# Patient Record
Sex: Male | Born: 1945 | Race: White | Hispanic: No | Marital: Single | Smoking: Current every day smoker
Health system: Southern US, Community
[De-identification: ages and names within clinical notes are randomized; demographics above are authoritative.]

## PROBLEM LIST (undated history)

## (undated) DIAGNOSIS — D649 Anemia, unspecified: Secondary | ICD-10-CM

## (undated) DIAGNOSIS — K2981 Duodenitis with bleeding: Secondary | ICD-10-CM

## (undated) DIAGNOSIS — Z923 Personal history of irradiation: Secondary | ICD-10-CM

## (undated) DIAGNOSIS — K900 Celiac disease: Secondary | ICD-10-CM

## (undated) DIAGNOSIS — R0602 Shortness of breath: Secondary | ICD-10-CM

## (undated) DIAGNOSIS — K219 Gastro-esophageal reflux disease without esophagitis: Secondary | ICD-10-CM

## (undated) DIAGNOSIS — I10 Essential (primary) hypertension: Secondary | ICD-10-CM

## (undated) DIAGNOSIS — J449 Chronic obstructive pulmonary disease, unspecified: Secondary | ICD-10-CM

## (undated) DIAGNOSIS — C801 Malignant (primary) neoplasm, unspecified: Secondary | ICD-10-CM

## (undated) DIAGNOSIS — R569 Unspecified convulsions: Secondary | ICD-10-CM

## (undated) DIAGNOSIS — R001 Bradycardia, unspecified: Secondary | ICD-10-CM

## (undated) DIAGNOSIS — I6521 Occlusion and stenosis of right carotid artery: Secondary | ICD-10-CM

## (undated) DIAGNOSIS — I209 Angina pectoris, unspecified: Secondary | ICD-10-CM

## (undated) DIAGNOSIS — I509 Heart failure, unspecified: Secondary | ICD-10-CM

## (undated) DIAGNOSIS — K269 Duodenal ulcer, unspecified as acute or chronic, without hemorrhage or perforation: Secondary | ICD-10-CM

## (undated) DIAGNOSIS — Z950 Presence of cardiac pacemaker: Secondary | ICD-10-CM

## (undated) DIAGNOSIS — R011 Cardiac murmur, unspecified: Secondary | ICD-10-CM

## (undated) HISTORY — DX: Duodenal ulcer, unspecified as acute or chronic, without hemorrhage or perforation: K26.9

## (undated) HISTORY — DX: Anemia, unspecified: D64.9

## (undated) HISTORY — PX: MOLE REMOVAL: SHX2046

## (undated) HISTORY — PX: HEMORRHOID SURGERY: SHX153

## (undated) HISTORY — PX: HERNIA REPAIR: SHX51

## (undated) HISTORY — DX: Duodenitis with bleeding: K29.81

## (undated) NOTE — *Deleted (*Deleted)
During

---

## 2000-08-13 ENCOUNTER — Encounter: Admission: RE | Admit: 2000-08-13 | Discharge: 2000-08-13 | Payer: Self-pay | Admitting: Surgery

## 2000-08-13 ENCOUNTER — Encounter: Payer: Self-pay | Admitting: Surgery

## 2000-08-15 ENCOUNTER — Ambulatory Visit (HOSPITAL_BASED_OUTPATIENT_CLINIC_OR_DEPARTMENT_OTHER): Admission: RE | Admit: 2000-08-15 | Discharge: 2000-08-15 | Payer: Self-pay | Admitting: Surgery

## 2003-07-30 ENCOUNTER — Emergency Department (HOSPITAL_COMMUNITY): Admission: EM | Admit: 2003-07-30 | Discharge: 2003-07-30 | Payer: Self-pay | Admitting: Emergency Medicine

## 2004-09-10 ENCOUNTER — Inpatient Hospital Stay (HOSPITAL_COMMUNITY): Admission: EM | Admit: 2004-09-10 | Discharge: 2004-09-12 | Payer: Self-pay | Admitting: Emergency Medicine

## 2004-09-17 ENCOUNTER — Encounter: Admission: RE | Admit: 2004-09-17 | Discharge: 2004-09-17 | Payer: Self-pay | Admitting: Family Medicine

## 2005-11-19 ENCOUNTER — Inpatient Hospital Stay (HOSPITAL_COMMUNITY): Admission: EM | Admit: 2005-11-19 | Discharge: 2005-11-21 | Payer: Self-pay | Admitting: Emergency Medicine

## 2005-11-20 ENCOUNTER — Encounter (INDEPENDENT_AMBULATORY_CARE_PROVIDER_SITE_OTHER): Payer: Self-pay | Admitting: *Deleted

## 2005-11-24 ENCOUNTER — Ambulatory Visit (HOSPITAL_COMMUNITY): Admission: RE | Admit: 2005-11-24 | Discharge: 2005-11-24 | Payer: Self-pay | Admitting: Pediatrics

## 2009-11-10 ENCOUNTER — Ambulatory Visit (HOSPITAL_COMMUNITY): Admission: RE | Admit: 2009-11-10 | Discharge: 2009-11-10 | Payer: Self-pay | Admitting: Gastroenterology

## 2009-11-15 ENCOUNTER — Encounter: Admission: RE | Admit: 2009-11-15 | Discharge: 2009-11-15 | Payer: Self-pay | Admitting: Gastroenterology

## 2010-11-26 ENCOUNTER — Encounter: Payer: Self-pay | Admitting: *Deleted

## 2011-03-24 NOTE — Op Note (Signed)
[place]  Patient:    Francisco [name], ROUDEBUSH                      MRN: [id number] Proc. Date: 08/15/00 Adm. Date:  [id number] Attending:  Cristela Blue CC:         Eulas Post, M.D.   Operative Report  DATE OF BIRTH:  1946-02-19.  CCS:  [id number]  PREOPERATIVE DIAGNOSIS:  Umbilical hernia.  POSTOPERATIVE DIAGNOSIS:  Proximate 8-9/2 cm umbilical hernia.  PROCEDURE:  Umbilical herniorrhaphy.  SURGEON:  Dr. Lucia Gaskins.  FIRST ASSISTANT:  None.  ANESTHESIA:  General LMA with approximately 20 cc of 0.25% Marcaine.  COMPLICATIONS:  None.  INDICATIONS FOR PROCEDURE:  Mr. Albea is a 65 year old white male who has a symptomatic umbilical hernia and comes for repair of this hernia.  I have instructed him on 2 things he needs to try to do:  1. He needs to try to lose weight 2. He needs to quit smoking as far as trying to permit this hernia from coming    back.  DESCRIPTION OF PROCEDURE:  The patient was placed in a supine position, given a general anesthesia using a LMA airway access.  His abdomen was prepped with Betadine solution and sterilely draped.  An infraumbilical incision was made and sharp dissection carried down identifying the umbilical hernia.  The sac was dissected of the umbilicus and sharp dissection carried down to identify about a 2-1/2 cm defect in the fascia.  The sac was invaginated, the edges freshened up, and I closed the hernia defect using interrupted 1 Novofil sutures.  I then used a 0.25% Marcaine about 20 cc as a local anesthetic, closed the subcutaneous tissues with 3-0 Vicryl, skin with a 5-0 Vicryl suture, painted the wound with tincture of benzoin and Steri-Strips.  The patient tolerated the procedure well and will be discharged home today to return to see me in 10-14 days for followup. DD:  08/15/00 TD:  08/16/00 Job: [id number]

## 2011-03-24 NOTE — H&P (Signed)
NAME:  Francisco [name], Francisco [name] NO.:  [id number]   MEDICAL RECORD NO.:  [id number]          PATIENT TYPE:  EMS   LOCATION:  MAJO                         FACILITY:  [facility]   PHYSICIAN:  Ron Parker, M.D.DATE OF BIRTH:  [birth year]   DATE OF ADMISSION:  09/10/2004  DATE OF DISCHARGE:                                HISTORY & PHYSICAL   PRIMARY CARE PHYSICIAN:  Dr. [name].   CHIEF COMPLAINT:  Right arm tingling and numbness.   PRESENT ILLNESS:  This is a 65 year old male with no consistent medical care  in the past who presents to his primary care physician's office this morning  after onset of right arm tingling that began yesterday evening.  He states  this morning that the tingling has progressed further to encompass his  entire right arm and now his left leg.  He denies any strength deficits.  He  denies any visual or auditory deficits.  No slurred speech.  He has not  fallen or had any unsteadiness to his gait.  He denies any previous history  of similar symptoms.  He states that he does not have routine medical care  and therefore was not told he has any other medical illnesses.   ALLERGIES:  None.   MEDICATIONS:  Naprosyn two tablet daily.  This is taken over-the-counter for  left heel pain.   PAST MEDICAL HISTORY:  None as he does not have routine medical care.   SOCIAL HISTORY:  He is a Curator, lives in [place], denies any illicit  drugs but has used tobacco and alcohol between 30-40 years.   FAMILY HISTORY:  His father passed away at the age of 55 with Hodgkin's  disease.  He has brothers and sisters who have no medical illnesses.  He is  not married and has no kids.   REVIEW OF SYSTEMS:  No headaches, blurred vision, auditory deficits.  He  denies any chills, cough, only in the mornings with minimal production of  sputum.  He has not seen any blood in that sputum.  No hematemesis,  hemoptysis.  He has no dyspnea on exertion.  No chest pain,  palpitations.  No orthopnea.  He denies any abdominal pain.  No problems with bowel  movements.  No blood stools or melanotic stools.  He denies any problems  with strength or joint pain except for left heel which he states broke a  long time ago.  He denies any lower extremity swelling and prior to  presentation, denies any previous symptoms of tingling or numbness.  No  dysuria and no problems and no STD's.   PHYSICAL EXAMINATION:  VITAL SIGNS:  Temperature 98.7, pulse of 85,  respirations 18, blood pressure 160/85.  GENERAL:  He is in no acute distress laying on the examining table.  HEENT:  Unremarkable.  NECK:  Supple with no masses, no JVP, no audible bruits.  CHEST:  Clear to auscultation bilaterally with good air movement.  CARDIAC:  Exam is a regular rate and rhythm.  No murmurs, rubs or gallops.  PMI is not displaced.  ABDOMEN:  Obese, nondistended,  nontender.  Positive bowel sounds.  Liver is  palpated at approximately 2-3 cm below the costal margin.  Question whether  there is a rough edge during deep palpation.  No tenderness noted.  Otherwise no organomegaly.  EXTREMITIES:  Without edema or clubbing.  Pulses are 2+ upper and lower and  are symmetric.  SKIN:  No telangiectasias.  No other rashes noted.  NEUROLOGICAL:  Cranial nerves II-XII are intact.  Sensation is diminished on  the right arm and right leg.  A two point discrimination is decreased  considerably and is at approximately 4-5 cm for discrimination.  Otherwise,  strength is intact, 5/5 and symmetric.  He is alert and oriented x3.  Reflexes are intact throughout.   LABORATORY DATA:  CT scan of head without contrast shows old lacunar infarct  in the thalamus region on the left side.  No acute changes.  Chest x-ray  shows no cardiac enlargement and clear lung fields.   White count 7.3, hemoglobin 14, platelets of 300,000.   Sodium 135, potassium 4, chloride of 106, bicarb of 22, glucose of 110, BUN  of 11,  creatinine of 0.8.  His liver function tests are normal.  PT is 12.1  and INR is 0.9.   ASSESSMENT AND PLAN:  1.  Numbness and tingling.  The patient during the exam states that his      symptoms are slowly abating from his first entering the emergency room.      Therefore, this seems more as a transient ischemic attack but will keep      him for observation in case this is happens to be a starting of a      cerebrovascular accident.  For the time being, we will control his      hypertension and aspirin and watch him closely with the every two hour      checks the first 12 hours.  Will also check homocystine level and      fasting lipid profiles and carotid Dopplers.  Given no previous medical      information and probable longstanding hypertension, he is at high risk      for a possible cardiac-related ischemic event such as an embolic event,      so therefore we will check a 2D echocardiogram and also and EKG.  No      clinical picture is that would fit a myocardial infarction therefore,      enzymes will not be checked right now.  2.  Hypertension, longstanding.  We will start him on hydrochlorothiazide      and adjust as needed.  3.  Alcohol abuse.  Will provide him with cessation counseling.  4.  Tobacco abuse.  Also provide cessation counseling.       JD/MEDQ  D:  09/10/2004  T:  09/10/2004  Job:  [id number]   cc:   Youlanda Roys. [name], M.D.  [address]  [place]  [place] [address]  Fax: [phone]

## 2011-03-24 NOTE — Discharge Summary (Signed)
NAMECARSTEN, CARSTARPHEN               ACCOUNT NO.:  [id number]   MEDICAL RECORD NO.:  [id number]          PATIENT TYPE:  INP   LOCATION:  4707                         FACILITY:  [facility]   PHYSICIAN:  Pramod P. Leonie Man, MD    DATE OF BIRTH:  [birth year]   DATE OF ADMISSION:  11/19/2005  DATE OF DISCHARGE:  11/21/2005                                 DISCHARGE SUMMARY   ADMISSION DIAGNOSIS:  Transient ischemic attack.   DISCHARGE DIAGNOSES:  1.  Right-sided progressive numbness of unclear etiology, perhaps stress      related.  2.  Hypertension.  3.  Obesity.   HOSPITAL COURSE:  Kindly see Dr. Pollyann Savoy admission H&P for details.  Mr.  Montanari is a 65 year old gentleman who was admitted with progressive right  upper extremity and lower extremity paresthesias which evolved over a period  of 30 minutes and resolve completely.  He had a similar episode in 2005.  At  that time he had an extensive workup for stroke which was all negative.  The  patient reported being under increased stress recently with the death of a  close friend a few days prior to these symptoms.  He does have vascular risk  factors in the form of hypertension, obesity and smoking and hence he was  admitted for stroke risk stratification.  Admission CT scan was  unremarkable.  Subsequently MRI scan of the brain was obtained which also  did not reveal any acute infarct.  MRA of the brain did not reveal any high-  grade stenosis. Carotid ultrasound was normal without significant stenosis.  A 2-D echocardiogram revealed no obvious cardiac source of embolism with  normal ejection fraction.  Lipid profile showed total cholesterol of 151,  triglycerides 104, HDL 57, LDL 73.  Homocystine was elevated at 17.  The  patient was advised to quit smoking and lose weight. He was advised to  participate in stress laxation activities.  He was continued on his home  medications.   DISCHARGE MEDICATIONS:  1.  Aspirin 325 mg a day.  2.   Benicar/hydrochlorothiazide 40/25 one tablet daily.   DISCHARGE INSTRUCTIONS:  He was advised to follow-up with his primary doctor  in two weeks.           ______________________________  Kathie Rhodes. Leonie Man, MD     PPS/MEDQ  D:  11/21/2005  T:  11/21/2005  Job:  [id number]

## 2011-03-24 NOTE — H&P (Signed)
[name], [name] NO.:  [id number]   MEDICAL RECORD NO.:  [id number]          PATIENT TYPE:  INP   LOCATION:  4707                         FACILITY:  [facility]   PHYSICIAN:  Francisco [name], M.D.DATE OF BIRTH:  Mar 27, 1946   DATE OF ADMISSION:  11/19/2005  DATE OF DISCHARGE:                                HISTORY & PHYSICAL   REQUESTING PHYSICIAN:  Francisco [name], M.D.   REASON FOR CONSULTATION:  Stroke/TIA.   HISTORY OF PRESENT ILLNESS:  Francisco [name] is a 65 year old Caucasian male with  past medical history of hypertension and stroke in 2005 who presents with  right-sided numbness with onset at 9:30 a.m. today.  The patient stated  symptoms started involving the right upper extremity and hand and progressed  to part of the right face and lower extremity.  The patient stated after  arriving to the emergency room, his symptoms gradually improved and he is  almost back to normal with still some residual right hand numbness.  These  are similar symptoms to the patient's prior stroke that he had back in  November 2005.  He denies any other symptoms of headaches, weakness, vision  changes, speech, hearing or swallowing problems, chewing problems,  dizziness, vertigo or loss of consciousness.   PAST MEDICAL HISTORY:  Positive for stroke in 2005 and hypertension.   CURRENT MEDICATIONS:  Benicar/hydrochlorothiazide and a baby aspirin.   ALLERGIES:  THE PATIENT HAS NO KNOWN DRUG ALLERGIES.   SOCIAL HISTORY:  The patient currently lives alone.  Smokes less than a pack  of cigarettes per day for over 40 years.  He does drink six to eight beers  per day.   FAMILY HISTORY:  Positive for Hodgkin's disease.   REVIEW OF SYSTEMS:  Positives as per HPI and anxiety.  Review of systems is  negative as per HPI and greater than eight other organ systems.   EXAMINATION:  VITALS:  Temperature is 97.6.  Blood pressure is 156/85.  Pulse is 97.  Respirations 22.  Oxygen saturation  97% on room air.  HEENT:  Normocephalic, atraumatic.  Extraocular muscles are intact.  Pupils  equal, round and reactive to light.  NECK:  Supple.  No carotid bruits.  HEART:  Regular.  LUNGS:  Clear.  ABDOMEN:  Soft, nontender.  EXTREMITIES:  No edema.  Good pulses.  NEUROLOGICAL EXAMINATION:  The patient is alert and oriented times three.  Seems slightly anxious.  Language is fluent.  Memory and knowledge are  within normal limits.  Cranial nerves II through XII are grossly intact.  Motor examination shows 5/5 strength and normal tone in all four  extremities.  No drift is noted.  On sensory examination, the patient has  slightly decreased sensation in right upper extremity, primarily of the  hand.  Otherwise, sensation is within normal limits to light touch and  pinprick.  Cerebellar function is within normal limits on finger-to-nose.  Gait is not assessed secondary to safety.   LABORATORIES:  Currently pending.  EKG showed normal sinus rhythm at 85  beats per minute.  CT scan of the head  showed no acute findings or bleed.  There is old left thalamic/globus pallidus infarct noted.   IMPRESSION:  Francisco [name] is a 65 year old Caucasian male who presented with  right-sided numbness, onset at 9:30 a.m.  His symptoms have gradually  improved.  This is possible transient ischemic attack versus a small  extension of his previous stroke.  The patient is not a candidate for IV tPA  as symptoms are greater than three hours out from onset and the patient has  improved almost back to baseline.  Will admit the patient to stroke MD  service and get an MRI/MRA of the brain, carotid Dopplers and 2D echo.  Will  also check fasting lipids and homocystine level.  Will place the patient  n.p.o. until he is cleared.  Will put him on normal saline at 100 cc per  hour.  Will get PT, OT and speech consults.  Will change his aspirin to  Aggrenox one tablet b.i.d. and continue blood pressure medication.  I  have  also ordered 1 mg of Ativan p.r.n. every six hours for anxiety and possible  withdrawal symptoms from alcohol.      Francisco [name]. Estella Husk, M.D.  Electronically Signed     DRC/MEDQ  D:  11/19/2005  T:  11/20/2005  Job:  [id number]

## 2011-03-24 NOTE — Procedures (Signed)
EEG NUMBER:  [id number].   REFERRING PHYSICIAN:  Pramod P. Leonie Man, M.D.   CLINICAL HISTORY:  A 65 year old gentleman being evaluated for possible  stroke. Medications are not listed.   EEG performed in wakeful state on a 17-channel EEG machine.   Background awake rhythm consists of 11 hertz alpha which is of moderate  amplitude, synchronous, reactive to eye opening and closure. No paroxysmal  epileptiform activity, spikes or sharp waves are seen. Definite sleep  changes are not seen in this tracing. Hyperventilation and photic  stimulation are unremarkable. Length of the tracing was 22.8 minutes.  Technical component is adequate. EKG tracing reveals regular sinus rhythm.   IMPRESSION:  This EEG performed during wakeful states is within normal  limits. No definite epileptiform activity is identified.           ______________________________  Kathie Rhodes. Leonie Man, MD     XBD:ZHGD  D:  11/24/2005 19:43:31  T:  11/25/2005 07:12:54  Job #:  [id number]

## 2011-03-24 NOTE — Discharge Summary (Signed)
Francisco [name], GOREE NO.:  [id number]   MEDICAL RECORD NO.:  [id number]          PATIENT TYPE:  INP   LOCATION:  5707                         FACILITY:  [facility]   PHYSICIAN:  Cyril Mourning, D.O.    DATE OF BIRTH:  [birth year]   DATE OF ADMISSION:  09/10/2004  DATE OF DISCHARGE:  09/12/2004                                 DISCHARGE SUMMARY   ADMISSION DIAGNOSIS:  Transient neurologic event.   DISCHARGE DIAGNOSIS:  Acute left thalamic infarct, status post MRI  evaluation that appears to be lacunar stroke involving left lateral  thalamus.  He has no residual deficit and his presenting complaint is that  of right-sided numbness and tingling that had resolved.   ADDITIONAL DIAGNOSES:  1.  Hypertension, poorly controlled.  Altace and hydrochlorothiazide      initiated this admission.  2.  Tobacco dependence.   DISCHARGE MEDICATIONS:  1.  Altace 2.5 mg daily.  This is new.  2.  Aspirin 325 mg daily.  3.  Hydrochlorothiazide 12.5 mg daily.   DISPOSITION:  The patient is instructed to have an MRA as an outpatient.  I  am attempting to schedule this while he is here in the hospital and have him  follow up.  This is to help reaffirm this is a lacunar stroke and there is  no intracranial disease that may be amenable to intervention.  He has  undergone carotid Dopplers this admission in addition to MRI of his brain  and CT scanning as well as 2-D echo.  The results of his 2-D echo are  pending; however, his carotid studies reveal no significant internal carotid  artery stenosis and vertebral artery flow was antegrade.  He underwent  fasting lipid profile that revealed an LDL of 88, cholesterol of 162 and  triglyceride of 115.  His basic metabolic panel and CBC were within normal  limits.  His homocystine level was borderline.  It was 12.98 with normal  being 5 to 13.9.  Hemoglobin A1C was 6.1.  He is instructed to follow up  with Dr. Deatra [name] at 10 a.m. tomorrow, September 13, 2004.  Again, he is being  scheduled to have an MRA as an outpatient.  Leave follow-up of response to  Altace and hydrochlorothiazide to his primary care physician.   HISTORY OF PRESENT ILLNESS:  For full details, see H&P as dictated by Dr.  Oval Linsey.  Briefly, this is a 65 year old male with no consistent medical  care in the past who presented to his primary care physician's office after  onset of right arm tingling that began the evening prior to presentation.  He stated that the morning of presentation, the tingling had progressed  further to involve his arm and his leg.  He denied any strength deficits.  He denied any slurred speech.  He denied any falls.  He had no previous  history of similar symptoms.  In the emergency department initial CT scan  revealed that of an old infarct.   HOSPITAL COURSE:  The patient was admitted and underwent risk factor  evaluation in addition to  carotid Dopplers, 2-D echo and MRI.  The MRI  revealed acute infarct as noted above, felt to be lacunar and related to his  hypertension.  He was initiated on Altace and hydrochlorothiazide and at  this time he is felt to be medically stable, has no residual neurologic  deficits.  At this time, his blood pressure appears to be controlled.  I did  speak with a neurologist, Dr. Jannifer Franklin, who provided recommendations and  ineffective was pursuing further studies including MRA  of his head to rule out vascular disease that would be amenable to  intervention.  However, at this time it is expected this is most likely  related to long-standing hypertension and small vessel disease.  At this  time, he is medically stable for discharge.  I am discharging him to home  with follow-up tomorrow with his primary care physician.       ESS/MEDQ  D:  09/12/2004  T:  09/12/2004  Job:  [id number]   cc:   Youlanda Roys. Francisco [name], M.D.  [address]  [place]   [address]  Fax: [phone]

## 2012-06-13 ENCOUNTER — Encounter (HOSPITAL_COMMUNITY): Payer: Self-pay | Admitting: Emergency Medicine

## 2012-06-13 ENCOUNTER — Inpatient Hospital Stay (HOSPITAL_COMMUNITY)
Admission: EM | Admit: 2012-06-13 | Discharge: 2012-06-14 | DRG: 812 | Disposition: A | Discharge status: Home / Self Care | Payer: Medicare HMO | Attending: Internal Medicine | Admitting: Internal Medicine

## 2012-06-13 DIAGNOSIS — I10 Essential (primary) hypertension: Secondary | ICD-10-CM | POA: Diagnosis present

## 2012-06-13 DIAGNOSIS — E871 Hypo-osmolality and hyponatremia: Secondary | ICD-10-CM | POA: Diagnosis present

## 2012-06-13 DIAGNOSIS — K900 Celiac disease: Secondary | ICD-10-CM | POA: Diagnosis present

## 2012-06-13 DIAGNOSIS — R5381 Other malaise: Secondary | ICD-10-CM

## 2012-06-13 DIAGNOSIS — D649 Anemia, unspecified: Secondary | ICD-10-CM

## 2012-06-13 DIAGNOSIS — D509 Iron deficiency anemia, unspecified: Principal | ICD-10-CM | POA: Diagnosis present

## 2012-06-13 DIAGNOSIS — R531 Weakness: Secondary | ICD-10-CM | POA: Diagnosis present

## 2012-06-13 HISTORY — DX: Essential (primary) hypertension: I10

## 2012-06-13 HISTORY — DX: Shortness of breath: R06.02

## 2012-06-13 HISTORY — DX: Hemochromatosis due to repeated red blood cell transfusions: E83.111

## 2012-06-13 HISTORY — DX: Cardiac murmur, unspecified: R01.1

## 2012-06-13 HISTORY — DX: Celiac disease: K90.0

## 2012-06-13 HISTORY — DX: Gastro-esophageal reflux disease without esophagitis: K21.9

## 2012-06-13 LAB — COMPREHENSIVE METABOLIC PANEL
ALT: 40 U/L (ref 0–53)
AST: 48 U/L — ABNORMAL HIGH (ref 0–37)
Albumin: 3.8 g/dL (ref 3.5–5.2)
Alkaline Phosphatase: 54 U/L (ref 39–117)
BUN: 14 mg/dL (ref 6–23)
CO2: 24 mEq/L (ref 19–32)
Calcium: 9.4 mg/dL (ref 8.4–10.5)
Chloride: 90 mEq/L — ABNORMAL LOW (ref 96–112)
Creatinine, Ser: 1.16 mg/dL (ref 0.50–1.35)
GFR calc Af Amer: 74 mL/min — ABNORMAL LOW (ref >90)
GFR calc non Af Amer: 64 mL/min — ABNORMAL LOW (ref >90)
Glucose, Bld: 121 mg/dL — ABNORMAL HIGH (ref 70–99)
Potassium: 3.9 mEq/L (ref 3.5–5.1)
Sodium: 126 mEq/L — ABNORMAL LOW (ref 135–145)
Total Bilirubin: 0.4 mg/dL (ref 0.3–1.2)
Total Protein: 7.1 g/dL (ref 6.0–8.3)

## 2012-06-13 LAB — CBC WITH DIFFERENTIAL/PLATELET
Basophils Absolute: 0 10*3/uL (ref 0.0–0.1)
Basophils Relative: 0 % (ref 0–1)
Eosinophils Absolute: 0.1 10*3/uL (ref 0.0–0.7)
Eosinophils Relative: 3 % (ref 0–5)
HCT: 19.8 % — ABNORMAL LOW (ref 39.0–52.0)
Hemoglobin: 6.2 g/dL — CL (ref 13.0–17.0)
Lymphocytes Relative: 8 % — ABNORMAL LOW (ref 12–46)
Lymphs Abs: 0.5 10*3/uL — ABNORMAL LOW (ref 0.7–4.0)
MCH: 21.5 pg — ABNORMAL LOW (ref 26.0–34.0)
MCHC: 31.3 g/dL (ref 30.0–36.0)
MCV: 68.5 fL — ABNORMAL LOW (ref 78.0–100.0)
Monocytes Absolute: 0.6 10*3/uL (ref 0.1–1.0)
Monocytes Relative: 11 % (ref 3–12)
Neutro Abs: 4.3 10*3/uL (ref 1.7–7.7)
Neutrophils Relative %: 78 % — ABNORMAL HIGH (ref 43–77)
Platelets: 558 10*3/uL — ABNORMAL HIGH (ref 150–400)
RBC: 2.89 MIL/uL — ABNORMAL LOW (ref 4.22–5.81)
RDW: 19.4 % — ABNORMAL HIGH (ref 11.5–15.5)
WBC: 5.6 10*3/uL (ref 4.0–10.5)

## 2012-06-13 LAB — RETICULOCYTES
RBC.: 3.34 MIL/uL — ABNORMAL LOW (ref 4.22–5.81)
Retic Count, Absolute: 46.8 10*3/uL (ref 19.0–186.0)
Retic Ct Pct: 1.4 % (ref 0.4–3.1)

## 2012-06-13 LAB — ABO/RH: ABO/RH(D): O POS

## 2012-06-13 LAB — PROTIME-INR
INR: 1.06 (ref 0.00–1.49)
Prothrombin Time: 14 seconds (ref 11.6–15.2)

## 2012-06-13 LAB — APTT: aPTT: 30 seconds (ref 24–37)

## 2012-06-13 LAB — POCT I-STAT TROPONIN I: Troponin i, poc: 0.01 ng/mL (ref 0.00–0.08)

## 2012-06-13 LAB — PREPARE RBC (CROSSMATCH)

## 2012-06-13 LAB — CALCIUM: Calcium: 9.4 mg/dL (ref 8.4–10.5)

## 2012-06-13 MED ORDER — SODIUM CHLORIDE 0.9 % IV SOLN: INTRAVENOUS | Status: DC

## 2012-06-13 MED ORDER — SODIUM CHLORIDE 0.9 % IJ SOLN: 3.0000 mL | INTRAMUSCULAR | Status: DC | PRN

## 2012-06-13 MED ORDER — AMLODIPINE BESYLATE 5 MG PO TABS
5.0000 mg | ORAL_TABLET | Freq: Every day | ORAL | Status: DC
Start: 2012-06-13 — End: 2012-06-14
  Administered 2012-06-13 – 2012-06-14 (×2): 5 mg via ORAL
  Filled 2012-06-13 (×2): qty 1

## 2012-06-13 MED ORDER — POLYETHYLENE GLYCOL 3350 17 G PO PACK
17.0000 g | PACK | Freq: Every day | ORAL | Status: DC | PRN
  Administered 2012-06-14: 17 g via ORAL
  Filled 2012-06-13 (×2): qty 1

## 2012-06-13 MED ORDER — ONDANSETRON HCL 4 MG PO TABS: 4.0000 mg | ORAL_TABLET | Freq: Four times a day (QID) | ORAL | Status: DC | PRN

## 2012-06-13 MED ORDER — ONDANSETRON HCL 4 MG/2ML IJ SOLN: 4.0000 mg | Freq: Four times a day (QID) | INTRAMUSCULAR | Status: DC | PRN

## 2012-06-13 MED ORDER — SODIUM CHLORIDE 0.9 % IV SOLN
Freq: Once | INTRAVENOUS | Status: AC
  Administered 2012-06-13: 125 mL/h via INTRAVENOUS

## 2012-06-13 MED ORDER — CYCLOBENZAPRINE HCL 10 MG PO TABS: 10.0000 mg | ORAL_TABLET | Freq: Three times a day (TID) | ORAL | Status: DC | PRN

## 2012-06-13 MED ORDER — SODIUM CHLORIDE 0.9 % IV SOLN: 250.0000 mL | INTRAVENOUS | Status: DC | PRN

## 2012-06-13 MED ORDER — SODIUM CHLORIDE 0.9 % IJ SOLN
3.0000 mL | Freq: Two times a day (BID) | INTRAMUSCULAR | Status: DC
  Administered 2012-06-14: 3 mL via INTRAVENOUS

## 2012-06-13 MED ORDER — ZOLPIDEM TARTRATE 5 MG PO TABS: 5.0000 mg | ORAL_TABLET | Freq: Every evening | ORAL | Status: DC | PRN

## 2012-06-13 NOTE — ED Notes (Signed)
Pt sent here from Dr. Dennard Schaumann d/t a low Hgb, Maudie Mercury RN from [facility] office reports pt was seen yesterday for a full cardiac work up to r/o angina, pt's lab results came back with his Hgb 6.6. Pt reports increase sob w/exertion. Pt is pale in triage. This RN spoketo Kim at [facility] and they faxed paper work to our facility, [facility] number given

## 2012-06-13 NOTE — ED Notes (Signed)
Pt brought back to room, pt undressing and getting into a gown at this time

## 2012-06-13 NOTE — ED Notes (Signed)
Critical value report by lab HGB 6.2

## 2012-06-13 NOTE — ED Notes (Signed)
Pt undressed, in gown, on monitor, continuous pulse oximetry and blood pressure cuff 

## 2012-06-13 NOTE — Progress Notes (Signed)
06/13/12  1445 Pt. Admitted to 5527 via stretcher to bed; alert and oriented x3;prior to admission feeling SOB and tired;states he feels better after 1st unit blood.  Pt. home alone and plan to return.  No problems noted.  Delories Heinz Charlee Whitebread,RN

## 2012-06-13 NOTE — ED Provider Notes (Addendum)
History     CSN: [id number]  Arrival date & time 06/13/12  0919   First MD Initiated Contact with Patient 06/13/12 [phone]      Chief Complaint  Patient presents with  . Abnormal Lab    (Consider location/radiation/quality/duration/timing/severity/associated sxs/prior treatment) The history is provided by the patient.   Patient here after being told that his hemoglobin was 6.6 from a blood draw yesterday from his doctor's office. Has noted increasing dyspnea on exertion. Denies any black or bloody stools. No vomiting blood. Notes chest discomfort when walking. Patient sent here for admission No past medical history on file.  No past surgical history on file.  No family history on file.  History  Substance Use Topics  . Smoking status: Not on file  . Smokeless tobacco: Not on file  . Alcohol Use: Not on file      Review of Systems  All other systems reviewed and are negative.    Allergies  Review of patient's allergies indicates not on file.  Home Medications  No current outpatient prescriptions on file.  BP 103/44  Pulse 72  Temp 98.3 F (36.8 C) (Oral)  Resp 16  SpO2 100%  Physical Exam  Nursing note and vitals reviewed. Constitutional: He is oriented to person, place, and time. He appears well-developed and well-nourished.  Non-toxic appearance. No distress.  HENT:  Head: Normocephalic and atraumatic.  Eyes: Conjunctivae, EOM and lids are normal. Pupils are equal, round, and reactive to light.       Conjunctiva pale  Neck: Normal range of motion. Neck supple. No tracheal deviation present. No mass present.  Cardiovascular: Normal rate, regular rhythm and normal heart sounds.  Exam reveals no gallop.   No murmur heard. Pulmonary/Chest: Effort normal and breath sounds normal. No stridor. No respiratory distress. He has no decreased breath sounds. He has no wheezes. He has no rhonchi. He has no rales.  Abdominal: Soft. Normal appearance and bowel sounds are  normal. He exhibits no distension. There is no tenderness. There is no rebound and no CVA tenderness.  Musculoskeletal: Normal range of motion. He exhibits no edema and no tenderness.  Neurological: He is alert and oriented to person, place, and time. He has normal strength. No cranial nerve deficit or sensory deficit. GCS eye subscore is 4. GCS verbal subscore is 5. GCS motor subscore is 6.  Skin: Skin is warm and dry. No abrasion and no rash noted. There is pallor.  Psychiatric: He has a normal mood and affect. His speech is normal and behavior is normal.    ED Course  Procedures (including critical care time)   Labs Reviewed  CBC WITH DIFFERENTIAL  COMPREHENSIVE METABOLIC PANEL  PROTIME-INR  APTT  TYPE AND SCREEN  PREPARE RBC (CROSSMATCH)   No results found.   No diagnosis found.    MDM   Blood transfusion ordered--pt to be admitted to medicine     Date: 06/13/2012  Rate: 68  Rhythm: normal sinus rhythm  QRS Axis: normal  Intervals: normal  ST/T Wave abnormalities: normal  Conduction Disutrbances:right bundle branch block  Narrative Interpretation:   Old EKG Reviewed: unchanged     Leota Jacobsen, MD 06/13/12 1223  Leota Jacobsen, MD 06/13/12 1227

## 2012-06-13 NOTE — ED Notes (Signed)
Gluten free meal tray ordered

## 2012-06-13 NOTE — ED Notes (Signed)
Admitted MD at bedside.

## 2012-06-13 NOTE — ED Notes (Signed)
Meal tray at bedside.  

## 2012-06-13 NOTE — H&P (Addendum)
[facility] History and Physical  MARLENE PFLUGER TDH:[id number] DOB: [birth year] DOA: 06/13/2012   PCP: Karis Juba, MD   Chief Complaint: Generalized weakness and fatigue  HPI:  66 year old male with past medical history of hypertension and celiac disease and comes in for generalized weakness. He was seen by his primary care doctor 4 days prior to admission generalized weakness and labs were done ,  he was called on the day of admission to come to the ED secondary to his hemoglobin being significantly low. He also relates some episodes of black stools 1 week prior to admission he had 3 of those and then it cleared. He denies any NSAID use. Some dyspnea on exertion  Review of Systems:  Constitutional:  No weight loss, night sweats, Fevers, chills, fatigue.  HEENT:  No headaches, Difficulty swallowing,Tooth/dental problems,Sore throat,  No sneezing, itching, ear ache, nasal congestion, post nasal drip,  Cardio-vascular:  No chest pain, Orthopnea, PND, swelling in lower extremities, anasarca, dizziness, palpitations  GI:  No heartburn, indigestion, abdominal pain, nausea, vomiting, diarrhea, change in bowel habits, loss of appetite  Resp:  No excess mucus, no productive cough, No non-productive cough, No coughing up of blood.No change in color of mucus.No wheezing.No chest wall deformity  Skin:  no rash or lesions.  GU:  no dysuria, change in color of urine, no urgency or frequency. No flank pain.  Musculoskeletal:  No joint pain or swelling. No decreased range of motion. No back pain.  Psych:  No change in mood or affect. No depression or anxiety. No memory loss.    Past Medical History  Diagnosis Date  . Celiac disease   . Hypertension    History reviewed. No pertinent past surgical history. Social History:  does not have a smoking history on file. He does not have any smokeless tobacco history on file. He reports that he drinks alcohol. His drug history not on  file.  Allergies  Allergen Reactions  . Gluten Meal Other (See Comments)    Celiac Disease    History reviewed. No pertinent family history.  Prior to Admission medications   Medication Sig Start Date End Date Taking? Authorizing Provider  amLODipine (NORVASC) 5 MG tablet Take 5 mg by mouth daily.   Yes Historical Provider, MD  cyclobenzaprine (FLEXERIL) 10 MG tablet Take 10 mg by mouth every 8 (eight) hours as needed. For muscle spasm.   Yes Historical Provider, MD  lisinopril-hydrochlorothiazide (PRINZIDE,ZESTORETIC) 20-25 MG per tablet Take 1 tablet by mouth daily.   Yes Historical Provider, MD   Physical Exam: Filed Vitals:   06/13/12 1335 06/13/12 1400 06/13/12 1443 06/13/12 1520  BP: 104/39 118/46 134/62 151/73  Pulse: 77 69 71 69  Temp: 98 F (36.7 C)  98.2 F (36.8 C) 98.1 F (36.7 C)  TempSrc: Oral  Oral Oral  Resp: 20  20 20   SpO2: 100% 100% 99% 98%   BP 151/73  Pulse 69  Temp 98.1 F (36.7 C) (Oral)  Resp 20  SpO2 98%  General Appearance:    Alert, cooperative, no distress, appears stated age  Head:    Normocephalic, without obvious abnormality, atraumatic              Neck:   Supple, symmetrical, trachea midline, no adenopathy;       thyroid:  No enlargement/tenderness/nodules; no carotid   bruit or JVD     Lungs:     Clear to auscultation bilaterally, respirations unlabored     Heart:  Regular rate and rhythm, S1 and S2 normal, no murmur, rub   or gallop  Abdomen:     Soft, non-tender, bowel sounds active all four quadrants,    no masses, no organomegaly        Extremities:   Extremities normal, atraumatic, no cyanosis or edema  Pulses:   2+ and symmetric all extremities  Skin:   Skin color, texture, turgor normal, no rashes or lesions  Lymph nodes:   Cervical, supraclavicular, and axillary nodes normal  Neurologic:   CNII-XII intact. Normal strength, sensation and reflexes      throughout    Labs on Admission:  Basic Metabolic  Panel:  Lab 18/98/42 1007  NA 126*  K 3.9  CL 90*  CO2 24  GLUCOSE 121*  BUN 14  CREATININE 1.16  CALCIUM 9.4  MG --  PHOS --   Liver Function Tests:  Lab 06/13/12 1007  AST 48*  ALT 40  ALKPHOS 54  BILITOT 0.4  PROT 7.1  ALBUMIN 3.8   No results found for this basename: LIPASE:5,AMYLASE:5 in the last 168 hours No results found for this basename: AMMONIA:5 in the last 168 hours CBC:  Lab 06/13/12 1007  WBC 5.6  NEUTROABS 4.3  HGB 6.2*  HCT 19.8*  MCV 68.5*  PLT 558*   Cardiac Enzymes: No results found for this basename: CKTOTAL:5,CKMB:5,CKMBINDEX:5,TROPONINI:5 in the last 168 hours BNP: No components found with this basename: POCBNP:5 CBG: No results found for this basename: GLUCAP:5 in the last 168 hours  Radiological Exams on Admission: No results found.  EKG: Independently reviewed. Normal sinus rhythm with right bundle branch block  Assessment/Plan: Active Problems: Generalized weakness mostly secondary to symptomatic anemia: -His MCV is low and RDW is high my guess is that this is a chronic problem, from his celiac disease as his MCV is low. There could also be a component of macrocytic anemia, from his RDW being high. At this time he really had to units of packed red blood cells transfused. We'll go ahead head and check an anemia panel also check a serum MMA. We'll definitely see her stools to make sure he is not having any occult bleed. We'll check a CBC 4 hours posttransfusion. Also get a nutrition consult for education.  Celiac disease: -Check a nutrition consult GI has been consulted.  Dehydration: -His chloride is low and he is hyponatremic this will likely secondary to decreased intravascular volume. He has been getting IV fluids normal saline at 125 for the last 46 hours. And is getting 2 units of packed red blood cells there is a good volume expander. -We'll KVO his IV fluids and recheck complete metabolic panel in the morning.  HTN  (hypertension) -Blood pressure high we'll continue Norvasc hold his diuretic and his ACE and also will check orthostatic vitals.  Hyponatremia -Most likely secondary to decreased intravascular volume. He was started on IV fluids and will get blood which is a good volume expander. We'll check a basic metabolic panel in the morning.  Time spend: Greater than 45 minutes Code Status: Full code Family Communication: Patient Disposition Plan: To be determined  Charlynne Cousins, MD  [facility] Pager [phone]  If 7PM-7AM, please contact night-coverage [email or web address] Password [email or web address] 06/13/2012, 4:11 PM

## 2012-06-13 NOTE — ED Notes (Signed)
Pt aware of plan for blood administration.  Pt given urinal.  Pt reading paper.

## 2012-06-13 NOTE — Progress Notes (Addendum)
disposition note  Francisco [name], is a 67 y.o. male,   MRN: [id number]  -  DOB - 04-15-1946  Outpatient Primary MD for the patient is Karis Juba, MD The patient's PCP is a Doren Custard, PA-C with [facility]   Blood pressure 142/54, pulse 84, temperature 97.8 F (36.6 C), temperature source Axillary, resp. rate 25, SpO2 99.00%.  Active Problems:  Anemia  Hyponatremia  Celiac disease  HTN (hypertension)   Patient had an office appointment with PCP yesterday.  He was called today and told to come to the hospital for a hgb of 6.2.    He presents to the ED and reports that a week ago he had three days of dark stools but it cleared up.  The patient has a history of Celiac and likely is not absorbing well.    Sodium is 126.  Patient is stable, eating lunch, appears pale.  Will ask for med/surg bed.  Patient receiving transfusion now.  [facility] has done an Endo/Colon in the past and the patient was clean.  I will consult them to gain their recommendations  Imogene Burn, PA-C [facility] Pager: [phone]

## 2012-06-13 NOTE — Consult Note (Signed)
Francisco [name]  Referring Provider: No ref. provider found Primary Care Physician:  Francisco Juba, MD Primary Gastroenterologist:  Dr.  Laurel Dimmer Complaint: Weakness and shortness of breath HPI: Francisco [name] is an 66 y.o. white  male  with celiac disease diagnosed after workup for iron deficiency anemia in 2011. Biopsies were consistent with celiac disease and tissue transglutaminase is greater than 100. He saw my partner Dr. Paulita Fujita who tried to arrange a nutrition consult but he never followed up. However he think he thinks he is fairly well self educated about a gluten-free diet. He went to the doctor because of dyspnea on exertion and weakness. He did [name] some dark stools for about 3 days prior to going to  his primary care physician yesterday. He denies any diarrhea nausea vomiting or weight loss or any abdominal pain. He has not had his blood count checked in a while but at the time of his original workup it was 8.7. When checked yesterday was 6.2. He has some iron at home but takes it only sporadically. He takes one aspirin a day and otherwise denies nonsteroidal anti-inflammatory drugs.  Past Medical History  Diagnosis Date  . Celiac disease   . Hypertension     History reviewed. No pertinent past surgical history.  Medications Prior to Admission  Medication Sig Dispense Refill  . amLODipine (NORVASC) 5 MG tablet Take 5 mg by mouth daily.      . cyclobenzaprine (FLEXERIL) 10 MG tablet Take 10 mg by mouth every 8 (eight) hours as needed. For muscle spasm.      Marland Kitchen lisinopril-hydrochlorothiazide (PRINZIDE,ZESTORETIC) 20-25 MG per tablet Take 1 tablet by mouth daily.        Allergies:  Allergies  Allergen Reactions  . Gluten Meal Other (See Comments)    Celiac Disease    History reviewed. No pertinent family history.  Social History:  does not have a smoking history on file. He does not have any smokeless tobacco history on file. His alcohol and drug  histories not on file.  Review of Systems: negative except as above   Blood pressure 134/62, pulse 71, temperature 98.2 F (36.8 C), temperature source Oral, resp. rate 20, SpO2 99.00%. Head: Normocephalic, without obvious abnormality, atraumatic Neck: no adenopathy, no carotid bruit, no JVD, supple, symmetrical, trachea midline and thyroid not enlarged, symmetric, no tenderness/mass/nodules Resp: clear to auscultation bilaterally Cardio: regular rate and rhythm, S1, S2 normal, no murmur, click, rub or gallop GI: Abdomen soft nondistended with normoactive bowel sounds no hepatomegaly masses or guarding Extremities: extremities normal, atraumatic, no cyanosis or edema  Results for orders placed during the hospital encounter of 06/13/12 (from the past 48 hour(s))  CBC WITH DIFFERENTIAL     Status: Abnormal   Collection Time   06/13/12 10:07 AM      Component Value Range Comment   WBC 5.6  4.0 - 10.5 K/uL    RBC 2.89 (*) 4.22 - 5.81 MIL/uL    Hemoglobin 6.2 (*) 13.0 - 17.0 g/dL    HCT 19.8 (*) 39.0 - 52.0 %    MCV 68.5 (*) 78.0 - 100.0 fL    MCH 21.5 (*) 26.0 - 34.0 pg    MCHC 31.3  30.0 - 36.0 g/dL    RDW 19.4 (*) 11.5 - 15.5 %    Platelets 558 (*) 150 - 400 K/uL    Neutrophils Relative 78 (*) 43 - 77 %    Neutro Abs 4.3  1.7 - 7.7 K/uL  Lymphocytes Relative 8 (*) 12 - 46 %    Lymphs Abs 0.5 (*) 0.7 - 4.0 K/uL    Monocytes Relative 11  3 - 12 %    Monocytes Absolute 0.6  0.1 - 1.0 K/uL    Eosinophils Relative 3  0 - 5 %    Eosinophils Absolute 0.1  0.0 - 0.7 K/uL    Basophils Relative 0  0 - 1 %    Basophils Absolute 0.0  0.0 - 0.1 K/uL   COMPREHENSIVE METABOLIC PANEL     Status: Abnormal   Collection Time   06/13/12 10:07 AM      Component Value Range Comment   Sodium 126 (*) 135 - 145 mEq/L    Potassium 3.9  3.5 - 5.1 mEq/L    Chloride 90 (*) 96 - 112 mEq/L    CO2 24  19 - 32 mEq/L    Glucose, Bld 121 (*) 70 - 99 mg/dL    BUN 14  6 - 23 mg/dL    Creatinine, Ser 1.16   0.50 - 1.35 mg/dL    Calcium 9.4  8.4 - 10.5 mg/dL    Total Protein 7.1  6.0 - 8.3 g/dL    Albumin 3.8  3.5 - 5.2 g/dL    AST 48 (*) 0 - 37 U/L    ALT 40  0 - 53 U/L    Alkaline Phosphatase 54  39 - 117 U/L    Total Bilirubin 0.4  0.3 - 1.2 mg/dL    GFR calc non Af Amer 64 (*) >90 mL/min    GFR calc Af Amer 74 (*) >90 mL/min   PROTIME-INR     Status: Normal   Collection Time   06/13/12 10:07 AM      Component Value Range Comment   Prothrombin Time 14.0  11.6 - 15.2 seconds    INR 1.06  0.00 - 1.49   APTT     Status: Normal   Collection Time   06/13/12 10:07 AM      Component Value Range Comment   aPTT 30  24 - 37 seconds   TYPE AND SCREEN     Status: Normal (Preliminary result)   Collection Time   06/13/12 10:15 AM      Component Value Range Comment   ABO/RH(D) O POS      Antibody Screen NEG      Sample Expiration 06/16/2012      Unit Number [id number]      Blood Component Type RED CELLS,LR      Unit division 00      Status of Unit ISSUED      Transfusion Status OK TO TRANSFUSE      Crossmatch Result Compatible      Unit Number [id number]      Blood Component Type RED CELLS,LR      Unit division 00      Status of Unit ISSUED      Transfusion Status OK TO TRANSFUSE      Crossmatch Result Compatible     PREPARE RBC (CROSSMATCH)     Status: Normal   Collection Time   06/13/12 10:15 AM      Component Value Range Comment   Order Confirmation ORDER PROCESSED BY BLOOD BANK     ABO/RH     Status: Normal   Collection Time   06/13/12 10:15 AM      Component Value Range Comment   ABO/RH(D) O POS  POCT I-STAT TROPONIN I     Status: Normal   Collection Time   06/13/12 10:30 AM      Component Value Range Comment   Troponin i, poc 0.01  0.00 - 0.08 ng/mL    Comment 3             No results found.  Assessment: Iron deficiency anemia suspect secondary to inadequately treated celiac disease rule out GI blood loss. Plan:  1. Obtain stool Hemoccults 2. Nutrition consult for compliance  with gluten-free diet 3. Will need continued followup to monitor for signs of malabsorption, specifically anemia. Will try to arrange this with Dr. Paulita Fujita. [name] C 06/13/2012, 3:34 PM

## 2012-06-14 DIAGNOSIS — D509 Iron deficiency anemia, unspecified: Secondary | ICD-10-CM | POA: Diagnosis present

## 2012-06-14 LAB — BASIC METABOLIC PANEL
BUN: 12 mg/dL (ref 6–23)
CO2: 26 mEq/L (ref 19–32)
Calcium: 9.7 mg/dL (ref 8.4–10.5)
Chloride: 90 mEq/L — ABNORMAL LOW (ref 96–112)
Creatinine, Ser: 1.11 mg/dL (ref 0.50–1.35)
GFR calc Af Amer: 78 mL/min — ABNORMAL LOW (ref >90)
GFR calc non Af Amer: 67 mL/min — ABNORMAL LOW (ref >90)
Glucose, Bld: 112 mg/dL — ABNORMAL HIGH (ref 70–99)
Potassium: 4 mEq/L (ref 3.5–5.1)
Sodium: 127 mEq/L — ABNORMAL LOW (ref 135–145)

## 2012-06-14 LAB — FERRITIN: Ferritin: 15 ng/mL — ABNORMAL LOW (ref 22–322)

## 2012-06-14 LAB — CBC
HCT: 25.4 % — ABNORMAL LOW (ref 39.0–52.0)
Hemoglobin: 8.2 g/dL — ABNORMAL LOW (ref 13.0–17.0)
MCH: 23.6 pg — ABNORMAL LOW (ref 26.0–34.0)
MCHC: 32.3 g/dL (ref 30.0–36.0)
MCV: 73.2 fL — ABNORMAL LOW (ref 78.0–100.0)
Platelets: 515 10*3/uL — ABNORMAL HIGH (ref 150–400)
RBC: 3.47 MIL/uL — ABNORMAL LOW (ref 4.22–5.81)
RDW: 20.9 % — ABNORMAL HIGH (ref 11.5–15.5)
WBC: 5.7 10*3/uL (ref 4.0–10.5)

## 2012-06-14 LAB — PROTIME-INR
INR: 1.04 (ref 0.00–1.49)
Prothrombin Time: 13.8 seconds (ref 11.6–15.2)

## 2012-06-14 LAB — TYPE AND SCREEN
ABO/RH(D): O POS
Antibody Screen: NEGATIVE
Unit division: 0
Unit division: 0

## 2012-06-14 LAB — IRON AND TIBC
Iron: 55 ug/dL (ref 42–135)
Saturation Ratios: 12 % — ABNORMAL LOW (ref 20–55)
TIBC: 470 ug/dL — ABNORMAL HIGH (ref 215–435)
UIBC: 415 ug/dL — ABNORMAL HIGH (ref 125–400)

## 2012-06-14 LAB — TSH: TSH: 2.712 u[IU]/mL (ref 0.350–4.500)

## 2012-06-14 LAB — FOLATE: Folate: 5 ng/mL

## 2012-06-14 LAB — VITAMIN B12: Vitamin B-12: 350 pg/mL (ref 211–911)

## 2012-06-14 LAB — OCCULT BLOOD X 1 CARD TO LAB, STOOL: Fecal Occult Bld: POSITIVE

## 2012-06-14 MED ORDER — FERROUS SULFATE 325 (65 FE) MG PO TABS
325.0000 mg | ORAL_TABLET | Freq: Three times a day (TID) | ORAL | Status: DC
  Administered 2012-06-14 (×2): 325 mg via ORAL
  Filled 2012-06-14 (×3): qty 1

## 2012-06-14 MED ORDER — SODIUM CHLORIDE 0.9 % IV SOLN
500.0000 mg | Freq: Once | INTRAVENOUS | Status: DC
  Filled 2012-06-14: qty 10

## 2012-06-14 MED ORDER — SODIUM CHLORIDE 0.9 % IV SOLN: 500.0000 mg | Freq: Once | INTRAVENOUS | Status: DC

## 2012-06-14 MED ORDER — CYANOCOBALAMIN 1000 MCG/ML IJ SOLN: 1000.0000 ug | Freq: Once | INTRAMUSCULAR | Status: DC

## 2012-06-14 MED ORDER — SODIUM CHLORIDE 0.9 % IV SOLN
25.0000 mg | Freq: Once | INTRAVENOUS | Status: AC
  Administered 2012-06-14: 25 mg via INTRAVENOUS
  Filled 2012-06-14: qty 0.5

## 2012-06-14 MED ORDER — SODIUM CHLORIDE 0.9 % IV SOLN: 25.0000 mg | Freq: Once | INTRAVENOUS | Status: DC

## 2012-06-14 MED ORDER — SODIUM CHLORIDE 0.9 % IV SOLN
25.0000 mg | Freq: Once | INTRAVENOUS | Status: DC
  Filled 2012-06-14: qty 0.5

## 2012-06-14 MED ORDER — SODIUM CHLORIDE 0.9 % IV SOLN
500.0000 mg | Freq: Once | INTRAVENOUS | Status: AC
  Administered 2012-06-14: 500 mg via INTRAVENOUS
  Filled 2012-06-14: qty 10

## 2012-06-14 MED ORDER — VITAMIN B-12 1000 MCG PO TABS
1000.0000 ug | ORAL_TABLET | Freq: Every day | ORAL | Status: DC
  Filled 2012-06-14: qty 1

## 2012-06-14 MED ORDER — CYANOCOBALAMIN 1000 MCG/ML IJ SOLN
1000.0000 ug | Freq: Once | INTRAMUSCULAR | Status: AC
  Administered 2012-06-14: 1000 ug via INTRAMUSCULAR
  Filled 2012-06-14: qty 1

## 2012-06-14 NOTE — Discharge Summary (Signed)
Physician Discharge Summary  Francisco [name] YQM:[id number] DOB: 1946-11-03 DOA: 06/13/2012  PCP: Karis Juba, MD  Admit date: 06/13/2012 Discharge date: 06/14/2012  Discharge Diagnoses:  Principal Problem:  *Generalized weakness Active Problems:  Celiac disease  HTN (hypertension)  Hyponatremia  Microcytic anemia   Discharge Condition: Stable for discharge  Disposition:  Follow-up Information    Follow up with Karis Juba, MD on 06/28/2012. (at 2pm)    Contact information:   [place] [address] [phone]          Diet: Gluten free diet Wt Readings from Last 3 Encounters:  No data found for Wt    History of present illness:  66 year old male with past medical history of hypertension and celiac disease and comes in for generalized weakness. He was seen by his primary care doctor 4 days prior to admission generalized weakness and labs were done , he was called on the day of admission to come to the ED secondary to his hemoglobin being significantly low. He also relates some episodes of black stools 1 week prior to admission he had 3 of those and then it cleared. He denies any NSAID use.  Some dyspnea on exertion   Hospital Course:  Generalized weakness/Microcytic anemia -As generalized weakness most likely secondary to severe microcytic anemia. He was given 2 units of packed red blood cells his hemoglobin was checked and it came up to 8.2 and anemia panel was drawn that showed a ferritin of 15 a B12 was 315. I have started him on B12 replacement therapy he would continue this as an outpatient. He'll continue on B12 injections daily for one week, then weekly for a month then monthly for 6 month period and repeat uric be evaluated by his GI doctor.  Celiac disease -To follow with his gastroc is an outpatient.  HTN (hypertension) Stable no changes were made.  Hyponatremia Past is mostly secondary to decreased intravascular volume  does resolve with aggressive fluid hydration and packed red blood cells transfusions.   Discharge Exam: Filed Vitals:   06/14/12 0521  BP: 146/71  Pulse: 79  Temp: 97.7 F (36.5 C)  Resp: 20   Filed Vitals:   06/13/12 1620 06/13/12 1729 06/13/12 2204 06/14/12 0521  BP: 135/73 134/70 137/75 146/71  Pulse: 67 66 67 79  Temp: 97.9 F (36.6 C) 98.2 F (36.8 C) 98.8 F (37.1 C) 97.7 F (36.5 C)  TempSrc: Oral Oral Oral Oral  Resp: 20 20 18 20   SpO2: 99% 100% 96% 100%   General: Awake alert and oriented times Cardiovascular: Regular rate and rhythm Respiratory: Good air movement clear to auscultation  Discharge Instructions  Discharge Orders    Future Orders Please Complete By Expires   Diet - low sodium heart healthy      Increase activity slowly        Medication List  As of 06/14/2012 11:14 AM   TAKE these medications         amLODipine 5 MG tablet   Commonly known as: NORVASC   Take 5 mg by mouth daily.      cyanocobalamin 1000 MCG/ML injection   Commonly known as: (VITAMIN B-12)   Inject 1 mL (1,000 mcg total) into the muscle once.      cyclobenzaprine 10 MG tablet   Commonly known as: FLEXERIL   Take 10 mg by mouth every 8 (eight) hours as needed. For muscle spasm.      lisinopril-hydrochlorothiazide 20-25  MG per tablet   Commonly known as: PRINZIDE,ZESTORETIC   Take 1 tablet by mouth daily.              The results of significant diagnostics from this hospitalization (including imaging, microbiology, ancillary and laboratory) are listed below for reference.    Significant Diagnostic Studies: No results found.  Microbiology: No results found for this or any previous visit (from the past 240 hour(s)).   Labs: Basic Metabolic Panel:  Lab 98/33/82 1948 06/13/12 1007  NA -- 126*  K -- 3.9  CL -- 90*  CO2 -- 24  GLUCOSE -- 121*  BUN -- 14  CREATININE -- 1.16  CALCIUM 9.4 9.4  MG -- --  PHOS -- --   Liver Function Tests:  Lab 06/13/12  1007  AST 48*  ALT 40  ALKPHOS 54  BILITOT 0.4  PROT 7.1  ALBUMIN 3.8   No results found for this basename: LIPASE:5,AMYLASE:5 in the last 168 hours No results found for this basename: AMMONIA:5 in the last 168 hours CBC:  Lab 06/14/12 0552 06/13/12 1007  WBC 5.7 5.6  NEUTROABS -- 4.3  HGB 8.2* 6.2*  HCT 25.4* 19.8*  MCV 73.2* 68.5*  PLT 515* 558*   Cardiac Enzymes: No results found for this basename: CKTOTAL:5,CKMB:5,CKMBINDEX:5,TROPONINI:5 in the last 168 hours BNP: No components found with this basename: POCBNP:5 CBG: No results found for this basename: GLUCAP:5 in the last 168 hours  Time coordinating discharge: 45 minutes  Signed:  FELIZ ORTIZ, [facility] 06/14/2012, 11:14 AM

## 2012-06-14 NOTE — Care Management Note (Signed)
    Page 1 of 1   06/17/2012     4:23:48 PM   CARE MANAGEMENT NOTE 06/17/2012  Patient:  Francisco [name], Francisco [name]   Account Number:  [id number]  Date Initiated:  06/14/2012  Documentation initiated by:  Tomi Bamberger  Subjective/Objective Assessment:   dx weakness, htn, celiac dz, anemia  admit- lives alone.     Action/Plan:   Anticipated DC Date:  06/15/2012   Anticipated DC Plan:  [place]  CM consult      Choice offered to / List presented to:             Status of service:  Completed, signed off Medicare Important Message given?   (If response is "NO", the following Medicare IM given date fields will be blank) Date Medicare IM given:   Date Additional Medicare IM given:    Discharge Disposition:  HOME/SELF CARE  Per UR Regulation:  Reviewed for med. necessity/level of care/duration of stay  If discussed at [place] of Stay Meetings, dates discussed:    Comments:  06/14/12 16:54 Tomi Bamberger RN, BSN [phone] patient lives alone, NCM will continue to follow for dc needs.

## 2012-06-14 NOTE — Progress Notes (Signed)
Pt. discharge to floor,verbalized understanding of discharged instruction,medication,restriction,diet and follow up appointment.Baseline Vitals sign stable,Pt comfortable,no sign and symptom of distress.

## 2012-06-14 NOTE — Plan of Care (Signed)
Problem: Food- and Nutrition-Related Knowledge Deficit (NB-1.1) Goal: Nutrition education Formal process to instruct or train a patient/client in a skill or to impart knowledge to help patients/clients voluntarily manage or modify food choices and eating behavior to maintain or improve health.  Outcome: Completed/Met Date Met:  06/14/12 RD consulted for diet education for pt with celiacs disease. Pt is very knowledgeable about this subject, has read books and other resources for how to identify gluten containing foods. Pt reports that he does not eat any gluten containing foods, reads labels, does not go out to eat and has even changed to a gluten free beer. Pt was given the "Nutrition Therapy for Celiacs Disease" hand out from the [employer].  Pt appears to be very compliant with this diet. Encouraged pt to continue to follow this diet and read labels. RD was unable to identify any foods that may be causing a reaction based on pts reported intake. Question if pt is truly as compliant as he states.  Pt's chart reviewed, no additional nutrition interventions warranted at this time. Please re-consult as needed.   Orson Slick RD, LDN Pager [phone] After Hours pager [phone]

## 2012-06-17 LAB — METHYLMALONIC ACID(MMA), RND URINE
Creatinine, Urine mg/dL-MMAURN: 17.29 mg/dL
Methylmalonic Acid, Random Urine: 0.75 mmol/mol{creat} (ref <3.60)
Methylmalonic Acid, Ur: 1.14 umol/L

## 2012-07-02 ENCOUNTER — Encounter (HOSPITAL_COMMUNITY): Payer: Self-pay | Admitting: Physical Medicine and Rehabilitation

## 2012-07-02 ENCOUNTER — Inpatient Hospital Stay (HOSPITAL_COMMUNITY)
Admission: EM | Admit: 2012-07-02 | Discharge: 2012-07-04 | DRG: 378 | Disposition: A | Discharge status: Home / Self Care | Payer: Medicare HMO | Attending: Internal Medicine | Admitting: Internal Medicine

## 2012-07-02 DIAGNOSIS — R0602 Shortness of breath: Secondary | ICD-10-CM | POA: Diagnosis present

## 2012-07-02 DIAGNOSIS — K264 Chronic or unspecified duodenal ulcer with hemorrhage: Principal | ICD-10-CM | POA: Diagnosis present

## 2012-07-02 DIAGNOSIS — K922 Gastrointestinal hemorrhage, unspecified: Secondary | ICD-10-CM

## 2012-07-02 DIAGNOSIS — D62 Acute posthemorrhagic anemia: Secondary | ICD-10-CM | POA: Diagnosis present

## 2012-07-02 DIAGNOSIS — D649 Anemia, unspecified: Secondary | ICD-10-CM

## 2012-07-02 DIAGNOSIS — K900 Celiac disease: Secondary | ICD-10-CM | POA: Diagnosis present

## 2012-07-02 DIAGNOSIS — K269 Duodenal ulcer, unspecified as acute or chronic, without hemorrhage or perforation: Secondary | ICD-10-CM

## 2012-07-02 DIAGNOSIS — K219 Gastro-esophageal reflux disease without esophagitis: Secondary | ICD-10-CM | POA: Diagnosis present

## 2012-07-02 DIAGNOSIS — K2981 Duodenitis with bleeding: Secondary | ICD-10-CM

## 2012-07-02 DIAGNOSIS — I10 Essential (primary) hypertension: Secondary | ICD-10-CM | POA: Diagnosis present

## 2012-07-02 DIAGNOSIS — E871 Hypo-osmolality and hyponatremia: Secondary | ICD-10-CM | POA: Diagnosis present

## 2012-07-02 DIAGNOSIS — D509 Iron deficiency anemia, unspecified: Secondary | ICD-10-CM

## 2012-07-02 DIAGNOSIS — R079 Chest pain, unspecified: Secondary | ICD-10-CM | POA: Diagnosis present

## 2012-07-02 DIAGNOSIS — Z87891 Personal history of nicotine dependence: Secondary | ICD-10-CM

## 2012-07-02 DIAGNOSIS — R531 Weakness: Secondary | ICD-10-CM | POA: Diagnosis present

## 2012-07-02 DIAGNOSIS — R011 Cardiac murmur, unspecified: Secondary | ICD-10-CM | POA: Insufficient documentation

## 2012-07-02 LAB — COMPREHENSIVE METABOLIC PANEL
ALT: 45 U/L (ref 0–53)
AST: 47 U/L — ABNORMAL HIGH (ref 0–37)
Albumin: 3.8 g/dL (ref 3.5–5.2)
Alkaline Phosphatase: 40 U/L (ref 39–117)
BUN: 21 mg/dL (ref 6–23)
CO2: 25 mEq/L (ref 19–32)
Calcium: 9.5 mg/dL (ref 8.4–10.5)
Chloride: 97 mEq/L (ref 96–112)
Creatinine, Ser: 1.12 mg/dL (ref 0.50–1.35)
GFR calc Af Amer: 77 mL/min — ABNORMAL LOW (ref >90)
GFR calc non Af Amer: 67 mL/min — ABNORMAL LOW (ref >90)
Glucose, Bld: 123 mg/dL — ABNORMAL HIGH (ref 70–99)
Potassium: 4 mEq/L (ref 3.5–5.1)
Sodium: 133 mEq/L — ABNORMAL LOW (ref 135–145)
Total Bilirubin: 0.2 mg/dL — ABNORMAL LOW (ref 0.3–1.2)
Total Protein: 6.8 g/dL (ref 6.0–8.3)

## 2012-07-02 LAB — CBC WITH DIFFERENTIAL/PLATELET
Basophils Absolute: 0 10*3/uL (ref 0.0–0.1)
Basophils Relative: 0 % (ref 0–1)
Eosinophils Absolute: 0 10*3/uL (ref 0.0–0.7)
Eosinophils Relative: 0 % (ref 0–5)
HCT: 15.8 % — ABNORMAL LOW (ref 39.0–52.0)
Hemoglobin: 4.9 g/dL — CL (ref 13.0–17.0)
Lymphocytes Relative: 6 % — ABNORMAL LOW (ref 12–46)
Lymphs Abs: 0.5 10*3/uL — ABNORMAL LOW (ref 0.7–4.0)
MCH: 24.5 pg — ABNORMAL LOW (ref 26.0–34.0)
MCHC: 31 g/dL (ref 30.0–36.0)
MCV: 79 fL (ref 78.0–100.0)
Monocytes Absolute: 0.6 10*3/uL (ref 0.1–1.0)
Monocytes Relative: 7 % (ref 3–12)
Neutro Abs: 6.9 10*3/uL (ref 1.7–7.7)
Neutrophils Relative %: 87 % — ABNORMAL HIGH (ref 43–77)
Platelets: 354 10*3/uL (ref 150–400)
RBC: 2 MIL/uL — ABNORMAL LOW (ref 4.22–5.81)
RDW: 22.8 % — ABNORMAL HIGH (ref 11.5–15.5)
WBC: 8 10*3/uL (ref 4.0–10.5)

## 2012-07-02 LAB — POCT I-STAT TROPONIN I: Troponin i, poc: 0.04 ng/mL (ref 0.00–0.08)

## 2012-07-02 LAB — OCCULT BLOOD, POC DEVICE: Fecal Occult Bld: NEGATIVE

## 2012-07-02 LAB — PREPARE RBC (CROSSMATCH)

## 2012-07-02 MED ORDER — SODIUM CHLORIDE 0.9 % IV SOLN
INTRAVENOUS | Status: AC
  Administered 2012-07-02: 12:00:00 via INTRAVENOUS

## 2012-07-02 MED ORDER — ACETAMINOPHEN 650 MG RE SUPP: 650.0000 mg | Freq: Four times a day (QID) | RECTAL | Status: DC | PRN

## 2012-07-02 MED ORDER — ACETAMINOPHEN 325 MG PO TABS: 650.0000 mg | ORAL_TABLET | Freq: Four times a day (QID) | ORAL | Status: DC | PRN

## 2012-07-02 MED ORDER — ALUM & MAG HYDROXIDE-SIMETH 200-200-20 MG/5ML PO SUSP: 30.0000 mL | Freq: Four times a day (QID) | ORAL | Status: DC | PRN

## 2012-07-02 MED ORDER — SODIUM CHLORIDE 0.9 % IV SOLN
INTRAVENOUS | Status: DC
  Administered 2012-07-02: 11:00:00 via INTRAVENOUS

## 2012-07-02 MED ORDER — LISINOPRIL 20 MG PO TABS
20.0000 mg | ORAL_TABLET | Freq: Every day | ORAL | Status: DC
  Administered 2012-07-02 – 2012-07-04 (×3): 20 mg via ORAL
  Filled 2012-07-02 (×3): qty 1

## 2012-07-02 MED ORDER — SODIUM CHLORIDE 0.9 % IV SOLN: INTRAVENOUS | Status: DC

## 2012-07-02 MED ORDER — ONDANSETRON HCL 4 MG/2ML IJ SOLN: 4.0000 mg | Freq: Four times a day (QID) | INTRAMUSCULAR | Status: DC | PRN

## 2012-07-02 MED ORDER — SODIUM CHLORIDE 0.9 % IJ SOLN
3.0000 mL | Freq: Two times a day (BID) | INTRAMUSCULAR | Status: DC
  Administered 2012-07-03 (×2): 3 mL via INTRAVENOUS

## 2012-07-02 MED ORDER — CYCLOBENZAPRINE HCL 10 MG PO TABS
10.0000 mg | ORAL_TABLET | Freq: Three times a day (TID) | ORAL | Status: DC | PRN
  Filled 2012-07-02: qty 1

## 2012-07-02 MED ORDER — AMLODIPINE BESYLATE 5 MG PO TABS
5.0000 mg | ORAL_TABLET | Freq: Every day | ORAL | Status: DC
  Administered 2012-07-02 – 2012-07-04 (×3): 5 mg via ORAL
  Filled 2012-07-02 (×3): qty 1

## 2012-07-02 MED ORDER — LISINOPRIL-HYDROCHLOROTHIAZIDE 20-25 MG PO TABS: 1.0000 | ORAL_TABLET | Freq: Every day | ORAL | Status: DC

## 2012-07-02 MED ORDER — HYDROCHLOROTHIAZIDE 25 MG PO TABS
25.0000 mg | ORAL_TABLET | Freq: Every day | ORAL | Status: DC
  Administered 2012-07-02 – 2012-07-04 (×3): 25 mg via ORAL
  Filled 2012-07-02 (×3): qty 1

## 2012-07-02 MED ORDER — ONDANSETRON HCL 4 MG PO TABS: 4.0000 mg | ORAL_TABLET | Freq: Four times a day (QID) | ORAL | Status: DC | PRN

## 2012-07-02 NOTE — H&P (Addendum)
History and Physical  FILMORE MOLYNEUX SAY:[id number] DOB: 05-29-1946 DOA: 07/02/2012  Referring physician: Barbara Cower, MD PCP: Karis Juba, MD   Chief Complaint: weakness  HPI:  66 year old man with history of celiac disease presented to ED with 2-3 week history of progressive SOB, DOE, chest pain on exertion (relieved with rest) and generalized weakness. No frank bleeding, but stools have been dark and tarry. Recently admitted earlier this month for microcytic anemia, transfused PRBC and seen by GI. Last colonoscopy ~5 years ago. Denies NSAIDs. No bleeding today or yesterday. Last BM about 3 days ago.  In ED was noted to be afebrile with stable vitals. Hgb 4.9. No chest pain. Referred for admission for anemia.  Chart Review:  Hospitalized 8/8-06/14/12 for microcytic anemia, received PRBC.  Review of Systems:  Negative for fever, visual changes, sore throat, rash, muscle aches, dysuria, nausea, vomiting. Appetite has been poor.  Past Medical History  Diagnosis Date  . Celiac disease   . Hypertension   . Heart murmur   . Shortness of breath   . GERD (gastroesophageal reflux disease)   . 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 Hemorrhoid surgery   . Mole removal   . Hernia repair    Social History:  reports that he quit smoking about 8 weeks ago. His smoking use included Cigarettes. He quit after 50 years of use. He has quit using smokeless tobacco. He reports that he drinks alcohol. He reports that he does not use illicit drugs.  Allergies  Allergen Reactions  . Gluten Meal Other (See Comments)    Celiac Disease   Family History  Problem Relation Age of Onset  . Hodgkin's lymphoma Father    Prior to Admission medications   Medication Sig Start Date End Date Taking? Authorizing Provider  amLODipine (NORVASC) 5 MG tablet Take 5 mg by mouth daily.   Yes Historical Provider, MD  cyclobenzaprine  (FLEXERIL) 10 MG tablet Take 10 mg by mouth every 8 (eight) hours as needed. For muscle spasm.   Yes Historical Provider, MD  lisinopril-hydrochlorothiazide (PRINZIDE,ZESTORETIC) 20-25 MG per tablet Take 1 tablet by mouth daily.   Yes Historical Provider, MD   Physical Exam: Filed Vitals:   07/02/12 0939 07/02/12 1030 07/02/12 1221  BP: 173/71 140/47 140/47  Pulse: 84 88 88  Temp: 98.1 F (36.7 C)    TempSrc: Oral    Resp: 20 20 20   SpO2: 100% 100% 100%    General:  Examined in ED. Appears calm and comfortable. Well-appearing.  Eyes: PERRL, normal lids, irises & conjunctiva  ENT: grossly normal hearing; lips and tongue appear unremarkable  Neck: no LAD, masses or thyromegaly  Cardiovascular: RRR, no m/r/g. No LE edema  Respiratory: CTA bilaterally, no w/r/r. Normal respiratory effort.  Abdomen: soft, ntnd  Skin: no rash or induration, non-tender  Musculoskeletal: grossly normal tone  Psychiatric: grossly normal mood and affect, speech fluent and appropriate  Neurologic: grossly non-focal  Labs on Admission:  Basic Metabolic Panel:  Lab 23/55/73 1040  NA 133*  K 4.0  CL 97  CO2 25  GLUCOSE 123*  BUN 21  CREATININE 1.12  CALCIUM 9.5  MG --  PHOS --   Liver Function Tests:  Lab 07/02/12 1040  AST 47*  ALT 45  ALKPHOS 40  BILITOT 0.2*  PROT 6.8  ALBUMIN 3.8   CBC:  Lab 07/02/12 1040  WBC 8.0  NEUTROABS 6.9  HGB 4.9*  HCT 15.8*  MCV 79.0  PLT 354    Basename 07/02/12 1054  TROPIPOC 0.04   Radiological Exams on Admission: No results found.  EKG: Independently reviewed. SR, RBBB (old); no acute changes   Principal Problem:  *Acute blood loss anemia Active Problems:  Chest pain on exertion  Generalized weakness  Celiac disease  Hyponatremia  Microcytic anemia  GERD (gastroesophageal reflux disease)  Shortness of breath   Assessment/Plan 1. Profound ABLA, presumed GIB--already receiving transfusion in ED. Transfuse 2 units PRBC and  recheck. No active bleeding by history, appears stable for telemetry. Etiology unclear--no NSAIDs. GI consulted. 2. Chest pain, DOE--secondary to profound anemia. POC troponin negative. No further evaluation at this time. Plan as above. 3. History of celiac disease 4. Daily alcohol use? Monitor for withdrawal. 5. Hyponatremia--mild. Monitor.  Code Status: Full code Family Communication: none at bedside Disposition Plan: home when stable  Time spent: 50 minutes  Murray Hodgkins, MD  [facility] Pager [phone] If 7PM-7AM, please contact floor/night-coverage at [email or web address], password [facility] 07/02/2012, 1:03 PM

## 2012-07-02 NOTE — ED Notes (Addendum)
Pt presents to department for evaluation of diffuse chest pain, SOB and generalized weakness. Also states black tarry stools for 6-8 weeks. Denies pain at the time. States chest pain and SOB on exertion, describes as pressure sensation. He is conscious alert and oriented x4. Pt noted to be pale. Respirations unlabored. States he was recently admitted for low hemoglobin.

## 2012-07-02 NOTE — ED Notes (Signed)
Report given to Charlett Nose, [place]. Pt to be transported to unit.

## 2012-07-02 NOTE — Progress Notes (Addendum)
Disposition Note  Francisco [name], is a 66 y.o. male,   MRN: [id number]  -  DOB - [birth year]  Outpatient Primary MD for the patient is Karis Juba, MD   Blood pressure 140/47, pulse 88, temperature 98.1 F (36.7 C), temperature source Oral, resp. rate 20, SpO2 100.00%.  Active Problems:  Generalized weakness  Microcytic anemia  Hyponatremia  Celiac disease  HTN (hypertension)  GERD (gastroesophageal reflux disease)  Shortness of breath  Chest pain on exertion   67 yo male with history of celiac disease was recently discharged (06/14/2012) - presents with chest pain and dyspnea on exertion.  He reports black stools for several weeks. Hgb today is 4.9.   He is guiac negative in the ED today, but was positive during his last hospitalization.  During his 06/2012 hospitalization he received 2 units of prbcs, IV iron and was started on B12.  Hgb at discharge was 8.2.  Per the EDP the patient appears completely stable - just pale.  I will request a telemetry bed and call [facility] for a consultation.  Imogene Burn, PA-C [facility] Pager: [phone]

## 2012-07-02 NOTE — ED Notes (Signed)
RN on floor eating lunch at the time. To call back for report.

## 2012-07-02 NOTE — ED Notes (Addendum)
EKG was not performed in triage. EKG performed when patient moved back to exam room.

## 2012-07-02 NOTE — Consult Note (Signed)
[facility] Reason for consult: GI Bleed Referring Physician: Hospitalist. PCP: Dr. Doreen Beam. Primary GI: Dr. Lazarus Gowda Francisco [name] is an 66 y.o. male.  HPI: Patient was diagnosed in 2011 with celiac disease while undergoing workup for iron deficiency. Biopsies of the small bowel felt to be classic. TTG was markedly elevated. Colonoscopy was done at that time negative other than internal hemorrhoids. The EGD was negative other than the finding of celiac disease. The patient has been feeling well and has been on a gluten-free diet. He has received dietary instructions regarding celiac disease and is on a strict gluten-free diet. He is not having diarrhea, has 4 bowel movements, has not really lost any weight. He was admitted to the hospital to 3 weeks ago with anemia and a hemoglobin of 6.2. He was discharged from gluten-free diet, vitamins, and oral iron. He had been taking iron intermittently. He reports that he had had dark stools prior to that admission and has had dark stools ever since. He felt progressively weaker and became shortness of breath on exertion with some chest pain relieved by rest. He was seen in the emergency room with a hemoglobin of 4.9 and admitted and is currently receiving packed red blood cells. The patient denies abdominal pain, dyspepsia, reflux, or need to take acid reducing medications. He denies taking any NSAIDs recently. His last exposure to NSAIDs was several months ago. He has not seen any bright red blood.  Past Medical History  Diagnosis Date  . Celiac disease   . Hypertension   . Heart murmur   . Shortness of breath   . GERD (gastroesophageal reflux disease)   . 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 Hemorrhoid surgery   . Mole removal   . Hernia repair     Family History  Problem Relation Age of Onset  . Hodgkin's lymphoma Father     Social History:   reports that he quit smoking about 8 weeks ago. His smoking use included Cigarettes. He quit after 50 years of use. He has quit using smokeless tobacco. He reports that he drinks alcohol. He reports that he does not use illicit drugs.  Allergies:  Allergies  Allergen Reactions  . Gluten Meal Other (See Comments)    Celiac Disease    Medications;    . sodium chloride   Intravenous STAT  . amLODipine  5 mg Oral Daily  . lisinopril  20 mg Oral Daily   And  . hydrochlorothiazide  25 mg Oral Daily  . sodium chloride  3 mL Intravenous Q12H  . DISCONTD: lisinopril-hydrochlorothiazide  1 tablet Oral Daily   PRN Meds acetaminophen, acetaminophen, alum & mag hydroxide-simeth, cyclobenzaprine, ondansetron (ZOFRAN) IV, ondansetron Results for orders placed during the hospital encounter of 07/02/12 (from the past 48 hour(s))  OCCULT BLOOD, POC DEVICE     Status: Normal   Collection Time   07/02/12 10:39 AM      Component Value Range Comment   Fecal Occult Bld NEGATIVE     COMPREHENSIVE METABOLIC PANEL     Status: Abnormal   Collection Time   07/02/12 10:40 AM      Component Value Range Comment   Sodium 133 (*) 135 - 145 mEq/L    Potassium 4.0  3.5 - 5.1 mEq/L    Chloride 97  96 - 112 mEq/L    CO2 25  19 - 32 mEq/L  Glucose, Bld 123 (*) 70 - 99 mg/dL    BUN 21  6 - 23 mg/dL    Creatinine, Ser 1.12  0.50 - 1.35 mg/dL    Calcium 9.5  8.4 - 10.5 mg/dL    Total Protein 6.8  6.0 - 8.3 g/dL    Albumin 3.8  3.5 - 5.2 g/dL    AST 47 (*) 0 - 37 U/L    ALT 45  0 - 53 U/L    Alkaline Phosphatase 40  39 - 117 U/L    Total Bilirubin 0.2 (*) 0.3 - 1.2 mg/dL    GFR calc non Af Amer 67 (*) >90 mL/min    GFR calc Af Amer 77 (*) >90 mL/min   CBC WITH DIFFERENTIAL     Status: Abnormal   Collection Time   07/02/12 10:40 AM      Component Value Range Comment   WBC 8.0  4.0 - 10.5 K/uL    RBC 2.00 (*) 4.22 - 5.81 MIL/uL    Hemoglobin 4.9 (*) 13.0 - 17.0 g/dL    HCT 15.8 (*) 39.0 - 52.0 %    MCV  79.0  78.0 - 100.0 fL    MCH 24.5 (*) 26.0 - 34.0 pg    MCHC 31.0  30.0 - 36.0 g/dL    RDW 22.8 (*) 11.5 - 15.5 %    Platelets 354  150 - 400 K/uL    Neutrophils Relative 87 (*) 43 - 77 %    Lymphocytes Relative 6 (*) 12 - 46 %    Monocytes Relative 7  3 - 12 %    Eosinophils Relative 0  0 - 5 %    Basophils Relative 0  0 - 1 %    Neutro Abs 6.9  1.7 - 7.7 K/uL    Lymphs Abs 0.5 (*) 0.7 - 4.0 K/uL    Monocytes Absolute 0.6  0.1 - 1.0 K/uL    Eosinophils Absolute 0.0  0.0 - 0.7 K/uL    Basophils Absolute 0.0  0.0 - 0.1 K/uL    RBC Morphology MARKED POLYCHROMASIA     POCT I-STAT TROPONIN I     Status: Normal   Collection Time   07/02/12 10:54 AM      Component Value Range Comment   Troponin i, poc 0.04  0.00 - 0.08 ng/mL    Comment 3            PREPARE RBC (CROSSMATCH)     Status: Normal   Collection Time   07/02/12 12:08 PM      Component Value Range Comment   Order Confirmation ORDER PROCESSED BY BLOOD BANK     TYPE AND SCREEN     Status: Normal (Preliminary result)   Collection Time   07/02/12 12:08 PM      Component Value Range Comment   ABO/RH(D) O POS      Antibody Screen NEG      Sample Expiration 07/05/2012      Unit Number [id number]      Blood Component Type RED CELLS,LR      Unit division 00      Status of Unit ISSUED      Transfusion Status OK TO TRANSFUSE      Crossmatch Result Compatible      Unit Number [id number]      Blood Component Type RED CELLS,LR      Unit division 00      Status of Unit ISSUED  Transfusion Status OK TO TRANSFUSE      Crossmatch Result Compatible      Unit Number [id number]      Blood Component Type RED CELLS,LR      Unit division 00      Status of Unit ALLOCATED      Transfusion Status OK TO TRANSFUSE      Crossmatch Result Compatible       No results found.              Blood pressure 129/50, pulse 85, temperature 98.1 F (36.7 C), temperature source Oral, resp. rate 18, height 5' 9"  (1.753 m), weight  93.078 kg (205 lb 3.2 oz), SpO2 100.00%.  Physical exam:   Gen.-alert white male no acute distress   eyes-sclerae are nonicteric Lungs-clear Heart-regular rate and rhythm without murmurs or gallops. Abdomen-soft and nontender  Assessment: 1. Profound anemia. It does appear that his GI bleeding although he has been on iron in this could have contributed to his dark stools. 1 stool was positive and one stool negative during the hospital. I think he needs EGD to rule out ulcers. Another possibility would be a small bowel lesion. Celiac disease is associated with small bowel lymphomas.  2.Celiac Disease. Patient claims to be on a gluten-free diet. r  Plan: We will ahead tomorrow with EGD. If no source of blood loss is seen with this procedure may need to consider repeat colonoscopy (although he just had negative colonoscopy 2 years ago ). Small bowel x-ray may also be appropriate. I discussed this with the patient he is agreeable.    [place] Dodgen JR,Maryna Yeagle L 07/02/2012, 6:00 PM

## 2012-07-02 NOTE — ED Provider Notes (Addendum)
History     CSN: [id number]  Arrival date & time 07/02/12  1478   First MD Initiated Contact with Patient 07/02/12 [phone]      Chief Complaint  Patient presents with  . Chest Pain  . Shortness of Breath    (Consider location/radiation/quality/duration/timing/severity/associated sxs/prior treatment) The history is provided by the patient.   he presents emergency department complaining of exertional chest pain, and dyspnea on exertion, along with lightheadedness for the past few weeks.  He states that he has had similar symptoms in the past, and he was admitted to the hospital for his symptoms.  He required transfusion because he was significantly anemia, with a hemoglobin in the 6 range.  He reports he had had very dark stools recently.  He denies taking any blood thinners.  He denies history of peptic ulcer disease.  He has not had coronary artery disease in the past.  He used to smoke, but does not do so.  Anymore.  He states that he drinks alcohol almost daily, but denies being an alcoholic.  He has had in the involuntary weight loss of approximately 20 pounds, as well as intermittent diaphoresis.  He states that he thinks that he is pale and that his usual complexion is [place]  Past Medical History  Diagnosis Date  . Celiac disease   . Hypertension   . Heart murmur   . Shortness of breath   . GERD (gastroesophageal reflux disease)   . 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 Hemorrhoid surgery   . Mole removal   . Hernia repair     History reviewed. No pertinent family history.  History  Substance Use Topics  . Smoking status: Former Smoker -- 50 years    Types: Cigarettes    Quit date: 05/06/2012  . Smokeless tobacco: Former Systems developer  . Alcohol Use: 0.0 oz/week    1-2 Glasses of wine per week     ocassional      Review of Systems  Constitutional: Positive for diaphoresis and unexpected weight change.  Negative for fever and chills.  HENT: Negative for nosebleeds.   Respiratory: Positive for shortness of breath. Negative for cough and chest tightness.   Cardiovascular: Positive for chest pain. Negative for leg swelling.  Gastrointestinal: Negative for nausea, vomiting, abdominal pain, diarrhea and blood in stool.  Genitourinary: Negative for hematuria.  Skin: Positive for pallor.  Neurological: Positive for light-headedness. Negative for headaches.  Hematological: Does not bruise/bleed easily.  Psychiatric/Behavioral: Negative for confusion.  All other systems reviewed and are negative.    Allergies  Gluten meal  Home Medications   Current Outpatient Rx  Name Route Sig Dispense Refill  . AMLODIPINE BESYLATE 5 MG PO TABS Oral Take 5 mg by mouth daily.    . CYCLOBENZAPRINE HCL 10 MG PO TABS Oral Take 10 mg by mouth every 8 (eight) hours as needed. For muscle spasm.    Marland Kitchen LISINOPRIL-HYDROCHLOROTHIAZIDE 20-25 MG PO TABS Oral Take 1 tablet by mouth daily.      BP 140/47  Pulse 88  Temp 98.1 F (36.7 C) (Oral)  Resp 20  SpO2 100%  Physical Exam  Nursing note and vitals reviewed. Constitutional: He is oriented to person, place, and time. He appears well-developed and well-nourished. No distress.  HENT:  Head: Normocephalic and atraumatic.  Eyes:       Pale conjunctiva  Neck: Normal range of motion. Neck supple.  Cardiovascular:  Normal rate and intact distal pulses.   Murmur heard.      Prolonged capillary refill  Pulmonary/Chest: Effort normal. No respiratory distress. He has no wheezes. He has no rales.  Abdominal: Soft. He exhibits no distension. There is no tenderness.  Genitourinary: Guaiac negative stool.  Musculoskeletal: Normal range of motion. He exhibits no edema and no tenderness.  Neurological: He is alert and oriented to person, place, and time. No cranial nerve deficit.  Skin: Skin is warm and dry. There is pallor.  Psychiatric: He has a normal mood and  affect. Thought content normal.    ED Course  Procedures (including critical care time)  67 year old, male, presents emergency department with with exertional chest pain, and shortness of breath, and lightheadedness.  He, states he's had similar symptoms in the past.  New to severe anemia, which required transfusion.  His symptoms have been present for a few weeks and he thinks that he has become pale and anemic.  Again.  We will perform laboratory testing.  An EKG, for evaluation.  He is asymptomatic while lying in bed for that is no intervention indicated at this time   Labs Reviewed  COMPREHENSIVE METABOLIC PANEL - Abnormal; Notable for the following:    Sodium 133 (*)     Glucose, Bld 123 (*)     AST 47 (*)     Total Bilirubin 0.2 (*)     GFR calc non Af Amer 67 (*)     GFR calc Af Amer 77 (*)     All other components within normal limits  CBC WITH DIFFERENTIAL - Abnormal; Notable for the following:    RBC 2.00 (*)     Hemoglobin 4.9 (*)     HCT 15.8 (*)     MCH 24.5 (*)     RDW 22.8 (*)     Neutrophils Relative 87 (*)     Lymphocytes Relative 6 (*)     Lymphs Abs 0.5 (*)     All other components within normal limits  OCCULT BLOOD, POC DEVICE  POCT I-STAT TROPONIN I  PREPARE RBC (CROSSMATCH)  TYPE AND SCREEN   No results found.   No diagnosis found.  ECG NSR at 86 BPM occ. pvc Nl axis RBBB Nonspecific tw changes   12:08 PM Spoke with [facility]. They will admit.  CRITICAL CARE Performed by: Elmer Picker   Total critical care time: 30 min  Critical care time was exclusive of separately billable procedures and treating other patients.  Critical care was necessary to treat or prevent imminent or life-threatening deterioration.  Critical care was time spent personally by me on the following activities: development of treatment plan with patient and/or surrogate as well as nursing, discussions with consultants, evaluation of patient's response to treatment,  examination of patient, obtaining history from patient or surrogate, ordering and performing treatments and interventions, ordering and review of laboratory studies, ordering and review of radiographic studies, pulse oximetry and re-evaluation of patient's condition.  MDM  Symptomatic anemia.  Heme neg today; however, he reports hx of black stools.  May be gi bleed.  Will admit for transfusion.  May need gastroenterology eval.         Barbara Cower, MD 07/02/12 [id number]  Barbara Cower, MD 07/02/12 9381  Barbara Cower, MD 07/25/12 2253

## 2012-07-02 NOTE — ED Notes (Signed)
Lab called with critical hemoglobin of 4.9, EDP aware.

## 2012-07-02 NOTE — ED Notes (Signed)
Gluten free diet tray ordered for patient.

## 2012-07-02 NOTE — ED Notes (Addendum)
Pt resting quietly at the time. Remains on cardiac monitor. Vital signs stable. Denies chest pain at present. Respirations unlabored. Pt updated on plan of care and blood administration, consent signed. Will continue to monitor closely.

## 2012-07-03 ENCOUNTER — Encounter (HOSPITAL_COMMUNITY): Payer: Self-pay | Admitting: *Deleted

## 2012-07-03 ENCOUNTER — Encounter (HOSPITAL_COMMUNITY)
Admission: EM | Disposition: A | Discharge status: Home / Self Care | Payer: Self-pay | Source: Home / Self Care | Attending: Internal Medicine

## 2012-07-03 DIAGNOSIS — K269 Duodenal ulcer, unspecified as acute or chronic, without hemorrhage or perforation: Secondary | ICD-10-CM

## 2012-07-03 DIAGNOSIS — I10 Essential (primary) hypertension: Secondary | ICD-10-CM

## 2012-07-03 DIAGNOSIS — K2981 Duodenitis with bleeding: Secondary | ICD-10-CM

## 2012-07-03 DIAGNOSIS — R5381 Other malaise: Secondary | ICD-10-CM

## 2012-07-03 DIAGNOSIS — K900 Celiac disease: Secondary | ICD-10-CM

## 2012-07-03 HISTORY — PX: ESOPHAGOGASTRODUODENOSCOPY: SHX5428

## 2012-07-03 LAB — CBC
HCT: 20.2 % — ABNORMAL LOW (ref 39.0–52.0)
HCT: 23.7 % — ABNORMAL LOW (ref 39.0–52.0)
Hemoglobin: 6.6 g/dL — CL (ref 13.0–17.0)
Hemoglobin: 7.8 g/dL — ABNORMAL LOW (ref 13.0–17.0)
MCH: 26.4 pg (ref 26.0–34.0)
MCH: 26.6 pg (ref 26.0–34.0)
MCHC: 32.7 g/dL (ref 30.0–36.0)
MCHC: 32.9 g/dL (ref 30.0–36.0)
MCV: 80.8 fL (ref 78.0–100.0)
MCV: 80.9 fL (ref 78.0–100.0)
Platelets: 308 10*3/uL (ref 150–400)
Platelets: 336 10*3/uL (ref 150–400)
RBC: 2.5 MIL/uL — ABNORMAL LOW (ref 4.22–5.81)
RBC: 2.93 MIL/uL — ABNORMAL LOW (ref 4.22–5.81)
RDW: 19.7 % — ABNORMAL HIGH (ref 11.5–15.5)
RDW: 18.9 % — ABNORMAL HIGH (ref 11.5–15.5)
WBC: 6.9 10*3/uL (ref 4.0–10.5)
WBC: 5.8 10*3/uL (ref 4.0–10.5)

## 2012-07-03 LAB — PREPARE RBC (CROSSMATCH)

## 2012-07-03 LAB — BASIC METABOLIC PANEL
BUN: 15 mg/dL (ref 6–23)
CO2: 23 mEq/L (ref 19–32)
Calcium: 9.3 mg/dL (ref 8.4–10.5)
Chloride: 97 mEq/L (ref 96–112)
Creatinine, Ser: 0.96 mg/dL (ref 0.50–1.35)
GFR calc Af Amer: >90 mL/min (ref >90)
GFR calc non Af Amer: 84 mL/min — ABNORMAL LOW (ref >90)
Glucose, Bld: 98 mg/dL (ref 70–99)
Potassium: 3.6 mEq/L (ref 3.5–5.1)
Sodium: 132 mEq/L — ABNORMAL LOW (ref 135–145)

## 2012-07-03 SURGERY — EGD (ESOPHAGOGASTRODUODENOSCOPY)
Anesthesia: Moderate Sedation

## 2012-07-03 MED ORDER — FENTANYL CITRATE 0.05 MG/ML IJ SOLN
INTRAMUSCULAR | Status: DC | PRN
  Administered 2012-07-03: 12.5 ug via INTRAVENOUS
  Administered 2012-07-03 (×2): 25 ug via INTRAVENOUS

## 2012-07-03 MED ORDER — MIDAZOLAM HCL 10 MG/2ML IJ SOLN
INTRAMUSCULAR | Status: DC | PRN
  Administered 2012-07-03 (×2): 2 mg via INTRAVENOUS
  Administered 2012-07-03 (×2): 1 mg via INTRAVENOUS

## 2012-07-03 MED ORDER — BUTAMBEN-TETRACAINE-BENZOCAINE 2-2-14 % EX AERO
INHALATION_SPRAY | CUTANEOUS | Status: DC | PRN
  Administered 2012-07-03 (×2): 1 via TOPICAL

## 2012-07-03 MED ORDER — DIPHENHYDRAMINE HCL 50 MG/ML IJ SOLN
INTRAMUSCULAR | Status: AC
  Filled 2012-07-03: qty 1

## 2012-07-03 MED ORDER — SODIUM CHLORIDE 0.9 % IV SOLN
INTRAVENOUS | Status: DC
  Administered 2012-07-03: 500 mL via INTRAVENOUS

## 2012-07-03 MED ORDER — PANTOPRAZOLE SODIUM 40 MG PO TBEC
40.0000 mg | DELAYED_RELEASE_TABLET | Freq: Two times a day (BID) | ORAL | Status: DC
  Administered 2012-07-03 – 2012-07-04 (×2): 40 mg via ORAL
  Filled 2012-07-03 (×3): qty 1

## 2012-07-03 MED ORDER — FENTANYL CITRATE 0.05 MG/ML IJ SOLN
INTRAMUSCULAR | Status: AC
  Filled 2012-07-03: qty 2

## 2012-07-03 MED ORDER — MIDAZOLAM HCL 5 MG/ML IJ SOLN
INTRAMUSCULAR | Status: AC
  Filled 2012-07-03: qty 1

## 2012-07-03 NOTE — Interval H&P Note (Signed)
History and Physical Interval Note:  07/03/2012 8:00 AM  Francisco [name]  has presented today for surgery, with the diagnosis of gi bleed  The various methods of treatment have been discussed with the patient and family. After consideration of risks, benefits and other options for treatment, the patient has consented to  Procedure(s) (LRB): ESOPHAGOGASTRODUODENOSCOPY (EGD) (N/A) as a surgical intervention .  The patient's history has been reviewed, patient examined, no change in status, stable for surgery.  I have reviewed the patient's chart and labs.  Questions were answered to the patient's satisfaction.     Retia Cordle JR,Blakley Michna L

## 2012-07-03 NOTE — Op Note (Signed)
[facility] [place], [address]   ENDOSCOPY PROCEDURE REPORT  PATIENT: Francisco [name], Francisco [name]  MR#: [id number] BIRTHDATE: September 13, 1946 , 88  yrs. old GENDER: Male  ENDOSCOPIST: Laurence Spates, MD REFERRED ME:[id number], [facility]  PROCEDURE DATE:  07/03/2012 PROCEDURE:   EGD w/ biopsy ASA CLASS:   Class II INDICATIONS:melena.  known history of celiac disease  MEDICATIONS: Fentanyl 62.5 mcg IV, Versed 6 mg IV, and Cetacaine spray x 2  TOPICAL ANESTHETIC:  DESCRIPTION OF PROCEDURE:   After the risks benefits and alternatives of the procedure were thoroughly explained, informed consent was obtained.  The Pentax Gastroscope [id number]  endoscope was introduced through the mouth and advanced to the    , limited by Without limitations.   The instrument was slowly withdrawn as the mucosa was fully examined.      FINDINGS: esophagus was normal other than esophageal ring and small hiatal hernia. Moderate gastritis of the stomach biopsied for Helicobacter and placed in jar #2. Duodenum but revealed several small ulcerations in the bulb the were not actively bleeding. The second duodenum was normal and biopsied for celiac disease and placed in jar #1  COMPLICATIONS: none  ENDOSCOPIC IMPRESSION: 1. Duodenal ulcers not actively bleeding probably the source of his melena   RECOMMENDATIONS:we'll begin PPI therapy and check biopsies for Helicobacter as well as activity of his celiac disease. Will continue on a gluten-free diet.   REPEAT EXAM:   _______________________________ Laurence Spates, MD    CC:  PATIENT NAME:  Norvell, Caswell MR#: [id number]

## 2012-07-03 NOTE — Progress Notes (Signed)
[facility]             Progress Note   Subjective: No specific complaints. Had a small, hard bowel movement today, still dark in color.  Objective: Vital signs in last 24 hours: Temp:  [97.9 F (36.6 C)-98.6 F (37 C)] 98.4 F (36.9 C) (08/28 1400) Pulse Rate:  [65-85] 69  (08/28 1400) Resp:  [12-30] 20  (08/28 1400) BP: (85-142)/(36-66) 95/58 mmHg (08/28 1400) SpO2:  [95 %-100 %] 97 % (08/28 1400) Weight change:  Last BM Date: 06/28/12  Intake/Output from previous day: 08/27 0701 - 08/28 0700 In: 2265 [P.O.:240; I.V.:250; Blood:1775] Out: 1075 [RFFMB:8466] Total I/O In: 100 [P.O.:100] Out: -    Physical Exam: General: Alert, awake, oriented x3, in no acute distress. HEENT: No bruits, no goiter. Heart: Regular rate and rhythm, without murmurs, rubs, gallops. Lungs: Clear to auscultation bilaterally. Abdomen: Soft, nontender, nondistended, positive bowel sounds. Extremities: No clubbing cyanosis or edema with positive pedal pulses. Neuro: Grossly intact, nonfocal.    Lab Results: Basic Metabolic Panel:  Basename 07/03/12 0630 07/02/12 1040  NA 132* 133*  K 3.6 4.0  CL 97 97  CO2 23 25  GLUCOSE 98 123*  BUN 15 21  CREATININE 0.96 1.12  CALCIUM 9.3 9.5  MG -- --  PHOS -- --   Liver Function Tests:  Basename 07/02/12 1040  AST 47*  ALT 45  ALKPHOS 40  BILITOT 0.2*  PROT 6.8  ALBUMIN 3.8   CBC:  Basename 07/03/12 0630 07/02/12 2040 07/02/12 1040  WBC 5.8 6.9 --  NEUTROABS -- -- 6.9  HGB 7.8* 6.6* --  HCT 23.7* 20.2* --  MCV 80.9 80.8 --  PLT 336 308 --    Studies/Results: No results found.  Medications: Scheduled Meds:   . sodium chloride   Intravenous STAT  . amLODipine  5 mg Oral Daily  . lisinopril  20 mg Oral Daily   And  . hydrochlorothiazide  25 mg Oral Daily  . pantoprazole  40 mg Oral BID AC  . sodium chloride  3 mL Intravenous Q12H  . DISCONTD: lisinopril-hydrochlorothiazide  1 tablet Oral Daily    Continuous Infusions:   . DISCONTD: sodium chloride 125 mL/hr at 07/02/12 1052  . DISCONTD: sodium chloride    . DISCONTD: sodium chloride 500 mL (07/03/12 0740)   PRN Meds:.acetaminophen, acetaminophen, alum & mag hydroxide-simeth, cyclobenzaprine, ondansetron (ZOFRAN) IV, ondansetron, DISCONTD: butamben-tetracaine-benzocaine, DISCONTD: fentaNYL, DISCONTD: midazolam  Assessment/Plan:  Principal Problem:  *Acute blood loss anemia Active Problems:  Generalized weakness  Celiac disease  Hyponatremia  Microcytic anemia  GERD (gastroesophageal reflux disease)  Shortness of breath  Chest pain on exertion  GIB (gastrointestinal bleeding)   #1 upper GI bleed: EGD consistent with multiple duodenal ulcers. This is likely the source of the bleeding. Continue PPI therapy. Appreciate GI assistance.  #2 acute blood loss anemia: Hemoglobin on admission 4.9. Up to 7.8. I will transfuse another 2 units of PRBCs today.  #3 generalized weakness: Resolved. Likely secondary to profound anemia.  #4 disposition: If hemoglobin is stable tomorrow morning consider discharge home.   LOS: 1 day   [facility] Pager: [phone] 07/03/2012, 3:32 PM

## 2012-07-04 ENCOUNTER — Encounter (HOSPITAL_COMMUNITY): Payer: Self-pay | Admitting: Gastroenterology

## 2012-07-04 LAB — BASIC METABOLIC PANEL
BUN: 10 mg/dL (ref 6–23)
CO2: 26 mEq/L (ref 19–32)
Calcium: 9.2 mg/dL (ref 8.4–10.5)
Chloride: 99 mEq/L (ref 96–112)
Creatinine, Ser: 0.95 mg/dL (ref 0.50–1.35)
GFR calc Af Amer: >90 mL/min (ref >90)
GFR calc non Af Amer: 85 mL/min — ABNORMAL LOW (ref >90)
Glucose, Bld: 95 mg/dL (ref 70–99)
Potassium: 3.7 mEq/L (ref 3.5–5.1)
Sodium: 133 mEq/L — ABNORMAL LOW (ref 135–145)

## 2012-07-04 LAB — CBC
HCT: 27.9 % — ABNORMAL LOW (ref 39.0–52.0)
HCT: 28.6 % — ABNORMAL LOW (ref 39.0–52.0)
Hemoglobin: 9.5 g/dL — ABNORMAL LOW (ref 13.0–17.0)
Hemoglobin: 9.8 g/dL — ABNORMAL LOW (ref 13.0–17.0)
MCH: 27.9 pg (ref 26.0–34.0)
MCH: 28.1 pg (ref 26.0–34.0)
MCHC: 34.1 g/dL (ref 30.0–36.0)
MCHC: 34.3 g/dL (ref 30.0–36.0)
MCV: 81.8 fL (ref 78.0–100.0)
MCV: 81.9 fL (ref 78.0–100.0)
Platelets: 249 10*3/uL (ref 150–400)
Platelets: 309 10*3/uL (ref 150–400)
RBC: 3.41 MIL/uL — ABNORMAL LOW (ref 4.22–5.81)
RBC: 3.49 MIL/uL — ABNORMAL LOW (ref 4.22–5.81)
RDW: 17.5 % — ABNORMAL HIGH (ref 11.5–15.5)
RDW: 17.5 % — ABNORMAL HIGH (ref 11.5–15.5)
WBC: 5.7 10*3/uL (ref 4.0–10.5)
WBC: 8.3 10*3/uL (ref 4.0–10.5)

## 2012-07-04 LAB — TYPE AND SCREEN
ABO/RH(D): O POS
Antibody Screen: NEGATIVE
Unit division: 0
Unit division: 0
Unit division: 0
Unit division: 0
Unit division: 0

## 2012-07-04 MED ORDER — PANTOPRAZOLE SODIUM 40 MG PO TBEC: 40.0000 mg | DELAYED_RELEASE_TABLET | Freq: Two times a day (BID) | ORAL | Status: DC

## 2012-07-04 NOTE — Discharge Summary (Signed)
Physician Discharge Summary  Patient ID: Francisco [name] MRN: [id number] DOB/AGE: Jul 25, 1946 66 y.o.  Admit date: 07/02/2012 Discharge date: 07/04/2012  Primary Care Physician:  Karis Juba, MD   Discharge Diagnoses:    Principal Problem:  *Acute blood loss anemia Active Problems:  Generalized weakness  Celiac disease  Hyponatremia  Microcytic anemia  GERD (gastroesophageal reflux disease)  Shortness of breath  Chest pain on exertion  GIB (gastrointestinal bleeding)  Multiple duodenal ulcers    Medication List  As of 07/04/2012  9:21 AM   TAKE these medications         amLODipine 5 MG tablet   Commonly known as: NORVASC   Take 5 mg by mouth daily.      cyclobenzaprine 10 MG tablet   Commonly known as: FLEXERIL   Take 10 mg by mouth every 8 (eight) hours as needed. For muscle spasm.      lisinopril-hydrochlorothiazide 20-25 MG per tablet   Commonly known as: PRINZIDE,ZESTORETIC   Take 1 tablet by mouth daily.      pantoprazole 40 MG tablet   Commonly known as: PROTONIX   Take 1 tablet (40 mg total) by mouth 2 (two) times daily before a meal.             Disposition and Follow-up:  Will be discharged home today. Will need to followup with his PCP in 2 weeks for Hb check.  Consults:  GI Dr. Oletta Lamas   Significant Diagnostic Studies:  No results found.  Brief H and P: For complete details please refer to admission H and P, but in brief patient is a 66 year old man with history of celiac disease presented to ED with 2-3 week history of progressive SOB, DOE, chest pain on exertion (relieved with rest) and generalized weakness. No frank bleeding, but stools have been dark and tarry. Recently admitted earlier this month for microcytic anemia, transfused PRBC and seen by GI. Last colonoscopy ~5 years ago. Denies NSAIDs. No bleeding today or yesterday. Last BM about 3 days ago.  In ED was noted to be afebrile with stable vitals. Hgb 4.9. We were asked to admit  him for further evaluation and management.     Hospital Course:  Principal Problem:  *Acute blood loss anemia Active Problems:  Generalized weakness  Celiac disease  Hyponatremia  Microcytic anemia  GERD (gastroesophageal reflux disease)  Shortness of breath  Chest pain on exertion  GIB (gastrointestinal bleeding)  Multiple duodenal ulcers   #1 ABLA: 2/2 GI bleed. Received a total of 5 units of PRBCs this admission. Hb went from 4.9 on admission to 9.9 on discharge. Hb has been stable for 24 hours. Has not reported any further dark stools.  #2 Upper GI Bleed: 2/2 multiple duodenal ulcers. Continue BID PPI. Followup with PCP in 10 days for Hb check.  Rest of chronic medical issues have been stable this hospitalization and his home medications have not been altered.  Time spent on Discharge: Greater than 30 minutes.  SignedLelon Frohlich [facility] Pager: [phone] 07/04/2012, 9:21 AM

## 2012-07-04 NOTE — Progress Notes (Signed)
DC orders received.  Patient stable with no S/S of distress.  Medication and discharge instructions reviewed with patient as well as attached handouts on celiac disease and anemia.  Patient DC home. Lina Sar

## 2012-08-16 ENCOUNTER — Ambulatory Visit (HOSPITAL_COMMUNITY): Payer: Medicare HMO | Attending: Cardiovascular Disease | Admitting: Radiology

## 2012-08-16 ENCOUNTER — Other Ambulatory Visit (HOSPITAL_COMMUNITY): Payer: Self-pay | Admitting: Family Medicine

## 2012-08-16 ENCOUNTER — Other Ambulatory Visit: Payer: Self-pay

## 2012-08-16 DIAGNOSIS — R011 Cardiac murmur, unspecified: Secondary | ICD-10-CM

## 2012-08-16 DIAGNOSIS — F17200 Nicotine dependence, unspecified, uncomplicated: Secondary | ICD-10-CM | POA: Insufficient documentation

## 2012-08-16 DIAGNOSIS — I379 Nonrheumatic pulmonary valve disorder, unspecified: Secondary | ICD-10-CM | POA: Insufficient documentation

## 2012-08-16 DIAGNOSIS — I10 Essential (primary) hypertension: Secondary | ICD-10-CM | POA: Insufficient documentation

## 2012-08-16 DIAGNOSIS — I080 Rheumatic disorders of both mitral and aortic valves: Secondary | ICD-10-CM | POA: Insufficient documentation

## 2012-08-16 DIAGNOSIS — I369 Nonrheumatic tricuspid valve disorder, unspecified: Secondary | ICD-10-CM | POA: Insufficient documentation

## 2012-08-16 NOTE — Progress Notes (Signed)
Echocardiogram performed.  

## 2012-08-19 ENCOUNTER — Other Ambulatory Visit: Payer: Self-pay | Admitting: Cardiology

## 2012-08-19 ENCOUNTER — Encounter (HOSPITAL_COMMUNITY): Payer: Self-pay | Admitting: Family Medicine

## 2012-08-19 DIAGNOSIS — R0989 Other specified symptoms and signs involving the circulatory and respiratory systems: Secondary | ICD-10-CM

## 2012-08-20 ENCOUNTER — Encounter (INDEPENDENT_AMBULATORY_CARE_PROVIDER_SITE_OTHER): Payer: Medicare HMO

## 2012-08-20 DIAGNOSIS — I6529 Occlusion and stenosis of unspecified carotid artery: Secondary | ICD-10-CM

## 2012-08-20 DIAGNOSIS — R0989 Other specified symptoms and signs involving the circulatory and respiratory systems: Secondary | ICD-10-CM

## 2013-02-07 ENCOUNTER — Telehealth: Payer: Self-pay | Admitting: Physician Assistant

## 2013-02-07 DIAGNOSIS — I10 Essential (primary) hypertension: Secondary | ICD-10-CM

## 2013-02-07 DIAGNOSIS — K219 Gastro-esophageal reflux disease without esophagitis: Secondary | ICD-10-CM

## 2013-02-07 MED ORDER — AMLODIPINE BESYLATE 5 MG PO TABS: 5.0000 mg | ORAL_TABLET | Freq: Every day | ORAL | Status: DC

## 2013-02-07 MED ORDER — LISINOPRIL-HYDROCHLOROTHIAZIDE 20-25 MG PO TABS: 1.0000 | ORAL_TABLET | Freq: Every day | ORAL | Status: DC

## 2013-02-07 MED ORDER — PANTOPRAZOLE SODIUM 40 MG PO TBEC: 40.0000 mg | DELAYED_RELEASE_TABLET | Freq: Two times a day (BID) | ORAL | Status: DC

## 2013-02-07 NOTE — Telephone Encounter (Signed)
Medication refilled per protocol.Patient needs to be seen before any further refills

## 2013-03-05 ENCOUNTER — Encounter: Payer: Self-pay | Admitting: Physician Assistant

## 2013-03-05 ENCOUNTER — Ambulatory Visit (INDEPENDENT_AMBULATORY_CARE_PROVIDER_SITE_OTHER): Payer: Medicare HMO | Admitting: Physician Assistant

## 2013-03-05 VITALS — BP 122/70 | HR 80 | Temp 97.1°F | Resp 18 | Ht 67.25 in | Wt 224.0 lb

## 2013-03-05 DIAGNOSIS — D649 Anemia, unspecified: Secondary | ICD-10-CM | POA: Insufficient documentation

## 2013-03-05 DIAGNOSIS — D62 Acute posthemorrhagic anemia: Secondary | ICD-10-CM

## 2013-03-05 DIAGNOSIS — R011 Cardiac murmur, unspecified: Secondary | ICD-10-CM

## 2013-03-05 DIAGNOSIS — K900 Celiac disease: Secondary | ICD-10-CM

## 2013-03-05 DIAGNOSIS — K269 Duodenal ulcer, unspecified as acute or chronic, without hemorrhage or perforation: Secondary | ICD-10-CM

## 2013-03-05 DIAGNOSIS — D485 Neoplasm of uncertain behavior of skin: Secondary | ICD-10-CM

## 2013-03-05 DIAGNOSIS — K2981 Duodenitis with bleeding: Secondary | ICD-10-CM

## 2013-03-05 DIAGNOSIS — K219 Gastro-esophageal reflux disease without esophagitis: Secondary | ICD-10-CM

## 2013-03-05 DIAGNOSIS — D229 Melanocytic nevi, unspecified: Secondary | ICD-10-CM

## 2013-03-05 DIAGNOSIS — L570 Actinic keratosis: Secondary | ICD-10-CM

## 2013-03-05 DIAGNOSIS — I10 Essential (primary) hypertension: Secondary | ICD-10-CM

## 2013-03-05 LAB — COMPLETE METABOLIC PANEL WITH GFR
ALT: 28 U/L (ref 0–53)
AST: 50 U/L — ABNORMAL HIGH (ref 0–37)
Albumin: 4.4 g/dL (ref 3.5–5.2)
Alkaline Phosphatase: 51 U/L (ref 39–117)
BUN: 13 mg/dL (ref 6–23)
CO2: 28 mEq/L (ref 19–32)
Calcium: 10.1 mg/dL (ref 8.4–10.5)
Chloride: 91 mEq/L — ABNORMAL LOW (ref 96–112)
Creat: 1.14 mg/dL (ref 0.50–1.35)
GFR, Est African American: 77 mL/min
GFR, Est Non African American: 66 mL/min
Glucose, Bld: 116 mg/dL — ABNORMAL HIGH (ref 70–99)
Potassium: 4.8 mEq/L (ref 3.5–5.3)
Sodium: 131 mEq/L — ABNORMAL LOW (ref 135–145)
Total Bilirubin: 0.7 mg/dL (ref 0.3–1.2)
Total Protein: 7.9 g/dL (ref 6.0–8.3)

## 2013-03-05 LAB — CBC WITH DIFFERENTIAL/PLATELET
Basophils Absolute: 0 10*3/uL (ref 0.0–0.1)
Basophils Relative: 0 % (ref 0–1)
Eosinophils Absolute: 0.1 10*3/uL (ref 0.0–0.7)
Eosinophils Relative: 1 % (ref 0–5)
HCT: 39.9 % (ref 39.0–52.0)
Hemoglobin: 13.8 g/dL (ref 13.0–17.0)
Lymphocytes Relative: 12 % (ref 12–46)
Lymphs Abs: 1 10*3/uL (ref 0.7–4.0)
MCH: 29.6 pg (ref 26.0–34.0)
MCHC: 34.6 g/dL (ref 30.0–36.0)
MCV: 85.4 fL (ref 78.0–100.0)
Monocytes Absolute: 0.8 10*3/uL (ref 0.1–1.0)
Monocytes Relative: 9 % (ref 3–12)
Neutro Abs: 6.7 10*3/uL (ref 1.7–7.7)
Neutrophils Relative %: 78 % — ABNORMAL HIGH (ref 43–77)
Platelets: 289 10*3/uL (ref 150–400)
RBC: 4.67 MIL/uL (ref 4.22–5.81)
RDW: 14.9 % (ref 11.5–15.5)
WBC: 8.6 10*3/uL (ref 4.0–10.5)

## 2013-03-05 MED ORDER — PANTOPRAZOLE SODIUM 40 MG PO TBEC: 40.0000 mg | DELAYED_RELEASE_TABLET | Freq: Two times a day (BID) | ORAL | Status: DC

## 2013-03-05 MED ORDER — LISINOPRIL-HYDROCHLOROTHIAZIDE 20-25 MG PO TABS: 1.0000 | ORAL_TABLET | Freq: Every day | ORAL | Status: DC

## 2013-03-05 MED ORDER — AMLODIPINE BESYLATE 5 MG PO TABS: 5.0000 mg | ORAL_TABLET | Freq: Every day | ORAL | Status: DC

## 2013-03-06 NOTE — Progress Notes (Signed)
Patient ID: Francisco [name] MRN: [id number], DOB: 18-Nov-1945, 67 y.o. Date of Encounter: @DATE @  Chief Complaint:  Chief Complaint  Patient presents with  . Medication Refill    not fasting  . suspicious lesions on leg,arm,back  wants checked    HPI: 67 y.o. year old male  presents for routine f/u. Also has noticed some skin lesions.    1- HTN: Taking meds every day. Has not had BP checked any where else.   2-H/o Anemia sec to duodenal ulcers: Denies melena, hematochezia. No epigastric/abdominal pain. No SOB, increase fatigue, etc.  3- on Gluten free diet for celiac disease.     Past Medical History  Diagnosis Date  . Celiac disease   . Hypertension   . Heart murmur   . Shortness of breath   . GERD (gastroesophageal reflux disease)   . 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 Anemia   . Multiple duodenal ulcers      Home Meds:See attached med section for meds entered today. On Protonix. Current Outpatient Prescriptions on File Prior to Visit  Medication Sig Dispense Refill  . cyclobenzaprine (FLEXERIL) 10 MG tablet Take 10 mg by mouth every 8 (eight) hours as needed. For muscle spasm.       No current facility-administered medications on file prior to visit.    Allergies:  Allergies  Allergen Reactions  . Gluten Meal Other (See Comments)    Celiac Disease    History   Social History  . Marital Status: Married    Spouse Name: N/A    Number of Children: N/A  . Years of Education: N/A   Occupational History  . Not on file.   Social History Main Topics  . Smoking status: Former Smoker -- 50 years    Types: Cigarettes    Quit date: 05/06/2012  . Smokeless tobacco: Former Systems developer  . Alcohol Use: 0.0 oz/week    1-2 Glasses of wine per week     Comment: ocassional  . Drug Use: No  . Sexually Active: No   Other Topics Concern  . Not on file   Social History Narrative  . No narrative on file    Family History  Problem  Relation Age of Onset  . Hodgkin's lymphoma Father      Review of Systems: Constitutional: negative for chills, fever, night sweats, weight changes, or fatigue  HEENT: negative for vision changes, hearing loss, congestion, rhinorrhea, ST, epistaxis, or sinus pressure Cardiovascular: negative for chest pain or palpitations. No new/increased shortness of breath or dyspnea on exertion Respiratory: negative for hemoptysis, wheezing, shortness of breath, or cough Abdominal: negative for abdominal pain, nausea, vomiting, diarrhea, or constipation Dermatological: negative for rash or concerning skin lesions Neurologic: negative for headache, dizziness, or syncope All other systems reviewed and are otherwise negative with the exception to those above and in the HPI.   Physical Exam: Blood pressure 122/70, pulse 80, temperature 97.1 F (36.2 C), temperature source Oral, resp. rate 18, height 5' 7.25" (1.708 m), weight 224 lb (101.606 kg)., Body mass index is 34.83 kg/(m^2). General: Well developed, well nourished,WM. Appears in no acute distress. Neck: Supple. No thyromegaly. Full ROM. No lymphadenopathy. Lungs: Clear bilaterally to auscultation without wheezes, rales, or rhonchi. Breathing is unlabored. Heart: RRR with S1 S2. III/VI murmur at 2nd ICS. IV/VI murmur at apex. Abdomen: Soft, non-tender, non-distended with normoactive bowel sounds. No hepatomegaly. No rebound/guarding. No obvious abdominal masses. Musculoskeletal:  Strength and tone normal for age.  Extremities/Skin: Warm and dry. No clubbing or cyanosis. No edema.  Right Lower Leg: 1 cm papule-pink with scale.  Upper back: one lesion c/w Seborrheic Keraotsis. Another lesion is a 1/2 cm papule that is brown- c/w suspicious nevus.  Left Forearm: 1/2 cm rea of erythema and scale.  Neuro: Alert and oriented X 3. Moves all extremities spontaneously. Gait is normal. CNII-XII grossly in tact. Psych:  Responds to questions appropriately with  a normal affect.    ASSESSMENT AND PLAN:  67 y.o. year old male with  1. H/O Acute blood loss anemia-06/2012: EGD showed multiple Duodenal Ulcers Check CBC to f/u - CBC with Differential  2. Multiple duodenal ulcers - CBC with Differential - pantoprazole (PROTONIX) 40 MG tablet; Take 1 tablet (40 mg total) by mouth 2 (two) times daily before a meal.  Dispense: 180 tablet; Refill: 1  3. Celiac disease Gluten free diet  4. GERD (gastroesophageal reflux disease) - pantoprazole (PROTONIX) 40 MG tablet; Take 1 tablet (40 mg total) by mouth 2 (two) times daily before a meal.  Dispense: 180 tablet; Refill: 1  5. Hypertension At goal on current meds. Cont current meds, check labs - COMPLETE METABOLIC PANEL WITH GFR  6. 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   0-39% stenosis on left Had Echo 08/16/2012: Mod LVH. EF 60-65%. Mild Aortic Stenosis. Trivial AI. Mild Mitral regurg.  No signif abnormalities.   7. Actinic keratosis RTC for cryotherapy  8. Suspicious nevus RTC for biopsy/excision  9. HTN (hypertension) - amLODipine (NORVASC) 5 MG tablet; Take 1 tablet (5 mg total) by mouth daily.  Dispense: 90 tablet; Refill: 1 - lisinopril-hydrochlorothiazide (PRINZIDE,ZESTORETIC) 20-25 MG per tablet; Take 1 tablet by mouth daily.  Dispense: 90 tablet; Refill: 1  10. Screening PSA: Last was 06/13/2012  11. H/O Elevated LFTS-had negative Hepatitis panel 05/31/2012  12. Screening Colonoscopy: - Sees GI routinely (Celiac dz, duodenal ulcers)  Signed, [address] [place], [place], [facility] 03/06/2013 2:02 PM

## 2013-03-19 ENCOUNTER — Encounter: Payer: Self-pay | Admitting: Physician Assistant

## 2013-03-19 ENCOUNTER — Ambulatory Visit (INDEPENDENT_AMBULATORY_CARE_PROVIDER_SITE_OTHER): Payer: Medicare HMO | Admitting: Physician Assistant

## 2013-03-19 VITALS — BP 156/78 | HR 80 | Temp 97.3°F | Resp 18 | Wt 224.0 lb

## 2013-03-19 DIAGNOSIS — D229 Melanocytic nevi, unspecified: Secondary | ICD-10-CM

## 2013-03-19 DIAGNOSIS — L570 Actinic keratosis: Secondary | ICD-10-CM | POA: Diagnosis not present

## 2013-03-19 DIAGNOSIS — L989 Disorder of the skin and subcutaneous tissue, unspecified: Secondary | ICD-10-CM

## 2013-03-19 NOTE — Progress Notes (Signed)
Patient ID: Francisco [name] MRN: [id number], DOB: [birth year], 67 y.o. Date of Encounter: 03/19/2013, 10:15 AM    Chief Complaint:  Chief Complaint  Patient presents with  . right ankle injury  . check suspicious lesions on body     HPI: 67 y.o. year old male here to f/u, evaluate skin lesions that are precancerous and others that are suspicious for malignancy.     Home Meds: See attached medication section for any medications that were entered at today's visit. The computer does not put those onto this list.The following list is a list of meds entered prior to today's visit.   Current Outpatient Prescriptions on File Prior to Visit  Medication Sig Dispense Refill  . amLODipine (NORVASC) 5 MG tablet Take 1 tablet (5 mg total) by mouth daily.  90 tablet  1  . cyclobenzaprine (FLEXERIL) 10 MG tablet Take 10 mg by mouth every 8 (eight) hours as needed. For muscle spasm.      Marland Kitchen lisinopril-hydrochlorothiazide (PRINZIDE,ZESTORETIC) 20-25 MG per tablet Take 1 tablet by mouth daily.  90 tablet  1  . pantoprazole (PROTONIX) 40 MG tablet Take 1 tablet (40 mg total) by mouth 2 (two) times daily before a meal.  180 tablet  1   No current facility-administered medications on file prior to visit.    Allergies:  Allergies  Allergen Reactions  . Gluten Meal Other (See Comments)    Celiac Disease      Review of Systems: See HPI for pertinent ROS. All other ROS negative.    Physical Exam: Blood pressure 156/78, pulse 80, temperature 97.3 F (36.3 C), temperature source Oral, resp. rate 18, weight 224 lb (101.606 kg)., Body mass index is 34.83 kg/(m^2). General:  WM. Appears in no acute distress. SKIN:         Left Forearm: There are 3 sites-Each is approx 1 cm in diameter. All of these                                   Sites are slightly raised with erythema and overlying scale.                                         Rough to the touch.        Right Upper Arm, Lateral: one site-approx 1  cm in diameter. Slightly raised                              with erythema and overlying scale. Rough to the touch.        Right Jaw, Just inferior to ear lobe; one site-approx 1 cm diameter. Slightly                        raised with erythema and overlying scale. Rough ro touch.  BACK:  Lesion #1 ; Lateral upper back:   Approx 1.0 cm diameter raised lesion. Most of the lesion is color of other skin but there are "specks" of brown pigment.   Lesion # 2: Medial upper back: Approx. 1 cm diameter raised lesion. Most of lesion is brown color but with some areas of darker (dark brown) pigment.       ASSESSMENT AND PLAN:  67 y.o. year  old male with  1. Actinic keratosis x 5 Cryotherapy applied to the 5 sites that are consistent with Actinic Keratosis.  4 freeze-thaw cycles applied to each site.  2. Suspicious nevus x2  Each site was cleansed with betadine and alcohol.  Each site then anaesthesized with Epi-Lidocaine.  Shave biopsy then performed to each lesion.  There was minimal bleeding. Pressure was applied. Drysol was applied. Bleeding ceased. Sites clean, dry.  Will f/u pathology reports.  - Pathology Report - Pathology Report  I recommended that he see a Dermatologist for full body skin check. Offered referral but he defers.   Signed, [address] [place], [place], [facility] 03/19/2013 10:15 AM

## 2013-03-21 LAB — PATHOLOGY

## 2013-04-02 ENCOUNTER — Encounter: Payer: Self-pay | Admitting: Family Medicine

## 2013-09-08 ENCOUNTER — Encounter: Payer: Self-pay | Admitting: Family Medicine

## 2013-09-08 ENCOUNTER — Other Ambulatory Visit: Payer: Self-pay | Admitting: Physician Assistant

## 2013-09-08 NOTE — Telephone Encounter (Signed)
Medication refill for one time only. (90 day)  Patient needs to be seen.  Letter sent for patient to call and schedule

## 2013-11-13 ENCOUNTER — Encounter: Payer: Self-pay | Admitting: Physician Assistant

## 2013-11-13 ENCOUNTER — Ambulatory Visit (INDEPENDENT_AMBULATORY_CARE_PROVIDER_SITE_OTHER): Payer: Medicare HMO | Admitting: Physician Assistant

## 2013-11-13 ENCOUNTER — Other Ambulatory Visit: Payer: Self-pay | Admitting: Physician Assistant

## 2013-11-13 VITALS — BP 154/76 | HR 76 | Temp 98.4°F | Resp 20 | Ht 67.5 in | Wt 236.0 lb

## 2013-11-13 DIAGNOSIS — K269 Duodenal ulcer, unspecified as acute or chronic, without hemorrhage or perforation: Secondary | ICD-10-CM

## 2013-11-13 DIAGNOSIS — R0989 Other specified symptoms and signs involving the circulatory and respiratory systems: Secondary | ICD-10-CM

## 2013-11-13 DIAGNOSIS — K900 Celiac disease: Secondary | ICD-10-CM

## 2013-11-13 DIAGNOSIS — I10 Essential (primary) hypertension: Secondary | ICD-10-CM

## 2013-11-13 DIAGNOSIS — D62 Acute posthemorrhagic anemia: Secondary | ICD-10-CM

## 2013-11-13 DIAGNOSIS — K2981 Duodenitis with bleeding: Secondary | ICD-10-CM

## 2013-11-13 DIAGNOSIS — Z0000 Encounter for general adult medical examination without abnormal findings: Secondary | ICD-10-CM

## 2013-11-13 DIAGNOSIS — Z23 Encounter for immunization: Secondary | ICD-10-CM

## 2013-11-13 DIAGNOSIS — R011 Cardiac murmur, unspecified: Secondary | ICD-10-CM

## 2013-11-13 LAB — LIPID PANEL
Cholesterol: 222 mg/dL — ABNORMAL HIGH (ref 0–200)
HDL: 80 mg/dL (ref >39)
LDL Cholesterol: 121 mg/dL — ABNORMAL HIGH (ref 0–99)
Total CHOL/HDL Ratio: 2.8 Ratio
Triglycerides: 103 mg/dL (ref <150)
VLDL: 21 mg/dL (ref 0–40)

## 2013-11-13 LAB — CBC WITH DIFFERENTIAL/PLATELET
Basophils Absolute: 0 10*3/uL (ref 0.0–0.1)
Basophils Relative: 0 % (ref 0–1)
Eosinophils Absolute: 0.1 10*3/uL (ref 0.0–0.7)
Eosinophils Relative: 1 % (ref 0–5)
HCT: 43.8 % (ref 39.0–52.0)
Hemoglobin: 14.8 g/dL (ref 13.0–17.0)
Lymphocytes Relative: 11 % — ABNORMAL LOW (ref 12–46)
Lymphs Abs: 1.1 10*3/uL (ref 0.7–4.0)
MCH: 27 pg (ref 26.0–34.0)
MCHC: 33.8 g/dL (ref 30.0–36.0)
MCV: 79.9 fL (ref 78.0–100.0)
Monocytes Absolute: 0.8 10*3/uL (ref 0.1–1.0)
Monocytes Relative: 8 % (ref 3–12)
Neutro Abs: 8.2 10*3/uL — ABNORMAL HIGH (ref 1.7–7.7)
Neutrophils Relative %: 80 % — ABNORMAL HIGH (ref 43–77)
Platelets: 341 10*3/uL (ref 150–400)
RBC: 5.48 MIL/uL (ref 4.22–5.81)
RDW: 16.9 % — ABNORMAL HIGH (ref 11.5–15.5)
WBC: 10.2 10*3/uL (ref 4.0–10.5)

## 2013-11-13 LAB — COMPLETE METABOLIC PANEL WITH GFR
ALT: 51 U/L (ref 0–53)
AST: 68 U/L — ABNORMAL HIGH (ref 0–37)
Albumin: 4.6 g/dL (ref 3.5–5.2)
Alkaline Phosphatase: 66 U/L (ref 39–117)
BUN: 15 mg/dL (ref 6–23)
CO2: 28 mEq/L (ref 19–32)
Calcium: 9.9 mg/dL (ref 8.4–10.5)
Chloride: 93 mEq/L — ABNORMAL LOW (ref 96–112)
Creat: 1.16 mg/dL (ref 0.50–1.35)
GFR, Est African American: 74 mL/min
GFR, Est Non African American: 64 mL/min
Glucose, Bld: 122 mg/dL — ABNORMAL HIGH (ref 70–99)
Potassium: 4.4 mEq/L (ref 3.5–5.3)
Sodium: 133 mEq/L — ABNORMAL LOW (ref 135–145)
Total Bilirubin: 0.7 mg/dL (ref 0.3–1.2)
Total Protein: 8.2 g/dL (ref 6.0–8.3)

## 2013-11-13 LAB — PSA, MEDICARE: PSA: 0.45 ng/mL (ref <=4.00)

## 2013-11-13 LAB — TSH: TSH: 1.921 u[IU]/mL (ref 0.350–4.500)

## 2013-11-13 MED ORDER — PANTOPRAZOLE SODIUM 40 MG PO TBEC: DELAYED_RELEASE_TABLET | ORAL | Status: DC

## 2013-11-13 MED ORDER — AMLODIPINE BESYLATE 10 MG PO TABS: 10.0000 mg | ORAL_TABLET | Freq: Every day | ORAL | Status: DC

## 2013-11-13 MED ORDER — LISINOPRIL-HYDROCHLOROTHIAZIDE 20-25 MG PO TABS: ORAL_TABLET | ORAL | Status: DC

## 2013-11-13 NOTE — Progress Notes (Signed)
Patient ID: Francisco [name] MRN: [id number], DOB: [birth year] 68 y.o. Date of Encounter: 11/13/2013, 1:04 PM    Chief Complaint: Physical (CPE)  HPI: 68 y.o. y/o white male here for CPE.   He has no complaints. Says he has actually been feeling quite well recently.   Review of Systems: Consitutional: No fever, chills, fatigue, night sweats, lymphadenopathy, or weight changes. Eyes: No visual changes, eye redness, or discharge. ENT/Mouth: Ears: No otalgia, tinnitus, hearing loss, discharge. Nose: No congestion, rhinorrhea, sinus pain, or epistaxis. Throat: No sore throat, post nasal drip, or teeth pain. Cardiovascular: No CP, palpitations, diaphoresis, DOE, edema, orthopnea, PND. Respiratory: No cough, hemoptysis, SOB, or wheezing. Gastrointestinal: No anorexia, dysphagia, reflux, pain, nausea, vomiting, hematemesis, diarrhea, constipation, BRBPR, or melena. Genitourinary: No dysuria, frequency, urgency, hematuria, incontinence, nocturia, decreased urinary stream, discharge, impotence, or testicular pain/masses. Musculoskeletal: No decreased ROM, myalgias, stiffness, joint swelling, or weakness. Skin: No rash, erythema, lesion changes, pain, warmth, jaundice, or pruritis. Neurological: No headache, dizziness, syncope, seizures, tremors, memory loss, coordination problems, or paresthesias. Psychological: No anxiety, depression, hallucinations, SI/HI. Endocrine: No fatigue, polydipsia, polyphagia, polyuria, or known diabetes. All other systems were reviewed and are otherwise negative.  Past Medical History  Diagnosis Date  . Celiac disease   . Hypertension   . Heart murmur   . Shortness of breath   . GERD (gastroesophageal reflux disease)   . 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 Anemia   . Multiple duodenal ulcers      Past Surgical History  Procedure Laterality Date  . Hemorrhoid surgery    . Mole removal    . Hernia repair    .  Esophagogastroduodenoscopy  07/03/2012    Procedure: ESOPHAGOGASTRODUODENOSCOPY (EGD);  Surgeon: Winfield Cunas., MD;  Location: [facility] ENDOSCOPY;  Service: Endoscopy;  Laterality: N/A;    Home Meds:  Current Outpatient Prescriptions on File Prior to Visit  Medication Sig Dispense Refill  . cyclobenzaprine (FLEXERIL) 10 MG tablet Take 10 mg by mouth every 8 (eight) hours as needed. For muscle spasm.      Marland Kitchen lisinopril-hydrochlorothiazide (PRINZIDE,ZESTORETIC) 20-25 MG per tablet TAKE 1 TABLET BY MOUTH DAILY  90 tablet  0  . pantoprazole (PROTONIX) 40 MG tablet TAKE 1 TABLET BY MOUTH TWICE DAILY BEFORE A MEAL  180 tablet  0   No current facility-administered medications on file prior to visit.    Allergies:  Allergies  Allergen Reactions  . Gluten Meal Other (See Comments)    Celiac Disease    History   Social History  . Marital Status: Married    Spouse Name: N/A    Number of Children: N/A  . Years of Education: N/A   Occupational History  . Not on file.   Social History Main Topics  . Smoking status: Former Smoker -- 50 years    Types: Cigarettes    Quit date: 05/06/2012  . Smokeless tobacco: Former Systems developer  . Alcohol Use: 0.0 oz/week    1-2 Glasses of wine per week     Comment: ocassional  . Drug Use: No  . Sexual Activity: No   Other Topics Concern  . Not on file   Social History Narrative   Lives alone. --as of 11/2013   Smokes e-cig. Has nicotine in it. No real cigarette any more    Family History  Problem Relation Age of Onset  . Hodgkin's lymphoma Father     Physical Exam: Blood pressure 154/76, pulse 76, temperature  98.4 F (36.9 C), temperature source Oral, resp. rate 20, height 5' 7.5" (1.715 m), weight 236 lb (107.049 kg).  General:  Obese WM. Appears in no acute distress. HEENT: Normocephalic, atraumatic. Conjunctiva pink, sclera non-icteric. Pupils 2 mm constricting to 1 mm, round, regular, and equally reactive to light and accomodation. EOMI.  Internal auditory canal clear. TMs with good cone of light and without pathology. Nasal mucosa pink. Nares are without discharge. No sinus tenderness. Oral mucosa pink.  Pharynx without exudate.   Neck: Supple. Trachea midline. No thyromegaly. Full ROM. No lymphadenopathy. I can hear it either carotid bruits or radiation of his murmur up both sides of his neck. Lungs: Clear to auscultation bilaterally without wheezes, rales, or rhonchi. Breathing is of normal effort and unlabored. Cardiovascular: RRR with S1 S2.  II/VI murmur at Right 2nd ICS. III - IV/VI murmur at apex. Abdomen: Soft, non-tender, non-distended with normoactive bowel sounds. No hepatosplenomegaly or masses. No rebound/guarding. No CVA tenderness. No hernias. Rectal: No external hemorrhoids or fissures. Rectal vault without masses. Prostate gland firm and smooth. No nodularity, tenderness, mass, or induration.  Musculoskeletal: Full range of motion and 5/5 strength throughout. Without swelling, atrophy, tenderness, crepitus, or warmth. Extremities without clubbing, cyanosis, or edema. Calves supple. Skin: Warm and moist without erythema, ecchymosis, wounds, or rash. Neuro: A+Ox3. CN II-XII grossly intact. Moves all extremities spontaneously. Full sensation throughout. Normal gait. DTR 2+ throughout upper and lower extremities. Finger to nose intact. Psych:  Responds to questions appropriately with a normal affect.   Assessment/Plan:  68 y.o. y/o male here for CPE -1. Visit for preventive health examination Screening Labs: He is fasting.  - CBC with Differential - COMPLETE METABOLIC PANEL WITH GFR - Lipid panel - TSH - Vit D  25 hydroxy (rtn osteoporosis monitoring) - PSA, Medicare  2. H/O Acute blood loss anemia - CBC with Differential  3. Multiple duodenal ulcers At the time of his anemia in 06/2012 endoscopy showed multiple duodenal ulcers as the cause of the bleed. He has been on proton next twice a day since then.  Continues to take this twice a day. He reports he is having no symptoms consistent with recurrent ulcers or anemia.  4. Celiac disease He controls this with gluten-free diet.  5. Hypertension Blood Pressure is suboptimal. Will increase his Norvasc from 5 mg to 10 mg today. Continue other blood pressure medications the same. - COMPLETE METABOLIC PANEL WITH GFR - amLODipine (NORVASC) 10 MG tablet; Take 1 tablet (10 mg total) by mouth daily.  Dispense: 90 tablet; Refill: 3  6. Heart murmur Last echocardiogram was October 2013. He had significant murmur on exam so that a followup echo to followup. - 2D Echocardiogram without contrast; Future  7. Carotid bruit He had carotid duplex performed October 2013. 40-50% stenosis on the right.  0- 39% on the left. On  exam he has carotid artery bruit or radiation of his murmur up into his neck. We'll get followup carotid artery Doppler to followup. - Carotid duplex; Future  8. Need for Tdap vaccination - Tdap vaccine greater than or equal to 7yo IM  9. Need for prophylactic vaccination against Streptococcus pneumoniae (pneumococcus) - Pneumococcal conjugate vaccine 13-valent  10. Need for prophylactic vaccination and inoculation against influenza - Flu Vaccine QUAD 36+ mos PF IM (Fluarix)  11. He says that he very rarely uses Flexeril. Uses on rare occasion he may have muscle spasm in his back.  A. Screening Labs: See #1 above  B.  Screening For Prostate Cancer: Exam normal. Check PSA.  C. Screening For Colorectal Cancer:  He reports he has had colonoscopies performed. He is uncertain of the exact date of the last colonoscopy but says that that was when they did a biopsy that confirmed celiac. Says that otherwise the colonoscopy was normal. Had no polyps. Says that this was performed less than 10 years ago.  D. Immunizations: Flu:  Pt wants to get the influenza vaccine today. We'll get now. Tetanus:  I can find no documentation of any  tetanus vaccine given here at all. None documented in Epic. Pt cannot recall having any recent tetanus. He is agreeable to update now. Pneumococcal: He has had no pneumonia vaccine. He is over age 18. We'll give Prevnar 12 now. 6-12 months later he would need Pneumovax 23. Zostavax: I told him to check with his insurance company regarding the cost of Zostavax.  If he is agreeable to pay that price, he can call us and we will send a prescription to his pharmacy for him to go get immunization there.  Signed:   [address] [place], [place]  11/13/2013 1:04 PM

## 2013-11-14 LAB — HEMOGLOBIN A1C
Hgb A1c MFr Bld: 6.7 % — ABNORMAL HIGH (ref <5.7)
Mean Plasma Glucose: 146 mg/dL — ABNORMAL HIGH (ref <117)

## 2013-11-14 LAB — VITAMIN D 25 HYDROXY (VIT D DEFICIENCY, FRACTURES): Vit D, 25-Hydroxy: 25 ng/mL — ABNORMAL LOW (ref 30–89)

## 2013-11-17 ENCOUNTER — Telehealth: Payer: Self-pay | Admitting: Physician Assistant

## 2013-11-17 DIAGNOSIS — E559 Vitamin D deficiency, unspecified: Secondary | ICD-10-CM

## 2013-11-17 DIAGNOSIS — E785 Hyperlipidemia, unspecified: Secondary | ICD-10-CM

## 2013-11-17 NOTE — Telephone Encounter (Signed)
PT would like to know once his blood work has came back Call back number [phone]

## 2013-11-18 MED ORDER — PRAVASTATIN SODIUM 40 MG PO TABS: 40.0000 mg | ORAL_TABLET | Freq: Every evening | ORAL | Status: DC

## 2013-11-18 MED ORDER — VITAMIN D3 25 MCG (1000 UT) PO CAPS: 1.0000 | ORAL_CAPSULE | Freq: Every day | ORAL | Status: DC

## 2013-11-18 NOTE — Telephone Encounter (Signed)
Message copied by Olena Mater on Tue Nov 18, 2013  2:12 PM ------      Message from: Dena Billet      Created: Mon Nov 17, 2013  5:23 PM       Tell pt that he is developing diabetes. Have him schedule OV to come in and discuss. Also tell him that we definitely need him to RTC for f/u OV again in 3 months so we can monitor this.      Also, cholesterol is too high, especially given that he has developed diabetes. Start Pravastatin 77m one po QHS # 30/ 2 refills. Tell him to come fasting to the appt in 3 months so we can recheck fasting lipids at that visit.       Vitamin D slightly low--start otc vitamin d 1,000 IU QD. ------

## 2013-11-18 NOTE — Telephone Encounter (Signed)
Pt aware of lab results. Has chosen to wait 3 mth to make follow up appt.  Will try to watch diet.  Agreeable to start pravastatin, told to take at bedtime.  Pt stated it may be hard for him to remember after he gets home and has a few drinks???  Rx to pharmacy for Pravastatin and Vit D

## 2013-11-20 ENCOUNTER — Ambulatory Visit (HOSPITAL_COMMUNITY): Payer: Medicare HMO | Attending: Physician Assistant

## 2013-11-20 DIAGNOSIS — I10 Essential (primary) hypertension: Secondary | ICD-10-CM | POA: Insufficient documentation

## 2013-11-20 DIAGNOSIS — R0989 Other specified symptoms and signs involving the circulatory and respiratory systems: Secondary | ICD-10-CM

## 2013-11-20 DIAGNOSIS — Z87891 Personal history of nicotine dependence: Secondary | ICD-10-CM | POA: Insufficient documentation

## 2013-11-20 DIAGNOSIS — I6529 Occlusion and stenosis of unspecified carotid artery: Secondary | ICD-10-CM

## 2013-11-20 DIAGNOSIS — I658 Occlusion and stenosis of other precerebral arteries: Secondary | ICD-10-CM | POA: Insufficient documentation

## 2013-12-03 ENCOUNTER — Ambulatory Visit (HOSPITAL_COMMUNITY): Payer: Medicare HMO | Attending: Physician Assistant | Admitting: Radiology

## 2013-12-03 ENCOUNTER — Other Ambulatory Visit: Payer: Self-pay

## 2013-12-03 DIAGNOSIS — I359 Nonrheumatic aortic valve disorder, unspecified: Secondary | ICD-10-CM | POA: Diagnosis present

## 2013-12-03 DIAGNOSIS — I10 Essential (primary) hypertension: Secondary | ICD-10-CM | POA: Insufficient documentation

## 2013-12-03 DIAGNOSIS — R079 Chest pain, unspecified: Secondary | ICD-10-CM

## 2013-12-03 DIAGNOSIS — R0602 Shortness of breath: Secondary | ICD-10-CM

## 2013-12-03 DIAGNOSIS — Z87891 Personal history of nicotine dependence: Secondary | ICD-10-CM | POA: Diagnosis not present

## 2013-12-03 DIAGNOSIS — E669 Obesity, unspecified: Secondary | ICD-10-CM | POA: Insufficient documentation

## 2013-12-03 DIAGNOSIS — I080 Rheumatic disorders of both mitral and aortic valves: Secondary | ICD-10-CM | POA: Insufficient documentation

## 2013-12-03 DIAGNOSIS — R011 Cardiac murmur, unspecified: Secondary | ICD-10-CM

## 2013-12-03 NOTE — Progress Notes (Signed)
Echocardiogram performed.  

## 2013-12-04 ENCOUNTER — Encounter: Payer: Self-pay | Admitting: Family Medicine

## 2014-03-06 ENCOUNTER — Other Ambulatory Visit: Payer: Self-pay | Admitting: Physician Assistant

## 2014-03-06 NOTE — Telephone Encounter (Signed)
Refill appropriate and filled per protocol. 

## 2014-06-05 ENCOUNTER — Other Ambulatory Visit: Payer: Self-pay | Admitting: Physician Assistant

## 2014-06-05 NOTE — Telephone Encounter (Signed)
Refill appropriate and filled per protocol. 

## 2014-08-13 ENCOUNTER — Ambulatory Visit (INDEPENDENT_AMBULATORY_CARE_PROVIDER_SITE_OTHER): Payer: Medicare HMO | Admitting: Physician Assistant

## 2014-08-13 ENCOUNTER — Encounter: Payer: Self-pay | Admitting: Physician Assistant

## 2014-08-13 VITALS — BP 132/64 | HR 80 | Temp 98.2°F | Resp 20 | Wt 227.0 lb

## 2014-08-13 DIAGNOSIS — L309 Dermatitis, unspecified: Secondary | ICD-10-CM

## 2014-08-13 DIAGNOSIS — R351 Nocturia: Secondary | ICD-10-CM

## 2014-08-13 DIAGNOSIS — N3943 Post-void dribbling: Secondary | ICD-10-CM

## 2014-08-13 DIAGNOSIS — N401 Enlarged prostate with lower urinary tract symptoms: Secondary | ICD-10-CM

## 2014-08-13 DIAGNOSIS — R39198 Other difficulties with micturition: Secondary | ICD-10-CM

## 2014-08-13 DIAGNOSIS — N400 Enlarged prostate without lower urinary tract symptoms: Secondary | ICD-10-CM | POA: Insufficient documentation

## 2014-08-13 DIAGNOSIS — R3989 Other symptoms and signs involving the genitourinary system: Secondary | ICD-10-CM

## 2014-08-13 MED ORDER — TAMSULOSIN HCL 0.4 MG PO CAPS: 0.4000 mg | ORAL_CAPSULE | Freq: Every day | ORAL | Status: DC

## 2014-08-13 MED ORDER — CLOTRIMAZOLE-BETAMETHASONE 1-0.05 % EX CREA: 1.0000 "application " | TOPICAL_CREAM | Freq: Two times a day (BID) | CUTANEOUS | Status: DC

## 2014-08-13 NOTE — Progress Notes (Signed)
Patient ID: Francisco [name] MRN: [id number], DOB: 06-Oct-1946, 68 y.o. Date of Encounter: @DATE @  Chief Complaint:  Chief Complaint  Patient presents with  . difficulty with urination    doesn't seem to be able to empty bladder completely  . c/o rash around ankles and lower legs    HPI: 68 y.o. year old white male  presents with above problems.  He states that during the day he has not had any real problems with his urination. He reports that his main concern is that he wakes up multiple times at night to urinate.  He says he does drink quite a bit of water in the evening. He reports he has no problems with hesitancy. Reports that he does have problems with dribbling at the end of urination. Says that he does take his HCTZ and all of his medicines in the morning.  He also reports that he developed this rash on his ankles/feet about 3 weeks ago. Says it is only slightly itchy. Says that he is unaware of any new products or anything coming in contact with that area.   Past Medical History  Diagnosis Date  . Celiac disease   . Hypertension   . Heart murmur   . Shortness of breath   . GERD (gastroesophageal reflux disease)   . 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 Anemia   . Multiple duodenal ulcers      Home Meds: Outpatient Prescriptions Prior to Visit  Medication Sig Dispense Refill  . amLODipine (NORVASC) 10 MG tablet Take 1 tablet (10 mg total) by mouth daily.  90 tablet  3  . Cholecalciferol (VITAMIN D3) 1000 UNITS CAPS Take 1 capsule (1,000 Units total) by mouth daily.  30 capsule  11  . cyclobenzaprine (FLEXERIL) 10 MG tablet Take 10 mg by mouth every 8 (eight) hours as needed. For muscle spasm.      Marland Kitchen lisinopril-hydrochlorothiazide (PRINZIDE,ZESTORETIC) 20-25 MG per tablet TAKE 1 TABLET BY MOUTH DAILY  90 tablet  0  . pantoprazole (PROTONIX) 40 MG tablet TAKE 1 TABLET BY MOUTH TWICE DAILY BEFORE A MEAL  180 tablet  3  . pravastatin  (PRAVACHOL) 40 MG tablet TAKE 1 TABLET BY MOUTH EVERY EVENING  90 tablet  0   No facility-administered medications prior to visit.    Allergies:  Allergies  Allergen Reactions  . Gluten Meal Other (See Comments)    Celiac Disease    History   Social History  . Marital Status: Married    Spouse Name: N/A    Number of Children: N/A  . Years of Education: N/A   Occupational History  . Not on file.   Social History Main Topics  . Smoking status: Former Smoker -- 50 years    Types: Cigarettes    Quit date: 05/06/2012  . Smokeless tobacco: Former Systems developer  . Alcohol Use: 0.0 oz/week    1-2 Glasses of wine per week     Comment: ocassional  . Drug Use: No  . Sexual Activity: No   Other Topics Concern  . Not on file   Social History Narrative   Lives alone. --as of 11/2013   Smokes e-cig. Has nicotine in it. No real cigarette any more    Family History  Problem Relation Age of Onset  . Hodgkin's lymphoma Father      Review of Systems:  See HPI for pertinent ROS. All other ROS negative.    Physical Exam: Blood pressure 132/64,  pulse 80, temperature 98.2 F (36.8 C), temperature source Oral, resp. rate 20, weight 227 lb (102.967 kg)., Body mass index is 35.01 kg/(m^2). General: Abdominal Obesity. WM. Appears in no acute distress. Neck: Supple. No thyromegaly. No lymphadenopathy. Lungs: Clear bilaterally to auscultation without wheezes, rales, or rhonchi. Breathing is unlabored. Heart: RRR with S1 S2. No murmurs, rubs, or gallops. Musculoskeletal:  Strength and tone normal for age. DRE; Normal. Extremities/Skin: Areas of well demarcated margins/edges. Areas with diffuse erythema at base with overlying scale.  These are located around Right leg, just above ankle level.  Same type areas are also on left foot/lower leg--- Neuro: Alert and oriented X 3. Moves all extremities spontaneously. Gait is normal. CNII-XII grossly in tact. Psych:  Responds to questions appropriately  with a normal affect.     ASSESSMENT AND PLAN:  68 y.o. year old male with  1. Difficulty in urination - Urinalysis, Routine w reflex microscopic - PSA, Medicare - tamsulosin (FLOMAX) 0.4 MG CAPS capsule; Take 1 capsule (0.4 mg total) by mouth daily.  Dispense: 30 capsule; Refill: 3  2. BPH (benign prostatic hypertrophy) - PSA, Medicare - tamsulosin (FLOMAX) 0.4 MG CAPS capsule; Take 1 capsule (0.4 mg total) by mouth daily.  Dispense: 30 capsule; Refill: 3  3. Nocturia associated with benign prostatic hypertrophy  - PSA, Medicare - tamsulosin (FLOMAX) 0.4 MG CAPS capsule; Take 1 capsule (0.4 mg total) by mouth daily.  Dispense: 30 capsule; Refill: 3  4. Benign prostatic hyperplasia (BPH) with post-void dribbling - PSA, Medicare - tamsulosin (FLOMAX) 0.4 MG CAPS capsule; Take 1 capsule (0.4 mg total) by mouth daily.  Dispense: 30 capsule; Refill: 3  5. Dermatitis - clotrimazole-betamethasone (LOTRISONE) cream; Apply 1 application topically 2 (two) times daily.  Dispense: 45 g; Refill: 0  Last PSA was normal  11/13/13. Will repeat PSA . Will treat with Flomax. Also told him to stop drinking after 6 PM or at least drink very small amounts after this time.  will treat his rash with the Lotrisone.  Have him follow up in about 3 or 4 weeks to make sure that both issues are improving/resolved.  follow up sooner if either issue worsens.   [address] [place], [place], [facility] 08/13/2014 1:59 PM

## 2014-08-14 LAB — URINALYSIS, ROUTINE W REFLEX MICROSCOPIC
Glucose, UA: NEGATIVE mg/dL
Hgb urine dipstick: NEGATIVE
Ketones, ur: 15 mg/dL — AB
Leukocytes, UA: NEGATIVE
Nitrite: NEGATIVE
Protein, ur: NEGATIVE mg/dL
Specific Gravity, Urine: 1.017 (ref 1.005–1.030)
Urobilinogen, UA: 0.2 mg/dL (ref 0.0–1.0)
pH: 6 (ref 5.0–8.0)

## 2014-08-14 LAB — PSA, MEDICARE: PSA: 0.38 ng/mL (ref <=4.00)

## 2014-09-01 ENCOUNTER — Other Ambulatory Visit: Payer: Self-pay | Admitting: Family Medicine

## 2014-09-01 ENCOUNTER — Other Ambulatory Visit: Payer: Self-pay | Admitting: Physician Assistant

## 2014-09-01 NOTE — Telephone Encounter (Signed)
Medication refilled per protocol. 

## 2014-09-14 ENCOUNTER — Encounter: Payer: Self-pay | Admitting: Physician Assistant

## 2014-09-14 ENCOUNTER — Ambulatory Visit (INDEPENDENT_AMBULATORY_CARE_PROVIDER_SITE_OTHER): Payer: Medicare HMO | Admitting: Physician Assistant

## 2014-09-14 VITALS — BP 132/60 | HR 80 | Temp 97.8°F | Resp 20 | Ht 67.25 in | Wt 232.0 lb

## 2014-09-14 DIAGNOSIS — N400 Enlarged prostate without lower urinary tract symptoms: Secondary | ICD-10-CM

## 2014-09-14 DIAGNOSIS — L309 Dermatitis, unspecified: Secondary | ICD-10-CM

## 2014-09-14 DIAGNOSIS — Z23 Encounter for immunization: Secondary | ICD-10-CM

## 2014-09-14 NOTE — Progress Notes (Signed)
Patient ID: Francisco [name] MRN: [id number], DOB: 09/03/1946, 68 y.o. Date of Encounter: @DATE @  Chief Complaint:  Chief Complaint  Patient presents with  . 1 month follow up    thinks rash from Pravastatin so he has stopped taking    HPI: 68 y.o. year old white male  presents for f/u OV.  He had office visit 08/13/14 regarding 2 problems. The following is copied from that note.:  He states that during the day he has not had any real problems with his urination. He reports that his main concern is that he wakes up multiple times at night to urinate.  He says he does drink quite a bit of water in the evening. He reports he has no problems with hesitancy. Reports that he does have problems with dribbling at the end of urination. Says that he does take his HCTZ and all of his medicines in the morning.  He also reports that he developed this rash on his ankles/feet about 3 weeks ago. Says it is only slightly itchy. Says that he is unaware of any new products or anything coming in contact with that area.  At that [place] I had the following Assessment/Plan:   Nocturia associated with benign prostatic hypertrophy - PSA, Medicare - tamsulosin (FLOMAX) 0.4 MG CAPS capsule; Take 1 capsule (0.4 mg total) by mouth daily.  Dispense: 30 capsule; Refill: 3   Benign prostatic hyperplasia (BPH) with post-void dribbling - PSA, Medicare - tamsulosin (FLOMAX) 0.4 MG CAPS capsule; Take 1 capsule (0.4 mg total) by mouth daily.  Dispense: 30 capsule; Refill: 3   Dermatitis - clotrimazole-betamethasone (LOTRISONE) cream; Apply 1 application topically 2 (two) times daily.  Dispense: 45 g; Refill: 0  Last PSA was normal  11/13/13. Will repeat PSA . Will treat with Flomax. Also told him to stop drinking after 6 PM or at least drink very small amounts after this time.  will treat his rash with the Lotrisone.  Have him follow up in about 3 or 4 weeks to make sure that both issues are  improving/resolved.  follow up sooner if either issue worsens.    TODAY--09/14/2014---he states that the urine symptoms are better---"absolutely". Says that some nights he doesn't wake at all to urinate. He is having no adverse effects to the Flomax.  He also says that the rash on his legs is much better and is almost gone. He has been applying the cream that I prescribed at the last visit. However he also says that he has stopped the pravastatin because he thinks that this is causing the rash. Also he says that in the past Dr. [name] had told him that his cholesterol was high but he also told him that his good cholesterol was high so this helped balance it out. Therefore patient doesn't really think he needs cholesterol medicine and HE HAS DECIDED that he is NOT going to restart pravastatin.   Past Medical History  Diagnosis Date  . Celiac disease   . Hypertension   . Heart murmur   . Shortness of breath   . GERD (gastroesophageal reflux disease)   . 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 Anemia   . Multiple duodenal ulcers      Home Meds: Outpatient Prescriptions Prior to Visit  Medication Sig Dispense Refill  . amLODipine (NORVASC) 10 MG tablet Take 1 tablet (10 mg total) by mouth daily. 90 tablet 3  . Cholecalciferol (VITAMIN D3) 1000 UNITS CAPS Take 1  capsule (1,000 Units total) by mouth daily. 30 capsule 11  . clotrimazole-betamethasone (LOTRISONE) cream APPLY TO THE AFFECTED AREA TWICE DAILY 45 g 0  . cyclobenzaprine (FLEXERIL) 10 MG tablet Take 10 mg by mouth every 8 (eight) hours as needed. For muscle spasm.    Marland Kitchen lisinopril-hydrochlorothiazide (PRINZIDE,ZESTORETIC) 20-25 MG per tablet TAKE 1 TABLET BY MOUTH DAILY 90 tablet 0  . pantoprazole (PROTONIX) 40 MG tablet TAKE 1 TABLET BY MOUTH TWICE DAILY BEFORE A MEAL 180 tablet 3  . tamsulosin (FLOMAX) 0.4 MG CAPS capsule Take 1 capsule (0.4 mg total) by mouth daily. 30 capsule 3  . pravastatin  (PRAVACHOL) 40 MG tablet TAKE 1 TABLET BY MOUTH EVERY EVENING 90 tablet 0   No facility-administered medications prior to visit.    Allergies:  Allergies  Allergen Reactions  . Gluten Meal Other (See Comments)    Celiac Disease    History   Social History  . Marital Status: Married    Spouse Name: N/A    Number of Children: N/A  . Years of Education: N/A   Occupational History  . Not on file.   Social History Main Topics  . Smoking status: Former Smoker -- 50 years    Types: Cigarettes    Quit date: 05/06/2012  . Smokeless tobacco: Former Systems developer  . Alcohol Use: 0.0 oz/week    1-2 Glasses of wine per week     Comment: ocassional  . Drug Use: No  . Sexual Activity: No   Other Topics Concern  . Not on file   Social History Narrative   Lives alone. --as of 11/2013   Smokes e-cig. Has nicotine in it. No real cigarette any more    Family History  Problem Relation Age of Onset  . Hodgkin's lymphoma Father      Review of Systems:  See HPI for pertinent ROS. All other ROS negative.    Physical Exam: Blood pressure 132/60, pulse 80, temperature 97.8 F (36.6 C), temperature source Oral, resp. rate 20, height 5' 7.25" (1.708 m), weight 232 lb (105.235 kg)., Body mass index is 36.07 kg/(m^2). General: Abdominal Obesity. WM. Appears in no acute distress. Neck: Supple. No thyromegaly. No lymphadenopathy. Lungs: Clear bilaterally to auscultation without wheezes, rales, or rhonchi. Breathing is unlabored. Heart: RRR with S1 S2. No murmurs, rubs, or gallops. Musculoskeletal:  Strength and tone normal for age. DRE; Normal. Extremities/Skin:  Very minimal pink erythema at lower leg--rash almost completely resolved. Neuro: Alert and oriented X 3. Moves all extremities spontaneously. Gait is normal. CNII-XII grossly in tact. Psych:  Responds to questions appropriately with a normal affect.     ASSESSMENT AND PLAN:  68 y.o. year old male with    BPH (benign prostatic  hypertrophy) Nocturia associated with benign prostatic hypertrophy Benign prostatic hyperplasia (BPH) with post-void dribbling - PSA, Medicare--PSA was normal 08/13/2014. Symptoms controlled with Flomax. Cont. Flomax.  - tamsulosin (FLOMAX) 0.4 MG CAPS capsule; Take 1 capsule (0.4 mg total) by mouth daily.  Dispense: 30 capsule; Refill: 3   Dermatitis - clotrimazole-betamethasone (LOTRISONE) cream; Apply 1 application topically 2 (two) times daily.  Dispense: 45 g; Refill: 0 Rashes almost resolved with Lotrisone. Continue applying Lotrisone. I recommended to patient to continue this cream until the rash is completely resolved. Recommended that he then try the pravastatin again. However he says that he has decided he is not going to go back on the pravastatin or any cholesterol medicine.  Will Give influenza vaccine today and also the  Pneumovax 23 vaccine today. Pt agreeable to receive both of these today.  Signed, [address] [place], [place], [facility] 09/14/2014 9:13 AM

## 2014-11-13 ENCOUNTER — Encounter: Payer: Self-pay | Admitting: Family Medicine

## 2014-11-13 ENCOUNTER — Other Ambulatory Visit: Payer: Self-pay | Admitting: Physician Assistant

## 2014-11-13 NOTE — Telephone Encounter (Signed)
One refill sent.

## 2014-12-04 ENCOUNTER — Other Ambulatory Visit: Payer: Self-pay | Admitting: Physician Assistant

## 2014-12-04 NOTE — Telephone Encounter (Signed)
Medication refilled per protocol. 

## 2015-03-01 ENCOUNTER — Other Ambulatory Visit: Payer: Self-pay | Admitting: Physician Assistant

## 2015-03-01 NOTE — Telephone Encounter (Signed)
Medication refilled per protocol. 

## 2015-05-27 ENCOUNTER — Encounter: Payer: Self-pay | Admitting: Family Medicine

## 2015-05-27 ENCOUNTER — Other Ambulatory Visit: Payer: Self-pay | Admitting: Physician Assistant

## 2015-05-27 NOTE — Telephone Encounter (Signed)
Medication refill for one time only.  Patient needs to be seen.  Letter sent for patient to call and schedule 

## 2015-06-09 ENCOUNTER — Ambulatory Visit (INDEPENDENT_AMBULATORY_CARE_PROVIDER_SITE_OTHER): Payer: Commercial Managed Care - HMO | Admitting: Physician Assistant

## 2015-06-09 ENCOUNTER — Encounter: Payer: Self-pay | Admitting: Physician Assistant

## 2015-06-09 VITALS — BP 122/58 | HR 80 | Temp 98.0°F | Resp 16 | Ht 69.0 in | Wt 227.0 lb

## 2015-06-09 DIAGNOSIS — E559 Vitamin D deficiency, unspecified: Secondary | ICD-10-CM | POA: Insufficient documentation

## 2015-06-09 DIAGNOSIS — K900 Celiac disease: Secondary | ICD-10-CM | POA: Diagnosis not present

## 2015-06-09 DIAGNOSIS — R600 Localized edema: Secondary | ICD-10-CM

## 2015-06-09 DIAGNOSIS — K219 Gastro-esophageal reflux disease without esophagitis: Secondary | ICD-10-CM | POA: Diagnosis not present

## 2015-06-09 DIAGNOSIS — R7309 Other abnormal glucose: Secondary | ICD-10-CM | POA: Diagnosis not present

## 2015-06-09 DIAGNOSIS — N400 Enlarged prostate without lower urinary tract symptoms: Secondary | ICD-10-CM

## 2015-06-09 DIAGNOSIS — E785 Hyperlipidemia, unspecified: Secondary | ICD-10-CM | POA: Diagnosis not present

## 2015-06-09 DIAGNOSIS — R739 Hyperglycemia, unspecified: Secondary | ICD-10-CM

## 2015-06-09 DIAGNOSIS — K264 Chronic or unspecified duodenal ulcer with hemorrhage: Secondary | ICD-10-CM | POA: Diagnosis not present

## 2015-06-09 DIAGNOSIS — K2981 Duodenitis with bleeding: Secondary | ICD-10-CM | POA: Diagnosis not present

## 2015-06-09 DIAGNOSIS — K269 Duodenal ulcer, unspecified as acute or chronic, without hemorrhage or perforation: Secondary | ICD-10-CM

## 2015-06-09 DIAGNOSIS — R011 Cardiac murmur, unspecified: Secondary | ICD-10-CM | POA: Diagnosis not present

## 2015-06-09 DIAGNOSIS — I10 Essential (primary) hypertension: Secondary | ICD-10-CM

## 2015-06-09 LAB — LIPID PANEL
Cholesterol: 201 mg/dL — ABNORMAL HIGH (ref 125–200)
HDL: 124 mg/dL (ref >=40)
LDL Cholesterol: 65 mg/dL (ref <130)
Total CHOL/HDL Ratio: 1.6 Ratio (ref <=5.0)
Triglycerides: 59 mg/dL (ref <150)
VLDL: 12 mg/dL (ref <30)

## 2015-06-09 LAB — CBC WITH DIFFERENTIAL/PLATELET
Basophils Absolute: 0 10*3/uL (ref 0.0–0.1)
Basophils Relative: 0 % (ref 0–1)
Eosinophils Absolute: 0.1 10*3/uL (ref 0.0–0.7)
Eosinophils Relative: 1 % (ref 0–5)
HCT: 41.2 % (ref 39.0–52.0)
Hemoglobin: 14.6 g/dL (ref 13.0–17.0)
Lymphocytes Relative: 12 % (ref 12–46)
Lymphs Abs: 0.8 10*3/uL (ref 0.7–4.0)
MCH: 30.1 pg (ref 26.0–34.0)
MCHC: 35.4 g/dL (ref 30.0–36.0)
MCV: 84.9 fL (ref 78.0–100.0)
MPV: 10.3 fL (ref 8.6–12.4)
Monocytes Absolute: 0.6 10*3/uL (ref 0.1–1.0)
Monocytes Relative: 9 % (ref 3–12)
Neutro Abs: 5.1 10*3/uL (ref 1.7–7.7)
Neutrophils Relative %: 78 % — ABNORMAL HIGH (ref 43–77)
Platelets: 288 10*3/uL (ref 150–400)
RBC: 4.85 MIL/uL (ref 4.22–5.81)
RDW: 14.5 % (ref 11.5–15.5)
WBC: 6.6 10*3/uL (ref 4.0–10.5)

## 2015-06-09 LAB — COMPLETE METABOLIC PANEL WITH GFR
ALT: 60 U/L — ABNORMAL HIGH (ref 9–46)
AST: 121 U/L — ABNORMAL HIGH (ref 10–35)
Albumin: 4 g/dL (ref 3.6–5.1)
Alkaline Phosphatase: 59 U/L (ref 40–115)
BUN: 8 mg/dL (ref 7–25)
CO2: 23 mmol/L (ref 20–31)
Calcium: 9 mg/dL (ref 8.6–10.3)
Chloride: 83 mmol/L — ABNORMAL LOW (ref 98–110)
Creat: 0.88 mg/dL (ref 0.70–1.25)
GFR, Est African American: >89 mL/min (ref >=60)
GFR, Est Non African American: 88 mL/min (ref >=60)
Glucose, Bld: 118 mg/dL — ABNORMAL HIGH (ref 70–99)
Potassium: 4 mmol/L (ref 3.5–5.3)
Sodium: 123 mmol/L — ABNORMAL LOW (ref 135–146)
Total Bilirubin: 1 mg/dL (ref 0.2–1.2)
Total Protein: 7.5 g/dL (ref 6.1–8.1)

## 2015-06-09 LAB — HEMOGLOBIN A1C
Hgb A1c MFr Bld: 5.9 % — ABNORMAL HIGH (ref <5.7)
Mean Plasma Glucose: 123 mg/dL — ABNORMAL HIGH (ref <117)

## 2015-06-09 MED ORDER — ZOSTER VACCINE LIVE 19400 UNT/0.65ML ~~LOC~~ SOLR: 0.6500 mL | Freq: Once | SUBCUTANEOUS | Status: DC

## 2015-06-09 MED ORDER — AMLODIPINE BESYLATE 5 MG PO TABS: 5.0000 mg | ORAL_TABLET | Freq: Every day | ORAL | Status: DC

## 2015-06-09 MED ORDER — POTASSIUM CHLORIDE ER 10 MEQ PO TBCR: 10.0000 meq | EXTENDED_RELEASE_TABLET | Freq: Every day | ORAL | Status: DC

## 2015-06-09 MED ORDER — FUROSEMIDE 20 MG PO TABS: 20.0000 mg | ORAL_TABLET | Freq: Every day | ORAL | Status: DC

## 2015-06-09 NOTE — Progress Notes (Signed)
Patient ID: DEMAURI ADVINCULA MRN: [id number], DOB: [birth year], 69 y.o. Date of Encounter: @DATE @  Chief Complaint:  Chief Complaint  Patient presents with  . Medication Management    Pt fasting  . Medication Refill    HPI: 69 y.o. year old whit[place] male  presents for f/u OV.  He has no complaints or concerns today.  States that the Flomax is still working very well. Some nights doesn't wake at all to urinate.  Also he states that he is still not taking the pravastatin. Says " I'm not taking no statin" At [place] 09/14/2014:   "However he also says that he has stopped the pravastatin because he thinks that this is causing the rash.     Also he says that in the past Dr. [name] had told him that his cholesterol was high but he also told him that his good cholesterol was high so this helped balance it out. Therefore patient            doesn't really think he needs cholesterol medicine and HE HAS DECIDED that he is NOT going to restart pravastatin."  He is taking blood pressure medications as directed with no adverse effects.  He is still taking the Protonix daily since he had duodenal ulcer couple years ago.  He has no complaints or concerns today.   Past Medical History  Diagnosis Date  . Celiac disease   . Hypertension   . Heart murmur   . Shortness of breath   . GERD (gastroesophageal reflux disease)   . 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 Anemia   . Multiple duodenal ulcers      Home Meds: Outpatient Prescriptions Prior to Visit  Medication Sig Dispense Refill  . amLODipine (NORVASC) 10 MG tablet TAKE 1 TABLET BY MOUTH EVERY DAY 30 tablet 0  . Cholecalciferol (VITAMIN D3) 1000 UNITS CAPS Take 1 capsule (1,000 Units total) by mouth daily. 30 capsule 11  . clotrimazole-betamethasone (LOTRISONE) cream APPLY EXTERNALLY TO THE AFFECTED AREA TWICE DAILY 45 g 0  . cyclobenzaprine (FLEXERIL) 10 MG tablet Take 10 mg by mouth every 8 (eight) hours as  needed. For muscle spasm.    Marland Kitchen lisinopril-hydrochlorothiazide (PRINZIDE,ZESTORETIC) 20-25 MG per tablet TAKE 1 TABLET BY MOUTH EVERY DAY 30 tablet 0  . pantoprazole (PROTONIX) 40 MG tablet TAKE 1 TABLET BY MOUTH TWICE DAILY BEFORE A MEAL 60 tablet 0  .      . tamsulosin (FLOMAX) 0.4 MG CAPS capsule TAKE ONE CAPSULE BY MOUTH DAILY 90 capsule 0   No facility-administered medications prior to visit.    Allergies:  Allergies  Allergen Reactions  . Gluten Meal Other (See Comments)    Celiac Disease    History   Social History  . Marital Status: Married    Spouse Name: N/A  . Number of Children: N/A  . Years of Education: N/A   Occupational History  . Not on file.   Social History Main Topics  . Smoking status: Former Smoker -- 50 years    Types: Cigarettes    Quit date: 05/06/2012  . Smokeless tobacco: Former Systems developer  . Alcohol Use: 0.0 oz/week    1-2 Glasses of wine per week     Comment: ocassional  . Drug Use: No  . Sexual Activity: No   Other Topics Concern  . Not on file   Social History Narrative   Lives alone. --as of 70/2015   Smokes e-cig. Has nicotine in  it. No real cigarette any more    Family History  Problem Relation Age of Onset  . Hodgkin's lymphoma Father      Review of Systems:  See HPI for pertinent ROS. All other ROS negative.    Physical Exam: Blood pressure 122/58, pulse 80, temperature 98 F (36.7 C), temperature source Oral, resp. rate 16, height 5' 9"  (1.753 m), weight 227 lb (102.967 kg)., Body mass index is 33.51 kg/(m^2). General: Abdominal Obesity. WM. Appears in no acute distress. Neck: Supple. No thyromegaly. No lymphadenopathy. Carotid Bruits Bilaterally.  Lungs: Clear bilaterally to auscultation without wheezes, rales, or rhonchi. Breathing is unlabored. Heart: RRR with S1 S2.  III/VI murmur at Right 2nd ICS. Musculoskeletal:  Strength and tone normal for age. Extremities/Skin:  2+ pitting pedal edema bilaterally. Neuro: Alert and  oriented X 3. Moves all extremities spontaneously. Gait is normal. CNII-XII grossly in tact. Psych:  Responds to questions appropriately with a normal affect.     ASSESSMENT AND PLAN:  69 y.o. year old male with    1. Bilateral lower extremity edema  He has significant edema on exam.  In case the Norvasc is contributing to this, will decrease dose from 10 mg down to 5 mg.  Also I see that he is on a combination pill that includes HCTZ. If he needs to stay on Lasix for more than one week, then will need to change this medication to plain ACE inhibitor and stop the HCTZ portion. However if swelling resolves within just 1 week , I do not want to make too many medication changes as he is going to get frustrated.  He is to take the Lasix 20 mg daily and potassium 10 mg quit 1 daily for 1 week. Schedule follow-up office visit 1-2 weeks. - amLODipine (NORVASC) 5 MG tablet; Take 1 tablet (5 mg total) by mouth daily.  Dispense: 30 tablet; Refill: 0 - furosemide (LASIX) 20 MG tablet; Take 1 tablet (20 mg total) by mouth daily.  Dispense: 30 tablet; Refill: 0 - potassium chloride (KLOR-CON 10) 10 MEQ tablet; Take 1 tablet (10 mEq total) by mouth daily.  Dispense: 30 tablet; Refill: 0  2. Essential hypertension - COMPLETE METABOLIC PANEL WITH GFR - amLODipine (NORVASC) 5 MG tablet; Take 1 tablet (5 mg total) by mouth daily.  Dispense: 30 tablet; Refill: 0  3. Hyperlipidemia - Lipid panel AT OV 09/14/2014--- he reported that he had stopped statin and did not plan to restart statin. At office visits 06/09/2015 he tells me the same thing. See history of present illness. He has been off statin ever since the November 2015 office visit. He is fasting today so we'll recheck one more time today. If his lipid panel stays stable compared to prior and may be able to stay off statin. However if his A1c is up again today and he is developing diabetes and he really is going to need statin and will have to have  that conversation again.  4. Hyperglycemia - COMPLETE METABOLIC PANEL WITH GFR - Hemoglobin A1c  5. Heart murmur Last echocardiogram was 12/03/2013. Showed aortic sclerosis without stenosis. No significant abnormality. EF 60-65%.  6. Carotid Bruit Last carotid Doppler was 11/20/2013. Showed 40-59% stenosis bilaterally. Recommended follow-up one year.  7. BPH (benign prostatic hypertrophy) Symptoms controlled with Flomax.  8. Celiac disease He controls this with gluten-free diet.  9. H/O Multiple duodenal ulcers - CBC with Differential/Platelet At the time of his anemia in 06/2012 endoscopy showed multiple duodenal ulcers  as the cause of the bleed. He has been on proton next twice a day since then. Continues to take this twice a day. He reports he is having no symptoms consistent with recurrent ulcers or anemia.  10. H/O Gastrointestinal hemorrhage associated with duodenal ulcer - CBC with Differential/Platelet  11.  Gastroesophageal reflux disease, esophagitis presence not specified   12. Vitamin D deficiency Vitamin D was low at 25 on labs 11/13/2013.  At office visit 06/09/2015 he states that he is not taking vitamin D and does not seem to be aware that he supposed to be taking this. -------------------WILL DISCUSS THIS WITH HIM AT NEXT OV---------------------------------  13. Prostate Cancer Screening PSA 08/13/2014 normal  14. Screening For Colorectal Cancer:  He reports he has had colonoscopies performed. He is uncertain of the exact date of the last colonoscopy but says that that was when they did a biopsy that confirmed celiac.  Says that otherwise the colonoscopy was normal. Had no polyps. Says that this was performed less than 10 years ago.   15. Immunizations:  Influenza Vaccine: --------Given here 09/14/2014 Tdap -------------------------Given here 09/14/2014 Pneumonia Vaccine: -----Prevnar 13 given here 11/13/2013                                 ------Pneumovax  23---Given Here 09/14/2014 Zostavax: Says his sister recently had Shingles and it was bad and he wants to get Shingles Vaccine--Rx Sent to Pharmacy at [place] 06/09/2015    Signed, [facility] [place], [place], [facility] 06/09/2015 9:45 AM

## 2015-06-10 ENCOUNTER — Telehealth: Payer: Self-pay | Admitting: Family Medicine

## 2015-06-10 ENCOUNTER — Other Ambulatory Visit: Payer: Self-pay | Admitting: Physician Assistant

## 2015-06-10 DIAGNOSIS — R945 Abnormal results of liver function studies: Secondary | ICD-10-CM

## 2015-06-10 DIAGNOSIS — R7989 Other specified abnormal findings of blood chemistry: Secondary | ICD-10-CM

## 2015-06-10 DIAGNOSIS — F1010 Alcohol abuse, uncomplicated: Secondary | ICD-10-CM | POA: Insufficient documentation

## 2015-06-10 MED ORDER — TAMSULOSIN HCL 0.4 MG PO CAPS: 0.4000 mg | ORAL_CAPSULE | Freq: Every day | ORAL | Status: DC

## 2015-06-10 NOTE — Telephone Encounter (Signed)
-----   Message from Orlena Sheldon, PA-C sent at 06/10/2015  7:54 AM EDT ----- Liver numbers are up. Find out if he is drinking any alcohol, taking any tylenol---LET ME KNOW.  If he is not using these products--then hold Pravastatin.  Will recheck labs 1-2 weeks.  Also sodium low. Fluid restrict---only 1 liter of fluid per day--includes milk in cereal etc.  Place future order for CMET--Tell pt te repeat in 1-2 weeks.

## 2015-06-10 NOTE — Telephone Encounter (Signed)
Pt made aware of lab results.  Admits to "drinking" too much.  Told him he needs to stop.  Also discussed low sodium and need to restrict non-alcohol fluids to 1 liter a day.  Need to repeat labs in 2 weeks, can just come to office and have done.

## 2015-06-29 ENCOUNTER — Inpatient Hospital Stay (HOSPITAL_COMMUNITY)
Admission: EM | Admit: 2015-06-29 | Discharge: 2015-07-13 | DRG: 040 | Disposition: A | Discharge status: Skilled Nursing Facility | Payer: Commercial Managed Care - HMO | Attending: Family Medicine | Admitting: Family Medicine

## 2015-06-29 ENCOUNTER — Emergency Department (HOSPITAL_COMMUNITY): Payer: Commercial Managed Care - HMO

## 2015-06-29 ENCOUNTER — Encounter (HOSPITAL_COMMUNITY): Payer: Self-pay | Admitting: Emergency Medicine

## 2015-06-29 DIAGNOSIS — M109 Gout, unspecified: Secondary | ICD-10-CM | POA: Diagnosis present

## 2015-06-29 DIAGNOSIS — R600 Localized edema: Secondary | ICD-10-CM | POA: Diagnosis not present

## 2015-06-29 DIAGNOSIS — R778 Other specified abnormalities of plasma proteins: Secondary | ICD-10-CM | POA: Insufficient documentation

## 2015-06-29 DIAGNOSIS — R092 Respiratory arrest: Secondary | ICD-10-CM | POA: Diagnosis not present

## 2015-06-29 DIAGNOSIS — N400 Enlarged prostate without lower urinary tract symptoms: Secondary | ICD-10-CM | POA: Diagnosis present

## 2015-06-29 DIAGNOSIS — F1099 Alcohol use, unspecified with unspecified alcohol-induced disorder: Secondary | ICD-10-CM | POA: Diagnosis not present

## 2015-06-29 DIAGNOSIS — I5032 Chronic diastolic (congestive) heart failure: Secondary | ICD-10-CM | POA: Diagnosis not present

## 2015-06-29 DIAGNOSIS — K219 Gastro-esophageal reflux disease without esophagitis: Secondary | ICD-10-CM | POA: Diagnosis present

## 2015-06-29 DIAGNOSIS — I10 Essential (primary) hypertension: Secondary | ICD-10-CM | POA: Diagnosis present

## 2015-06-29 DIAGNOSIS — D638 Anemia in other chronic diseases classified elsewhere: Secondary | ICD-10-CM | POA: Diagnosis present

## 2015-06-29 DIAGNOSIS — E871 Hypo-osmolality and hyponatremia: Secondary | ICD-10-CM

## 2015-06-29 DIAGNOSIS — N179 Acute kidney failure, unspecified: Secondary | ICD-10-CM

## 2015-06-29 DIAGNOSIS — S8391XA Sprain of unspecified site of right knee, initial encounter: Secondary | ICD-10-CM

## 2015-06-29 DIAGNOSIS — F10939 Alcohol use, unspecified with withdrawal, unspecified: Secondary | ICD-10-CM | POA: Diagnosis present

## 2015-06-29 DIAGNOSIS — R7989 Other specified abnormal findings of blood chemistry: Secondary | ICD-10-CM | POA: Insufficient documentation

## 2015-06-29 DIAGNOSIS — E878 Other disorders of electrolyte and fluid balance, not elsewhere classified: Secondary | ICD-10-CM | POA: Diagnosis present

## 2015-06-29 DIAGNOSIS — E559 Vitamin D deficiency, unspecified: Secondary | ICD-10-CM | POA: Diagnosis present

## 2015-06-29 DIAGNOSIS — M25561 Pain in right knee: Secondary | ICD-10-CM | POA: Diagnosis present

## 2015-06-29 DIAGNOSIS — E8729 Other acidosis: Secondary | ICD-10-CM | POA: Diagnosis present

## 2015-06-29 DIAGNOSIS — Z452 Encounter for adjustment and management of vascular access device: Secondary | ICD-10-CM | POA: Diagnosis not present

## 2015-06-29 DIAGNOSIS — I5033 Acute on chronic diastolic (congestive) heart failure: Secondary | ICD-10-CM | POA: Diagnosis not present

## 2015-06-29 DIAGNOSIS — F10239 Alcohol dependence with withdrawal, unspecified: Secondary | ICD-10-CM | POA: Diagnosis present

## 2015-06-29 DIAGNOSIS — Z95818 Presence of other cardiac implants and grafts: Secondary | ICD-10-CM

## 2015-06-29 DIAGNOSIS — J9809 Other diseases of bronchus, not elsewhere classified: Secondary | ICD-10-CM | POA: Diagnosis not present

## 2015-06-29 DIAGNOSIS — I472 Ventricular tachycardia: Secondary | ICD-10-CM | POA: Diagnosis not present

## 2015-06-29 DIAGNOSIS — R0602 Shortness of breath: Secondary | ICD-10-CM

## 2015-06-29 DIAGNOSIS — K900 Celiac disease: Secondary | ICD-10-CM | POA: Diagnosis present

## 2015-06-29 DIAGNOSIS — E876 Hypokalemia: Secondary | ICD-10-CM

## 2015-06-29 DIAGNOSIS — S83401A Sprain of unspecified collateral ligament of right knee, initial encounter: Secondary | ICD-10-CM | POA: Diagnosis not present

## 2015-06-29 DIAGNOSIS — R740 Nonspecific elevation of levels of transaminase and lactic acid dehydrogenase [LDH]: Secondary | ICD-10-CM

## 2015-06-29 DIAGNOSIS — Z79899 Other long term (current) drug therapy: Secondary | ICD-10-CM | POA: Diagnosis not present

## 2015-06-29 DIAGNOSIS — E860 Dehydration: Secondary | ICD-10-CM | POA: Diagnosis present

## 2015-06-29 DIAGNOSIS — I442 Atrioventricular block, complete: Secondary | ICD-10-CM | POA: Diagnosis not present

## 2015-06-29 DIAGNOSIS — Z87891 Personal history of nicotine dependence: Secondary | ICD-10-CM

## 2015-06-29 DIAGNOSIS — I469 Cardiac arrest, cause unspecified: Secondary | ICD-10-CM | POA: Diagnosis not present

## 2015-06-29 DIAGNOSIS — R2689 Other abnormalities of gait and mobility: Secondary | ICD-10-CM | POA: Diagnosis not present

## 2015-06-29 DIAGNOSIS — I422 Other hypertrophic cardiomyopathy: Secondary | ICD-10-CM | POA: Diagnosis not present

## 2015-06-29 DIAGNOSIS — D509 Iron deficiency anemia, unspecified: Secondary | ICD-10-CM | POA: Diagnosis present

## 2015-06-29 DIAGNOSIS — J90 Pleural effusion, not elsewhere classified: Secondary | ICD-10-CM | POA: Diagnosis not present

## 2015-06-29 DIAGNOSIS — E785 Hyperlipidemia, unspecified: Secondary | ICD-10-CM | POA: Diagnosis present

## 2015-06-29 DIAGNOSIS — K7010 Alcoholic hepatitis without ascites: Secondary | ICD-10-CM | POA: Diagnosis present

## 2015-06-29 DIAGNOSIS — R569 Unspecified convulsions: Secondary | ICD-10-CM | POA: Diagnosis not present

## 2015-06-29 DIAGNOSIS — I639 Cerebral infarction, unspecified: Secondary | ICD-10-CM | POA: Diagnosis not present

## 2015-06-29 DIAGNOSIS — Z91018 Allergy to other foods: Secondary | ICD-10-CM

## 2015-06-29 DIAGNOSIS — I421 Obstructive hypertrophic cardiomyopathy: Secondary | ICD-10-CM | POA: Diagnosis not present

## 2015-06-29 DIAGNOSIS — R7401 Elevation of levels of liver transaminase levels: Secondary | ICD-10-CM | POA: Diagnosis present

## 2015-06-29 DIAGNOSIS — I519 Heart disease, unspecified: Secondary | ICD-10-CM | POA: Diagnosis present

## 2015-06-29 DIAGNOSIS — R609 Edema, unspecified: Secondary | ICD-10-CM

## 2015-06-29 DIAGNOSIS — F10230 Alcohol dependence with withdrawal, uncomplicated: Secondary | ICD-10-CM | POA: Diagnosis not present

## 2015-06-29 DIAGNOSIS — F1010 Alcohol abuse, uncomplicated: Secondary | ICD-10-CM | POA: Diagnosis not present

## 2015-06-29 DIAGNOSIS — M6281 Muscle weakness (generalized): Secondary | ICD-10-CM | POA: Diagnosis not present

## 2015-06-29 DIAGNOSIS — I429 Cardiomyopathy, unspecified: Secondary | ICD-10-CM | POA: Diagnosis not present

## 2015-06-29 DIAGNOSIS — Z8711 Personal history of peptic ulcer disease: Secondary | ICD-10-CM | POA: Diagnosis not present

## 2015-06-29 DIAGNOSIS — D649 Anemia, unspecified: Secondary | ICD-10-CM | POA: Diagnosis not present

## 2015-06-29 DIAGNOSIS — G40909 Epilepsy, unspecified, not intractable, without status epilepticus: Secondary | ICD-10-CM | POA: Diagnosis not present

## 2015-06-29 DIAGNOSIS — E872 Acidosis: Secondary | ICD-10-CM | POA: Diagnosis present

## 2015-06-29 DIAGNOSIS — F10231 Alcohol dependence with withdrawal delirium: Secondary | ICD-10-CM | POA: Diagnosis not present

## 2015-06-29 DIAGNOSIS — I270 Primary pulmonary hypertension: Secondary | ICD-10-CM | POA: Diagnosis not present

## 2015-06-29 DIAGNOSIS — S8001XA Contusion of right knee, initial encounter: Secondary | ICD-10-CM | POA: Diagnosis not present

## 2015-06-29 DIAGNOSIS — R0600 Dyspnea, unspecified: Secondary | ICD-10-CM

## 2015-06-29 HISTORY — DX: Bradycardia, unspecified: R00.1

## 2015-06-29 LAB — BASIC METABOLIC PANEL
Anion gap: 13 (ref 5–15)
Anion gap: 13 (ref 5–15)
BUN: 9 mg/dL (ref 6–20)
BUN: 10 mg/dL (ref 6–20)
CO2: 31 mmol/L (ref 22–32)
CO2: 30 mmol/L (ref 22–32)
Calcium: 8.7 mg/dL — ABNORMAL LOW (ref 8.9–10.3)
Calcium: 8.9 mg/dL (ref 8.9–10.3)
Chloride: 76 mmol/L — ABNORMAL LOW (ref 101–111)
Chloride: 83 mmol/L — ABNORMAL LOW (ref 101–111)
Creatinine, Ser: 1.37 mg/dL — ABNORMAL HIGH (ref 0.61–1.24)
Creatinine, Ser: 1.32 mg/dL — ABNORMAL HIGH (ref 0.61–1.24)
GFR calc Af Amer: 59 mL/min — ABNORMAL LOW (ref >60)
GFR calc Af Amer: >60 mL/min (ref >60)
GFR calc non Af Amer: 51 mL/min — ABNORMAL LOW (ref >60)
GFR calc non Af Amer: 53 mL/min — ABNORMAL LOW (ref >60)
Glucose, Bld: 106 mg/dL — ABNORMAL HIGH (ref 65–99)
Glucose, Bld: 129 mg/dL — ABNORMAL HIGH (ref 65–99)
Potassium: 3 mmol/L — ABNORMAL LOW (ref 3.5–5.1)
Potassium: 3.1 mmol/L — ABNORMAL LOW (ref 3.5–5.1)
Sodium: 120 mmol/L — ABNORMAL LOW (ref 135–145)
Sodium: 126 mmol/L — ABNORMAL LOW (ref 135–145)

## 2015-06-29 LAB — RAPID URINE DRUG SCREEN, HOSP PERFORMED
Amphetamines: NOT DETECTED
Barbiturates: NOT DETECTED
Benzodiazepines: NOT DETECTED
Cocaine: NOT DETECTED
Opiates: NOT DETECTED
Tetrahydrocannabinol: NOT DETECTED

## 2015-06-29 LAB — COMPREHENSIVE METABOLIC PANEL
ALT: 55 U/L (ref 17–63)
AST: 97 U/L — ABNORMAL HIGH (ref 15–41)
Albumin: 3.5 g/dL (ref 3.5–5.0)
Alkaline Phosphatase: 61 U/L (ref 38–126)
Anion gap: 19 — ABNORMAL HIGH (ref 5–15)
BUN: 10 mg/dL (ref 6–20)
CO2: 25 mmol/L (ref 22–32)
Calcium: 8.6 mg/dL — ABNORMAL LOW (ref 8.9–10.3)
Chloride: 76 mmol/L — ABNORMAL LOW (ref 101–111)
Creatinine, Ser: 1.63 mg/dL — ABNORMAL HIGH (ref 0.61–1.24)
GFR calc Af Amer: 48 mL/min — ABNORMAL LOW (ref >60)
GFR calc non Af Amer: 41 mL/min — ABNORMAL LOW (ref >60)
Glucose, Bld: 155 mg/dL — ABNORMAL HIGH (ref 65–99)
Potassium: 2.9 mmol/L — ABNORMAL LOW (ref 3.5–5.1)
Sodium: 120 mmol/L — ABNORMAL LOW (ref 135–145)
Total Bilirubin: 1.5 mg/dL — ABNORMAL HIGH (ref 0.3–1.2)
Total Protein: 6.6 g/dL (ref 6.5–8.1)

## 2015-06-29 LAB — CBC WITH DIFFERENTIAL/PLATELET
Basophils Absolute: 0 10*3/uL (ref 0.0–0.1)
Basophils Relative: 0 % (ref 0–1)
Eosinophils Absolute: 0 10*3/uL (ref 0.0–0.7)
Eosinophils Relative: 0 % (ref 0–5)
HCT: 38.4 % — ABNORMAL LOW (ref 39.0–52.0)
Hemoglobin: 14.1 g/dL (ref 13.0–17.0)
Lymphocytes Relative: 5 % — ABNORMAL LOW (ref 12–46)
Lymphs Abs: 0.5 10*3/uL — ABNORMAL LOW (ref 0.7–4.0)
MCH: 30.5 pg (ref 26.0–34.0)
MCHC: 36.7 g/dL — ABNORMAL HIGH (ref 30.0–36.0)
MCV: 82.9 fL (ref 78.0–100.0)
Monocytes Absolute: 0.6 10*3/uL (ref 0.1–1.0)
Monocytes Relative: 6 % (ref 3–12)
Neutro Abs: 8.9 10*3/uL — ABNORMAL HIGH (ref 1.7–7.7)
Neutrophils Relative %: 89 % — ABNORMAL HIGH (ref 43–77)
Platelets: 260 10*3/uL (ref 150–400)
RBC: 4.63 MIL/uL (ref 4.22–5.81)
RDW: 13.8 % (ref 11.5–15.5)
WBC: 10 10*3/uL (ref 4.0–10.5)

## 2015-06-29 LAB — OSMOLALITY, URINE: Osmolality, Ur: 315 mOsm/kg — ABNORMAL LOW (ref 390–1090)

## 2015-06-29 LAB — ETHANOL: Alcohol, Ethyl (B): <5 mg/dL (ref <5)

## 2015-06-29 LAB — SODIUM, URINE, RANDOM: Sodium, Ur: 36 mmol/L

## 2015-06-29 LAB — OSMOLALITY: Osmolality: 251 mOsm/kg — ABNORMAL LOW (ref 275–300)

## 2015-06-29 LAB — PHOSPHORUS: Phosphorus: 3.2 mg/dL (ref 2.5–4.6)

## 2015-06-29 LAB — TSH: TSH: 1.477 u[IU]/mL (ref 0.350–4.500)

## 2015-06-29 LAB — CREATININE, URINE, RANDOM: Creatinine, Urine: 123.21 mg/dL

## 2015-06-29 LAB — GLUCOSE, CAPILLARY: Glucose-Capillary: 146 mg/dL — ABNORMAL HIGH (ref 65–99)

## 2015-06-29 LAB — MAGNESIUM: Magnesium: 1.3 mg/dL — ABNORMAL LOW (ref 1.7–2.4)

## 2015-06-29 LAB — MRSA PCR SCREENING: MRSA by PCR: NEGATIVE

## 2015-06-29 MED ORDER — VITAMIN D 1000 UNITS PO TABS
1000.0000 [IU] | ORAL_TABLET | Freq: Every day | ORAL | Status: DC
Start: 2015-06-29 — End: 2015-07-13
  Administered 2015-06-29 – 2015-07-13 (×15): 1000 [IU] via ORAL
  Filled 2015-06-29 (×15): qty 1

## 2015-06-29 MED ORDER — MAGNESIUM SULFATE 2 GM/50ML IV SOLN
2.0000 g | Freq: Once | INTRAVENOUS | Status: AC
  Administered 2015-06-29: 2 g via INTRAVENOUS
  Filled 2015-06-29: qty 50

## 2015-06-29 MED ORDER — TAMSULOSIN HCL 0.4 MG PO CAPS
0.4000 mg | ORAL_CAPSULE | Freq: Every day | ORAL | Status: DC
  Administered 2015-06-29 – 2015-07-13 (×15): 0.4 mg via ORAL
  Filled 2015-06-29 (×15): qty 1

## 2015-06-29 MED ORDER — AMLODIPINE BESYLATE 5 MG PO TABS
5.0000 mg | ORAL_TABLET | Freq: Every day | ORAL | Status: DC
  Administered 2015-06-29 – 2015-06-30 (×2): 5 mg via ORAL
  Filled 2015-06-29 (×2): qty 1

## 2015-06-29 MED ORDER — ENOXAPARIN SODIUM 40 MG/0.4ML ~~LOC~~ SOLN
40.0000 mg | SUBCUTANEOUS | Status: DC
  Administered 2015-06-29 – 2015-07-13 (×15): 40 mg via SUBCUTANEOUS
  Filled 2015-06-29 (×16): qty 0.4

## 2015-06-29 MED ORDER — LORAZEPAM 2 MG/ML IJ SOLN
2.0000 mg | Freq: Once | INTRAMUSCULAR | Status: AC
  Administered 2015-06-29: 2 mg via INTRAVENOUS
  Filled 2015-06-29: qty 1

## 2015-06-29 MED ORDER — ONDANSETRON HCL 4 MG PO TABS: 4.0000 mg | ORAL_TABLET | Freq: Four times a day (QID) | ORAL | Status: DC | PRN

## 2015-06-29 MED ORDER — FOLIC ACID 1 MG PO TABS
1.0000 mg | ORAL_TABLET | Freq: Every day | ORAL | Status: DC
  Administered 2015-06-29 – 2015-07-13 (×15): 1 mg via ORAL
  Filled 2015-06-29 (×15): qty 1

## 2015-06-29 MED ORDER — ACETAMINOPHEN 650 MG RE SUPP: 650.0000 mg | Freq: Four times a day (QID) | RECTAL | Status: DC | PRN

## 2015-06-29 MED ORDER — POTASSIUM CHLORIDE IN NACL 20-0.9 MEQ/L-% IV SOLN
INTRAVENOUS | Status: DC
  Administered 2015-06-29 – 2015-07-01 (×5): via INTRAVENOUS
  Filled 2015-06-29 (×11): qty 1000

## 2015-06-29 MED ORDER — VITAMIN B-1 100 MG PO TABS
100.0000 mg | ORAL_TABLET | Freq: Every day | ORAL | Status: DC
  Administered 2015-06-29 – 2015-07-13 (×15): 100 mg via ORAL
  Filled 2015-06-29 (×16): qty 1

## 2015-06-29 MED ORDER — ONDANSETRON HCL 4 MG/2ML IJ SOLN
4.0000 mg | Freq: Four times a day (QID) | INTRAMUSCULAR | Status: DC | PRN
  Administered 2015-07-08: 4 mg via INTRAVENOUS
  Filled 2015-06-29: qty 2

## 2015-06-29 MED ORDER — ALUM & MAG HYDROXIDE-SIMETH 200-200-20 MG/5ML PO SUSP: 30.0000 mL | Freq: Four times a day (QID) | ORAL | Status: DC | PRN

## 2015-06-29 MED ORDER — DOCUSATE SODIUM 100 MG PO CAPS
100.0000 mg | ORAL_CAPSULE | Freq: Two times a day (BID) | ORAL | Status: DC
  Administered 2015-06-29 – 2015-07-13 (×26): 100 mg via ORAL
  Filled 2015-06-29 (×28): qty 1

## 2015-06-29 MED ORDER — FOLIC ACID 5 MG/ML IJ SOLN: 1.0000 mg | Freq: Every day | INTRAMUSCULAR | Status: DC

## 2015-06-29 MED ORDER — SODIUM CHLORIDE 0.9 % IJ SOLN
3.0000 mL | Freq: Two times a day (BID) | INTRAMUSCULAR | Status: DC
  Administered 2015-06-30 – 2015-07-12 (×23): 3 mL via INTRAVENOUS

## 2015-06-29 MED ORDER — LORAZEPAM 2 MG/ML IJ SOLN
2.0000 mg | INTRAMUSCULAR | Status: DC | PRN
  Administered 2015-06-30 – 2015-07-01 (×11): 2 mg via INTRAVENOUS
  Filled 2015-06-29 (×11): qty 1

## 2015-06-29 MED ORDER — ACETAMINOPHEN 325 MG PO TABS: 650.0000 mg | ORAL_TABLET | Freq: Four times a day (QID) | ORAL | Status: DC | PRN

## 2015-06-29 MED ORDER — THIAMINE HCL 100 MG/ML IJ SOLN
100.0000 mg | Freq: Every day | INTRAMUSCULAR | Status: DC
  Filled 2015-06-29: qty 2

## 2015-06-29 MED ORDER — POTASSIUM CHLORIDE CRYS ER 20 MEQ PO TBCR
40.0000 meq | EXTENDED_RELEASE_TABLET | Freq: Once | ORAL | Status: AC
  Administered 2015-06-29: 40 meq via ORAL
  Filled 2015-06-29: qty 2

## 2015-06-29 MED ORDER — PANTOPRAZOLE SODIUM 40 MG PO TBEC
40.0000 mg | DELAYED_RELEASE_TABLET | Freq: Every day | ORAL | Status: DC
  Administered 2015-06-29 – 2015-07-13 (×15): 40 mg via ORAL
  Filled 2015-06-29 (×15): qty 1

## 2015-06-29 NOTE — H&P (Signed)
[facility] History and Physical                                                                                    Francisco [name], is a 69 y.o. male  MRN: [id number]   DOB - 10-14-1946  Admit Date - 06/29/2015  Outpatient Primary MD for the patient is Karis Juba, PA-C  Referring MD: Talmage Nap  With History of -  Past Medical History  Diagnosis Date  . Celiac disease   . Hypertension   . Heart murmur   . Shortness of breath   . GERD (gastroesophageal reflux disease)   . 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 Anemia   . Multiple duodenal ulcers       Past Surgical History  Procedure Laterality Date  . Hemorrhoid surgery    . Mole removal    . Hernia repair    . Esophagogastroduodenoscopy  07/03/2012    Procedure: ESOPHAGOGASTRODUODENOSCOPY (EGD);  Surgeon: Winfield Cunas., MD;  Location: [facility] ENDOSCOPY;  Service: Endoscopy;  Laterality: N/A;    in for   Chief Complaint  Patient presents with  . 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 Patient has no recollection of seizure activity but an individual at the home reported the patient had fallen on the floor secondary to seizure-like activity and reports the patient had a similar episode one year prior and is not typically on antiseizure medications. EMS arrived to the home and patient was postictal for about 10-15 minutes. He was started on 4 L nasal cannula oxygen with sats 98%. After postictal phase resolved patient was alert and oriented 4. His temp have a skin tear  on the right elbow as well as redness and swelling of the right knee. He was otherwise hemodynamically stable prior to transport.  In the ER patient was afebrile, BP was 141/62, pulse rate 103 respirations 16 Glasgow Coma Scale 20, patient is on 2 L nasal cannula oxygen with saturations of 100%. Laboratory data revealed a sodium of 120, potassium of 2.9, chloride 76, creatinine 1.63, BUN 10, glucose 155, anion gap 19, AST 97, total bilirubin 1.5. Alcohol level was less than 5 Hemoglobin 14, platelets and white count normal. Refills 89%. Recent outpatient laboratory data sodium 123 on 8/3 with normal renal function BUN 8 and creatinine 0.88. AST at that time was 121 with an ALT of 60 and a normal total bilirubin. CBC was unremarkable at that time as well. Patient had a lipid panel done then with cholesterol 201 otherwise normal lipid panel and hemoglobin A1c was 5.9. In the ER here patient had a CT of the head which was unremarkable. Plain films of the right knee showed no acute injury. EKG sinus rhythm and underlying right bundle branch block, pulse 103, QTC 465 ms,  occasional PAC, no ST segment or T-wave changes concerning for ischemia.  In review of the outpatient records patient presented on 8/3 to primary care physician for medication management and medication refill evaluation. He was complaining of lower extremity edema which was noted on exam therefore Norvasc was decreased from 10 g to 5 mg. He was continued on his lisinopril hydrochlorothiazide but low-dose Lasix with potassium was added to be utilized for total of 1 week. Laboratory data obtained on that visit as above with a sodium of 123 and elevated transaminases. On 8/4 the patient was called regarding his lab work and was question regarding his alcohol intake and was told to hold his pravastatin because of the elevated transaminases. In addition he was instructed that his sodium was low and was informed to begin fluid restriction with only 1 L of  fluid per day including milk and cereal etc. On 8/4 patient admitted to the primary care physician's LPN that he was drinking too much; was also informed about the low sodium and need to restrict fluids to 1 L per day as well as return to the office in 2 weeks for labs. In discussing with the patient today he told us that he was recently told that his sodium was too high and he basically "stopped using salt altogether".  Review of Systems   In addition to the HPI above,  No Fever-chills, myalgias or other constitutional symptoms No Headache, changes with Vision or hearing, new weakness, tingling, numbness in any extremity, No problems swallowing food or Liquids, indigestion/reflux No Chest pain, Cough or Shortness of Breath, palpitations, orthopnea or DOE No Abdominal pain, N/V; no melena or hematochezia, no dark tarry stools, Bowel movements are regular, No dysuria, hematuria or flank pain No new skin rashes, lesions, masses or bruises, No new joints pains-aches No recent weight gain or loss No polyuria, polydypsia or polyphagia,  *A full 10 point Review of Systems was done, except as stated above, all other Review of Systems were negative.  Social History Social History  Substance Use Topics  . Smoking status: Former Smoker -- 0.50 packs/day for 50 years    Types: Cigarettes-currently "vapes"   . Smokeless tobacco: Former Systems developer  . Alcohol Use:  1 pint of liquor per day              Resides at: Private residence  Lives with: Roommate's  Ambulatory status: Without assistive devices   Family History Family History  Problem Relation Age of Onset  . Hodgkin's lymphoma MI  Father Father       Prior to Admission medications   Medication Sig Start Date End Date Taking? Authorizing Provider  amLODipine (NORVASC) 5 MG tablet Take 1 tablet (5 mg total) by mouth daily. 06/09/15  Yes Orlena Sheldon, PA-C  Cholecalciferol (VITAMIN D3) 1000 UNITS CAPS Take 1 capsule (1,000 Units total)  by mouth daily. 11/18/13  Yes Mary B Dixon, PA-C  cyclobenzaprine (FLEXERIL) 10 MG tablet Take 10 mg by mouth every 8 (eight) hours as needed. For muscle spasm.   Yes Historical Provider, MD  furosemide (LASIX) 20 MG tablet Take 1 tablet (20 mg total) by mouth daily. 06/09/15  Yes Orlena Sheldon, PA-C  lisinopril-hydrochlorothiazide (PRINZIDE,ZESTORETIC) 20-25 MG per tablet TAKE 1 TABLET BY MOUTH EVERY DAY 05/27/15  Yes Orlena Sheldon, PA-C  pantoprazole (PROTONIX) 40 MG tablet TAKE 1 TABLET BY MOUTH TWICE DAILY BEFORE A MEAL 05/27/15  Yes Orlena Sheldon, PA-C  pravastatin (PRAVACHOL) 40 MG  tablet TAKE 1 TABLET BY MOUTH EVERY EVENING 12/04/14  Yes Orlena Sheldon, PA-C  tamsulosin (FLOMAX) 0.4 MG CAPS capsule Take 1 capsule (0.4 mg total) by mouth daily. 06/10/15  Yes Mary B Dixon, PA-C  potassium chloride (KLOR-CON 10) 10 MEQ tablet Take 1 tablet (10 mEq total) by mouth daily. Patient not taking: Reported on 06/29/2015 06/09/15   Orlena Sheldon, PA-C    Allergies  Allergen Reactions  . Gluten Meal Other (See Comments)    Celiac Disease    Physical Exam  Vitals  Blood pressure 108/47, pulse 84, temperature 98.5 F (36.9 C), temperature source Oral, resp. rate 15, height 5' 9.5" (1.765 m), weight 226 lb (102.513 kg), SpO2 97 %.   General:  In no acute distress, appears fatigued  Psych:  Flat affect, Denies Suicidal or Homicidal ideations, Awake Alert, Oriented X 3. Speech and thought patterns are clear and appropriate, no apparent short term memory deficits  Neuro:   No focal neurological deficits, CN II through XII intact, Strength 5/5 all 4 extremities, Sensation intact all 4 extremities. No tremulous activity observed  ENT:  Ears and Eyes appear Normal, Conjunctivae clear, sclera markedly injected, PER. Dry oral mucosa without erythema or exudates.  Neck:  Supple, No lymphadenopathy appreciated  Respiratory:  Symmetrical chest wall movement, Good air movement bilaterally, CTAB. Room Air  Cardiac:   RRR, No Murmurs, trace bilateral LE edema noted, no JVD, No carotid bruits, peripheral pulses palpable at 2+  Abdomen:  Positive bowel sounds, Soft, Non tender, Non distended,  No masses appreciated, no obvious hepatosplenomegaly  Skin:  No Cyanosis, Normal Skin Turgor, No Skin Rash or Bruise.  Extremities: Symmetrical with reddened skin changes both knees greater on right,  right knee tender to touch no effusions.  Data Review  CBC  Recent Labs Lab 06/29/15 0615  WBC 10.0  HGB 14.1  HCT 38.4*  PLT 260  MCV 82.9  MCH 30.5  MCHC 36.7*  RDW 13.8  LYMPHSABS 0.5*  MONOABS 0.6  EOSABS 0.0  BASOSABS 0.0    Chemistries   Recent Labs Lab 06/29/15 0615  NA 120*  K 2.9*  CL 76*  CO2 25  GLUCOSE 155*  BUN 10  CREATININE 1.63*  CALCIUM 8.6*  AST 97*  ALT 55  ALKPHOS 61  BILITOT 1.5*    estimated creatinine clearance is 50.9 mL/min (by C-G formula based on Cr of 1.63).  No results for input(s): TSH, T4TOTAL, T3FREE, THYROIDAB in the last 72 hours.  Invalid input(s): FREET3  Coagulation profile No results for input(s): INR, PROTIME in the last 168 hours.  No results for input(s): DDIMER in the last 72 hours.  Cardiac Enzymes No results for input(s): CKMB, TROPONINI, MYOGLOBIN in the last 168 hours.  Invalid input(s): CK  Invalid input(s): POCBNP  Urinalysis    Component Value Date/Time   COLORURINE YELLOW 08/13/2014 1010   APPEARANCEUR CLEAR 08/13/2014 1010   LABSPEC 1.017 08/13/2014 1010   PHURINE 6.0 08/13/2014 1010   GLUCOSEU NEG 08/13/2014 1010   HGBUR NEG 08/13/2014 1010   BILIRUBINUR SMALL* 08/13/2014 1010   KETONESUR 15* 08/13/2014 1010   PROTEINUR NEG 08/13/2014 1010   UROBILINOGEN 0.2 08/13/2014 1010   NITRITE NEG 08/13/2014 1010   LEUKOCYTESUR NEG 08/13/2014 1010    Imaging results:   Ct Head Wo Contrast  06/29/2015   CLINICAL DATA:  Seizure.  Alcohol abuse.  EXAM: CT HEAD WITHOUT CONTRAST  TECHNIQUE: Contiguous axial images were  obtained from the  base of the skull through the vertex without intravenous contrast.  COMPARISON:  Head CT and brain MRI 11/19/2005  FINDINGS: No intracranial hemorrhage, mass effect, or midline shift. No hydrocephalus. The basilar cisterns are patent. No evidence of territorial infarct. No intracranial fluid collection. Generalized cerebral and cerebellar atrophy. Mild chronic small vessel ischemic change. Small lacunar infarct in the left basal ganglia, unchanged. Calvarium is intact. Included paranasal sinuses and mastoid air cells are well aerated.  IMPRESSION: Atrophy and chronic small vessel ischemic change. No acute intracranial abnormality.   Electronically Signed   By: Jeb Levering M.D.   On: 06/29/2015 06:56   Dg Knee Complete 4 Views Right  06/29/2015   CLINICAL DATA:  Unexplained right knee pain following a seizure  EXAM: RIGHT KNEE - COMPLETE 4+ VIEW  COMPARISON:  Thin un in PACs  FINDINGS: The bones are adequately mineralized. The joint spaces are preserved. There is no acute fracture or dislocation. There is no joint effusion. There is cortical thickening and irregularity associated with the meta diaphysis of the proximal fibula which likely reflects previous trauma. No acute fracture line is observed. There is popliteal artery calcification. There is calcification at the insertion of the quadriceps tendon and origin of the patellar tendons  IMPRESSION: No acute bony abnormality of the right knee is observed. If there are clinical concerns of significant soft tissue injury-internal derangement, MRI would be the most useful imaging step.   Electronically Signed   By: David  Martinique M.D.   On: 06/29/2015 07:30     EKG: (Independently reviewed) sinus rhythm with underlying right bundle branch block occasional PAC, ventricular rate 103, QTC 465 ms, no acute ST or T-wave segment changes would be concerning for ischemia   Assessment & Plan  Principal Problem:   Alcohol withdrawal seizure/  Alcohol abuse, daily use -Admit to stepdown -Seizure precautions -NPO 4 hours then allow regular diet -CIWA -Suspect patient minimizing actual amount of alcohol he typically ingests-previously has told healthcare providers he only drinks 1-2 drinks per week -Check magnesium and phosphorus levels -Please note alcohol level was less than 5 at presentation; patient reports that his last drink was Monday evening so suspect patient requires a steady ingestion of alcohol to prevent any type of withdrawal symptoms at home  Active Problems:   HTN  -Moderately controlled -Continue Norvasc but hold lisinopril setting of acute renal failure -Discontinue hydrochlorothiazide and setting of hyponatremia    Hyponatremia -Has a chronic component dating back to 2013 with readings typically in the 130s -Suspect continuing to worsen as an outpatient related to ongoing alcohol abuse and use of thiazide diuretic and has worsened in the past 3 weeks secondary to patient restricting sodium at home -As precaution check urine sodium and creatinine as well as serum and urine osmolality to rule out true SIADH -Suspect underlying volume depletion from ongoing alcohol use so once above labs have been obtained we'll begin IV fluids at 100 mL per hour   Acute renal failure/anion gap acidosis -Baseline renal function BUN 8 and creatinine 0.88 several weeks ago -Current renal function BUN 10 and creatinine 1.63 -Acidosis likely combination of preadmission use of ACE inhibitor and setting of dehydration -IV fluids as above -Repeat labs in a.m.   Mild transaminitis -Likely from ongoing alcohol abuse as well as volume depletion -Does have mild elevation in total bilirubin but no abdominal pain so doubt cholecystitis -Hold statin-patient has previously refused to take in the past because of presumed rash-even underlying alcohol  abuse likely does not need to continue statin -Check acute hepatitis panel     Hypokalemia -Likely secondary to use of thiazide and loop diuretic prior to admission -Hold diuretics -Oral replete and utilize potassium and IV fluids    Acute pain of right knee -X-ray without any acute bony injury and no evidence of effusion noted -PT evaluation    Microcytic anemia -Current hemoglobin stable    Bilateral lower extremity edema (chronic) -Outpatient M.D. suspected related to Norvasc so dose was recently decreased -Currently only has trace edema on exam    Hypertrophic obstructive cardiomyopathy (HOCM)/Mild diastolic dysfunction -Currently asymptomatic and without respiratory symptoms -Noted on echocardiogram January 2015; echocardiogram was done because of apex murmur -Patient was on ACE inhibitor with thiazide diuretic and recent introduction of low dose Lasix prior to admission -Given alcoholism and recent issues with hyponatremia suspect patient would benefit from prn utilization of diuretics based on edema/weight gain or respiratory symptoms consistent with heart failure as opposed to regularly dosed diuretics -Likely would benefit from outpatient referral to cardiology for further evaluation    GERD  -Continue PPI    BPH -Continue Flomax    Vitamin D deficiency -Continue oral vitamin D    Hyperlipidemia -Statin on hold secondary to transaminitis -Given underlying recalcitrant alcoholism with transaminitis likely would not resume statin and future    DVT Prophylaxis: Lovenox  Family Communication:   No family at bedside  Code Status:  Full code  Condition:  Stable  Discharge disposition: Timing of discharge dependent upon patient's response to alcohol withdrawal protocols in whether he has additional seizure activity  Time spent in minutes : 60      ELLIS,ALLISON L. ANP on 06/29/2015 at 8:21 AM  Between 7am to 7pm - Pager - [phone]  After 7pm go to [email or web address] - password [email or web address]  And look for the night coverage person covering me  after hours  [facility]  Addendum  I personally evaluated patient on 06/29/2015 and agree with the above findings. Patient is a 69 year old gentleman with a past medical history of alcoholism who was found by a bystander on the floor having seizure-like activity. EMS called found him to be in a postictal state and was brought to the emergency department. Workup showed a sodium of 120 with creatinine of 1.63. CT scan of brain did not show acute intracranial abnormalities. His alcohol level was undetectable. He reported drinking vodka on a daily basis. I suspect seizures are alcohol withdrawal related. Doubt seizures would be secondary to hyponatremia. Neurologic symptoms are likely to occur with acute onset hyponatremia and this patient had a sodium level of 123 on 06/09/2015. Hyponatremia could be related to hypovolemia resulting from combination of Lasix therapy, alcohol abuse and malnourished state which also led to acute kidney injury and hypokalemia. On exam he was awake and alert able to provide history. Had erythema over right knee. Will hydrate with normal saline, correct electrolytes, admit him to the step down unit with CIWA protocol.

## 2015-06-29 NOTE — ED Provider Notes (Signed)
CSN: [id number]     Arrival date & time 06/29/15  0536 History   First MD Initiated Contact with Patient 06/29/15 [phone]     Chief Complaint  Patient presents with  . Seizures    Patient is a 69 y.o. male presenting with seizures. The history is provided by the patient and the EMS personnel.  Seizures Seizure activity on arrival: no   Episode characteristics: confusion   Postictal symptoms: confusion   Return to baseline: yes   Severity:  Moderate Timing:  Once Number of seizures this episode:  1 Context: alcohol withdrawal   Recent head injury:  No recent head injuries PTA treatment:  None History of seizures: no    Pt presents for possible seizure activity Per EMS, an acquaintance at his residence called 911 as it was noted he was having seizure activity On arrival, EMS noted patient had postictal confusion He is now back to baseline No meds given No known SZ history However pt admits to daily ETOH - pint of liquor per day He reports he last drank yesterday He also reports right knee pain after seizure Past Medical History  Diagnosis Date  . Celiac disease   . Hypertension   . Heart murmur   . Shortness of breath   . GERD (gastroesophageal reflux disease)   . 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 Anemia   . Multiple duodenal ulcers    Past Surgical History  Procedure Laterality Date  . Hemorrhoid surgery    . Mole removal    . Hernia repair    . Esophagogastroduodenoscopy  07/03/2012    Procedure: ESOPHAGOGASTRODUODENOSCOPY (EGD);  Surgeon: Winfield Cunas., MD;  Location: [facility] ENDOSCOPY;  Service: Endoscopy;  Laterality: N/A;   Family History  Problem Relation Age of Onset  . Hodgkin's lymphoma Father    Social History  Substance Use Topics  . Smoking status: Former Smoker -- 0.50 packs/day for 50 years    Types: Cigarettes  . Smokeless tobacco: Former Systems developer  . Alcohol Use: 0.0 oz/week    1-2 Glasses of wine per week      Comment: ocassional    Review of Systems  Constitutional: Negative for fever.  Cardiovascular: Negative for chest pain.  Gastrointestinal: Negative for abdominal distention.  Musculoskeletal: Positive for arthralgias.  Neurological: Positive for seizures.  All other systems reviewed and are negative.     Allergies  Gluten meal  Home Medications   Prior to Admission medications   Medication Sig Start Date End Date Taking? Authorizing Provider  amLODipine (NORVASC) 5 MG tablet Take 1 tablet (5 mg total) by mouth daily. 06/09/15   Orlena Sheldon, PA-C  Cholecalciferol (VITAMIN D3) 1000 UNITS CAPS Take 1 capsule (1,000 Units total) by mouth daily. 11/18/13   Lonie Peak Dixon, PA-C  clotrimazole-betamethasone (LOTRISONE) cream APPLY EXTERNALLY TO THE AFFECTED AREA TWICE DAILY 05/27/15   Orlena Sheldon, PA-C  cyclobenzaprine (FLEXERIL) 10 MG tablet Take 10 mg by mouth every 8 (eight) hours as needed. For muscle spasm.    Historical Provider, MD  furosemide (LASIX) 20 MG tablet Take 1 tablet (20 mg total) by mouth daily. 06/09/15   Orlena Sheldon, PA-C  lisinopril-hydrochlorothiazide (PRINZIDE,ZESTORETIC) 20-25 MG per tablet TAKE 1 TABLET BY MOUTH EVERY DAY 05/27/15   Orlena Sheldon, PA-C  pantoprazole (PROTONIX) 40 MG tablet TAKE 1 TABLET BY MOUTH TWICE DAILY BEFORE A MEAL 05/27/15   Orlena Sheldon, PA-C  potassium chloride (KLOR-CON 10) 10  MEQ tablet Take 1 tablet (10 mEq total) by mouth daily. 06/09/15   Lonie Peak Dixon, PA-C  pravastatin (PRAVACHOL) 40 MG tablet TAKE 1 TABLET BY MOUTH EVERY EVENING 12/04/14   Orlena Sheldon, PA-C  tamsulosin (FLOMAX) 0.4 MG CAPS capsule Take 1 capsule (0.4 mg total) by mouth daily. 06/10/15   Orlena Sheldon, PA-C  zoster vaccine live, PF, (ZOSTAVAX) 79480 UNT/0.65ML injection Inject 19,400 Units into the skin once. 06/09/15   Lonie Peak Dixon, PA-C   BP 141/62 mmHg  Pulse 102  Temp(Src) 98.5 F (36.9 C) (Oral)  Resp 16  Ht 5' 9.5" (1.765 m)  Wt 226 lb (102.513 kg)  BMI 32.91 kg/m2   SpO2 96% Physical Exam CONSTITUTIONAL: disheveled HEAD: Normocephalic/atraumatic EYES: EOMI/PERRL ENMT: Mucous membranes moist, no tongue lacerations NECK: supple no meningeal signs SPINE/BACK:entire spine nontender CV: S1/S2 noted, no murmurs/rubs/gallops noted LUNGS: Lungs are clear to auscultation bilaterally, no apparent distress ABDOMEN: soft, nontender, no rebound or guarding, bowel sounds noted throughout abdomen GU:no cva tenderness NEURO: Pt is awake/alert/appropriate, moves all extremitiesx4.  No facial droop.  He has tremor to both hands EXTREMITIES: pulses normal/equal, full ROM, tenderness to palpation of right knee with mild edema Abrasion to right elbow, full ROM noted of elbow SKIN: warm, color normal PSYCH: no abnormalities of mood noted, alert and oriented to situation  ED Course  Procedures  CRITICAL CARE Performed by: Sharyon Cable Total critical care time: 35 Critical care time was exclusive of separately billable procedures and treating other patients. Critical care was necessary to treat or prevent imminent or life-threatening deterioration. Critical care was time spent personally by me on the following activities: development of treatment plan with patient and/or surrogate as well as nursing, discussions with consultants, evaluation of patient's response to treatment, examination of patient, obtaining history from patient or surrogate, ordering and performing treatments and interventions, ordering and review of laboratory studies, ordering and review of radiographic studies, pulse oximetry and re-evaluation of patient's condition. PATIENT WITH NEW ONSET SEIZURE, HE WAS GIVEN ATIVAN, HE IS ALSO HYPONATREMIC (120), HE IS HYPOKALEMIC AND HYPOCHLOREMIC.  HE WILL REQUIRE STEPDOWN ADMISSION  7:03 AM Suspicious for ETOH withrawal induced seizure Workup pending at this time 7:34 AM Pt with significant lab abnormalities including hyponatremia which could contribute  to seizure activity Also suspicious this is related to ETOH withdrawal Will admit D/w medicine admit to SDU Pt updated on plan Labs Review Labs Reviewed  COMPREHENSIVE METABOLIC PANEL - Abnormal; Notable for the following:    Sodium 120 (*)    Potassium 2.9 (*)    Chloride 76 (*)    Glucose, Bld 155 (*)    Creatinine, Ser 1.63 (*)    Calcium 8.6 (*)    AST 97 (*)    Total Bilirubin 1.5 (*)    GFR calc non Af Amer 41 (*)    GFR calc Af Amer 48 (*)    Anion gap 19 (*)    All other components within normal limits  CBC WITH DIFFERENTIAL/PLATELET - Abnormal; Notable for the following:    HCT 38.4 (*)    MCHC 36.7 (*)    Neutrophils Relative % 89 (*)    Lymphocytes Relative 5 (*)    Neutro Abs 8.9 (*)    Lymphs Abs 0.5 (*)    All other components within normal limits  ETHANOL  URINE RAPID DRUG SCREEN, HOSP PERFORMED  BASIC METABOLIC PANEL  SODIUM, URINE, RANDOM  CREATININE, URINE, RANDOM  OSMOLALITY  OSMOLALITY,  URINE  TSH  MAGNESIUM  PHOSPHORUS    Imaging Review Ct Head Wo Contrast  06/29/2015   CLINICAL DATA:  Seizure.  Alcohol abuse.  EXAM: CT HEAD WITHOUT CONTRAST  TECHNIQUE: Contiguous axial images were obtained from the base of the skull through the vertex without intravenous contrast.  COMPARISON:  Head CT and brain MRI 11/19/2005  FINDINGS: No intracranial hemorrhage, mass effect, or midline shift. No hydrocephalus. The basilar cisterns are patent. No evidence of territorial infarct. No intracranial fluid collection. Generalized cerebral and cerebellar atrophy. Mild chronic small vessel ischemic change. Small lacunar infarct in the left basal ganglia, unchanged. Calvarium is intact. Included paranasal sinuses and mastoid air cells are well aerated.  IMPRESSION: Atrophy and chronic small vessel ischemic change. No acute intracranial abnormality.   Electronically Signed   By: Jeb Levering M.D.   On: 06/29/2015 06:56   I have personally reviewed and evaluated these  images and lab results as part of my medical decision-making.   EKG Interpretation   Date/Time:  Tuesday June 29 2015 05:44:25 EDT Ventricular Rate:  103 PR Interval:  143 QRS Duration: 163 QT Interval:  355 QTC Calculation: 465 R Axis:   -33 Text Interpretation:  Ectopic atrial tachycardia, unifocal Multiple  ventricular premature complexes Right bundle branch block artifact noted  which limits evaluation Abnormal ekg Confirmed by Christy Gentles  MD, Elenore Rota  [phone]) on 06/29/2015 5:56:34 AM     Medications  potassium chloride SA (K-DUR,KLOR-CON) CR tablet 40 mEq (not administered)  LORazepam (ATIVAN) injection 2-3 mg (not administered)  folic acid injection 1 mg (not administered)  thiamine (B-1) injection 100 mg (not administered)  LORazepam (ATIVAN) injection 2 mg (2 mg Intravenous Given 06/29/15 0636)    MDM   Final diagnoses:  New onset seizure  Hypokalemia  Hyponatremia  Dehydration    Nursing notes including past medical history and social history reviewed and considered in documentation xrays/imaging reviewed by myself and considered during evaluation Labs/vital reviewed myself and considered during evaluation     Ripley Fraise, MD 06/29/15 [phone]

## 2015-06-29 NOTE — ED Notes (Signed)
Patient transported to X-ray 

## 2015-06-29 NOTE — Progress Notes (Signed)
Mg level 1.3 so 2 gm IV Mg ordered and plan repeat Mg level in am. Phosphorus 3.2.  Erin Hearing, ANP

## 2015-06-29 NOTE — ED Notes (Signed)
Patient arrived via [facility]. EMS reports: male at residence reported that patient had fallen on the floor with seizure like activity. Reports same had occurred approx 1 year prior. Patient is not taking antiseizure medication. EMS witnessed postictal state x 10-15 minutes after arrival. Placed patient on 4 LPM oxygen. 98% SPO2. Patient AOx4 afterwards. Skin tear on right elbow noted. Patient reports he drinks 1/4 of half gallon daily of vodka, and drank yesterday as well. Sinus tach on monitor. RBBB noted. 20 gauge L hand. BP 160/84. CBG - 186. Denies pain. History of MI, HTN, celiac disease.

## 2015-06-29 NOTE — ED Notes (Signed)
Patient transported to CT 

## 2015-06-29 NOTE — ED Notes (Signed)
Inpatient RN unable to take report at this time.  Will call back.

## 2015-06-29 NOTE — Clinical Documentation Improvement (Signed)
Internal Medicine  Can the diagnosis of CHF be further specified?    Acuity - Acute, Chronic, Acute on Chronic   Type - Systolic, Diastolic, Systolic and Diastolic  Other  Clinically Undetermined   Document any associated diagnoses/conditions   Supporting Information: In H&P discussion of use of diuretics related th heart failure.   Please exercise your independent, professional judgment when responding. A specific answer is not anticipated or expected.   Thank You,  [place]

## 2015-06-29 NOTE — ED Notes (Addendum)
Patient unable to recall seizure or fall. Denies pain. Admits that he drinks Vodka daily - approx 1/4 of half gallon each day including yesterday. AOx4. Follows commands. GCS 15. Neuro intact. No acute distress noted.

## 2015-06-29 NOTE — Progress Notes (Signed)
Pt admitted from ED on cardiac monitor and x2 RNs via stretcher. Patient alert and oriented x4, maew with some weakness. Speech clear and denies complaints of pain. See echarting for additional data. Oriented patient to floor, room, seizure precautions and CIWA protocol. Patient verbalized understanding

## 2015-06-30 ENCOUNTER — Telehealth: Payer: Self-pay | Admitting: *Deleted

## 2015-06-30 DIAGNOSIS — R600 Localized edema: Secondary | ICD-10-CM

## 2015-06-30 LAB — CBC
HCT: 38.2 % — ABNORMAL LOW (ref 39.0–52.0)
Hemoglobin: 13.8 g/dL (ref 13.0–17.0)
MCH: 30.3 pg (ref 26.0–34.0)
MCHC: 36.1 g/dL — ABNORMAL HIGH (ref 30.0–36.0)
MCV: 84 fL (ref 78.0–100.0)
Platelets: 256 10*3/uL (ref 150–400)
RBC: 4.55 MIL/uL (ref 4.22–5.81)
RDW: 14 % (ref 11.5–15.5)
WBC: 6.7 10*3/uL (ref 4.0–10.5)

## 2015-06-30 LAB — COMPREHENSIVE METABOLIC PANEL
ALT: 44 U/L (ref 17–63)
AST: 68 U/L — ABNORMAL HIGH (ref 15–41)
Albumin: 3.6 g/dL (ref 3.5–5.0)
Alkaline Phosphatase: 58 U/L (ref 38–126)
Anion gap: 10 (ref 5–15)
BUN: 9 mg/dL (ref 6–20)
CO2: 30 mmol/L (ref 22–32)
Calcium: 8.6 mg/dL — ABNORMAL LOW (ref 8.9–10.3)
Chloride: 84 mmol/L — ABNORMAL LOW (ref 101–111)
Creatinine, Ser: 1.16 mg/dL (ref 0.61–1.24)
GFR calc Af Amer: >60 mL/min (ref >60)
GFR calc non Af Amer: >60 mL/min (ref >60)
Glucose, Bld: 110 mg/dL — ABNORMAL HIGH (ref 65–99)
Potassium: 3.2 mmol/L — ABNORMAL LOW (ref 3.5–5.1)
Sodium: 124 mmol/L — ABNORMAL LOW (ref 135–145)
Total Bilirubin: 1.4 mg/dL — ABNORMAL HIGH (ref 0.3–1.2)
Total Protein: 6.7 g/dL (ref 6.5–8.1)

## 2015-06-30 LAB — HEPATITIS PANEL, ACUTE
HCV Ab: <0.1 s/co ratio (ref 0.0–0.9)
Hep A IgM: NEGATIVE
Hep B C IgM: NEGATIVE
Hepatitis B Surface Ag: NEGATIVE

## 2015-06-30 LAB — PROTIME-INR
INR: 1.01 (ref 0.00–1.49)
Prothrombin Time: 13.5 seconds (ref 11.6–15.2)

## 2015-06-30 LAB — APTT: aPTT: 32 seconds (ref 24–37)

## 2015-06-30 LAB — MAGNESIUM: Magnesium: 1.4 mg/dL — ABNORMAL LOW (ref 1.7–2.4)

## 2015-06-30 MED ORDER — ACETAMINOPHEN 650 MG RE SUPP: 325.0000 mg | Freq: Four times a day (QID) | RECTAL | Status: DC | PRN

## 2015-06-30 MED ORDER — ALBUTEROL SULFATE (2.5 MG/3ML) 0.083% IN NEBU
2.5000 mg | INHALATION_SOLUTION | RESPIRATORY_TRACT | Status: DC | PRN
  Administered 2015-06-30 – 2015-07-13 (×13): 2.5 mg via RESPIRATORY_TRACT
  Filled 2015-06-30 (×14): qty 3

## 2015-06-30 MED ORDER — ALBUTEROL SULFATE (2.5 MG/3ML) 0.083% IN NEBU
INHALATION_SOLUTION | RESPIRATORY_TRACT | Status: AC
  Filled 2015-06-30: qty 3

## 2015-06-30 MED ORDER — ACETAMINOPHEN 500 MG PO TABS
500.0000 mg | ORAL_TABLET | Freq: Four times a day (QID) | ORAL | Status: DC | PRN
  Administered 2015-07-06: 500 mg via ORAL
  Filled 2015-06-30: qty 1

## 2015-06-30 MED ORDER — POTASSIUM CHLORIDE 10 MEQ/100ML IV SOLN
10.0000 meq | INTRAVENOUS | Status: AC
  Administered 2015-06-30 (×4): 10 meq via INTRAVENOUS
  Filled 2015-06-30 (×5): qty 100

## 2015-06-30 MED ORDER — MAGNESIUM SULFATE 2 GM/50ML IV SOLN
2.0000 g | Freq: Once | INTRAVENOUS | Status: AC
  Administered 2015-06-30: 2 g via INTRAVENOUS
  Filled 2015-06-30: qty 50

## 2015-06-30 MED ORDER — CARVEDILOL 3.125 MG PO TABS
3.1250 mg | ORAL_TABLET | Freq: Two times a day (BID) | ORAL | Status: DC
  Administered 2015-07-01 – 2015-07-07 (×14): 3.125 mg via ORAL
  Filled 2015-06-30 (×16): qty 1

## 2015-06-30 NOTE — Progress Notes (Signed)
Pt with increased agitation, constantly trying to get out of bed and pulling off tele leads. Multiple attempts made to redirect by staff with no success. MD notified, order received for restraints. Pt toileted prior to placement and PO fluids offered, explained why restraints are being applied and criteria for removal. No family at bedside at this time, will continue monitoring

## 2015-06-30 NOTE — Evaluation (Signed)
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   Clinical Impression  Pt admitted with/for seizure.  Pt currently limited functionally due to the problems listed below.  (see problems list.)  Pt will benefit from PT to maximize function and safety to be able to get home safely with available assist of family..     Follow Up Recommendations Home health PT;Supervision for mobility/OOB    Equipment Recommendations  Other (comment) (TBA)    Recommendations for Other Services       Precautions / Restrictions Precautions Precautions: Fall      Mobility  Bed Mobility Overal bed mobility: Needs Assistance Bed Mobility: Supine to Sit;Sit to Supine     Supine to sit: Min assist Sit to supine: Min assist   General bed mobility comments: min truncal assist due to pt not following direction yet.  Transfers Overall transfer level: Needs assistance   Transfers: Sit to/from Stand;Stand Pivot Transfers Sit to Stand: Min assist Stand pivot transfers: Min assist          Ambulation/Gait Ambulation/Gait assistance: Min assist Ambulation Distance (Feet): 2 Feet (side stepping up toward [facility]) Assistive device: 1 person hand held assist Gait Pattern/deviations: Step-through pattern     General Gait Details: stiff and guarded  Stairs            Wheelchair Mobility    Modified Rankin (Stroke Patients Only)       Balance Overall  balance assessment: Needs assistance Sitting-balance support: No upper extremity supported;Single extremity supported Sitting balance-Leahy Scale: Fair     Standing balance support: Single extremity supported;Bilateral upper extremity supported Standing balance-Leahy Scale: Poor Standing balance comment: standing EOB with occasional EOB                             Pertinent Vitals/Pain Pain Assessment: No/denies pain    Home Living Family/patient expects to be discharged to:: Private residence Living Arrangements: Non-relatives/Friends   Type of Home: House Home Access: Stairs to enter   Technical brewer of Steps: several Home Layout: One level        Prior Function Level of Independence: Independent               Hand Dominance        Extremity/Trunk Assessment   Upper Extremity Assessment: Defer to OT evaluation           Lower Extremity Assessment: Overall WFL for tasks assessed;Generalized weakness         Communication   Communication: Other (comment) (mumbling)  Cognition Arousal/Alertness: Lethargic Behavior During Therapy: WFL for tasks assessed/performed Overall Cognitive Status: Difficult to assess                      General Comments General comments (skin integrity, edema, etc.): pt restless and mildly agitated on arrival    Exercises        Assessment/Plan    PT Assessment Patient needs continued PT services  PT Diagnosis Difficulty walking  PT Problem List Decreased strength;Decreased activity tolerance;Decreased balance;Decreased mobility;Decreased knowledge of use of DME  PT Treatment Interventions DME instruction;Gait training;Functional mobility training;Therapeutic activities;Balance training;Patient/family education   PT Goals (Current goals can be found in the Care Plan section) Acute Rehab PT Goals Patient Stated Goal: pt unable to participate PT Goal Formulation: Patient unable to  participate in goal setting Time For Goal Achievement: 07/14/15 Potential to Achieve Goals: Good    Frequency Min 3X/week   Barriers to discharge        Co-evaluation               End of Session Equipment Utilized During Treatment: Oxygen (face mask) Activity Tolerance: Patient tolerated treatment well Patient left: in bed;with call bell/phone within reach;with bed alarm set;with restraints reapplied Nurse Communication: Mobility status         Time: 5631-4970 PT Time Calculation (min) (ACUTE ONLY): 25 min   Charges:   PT Evaluation $Initial PT Evaluation Tier I: 1 Procedure PT Treatments $Therapeutic Activity: 8-22 mins   PT G Codes:        Eleen Litz, Tessie Fass 06/30/2015, 2:53 PM

## 2015-06-30 NOTE — Progress Notes (Signed)
[place] TEAM 1 - Stepdown/ICU TEAM Progress Note  Francisco [name] KPT:[id number] DOB: 09/11/1946 DOA: 06/29/2015 PCP: Dena Billet BETH, PA-C  Admit HPI / Brief Narrative: 69 year old male with history of celiac disease, hypertension, alcoholism, recent bilateral lower extremity edema, duodenal ulcer with GIB, chronic microcytic anemia, BPH, GERD, mild hypertrophic obstructive cardiomyopathy/diastolic dysfunction who presented after onset of seizure activity. Patient has no recollection of seizure activity but an individual at the home reported the patient had fallen on the floor secondary to seizure-like activity and reports the patient had a similar episode one year prior and is not typically on antiseizure medications. EMS arrived to the home and patient was postictal for about 10-15 minutes.   In the ER labs revealed a sodium of 120, potassium of 2.9, chloride 76, creatinine 1.63, BUN 10, glucose 155, anion gap 19, AST 97, total bilirubin 1.5. Alcohol level was less than 5.  Outpatient laboratory noted sodium 123 on 8/3 with creatinine 0.88. AST at that time was 121 with an ALT of 60 and a normal total bilirubin. In the ER CT of the head was unremarkable. Plain films of the right knee showed no acute injury.   HPI/Subjective: At the time of visit the patient is obtunded.  He has been quite agitated requiring restraints of his wrist to protect his medical equipment and he is also required frequent scheduled dosing of Ativan for withdrawal symptoms.  His respirations are stable and he appears comfortable.  Assessment/Plan:  Alcohol withdrawal seizure/ Alcohol abuse Continue CIWA protocol  Hyponatremia Likely due to alcohol abuse - workup at this time would not be accurate given his use of multiple diuretic prior to admission - we'll assure the patient is hydrated and follow his sodium trend  Acute renal failure  Appears to be simple prerenal azotemia - continue to hydrate and follow  trend  Mild transaminitis  Elevated in a pattern consistent with alcohol abuse - improving  Hypokalemia  Likely reflective of a significant total body deficit due to alcohol abuse - replace and follow  Hypomagnesemia  Replace and follow trend - likely due to poor nutrition in setting of alcohol abuse  Microcytic anemia  Check anemia panel  B LE edema  This is likely due to diastolic congestive heart failure plus low albumen in an alcoholic - avoid diuretic if at all possible given severe electrolyte derangements detailed above  HOCM - Diastolic CHF   HTN Adjust medical therapy and follow  GERD  BPH  Vitamin D deficiency   HLD  Holding medical therapy until oral intake consistent  Code Status: FULL Family Communication: no family present at time of exam Disposition Plan: Remain in stepdown unit until alcohol withdrawal improved  Consultants: none  Procedures: none  Antibiotics: none  DVT prophylaxis: lovenox   Objective: Blood pressure 126/91, pulse 73, temperature 97.4 F (36.3 C), temperature source Axillary, resp. rate 24, height 5' 9"  (1.753 m), weight 100.3 kg (221 lb 1.9 oz), SpO2 94 %.  Intake/Output Summary (Last 24 hours) at 06/30/15 1002 Last data filed at 06/30/15 0730  Gross per 24 hour  Intake 996.67 ml  Output   1200 ml  Net -203.33 ml   Exam: General: No acute respiratory distress - obtunded Lungs: Clear to auscultation bilaterally without wheezes or crackles Cardiovascular: Regular rate and rhythm without murmur gallop or rub  Abdomen: Nontender, protuberant, soft, bowel sounds positive, no rebound, no ascites, no appreciable mass Extremities: No significant cyanosis, or clubbing, 1+ edema bilateral lower extremities  Data Reviewed: Basic Metabolic Panel:  Recent Labs Lab 06/29/15 0615 06/29/15 0809 06/29/15 2034 06/30/15 0250  NA 120* 120* 126* 124*  K 2.9* 3.0* 3.1* 3.2*  CL 76* 76* 83* 84*  CO2 25 31 30 30   GLUCOSE 155*  106* 129* 110*  BUN 10 9 10 9   CREATININE 1.63* 1.37* 1.32* 1.16  CALCIUM 8.6* 8.7* 8.9 8.6*  MG  --  1.3*  --  1.4*  PHOS  --  3.2  --   --     CBC:  Recent Labs Lab 06/29/15 0615 06/30/15 0250  WBC 10.0 6.7  NEUTROABS 8.9*  --   HGB 14.1 13.8  HCT 38.4* 38.2*  MCV 82.9 84.0  PLT 260 256    Liver Function Tests:  Recent Labs Lab 06/29/15 0615 06/30/15 0250  AST 97* 68*  ALT 55 44  ALKPHOS 61 58  BILITOT 1.5* 1.4*  PROT 6.6 6.7  ALBUMIN 3.5 3.6    Coags:  Recent Labs Lab 06/30/15 0250  INR 1.01    Recent Labs Lab 06/30/15 0250  APTT 32   CBG:  Recent Labs Lab 06/29/15 2129  GLUCAP 146*    Recent Results (from the past 240 hour(s))  MRSA PCR Screening     Status: None   Collection Time: 06/29/15  4:21 PM  Result Value Ref Range Status   MRSA by PCR NEGATIVE NEGATIVE Final    Comment:        The GeneXpert MRSA Assay (FDA approved for NASAL specimens only), is one component of a comprehensive MRSA colonization surveillance program. It is not intended to diagnose MRSA infection nor to guide or monitor treatment for MRSA infections.     Studies:   Recent x-ray studies have been reviewed in detail by the Attending Physician  Scheduled Meds:  Scheduled Meds: . amLODipine  5 mg Oral Daily  . cholecalciferol  1,000 Units Oral Daily  . docusate sodium  100 mg Oral BID  . enoxaparin (LOVENOX) injection  40 mg Subcutaneous Q24H  . folic acid  1 mg Oral Daily  . pantoprazole  40 mg Oral Daily  . sodium chloride  3 mL Intravenous Q12H  . tamsulosin  0.4 mg Oral Daily  . thiamine  100 mg Oral Daily    Time spent on care of this patient: 35 mins   Garret Teale T , MD   [facility] Office  [phone] Pager - Text Page per Shea Evans as per below:  On-Call/Text Page:      [email or web address]      password [email or web address]  If 7PM-7AM, please contact night-coverage [email or web address] Password [email or web address] 06/30/2015, 10:02 AM   LOS: 1 day

## 2015-06-30 NOTE — Telephone Encounter (Signed)
Received fax from [facility] management Department stating this patient has been admitted to acute care hospital  Hospital: [place] Date: 06/29/15  Dx: E86.0-Dehydration  Admitting Physician: Milinda Cave MD  Pending authorization: [id number]  PCP: Olean Ree Dixon,PA  I have routed this to above provider for review.

## 2015-06-30 NOTE — Progress Notes (Addendum)
UR COMPLETED  

## 2015-07-01 ENCOUNTER — Inpatient Hospital Stay (HOSPITAL_COMMUNITY): Payer: Commercial Managed Care - HMO

## 2015-07-01 DIAGNOSIS — F10231 Alcohol dependence with withdrawal delirium: Secondary | ICD-10-CM

## 2015-07-01 DIAGNOSIS — F10239 Alcohol dependence with withdrawal, unspecified: Secondary | ICD-10-CM | POA: Diagnosis present

## 2015-07-01 DIAGNOSIS — I10 Essential (primary) hypertension: Secondary | ICD-10-CM | POA: Diagnosis present

## 2015-07-01 DIAGNOSIS — I422 Other hypertrophic cardiomyopathy: Secondary | ICD-10-CM | POA: Diagnosis present

## 2015-07-01 DIAGNOSIS — R740 Nonspecific elevation of levels of transaminase and lactic acid dehydrogenase [LDH]: Secondary | ICD-10-CM

## 2015-07-01 DIAGNOSIS — F1010 Alcohol abuse, uncomplicated: Secondary | ICD-10-CM | POA: Diagnosis present

## 2015-07-01 DIAGNOSIS — N179 Acute kidney failure, unspecified: Secondary | ICD-10-CM | POA: Diagnosis present

## 2015-07-01 DIAGNOSIS — E876 Hypokalemia: Secondary | ICD-10-CM | POA: Diagnosis present

## 2015-07-01 DIAGNOSIS — D649 Anemia, unspecified: Secondary | ICD-10-CM | POA: Diagnosis present

## 2015-07-01 DIAGNOSIS — E871 Hypo-osmolality and hyponatremia: Secondary | ICD-10-CM

## 2015-07-01 DIAGNOSIS — F10939 Alcohol use, unspecified with withdrawal, unspecified: Secondary | ICD-10-CM | POA: Diagnosis present

## 2015-07-01 DIAGNOSIS — I5033 Acute on chronic diastolic (congestive) heart failure: Secondary | ICD-10-CM | POA: Diagnosis present

## 2015-07-01 LAB — POCT I-STAT 3, ART BLOOD GAS (G3+)
Acid-Base Excess: 4 mmol/L — ABNORMAL HIGH (ref 0.0–2.0)
Bicarbonate: 29.4 mEq/L — ABNORMAL HIGH (ref 20.0–24.0)
O2 Saturation: 98 %
Patient temperature: 99.1
TCO2: 31 mmol/L (ref 0–100)
pCO2 arterial: 47.1 mmHg — ABNORMAL HIGH (ref 35.0–45.0)
pH, Arterial: 7.405 (ref 7.350–7.450)
pO2, Arterial: 101 mmHg — ABNORMAL HIGH (ref 80.0–100.0)

## 2015-07-01 LAB — COMPREHENSIVE METABOLIC PANEL
ALT: 41 U/L (ref 17–63)
AST: 69 U/L — ABNORMAL HIGH (ref 15–41)
Albumin: 3.4 g/dL — ABNORMAL LOW (ref 3.5–5.0)
Alkaline Phosphatase: 51 U/L (ref 38–126)
Anion gap: 9 (ref 5–15)
BUN: 5 mg/dL — ABNORMAL LOW (ref 6–20)
CO2: 30 mmol/L (ref 22–32)
Calcium: 8.5 mg/dL — ABNORMAL LOW (ref 8.9–10.3)
Chloride: 90 mmol/L — ABNORMAL LOW (ref 101–111)
Creatinine, Ser: 0.88 mg/dL (ref 0.61–1.24)
GFR calc Af Amer: >60 mL/min (ref >60)
GFR calc non Af Amer: >60 mL/min (ref >60)
Glucose, Bld: 118 mg/dL — ABNORMAL HIGH (ref 65–99)
Potassium: 3.4 mmol/L — ABNORMAL LOW (ref 3.5–5.1)
Sodium: 129 mmol/L — ABNORMAL LOW (ref 135–145)
Total Bilirubin: 1 mg/dL (ref 0.3–1.2)
Total Protein: 7 g/dL (ref 6.5–8.1)

## 2015-07-01 LAB — PHOSPHORUS: Phosphorus: 2.7 mg/dL (ref 2.5–4.6)

## 2015-07-01 LAB — MAGNESIUM: Magnesium: 1.6 mg/dL — ABNORMAL LOW (ref 1.7–2.4)

## 2015-07-01 LAB — CBC
HCT: 38.7 % — ABNORMAL LOW (ref 39.0–52.0)
Hemoglobin: 13.5 g/dL (ref 13.0–17.0)
MCH: 30.1 pg (ref 26.0–34.0)
MCHC: 34.9 g/dL (ref 30.0–36.0)
MCV: 86.2 fL (ref 78.0–100.0)
Platelets: 200 10*3/uL (ref 150–400)
RBC: 4.49 MIL/uL (ref 4.22–5.81)
RDW: 14.1 % (ref 11.5–15.5)
WBC: 7.8 10*3/uL (ref 4.0–10.5)

## 2015-07-01 LAB — IRON AND TIBC
Iron: 27 ug/dL — ABNORMAL LOW (ref 45–182)
Saturation Ratios: 7 % — ABNORMAL LOW (ref 17.9–39.5)
TIBC: 395 ug/dL (ref 250–450)
UIBC: 368 ug/dL

## 2015-07-01 LAB — FOLATE: Folate: 13.3 ng/mL (ref >5.9)

## 2015-07-01 LAB — RETICULOCYTES
RBC.: 4.44 MIL/uL (ref 4.22–5.81)
Retic Count, Absolute: 62.2 10*3/uL (ref 19.0–186.0)
Retic Ct Pct: 1.4 % (ref 0.4–3.1)

## 2015-07-01 LAB — RAPID HIV SCREEN (HIV 1/2 AB+AG)
HIV 1/2 Antibodies: NONREACTIVE
HIV-1 P24 Antigen - HIV24: NONREACTIVE

## 2015-07-01 LAB — VITAMIN B12: Vitamin B-12: 157 pg/mL — ABNORMAL LOW (ref 180–914)

## 2015-07-01 LAB — FERRITIN: Ferritin: 74 ng/mL (ref 24–336)

## 2015-07-01 MED ORDER — SODIUM CHLORIDE 0.9 % IV SOLN
25.0000 mg | Freq: Once | INTRAVENOUS | Status: AC
  Administered 2015-07-01: 25 mg via INTRAVENOUS
  Filled 2015-07-01: qty 0.5

## 2015-07-01 MED ORDER — IPRATROPIUM-ALBUTEROL 0.5-2.5 (3) MG/3ML IN SOLN
3.0000 mL | Freq: Four times a day (QID) | RESPIRATORY_TRACT | Status: DC
  Administered 2015-07-01 – 2015-07-04 (×12): 3 mL via RESPIRATORY_TRACT
  Filled 2015-07-01 (×12): qty 3

## 2015-07-01 MED ORDER — POTASSIUM CHLORIDE 10 MEQ/100ML IV SOLN
10.0000 meq | INTRAVENOUS | Status: AC
  Administered 2015-07-01 (×3): 10 meq via INTRAVENOUS
  Filled 2015-07-01 (×4): qty 100

## 2015-07-01 MED ORDER — CYANOCOBALAMIN 1000 MCG/ML IJ SOLN
1000.0000 ug | Freq: Once | INTRAMUSCULAR | Status: AC
  Administered 2015-07-01: 1000 ug via INTRAMUSCULAR
  Filled 2015-07-01: qty 1

## 2015-07-01 MED ORDER — DEXTROSE 5 % IV SOLN
3.0000 g | Freq: Once | INTRAVENOUS | Status: AC
  Administered 2015-07-01: 3 g via INTRAVENOUS
  Filled 2015-07-01: qty 6

## 2015-07-01 MED ORDER — SODIUM CHLORIDE 0.9 % IV SOLN
500.0000 mg | Freq: Once | INTRAVENOUS | Status: AC
  Administered 2015-07-02: 500 mg via INTRAVENOUS
  Filled 2015-07-01 (×2): qty 10

## 2015-07-01 MED ORDER — IPRATROPIUM-ALBUTEROL 0.5-2.5 (3) MG/3ML IN SOLN
RESPIRATORY_TRACT | Status: AC
  Filled 2015-07-01: qty 3

## 2015-07-01 MED ORDER — MORPHINE SULFATE (PF) 4 MG/ML IV SOLN
4.0000 mg | Freq: Once | INTRAVENOUS | Status: AC
  Administered 2015-07-01: 4 mg via INTRAVENOUS
  Filled 2015-07-01: qty 1

## 2015-07-01 NOTE — Progress Notes (Signed)
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 Patient has no recollection of seizure activity but an individual at the home reported the patient had fallen on the floor secondary to seizure-like activity and reports the patient had a similar episode one year prior and is not typically on antiseizure medications. EMS arrived to the home and patient was postictal for about 10-15 minutes.   In the ER labs revealed a sodium of 120, potassium of 2.9, chloride 76, creatinine 1.63, BUN 10, glucose 155, anion gap 19, AST 97, total bilirubin 1.5. Alcohol level was less than 5. Outpatient laboratory noted sodium 123 on 8/3 with creatinine 0.88. AST at that time was 121 with an ALT of 60 and a normal total bilirubin. In the ER CT of the head was unremarkable. Plain films of the right knee showed no acute injury.   HPI/Subjective: 8/25 A/O 1 (does not know where, when, why), will open eyes to sternal rub, will not follow commands.  Assessment/Plan: Alcohol withdrawal seizure/ Alcohol abuse -Continue CIWA protocol  Hyponatremia -Likely due to alcohol abuse - workup at this time would not be accurate given his use of multiple diuretic prior to admission -Continue normal saline 75 ml/hr; sodium trending up   Acute renal failure  -Has resolved with hydration   Mild transaminitis  -Stable   Hypokalemia  -Potassium level goal > 4  -Potassium 10 mEq 4 runs   Hypomagnesemia  -Magnesium goal> 2 - Magnesium IV 3 gm   Mixed anemia  -Anemia panel most consistent with mixed anemia iron deficiency  + B 12 anemia -Iron IV 1; will start on PO iron + vitamin C when patient able to take oral medication -Vitamin B 12 injection 1000 g; start patient on oral B-12 when patient able to take oral medication  B LE edema  -Resolving   HOCM - Diastolic CHF  -Echocardiogram pending -Continue Coreg 3.125 mg BID  HTN -See diastolic CHF   GERD  BPH  Vitamin D deficiency   HLD  Holding medical therapy until oral intake consistent   Code Status: FULL Family Communication: no family present at time of exam Disposition Plan: Resolution of alcohol withdrawal    Consultants:   Procedure/Significant Events: Echocardiogram pending   Culture   Antibiotics:   DVT prophylaxis: Lovenox   Devices   LINES / TUBES:      Continuous Infusions: . 0.9 % NaCl with KCl 20 mEq / L 75 mL/hr at 06/30/15 1952    Objective: VITAL SIGNS: Temp: 98.5 F (36.9 C) (08/25 1604) Temp Source: Axillary (08/25 1604) BP: 156/74 mmHg (08/25 1604) Pulse Rate: 81 (08/25 1604) SPO2; FIO2:   Intake/Output Summary (Last 24 hours) at 07/01/15 1822 Last data filed at 07/01/15 1600  Gross per 24 hour  Intake   2350 ml  Output   1625 ml  Net    725 ml     Exam: General:A/O 1 (does not know where, when, why), will open eyes to sternal rub, will not follow commands. No acute respiratory distress Eyes: Negative scleral hemorrhage ENT: Negative Runny nose,negative gingival bleeding, Neck:  Negative scars, masses, torticollis, lymphadenopathy, JVD Lungs: diffuse expiratory wheezing, with bronchial ronchus  sounds, negative crackles Cardiovascular: Regular rate and rhythm without murmur gallop or rub normal S1 and S2 Abdomen:negative abdominal pain, negative dysphagia, nondistended, positive soft, bowel sounds, no rebound, no ascites, no appreciable mass Extremities: No significant cyanosis, clubbing, or edema bilateral lower extremities Psychiatric:  Unable to assess Neurologic:  Unable  to assess     Data Reviewed: Basic Metabolic Panel:  Recent Labs Lab 06/29/15 0615 06/29/15 0809 06/29/15 2034 06/30/15 0250 07/01/15 0410  NA 120* 120* 126* 124* 129*  K 2.9* 3.0* 3.1* 3.2* 3.4*  CL 76* 76* 83* 84* 90*  CO2 25 31 30 30 30   GLUCOSE 155* 106* 129* 110* 118*  BUN 10 9 10 9  5*  CREATININE 1.63* 1.37* 1.32* 1.16 0.88  CALCIUM 8.6* 8.7* 8.9 8.6* 8.5*  MG  --  1.3*  --  1.4* 1.6*  PHOS  --  3.2  --   --  2.7   Liver Function Tests:  Recent Labs Lab 06/29/15 0615 06/30/15 0250 07/01/15 0410  AST 97* 68* 69*  ALT 55 44 41  ALKPHOS 61 58 51  BILITOT 1.5* 1.4* 1.0  PROT 6.6 6.7 7.0  ALBUMIN 3.5 3.6 3.4*   No results for input(s): LIPASE, AMYLASE in the last 168 hours. No results for input(s): AMMONIA in the last 168 hours. CBC:  Recent Labs Lab 06/29/15 0615 06/30/15 0250 07/01/15 0410  WBC 10.0 6.7 7.8  NEUTROABS 8.9*  --   --   HGB 14.1 13.8 13.5  HCT 38.4* 38.2* 38.7*  MCV 82.9 84.0 86.2  PLT 260 256 200   Cardiac Enzymes: No results for input(s): CKTOTAL, CKMB, CKMBINDEX, TROPONINI in the last 168 hours. BNP (last 3 results) No results for input(s): BNP in the last 8760 hours.  ProBNP (last 3 results) No results for input(s): PROBNP in the last 8760 hours.  CBG:  Recent Labs Lab 06/29/15 2129  GLUCAP 146*    Recent Results (from the past 240 hour(s))  MRSA PCR Screening     Status: None   Collection Time: 06/29/15  4:21 PM  Result Value Ref Range Status   MRSA by PCR NEGATIVE NEGATIVE Final    Comment:        The GeneXpert MRSA Assay (FDA approved for NASAL specimens only), is one component of a comprehensive MRSA colonization surveillance program. It is not intended to diagnose MRSA infection nor to guide or monitor treatment for MRSA infections.      Studies:  Recent x-ray studies have been reviewed in detail by the Attending Physician  Scheduled Meds:  Scheduled Meds: . carvedilol  3.125 mg Oral BID WC  .  cholecalciferol  1,000 Units Oral Daily  . cyanocobalamin  1,000 mcg Intramuscular Once  . docusate sodium  100 mg Oral BID  . enoxaparin (LOVENOX) injection  40 mg Subcutaneous Q24H  . folic acid  1 mg Oral Daily  . iron dextran (INFED/DEXFERRUM) infusion  25 mg Intravenous Once   Followed by  . iron dextran (INFED/DEXFERRUM) infusion  500 mg Intravenous Once  . pantoprazole  40 mg Oral Daily  . sodium chloride  3 mL Intravenous Q12H  . tamsulosin  0.4 mg Oral Daily  . thiamine  100 mg Oral Daily    Time spent on care of this patient: 40 mins   [place], Geraldo Docker , MD  [facility] Office  [phone] Pager [phone]  On-Call/Text Page:      [email or web address]      password [facility]  If 7PM-7AM, please contact night-coverage [email or web address] Password [email or web address] 07/01/2015, 6:22 PM   LOS: 2 days   Care during the described time interval was provided by me .  I have reviewed this patient's available data, including medical history, events of note, physical examination, and all test results as part of my evaluation. I have personally reviewed and interpreted all radiology studies.   Dia Crawford, MD [phone] Pager

## 2015-07-01 NOTE — Progress Notes (Signed)
Patient was NT suction. Thick moderate, yellow, green mucous. Patient tolerated will. Sat improved to 97%. RT will continue to monitor as needed.

## 2015-07-01 NOTE — Progress Notes (Signed)
  Echocardiogram 2D Echocardiogram has been performed.  Francisco [name], Francisco [name] 07/01/2015, 3:43 PM

## 2015-07-02 DIAGNOSIS — E785 Hyperlipidemia, unspecified: Secondary | ICD-10-CM | POA: Diagnosis present

## 2015-07-02 DIAGNOSIS — I5032 Chronic diastolic (congestive) heart failure: Secondary | ICD-10-CM | POA: Diagnosis present

## 2015-07-02 LAB — BASIC METABOLIC PANEL
Anion gap: 9 (ref 5–15)
BUN: 11 mg/dL (ref 6–20)
CO2: 26 mmol/L (ref 22–32)
Calcium: 8.8 mg/dL — ABNORMAL LOW (ref 8.9–10.3)
Chloride: 93 mmol/L — ABNORMAL LOW (ref 101–111)
Creatinine, Ser: 0.92 mg/dL (ref 0.61–1.24)
GFR calc Af Amer: >60 mL/min (ref >60)
GFR calc non Af Amer: >60 mL/min (ref >60)
Glucose, Bld: 141 mg/dL — ABNORMAL HIGH (ref 65–99)
Potassium: 3.9 mmol/L (ref 3.5–5.1)
Sodium: 128 mmol/L — ABNORMAL LOW (ref 135–145)

## 2015-07-02 LAB — MAGNESIUM: Magnesium: 1.5 mg/dL — ABNORMAL LOW (ref 1.7–2.4)

## 2015-07-02 LAB — PROTIME-INR
INR: 1.02 (ref 0.00–1.49)
Prothrombin Time: 13.6 seconds (ref 11.6–15.2)

## 2015-07-02 MED ORDER — VITAMIN C 500 MG PO TABS
500.0000 mg | ORAL_TABLET | Freq: Two times a day (BID) | ORAL | Status: DC
  Administered 2015-07-02 – 2015-07-13 (×21): 500 mg via ORAL
  Filled 2015-07-02 (×23): qty 1

## 2015-07-02 MED ORDER — FERROUS SULFATE 325 (65 FE) MG PO TABS
325.0000 mg | ORAL_TABLET | Freq: Two times a day (BID) | ORAL | Status: DC
  Administered 2015-07-03 – 2015-07-13 (×9): 325 mg via ORAL
  Filled 2015-07-02 (×18): qty 1

## 2015-07-02 MED ORDER — MAGNESIUM SULFATE 4 GM/100ML IV SOLN
4.0000 g | Freq: Once | INTRAVENOUS | Status: AC
  Administered 2015-07-02: 4 g via INTRAVENOUS
  Filled 2015-07-02: qty 100

## 2015-07-02 MED ORDER — ATORVASTATIN CALCIUM 40 MG PO TABS
40.0000 mg | ORAL_TABLET | Freq: Every day | ORAL | Status: DC
  Administered 2015-07-02 – 2015-07-12 (×11): 40 mg via ORAL
  Filled 2015-07-02 (×11): qty 1

## 2015-07-02 MED ORDER — VITAMIN B-12 100 MCG PO TABS
250.0000 ug | ORAL_TABLET | Freq: Every day | ORAL | Status: DC
  Administered 2015-07-02 – 2015-07-13 (×12): 250 ug via ORAL
  Filled 2015-07-02 (×4): qty 1
  Filled 2015-07-02 (×2): qty 3
  Filled 2015-07-02 (×4): qty 1
  Filled 2015-07-02: qty 3
  Filled 2015-07-02: qty 1

## 2015-07-02 MED ORDER — ASCORBIC ACID 500 MG/ML IJ SOLN
500.0000 mg | Freq: Two times a day (BID) | INTRAMUSCULAR | Status: DC
  Filled 2015-07-02: qty 1

## 2015-07-02 NOTE — Progress Notes (Signed)
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 Patient has no recollection of seizure activity but an individual at the home reported the patient had fallen on the floor secondary to seizure-like activity and reports the patient had a similar episode one year prior and is not typically on antiseizure medications. EMS arrived to the home and patient was postictal for about 10-15 minutes.   In the ER labs revealed a sodium of 120, potassium of 2.9, chloride 76, creatinine 1.63, BUN 10, glucose 155, anion gap 19, AST 97, total bilirubin 1.5. Alcohol level was less than 5. Outpatient laboratory noted sodium 123 on 8/3 with creatinine 0.88. AST at that time was 121 with an ALT of 60 and a normal total bilirubin. In the ER CT of the head was unremarkable. Plain films of the right knee showed no acute injury.   HPI/Subjective: 8/26 A/O 2 (does not know when, why), sitting comfortably in bed eating his dinner follows all commands   Assessment/Plan: Alcohol withdrawal seizure/ Alcohol abuse -Continue CIWA protocol -Cognition improving however still not at baseline. -PT; recommends home health -OT consult pending -Out of bed to chair for all meals  Hyponatremia -Likely due to alcohol abuse - workup at this time would not be accurate given his use of multiple diuretic prior to admission -Continue normal saline 75 ml/hr; sodium trending up   Acute renal failure  -Has resolved with hydration   Mild transaminitis  -Stable   Hypokalemia  -Potassium level goal > 4   Hypomagnesemia  -Magnesium  goal> 2 - Magnesium IV  4 gm   Mixed anemia  -Anemia panel most consistent with mixed anemia iron deficiency + B 12 anemia -Iron IV 1;  -Start Iron 325 mg  BID+ vitamin C 500 mg BID -Vitamin B 12 injection 1000 g; - start PO B-12 250 g daily   B LE edema  -Resolving   HOCM - Diastolic CHF  -Echocardiogram; results below  -Continue Coreg 3.125 mg BID  HTN -See diastolic CHF   GERD  BPH  Vitamin D deficiency   HLD  -Start Lipitor 40 mg daily    Code Status: FULL Family Communication: no family present at time of exam Disposition Plan: Resolution of alcohol withdrawal; discharge next 48-72 hours    Consultants:   Procedure/Significant Events: 8/25 Echocardiogram; - Left ventricle: Moderate LVH. -LVEF= 55%-60%. -(grade 1 diastolic dysfunction). - Left atrium: moderately to severely dilated.  Culture   Antibiotics:   DVT prophylaxis: Lovenox   Devices   LINES / TUBES:      Continuous Infusions: . 0.9 % NaCl with KCl 20 mEq / L 75 mL/hr at 07/01/15 2307    Objective: VITAL SIGNS: Temp: 97.7 F (36.5 C) (08/26 1947) Temp Source: Oral (08/26 1947) BP: 164/59 mmHg (08/26 1947) Pulse Rate: 73 (08/26 1947) SPO2; FIO2:   Intake/Output Summary (Last 24 hours) at 07/02/15 2029 Last data filed at 07/02/15 1947  Gross per 24 hour  Intake   1230 ml  Output   1725 ml  Net   -495 ml     Exam: General:A/O 2 (does not know when, why), sitting comfortably in bed eating his dinner follows all  commands No acute respiratory distress Eyes: Negative scleral hemorrhage ENT: Negative Runny nose,negative gingival bleeding, Neck:  Negative scars, masses, torticollis, lymphadenopathy, JVD Lungs: clear to auscultation bilateral  Cardiovascular: Regular rate and rhythm without murmur gallop or rub normal S1 and S2 Abdomen:negative abdominal pain, negative dysphagia, nondistended, positive soft, bowel sounds, no rebound, no ascites, no appreciable  mass Extremities: No significant cyanosis, clubbing, or edema bilateral lower extremities Psychiatric: Cognition significantly cleared Neurologic:  Cranial nerves II through XII intact, tongue/uvula midline, all extremity strength 5/5, sensation intact, did not ambulate patient    Data Reviewed: Basic Metabolic Panel:  Recent Labs Lab 06/29/15 0809 06/29/15 2034 06/30/15 0250 07/01/15 0410 07/02/15 1911  NA 120* 126* 124* 129* 128*  K 3.0* 3.1* 3.2* 3.4* 3.9  CL 76* 83* 84* 90* 93*  CO2 31 30 30 30 26   GLUCOSE 106* 129* 110* 118* 141*  BUN 9 10 9  5* 11  CREATININE 1.37* 1.32* 1.16 0.88 0.92  CALCIUM 8.7* 8.9 8.6* 8.5* 8.8*  MG 1.3*  --  1.4* 1.6* 1.5*  PHOS 3.2  --   --  2.7  --    Liver Function Tests:  Recent Labs Lab 06/29/15 0615 06/30/15 0250 07/01/15 0410  AST 97* 68* 69*  ALT 55 44 41  ALKPHOS 61 58 51  BILITOT 1.5* 1.4* 1.0  PROT 6.6 6.7 7.0  ALBUMIN 3.5 3.6 3.4*   No results for input(s): LIPASE, AMYLASE in the last 168 hours. No results for input(s): AMMONIA in the last 168 hours. CBC:  Recent Labs Lab 06/29/15 0615 06/30/15 0250 07/01/15 0410  WBC 10.0 6.7 7.8  NEUTROABS 8.9*  --   --   HGB 14.1 13.8 13.5  HCT 38.4* 38.2* 38.7*  MCV 82.9 84.0 86.2  PLT 260 256 200   Cardiac Enzymes: No results for input(s): CKTOTAL, CKMB, CKMBINDEX, TROPONINI in the last 168 hours. BNP (last 3 results) No results for input(s): BNP in the last 8760 hours.  ProBNP (last 3 results) No results for input(s): PROBNP in the last 8760 hours.  CBG:  Recent Labs Lab 06/29/15 2129  GLUCAP 146*    Recent Results (from the past 240 hour(s))  MRSA PCR Screening     Status: None   Collection Time: 06/29/15  4:21 PM  Result Value Ref Range Status   MRSA by PCR NEGATIVE NEGATIVE Final    Comment:        The GeneXpert MRSA Assay (FDA approved for NASAL specimens only), is one component of a comprehensive MRSA colonization surveillance program. It is  not intended to diagnose MRSA infection nor to guide or monitor treatment for MRSA infections.      Studies:  Recent x-ray studies have been reviewed in detail by the Attending Physician  Scheduled Meds:  Scheduled Meds: . ascorbic acid  500 mg Intravenous BID  . atorvastatin  40 mg Oral q1800  . carvedilol  3.125 mg Oral BID WC  . cholecalciferol  1,000 Units Oral Daily  . docusate sodium  100 mg Oral BID  . enoxaparin (LOVENOX) injection  40 mg Subcutaneous Q24H  . [START ON 07/03/2015] ferrous sulfate  325 mg Oral BID WC  . folic acid  1 mg Oral Daily  . ipratropium-albuterol  3 mL Nebulization Q6H  . magnesium sulfate 1 - 4 g bolus IVPB  4 g Intravenous Once  . pantoprazole  40 mg Oral Daily  . sodium chloride  3 mL Intravenous Q12H  . tamsulosin  0.4  mg Oral Daily  . thiamine  100 mg Oral Daily  . vitamin B-12  250 mcg Oral Daily    Time spent on care of this patient: 40 mins   Shirlee Whitmire, Geraldo Docker , MD  [facility] Office  [phone] Pager [phone]  On-Call/Text Page:      [email or web address]      password [email or web address]  If 7PM-7AM, please contact night-coverage [email or web address] Password [email or web address] 07/02/2015, 8:29 PM   LOS: 3 days   Care during the described time interval was provided by me .  I have reviewed this patient's available data, including medical history, events of note, physical examination, and all test results as part of my evaluation. I have personally reviewed and interpreted all radiology studies.   Dia Crawford, MD [phone] Pager

## 2015-07-02 NOTE — Care Management Important Message (Signed)
Important Message  Patient Details  Name: Francisco [name] MRN: [id number] Date of Birth: [birth year]   Medicare Important Message Given:  Yes-second notification given    Sharin Mons, RN 07/02/2015, 1:54 PM

## 2015-07-02 NOTE — Progress Notes (Addendum)
Physical Therapy Treatment Patient Details Name: Francisco [name] MRN: [id number] DOB: 1946/01/03 Today's Date: 07/02/2015    History of Present Illness This is a 69 year old male with history of celiac disease, hypertension, chronic ongoing alcoholism, recent issues with bilateral lower extremity edema, prior duodenal ulcer with GI bleeding and symptomatic anemia, chronic microcytic anemia, mild transaminitis as an outpatient in the past 2 weeks, BPH, GERD, mild hypertrophic obstructive cardiomyopathy with associated mild diastolic dysfunction who presents after onset of seizure activity.     PT Comments    Progressing slowly.  Mobility and gait guarded and stiff.  Assist needed to help with stability and w/shift.  Mentation is improving.   Follow Up Recommendations  Home health PT;Supervision for mobility/OOB     Equipment Recommendations   (TBA)    Recommendations for Other Services       Precautions / Restrictions Precautions Precautions: Fall    Mobility  Bed Mobility Overal bed mobility: Needs Assistance Bed Mobility: Supine to Sit     Supine to sit: Mod assist     General bed mobility comments: truncal assist, pt used UE's to assist  Transfers Overall transfer level: Needs assistance Equipment used: Ambulation equipment used Transfers: Sit to/from [facility] Sit to Stand: Min assist;Mod assist Stand pivot transfers: Min assist       General transfer comment: assist to come forward more than powering up.  Ambulation/Gait Ambulation/Gait assistance: Mod assist Ambulation Distance (Feet): 6 Feet Assistive device: 1 person hand held assist (portable O2 caddy.) Gait Pattern/deviations: Step-to pattern;Decreased step length - right;Decreased step length - left     General Gait Details: pt had trouble w/b on L LE to move R and then w/shifting enough to move L LE.   Stairs            Wheelchair Mobility    Modified Rankin (Stroke  Patients Only)       Balance Overall balance assessment: Needs assistance Sitting-balance support: No upper extremity supported Sitting balance-Leahy Scale: Fair       Standing balance-Leahy Scale: Poor                      Cognition Arousal/Alertness: Awake/alert;Lethargic Behavior During Therapy: WFL for tasks assessed/performed Overall Cognitive Status: Within Functional Limits for tasks assessed (still some impairment, but functional for therapy.)                      Exercises      General Comments General comments (skin integrity, edema, etc.): SpO2 maintained at 96% on 4L [place]  EHR 97 bpm      Pertinent Vitals/Pain Pain Assessment: No/denies pain    Home Living                      Prior Function            PT Goals (current goals can now be found in the care plan section) Acute Rehab PT Goals Patient Stated Goal: pt unable to participate Time For Goal Achievement: 07/14/15 Potential to Achieve Goals: Good Progress towards PT goals: Progressing toward goals    Frequency  Min 3X/week    PT Plan Current plan remains appropriate    Co-evaluation             End of Session Equipment Utilized During Treatment: Oxygen Activity Tolerance: Patient tolerated treatment well Patient left: with call bell/phone within reach;in chair     Time: 6789-3810  PT Time Calculation (min) (ACUTE ONLY): 24 min  Charges:  $Gait Training: 8-22 mins $Therapeutic Activity: 8-22 mins                    G Codes:      Carlette Palmatier, Tessie Fass 07/02/2015, 4:55 PM 07/02/2015  Donnella Sham, PT [phone]  (pager)

## 2015-07-02 NOTE — Care Management Note (Addendum)
Case Management Note  Patient Details  Name: Francisco [name] MRN: [id number] Date of Birth: 12/25/1945  Subjective/Objective:           Admitted with ETOH withdrawal seizures,  history of celiac disease, hypertension, chronic ongoing alcoholism, recent issues with bilateral lower extremity edema, prior duodenal ulcer with GI bleeding anemia. PTA adl's independent. Pt states lives with rommate.   Action/Plan: Return to home when medically stable. CM  to f/u with disposition needs.  Expected Discharge Date:        unknown          Expected Discharge Plan:  [place]  In-House Referral:     Discharge planning Services  CM Consult  Post Acute Care Choice:    Choice offered to:     DME Arranged:    DME Agency:     [facility]   [phone]) [facility] Agency:   [facility]  Status of Service:  In process, will continue to follow  Medicare Important Message Given:    Date Medicare IM Given:    Medicare IM give by:    Date Additional Medicare IM Given:    Additional Medicare Important Message give by:     If discussed at [place] of Stay Meetings, dates discussed:    Additional Comments: Lavera Guise (Sister)  [phone]  Whitman Hero [place], [place] [phone] 07/02/2015, 11:29 AM

## 2015-07-03 ENCOUNTER — Inpatient Hospital Stay (HOSPITAL_COMMUNITY): Payer: Commercial Managed Care - HMO

## 2015-07-03 LAB — BASIC METABOLIC PANEL
Anion gap: 7 (ref 5–15)
BUN: 10 mg/dL (ref 6–20)
CO2: 27 mmol/L (ref 22–32)
Calcium: 8.8 mg/dL — ABNORMAL LOW (ref 8.9–10.3)
Chloride: 93 mmol/L — ABNORMAL LOW (ref 101–111)
Creatinine, Ser: 0.82 mg/dL (ref 0.61–1.24)
GFR calc Af Amer: >60 mL/min (ref >60)
GFR calc non Af Amer: >60 mL/min (ref >60)
Glucose, Bld: 121 mg/dL — ABNORMAL HIGH (ref 65–99)
Potassium: 3.6 mmol/L (ref 3.5–5.1)
Sodium: 127 mmol/L — ABNORMAL LOW (ref 135–145)

## 2015-07-03 LAB — MAGNESIUM: Magnesium: 2.3 mg/dL (ref 1.7–2.4)

## 2015-07-03 NOTE — Progress Notes (Signed)
Patient ID: Francisco [name], male   DOB: 09/30/1946, 69 y.o.   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 Pt has no recollection of seizure activity but per EMS report, pt appeared post ictal upon their arrival.   In the ER labs revealed Na 120, K 2.9, Cr 1.63, AST 97, total bilirubin 1.5. Outpatient laboratory noted Na 123 on 8/3 with Cr 0.88. AST at that time was 121 with an ALT of 60 and a normal total bilirubin. In the ER CT of the head was unremarkable. Plain films of the right knee showed no acute injury.   Assessment/Plan:    Alcohol withdrawal seizure/ Alcohol abuse - no seizures while inpatient  - keep on CIWA protocol  - mental status better this AM but per family still not at [place]  - PT evaluation done and recommends HH PT Hyponatremia - secondary to alcohol abuse  - workup at this time would not be accurate given his use of multiple diuretics prior to admission - stop IVF as pt tolerating diet well - Na is more stable, will repeat BMP in AM Acute renal failure  - pre renal in etiology - now resolved  Mild transaminitis  - alcohol induced hepatitis, LFT's elevated in the pattern of alcohol related damage - trending down - CMET in AM Hypokalemia  - supplemented and WNL  - BMP in AM Hypomagnesemia  - supplemented and WNL Anemia of chronic disease, IDA - Started Iron 325 mg BID+ vitamin C 500 mg BID - Vitamin B 12 injection 1000 g; - started PO B-12 250 g daily  B LE edema secondary to  - Resolving  HOCM - Diastolic CHF, chronic, grade I  -  Echocardiogram; results below  - Continue Coreg 3.125 mg BID HTN - reasonable inpatient control  GERD - continue PPI HLD  - Started Lipitor 40 mg daily   DVT prophylaxis - Lovenox SQ  Code Status: Full.  Family Communication:  plan of care discussed with the patient Disposition Plan: Home when stable.   IV access:  Peripheral IV  Procedures and diagnostic studies:    Dg Chest 2 View  07/03/2015  Mild diffuse peribronchial cuffing, suggestive of acute bronchitis.   Ct Head Wo Contrast  06/29/2015   Atrophy and chronic small vessel ischemic change. No acute intracranial abnormality.     Dg Chest Port 1 View 07/01/2015   Hypoinflation of the lungs. No acute cardiopulmonary abnormality seen.    Dg Knee Complete 4 Views Right 06/29/2015   No acute bony abnormality of the right knee is observed. If there are clinical concerns of significant soft tissue injury-internal derangement, MRI would be the most useful imaging step.   Medical Consultants:  None  Other Consultants:  PT  IAnti-Infectives:   None   Faye Ramsay, MD  [facility] Pager [phone]  If 7PM-7AM, please contact night-coverage [email or web address] Password [facility] 07/03/2015, 5:19 PM   LOS: 4 days   HPI/Subjective: No events overnight. Wants to go home.  Objective: Filed Vitals:   07/03/15 1157 07/03/15 1200 07/03/15 1439 07/03/15 1630  BP: 165/81     Pulse: 75 73    Temp: 98 F (36.7 C)  98 F (36.7 C)  TempSrc: Oral   Oral  Resp: 17 23    Height:      Weight:      SpO2: 92% 94% 91%     Intake/Output Summary (Last 24 hours) at 07/03/15 1719 Last data filed at 07/03/15 1200  Gross per 24 hour  Intake      0 ml  Output   1600 ml  Net  -1600 ml    Exam:   General:  Pt is alert, follows commands appropriately, not in acute distress  Cardiovascular: Regular rate and rhythm, no rubs, no gallops  Respiratory: Clear to auscultation bilaterally, no wheezing, diminished breath sounds at bases    Abdomen: Soft, non tender, non distended, bowel sounds present, no guarding  Extremities: +1 bilateral LE edema with chronic venous stasis changes   Data Reviewed: Basic Metabolic Panel:  Recent Labs Lab 06/29/15 0809 06/29/15 2034 06/30/15 0250 07/01/15 0410 07/02/15 1911 07/03/15 0300  NA 120* 126* 124* 129* 128* 127*  K 3.0* 3.1* 3.2* 3.4* 3.9 3.6  CL 76* 83* 84* 90* 93* 93*  CO2 31 30 30 30 26 27   GLUCOSE 106* 129* 110* 118* 141* 121*  BUN 9 10 9  5* 11 10  CREATININE 1.37* 1.32* 1.16 0.88 0.92 0.82  CALCIUM 8.7* 8.9 8.6* 8.5* 8.8* 8.8*  MG 1.3*  --  1.4* 1.6* 1.5* 2.3  PHOS 3.2  --   --  2.7  --   --    Liver Function Tests:  Recent Labs Lab 06/29/15 0615 06/30/15 0250 07/01/15 0410  AST 97* 68* 69*  ALT 55 44 41  ALKPHOS 61 58 51  BILITOT 1.5* 1.4* 1.0  PROT 6.6 6.7 7.0  ALBUMIN 3.5 3.6 3.4*   No results for input(s): LIPASE, AMYLASE in the last 168 hours. No results for input(s): AMMONIA in the last 168 hours. CBC:  Recent Labs Lab 06/29/15 0615 06/30/15 0250 07/01/15 0410  WBC 10.0 6.7 7.8  NEUTROABS 8.9*  --   --   HGB 14.1 13.8 13.5  HCT 38.4* 38.2* 38.7*  MCV 82.9 84.0 86.2  PLT 260 256 200   Cardiac Enzymes: No results for input(s): CKTOTAL, CKMB, CKMBINDEX, TROPONINI in the last 168 hours. BNP: Invalid input(s): POCBNP CBG:  Recent Labs Lab 06/29/15 2129  GLUCAP 146*    Recent Results (from the past 240 hour(s))  MRSA PCR Screening     Status: None   Collection Time: 06/29/15  4:21 PM  Result Value Ref Range Status   MRSA by PCR NEGATIVE NEGATIVE Final    Comment:        The GeneXpert MRSA Assay (FDA approved for NASAL specimens only), is one component of a comprehensive MRSA colonization surveillance program. It is not intended to diagnose MRSA infection nor to guide or monitor treatment for MRSA infections.      Scheduled Meds: . atorvastatin  40 mg Oral q1800  . carvedilol  3.125 mg Oral BID WC  .  cholecalciferol  1,000 Units Oral Daily  . docusate sodium  100 mg Oral BID  . enoxaparin (LOVENOX) injection  40 mg Subcutaneous Q24H  . ferrous sulfate  325 mg Oral BID WC  . folic acid  1 mg Oral Daily  . ipratropium-albuterol  3 mL Nebulization Q6H  . pantoprazole  40 mg Oral Daily  . sodium chloride  3 mL Intravenous Q12H  . tamsulosin  0.4 mg Oral Daily  . thiamine  100 mg Oral Daily  .  vitamin B-12  250 mcg Oral Daily  . vitamin C  500 mg Oral BID   Continuous Infusions: . 0.9 % NaCl with KCl 20 mEq / L 75 mL/hr at 07/01/15 2307

## 2015-07-03 NOTE — Progress Notes (Signed)
UR COMPLETED  

## 2015-07-04 ENCOUNTER — Inpatient Hospital Stay (HOSPITAL_COMMUNITY): Payer: Commercial Managed Care - HMO

## 2015-07-04 LAB — COMPREHENSIVE METABOLIC PANEL
ALT: 26 U/L (ref 17–63)
AST: 39 U/L (ref 15–41)
Albumin: 3 g/dL — ABNORMAL LOW (ref 3.5–5.0)
Alkaline Phosphatase: 56 U/L (ref 38–126)
Anion gap: 9 (ref 5–15)
BUN: 8 mg/dL (ref 6–20)
CO2: 27 mmol/L (ref 22–32)
Calcium: 8.9 mg/dL (ref 8.9–10.3)
Chloride: 93 mmol/L — ABNORMAL LOW (ref 101–111)
Creatinine, Ser: 0.89 mg/dL (ref 0.61–1.24)
GFR calc Af Amer: >60 mL/min (ref >60)
GFR calc non Af Amer: >60 mL/min (ref >60)
Glucose, Bld: 111 mg/dL — ABNORMAL HIGH (ref 65–99)
Potassium: 3.4 mmol/L — ABNORMAL LOW (ref 3.5–5.1)
Sodium: 129 mmol/L — ABNORMAL LOW (ref 135–145)
Total Bilirubin: 1.2 mg/dL (ref 0.3–1.2)
Total Protein: 6.7 g/dL (ref 6.5–8.1)

## 2015-07-04 LAB — CBC
HCT: 39.8 % (ref 39.0–52.0)
Hemoglobin: 13.6 g/dL (ref 13.0–17.0)
MCH: 29.9 pg (ref 26.0–34.0)
MCHC: 34.2 g/dL (ref 30.0–36.0)
MCV: 87.5 fL (ref 78.0–100.0)
Platelets: 258 10*3/uL (ref 150–400)
RBC: 4.55 MIL/uL (ref 4.22–5.81)
RDW: 14.4 % (ref 11.5–15.5)
WBC: 7 10*3/uL (ref 4.0–10.5)

## 2015-07-04 LAB — MAGNESIUM: Magnesium: 1.5 mg/dL — ABNORMAL LOW (ref 1.7–2.4)

## 2015-07-04 MED ORDER — DOXYCYCLINE HYCLATE 100 MG PO TABS
100.0000 mg | ORAL_TABLET | Freq: Two times a day (BID) | ORAL | Status: DC
  Administered 2015-07-04 – 2015-07-06 (×4): 100 mg via ORAL
  Filled 2015-07-04 (×5): qty 1

## 2015-07-04 MED ORDER — POTASSIUM CHLORIDE CRYS ER 20 MEQ PO TBCR
40.0000 meq | EXTENDED_RELEASE_TABLET | Freq: Once | ORAL | Status: AC
  Administered 2015-07-04: 40 meq via ORAL
  Filled 2015-07-04: qty 2

## 2015-07-04 MED ORDER — MAGNESIUM SULFATE 2 GM/50ML IV SOLN
2.0000 g | Freq: Once | INTRAVENOUS | Status: AC
  Administered 2015-07-04: 2 g via INTRAVENOUS
  Filled 2015-07-04: qty 50

## 2015-07-04 NOTE — Progress Notes (Signed)
Patient ID: Francisco [name], male   DOB: 27-Dec-1945, 69 y.o.   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 Pt has no recollection of seizure activity but per EMS report, pt appeared post ictal upon their arrival.   In the ER labs revealed Na 120, K 2.9, Cr 1.63, AST 97, total bilirubin 1.5. Outpatient laboratory noted Na 123 on 8/3 with Cr 0.88. AST at that time was 121 with an ALT of 60 and a normal total bilirubin. In the ER CT of the head was unremarkable. Plain films of the right knee showed no acute injury.   Assessment/Plan:    Alcohol withdrawal seizure/ Alcohol abuse - no seizures while inpatient  - keep on CIWA protocol  - mental status better this AM, pt asking to go home   - PT evaluation done and recommends HH PT, orders placed  Right knee swelling and warmth to touch - with some fluid noted  - XRAY requested and will place on empiric doxycycline for now - may need ortho consult for aspiration  Hyponatremia - secondary to alcohol abuse  - workup at this time would not be accurate given his use of multiple diuretics prior to admission - Na is more stable at 129  Acute renal failure  - pre renal in etiology - now resolved  Mild transaminitis  - alcohol induced hepatitis, LFT's elevated in the pattern of alcohol related damage - resolved  Hypokalemia  - still low, will supplement and repeat BMP in AM Hypomagnesemia  - still low, will give IV mg 2 gm and repeat Mg in AM Anemia of chronic disease, IDA - Started Iron 325 mg BID+ vitamin C 500 mg BID - Vitamin B  12 injection 1000 g; - started PO B-12 250 g daily  B LE edema secondary to  - Resolving  HOCM - Diastolic CHF, chronic, grade I  - Echocardiogram; results below  - Continue Coreg 3.125 mg BID HTN - reasonable inpatient control  GERD - continue PPI HLD  - Started Lipitor 40 mg daily   DVT prophylaxis - Lovenox SQ  Code Status: Full.  Family Communication:  plan of care discussed with the patient Disposition Plan: Home in 24 hours   IV access:  Peripheral IV  Procedures and diagnostic studies:    Dg Chest 2 View  07/03/2015  Mild diffuse peribronchial cuffing, suggestive of acute bronchitis.   Ct Head Wo Contrast  06/29/2015   Atrophy and chronic small vessel ischemic change. No acute intracranial abnormality.     Dg Chest Port 1 View 07/01/2015   Hypoinflation of the lungs. No acute cardiopulmonary abnormality seen.    Dg Knee Complete 4 Views Right 06/29/2015   No acute bony abnormality of the right knee is observed. If there are clinical concerns of significant soft tissue injury-internal derangement, MRI would be the most useful imaging step.   Medical Consultants:  None  Other Consultants:  PT  IAnti-Infectives:   Doxycycline 8/28 -->  Faye Ramsay, MD  [facility] Pager [phone]  If 7PM-7AM, please contact night-coverage [email or web address] Password [facility] 07/04/2015, 10:32 AM   LOS: 5 days   HPI/Subjective: No events overnight. Right knee pain.  Objective: Filed Vitals:   07/04/15 0430 07/04/15 0500 07/04/15 0800 07/04/15 0914  BP: 142/63     Pulse: 88     Temp: 99 F (37.2 C)  98.7 F (37.1 C)   TempSrc: Oral  Oral   Resp: 14     Height:      Weight:  98 kg (216 lb 0.8 oz)    SpO2: 96%   96%    Intake/Output Summary (Last 24 hours) at 07/04/15 1032 Last data filed at 07/04/15 0800  Gross per 24 hour  Intake    360 ml  Output    525 ml  Net   -165 ml    Exam:   General:  Pt is alert, follows commands appropriately, not in acute  distress  Cardiovascular: Regular rate and rhythm, no rubs, no gallops  Respiratory: Clear to auscultation bilaterally, no wheezing, diminished breath sounds at bases   Abdomen: Soft, non tender, non distended, bowel sounds present, no guarding  Extremities: +1 bilateral LE edema with chronic venous stasis changes, right knee TTP and warm, swelling   Data Reviewed: Basic Metabolic Panel:  Recent Labs Lab 06/29/15 0809  06/30/15 0250 07/01/15 0410 07/02/15 1911 07/03/15 0300 07/04/15 0253  NA 120*  < > 124* 129* 128* 127* 129*  K 3.0*  < > 3.2* 3.4* 3.9 3.6 3.4*  CL 76*  < > 84* 90* 93* 93* 93*  CO2 31  < > 30 30 26 27 27   GLUCOSE 106*  < > 110* 118* 141* 121* 111*  BUN 9  < > 9 5* 11 10 8   CREATININE 1.37*  < > 1.16 0.88 0.92 0.82 0.89  CALCIUM 8.7*  < > 8.6* 8.5* 8.8* 8.8* 8.9  MG 1.3*  --  1.4* 1.6* 1.5* 2.3 1.5*  PHOS 3.2  --   --  2.7  --   --   --   < > = values in this interval not displayed. Liver Function Tests:  Recent Labs Lab 06/29/15 0615 06/30/15 0250 07/01/15 0410 07/04/15 0253  AST 97* 68* 69* 39  ALT 55 44 41 26  ALKPHOS 61 58 51 56  BILITOT 1.5* 1.4* 1.0 1.2  PROT 6.6 6.7 7.0 6.7  ALBUMIN 3.5 3.6 3.4* 3.0*   No results for input(s): LIPASE, AMYLASE in the last 168 hours. No results for input(s): AMMONIA in the last 168 hours. CBC:  Recent Labs Lab 06/29/15 0615 06/30/15 0250 07/01/15 0410 07/04/15 0253  WBC 10.0 6.7 7.8 7.0  NEUTROABS 8.9*  --   --   --   HGB 14.1 13.8 13.5 13.6  HCT 38.4* 38.2* 38.7* 39.8  MCV 82.9 84.0 86.2 87.5  PLT 260 256 200 258   Cardiac Enzymes: No results for input(s): CKTOTAL, CKMB, CKMBINDEX, TROPONINI in the last 168 hours. BNP: Invalid input(s): POCBNP CBG:  Recent Labs Lab 06/29/15 2129  GLUCAP 146*    Recent Results (from the past 240 hour(s))  MRSA PCR Screening     Status: None   Collection Time: 06/29/15  4:21 PM  Result Value Ref Range Status   MRSA by PCR NEGATIVE NEGATIVE Final     Comment:        The GeneXpert MRSA Assay (FDA approved for NASAL specimens only), is one component of a comprehensive MRSA colonization surveillance program. It is not intended to diagnose MRSA infection nor to guide or monitor treatment for MRSA infections.      Scheduled Meds: . atorvastatin  40  mg Oral q1800  . carvedilol  3.125 mg Oral BID WC  . cholecalciferol  1,000 Units Oral Daily  . docusate sodium  100 mg Oral BID  . doxycycline  100 mg Oral Q12H  . enoxaparin (LOVENOX) injection  40 mg Subcutaneous Q24H  . ferrous sulfate  325 mg Oral BID WC  . folic acid  1 mg Oral Daily  . ipratropium-albuterol  3 mL Nebulization Q6H  . pantoprazole  40 mg Oral Daily  . sodium chloride  3 mL Intravenous Q12H  . tamsulosin  0.4 mg Oral Daily  . thiamine  100 mg Oral Daily  . vitamin B-12  250 mcg Oral Daily  . vitamin C  500 mg Oral BID   Continuous Infusions:

## 2015-07-05 DIAGNOSIS — M25561 Pain in right knee: Secondary | ICD-10-CM

## 2015-07-05 LAB — BASIC METABOLIC PANEL
Anion gap: 9 (ref 5–15)
BUN: 8 mg/dL (ref 6–20)
CO2: 25 mmol/L (ref 22–32)
Calcium: 8.6 mg/dL — ABNORMAL LOW (ref 8.9–10.3)
Chloride: 94 mmol/L — ABNORMAL LOW (ref 101–111)
Creatinine, Ser: 0.83 mg/dL (ref 0.61–1.24)
GFR calc Af Amer: >60 mL/min (ref >60)
GFR calc non Af Amer: >60 mL/min (ref >60)
Glucose, Bld: 115 mg/dL — ABNORMAL HIGH (ref 65–99)
Potassium: 3.5 mmol/L (ref 3.5–5.1)
Sodium: 128 mmol/L — ABNORMAL LOW (ref 135–145)

## 2015-07-05 LAB — CBC
HCT: 35 % — ABNORMAL LOW (ref 39.0–52.0)
Hemoglobin: 12.1 g/dL — ABNORMAL LOW (ref 13.0–17.0)
MCH: 29.6 pg (ref 26.0–34.0)
MCHC: 34.6 g/dL (ref 30.0–36.0)
MCV: 85.6 fL (ref 78.0–100.0)
Platelets: 265 10*3/uL (ref 150–400)
RBC: 4.09 MIL/uL — ABNORMAL LOW (ref 4.22–5.81)
RDW: 14.2 % (ref 11.5–15.5)
WBC: 7.7 10*3/uL (ref 4.0–10.5)

## 2015-07-05 LAB — URIC ACID: Uric Acid, Serum: 3.7 mg/dL — ABNORMAL LOW (ref 4.4–7.6)

## 2015-07-05 LAB — MAGNESIUM: Magnesium: 1.5 mg/dL — ABNORMAL LOW (ref 1.7–2.4)

## 2015-07-05 MED ORDER — NAPROXEN SODIUM 275 MG PO TABS
550.0000 mg | ORAL_TABLET | Freq: Two times a day (BID) | ORAL | Status: DC
  Administered 2015-07-05 – 2015-07-07 (×5): 550 mg via ORAL
  Filled 2015-07-05 (×8): qty 2

## 2015-07-05 MED ORDER — COLCHICINE 0.6 MG PO TABS
0.6000 mg | ORAL_TABLET | Freq: Two times a day (BID) | ORAL | Status: DC
  Administered 2015-07-05 – 2015-07-08 (×7): 0.6 mg via ORAL
  Filled 2015-07-05 (×10): qty 1

## 2015-07-05 MED ORDER — NAPROXEN SODIUM 550 MG PO TABS
550.0000 mg | ORAL_TABLET | Freq: Two times a day (BID) | ORAL | Status: DC
  Filled 2015-07-05 (×3): qty 1

## 2015-07-05 MED ORDER — MAGNESIUM OXIDE 400 (241.3 MG) MG PO TABS
200.0000 mg | ORAL_TABLET | Freq: Two times a day (BID) | ORAL | Status: DC
  Administered 2015-07-05 – 2015-07-09 (×10): 200 mg via ORAL
  Filled 2015-07-05: qty 0.5
  Filled 2015-07-05 (×2): qty 1
  Filled 2015-07-05 (×3): qty 0.5
  Filled 2015-07-05: qty 1
  Filled 2015-07-05 (×2): qty 0.5
  Filled 2015-07-05 (×2): qty 1

## 2015-07-05 NOTE — Care Management Important Message (Signed)
Important Message  Patient Details  Name: NICKLAUS ALVIAR MRN: [id number] Date of Birth: [birth year]   Medicare Important Message Given:  Yes-third notification given    Delorse Lek 07/05/2015, 11:00 AM

## 2015-07-05 NOTE — Progress Notes (Signed)
PT Cancellation Note  Patient Details Name: Francisco [name] MRN: [id number] DOB: 1946/05/03   Cancelled Treatment:    Reason Eval/Treat Not Completed: Pain limiting ability to participate . Pt with 10/10 R knee and L ankle pain. Pt just returned to bed from chair with extreme difficulty and increased pain. Educated pt on active ROM to R knee and L ankle to prevent stiffness and help with edema management. PT to return as able.   Kingsley Callander 07/05/2015, 2:34 PM  Kittie Plater, PT, DPT Pager #: [phone] Office #: [phone]

## 2015-07-05 NOTE — Progress Notes (Signed)
Occupational Therapy Evaluation Patient Details Name: Francisco [name] MRN: [id number] DOB: 1946/09/17 Today's Date: 07/05/2015    History of Present Illness This is a 69 year old male with history of celiac disease, hypertension, chronic ongoing alcoholism, recent issues with bilateral lower extremity edema, prior duodenal ulcer with GI bleeding and symptomatic anemia, chronic microcytic anemia, mild transaminitis as an outpatient in the past 2 weeks, BPH, GERD, mild hypertrophic obstructive cardiomyopathy with associated mild diastolic dysfunction who presents after onset of seizure activity.    Clinical Impression   Patient presents to OT with decreased ADL independence and safety due to the deficits listed below. He reports he will be living home alone as his "roommate" has been or will be "kicked out" of the home. He is currently unsafe to go home as he requires significant assistance with all basic mobility and ADLs. OT will follow.    Follow Up Recommendations  Supervision/Assistance - 24 hour;SNF    Equipment Recommendations  3 in 1 bedside comode    Recommendations for Other Services PT consult     Precautions / Restrictions Precautions Precautions: Fall Restrictions Weight Bearing Restrictions: No      Mobility Bed Mobility               General bed mobility comments: already OOB in recliner  Transfers Overall transfer level: Needs assistance Equipment used: Rolling walker (2 wheeled) Transfers: Sit to/from Stand Sit to Stand: Mod assist         General transfer comment: assist to power up; significant pain R knee/R great toe/L ankle    Balance                                            ADL  Patient able to perform grooming tasks from a seated position including washing face and hands; oral care, and brushing hair. Patient was also able to reach to ankles to pull up socks while legs were reclined. Patient is independent with  urinal use. He requires significant assistance with toilet transfers, bathing, and dressing.                                             Vision     Perception     Praxis      Pertinent Vitals/Pain Pain Assessment: 0-10 Pain Score: 10-Worst pain ever Pain Location: R knee, R great toe, L ankle Pain Descriptors / Indicators: Aching;Sore;Grimacing Pain Intervention(s): Limited activity within patient's tolerance;Monitored during session;Ice applied     Hand Dominance Right   Extremity/Trunk Assessment Upper Extremity Assessment Upper Extremity Assessment: Overall WFL for tasks assessed   Lower Extremity Assessment Lower Extremity Assessment: Defer to PT evaluation       Communication Communication Communication: No difficulties   Cognition Arousal/Alertness: Awake/alert;Lethargic Behavior During Therapy: WFL for tasks assessed/performed Overall Cognitive Status: Within Functional Limits for tasks assessed                     General Comments       Exercises       Shoulder Instructions      Home Living Family/patient expects to be discharged to:: Private residence Living Arrangements: Alone (roommate has been or to be kicked out per patient)   Type of  Home: House Home Access: Stairs to enter [employer] of Steps: 4 Entrance Stairs-Rails: None Home Layout: Two level;Able to live on main level with bedroom/bathroom     Bathroom Shower/Tub: Teacher, [employer] years/pre: Standard Bathroom Accessibility: No   Home Equipment: Clinical cytogeneticist - 4 wheels;Cane - single point          Prior Functioning/Environment Level of Independence: Independent             OT Diagnosis: Generalized weakness;Acute pain   OT Problem List: Decreased strength;Decreased activity tolerance;Decreased safety awareness;Decreased knowledge of use of DME or AE;Pain;Increased edema   OT Treatment/Interventions: Self-care/ADL  training;DME and/or AE instruction;Therapeutic activities;Patient/family education    OT Goals(Current goals can be found in the care plan section) Acute Rehab OT Goals Patient Stated Goal: to decrease pain OT Goal Formulation: With patient Time For Goal Achievement: 07/19/15 Potential to Achieve Goals: Good ADL Goals Pt Will Perform Grooming: with supervision;standing Pt Will Perform Upper Body Bathing: with supervision;with set-up;sitting Pt Will Perform Lower Body Bathing: with set-up;with supervision;sit to/from stand Pt Will Perform Upper Body Dressing: with set-up;with supervision;sitting Pt Will Perform Lower Body Dressing: with set-up;with supervision;sit to/from stand Pt Will Transfer to Toilet: with supervision;bedside commode Pt Will Perform Toileting - Clothing Manipulation and hygiene: with supervision;sit to/from stand  OT Frequency: Min 2X/week   Barriers to D/C: Decreased caregiver support  would be going home alone       Co-evaluation              End of Session Equipment Utilized During Treatment: Rolling walker  Activity Tolerance: Patient tolerated treatment well;Patient limited by pain Patient left: in chair;with call bell/phone within reach   Time: 7408-1448 OT Time Calculation (min): 24 min Charges:  OT General Charges $OT Visit: 1 Procedure OT Evaluation $Initial OT Evaluation Tier I: 1 Procedure OT Treatments $Self Care/Home Management : 8-22 mins G-Codes:    Liadan Guizar A 07-15-15, 10:58 AM

## 2015-07-05 NOTE — Progress Notes (Signed)
Patient ID: Francisco [name], male   DOB: [birth year], 69 y.o.   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 Pt has no recollection of seizure activity but per EMS report, pt appeared post ictal upon their arrival.   In the ER labs revealed Na 120, K 2.9, Cr 1.63, AST 97, total bilirubin 1.5. Outpatient laboratory noted Na 123 on 8/3 with Cr 0.88. AST at that time was 121 with an ALT of 60 and a normal total bilirubin. In the ER CT of the head was unremarkable.   Assessment/Plan:    Alcohol withdrawal seizure/ Alcohol abuse - no seizures while inpatient  - Ct head unremarkable -no need for AEDs at this time  Right knee, L ankle and R great toe swelling/pain -suspect Gouty flare, no fever or leukocytosis -check uric acid -add Naproxen BID and Colchicine -stop HCTZ at discharge -Xrays unremarkable  Hyponatremia - secondary to alcohol abuse  - workup at this time would not be accurate given his use of multiple diuretics prior to admission - Na improved and stable  Acute renal failure  - pre renal in etiology, resolved   Mild transaminitis  - alcohol induced hepatitis, LFT's elevated in the pattern of alcohol related damage - resolved   Hypokalemia  - improved  Hypomagnesemia  - given Iv Mag x2 -start PO mag too  Anemia of chronic disease, IDA -hb WNL now  B LE edema secondary to  - Resolving   HOCM - Diastolic CHF, chronic, grade I  - Echocardiogram; with normal EF - Continue Coreg 3.125 mg BID  HTN - reasonable  inpatient control   GERD - continue PPI  HLD  - Started Lipitor 40 mg daily   DVT prophylaxis - Lovenox SQ  Code Status: Full.  Family Communication:  plan of care discussed with the patient Disposition Plan: Home if joint pains better in 1-2days  IV access:  Peripheral IV  Procedures and diagnostic studies:    Dg Chest 2 View  07/03/2015  Mild diffuse peribronchial cuffing, suggestive of acute bronchitis.   Ct Head Wo Contrast  06/29/2015   Atrophy and chronic small vessel ischemic change. No acute intracranial abnormality.     Dg Chest Port 1 View 07/01/2015   Hypoinflation of the lungs. No acute cardiopulmonary abnormality seen.    Dg Knee Complete 4 Views Right 06/29/2015   No acute bony abnormality of the right knee is observed. If there are clinical concerns of significant soft tissue injury-internal derangement, MRI would be the most useful imaging step.   Medical Consultants:  None  Other Consultants:  PT  IAnti-Infectives:   Doxycycline 8/28 -->  Francisco Polite, MD  [facility] Pager [phone]  If 7PM-7AM, please contact night-coverage [email or web address] Password [email or web address] 07/05/2015, 10:03 AM   LOS: 6 days   HPI/Subjective: No events overnight. Right knee pain.   Objective: Filed Vitals:   07/04/15 0914 07/04/15 1726 07/04/15 2204 07/05/15 0500  BP:  187/68 148/61 155/71  Pulse:  78 77 75  Temp:  97.8 F (36.6 C) 99.2 F (37.3 C) 98 F (36.7 C)  TempSrc:  Oral Oral   Resp:  16  16 16  Height:      Weight:    111.766 kg (246 lb 6.4 oz)  SpO2: 96% 97% 96% 96%    Intake/Output Summary (Last 24 hours) at 07/05/15 1003 Last data filed at 07/05/15 2500  Gross per 24 hour  Intake    963 ml  Output   1100 ml  Net   -137 ml    Exam:   General:  Pt is alert, follows commands appropriately, not in acute distress  Cardiovascular: Regular rate and rhythm, no rubs, no gallops  Respiratory: Clear to auscultation bilaterally, no wheezing, diminished breath sounds  at bases   Abdomen: Soft, non tender, non distended, bowel sounds present, no guarding  Extremities: +1 bilateral LE edema with chronic venous stasis changes, right knee TTP and warm, swelling   Data Reviewed: Basic Metabolic Panel:  Recent Labs Lab 06/29/15 0809  07/01/15 0410 07/02/15 1911 07/03/15 0300 07/04/15 0253 07/05/15 0309  NA 120*  < > 129* 128* 127* 129* 128*  K 3.0*  < > 3.4* 3.9 3.6 3.4* 3.5  CL 76*  < > 90* 93* 93* 93* 94*  CO2 31  < > 30 26 27 27 25   GLUCOSE 106*  < > 118* 141* 121* 111* 115*  BUN 9  < > 5* 11 10 8 8   CREATININE 1.37*  < > 0.88 0.92 0.82 0.89 0.83  CALCIUM 8.7*  < > 8.5* 8.8* 8.8* 8.9 8.6*  MG 1.3*  < > 1.6* 1.5* 2.3 1.5* 1.5*  PHOS 3.2  --  2.7  --   --   --   --   < > = values in this interval not displayed. Liver Function Tests:  Recent Labs Lab 06/29/15 0615 06/30/15 0250 07/01/15 0410 07/04/15 0253  AST 97* 68* 69* 39  ALT 55 44 41 26  ALKPHOS 61 58 51 56  BILITOT 1.5* 1.4* 1.0 1.2  PROT 6.6 6.7 7.0 6.7  ALBUMIN 3.5 3.6 3.4* 3.0*   No results for input(s): LIPASE, AMYLASE in the last 168 hours. No results for input(s): AMMONIA in the last 168 hours. CBC:  Recent Labs Lab 06/29/15 0615 06/30/15 0250 07/01/15 0410 07/04/15 0253 07/05/15 0309  WBC 10.0 6.7 7.8 7.0 7.7  NEUTROABS 8.9*  --   --   --   --   HGB 14.1 13.8 13.5 13.6 12.1*  HCT 38.4* 38.2* 38.7* 39.8 35.0*  MCV 82.9 84.0 86.2 87.5 85.6  PLT 260 256 200 258 265   Cardiac Enzymes: No results for input(s): CKTOTAL, CKMB, CKMBINDEX, TROPONINI in the last 168 hours. BNP: Invalid input(s): POCBNP CBG:  Recent Labs Lab 06/29/15 2129  GLUCAP 146*    Recent Results (from the past 240 hour(s))  MRSA PCR Screening     Status: None   Collection Time: 06/29/15  4:21 PM  Result Value Ref Range Status   MRSA by PCR NEGATIVE NEGATIVE Final    Comment:        The GeneXpert MRSA Assay (FDA approved for NASAL specimens only), is one component of  a comprehensive MRSA colonization surveillance program. It is not intended to diagnose MRSA infection nor to guide or monitor treatment for MRSA infections.      Scheduled Meds: . atorvastatin  40 mg Oral q1800  . carvedilol  3.125 mg Oral BID WC  . cholecalciferol  1,000 Units Oral Daily  . colchicine  0.6 mg Oral BID  . docusate sodium  100 mg Oral BID  .  doxycycline  100 mg Oral Q12H  . enoxaparin (LOVENOX) injection  40 mg Subcutaneous Q24H  . ferrous sulfate  325 mg Oral BID WC  . folic acid  1 mg Oral Daily  . naproxen sodium  550 mg Oral BID WC  . pantoprazole  40 mg Oral Daily  . sodium chloride  3 mL Intravenous Q12H  . tamsulosin  0.4 mg Oral Daily  . thiamine  100 mg Oral Daily  . vitamin B-12  250 mcg Oral Daily  . vitamin C  500 mg Oral BID   Continuous Infusions:

## 2015-07-06 LAB — BASIC METABOLIC PANEL
Anion gap: 8 (ref 5–15)
BUN: 11 mg/dL (ref 6–20)
CO2: 29 mmol/L (ref 22–32)
Calcium: 9 mg/dL (ref 8.9–10.3)
Chloride: 93 mmol/L — ABNORMAL LOW (ref 101–111)
Creatinine, Ser: 0.88 mg/dL (ref 0.61–1.24)
GFR calc Af Amer: >60 mL/min (ref >60)
GFR calc non Af Amer: >60 mL/min (ref >60)
Glucose, Bld: 105 mg/dL — ABNORMAL HIGH (ref 65–99)
Potassium: 3.5 mmol/L (ref 3.5–5.1)
Sodium: 130 mmol/L — ABNORMAL LOW (ref 135–145)

## 2015-07-06 LAB — CBC
HCT: 36.5 % — ABNORMAL LOW (ref 39.0–52.0)
Hemoglobin: 12.4 g/dL — ABNORMAL LOW (ref 13.0–17.0)
MCH: 29.5 pg (ref 26.0–34.0)
MCHC: 34 g/dL (ref 30.0–36.0)
MCV: 86.7 fL (ref 78.0–100.0)
Platelets: 296 10*3/uL (ref 150–400)
RBC: 4.21 MIL/uL — ABNORMAL LOW (ref 4.22–5.81)
RDW: 14.4 % (ref 11.5–15.5)
WBC: 6.8 10*3/uL (ref 4.0–10.5)

## 2015-07-06 NOTE — Progress Notes (Signed)
Chaplain responded to spiritual care consult requesting help with advance directive. I delivered AD packet to pt, not realizing at the time that Chaplain Oswaldo Milian was already involved with this consult. Pt said he would give the forms to his sister and discuss them with her when she returned later today. Pt then shared with me about a personal situation he is involved with outside the hospital. We talked a few minutes until physician came to see pt and at that point I departed.

## 2015-07-06 NOTE — Progress Notes (Signed)
Responded to spiritual consult to assist patient with  Advance Directives.  Upon my review of patients documents I discovered that  it was for durable power of attorney.  The document was a form that Patient's  sister got online regarding patienst finanical matters and required an outside notary.  Patient and family was advised that we only did healthcare POA.  I offered  HPOA but sister insisted that she had what she needed. I spoke with unit Director regarding patients sister concerns and offered our services if needed.  Will follow as needed.

## 2015-07-06 NOTE — Clinical Social Work Note (Signed)
CSW spoke with patient's sister Lavera Guise [phone] who is the main contact for patient about SNF placement for short term rehab.  Patient and sister are agreeable to going to a SNF for short term rehab.  Patient has been faxed out to SNFs in [facility] and [place].  Patient's sister was explained process for SNF placement and explained about how insurance pays for his stay.  Patient's sister did not have any other questions, formal assessment to follow.  Jones Broom. Chalmers, MSW, [place] 07/06/2015 5:34 PM

## 2015-07-06 NOTE — Progress Notes (Signed)
Physical Therapy Treatment Patient Details Name: Francisco [name] MRN: [id number] DOB: 1946/01/05 Today's Date: 07/06/2015    History of Present Illness This is a 69 year old male with history of celiac disease, hypertension, chronic ongoing alcoholism, recent issues with bilateral lower extremity edema, prior duodenal ulcer with GI bleeding and symptomatic anemia, chronic microcytic anemia, mild transaminitis as an outpatient in the past 2 weeks, BPH, GERD, mild hypertrophic obstructive cardiomyopathy with associated mild diastolic dysfunction who presents after onset of seizure activity.     PT Comments    Pt was able to walk more than previous visits, but has poor awareness of his limitations.  He is a perfect SNF candidate given his inability to stabilize in standing and his need for increasing LE balance and proximal strength.  Will see daily if needed until dc.  Follow Up Recommendations  Home health PT;Supervision for mobility/OOB     Equipment Recommendations  Rolling walker with 5" wheels (if doesn't have one)    Recommendations for Other Services       Precautions / Restrictions Precautions Precautions: Fall Restrictions Weight Bearing Restrictions: No    Mobility  Bed Mobility Overal bed mobility: Needs Assistance Bed Mobility: Sit to Supine     Supine to sit: Supervision        Transfers Overall transfer level: Needs assistance Equipment used: Rolling walker (2 wheeled);1 person hand held assist Transfers: Sit to/from [facility] Sit to Stand: Mod assist (feet slide without intervention) Stand pivot transfers: Min assist;Mod assist       General transfer comment: assist to power up; significant pain R knee/R great toe/L ankle  Ambulation/Gait Ambulation/Gait assistance: Min assist Ambulation Distance (Feet): 20 Feet Assistive device: 1 person hand held assist;Rolling walker (2 wheeled) Gait Pattern/deviations: Step-through  pattern;Trunk flexed;Wide base of support;Antalgic Gait velocity: reduced Gait velocity interpretation: Below normal speed for age/gender General Gait Details: better advancement of LLE with walker    Stairs            Wheelchair Mobility    Modified Rankin (Stroke Patients Only)       Balance Overall balance assessment: Needs assistance Sitting-balance support: Feet supported Sitting balance-Leahy Scale: Good     Standing balance support: Bilateral upper extremity supported Standing balance-Leahy Scale: Poor Standing balance comment: especially unsteady on initial standing                    Cognition Arousal/Alertness: Awake/alert Behavior During Therapy: WFL for tasks assessed/performed Overall Cognitive Status: Within Functional Limits for tasks assessed                      Exercises      General Comments General comments (skin integrity, edema, etc.): Skin on iced joints is bruised and edema is present, very traumatized      Pertinent Vitals/Pain Pain Assessment: Faces Pain Score: 4  Faces Pain Scale: Hurts little more Pain Location: L ankle and R knee Pain Descriptors / Indicators: Aching Pain Intervention(s): Limited activity within patient's tolerance;Monitored during session;Repositioned;Ice applied    Home Living                      Prior Function            PT Goals (current goals can now be found in the care plan section) Acute Rehab PT Goals Patient Stated Goal: to get some rest today Progress towards PT goals: Progressing toward goals    Frequency  Min 3X/week    PT Plan Current plan remains appropriate    Co-evaluation             End of Session   Activity Tolerance: Patient tolerated treatment well Patient left: in bed;in CPM;with nursing/sitter in room     Time: 1330-1350 PT Time Calculation (min) (ACUTE ONLY): 20 min  Charges:  $Gait Training: 8-22 mins                    G Codes:       Ramond Dial 2015-07-08, 2:00 PM   Mee Hives, PT MS Acute Rehab Dept. Number: [facility] [id number] and [place] [phone]

## 2015-07-06 NOTE — Progress Notes (Signed)
Patient ID: Francisco [name], male   DOB: 03-15-1946, 69 y.o.   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 Pt has no recollection of seizure activity but per EMS report, pt appeared post ictal upon their arrival.  In the ER CT of the head was unremarkable.  Since admission no further seizures, his Na and AKI improved,  Transferred to floor from SDU 8/29, had increased joint pain and swelling of R knee, L ankle and R big toe, started on Gout Rx with NSAIDs and Colchicine, improving  Assessment/Plan:    Alcohol withdrawal seizure/ Alcohol abuse - no seizures while inpatient  - Ct head unremarkable -no need for AEDs at this time  Right knee, L ankle and R great toe swelling/pain -suspect Gouty flare, no fever or leukocytosis, also bruising around R knee secondary to fall prior to admission -Risk factors for gout, ETOH and HCTZ  -Uric acid low but can be unreliable in Acute flare -continue Naproxen BID and Colchicine for 1 more day -stop HCTZ at discharge -Xrays unremarkable -PT to re-evaluate and determine need for Rehab  Hyponatremia - secondary to alcohol abuse  - workup at this time would not be accurate given his use of multiple diuretics prior to admission - Na improved and stable  Acute renal failure  - pre renal in etiology, resolved   Mild transaminitis  - alcohol induced hepatitis, LFT's elevated in the pattern of alcohol related damage - resolved   Hypokalemia  - improved  Hypomagnesemia  - given Iv Mag x2 -start PO  mag too  Anemia of chronic disease, IDA -hb WNL now  B LE edema secondary to  - Resolving   HOCM - Diastolic CHF, chronic, grade I  - Echocardiogram; with normal EF - Continue Coreg 3.125 mg BID  HTN - reasonable inpatient control   GERD - continue PPI  HLD  - Started Lipitor 40 mg daily   DVT prophylaxis - Lovenox SQ  Code Status: Full.  Family Communication:  plan of care discussed with the patient Disposition Plan: Home vs SNF soon  IV access:  Peripheral IV  Procedures and diagnostic studies:    Dg Chest 2 View  07/03/2015  Mild diffuse peribronchial cuffing, suggestive of acute bronchitis.   Ct Head Wo Contrast  06/29/2015   Atrophy and chronic small vessel ischemic change. No acute intracranial abnormality.     Dg Chest Port 1 View 07/01/2015   Hypoinflation of the lungs. No acute cardiopulmonary abnormality seen.    Dg Knee Complete 4 Views Right 06/29/2015   No acute bony abnormality of the right knee is observed. If there are clinical concerns of significant soft tissue injury-internal derangement, MRI would be the most useful imaging step.   Medical Consultants:  None  Other Consultants:  PT  IAnti-Infectives:   Doxycycline 8/28 -->  Francisco Polite, MD  [facility] Pager [phone]  If 7PM-7AM, please contact night-coverage [email or web address] Password [email or web address] 07/06/2015, 11:05 AM   LOS: 7 days   HPI/Subjective: No events overnight. Right knee pain.   Objective: Filed Vitals:   07/05/15 0500 07/05/15 1308 07/05/15 2217 07/06/15 0632  BP: 155/71 164/68  152/69 156/66  Pulse: 75 66 72 74  Temp: 98 F (36.7 C) 97.6 F (36.4 C) 97.9 F (36.6 C) 98 F (36.7 C)  TempSrc:  Oral Oral Oral  Resp: 16 18 18 19   Height:      Weight: 111.766 kg (246 lb 6.4 oz)   113.218 kg (249 lb 9.6 oz)  SpO2: 96% 98% 98% 98%    Intake/Output Summary (Last 24 hours) at 07/06/15 1105 Last data filed at 07/06/15 0811  Gross per 24 hour  Intake    813 ml  Output    670 ml   Net    143 ml    Exam:   General:  Pt is alert, follows commands appropriately, not in acute distress  Cardiovascular: Regular rate and rhythm, no rubs, no gallops  Respiratory: Clear to auscultation bilaterally, no wheezing, diminished breath sounds at bases   Abdomen: Soft, non tender, non distended, bowel sounds present, no guarding  Extremities: +1 bilateral LE edema with chronic venous stasis changes, right knee TTP and warm-bruising around R knee, L ankle swollen and R big toe swelling, all joints much improved from 8/29  Data Reviewed: Basic Metabolic Panel:  Recent Labs Lab 07/01/15 0410 07/02/15 1911 07/03/15 0300 07/04/15 0253 07/05/15 0309 07/06/15 0319  NA 129* 128* 127* 129* 128* 130*  K 3.4* 3.9 3.6 3.4* 3.5 3.5  CL 90* 93* 93* 93* 94* 93*  CO2 30 26 27 27 25 29   GLUCOSE 118* 141* 121* 111* 115* 105*  BUN 5* 11 10 8 8 11   CREATININE 0.88 0.92 0.82 0.89 0.83 0.88  CALCIUM 8.5* 8.8* 8.8* 8.9 8.6* 9.0  MG 1.6* 1.5* 2.3 1.5* 1.5*  --   PHOS 2.7  --   --   --   --   --    Liver Function Tests:  Recent Labs Lab 06/30/15 0250 07/01/15 0410 07/04/15 0253  AST 68* 69* 39  ALT 44 41 26  ALKPHOS 58 51 56  BILITOT 1.4* 1.0 1.2  PROT 6.7 7.0 6.7  ALBUMIN 3.6 3.4* 3.0*   No results for input(s): LIPASE, AMYLASE in the last 168 hours. No results for input(s): AMMONIA in the last 168 hours. CBC:  Recent Labs Lab 06/30/15 0250 07/01/15 0410 07/04/15 0253 07/05/15 0309 07/06/15 0319  WBC 6.7 7.8 7.0 7.7 6.8  HGB 13.8 13.5 13.6 12.1* 12.4*  HCT 38.2* 38.7* 39.8 35.0* 36.5*  MCV 84.0 86.2 87.5 85.6 86.7  PLT 256 200 258 265 296   Cardiac Enzymes: No results for input(s): CKTOTAL, CKMB, CKMBINDEX, TROPONINI in the last 168 hours. BNP: Invalid input(s): POCBNP CBG:  Recent Labs Lab 06/29/15 2129  GLUCAP 146*    Recent Results (from the past 240 hour(s))  MRSA PCR Screening     Status: None   Collection Time: 06/29/15  4:21 PM  Result Value  Ref Range Status   MRSA by PCR NEGATIVE NEGATIVE Final    Comment:        The GeneXpert MRSA Assay (FDA approved for NASAL specimens only), is one component of a comprehensive MRSA colonization surveillance program. It is not intended to diagnose MRSA infection nor to guide or monitor treatment for MRSA infections.      Scheduled Meds: . atorvastatin  40 mg Oral q1800  . carvedilol  3.125 mg Oral BID WC  . cholecalciferol  1,000 Units Oral Daily  . colchicine  0.6 mg Oral BID  . docusate sodium  100 mg Oral BID  .  enoxaparin (LOVENOX) injection  40 mg Subcutaneous Q24H  . ferrous sulfate  325 mg Oral BID WC  . folic acid  1 mg Oral Daily  . magnesium oxide  200 mg Oral BID  . naproxen sodium  550 mg Oral BID WC  . pantoprazole  40 mg Oral Daily  . sodium chloride  3 mL Intravenous Q12H  . tamsulosin  0.4 mg Oral Daily  . thiamine  100 mg Oral Daily  . vitamin B-12  250 mcg Oral Daily  . vitamin C  500 mg Oral BID   Continuous Infusions:

## 2015-07-06 NOTE — Progress Notes (Signed)
Occupational Therapy Treatment Patient Details Name: Francisco [name] MRN: [id number] DOB: Dec 21, 1945 Today's Date: 07/06/2015    History of present illness This is a 69 year old male with history of celiac disease, hypertension, chronic ongoing alcoholism, recent issues with bilateral lower extremity edema, prior duodenal ulcer with GI bleeding and symptomatic anemia, chronic microcytic anemia, mild transaminitis as an outpatient in the past 2 weeks, BPH, GERD, mild hypertrophic obstructive cardiomyopathy with associated mild diastolic dysfunction who presents after onset of seizure activity.    OT comments  Pt demonstrates decr activity tolerance, decr ambulation with ataxic gait, and balance deficits affecting all adls. Pt remains a strong SNF candidate. Recommend d/c to snf due to unsafe to d/c home alone.   Follow Up Recommendations  SNF;Supervision/Assistance - 24 hour    Equipment Recommendations  3 in 1 bedside comode    Recommendations for Other Services      Precautions / Restrictions Precautions Precautions: Fall Restrictions Weight Bearing Restrictions: No       Mobility Bed Mobility Overal bed mobility: Needs Assistance Bed Mobility: Sit to Supine     Supine to sit: Supervision Sit to supine: Min assist;HOB elevated   General bed mobility comments: cues for safety   Transfers Overall transfer level: Needs assistance Equipment used: Rolling walker (2 wheeled) Transfers: Sit to/from Stand Sit to Stand: Mod assist Stand pivot transfers: Min assist;Mod assist       General transfer comment: cues for safety and (A) to power up into standing    Balance Overall balance assessment: Needs assistance Sitting-balance support: Feet supported Sitting balance-Leahy Scale: Good     Standing balance support: Bilateral upper extremity supported;During functional activity Standing balance-Leahy Scale: Poor Standing balance comment: heavy reliance on RW                    ADL Overall ADL's : Needs assistance/impaired     Grooming: Wash/dry face;Set up;Sitting Grooming Details (indicate cue type and reason): pt reports completing bath in chair this AM   Upper Body Bathing Details (indicate cue type and reason): gown down inside out currently         Lower Body Dressing: Maximal assistance   Toilet Transfer: Moderate assistance;Ambulation;BSC Toilet Transfer Details (indicate cue type and reason): use of bil UE adn cues for proper hand placement         Functional mobility during ADLs: Moderate assistance;Rolling walker General ADL Comments: Pt required x3 rest breaks to go ~12 ft ( chair to opposite side of the bed) pt very anxious and needed multi modal cues to progress to EOB slowly. pt becoming very agitated with mobility. "I am going back to bed" pt reassured that Ot would help him to the bed and pt had decided that ambulation to the door was the goal to the session. Pt once supine (A) with other needs diet ( showed the menu) tray setup with person items (paper, puzzles phone etc) and ice applied to [place]. pt reports at end of session "i am open to this therapy and I will work again with yall"      Vision                     Perception     Praxis      Cognition   Behavior During Therapy: Impulsive Overall Cognitive Status: Within Functional Limits for tasks assessed  Extremity/Trunk Assessment               Exercises     Shoulder Instructions       General Comments      Pertinent Vitals/ Pain       Pain Assessment: 0-10 Pain Score: 7  Faces Pain Scale: Hurts little more Pain Location: BIL LE Pain Descriptors / Indicators: Discomfort Pain Intervention(s): Repositioned;Ice applied  Home Living                                          Prior Functioning/Environment              Frequency Min 2X/week     Progress Toward Goals  OT  Goals(current goals can now be found in the care plan section)  Progress towards OT goals: Progressing toward goals  Acute Rehab OT Goals Patient Stated Goal: pt using profound language verbalized his desire to have male roommate exit the home they share today  OT Goal Formulation: With patient Time For Goal Achievement: 07/19/15 Potential to Achieve Goals: Good ADL Goals Pt Will Perform Grooming: with supervision;standing Pt Will Perform Upper Body Bathing: with supervision;with set-up;sitting Pt Will Perform Lower Body Bathing: with set-up;with supervision;sit to/from stand Pt Will Perform Upper Body Dressing: with set-up;with supervision;sitting Pt Will Perform Lower Body Dressing: with set-up;with supervision;sit to/from stand Pt Will Transfer to Toilet: with supervision;bedside commode Pt Will Perform Toileting - Clothing Manipulation and hygiene: with supervision;sit to/from stand  Plan Discharge plan remains appropriate    Co-evaluation                 End of Session Equipment Utilized During Treatment: Rolling walker;Gait belt   Activity Tolerance Patient limited by pain   Patient Left in bed;with call bell/phone within reach   Nurse Communication Mobility status;Precautions        Time: 1222-4114 OT Time Calculation (min): 21 min  Charges: OT General Charges $OT Visit: 1 Procedure OT Treatments $Self Care/Home Management : 8-22 mins  Parke Poisson B 07/06/2015, 2:46 PM   Jeri Modena   OTR/L Pager: [phone] Office: [phone] .

## 2015-07-07 DIAGNOSIS — F10239 Alcohol dependence with withdrawal, unspecified: Secondary | ICD-10-CM

## 2015-07-07 DIAGNOSIS — K219 Gastro-esophageal reflux disease without esophagitis: Secondary | ICD-10-CM

## 2015-07-07 LAB — MAGNESIUM: Magnesium: 1.6 mg/dL — ABNORMAL LOW (ref 1.7–2.4)

## 2015-07-07 MED ORDER — LORAZEPAM 2 MG/ML IJ SOLN
INTRAMUSCULAR | Status: AC
  Administered 2015-07-07: 1 mg via INTRAVENOUS
  Filled 2015-07-07: qty 1

## 2015-07-07 MED ORDER — LORAZEPAM 2 MG/ML IJ SOLN
2.0000 mg | Freq: Once | INTRAMUSCULAR | Status: AC
  Administered 2015-07-07: 1 mg via INTRAVENOUS

## 2015-07-07 MED ORDER — HYDRALAZINE HCL 20 MG/ML IJ SOLN
5.0000 mg | Freq: Four times a day (QID) | INTRAMUSCULAR | Status: DC | PRN
  Administered 2015-07-09: 5 mg via INTRAVENOUS
  Filled 2015-07-07 (×2): qty 1

## 2015-07-07 NOTE — Progress Notes (Signed)
Patient ID: DILLION STOWERS, male   DOB: 01-28-1946, 69 y.o.   MRN: [id number]  Patient Name: Francisco [name]   MRN: [id number]   Date of Birth/ Sex: 11/12/1945 , male      Admission Date: 06/29/2015  Attending Provider: Velvet Bathe, MD  Primary Diagnosis: Alcohol withdrawal seizure   Indication: Pt was in his usual state of health until this PM, when he was noted to be convulsing. Code blue was subsequently called. At the time of arrival on scene, ACLS protocol was underway. He had a pulse. His airway was secured with a nasal trumpet and he was saturating 100% on room air. His seizure stopped spontaneously and abruptly and no Ativan was given.   Technical Description:  - CPR performance duration:  0  minutes  - Was defibrillation or cardioversion used? No   - Was external pacer placed? Yes  - Was patient intubated pre/post CPR? No   Medications Administered: Y = Yes; Blank = No Amiodarone    Atropine    Calcium    Epinephrine    Lidocaine    Magnesium    Norepinephrine    Phenylephrine    Sodium bicarbonate    Vasopressin     Post CPR evaluation:  - Final Status - Was patient successfully resuscitated ? Yes - What is current rhythm? Normal sinus rhythm - What is current hemodynamic status? Hypertensive but stable  Miscellaneous Information:  - Labs sent, including: BMP  - Primary team notified?  Yes  - Family Notified? Yes  - Additional notes/ transfer status:  None     Loleta Chance, MD  07/07/2015, 7:43 PM

## 2015-07-07 NOTE — Progress Notes (Signed)
Patient's b/p is elevated at this time- spoke with MD Wendee Beavers- told to give the patient time to calm down from everything while up here on floor. MD placed PRN orders for step-down.

## 2015-07-07 NOTE — Progress Notes (Signed)
Patient ID: Francisco [name], male   DOB: April 24, 1946, 69 y.o.   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 Pt has no recollection of seizure activity but per EMS report, pt appeared post ictal upon their arrival.  In the ER CT of the head was unremarkable.  Since admission no further seizures, his Na and AKI improved,  Transferred to floor from SDU 8/29, had increased joint pain and swelling of R knee, L ankle and R big toe, started on Gout Rx with NSAIDs and Colchicine, improving  Code blue was called around 1800 due to patient post respiratory arrest after a witnessed seizure.  When I arrived in room patient was alert and awake but confused. No ativan administered by code blue team. I requested ativan be administered and patient transferred to stepdown unit.  Assessment/Plan:    Alcohol withdrawal seizure/ Alcohol abuse - breakthrough seizures, transfer to stepdown. Would continue ativan administration through CIWA protocol - Ct head unremarkable - no need for AEDs at this time  Right knee, L ankle and R great toe swelling/pain - suspect Gouty flare, no fever or leukocytosis, also bruising around R knee secondary to fall prior to admission - Risk factors for gout, ETOH and HCTZ  - Uric acid low but can be unreliable in Acute flare - continue Naproxen BID and Colchicine for 1 more day - stop HCTZ at discharge - Xrays unremarkable - PT to re-evaluate and determine need for Rehab  Hyponatremia - secondary to alcohol abuse  - workup at this  time would not be accurate given his use of multiple diuretics prior to admission - Na improving and stable  Acute renal failure  - pre renal in etiology, resolved   Mild transaminitis  - alcohol induced hepatitis, LFT's elevated in the pattern of alcohol related damage - resolved   Hypokalemia  - resolved  Hypomagnesemia  - given Iv Mag x2, oral magnesium started - reassess magnesium levels.  Anemia of chronic disease, IDA -hb WNL now  B LE edema secondary to  - Resolving   HOCM - Diastolic CHF, chronic, grade I  - Echocardiogram; with normal EF - Continue Coreg 3.125 mg BID  HTN - reasonable inpatient control   GERD - continue PPI  HLD  - Started Lipitor 40 mg daily   DVT prophylaxis - Lovenox SQ  Code Status: Full.  Family Communication:  plan of care discussed with the patient Disposition Plan: Home vs SNF soon  IV access:  Peripheral IV  Procedures and diagnostic studies:    Dg Chest 2 View  07/03/2015  Mild diffuse peribronchial cuffing, suggestive of acute bronchitis.   Ct Head Wo Contrast  06/29/2015   Atrophy and chronic small vessel ischemic change. No acute intracranial abnormality.     Dg Chest Port 1 View 07/01/2015   Hypoinflation of the lungs. No acute cardiopulmonary abnormality seen.    Dg Knee Complete 4 Views Right 06/29/2015   No acute bony abnormality of the right knee is observed. If there are clinical concerns of significant soft tissue injury-internal derangement, MRI would be the most useful imaging step.   Medical Consultants:  None  Other Consultants:  PT  IAnti-Infectives:   Doxycycline 8/28 -->  Velvet Bathe, MD  [facility] Pager [phone]  If 7PM-7AM, please contact night-coverage [email or web address] Password [facility] 07/07/2015, 6:29 PM   LOS: 8 days   HPI/Subjective: Pt had seizure.  But during my visit earlier in the day was talking to me without any problems or hint of anxiety.  Objective: Filed Vitals:   07/07/15 1300  07/07/15 1638 07/07/15 1803 07/07/15 1804  BP: 135/71 184/72 179/84 172/94  Pulse: 75 67 77 75  Temp: 98 F (36.7 C)     TempSrc: Oral     Resp: 20  18 18   Height:      Weight:      SpO2: 100%  98% 98%    Intake/Output Summary (Last 24 hours) at 07/07/15 1829 Last data filed at 07/07/15 1726  Gross per 24 hour  Intake   1586 ml  Output   1250 ml  Net    336 ml    Exam:   General:  Pt is alert and awake. Post ictal and confused.  Cardiovascular: Regular rate and rhythm, no rubs, no gallops  Respiratory: Clear to auscultation bilaterally, no wheezing, diminished breath sounds at bases   Abdomen: Soft, non tender, non distended, bowel sounds present, no guarding  Extremities: +1 bilateral LE edema with chronic venous stasis changes, right knee TTP and warm-bruising around R knee, L ankle swollen and R big toe swelling, all joints much improved from 8/29  Data Reviewed: Basic Metabolic Panel:  Recent Labs Lab 07/01/15 0410 07/02/15 1911 07/03/15 0300 07/04/15 0253 07/05/15 0309 07/06/15 0319  NA 129* 128* 127* 129* 128* 130*  K 3.4* 3.9 3.6 3.4* 3.5 3.5  CL 90* 93* 93* 93* 94* 93*  CO2 30 26 27 27 25 29   GLUCOSE 118* 141* 121* 111* 115* 105*  BUN 5* 11 10 8 8 11   CREATININE 0.88 0.92 0.82 0.89 0.83 0.88  CALCIUM 8.5* 8.8* 8.8* 8.9 8.6* 9.0  MG 1.6* 1.5* 2.3 1.5* 1.5*  --   PHOS 2.7  --   --   --   --   --    Liver Function Tests:  Recent Labs Lab 07/01/15 0410 07/04/15 0253  AST 69* 39  ALT 41 26  ALKPHOS 51 56  BILITOT 1.0 1.2  PROT 7.0 6.7  ALBUMIN 3.4* 3.0*   No results for input(s): LIPASE, AMYLASE in the last 168 hours. No results for input(s): AMMONIA in the last 168 hours. CBC:  Recent Labs Lab 07/01/15 0410 07/04/15 0253 07/05/15 0309 07/06/15 0319  WBC 7.8 7.0 7.7 6.8  HGB 13.5 13.6 12.1* 12.4*  HCT 38.7* 39.8 35.0* 36.5*  MCV 86.2 87.5 85.6 86.7  PLT 200 258 265 296   Cardiac Enzymes: No results for input(s): CKTOTAL, CKMB,  CKMBINDEX, TROPONINI in the last 168 hours. BNP: Invalid input(s): POCBNP CBG: No results for input(s): GLUCAP in the last 168 hours.  Recent Results (from the past 240 hour(s))  MRSA PCR Screening     Status: None   Collection Time: 06/29/15  4:21 PM  Result Value Ref Range Status   MRSA by PCR NEGATIVE NEGATIVE Final    Comment:        The GeneXpert MRSA Assay (FDA approved for NASAL specimens only), is one component of a comprehensive MRSA colonization surveillance program. It is not intended to diagnose MRSA infection nor to guide or monitor treatment for MRSA infections.  Scheduled Meds: . atorvastatin  40 mg Oral q1800  . carvedilol  3.125 mg Oral BID WC  . cholecalciferol  1,000 Units Oral Daily  . colchicine  0.6 mg Oral BID  . docusate sodium  100 mg Oral BID  . enoxaparin (LOVENOX) injection  40 mg Subcutaneous Q24H  . ferrous sulfate  325 mg Oral BID WC  . folic acid  1 mg Oral Daily  . magnesium oxide  200 mg Oral BID  . naproxen sodium  550 mg Oral BID WC  . pantoprazole  40 mg Oral Daily  . sodium chloride  3 mL Intravenous Q12H  . tamsulosin  0.4 mg Oral Daily  . thiamine  100 mg Oral Daily  . vitamin B-12  250 mcg Oral Daily  . vitamin C  500 mg Oral BID   Continuous Infusions:

## 2015-07-07 NOTE — Consult Note (Signed)
   [facility] Inpatient Consult   07/07/2015  Francisco [name] [birth year] [id number] Patient evaluated for community based chronic disease management services with [place] [facility] as a benefit of patient's [facility]. Spoke with patient at bedside to explain [place] [facility] services. Patient states he is interested in services but wanted to talk it over with his sister Francisco [name]. He states that his sister and her husband are right now working on getting things in place for his place for when he leaves the rehab center. He states that there is a lady at his place that needs to be gotten out, first.   He states he prefers his sister to look over the information regarding [place] [facility]. Left contact information and [facility] literature at bedside. Made Inpatient Case Manager aware of the referral.  Of note, [facility] Care Management services does not replace or interfere with any services that are arranged by inpatient case management or social work. Will follow up with patient's sister, Francisco at [phone], tomorrow, if possible.   For additional questions or referrals please contact:   Natividad Brood, RN BSN [facility] Liaison  [phone] business mobile phone

## 2015-07-07 NOTE — Progress Notes (Signed)
Chaplain responded to Code Blue of patient who was being accessed by the medical staff at time of arrival to the Unit. There was no family present at this time, but the Head Nurse phoned the family to provide them a medical update on the patient. There was no immediate need for a Chaplain at this time but a follow up spiritual support visit will be done at a later time. Chaplain Yaakov Guthrie [phone]

## 2015-07-07 NOTE — Clinical Social Work Note (Signed)
CSW spoke with patient's sister Lavera Guise [phone] and she would like patient to go to [facility] for short term rehab.  CSW contacted [facility] and they confirmed they can take patient tomorrow if he is medically ready and discharge orders have been received.  CSW will continue to follow patient's progress throughout discharge planning.  Jones Broom. Fisher, MSW, [place] 07/07/2015 4:43 PM

## 2015-07-07 NOTE — Progress Notes (Signed)
RN was called into room because tech found something that patient had spat up (looked like a medium size tea bag)- RN asked patient what it was and he stated, "it's tobacco, it was in my mouth the whole time." RN believes that while patient was having the seizure, he choked on the bag. Will write note for MD Vega.

## 2015-07-07 NOTE — Progress Notes (Signed)
RN asked tech to get vitals on patient because his b/p was high- tech called RN asking her to come into the room. When RN got into room- patient was shaking; RN asked patient if he was ok and he replied that he was just fine. Few seconds later- patient was in a full tonic-clonic seizure in the chair. Patient's face turned completely red as he had drainage from the mouth. Patient then turned completely pale and stopped breathing. RN checked for a pulse-absent. CPR was started. Patient was in the chair at the time- code was activated. Patient was placed on bed where CPR continued. Help arrived and NS was hung, IV placed, suction hooked. Patient finally came to after about 3 minutes of CPR. Patient was very confused but was able to squeeze with both hands when asked. MD arrived. Orders placed.

## 2015-07-07 NOTE — Progress Notes (Signed)
Pt transfered to 2C-07. Pt. Is alert and oriented. Pt is hemodynamically stable- b/p elevated (PRN hydralazine ordered). 2 IV's in place. Non-rebreather on. NS open wide.

## 2015-07-07 NOTE — Clinical Social Work Note (Signed)
Clinical Social Work Assessment  Patient Details  Name: Francisco [name] MRN: [id number] Date of Birth: [birth year]  Date of referral:  07/06/15               Reason for consult:  Facility Placement                Permission sought to share information with:  Family Supports Permission granted to share information::  Yes, Verbal Permission Granted  Name::     Patient's sister Nurse, learning disability::  SNF admissions  Relationship::     Contact Information:     Housing/Transportation Living arrangements for the past 2 months:  [place] of Information:  Patient, Other (Comment Required) (Patient's sister Francisco [name]) Patient Interpreter Needed:  None Criminal Activity/Legal Involvement Pertinent to Current Situation/Hospitalization:  No - Comment as needed Significant Relationships:  Other Family Members Lives with:  Self Do you feel safe going back to the place where you live?  No (Patient feels he needs some short term rehab at a SNF before he can return back home) Need for family participation in patient care:  Yes (Comment) (Patient requests that his sister be involved in discharge planning and snf selection.)  Care giving concerns:  Patient and his sister feel like he needs short term rehab in order to return back home.   Social Worker assessment / plan:  CSW met with patient to discuss SNF placement.  Patient is alert and oriented x4 and pleasant to talk to.  Patient lives with a roommate but will be living on his own once he has completed his therapy.  Patient has history of alcohol abuse, and having seizures related to alcohol.  Patient expressed that he has never been to rehab before, CSW explained process to him and how insurance pays [facility] stay at [facility].  Patient requested that CSW speak to his sister Francisco [name] about SNF placement options, because he would like her to make decisions for him.  Patient stated he is feeling better today and was glad he was able to do some walking with a  nurse tech.  Patient expressed that he does not feel like he is ready to return back home alone yet, because he is still very weak.  PT saw patient and recommended that he would be a good candidate for SNF placement.  CSW talked to patient and his sister about the process of SNF placement and she agrees he would benefit from some short term rehab.  CSW inquired if patient or his sister had any more questions and neither of them felt like they currently have any other questions.  CSW gave patient and his sister contact information, and faxed patient's clinicals to SNFs in [place] and surrounding counties.  Employment status:  Retired Forensic scientist:  Medicaid [place], [employer] PT Recommendations:  [place] / Referral to community resources:  [place]  Patient/Family's Response to care:  Patient and his sister are in agreement to going to SNF for short term rehab and would prefer [facility] if possible.  Patient/Family's Understanding of and Emotional Response to Diagnosis, Current Treatment, and Prognosis:  Patient and his sister seem to be understanding of patient's prognosis and current treatment plan.  Emotional Assessment Appearance:  Appears stated age Attitude/Demeanor/Rapport:    Affect (typically observed):  Appropriate, Pleasant, Calm Orientation:  Oriented to Self, Oriented to Place, Oriented to  Time, Oriented to Situation Alcohol / Substance use:  Not Applicable Psych involvement (Current  and /or in the community):  No (Comment)  Discharge Needs  Concerns to be addressed:  No discharge needs identified Readmission within the last 30 days:  No Current discharge risk:  None Barriers to Discharge:  No Barriers Identified   Ross Ludwig, LCSWA 07/07/2015, 7:22 PM

## 2015-07-07 NOTE — Progress Notes (Signed)
RN went into room and was speaking with patient- he was able to answer all of the questions asked and even stated his name.

## 2015-07-08 ENCOUNTER — Other Ambulatory Visit: Payer: Self-pay

## 2015-07-08 ENCOUNTER — Encounter (HOSPITAL_COMMUNITY): Payer: Self-pay | Admitting: Cardiovascular Disease

## 2015-07-08 ENCOUNTER — Inpatient Hospital Stay (HOSPITAL_COMMUNITY): Payer: Commercial Managed Care - HMO

## 2015-07-08 ENCOUNTER — Encounter (HOSPITAL_COMMUNITY)
Admission: EM | Disposition: A | Discharge status: Skilled Nursing Facility | Payer: Commercial Managed Care - HMO | Source: Home / Self Care | Attending: Family Medicine

## 2015-07-08 DIAGNOSIS — I469 Cardiac arrest, cause unspecified: Secondary | ICD-10-CM | POA: Insufficient documentation

## 2015-07-08 DIAGNOSIS — R569 Unspecified convulsions: Secondary | ICD-10-CM | POA: Insufficient documentation

## 2015-07-08 DIAGNOSIS — I442 Atrioventricular block, complete: Secondary | ICD-10-CM | POA: Insufficient documentation

## 2015-07-08 DIAGNOSIS — F10230 Alcohol dependence with withdrawal, uncomplicated: Secondary | ICD-10-CM

## 2015-07-08 DIAGNOSIS — I421 Obstructive hypertrophic cardiomyopathy: Secondary | ICD-10-CM

## 2015-07-08 DIAGNOSIS — I9589 Other hypotension: Secondary | ICD-10-CM

## 2015-07-08 HISTORY — PX: CARDIAC CATHETERIZATION: SHX172

## 2015-07-08 LAB — TROPONIN I
Troponin I: 0.07 ng/mL — ABNORMAL HIGH (ref <0.031)
Troponin I: 0.12 ng/mL — ABNORMAL HIGH (ref <0.031)
Troponin I: 0.15 ng/mL — ABNORMAL HIGH (ref <0.031)

## 2015-07-08 LAB — MRSA PCR SCREENING: MRSA by PCR: NEGATIVE

## 2015-07-08 LAB — CBC WITH DIFFERENTIAL/PLATELET
Basophils Absolute: 0 10*3/uL (ref 0.0–0.1)
Basophils Relative: 0 % (ref 0–1)
Eosinophils Absolute: 0 10*3/uL (ref 0.0–0.7)
Eosinophils Relative: 0 % (ref 0–5)
HCT: 38.9 % — ABNORMAL LOW (ref 39.0–52.0)
Hemoglobin: 13 g/dL (ref 13.0–17.0)
Lymphocytes Relative: 4 % — ABNORMAL LOW (ref 12–46)
Lymphs Abs: 0.6 10*3/uL — ABNORMAL LOW (ref 0.7–4.0)
MCH: 29.1 pg (ref 26.0–34.0)
MCHC: 33.4 g/dL (ref 30.0–36.0)
MCV: 87 fL (ref 78.0–100.0)
Monocytes Absolute: 0.7 10*3/uL (ref 0.1–1.0)
Monocytes Relative: 5 % (ref 3–12)
Neutro Abs: 12.3 10*3/uL — ABNORMAL HIGH (ref 1.7–7.7)
Neutrophils Relative %: 91 % — ABNORMAL HIGH (ref 43–77)
Platelets: 385 10*3/uL (ref 150–400)
RBC: 4.47 MIL/uL (ref 4.22–5.81)
RDW: 14.2 % (ref 11.5–15.5)
WBC: 13.5 10*3/uL — ABNORMAL HIGH (ref 4.0–10.5)

## 2015-07-08 LAB — BASIC METABOLIC PANEL
Anion gap: 11 (ref 5–15)
Anion gap: 7 (ref 5–15)
BUN: 12 mg/dL (ref 6–20)
BUN: 15 mg/dL (ref 6–20)
CO2: 24 mmol/L (ref 22–32)
CO2: 25 mmol/L (ref 22–32)
Calcium: 8.7 mg/dL — ABNORMAL LOW (ref 8.9–10.3)
Calcium: 8.4 mg/dL — ABNORMAL LOW (ref 8.9–10.3)
Chloride: 94 mmol/L — ABNORMAL LOW (ref 101–111)
Chloride: 99 mmol/L — ABNORMAL LOW (ref 101–111)
Creatinine, Ser: 0.93 mg/dL (ref 0.61–1.24)
Creatinine, Ser: 1.05 mg/dL (ref 0.61–1.24)
GFR calc Af Amer: >60 mL/min (ref >60)
GFR calc Af Amer: >60 mL/min (ref >60)
GFR calc non Af Amer: >60 mL/min (ref >60)
GFR calc non Af Amer: >60 mL/min (ref >60)
Glucose, Bld: 120 mg/dL — ABNORMAL HIGH (ref 65–99)
Glucose, Bld: 110 mg/dL — ABNORMAL HIGH (ref 65–99)
Potassium: 3.7 mmol/L (ref 3.5–5.1)
Potassium: 3.9 mmol/L (ref 3.5–5.1)
Sodium: 129 mmol/L — ABNORMAL LOW (ref 135–145)
Sodium: 131 mmol/L — ABNORMAL LOW (ref 135–145)

## 2015-07-08 LAB — POCT I-STAT, CHEM 8
BUN: 11 mg/dL (ref 6–20)
Calcium, Ion: 1.02 mmol/L — ABNORMAL LOW (ref 1.13–1.30)
Chloride: 101 mmol/L (ref 101–111)
Creatinine, Ser: 0.9 mg/dL (ref 0.61–1.24)
Glucose, Bld: 153 mg/dL — ABNORMAL HIGH (ref 65–99)
HCT: 32 % — ABNORMAL LOW (ref 39.0–52.0)
Hemoglobin: 10.9 g/dL — ABNORMAL LOW (ref 13.0–17.0)
Potassium: 2.8 mmol/L — ABNORMAL LOW (ref 3.5–5.1)
Sodium: 122 mmol/L — ABNORMAL LOW (ref 135–145)
TCO2: 20 mmol/L (ref 0–100)

## 2015-07-08 LAB — PROTIME-INR
INR: 1.01 (ref 0.00–1.49)
Prothrombin Time: 13.5 seconds (ref 11.6–15.2)

## 2015-07-08 LAB — MAGNESIUM
Magnesium: 1.6 mg/dL — ABNORMAL LOW (ref 1.7–2.4)
Magnesium: 2 mg/dL (ref 1.7–2.4)

## 2015-07-08 LAB — APTT: aPTT: 31 seconds (ref 24–37)

## 2015-07-08 LAB — LACTIC ACID, PLASMA: Lactic Acid, Venous: 1.5 mmol/L (ref 0.5–2.0)

## 2015-07-08 SURGERY — LEFT HEART CATH AND CORONARY ANGIOGRAPHY
Anesthesia: LOCAL

## 2015-07-08 MED ORDER — ATROPINE SULFATE 0.1 MG/ML IJ SOLN
INTRAMUSCULAR | Status: AC
Start: 2015-07-08 — End: 2015-07-08
  Administered 2015-07-08: 1 mg
  Filled 2015-07-08: qty 10

## 2015-07-08 MED ORDER — FUROSEMIDE 10 MG/ML IJ SOLN
INTRAMUSCULAR | Status: AC
  Filled 2015-07-08: qty 4

## 2015-07-08 MED ORDER — LORAZEPAM 2 MG/ML IJ SOLN: 2.0000 mg | INTRAMUSCULAR | Status: DC | PRN

## 2015-07-08 MED ORDER — MAGNESIUM SULFATE 2 GM/50ML IV SOLN
2.0000 g | Freq: Once | INTRAVENOUS | Status: AC
  Administered 2015-07-08: 2 g via INTRAVENOUS

## 2015-07-08 MED ORDER — ACETAMINOPHEN 325 MG PO TABS
650.0000 mg | ORAL_TABLET | Freq: Four times a day (QID) | ORAL | Status: DC | PRN
  Administered 2015-07-09: 650 mg via ORAL
  Filled 2015-07-08: qty 2

## 2015-07-08 MED ORDER — PROMETHAZINE HCL 25 MG/ML IJ SOLN
12.5000 mg | Freq: Four times a day (QID) | INTRAMUSCULAR | Status: DC | PRN
  Administered 2015-07-08: 12.5 mg via INTRAVENOUS
  Filled 2015-07-08: qty 1

## 2015-07-08 MED ORDER — POTASSIUM CHLORIDE 10 MEQ/100ML IV SOLN
10.0000 meq | INTRAVENOUS | Status: AC
  Administered 2015-07-08 (×2): 10 meq via INTRAVENOUS
  Filled 2015-07-08 (×2): qty 100

## 2015-07-08 MED ORDER — AMIODARONE HCL IN DEXTROSE 360-4.14 MG/200ML-% IV SOLN
INTRAVENOUS | Status: AC
  Filled 2015-07-08: qty 200

## 2015-07-08 MED ORDER — DOPAMINE-DEXTROSE 3.2-5 MG/ML-% IV SOLN
INTRAVENOUS | Status: AC
  Filled 2015-07-08: qty 250

## 2015-07-08 MED ORDER — ATROPINE SULFATE 0.1 MG/ML IJ SOLN
INTRAMUSCULAR | Status: AC
  Administered 2015-07-08: 1 mg
  Filled 2015-07-08: qty 10

## 2015-07-08 MED ORDER — NAPROXEN 250 MG PO TABS
500.0000 mg | ORAL_TABLET | Freq: Two times a day (BID) | ORAL | Status: DC
  Administered 2015-07-08: 500 mg via ORAL
  Filled 2015-07-08 (×3): qty 2

## 2015-07-08 MED ORDER — LORAZEPAM 2 MG/ML IJ SOLN
INTRAMUSCULAR | Status: AC
  Filled 2015-07-08: qty 1

## 2015-07-08 MED ORDER — ACETAMINOPHEN 650 MG RE SUPP: 325.0000 mg | Freq: Four times a day (QID) | RECTAL | Status: DC | PRN

## 2015-07-08 MED ORDER — LEVETIRACETAM 500 MG/5ML IV SOLN
1500.0000 mg | INTRAVENOUS | Status: AC
  Administered 2015-07-08: 1500 mg via INTRAVENOUS
  Filled 2015-07-08: qty 15

## 2015-07-08 MED ORDER — MAGNESIUM SULFATE 2 GM/50ML IV SOLN
INTRAVENOUS | Status: AC
  Administered 2015-07-08: 2 g
  Filled 2015-07-08: qty 50

## 2015-07-08 MED ORDER — HEPARIN (PORCINE) IN NACL 2-0.9 UNIT/ML-% IJ SOLN
INTRAMUSCULAR | Status: AC
  Filled 2015-07-08: qty 1000

## 2015-07-08 MED ORDER — FUROSEMIDE 10 MG/ML IJ SOLN
INTRAMUSCULAR | Status: DC | PRN
  Administered 2015-07-08: 40 mg via INTRAVENOUS

## 2015-07-08 MED ORDER — EPINEPHRINE HCL 1 MG/ML IJ SOLN
0.5000 ug/min | INTRAMUSCULAR | Status: DC
  Administered 2015-07-08: 5 ug/min via INTRAVENOUS
  Filled 2015-07-08: qty 4

## 2015-07-08 MED ORDER — COLCHICINE 0.6 MG PO TABS
0.6000 mg | ORAL_TABLET | Freq: Every day | ORAL | Status: DC
  Administered 2015-07-09 – 2015-07-13 (×5): 0.6 mg via ORAL
  Filled 2015-07-08 (×5): qty 1

## 2015-07-08 MED ORDER — LEVETIRACETAM 500 MG PO TABS
750.0000 mg | ORAL_TABLET | Freq: Two times a day (BID) | ORAL | Status: DC
  Administered 2015-07-08 – 2015-07-13 (×10): 750 mg via ORAL
  Filled 2015-07-08 (×16): qty 1

## 2015-07-08 MED ORDER — LIDOCAINE HCL (PF) 1 % IJ SOLN
INTRAMUSCULAR | Status: AC
  Filled 2015-07-08: qty 30

## 2015-07-08 MED ORDER — MAGNESIUM SULFATE 2 GM/50ML IV SOLN
INTRAVENOUS | Status: AC
  Filled 2015-07-08: qty 50

## 2015-07-08 MED ORDER — DOPAMINE-DEXTROSE 3.2-5 MG/ML-% IV SOLN
0.0000 ug/kg/min | INTRAVENOUS | Status: DC
Start: 2015-07-08 — End: 2015-07-10
  Administered 2015-07-08: 5 ug/kg/min via INTRAVENOUS

## 2015-07-08 MED FILL — Medication: Qty: 1 | Status: AC

## 2015-07-08 SURGICAL SUPPLY — 9 items
CATH INFINITI 5FR JL5 (CATHETERS) ×2 IMPLANT
CATH INFINITI 5FR MULTPACK ANG (CATHETERS) ×2 IMPLANT
CATH S G BIP PACING (SET/KITS/TRAYS/PACK) ×2 IMPLANT
KIT HEART LEFT (KITS) ×3 IMPLANT
PACK CARDIAC CATHETERIZATION (CUSTOM PROCEDURE TRAY) ×3 IMPLANT
SHEATH PINNACLE 6F 10CM (SHEATH) ×4 IMPLANT
SYR MEDRAD MARK V 150ML (SYRINGE) ×3 IMPLANT
TRANSDUCER W/STOPCOCK (MISCELLANEOUS) ×3 IMPLANT
WIRE EMERALD 3MM-J .035X150CM (WIRE) ×2 IMPLANT

## 2015-07-08 NOTE — Procedures (Signed)
ELECTROENCEPHALOGRAM REPORT   Patient: Francisco [name]       Room #: 7O47 EEG No. ID: [id number] Age: 69 y.o.        Sex: male Referring Physician: Wendee Beavers Report Date:  07/08/2015        Interpreting Physician: Alexis Goodell  History: Francisco [name] is an 69 y.o. male admitted after sustaining a witnessed seizure at home  Medications:  Scheduled: . atorvastatin  40 mg Oral q1800  . cholecalciferol  1,000 Units Oral Daily  . [START ON 07/09/2015] colchicine  0.6 mg Oral Daily  . docusate sodium  100 mg Oral BID  . enoxaparin (LOVENOX) injection  40 mg Subcutaneous Q24H  . ferrous sulfate  325 mg Oral BID WC  . folic acid  1 mg Oral Daily  . levETIRAcetam  750 mg Oral BID  . magnesium oxide  200 mg Oral BID  . pantoprazole  40 mg Oral Daily  . sodium chloride  3 mL Intravenous Q12H  . tamsulosin  0.4 mg Oral Daily  . thiamine  100 mg Oral Daily  . vitamin B-12  250 mcg Oral Daily  . vitamin C  500 mg Oral BID    Conditions of Recording:  This is a 16 channel EEG carried out with the patient in the awake and drowsy states.  Description:  The waking background activity consists of a low voltage, symmetrical, fairly well organized, 9 Hz alpha activity, seen from the parieto-occipital and posterior temporal regions.  Low voltage fast activity, poorly organized, is seen anteriorly and is at times superimposed on more posterior regions.  A mixture of theta and alpha rhythms are seen from the central and temporal regions. The patient drowses with slowing to irregular, low voltage theta and beta activity.   Stage II sleep is not obtained. No epileptiform is noted.   Hyperventilation and intermittent photic stimulation were not performed.   IMPRESSION: Normal electroencephalogram, awake, drowsy. There are no focal lateralizing or epileptiform features.   Alexis Goodell, MD [facility] [phone] 07/08/2015, 5:54 PM

## 2015-07-08 NOTE — Code Documentation (Signed)
  Patient Name: Francisco [name]   MRN: [id number]   Date of Birth/ Sex: [birth year] , male      Admission Date: 06/29/2015  Attending Provider: Velvet Bathe, MD  Primary Diagnosis: Alcohol withdrawal seizure   Indication: Pt was in his usual state of health until this AM, when he was noted to be asystolic. Code blue was subsequently called. At the time of arrival on scene, ACLS protocol was underway.   Technical Description:  - CPR performance duration:  5  minutes  - Was defibrillation or cardioversion used? No   - Was external pacer placed? Yes  - Was patient intubated pre/post CPR? Yes   Medications Administered: Y = Yes; Blank = No Amiodarone    Atropine    Calcium    Epinephrine  1  Lidocaine    Magnesium    Norepinephrine    Phenylephrine    Sodium bicarbonate    Vasopressin     Post CPR evaluation:  - Final Status - Was patient successfully resuscitated ? Yes - What is current rhythm? afib - What is current hemodynamic status? stable  Miscellaneous Information:  - Labs sent, including: none  - Primary team notified?  Yes  - Family Notified? No  - Additional notes/ transfer status: Being taken to cath lab     Iline Oven, MD  07/08/2015, 4:46 AM

## 2015-07-08 NOTE — Progress Notes (Signed)
Pt became bradycardic and was having pauses on his telemetry. Pt HR was in the low 30s. RN called rapid response and notified TRH. EKG was completed. Pt subsequently had a seizure and lost his pulse. A code was called and compressions started. Pt was transferred to 2H03.

## 2015-07-08 NOTE — Care Management Important Message (Signed)
Important Message  Patient Details  Name: Francisco [name] MRN: [id number] Date of Birth: July 04, 1946   Medicare Important Message Given:  Yes-fourth notification given    Pricilla Handler 07/08/2015, 11:03 AM

## 2015-07-08 NOTE — Clinical Social Work Note (Signed)
Patient has transferred from 6N08 to 2H03, hand off given to unit CSW.  This CSW to sign off, [facility] was given updated social worker information.  Jones Broom. [place], MSW, [place] 07/08/2015 10:09 AM

## 2015-07-08 NOTE — Consult Note (Signed)
Referring Physician: Dr. 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 Pt apparently had no recollection of seizure activity but per EMS report, pt appeared post ictal upon their arrival.  Per report from Hospitalist team, he had another seizure earlier today with hypoxic respiratory failure requiring a couple of minutes of CPR prior to ROSC.  Cardiology was called today because of bradycardia and block noted on ECG.  Review of ECG reveals atrial rate of ~ 100 bpm with ventricular rate of ~ 30 bpm.  QRS is rather similar to baseline RBBB suggesting possible high grade block (CHB) with low junctional escape in addition to deep, inverted TWI in anterior leads concerning for wellens sign.  However, patient had seizure today and denies any chest pain making TWI more likely to reflect neurologic process rather than cardiac ischemia.  Patient also noted to be hypotensive with above rhythm to SBP of 70s.  Advised to attempt atropine +/- dopamine in attempt to increase conduction.  On arrival to room, patient had received 2 of atropine without improvement in ventricular rate.  Dopamine begun due to ongoing hypotension without improvement in ventricular rate.  Had received just under 1/2 L NS and advised to continue aggressive IVF given previously noted HOCM with increase in gradient.  Given no response in V rate and h/o HOCM decided to reduce and stop dopamine in order to avoid increased inotropy and possible increase in obstruction/outflow gradient.  While speaking with Dr. Claiborne Billings for possible temporary pacer, patient had another seizure  with arrest.  CPR initiated and 1 of epi given.  This resulted in VT with return in mentation but still hypotensive.  1 synchronized shock given, unsuccessful but as amiodarone was being prepped VT resolved.  Patient was again awake, alert and mentation back to baseline.  AF with V rate in 60s ensued followed by return of SR with V rate ~ 50 prior to transferring to [facility] CCU.  Discussed possible need for temporary pacer with patient if bradycardia ensued again and he is agreeable.    Review of Systems:     Cardiac Review of Systems: {Y] = yes [ ]  = no  Chest Pain [    ]  Resting SOB [   ] Exertional SOB  [  ]  Orthopnea [  ]   Pedal Edema [   ]    Palpitations [  ] Syncope  [  ]   Presyncope [   ]  General Review of Systems: [Y] = yes [  ]=no Constitional: recent weight change [  ]; anorexia [  ]; fatigue [  ]; nausea [  ]; night sweats [  ]; fever [  ]; or chills [  ];                                                                     Eyes : blurred vision [  ]; diplopia [   ];  vision changes [  ];  Amaurosis fugax[  ]; Resp: cough [  ];  wheezing[  ];  hemoptysis[  ];  PND [  ];  GI:  gallstones[  ], vomiting[  ];  dysphagia[  ]; melena[  ];  hematochezia [  ]; heartburn[  ];   GU: kidney stones [  ]; hematuria[  ];   dysuria [  ];  nocturia[  ]; incontinence [  ];             Skin: rash, swelling[  ];, hair loss[  ];  peripheral edema[  ];  or itching[  ]; Musculosketetal: myalgias[  ];  joint swelling[  ];  joint erythema[  ];  joint pain[  ];  back pain[  ];  Heme/Lymph: bruising[  ];  bleeding[  ];  anemia[  ];  Neuro: TIA[  ];  headaches[  ];  stroke[  ];  vertigo[  ];  seizures[x  ];   paresthesias[  ];  difficulty walking[  ];  Psych:depression[  ]; anxiety[  ];  Endocrine: diabetes[  ];  thyroid dysfunction[  ];  Other:  Past Medical History  Diagnosis Date  . Celiac disease   . Hypertension   . Heart murmur   . Shortness of breath   . GERD (gastroesophageal reflux disease)   .  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 Anemia   . Multiple duodenal ulcers     Medications Prior to Admission  Medication Sig Dispense Refill  . amLODipine (NORVASC) 5 MG tablet Take 1 tablet (5 mg total) by mouth daily. 30 tablet 0  . Cholecalciferol (VITAMIN D3) 1000 UNITS CAPS Take 1 capsule (1,000 Units total) by mouth daily. 30 capsule 11  . cyclobenzaprine (FLEXERIL) 10 MG tablet Take 10 mg by mouth every 8 (eight) hours as needed. For muscle spasm.    . furosemide (LASIX) 20 MG tablet Take 1 tablet (20 mg total) by mouth daily. 30 tablet 0  . lisinopril-hydrochlorothiazide (PRINZIDE,ZESTORETIC) 20-25 MG per tablet TAKE 1 TABLET BY MOUTH EVERY DAY 30 tablet 0  . pantoprazole (PROTONIX) 40 MG tablet TAKE 1 TABLET BY MOUTH TWICE DAILY BEFORE A MEAL 60 tablet 0  . pravastatin (PRAVACHOL) 40 MG tablet TAKE 1 TABLET BY MOUTH EVERY EVENING 90 tablet 0  . tamsulosin (FLOMAX) 0.4 MG CAPS capsule Take 1 capsule (0.4 mg total) by mouth daily. 90 capsule 3  . potassium chloride (KLOR-CON 10) 10 MEQ tablet Take 1 tablet (10 mEq total) by mouth daily. (Patient not taking: Reported on 06/29/2015) 30 tablet 0     . amiodarone      . atorvastatin  40 mg Oral q1800  . cholecalciferol  1,000 Units Oral Daily  . colchicine  0.6 mg Oral BID  . docusate sodium  100 mg Oral BID  . DOPamine      . enoxaparin (LOVENOX) injection  40 mg Subcutaneous Q24H  . ferrous sulfate  325 mg Oral BID WC  . folic acid  1 mg Oral Daily  . magnesium oxide  200 mg Oral BID  . naproxen sodium  550 mg Oral BID WC  . pantoprazole  40 mg Oral Daily  . sodium chloride  3 mL Intravenous Q12H  . tamsulosin  0.4 mg Oral Daily  . thiamine  100 mg Oral Daily  . vitamin B-12  250 mcg Oral Daily  . vitamin C  500 mg Oral BID    Infusions: . DOPamine  Allergies  Allergen Reactions  . Gluten Meal Other (See Comments)    Celiac Disease    Social History   Social History  . Marital  Status: Married    Spouse Name: N/A  . Number of Children: N/A  . Years of Education: N/A   Occupational History  . Not on file.   Social History Main Topics  . Smoking status: Former Smoker -- 0.50 packs/day for 50 years    Types: Cigarettes  . Smokeless tobacco: Former Systems developer  . Alcohol Use: 0.0 oz/week    1-2 Glasses of wine per week     Comment: ocassional  . Drug Use: No  . Sexual Activity: No   Other Topics Concern  . Not on file   Social History Narrative   Lives alone. --as of 11/2013   Smokes e-cig. Has nicotine in it. No real cigarette any more    Family History  Problem Relation Age of Onset  . Hodgkin's lymphoma Father     PHYSICAL EXAM: Filed Vitals:   07/08/15 0253  BP: 62/46  Pulse: 62  Temp:   Resp: 16     Intake/Output Summary (Last 24 hours) at 07/08/15 0323 Last data filed at 07/08/15 0140  Gross per 24 hour  Intake   1043 ml  Output   1775 ml  Net   -732 ml    General:  Well appearing. No respiratory difficulty HEENT: normal Neck: supple. no JVD. Carotids 2+ bilat; no bruits. No lymphadenopathy or thryomegaly appreciated. Cor: PMI nondisplaced. Regular rate & rhythm. No rubs, gallops or murmurs. Lungs: clear Abdomen: soft, nontender, nondistended. No hepatosplenomegaly. No bruits or masses. Good bowel sounds. Extremities: no cyanosis, clubbing, rash, edema Neuro: alert & oriented x 3, cranial nerves grossly intact. moves all 4 extremities w/o difficulty. Affect pleasant.  ECG: see above  Results for orders placed or performed during the hospital encounter of 06/29/15 (from the past 24 hour(s))  Magnesium     Status: Abnormal   Collection Time: 07/07/15  6:20 PM  Result Value Ref Range   Magnesium 1.6 (L) 1.7 - 2.4 mg/dL   No results found.   ASSESSMENT: 69 yo man with complex history noted above including alcoholism, seizure and possible HOCM with high grade heart block and slow escape rhythm and transient arrest in setting of  seizure.  PLAN/DISCUSSION: Transfer to [place] with IVF and tele monitoring If he becomes bradycardic again will need temporary pacing wire, he is agreeable Labs ordered per primary team Avoid AVN blocking agents at this time If need to temporize in setting of bradycardia, consider atropine, temporary pacing and neo if further pressure support needed (would want to avoid agents with positive inotropic properties given HOCM noted on echo last year).

## 2015-07-08 NOTE — Progress Notes (Addendum)
RN, Sharyn Lull, paged this NP secondary to pt's HR dipping into the 30s. Not sustaining. RRRN was called to bedside. 12 lead EKG captured ? 2nd degree AV block which seems to be a new finding (reviewed notes and couldn't find a hx of HB). Writer spoke to [employer]. Pt's HR presently in the 50s with dips into the 30s. Pt is alert, talking, and not complaining of CP, dizziness, or SOB. Hold Coreg. Check BMP, Mg, trops x 3. TSH was normal one week ago. Atropine and temporary pacer wires ordered to bedside.  Cardiology consult called to Dr. Philbert Riser. NP to bedside. Dr. Philbert Riser arrived simultaneously.  +++At bedside, pt is on NRB but hasn't dropped his O2 sats nor is he working hard to breathe. Atropine for total of 81m was given. He has nausea with hypotension. No CP and "on and off" SOB. He is mentating well. See Dr. AMadison Hickmannote re: rhythm and treatment plan. Dopamine at 517m was started and titrated to 10 with improvement in his BP but had no effect on conduction.  However, minutes after the above, pt seized and cardiac arrested. CPR was only performed for about 3 minutes. Epi x 1 given with SROC. Respiratory support with ambu bag during incident. One sync'd shock was given at 120 joules.  Pt immediately woke up and was mentating and supporting airway. No other meds given during ACLS.  [facility] AlPhilbert Risers directing care/orders at bedside. Further meds given per his orders.  Labs: Troponin 0.7 (cards aware), Mg 1.6 (2 gms given), and K 3.7 (2 runs ordered).  Plan at this time is to place a temporary pacemaker.  Pt gave wrProbation officerermission to call his sister and update her on events.  +++see cards consult for specifics of heart issues and treatment plan.  KaClance BollNP [facility] Update: It was decided that pt would go to cath lab for temp pacer, but prior to transfer, pt went unresponsive and lost pulse. Again, code blue was called and ACLS immediately started.  Respiratory support with ambu bag. After EPI x 1 and a few minutes of CPR, pt with SROC. Pt is on EPI drip per cardiology. Mentating at baseline after this event and was transferred to cath lab under the care of Drs AlPhilbert Risernd KeClaiborne Billings Writer spoke to pt's sister, DeLisbeth Plyand updated her on events tonight and seriousness of his illness. She will come to the hospital.  [facility] Hospital[facility]

## 2015-07-08 NOTE — Progress Notes (Signed)
PT Cancellation Note  Patient Details Name: Francisco [name] MRN: [id number] DOB: July 25, 1946   Cancelled Treatment:    Reason Eval/Treat Not Completed: Medical issues which prohibited therapy (noted events of this am. Will hold at this time and check next date for medical stability)   Lanetta Inch Beth 07/08/2015, 7:25 AM Elwyn Reach, [place]

## 2015-07-08 NOTE — Consult Note (Addendum)
NEURO HOSPITALIST CONSULT NOTE   Referring physician: Dr Wendee Beavers Reason for Consult: seizures  HPI:                                                                                                                                          Francisco [name] is an 69 y.o. 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 Patient lives with a roommate who found him on the floor having generalized jerking movements and EMS was called. As per chart review, EMS found him post ictal for 10-15 minutes. Evaluation in the ED was unimpressive, with ETOH level <5 and CT brain that I personally reviewed and showed no acute abnormality. Further, patient has no recollection of his seizure and family report no prior spells concerning for seizures. He just got a temporary pacemaker inserted.  He and his family denied prior seizures but according to his medical record he had a similar episode 1 year ago. His hospital course has been complicated by recurrent generalized tonic-clonic seizures in which he had respiratory arrest and severe bradycardia, hypotension, evolving into cardiac arrest requiring CPR/ACLS protocol. On further questioning, he denies history of febrile seizures, CNS infection, severe head trauma, or stroke. Born full term, normal development. No family history of epilepsy. Denies HA, vertigo, double vision, focal weakness, slurred speech, language or vision impairment.  Past Medical History  Diagnosis Date  . Celiac disease   . Hypertension   . Heart murmur   . Shortness of breath   . GERD (gastroesophageal reflux disease)   . Transfusion (red blood cell) associated hemochromatosis  06/13/2012  . Anemia   . Multiple duodenal ulcers     Past Surgical History  Procedure Laterality Date  . Hemorrhoid surgery    . Mole removal    . Hernia repair    . Esophagogastroduodenoscopy  07/03/2012    Procedure: ESOPHAGOGASTRODUODENOSCOPY (EGD);  Surgeon: Winfield Cunas., MD;  Location: [facility] ENDOSCOPY;  Service: Endoscopy;  Laterality: N/A;  . Cardiac catheterization N/A 07/08/2015    Procedure: Left Heart Cath and Coronary Angiography;  Surgeon: Troy Sine, MD;  Location: [facility];  Service: Cardiovascular;  Laterality: N/A;  . Cardiac catheterization N/A 07/08/2015    Procedure: Temporary Pacemaker;  Surgeon: Troy Sine, MD;  Location: [facility];  Service: Cardiovascular;  Laterality: N/A;    Family History  Problem Relation Age of Onset  . Hodgkin's lymphoma Father     Family History: no epilepsy, brain tumor, or brain aneurysm   Social  History:  reports that he has quit smoking. His smoking use included Cigarettes. He has a 25 pack-year smoking history. He has quit using smokeless tobacco. He reports that he drinks alcohol. He reports that he does not use illicit drugs.  Allergies  Allergen Reactions  . Gluten Meal Other (See Comments)    Celiac Disease    MEDICATIONS:                                                                                                                     Scheduled: . amiodarone      . atorvastatin  40 mg Oral q1800  . cholecalciferol  1,000 Units Oral Daily  . colchicine  0.6 mg Oral BID  . docusate sodium  100 mg Oral BID  . enoxaparin (LOVENOX) injection  40 mg Subcutaneous Q24H  . ferrous sulfate  325 mg Oral BID WC  . folic acid  1 mg Oral Daily  . magnesium oxide  200 mg Oral BID  . naproxen  500 mg Oral BID WC  . pantoprazole  40 mg Oral Daily  . sodium chloride  3 mL Intravenous Q12H  . tamsulosin  0.4 mg Oral Daily  . thiamine  100 mg Oral Daily  . vitamin B-12  250 mcg Oral Daily  . vitamin C   500 mg Oral BID     ROS:                                                                                                                                       History obtained from family, chart review and the patient  General ROS: negative for - chills, fatigue, fever, night sweats, or weight loss Psychological ROS: negative for - behavioral disorder, hallucinations, memory difficulties, mood swings or suicidal ideation Ophthalmic ROS: negative for - blurry vision, double vision, eye pain or loss of vision ENT ROS: negative for - epistaxis, nasal discharge, oral lesions, sore throat, tinnitus or vertigo Allergy and Immunology ROS: negative for - hives or itchy/watery eyes Hematological and Lymphatic ROS: negative for - bleeding problems, bruising or swollen lymph nodes Endocrine ROS: negative for - galactorrhea, hair pattern changes, polydipsia/polyuria or temperature intolerance Respiratory ROS: negative for - cough, hemoptysis, shortness of breath or wheezing Cardiovascular ROS: negative for - chest pain, or dyspnea on exertion Gastrointestinal ROS: negative for - abdominal pain, diarrhea, hematemesis, nausea/vomiting or stool incontinence Genito-Urinary ROS:  negative for - dysuria, hematuria, incontinence or urinary frequency/urgency Musculoskeletal ROS: negative for - joint swelling or muscular weakness Neurological ROS: as noted in HPI Dermatological ROS: negative for rash and skin lesion changes    Physical exam:  Constitutional: well developed, pleasant male in no apparent distress. Blood pressure 129/69, pulse 79, temperature 97.6 F (36.4 C), temperature source Oral, resp. rate 23, height 5' 9"  (1.753 m), weight 115 kg (253 lb 8.5 oz), SpO2 99 %. Eyes: no jaundice or exophthalmos.  Head: normocephalic. Neck: supple, no bruits, no JVD. Cardiac: no murmurs. Lungs: clear. Abdomen: soft, no tender, no mass. Extremities: no edema, clubbing, or cyanosis.  Skin: no  rash  Neurologic Examination:                                                                                                      General: Mental Status: Alert, oriented, thought content appropriate.  Speech fluent without evidence of aphasia.  Able to follow 3 step commands without difficulty. Cranial Nerves: II: Discs flat bilaterally; Visual fields grossly normal, pupils equal, round, reactive to light and accommodation III,IV, VI: ptosis not present, extra-ocular motions intact bilaterally V,VII: smile symmetric, facial light touch sensation normal bilaterally VIII: hearing normal bilaterally IX,X: uvula rises symmetrically XI: bilateral shoulder shrug XII: midline tongue extension without atrophy or fasciculations  Motor: Moves all limbs symmetrically Tone and bulk:normal tone throughout; no atrophy noted Sensory: Pinprick and light touch intact throughout, bilaterally Deep Tendon Reflexes:  1+ all over Plantars: Right: downgoing   Left: downgoing Cerebellar: normal finger-to-nose, heel-to-shin no tested Gait:  No tested due to multiple leads, seizure precautions    Lab Results  Component Value Date/Time   CHOL 201* 06/09/2015 10:14 AM    Results for orders placed or performed during the hospital encounter of 06/29/15 (from the past 48 hour(s))  Magnesium     Status: Abnormal   Collection Time: 07/07/15  6:20 PM  Result Value Ref Range   Magnesium 1.6 (L) 1.7 - 2.4 mg/dL  Magnesium     Status: Abnormal   Collection Time: 07/08/15  3:04 AM  Result Value Ref Range   Magnesium 1.6 (L) 1.7 - 2.4 mg/dL  Basic metabolic panel     Status: Abnormal   Collection Time: 07/08/15  3:04 AM  Result Value Ref Range   Sodium 129 (L) 135 - 145 mmol/L   Potassium 3.7 3.5 - 5.1 mmol/L   Chloride 94 (L) 101 - 111 mmol/L   CO2 24 22 - 32 mmol/L   Glucose, Bld 120 (H) 65 - 99 mg/dL   BUN 12 6 - 20 mg/dL   Creatinine, Ser 0.93 0.61 - 1.24 mg/dL   Calcium 8.7 (L) 8.9 - 10.3 mg/dL    GFR calc non Af Amer >60 >60 mL/min   GFR calc Af Amer >60 >60 mL/min    Comment: (NOTE) The eGFR has been calculated using the CKD EPI equation. This calculation has not been validated in all clinical situations. eGFR's persistently <60 mL/min signify possible Chronic Kidney Disease.    Anion gap  11 5 - 15  Troponin I (q 6hr x 3)     Status: Abnormal   Collection Time: 07/08/15  3:04 AM  Result Value Ref Range   Troponin I 0.07 (H) <0.031 ng/mL    Comment:        PERSISTENTLY INCREASED TROPONIN VALUES IN THE RANGE OF 0.04-0.49 ng/mL CAN BE SEEN IN:       -UNSTABLE ANGINA       -CONGESTIVE HEART FAILURE       -MYOCARDITIS       -CHEST TRAUMA       -ARRYHTHMIAS       -LATE PRESENTING MYOCARDIAL INFARCTION       -COPD   CLINICAL FOLLOW-UP RECOMMENDED.   MRSA PCR Screening     Status: None   Collection Time: 07/08/15  3:41 AM  Result Value Ref Range   MRSA by PCR NEGATIVE NEGATIVE    Comment:        The GeneXpert MRSA Assay (FDA approved for NASAL specimens only), is one component of a comprehensive MRSA colonization surveillance program. It is not intended to diagnose MRSA infection nor to guide or monitor treatment for MRSA infections.   Lactic acid, plasma     Status: None   Collection Time: 07/08/15  4:00 AM  Result Value Ref Range   Lactic Acid, Venous 1.5 0.5 - 2.0 mmol/L  Troponin I (q 6hr x 3)     Status: Abnormal   Collection Time: 07/08/15  7:37 AM  Result Value Ref Range   Troponin I 0.12 (H) <0.031 ng/mL    Comment:        PERSISTENTLY INCREASED TROPONIN VALUES IN THE RANGE OF 0.04-0.49 ng/mL CAN BE SEEN IN:       -UNSTABLE ANGINA       -CONGESTIVE HEART FAILURE       -MYOCARDITIS       -CHEST TRAUMA       -ARRYHTHMIAS       -LATE PRESENTING MYOCARDIAL INFARCTION       -COPD   CLINICAL FOLLOW-UP RECOMMENDED.   Protime-INR     Status: None   Collection Time: 07/08/15  7:37 AM  Result Value Ref Range   Prothrombin Time 13.5 11.6 - 15.2  seconds   INR 1.01 0.00 - 1.49  CBC with Differential/Platelet     Status: Abnormal   Collection Time: 07/08/15  7:37 AM  Result Value Ref Range   WBC 13.5 (H) 4.0 - 10.5 K/uL   RBC 4.47 4.22 - 5.81 MIL/uL   Hemoglobin 13.0 13.0 - 17.0 g/dL   HCT 38.9 (L) 39.0 - 52.0 %   MCV 87.0 78.0 - 100.0 fL   MCH 29.1 26.0 - 34.0 pg   MCHC 33.4 30.0 - 36.0 g/dL   RDW 14.2 11.5 - 15.5 %   Platelets 385 150 - 400 K/uL   Neutrophils Relative % 91 (H) 43 - 77 %   Neutro Abs 12.3 (H) 1.7 - 7.7 K/uL   Lymphocytes Relative 4 (L) 12 - 46 %   Lymphs Abs 0.6 (L) 0.7 - 4.0 K/uL   Monocytes Relative 5 3 - 12 %   Monocytes Absolute 0.7 0.1 - 1.0 K/uL   Eosinophils Relative 0 0 - 5 %   Eosinophils Absolute 0.0 0.0 - 0.7 K/uL   Basophils Relative 0 0 - 1 %   Basophils Absolute 0.0 0.0 - 0.1 K/uL  APTT     Status: None   Collection Time:  07/08/15  7:37 AM  Result Value Ref Range   aPTT 31 24 - 37 seconds    No results found.   Assessment/Plan: 69 y/o with complicated past medical history including alcoholism and apparently new onset seizures initially thought to be provoked alcohol related seizures. However, normal ETOH level on admission and further seizures in the hospital complicated by respiratory and cardiac arrest.  Unsure if patient is having just provoked alcohol related seizures or a focal onset seizure with secondary generalization and ictal bradyarrhythmia leading to cardiac arrest.  Ordered initiating seizure work up with EEG, but MRI brain won't be feasible as patient just got a temporary pacemaker inserted. Will favor loading patient with 1.5 gram IV keppra and continuing keppra 750 mg BID pending results of neuro testing. Will ultimately need continuous EEG monitoring to better characterized his seizures. Will follow up.  Dorian Pod, MD 07/08/2015, 11:30 AM  [facility]

## 2015-07-08 NOTE — Progress Notes (Signed)
EEG completed, results pending. 

## 2015-07-08 NOTE — Progress Notes (Addendum)
Patient ID: Francisco [name], male   DOB: Dec 09, 1945, 69 y.o.   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 Pt has no recollection of seizure activity but per EMS report, pt appeared post ictal upon their arrival.  In the ER CT of the head was unremarkable.  Addendum: Patient had breakthrough seizure requiring neurology consult Transferred to floor from SDU 8/29, had increased joint pain and swelling of R knee, L ankle and R big toe, started on Gout Rx with NSAIDs and Colchicine, improving  Code blue was called around 1800 due to patient post respiratory arrest after a witnessed seizure.  When I arrived in room patient was alert and awake but confused. No ativan administered by code blue team. I requested ativan be administered and patient transferred to stepdown unit.  Assessment/Plan:    Alcohol withdrawal seizure/ Alcohol abuse - breakthrough seizures, patient transferred to stepdown - Patient has been having breakthrough seizures and subsequently has had several code blue's called afterwards. [facility] as concern is that patient may not be having alcohol withdrawal seizures. Work up underway  Complete heart block - Cardiology on board and managing. - pt is s/p temporary pacemaker insertion.  Right knee, L ankle and R great toe swelling/pain - suspect Gouty flare, no fever or leukocytosis, also bruising around R knee secondary to fall prior to admission - Risk factors for gout, ETOH and HCTZ  - Uric acid low but can be unreliable in  Acute flare - continue colchicine  - stop HCTZ at discharge - Xrays unremarkable - PT to re-evaluate and determine need for Rehab  Hyponatremia - secondary to alcohol abuse  - workup at this time would not be accurate given his use of multiple diuretics prior to admission - Stable, not low enough to account for seizure like activity  Acute renal failure  - pre renal in etiology, resolved   Mild transaminitis  - alcohol induced hepatitis, LFT's elevated in the pattern of alcohol related damage - resolved   Hypokalemia  - resolved  Hypomagnesemia  - given Iv Mag x2, oral magnesium started - reassess magnesium levels.  Anemia of chronic disease, IDA -hb WNL now  B LE edema secondary to  - Resolving   HOCM - Diastolic CHF, chronic, grade I  - Echocardiogram; with normal EF - Addendum: B blocker discontinued  HTN - reasonable inpatient control   GERD - continue PPI  HLD  - Started Lipitor 40 mg daily   DVT prophylaxis - Lovenox SQ  Code Status: Full.  Family Communication:  plan of care discussed with the patient Disposition Plan: Pending recommendations from specialist.  IV access:  Peripheral IV  Procedures and diagnostic studies:    Dg Chest 2 View  07/03/2015  Mild diffuse peribronchial cuffing, suggestive of acute bronchitis.   Ct Head Wo Contrast  06/29/2015   Atrophy and chronic small vessel ischemic change. No acute intracranial abnormality.     Dg Chest Port 1 View 07/01/2015   Hypoinflation of the lungs. No acute cardiopulmonary abnormality seen.    Dg Knee Complete 4 Views Right 06/29/2015   No acute bony abnormality of  the right knee is observed. If there are clinical concerns of significant soft tissue injury-internal derangement, MRI would be the most useful imaging step.   Temporary pacemaker insertion.  Medical Consultants:  None  Other Consultants:  PT  IAnti-Infectives:   Doxycycline 8/28 -->  Velvet Bathe, MD  [facility] Pager  [phone]  If 7PM-7AM, please contact night-coverage [email or web address] Password [email or web address] 07/08/2015, 12:31 PM   LOS: 9 days   HPI/Subjective: Pt had seizure earlier this AM and had code blue episode.  Currently alert and awake. Has no new complaints.  Objective: Filed Vitals:   07/08/15 1000 07/08/15 1100 07/08/15 1200 07/08/15 1229  BP: 129/69 106/59 118/63   Pulse: 79 80 80   Temp:    98.9 F (37.2 C)  TempSrc:    Oral  Resp: 23 23 22    Height:      Weight:      SpO2: 99% 99% 100%     Intake/Output Summary (Last 24 hours) at 07/08/15 1231 Last data filed at 07/08/15 1000  Gross per 24 hour  Intake 1743.6 ml  Output   3475 ml  Net -1731.4 ml    Exam:   General:  Pt is alert and awake.  Cardiovascular: Regular rate and rhythm, no rubs  Respiratory: Clear to auscultation bilaterally, no wheezing, diminished breath sounds at bases   Abdomen: Soft, non tender, non distended, bowel sounds present, no guarding  Extremities: +1 bilateral LE edema with chronic venous stasis changes, right knee TTP and warm-bruising around R knee, L ankle swollen and R big toe swelling, all joints much improved from 8/29  Data Reviewed: Basic Metabolic Panel:  Recent Labs Lab 07/03/15 0300 07/04/15 0253 07/05/15 0309 07/06/15 0319 07/07/15 1820 07/08/15 0304  NA 127* 129* 128* 130*  --  129*  K 3.6 3.4* 3.5 3.5  --  3.7  CL 93* 93* 94* 93*  --  94*  CO2 27 27 25 29   --  24  GLUCOSE 121* 111* 115* 105*  --  120*  BUN 10 8 8 11   --  12  CREATININE 0.82 0.89 0.83 0.88  --  0.93  CALCIUM 8.8* 8.9 8.6* 9.0  --  8.7*  MG 2.3 1.5* 1.5*  --  1.6* 1.6*   Liver Function Tests:  Recent Labs Lab 07/04/15 0253  AST 39  ALT 26  ALKPHOS 56  BILITOT 1.2  PROT 6.7  ALBUMIN 3.0*   No results for input(s): LIPASE, AMYLASE in the last 168 hours. No results for input(s): AMMONIA in the last 168 hours. CBC:  Recent Labs Lab 07/04/15 0253 07/05/15 0309 07/06/15 0319 07/08/15 0737  WBC  7.0 7.7 6.8 13.5*  NEUTROABS  --   --   --  12.3*  HGB 13.6 12.1* 12.4* 13.0  HCT 39.8 35.0* 36.5* 38.9*  MCV 87.5 85.6 86.7 87.0  PLT 258 265 296 385   Cardiac Enzymes:  Recent Labs Lab 07/08/15 0304 07/08/15 0737  TROPONINI 0.07* 0.12*   BNP: Invalid input(s): POCBNP CBG: No results for input(s): GLUCAP in the last 168 hours.  Recent Results (from the past 240 hour(s))  MRSA PCR Screening     Status: None   Collection Time: 06/29/15  4:21 PM  Result Value Ref Range Status   MRSA by PCR NEGATIVE NEGATIVE Final    Comment:        The GeneXpert MRSA Assay (FDA approved for NASAL specimens only), is one component of a comprehensive MRSA colonization surveillance program.  It is not intended to diagnose MRSA infection nor to guide or monitor treatment for MRSA infections.   MRSA PCR Screening     Status: None   Collection Time: 07/08/15  3:41 AM  Result Value Ref Range Status   MRSA by PCR NEGATIVE NEGATIVE Final    Comment:        The GeneXpert MRSA Assay (FDA approved for NASAL specimens only), is one component of a comprehensive MRSA colonization surveillance program. It is not intended to diagnose MRSA infection nor to guide or monitor treatment for MRSA infections.      Scheduled Meds: . amiodarone      . atorvastatin  40 mg Oral q1800  . cholecalciferol  1,000 Units Oral Daily  . colchicine  0.6 mg Oral BID  . docusate sodium  100 mg Oral BID  . enoxaparin (LOVENOX) injection  40 mg Subcutaneous Q24H  . ferrous sulfate  325 mg Oral BID WC  . folic acid  1 mg Oral Daily  . levETIRAcetam  1,500 mg Intravenous STAT  . levETIRAcetam  750 mg Oral BID  . magnesium oxide  200 mg Oral BID  . naproxen  500 mg Oral BID WC  . pantoprazole  40 mg Oral Daily  . sodium chloride  3 mL Intravenous Q12H  . tamsulosin  0.4 mg Oral Daily  . thiamine  100 mg Oral Daily  . vitamin B-12  250 mcg Oral Daily  . vitamin C  500 mg Oral BID   Continuous Infusions: .  DOPamine Stopped (07/08/15 0947)  . epinephrine Stopped (07/08/15 0800)

## 2015-07-08 NOTE — Code Documentation (Signed)
  Patient Name: Francisco [name]   MRN: [id number]   Date of Birth/ Sex: [birth year] , male      Admission Date: 06/29/2015  Attending Provider: Velvet Bathe, MD  Primary Diagnosis: Alcohol withdrawal seizure   Indication: Pt was in his usual state of health until this PM, when he was noted to be brady to the 20s and unresponsive. Code blue was subsequently called. At the time of arrival on scene, ACLS protocol was underway.   Technical Description:  - CPR performance duration:  0  minutes  - Was defibrillation or cardioversion used? Yes   - Was external pacer placed? Yes  - Was patient intubated pre/post CPR? No   Medications Administered: Y = Yes; Blank = No Amiodarone    Atropine    Calcium    Epinephrine  1  Lidocaine    Magnesium    Norepinephrine    Phenylephrine    Sodium bicarbonate    Vasopressin     Post CPR evaluation:  - Final Status - Was patient successfully resuscitated ? Yes - What is current rhythm? Afib/a-flutter - What is current hemodynamic status? stable  Miscellaneous Information:  - Labs sent, including: BMP, Mg  - Primary team notified?  Yes  - Family Notified? No  - Additional notes/ transfer status: Transfer to [place], MD  07/08/2015, 3:15 AM

## 2015-07-09 ENCOUNTER — Encounter (HOSPITAL_COMMUNITY)
Admission: EM | Disposition: A | Discharge status: Skilled Nursing Facility | Payer: Self-pay | Source: Home / Self Care | Attending: Family Medicine

## 2015-07-09 DIAGNOSIS — I442 Atrioventricular block, complete: Secondary | ICD-10-CM

## 2015-07-09 DIAGNOSIS — R7989 Other specified abnormal findings of blood chemistry: Secondary | ICD-10-CM

## 2015-07-09 DIAGNOSIS — R778 Other specified abnormalities of plasma proteins: Secondary | ICD-10-CM | POA: Insufficient documentation

## 2015-07-09 HISTORY — PX: CARDIAC CATHETERIZATION: SHX172

## 2015-07-09 HISTORY — PX: EP IMPLANTABLE DEVICE: SHX172B

## 2015-07-09 LAB — CBC
HCT: 32.8 % — ABNORMAL LOW (ref 39.0–52.0)
Hemoglobin: 11 g/dL — ABNORMAL LOW (ref 13.0–17.0)
MCH: 29.3 pg (ref 26.0–34.0)
MCHC: 33.5 g/dL (ref 30.0–36.0)
MCV: 87.5 fL (ref 78.0–100.0)
Platelets: 330 10*3/uL (ref 150–400)
RBC: 3.75 MIL/uL — ABNORMAL LOW (ref 4.22–5.81)
RDW: 14.4 % (ref 11.5–15.5)
WBC: 9.2 10*3/uL (ref 4.0–10.5)

## 2015-07-09 LAB — BASIC METABOLIC PANEL
Anion gap: 6 (ref 5–15)
BUN: 12 mg/dL (ref 6–20)
CO2: 27 mmol/L (ref 22–32)
Calcium: 8.5 mg/dL — ABNORMAL LOW (ref 8.9–10.3)
Chloride: 99 mmol/L — ABNORMAL LOW (ref 101–111)
Creatinine, Ser: 0.87 mg/dL (ref 0.61–1.24)
GFR calc Af Amer: >60 mL/min (ref >60)
GFR calc non Af Amer: >60 mL/min (ref >60)
Glucose, Bld: 116 mg/dL — ABNORMAL HIGH (ref 65–99)
Potassium: 3.4 mmol/L — ABNORMAL LOW (ref 3.5–5.1)
Sodium: 132 mmol/L — ABNORMAL LOW (ref 135–145)

## 2015-07-09 SURGERY — TEMPORARY PACEMAKER
Anesthesia: LOCAL

## 2015-07-09 MED ORDER — MIDAZOLAM HCL 2 MG/2ML IJ SOLN
INTRAMUSCULAR | Status: DC | PRN
  Administered 2015-07-09: 1 mg via INTRAVENOUS

## 2015-07-09 MED ORDER — CEFAZOLIN SODIUM 1-5 GM-% IV SOLN
1.0000 g | Freq: Four times a day (QID) | INTRAVENOUS | Status: AC
  Administered 2015-07-09 – 2015-07-10 (×3): 1 g via INTRAVENOUS
  Filled 2015-07-09 (×4): qty 50

## 2015-07-09 MED ORDER — ONDANSETRON HCL 4 MG/2ML IJ SOLN: 4.0000 mg | Freq: Four times a day (QID) | INTRAMUSCULAR | Status: DC | PRN

## 2015-07-09 MED ORDER — FENTANYL CITRATE (PF) 100 MCG/2ML IJ SOLN
INTRAMUSCULAR | Status: DC | PRN
  Administered 2015-07-09: 25 ug via INTRAVENOUS

## 2015-07-09 MED ORDER — CEFAZOLIN SODIUM-DEXTROSE 2-3 GM-% IV SOLR
INTRAVENOUS | Status: DC | PRN
  Administered 2015-07-09: 2 g via INTRAVENOUS

## 2015-07-09 MED ORDER — CEFAZOLIN SODIUM-DEXTROSE 2-3 GM-% IV SOLR
INTRAVENOUS | Status: AC
  Filled 2015-07-09: qty 50

## 2015-07-09 MED ORDER — LIDOCAINE HCL (PF) 1 % IJ SOLN
INTRAMUSCULAR | Status: AC
  Filled 2015-07-09: qty 90

## 2015-07-09 MED ORDER — FENTANYL CITRATE (PF) 100 MCG/2ML IJ SOLN
INTRAMUSCULAR | Status: AC
  Filled 2015-07-09: qty 4

## 2015-07-09 MED ORDER — SODIUM CHLORIDE 0.9 % IR SOLN
Status: DC | PRN
  Administered 2015-07-09: 10:00:00

## 2015-07-09 MED ORDER — ACETAMINOPHEN 325 MG PO TABS
325.0000 mg | ORAL_TABLET | ORAL | Status: DC | PRN
  Administered 2015-07-09: 650 mg via ORAL
  Filled 2015-07-09: qty 1
  Filled 2015-07-09: qty 2

## 2015-07-09 MED ORDER — HEPARIN (PORCINE) IN NACL 2-0.9 UNIT/ML-% IJ SOLN
INTRAMUSCULAR | Status: AC
  Filled 2015-07-09: qty 1000

## 2015-07-09 MED ORDER — SODIUM CHLORIDE 0.9 % IR SOLN
Status: AC
  Filled 2015-07-09: qty 2

## 2015-07-09 MED ORDER — MIDAZOLAM HCL 5 MG/5ML IJ SOLN
INTRAMUSCULAR | Status: AC
  Filled 2015-07-09: qty 25

## 2015-07-09 MED FILL — Heparin Sodium (Porcine) 2 Unit/ML in Sodium Chloride 0.9%: INTRAMUSCULAR | Qty: 1000 | Status: AC

## 2015-07-09 MED FILL — Lidocaine HCl Local Preservative Free (PF) Inj 1%: INTRAMUSCULAR | Qty: 30 | Status: AC

## 2015-07-09 SURGICAL SUPPLY — 12 items
CABLE SURGICAL S-101-97-12 (CABLE) ×2 IMPLANT
CATH S G BIP PACING (SET/KITS/TRAYS/PACK) ×2 IMPLANT
HOVERMATT SINGLE USE (MISCELLANEOUS) ×2 IMPLANT
LEAD CAPSURE NOVUS 5076-52CM (Lead) ×2 IMPLANT
LEAD CAPSURE NOVUS 5076-58CM (Lead) ×2 IMPLANT
PACEMAKER ADAPTA DR ADDRL1 (Pacemaker) IMPLANT
PACK CARDIAC CATHETERIZATION (CUSTOM PROCEDURE TRAY) ×2 IMPLANT
PPM ADAPTA DR ADDRL1 (Pacemaker) ×3 IMPLANT
SHEATH CLASSIC 7F (SHEATH) ×6 IMPLANT
SHEATH PINNACLE 6F 10CM (SHEATH) ×2 IMPLANT
SLEEVE REPOSITIONING LENGTH 30 (MISCELLANEOUS) ×2 IMPLANT
TRAY PACEMAKER INSERTION (CUSTOM PROCEDURE TRAY) ×2 IMPLANT

## 2015-07-09 NOTE — Progress Notes (Signed)
Pt attempting to get OOB this AM. Dislodged temp pacer and HR in the 30s. Initiated transcutaneous pacing. Pt placed on a venturi mask 55%, sats low 90s. Pt sweating, placed fan on pt. Pt feeling the external shocks from pacing, 3 RN's stayed in room with pt the entire time until emergent trip to cath lab for new temp pacer. Plan to receive permanent pacemaker today when time allows.

## 2015-07-09 NOTE — Progress Notes (Signed)
PT Cancellation Note  Patient Details Name: Francisco [name] MRN: [id number] DOB: 1946/06/15   Cancelled Treatment:    Reason Eval/Treat Not Completed: Medical issues which prohibited therapy (pt with temporary pacer and femoral Aline s/p code. Will hold and attempt later date)   Melford Aase 07/09/2015, 7:43 AM Elwyn Reach, [place]

## 2015-07-09 NOTE — Progress Notes (Signed)
NEURO HOSPITALIST PROGRESS NOTE   SUBJECTIVE:                                                                                                                        No further seizures noted. EEG normal. MRI brain not feasible due to pacemaker. Cardiology team at the bedside as temporary pacemaker doesn't seem to be functioning properly and plan is for patient to undergo permanent pacemaker placement today. On keppra 500 mg BID.   OBJECTIVE:                                                                                                                           Vital signs in last 24 hours: Temp:  [98 F (36.7 C)-98.9 F (37.2 C)] 98 F (36.7 C) (09/02 0000) Pulse Rate:  [79-81] 79 (09/02 0700) Resp:  [16-29] 24 (09/02 0700) BP: (106-171)/(59-109) 144/62 mmHg (09/02 0700) SpO2:  [93 %-100 %] 99 % (09/02 0700) Arterial Line BP: (133-186)/(60-89) 156/73 mmHg (09/02 0700)  Intake/Output from previous day: 09/01 0701 - 09/02 0700 In: 1175.9 [P.O.:950; I.V.:110.9; IV Piggyback:115] Out: 2975 [Urine:2975] Intake/Output this shift:   Nutritional status: Diet gluten free Room service appropriate?: Yes; Fluid consistency:: Thin  Past Medical History  Diagnosis Date  . Celiac disease   . Hypertension   . Heart murmur   . Shortness of breath   . GERD (gastroesophageal reflux disease)   . 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 Anemia   . Multiple duodenal ulcers     Physical exam:  Constitutional: well developed, pleasant male in no apparent distress. Eyes: no jaundice or exophthalmos.  Head: normocephalic. Neck: supple, no bruits, no JVD. Cardiac: no murmurs. Lungs: clear. Abdomen: soft, no tender, no mass. Extremities: no edema, clubbing, or cyanosis.  Skin: no rash  Neurologic Exam:  General: Mental Status: Alert, oriented, thought content appropriate. Speech fluent without evidence of aphasia.  Able to follow 3 step commands without difficulty. Cranial Nerves: II: Discs flat bilaterally; Visual fields grossly normal, pupils equal, round, reactive to light and accommodation III,IV, VI: ptosis not present, extra-ocular motions intact bilaterally V,VII: smile symmetric, facial light touch sensation normal bilaterally VIII: hearing normal bilaterally IX,X: uvula rises symmetrically XI: bilateral  shoulder shrug XII: midline tongue extension without atrophy or fasciculations  Motor: Moves all limbs symmetrically Tone and bulk:normal tone throughout; no atrophy noted Sensory: Pinprick and light touch intact throughout, bilaterally Deep Tendon Reflexes:  1+ all over Plantars: Right: downgoingLeft: downgoing Cerebellar: normal finger-to-nose, heel-to-shin no tested Gait:  No tested due to multiple leads, seizure precautions  Lab Results: Lab Results  Component Value Date/Time   CHOL 201* 06/09/2015 10:14 AM   Lipid Panel No results for input(s): CHOL, TRIG, HDL, CHOLHDL, VLDL, LDLCALC in the last 72 hours.  Studies/Results: No results found.  MEDICATIONS                                                                                                                        Scheduled: . atorvastatin  40 mg Oral q1800  . cholecalciferol  1,000 Units Oral Daily  . colchicine  0.6 mg Oral Daily  . docusate sodium  100 mg Oral BID  . enoxaparin (LOVENOX) injection  40 mg Subcutaneous Q24H  . ferrous sulfate  325 mg Oral BID WC  . folic acid  1 mg Oral Daily  . levETIRAcetam  750 mg Oral BID  . magnesium oxide  200 mg Oral BID  . pantoprazole  40 mg Oral Daily  . sodium chloride  3 mL Intravenous Q12H  . tamsulosin  0.4 mg Oral Daily  . thiamine  100 mg Oral Daily  . vitamin B-12  250 mcg Oral Daily  . vitamin C  500 mg Oral BID    ASSESSMENT/PLAN:                                                                                                            69 y/o with complicated past medical history including alcoholism and apparently new onset seizures initially thought to be provoked alcohol related seizures. However, normal ETOH level on admission and further seizures in the hospital complicated by respiratory and cardiac arrest.  Unsure if patient is having just provoked alcohol related seizures or a focal onset seizure with secondary generalization and ictal bradyarrhythmia leading to cardiac arrest.  EEG normal, but MRI brain won't be feasible as patient just got a temporary pacemaker inserted. Continue keppra 750 mg BID. Will ultimately need continuous EEG monitoring to better characterized his seizures. Neurology will sign off.  Dorian Pod, MD [facility] [phone]  07/09/2015, 8:40 AM

## 2015-07-09 NOTE — Consult Note (Signed)
ELECTROPHYSIOLOGY CONSULT NOTE    Patient ID: Francisco [name] MRN: [id number], DOB/AGE: [birth year] 69 y.o.  Admit date: 06/29/2015 Date of Consult: 07/09/2015  Primary Physician: Karis Juba, PA-C Primary Cardiologist: new to [facility]  Reason for Consultation: complete heart block  HPI:  Francisco [name] is a 69 y.o. male with a past medical history significant for hypertension, celiac disease, HOCM.  He presented to the hospital for evaluation of "seizures".  He was found to be in complete heart block with a ventricular rate in the 30's with altered mental status requiring transcutaneous pacing.  He underwent urgent temp pacemaker placement yesterday morning. This morning, he dislodged his temp wire and is now in CHB with external pacing and increased shortness of breath.  ROS and HPI is difficult to obtain because of shortness of breath and complete heart block.  He is currently on non-rebreather.    Cath on admission demonstrated normal coronary arteries, EF 65%.    Echo 06/2015 demonstrated EF 29-79%, grade 1 diastolic dysfunction, moderate LVH, mild AR, mild MR, LA 46.   Past Medical History  Diagnosis Date  . Celiac disease   . Hypertension   . Heart murmur   . Shortness of breath   . GERD (gastroesophageal reflux disease)   . 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 Anemia   . Multiple duodenal ulcers      Surgical History:  Past Surgical History  Procedure Laterality Date  . Hemorrhoid surgery    . Mole removal    . Hernia repair    . Esophagogastroduodenoscopy  07/03/2012    Procedure: ESOPHAGOGASTRODUODENOSCOPY (EGD);  Surgeon: Winfield Cunas., MD;  Location: [facility] ENDOSCOPY;  Service: Endoscopy;  Laterality: N/A;  . Cardiac catheterization N/A 07/08/2015    Procedure: Left Heart Cath and Coronary Angiography;  Surgeon: Troy Sine, MD;  Location: [place] [facility];  Service: Cardiovascular;  Laterality: N/A;  . Cardiac  catheterization N/A 07/08/2015    Procedure: Temporary Pacemaker;  Surgeon: Troy Sine, MD;  Location: [place] [facility];  Service: Cardiovascular;  Laterality: N/A;     Prescriptions prior to admission  Medication Sig Dispense Refill Last Dose  . amLODipine (NORVASC) 5 MG tablet Take 1 tablet (5 mg total) by mouth daily. 30 tablet 0 06/28/2015 at Unknown time  . Cholecalciferol (VITAMIN D3) 1000 UNITS CAPS Take 1 capsule (1,000 Units total) by mouth daily. 30 capsule 11 06/28/2015 at Unknown time  . cyclobenzaprine (FLEXERIL) 10 MG tablet Take 10 mg by mouth every 8 (eight) hours as needed. For muscle spasm.   unk  . furosemide (LASIX) 20 MG tablet Take 1 tablet (20 mg total) by mouth daily. 30 tablet 0 06/28/2015 at Unknown time  . lisinopril-hydrochlorothiazide (PRINZIDE,ZESTORETIC) 20-25 MG per tablet TAKE 1 TABLET BY MOUTH EVERY DAY 30 tablet 0 06/28/2015 at Unknown time  . pantoprazole (PROTONIX) 40 MG tablet TAKE 1 TABLET BY MOUTH TWICE DAILY BEFORE A MEAL 60 tablet 0 06/28/2015 at Unknown time  . pravastatin (PRAVACHOL) 40 MG tablet TAKE 1 TABLET BY MOUTH EVERY EVENING 90 tablet 0 06/28/2015 at Unknown time  . tamsulosin (FLOMAX) 0.4 MG CAPS capsule Take 1 capsule (0.4 mg total) by mouth daily. 90 capsule 3 06/28/2015 at Unknown time  . potassium chloride (KLOR-CON 10) 10 MEQ tablet Take 1 tablet (10 mEq total) by mouth daily. (Patient not taking: Reported on 06/29/2015) 30 tablet 0     Inpatient Medications:  . atorvastatin  40 mg Oral q1800  . cholecalciferol  1,000 Units Oral Daily  . colchicine  0.6 mg Oral Daily  . docusate sodium  100 mg Oral BID  . enoxaparin (LOVENOX) injection  40 mg Subcutaneous Q24H  . ferrous sulfate  325 mg Oral BID WC  . folic acid  1 mg Oral Daily  . levETIRAcetam  750 mg Oral BID  . magnesium oxide  200 mg Oral BID  . pantoprazole  40 mg Oral Daily  . sodium chloride  3 mL Intravenous Q12H  . tamsulosin  0.4 mg Oral Daily  . thiamine  100 mg Oral  Daily  . vitamin B-12  250 mcg Oral Daily  . vitamin C  500 mg Oral BID    Allergies:  Allergies  Allergen Reactions  . Gluten Meal Other (See Comments)    Celiac Disease    Social History   Social History  . Marital Status: Married    Spouse Name: N/A  . Number of Children: N/A  . Years of Education: N/A   Occupational History  . Not on file.   Social History Main Topics  . Smoking status: Former Smoker -- 0.50 packs/day for 50 years    Types: Cigarettes  . Smokeless tobacco: Former Systems developer  . Alcohol Use: 0.0 oz/week    1-2 Glasses of wine per week     Comment: ocassional  . Drug Use: No  . Sexual Activity: No   Other Topics Concern  . Not on file   Social History Narrative   Lives alone. --as of 11/2013   Smokes e-cig. Has nicotine in it. No real cigarette any more     Family History  Problem Relation Age of Onset  . Hodgkin's lymphoma Father      Review of Systems: All other systems reviewed and are otherwise negative except as noted above.  Physical Exam: Filed Vitals:   07/09/15 0400 07/09/15 0500 07/09/15 0600 07/09/15 0700  BP: 133/66 143/71 149/82 144/62  Pulse: 80 79 79 79  Temp:      TempSrc:      Resp: 24 26 19 24   Height:      Weight:      SpO2: 98% 99% 97% 99%    GEN- The patient is acutely ill appearing, alert and oriented x 3 today.   HEENT: normocephalic, atraumatic; sclera clear, conjunctiva pink; hearing intact; oropharynx clear; neck supple  Lymph- no cervical lymphadenopathy Lungs- Clear to ausculation bilaterally, increased work of breathing.  No wheezes, rales, rhonchi Heart- Bradycardic regular rate and rhythm  GI- soft, non-tender, non-distended, bowel sounds present  Extremities- no clubbing, cyanosis, or edema  MS- no significant deformity or atrophy Skin- warm and dry, no rash or lesion Psych- euthymic mood, full affect Neuro- strength and sensation are intact  Labs:   Lab Results  Component Value Date   WBC 9.2  07/09/2015   HGB 11.0* 07/09/2015   HCT 32.8* 07/09/2015   MCV 87.5 07/09/2015   PLT 330 07/09/2015    Recent Labs Lab 07/04/15 0253  07/09/15 0544  NA 129*  < > 132*  K 3.4*  < > 3.4*  CL 93*  < > 99*  CO2 27  < > 27  BUN 8  < > 12  CREATININE 0.89  < > 0.87  CALCIUM 8.9  < > 8.5*  PROT 6.7  --   --   BILITOT 1.2  --   --   ALKPHOS 56  --   --  ALT 26  --   --   AST 39  --   --   GLUCOSE 111*  < > 116*  < > = values in this interval not displayed.    Radiology/Studies: Dg Chest 2 View 07/03/2015   CLINICAL DATA:  69 year old male with history of dyspnea. Former smoker (quit 2 years ago).  EXAM: CHEST  2 VIEW  COMPARISON:  Chest x-ray 07/01/2015.  FINDINGS: Mild diffuse peribronchial cuffing. Lung volumes are normal. No consolidative airspace disease. No pleural effusions. No pneumothorax. No pulmonary nodule or mass noted. Pulmonary vasculature and the cardiomediastinal silhouette are within normal limits. Atherosclerosis in the thoracic aorta.  IMPRESSION: 1. Mild diffuse peribronchial cuffing, suggestive of acute bronchitis. 2. Atherosclerosis.   Electronically Signed   By: Vinnie Langton M.D.   On: 07/03/2015 13:52   HFG:[id number] heart block, ventricular rate 30  TELEMETRY: complete heart block with intermittent V pacing  Assessment/Plan: 1.  Complete heart block The patient has complete heart block with no reversible causes identified.  He is not on AVN blocking agents.  No CAD by cath.  TSH normal.  He is symptomatic with shortness of breath with temp wire dislodgement. Vincent Streater urgently reposition temp wire followed by permanent pacemaker implantation. Risks, benefits reviewed with the patient who gave verbal consent for procedure.    2. HTN Stable No change required today   Signed, Chanetta Marshall, NP 07/09/2015 8:41 AM  I have seen and examined this patient with Chanetta Marshall.  Agree with above, note added to reflect my findings.  On exam, heart is bradycardic without  murmurs.  Patient had bradycardia at 30 with complete heart block.  Patient had seizure associated with VT for which he received ICD shock.  Likely due to bradycardia and seizure.  Emergent pacemaker planned for today.   Andera Cranmer M. Melissia Lahman MD 07/09/2015 12:11 PM

## 2015-07-09 NOTE — Progress Notes (Addendum)
Subjective:  Pt tried to get out of bed this am and dislodged temp pacer;    Objective:   Vital Signs : Filed Vitals:   07/09/15 0400 07/09/15 0500 07/09/15 0600 07/09/15 0700  BP: 133/66 143/71 149/82 144/62  Pulse: 80 79 79 79  Temp:      TempSrc:      Resp: 24 26 19 24   Height:      Weight:      SpO2: 98% 99% 97% 99%    Intake/Output from previous day:  Intake/Output Summary (Last 24 hours) at 07/09/15 0841 Last data filed at 07/09/15 0600  Gross per 24 hour  Intake 1075.8 ml  Output   2975 ml  Net -1899.2 ml    I/O since admission: -3707  Wt Readings from Last 3 Encounters:  07/08/15 253 lb 8.5 oz (115 kg)  06/09/15 227 lb (102.967 kg)  09/14/14 232 lb (105.235 kg)    Medications: . atorvastatin  40 mg Oral q1800  . cholecalciferol  1,000 Units Oral Daily  . colchicine  0.6 mg Oral Daily  . docusate sodium  100 mg Oral BID  . enoxaparin (LOVENOX) injection  40 mg Subcutaneous Q24H  . ferrous sulfate  325 mg Oral BID WC  . folic acid  1 mg Oral Daily  . levETIRAcetam  750 mg Oral BID  . magnesium oxide  200 mg Oral BID  . pantoprazole  40 mg Oral Daily  . sodium chloride  3 mL Intravenous Q12H  . tamsulosin  0.4 mg Oral Daily  . thiamine  100 mg Oral Daily  . vitamin B-12  250 mcg Oral Daily  . vitamin C  500 mg Oral BID    . DOPamine Stopped (07/08/15 0947)  . epinephrine Stopped (07/08/15 0800)    Physical Exam:   General appearance: alert; groaning Neck: no carotid bruit, no JVD, supple, symmetrical, trachea midline and thyroid not enlarged, symmetric, no tenderness/mass/nodules Lungs: decreased BS diffusely; rhonchi Heart: externally paced;  Abdomen:  Abdominal breathing; no masses,  no organomegaly Extremities: no edema, redness or tenderness in the calves or thighs   Rate: 32  Rhythm: CHB:   Externally paced at 60 since temp pacer is no longer capturing   Lab Results:   Recent Labs  07/07/15 1820  07/08/15 0304 07/08/15 0412  07/08/15 1600 07/09/15 0544  NA  --   < > 129* 122* 131* 132*  K  --   < > 3.7 2.8* 3.9 3.4*  CL  --   < > 94* 101 99* 99*  CO2  --   --  24  --  25 27  GLUCOSE  --   < > 120* 153* 110* 116*  BUN  --   < > 12 11 15 12   CREATININE  --   < > 0.93 0.90 1.05 0.87  CALCIUM  --   --  8.7*  --  8.4* 8.5*  MG 1.6*  --  1.6*  --  2.0  --   < > = values in this interval not displayed.  Hepatic Function Latest Ref Rng 07/04/2015 07/01/2015 06/30/2015  Total Protein 6.5 - 8.1 g/dL 6.7 7.0 6.7  Albumin 3.5 - 5.0 g/dL 3.0(L) 3.4(L) 3.6  AST 15 - 41 U/L 39 69(H) 68(H)  ALT 17 - 63 U/L 26 41 44  Alk Phosphatase 38 - 126 U/L 56 51 58  Total Bilirubin 0.3 - 1.2 mg/dL 1.2 1.0 1.4(H)     Recent  Labs  07/08/15 0412 07/08/15 0737 07/09/15 0544  WBC  --  13.5* 9.2  NEUTROABS  --  12.3*  --   HGB 10.9* 13.0 11.0*  HCT 32.0* 38.9* 32.8*  MCV  --  87.0 87.5  PLT  --  385 330     Recent Labs  07/08/15 0304 07/08/15 0737 07/08/15 1600  TROPONINI 0.07* 0.12* 0.15*    Lab Results  Component Value Date   TSH 1.477 06/29/2015   No results for input(s): HGBA1C in the last 72 hours.  No results for input(s): PROT, ALBUMIN, AST, ALT, ALKPHOS, BILITOT, BILIDIR, IBILI in the last 72 hours.  Recent Labs  07/08/15 0737  INR 1.01   BNP (last 3 results) No results for input(s): BNP in the last 8760 hours.  ProBNP (last 3 results) No results for input(s): PROBNP in the last 8760 hours.   Lipid Panel     Component Value Date/Time   CHOL 201* 06/09/2015 1014   TRIG 59 06/09/2015 1014   HDL 124 06/09/2015 1014   CHOLHDL 1.6 06/09/2015 1014   VLDL 12 06/09/2015 1014   LDLCALC 65 06/09/2015 1014     Imaging:  No results found.    Assessment/Plan:   Principal Problem:   Alcohol withdrawal seizure Active Problems:   HTN (hypertension)   Hyponatremia   Microcytic anemia   GERD (gastroesophageal reflux disease)   BPH (benign prostatic hypertrophy)   Vitamin D deficiency    Hyperlipidemia   Bilateral lower extremity edema (chronic)   Alcohol abuse, daily use   Hypertrophic obstructive cardiomyopathy (HOCM)   Mild diastolic dysfunction   Acute pain of right knee   Transaminitis   Acute renal failure   High anion gap metabolic acidosis   Acute hypokalemia   Alcohol withdrawal   Alcohol abuse   Acute renal failure syndrome   Hypokalemia   Hypomagnesemia   Absolute anemia   Hypertrophic cardiomyopathy   Diastolic CHF, acute on chronic   Essential hypertension   Chronic diastolic congestive heart failure   HLD (hyperlipidemia)   Cardiac arrest   Complete heart block   Seizures  1. Complete heart block; temporary transveous pacemaker dislodged;  attempt to reposition at bedside inadequate capture; now with external pacemaker and significant discomfort with each pacemaker beat. Will take back to lab emergently  for new temp pacer and plan permanent pacemaker today when EP schedule allows;(currently not available). 2. Seizures  ? If related to complete heart block 3. HypoK 4. Positive troponins, flat curve 5. Marked T wave abnormality on ECG yesterday with normal coronarie.  6 ? HOCM with provokable gradient on a remote echo  Plan emergent pacemaker.  Troy Sine, MD, [facility] 07/09/2015, 8:41 AM

## 2015-07-09 NOTE — Progress Notes (Addendum)
Patient ID: Francisco [name], male   DOB: 04-23-1946, 69 y.o.   MRN: [id number]  [facility] PROGRESS NOTE  Francisco [name] BWI:[id number] DOB: December 14, 1945 DOA: 06/29/2015 PCP: Karis Juba, PA-C  TRANSFER from Team 1 to me 8/29  Brief narrative:    69 year old male with celiac disease, hypertension, mild hypertrophic obstructive cardiomyopathy/diastolic dysfunction, alcoholism, recent bilateral lower extremity edema, duodenal ulcer with GIB, chronic microcytic anemia, BPH, GERD,presented to [facility] ED for evaluation of seizure like activity. Pt has no recollection of seizure activity but per EMS report, pt appeared post ictal upon their arrival.  In the ER CT of the head was unremarkable.  Addendum: Since admission patient has had some breakthrough seizures which resulted in patient having 2 code blues called. Also on further work up patient had heart block. Cardiology consulted. Transferred to floor from SDU 8/29, had increased joint pain and swelling of R knee, L ankle and R big toe, started on Gout Rx with NSAIDs and Colchicine, improving  Code blue was called around 1800 due to patient post respiratory arrest after a witnessed seizure.  When I arrived in room patient was alert and awake but confused. No ativan administered by code blue team. I requested ativan be administered and patient transferred to stepdown unit.  Assessment/Plan:    Alcohol withdrawal seizure/ Alcohol abuse - breakthrough seizures, patient transferred to stepdown. Etiology uncertain ? As to whether new diagnosis of complete heart block is contributing - Neurology consulted.  Complete heart block - Cardiology on board and managing. - pt is s/p dislodgement of temporary pacemaker. Required trip to cath lab to try and place permanent pacemaker.   Right knee, L ankle and R great toe swelling/pain - suspect Gouty flare, no fever or leukocytosis, also bruising around R knee secondary to fall prior to  admission - Risk factors for gout, ETOH and HCTZ  - Uric acid low but can be unreliable in Acute flare - continue colchicine  - stop HCTZ at discharge - Xrays unremarkable - PT to re-evaluate and determine need for Rehab  Hyponatremia - secondary to alcohol abuse  - trending up - stable, not low enough to account for seizure like activity  Acute renal failure  - pre renal in etiology, resolved   Mild transaminitis  - alcohol induced hepatitis, LFT's elevated in the pattern of alcohol related damage - resolved   Hypokalemia  - resolved  Hypomagnesemia  - required multiple IV replacements - resolved.  Anemia of chronic disease, IDA -hb WNL now  B LE edema secondary to  - Resolving   HOCM - Diastolic CHF, chronic, grade I  - Echocardiogram; with normal EF - B blocker discontinued in lieu of complete heart block  HTN - reasonable inpatient control   GERD - continue PPI  HLD  - Started Lipitor 40 mg daily   DVT prophylaxis - Lovenox SQ  Code Status: Full.  Family Communication:  plan of care discussed with the patient Disposition Plan: Pending recommendations from specialist.  IV access:  Peripheral IV  Procedures and diagnostic studies:    Dg Chest 2 View  07/03/2015  Mild diffuse peribronchial cuffing, suggestive of acute bronchitis.   Ct Head Wo Contrast  06/29/2015   Atrophy and chronic small vessel ischemic change. No acute intracranial abnormality.     Dg Chest Port 1 View 07/01/2015   Hypoinflation of the lungs. No acute cardiopulmonary abnormality seen.    Dg Knee Complete 4 Views Right 06/29/2015   No acute  bony abnormality of the right knee is observed. If there are clinical concerns of significant soft tissue injury-internal derangement, MRI would be the most useful imaging step.   Temporary pacemaker insertion.  Medical Consultants:  None  Other Consultants:  PT  IAnti-Infectives:   Doxycycline 8/28 -->  Velvet Bathe,  MD  [facility] Pager [phone]  If 7PM-7AM, please contact night-coverage [email or web address] Password [email or web address] 07/09/2015, 12:42 PM   LOS: 10 days   HPI/Subjective: Pt has no new complaints. Some chest discomfort  Objective: Filed Vitals:   07/09/15 1145 07/09/15 1200 07/09/15 1205 07/09/15 1215  BP:   188/85 196/78  Pulse: 68 66 69 67  Temp:      TempSrc:      Resp: 20 17 20 25   Height:      Weight:      SpO2: 98% 99% 99% 100%    Intake/Output Summary (Last 24 hours) at 07/09/15 1242 Last data filed at 07/09/15 1100  Gross per 24 hour  Intake    805 ml  Output    875 ml  Net    -70 ml    Exam:   General:  Pt is alert and awake.  Cardiovascular: Regular rate and rhythm, no rubs  Respiratory: Clear to auscultation bilaterally, no wheezing, no increased wob  Abdomen: Soft, non tender, non distended, bowel sounds present, no guarding  Extremities: +1 bilateral LE edema with chronic venous stasis changes, right knee TTP and warm-bruising around R knee, L ankle swollen and R big toe swelling, all joints much improved from 8/29  Data Reviewed: Basic Metabolic Panel:  Recent Labs Lab 07/04/15 0253 07/05/15 0309 07/06/15 0319 07/07/15 1820 07/08/15 0304 07/08/15 0412 07/08/15 1600 07/09/15 0544  NA 129* 128* 130*  --  129* 122* 131* 132*  K 3.4* 3.5 3.5  --  3.7 2.8* 3.9 3.4*  CL 93* 94* 93*  --  94* 101 99* 99*  CO2 27 25 29   --  24  --  25 27  GLUCOSE 111* 115* 105*  --  120* 153* 110* 116*  BUN 8 8 11   --  12 11 15 12   CREATININE 0.89 0.83 0.88  --  0.93 0.90 1.05 0.87  CALCIUM 8.9 8.6* 9.0  --  8.7*  --  8.4* 8.5*  MG 1.5* 1.5*  --  1.6* 1.6*  --  2.0  --    Liver Function Tests:  Recent Labs Lab 07/04/15 0253  AST 39  ALT 26  ALKPHOS 56  BILITOT 1.2  PROT 6.7  ALBUMIN 3.0*   No results for input(s): LIPASE, AMYLASE in the last 168 hours. No results for input(s): AMMONIA in the last 168 hours. CBC:  Recent Labs Lab 07/04/15 0253 07/05/15 0309  07/06/15 0319 07/08/15 0412 07/08/15 0737 07/09/15 0544  WBC 7.0 7.7 6.8  --  13.5* 9.2  NEUTROABS  --   --   --   --  12.3*  --   HGB 13.6 12.1* 12.4* 10.9* 13.0 11.0*  HCT 39.8 35.0* 36.5* 32.0* 38.9* 32.8*  MCV 87.5 85.6 86.7  --  87.0 87.5  PLT 258 265 296  --  385 330   Cardiac Enzymes:  Recent Labs Lab 07/08/15 0304 07/08/15 0737 07/08/15 1600  TROPONINI 0.07* 0.12* 0.15*   BNP: Invalid input(s): POCBNP CBG: No results for input(s): GLUCAP in the last 168 hours.  Recent Results (from the past 240 hour(s))  MRSA PCR Screening     Status: None  Collection Time: 06/29/15  4:21 PM  Result Value Ref Range Status   MRSA by PCR NEGATIVE NEGATIVE Final    Comment:        The GeneXpert MRSA Assay (FDA approved for NASAL specimens only), is one component of a comprehensive MRSA colonization surveillance program. It is not intended to diagnose MRSA infection nor to guide or monitor treatment for MRSA infections.   MRSA PCR Screening     Status: None   Collection Time: 07/08/15  3:41 AM  Result Value Ref Range Status   MRSA by PCR NEGATIVE NEGATIVE Final    Comment:        The GeneXpert MRSA Assay (FDA approved for NASAL specimens only), is one component of a comprehensive MRSA colonization surveillance program. It is not intended to diagnose MRSA infection nor to guide or monitor treatment for MRSA infections.      Scheduled Meds: . atorvastatin  40 mg Oral q1800  .  ceFAZolin (ANCEF) IV  1 g Intravenous Q6H  . cholecalciferol  1,000 Units Oral Daily  . colchicine  0.6 mg Oral Daily  . docusate sodium  100 mg Oral BID  . enoxaparin (LOVENOX) injection  40 mg Subcutaneous Q24H  . ferrous sulfate  325 mg Oral BID WC  . folic acid  1 mg Oral Daily  . levETIRAcetam  750 mg Oral BID  . magnesium oxide  200 mg Oral BID  . pantoprazole  40 mg Oral Daily  . sodium chloride  3 mL Intravenous Q12H  . tamsulosin  0.4 mg Oral Daily  . thiamine  100 mg Oral  Daily  . vitamin B-12  250 mcg Oral Daily  . vitamin C  500 mg Oral BID   Continuous Infusions: . DOPamine Stopped (07/08/15 0947)  . epinephrine Stopped (07/08/15 0800)

## 2015-07-10 ENCOUNTER — Inpatient Hospital Stay (HOSPITAL_COMMUNITY): Payer: Commercial Managed Care - HMO

## 2015-07-10 DIAGNOSIS — I10 Essential (primary) hypertension: Secondary | ICD-10-CM

## 2015-07-10 DIAGNOSIS — E876 Hypokalemia: Secondary | ICD-10-CM

## 2015-07-10 DIAGNOSIS — I422 Other hypertrophic cardiomyopathy: Secondary | ICD-10-CM

## 2015-07-10 LAB — BASIC METABOLIC PANEL
Anion gap: 8 (ref 5–15)
BUN: 9 mg/dL (ref 6–20)
CO2: 28 mmol/L (ref 22–32)
Calcium: 8.6 mg/dL — ABNORMAL LOW (ref 8.9–10.3)
Chloride: 96 mmol/L — ABNORMAL LOW (ref 101–111)
Creatinine, Ser: 0.83 mg/dL (ref 0.61–1.24)
GFR calc Af Amer: >60 mL/min (ref >60)
GFR calc non Af Amer: >60 mL/min (ref >60)
Glucose, Bld: 111 mg/dL — ABNORMAL HIGH (ref 65–99)
Potassium: 3.2 mmol/L — ABNORMAL LOW (ref 3.5–5.1)
Sodium: 132 mmol/L — ABNORMAL LOW (ref 135–145)

## 2015-07-10 LAB — MAGNESIUM: Magnesium: 1.5 mg/dL — ABNORMAL LOW (ref 1.7–2.4)

## 2015-07-10 MED ORDER — POTASSIUM CHLORIDE CRYS ER 20 MEQ PO TBCR
40.0000 meq | EXTENDED_RELEASE_TABLET | Freq: Two times a day (BID) | ORAL | Status: DC
  Administered 2015-07-10 – 2015-07-13 (×7): 40 meq via ORAL
  Filled 2015-07-10 (×5): qty 2

## 2015-07-10 MED ORDER — METOPROLOL TARTRATE 25 MG PO TABS
25.0000 mg | ORAL_TABLET | Freq: Two times a day (BID) | ORAL | Status: DC
  Administered 2015-07-10 – 2015-07-13 (×7): 25 mg via ORAL
  Filled 2015-07-10 (×8): qty 1

## 2015-07-10 MED ORDER — NAPROXEN 250 MG PO TABS
250.0000 mg | ORAL_TABLET | Freq: Once | ORAL | Status: AC
  Administered 2015-07-10: 250 mg via ORAL
  Filled 2015-07-10: qty 1

## 2015-07-10 MED ORDER — MAGNESIUM OXIDE 400 (241.3 MG) MG PO TABS
400.0000 mg | ORAL_TABLET | Freq: Two times a day (BID) | ORAL | Status: DC
Start: 2015-07-10 — End: 2015-07-11
  Administered 2015-07-10 – 2015-07-11 (×3): 400 mg via ORAL
  Filled 2015-07-10 (×3): qty 1

## 2015-07-10 NOTE — Progress Notes (Addendum)
Patient ID: Francisco [name], male   DOB: [birth year], 69 y.o.   MRN: [id number]  [facility] PROGRESS NOTE  UMAIR ROSILES QMG:[id number] DOB: 1946-03-03 DOA: 06/29/2015 PCP: Karis Juba, PA-C  TRANSFER from Team 1 to me 8/29  Brief narrative:    69 year old male with celiac disease, hypertension, mild hypertrophic obstructive cardiomyopathy/diastolic dysfunction, alcoholism, recent bilateral lower extremity edema, duodenal ulcer with GIB, chronic microcytic anemia, BPH, GERD,presented to [facility] ED for evaluation of seizure like activity. Pt has no recollection of seizure activity but per EMS report, pt appeared post ictal upon their arrival.  In the ER CT of the head was unremarkable.  Addendum: Since admission patient has had some breakthrough seizures which resulted in patient having 2 code blues called. Also on further work up patient had heart block. Cardiology consulted. Transferred to floor from SDU 8/29, had increased joint pain and swelling of R knee, L ankle and R big toe, started on Gout Rx with NSAIDs and Colchicine, improving  Patient has no new complaints. States that he feels better today.  Assessment/Plan:    Alcohol withdrawal seizure/ Alcohol abuse - breakthrough seizures, patient transferred to stepdown. Etiology uncertain ? As to whether new diagnosis of complete heart block is contributing - Neurology consulted and recommended continuing Keppra 750 mg BID  Complete heart block - Cardiology on board and managing. - pt is s/p permanent pacemaker placement. Transfer to telemetry floor.  Right knee, L ankle and R great toe swelling/pain - suspect Gouty flare, no fever or leukocytosis, also bruising around R knee secondary to fall prior to admission - Risk factors for gout, ETOH and HCTZ  - Uric acid low but can be unreliable in Acute flare - continue colchicine  - stop HCTZ at discharge - Xrays unremarkable - PT to re-evaluate and determine need for  Rehab  Hyponatremia - secondary to alcohol abuse  - trending up - stable, not low enough to account for seizure like activity  Acute renal failure  - pre renal in etiology, resolved   Mild transaminitis  - alcohol induced hepatitis, LFT's elevated in the pattern of alcohol related damage - resolved   Hypokalemia  - currently on potassium replacement.  Hypomagnesemia  - required multiple IV replacements - resolved.  Anemia of chronic disease, IDA -hb WNL now  B LE edema secondary to  - Resolving   HOCM - Diastolic CHF, chronic, grade I  - Echocardiogram; with normal EF - B blocker discontinued in lieu of complete heart block  HTN - reasonable inpatient control   GERD - continue PPI  HLD  - Started Lipitor 40 mg daily   DVT prophylaxis - Lovenox SQ  Code Status: Full.  Family Communication:  plan of care discussed with the patient Disposition Plan: Pending recommendations from specialist.  IV access:  Peripheral IV  Procedures and diagnostic studies:    Dg Chest 2 View  07/03/2015  Mild diffuse peribronchial cuffing, suggestive of acute bronchitis.   Ct Head Wo Contrast  06/29/2015   Atrophy and chronic small vessel ischemic change. No acute intracranial abnormality.     Dg Chest Port 1 View 07/01/2015   Hypoinflation of the lungs. No acute cardiopulmonary abnormality seen.    Dg Knee Complete 4 Views Right 06/29/2015   No acute bony abnormality of the right knee is observed. If there are clinical concerns of significant soft tissue injury-internal derangement, MRI would be the most useful imaging step.   Temporary pacemaker insertion.  Medical Consultants:  None  Other Consultants:  PT  IAnti-Infectives:   Cefazolin  Velvet Bathe, MD  [facility] Pager [phone]  If 7PM-7AM, please contact night-coverage [email or web address] Password [email or web address] 07/10/2015, 1:31 PM   LOS: 11 days   HPI/Subjective: Some chest discomfort from compressions but otherwise no new  complaints.  Objective: Filed Vitals:   07/10/15 0800 07/10/15 1000 07/10/15 1100 07/10/15 1207  BP: 178/68 166/65 149/62 173/73  Pulse: 76  71 71  Temp:    98.6 F (37 C)  TempSrc:    Oral  Resp: 22 23 27 22   Height:      Weight:      SpO2: 95%   95%    Intake/Output Summary (Last 24 hours) at 07/10/15 1331 Last data filed at 07/10/15 0900  Gross per 24 hour  Intake    740 ml  Output   1175 ml  Net   -435 ml    Exam:   General:  Pt is alert and awake.  Cardiovascular: Regular rate and rhythm, no rubs  Respiratory: Clear to auscultation bilaterally, no wheezing, no increased wob  Abdomen: Soft, non tender, non distended, bowel sounds present, no guarding  Extremities: +1 bilateral LE edema with chronic venous stasis changes, right knee TTP and warm-bruising around R knee, L ankle swollen and R big toe swelling, all joints much improved from 8/29  Data Reviewed: Basic Metabolic Panel:  Recent Labs Lab 07/05/15 0309 07/06/15 0319 07/07/15 1820 07/08/15 0304 07/08/15 0412 07/08/15 1600 07/09/15 0544 07/10/15 0430  NA 128* 130*  --  129* 122* 131* 132* 132*  K 3.5 3.5  --  3.7 2.8* 3.9 3.4* 3.2*  CL 94* 93*  --  94* 101 99* 99* 96*  CO2 25 29  --  24  --  25 27 28   GLUCOSE 115* 105*  --  120* 153* 110* 116* 111*  BUN 8 11  --  12 11 15 12 9   CREATININE 0.83 0.88  --  0.93 0.90 1.05 0.87 0.83  CALCIUM 8.6* 9.0  --  8.7*  --  8.4* 8.5* 8.6*  MG 1.5*  --  1.6* 1.6*  --  2.0  --  1.5*   Liver Function Tests:  Recent Labs Lab 07/04/15 0253  AST 39  ALT 26  ALKPHOS 56  BILITOT 1.2  PROT 6.7  ALBUMIN 3.0*   No results for input(s): LIPASE, AMYLASE in the last 168 hours. No results for input(s): AMMONIA in the last 168 hours. CBC:  Recent Labs Lab 07/04/15 0253 07/05/15 0309 07/06/15 0319 07/08/15 0412 07/08/15 0737 07/09/15 0544  WBC 7.0 7.7 6.8  --  13.5* 9.2  NEUTROABS  --   --   --   --  12.3*  --   HGB 13.6 12.1* 12.4* 10.9* 13.0 11.0*   HCT 39.8 35.0* 36.5* 32.0* 38.9* 32.8*  MCV 87.5 85.6 86.7  --  87.0 87.5  PLT 258 265 296  --  385 330   Cardiac Enzymes:  Recent Labs Lab 07/08/15 0304 07/08/15 0737 07/08/15 1600  TROPONINI 0.07* 0.12* 0.15*   BNP: Invalid input(s): POCBNP CBG: No results for input(s): GLUCAP in the last 168 hours.  Recent Results (from the past 240 hour(s))  MRSA PCR Screening     Status: None   Collection Time: 07/08/15  3:41 AM  Result Value Ref Range Status   MRSA by PCR NEGATIVE NEGATIVE Final    Comment:        The GeneXpert MRSA Assay (  FDA approved for NASAL specimens only), is one component of a comprehensive MRSA colonization surveillance program. It is not intended to diagnose MRSA infection nor to guide or monitor treatment for MRSA infections.      Scheduled Meds: . atorvastatin  40 mg Oral q1800  . cholecalciferol  1,000 Units Oral Daily  . colchicine  0.6 mg Oral Daily  . docusate sodium  100 mg Oral BID  . enoxaparin (LOVENOX) injection  40 mg Subcutaneous Q24H  . ferrous sulfate  325 mg Oral BID WC  . folic acid  1 mg Oral Daily  . levETIRAcetam  750 mg Oral BID  . magnesium oxide  400 mg Oral BID  . metoprolol tartrate  25 mg Oral BID  . pantoprazole  40 mg Oral Daily  . potassium chloride  40 mEq Oral BID  . sodium chloride  3 mL Intravenous Q12H  . tamsulosin  0.4 mg Oral Daily  . thiamine  100 mg Oral Daily  . vitamin B-12  250 mcg Oral Daily  . vitamin C  500 mg Oral BID   Continuous Infusions: . DOPamine Stopped (07/08/15 0947)  . epinephrine Stopped (07/08/15 0800)

## 2015-07-10 NOTE — Progress Notes (Signed)
    Subjective:  Denies dyspnea; patient complains of chest soreness from recent compressions.   Objective:  Filed Vitals:   07/10/15 0300 07/10/15 0400 07/10/15 0500 07/10/15 0600  BP: 156/41 161/61 158/42 194/99  Pulse: 79 71 71 82  Temp:  97.8 F (36.6 C)    TempSrc:  Oral    Resp: 23 20 23 31   Height:  5' 9"  (1.753 m)    Weight:  220 lb 7.4 oz (100 kg)    SpO2: 94% 100% 91% 97%    Intake/Output from previous day:  Intake/Output Summary (Last 24 hours) at 07/10/15 0654 Last data filed at 07/10/15 0400  Gross per 24 hour  Intake    830 ml  Output    775 ml  Net     55 ml    Physical Exam: Physical exam: Well-developed well-nourished in no acute distress.  Skin is warm and dry.  HEENT is normal.  Neck is supple.  Chest is clear to auscultation with normal expansion. Pacemaker site with no hematoma. Cardiovascular exam is regular rate and rhythm.  Abdominal exam nontender or distended. No masses palpated. Extremities show no edema. neuro grossly intact    Lab Results: Basic Metabolic Panel:  Recent Labs  07/08/15 1600 07/09/15 0544 07/10/15 0430  NA 131* 132* 132*  K 3.9 3.4* 3.2*  CL 99* 99* 96*  CO2 25 27 28   GLUCOSE 110* 116* 111*  BUN 15 12 9   CREATININE 1.05 0.87 0.83  CALCIUM 8.4* 8.5* 8.6*  MG 2.0  --  1.5*   CBC:  Recent Labs  07/08/15 0737 07/09/15 0544  WBC 13.5* 9.2  NEUTROABS 12.3*  --   HGB 13.0 11.0*  HCT 38.9* 32.8*  MCV 87.0 87.5  PLT 385 330   Cardiac Enzymes:  Recent Labs  07/08/15 0304 07/08/15 0737 07/08/15 1600  TROPONINI 0.07* 0.12* 0.15*     Assessment/Plan:  1 complete heart block-status post permanent pacemaker 2 alcoholism-management per primary care. 3 hypertension-given history of left ventricular outflow tract gradient would add metoprolol 25 mg twice a day and increase as needed. Pacemaker now in place. 4 mildly elevated troponin-normal coronary arteries on catheterization. 5 history of GI bleed  and microcytic anemia 6 hypokalemia-supplement Patient can be transferred to telemetry from a cardiac standpoint. Kirk Ruths 07/10/2015, 6:54 AM

## 2015-07-10 NOTE — Progress Notes (Signed)
Report given to nurse Barnabas Lister on [facility].

## 2015-07-10 NOTE — Progress Notes (Signed)
07/10/2015  6:33 PM  Transfer Note:   Traveling Method: Via Wheelchair with NT Transferring Unit: 2[facility] Mental Orientation: Alert and oriented X4 Telemetry: Per MD orders placed on box 6E28, paced Assessment: Completed Skin: Right elbow abrasion with drainage (new foam added); Right groin swelling and bruising with pressure dressing clean, dry and intact; Left shoulder pressure dressing (unable to assess) clean, dry and intact; Left under armpit bruise, Right hip bruise; Right knee swollen and bruised; Back abrasion with no drainage (OTA); Face peeling; Left shin excoriated. Skin assessed with second RN Cybill Eckelmann.  IV: Clean, dry and intact. Pain: Stated none at this time  Tubes: N/A Safety Measures: Safety Fall Prevention Plan has been given, discussed. Bed alarm initiated.  Admission: Completed [facility] Orientation: Patient has been orientated to the room, unit and staff.  Family: None at this time Transferring Incident::  Patient ready for a telemetry bed, no longer need of higher level of care per MD notes  Orders have been reviewed and implemented. Will continue to monitor the patient. Call light has been placed within reach and bed alarm has been activated.   [employer], RN-BC, RN3 Phone number: [phone]

## 2015-07-10 NOTE — Progress Notes (Signed)
Patient continuously refusing to be repositioned in bed.  Patient also refused stand up scale and refused to sit on the side of the bed.  Patient stated he is refusing due to pain and that he will use stand up scale and sit on the side of the bed tomorrow. Vicie Mutters, RN

## 2015-07-11 LAB — BASIC METABOLIC PANEL
Anion gap: 6 (ref 5–15)
BUN: 8 mg/dL (ref 6–20)
CO2: 29 mmol/L (ref 22–32)
Calcium: 8.8 mg/dL — ABNORMAL LOW (ref 8.9–10.3)
Chloride: 92 mmol/L — ABNORMAL LOW (ref 101–111)
Creatinine, Ser: 0.71 mg/dL (ref 0.61–1.24)
GFR calc Af Amer: >60 mL/min (ref >60)
GFR calc non Af Amer: >60 mL/min (ref >60)
Glucose, Bld: 131 mg/dL — ABNORMAL HIGH (ref 65–99)
Potassium: 4 mmol/L (ref 3.5–5.1)
Sodium: 127 mmol/L — ABNORMAL LOW (ref 135–145)

## 2015-07-11 MED ORDER — SPIRONOLACTONE 25 MG PO TABS
12.5000 mg | ORAL_TABLET | Freq: Two times a day (BID) | ORAL | Status: DC
  Administered 2015-07-11 – 2015-07-13 (×5): 12.5 mg via ORAL
  Filled 2015-07-11 (×5): qty 1

## 2015-07-11 MED ORDER — BENZONATATE 100 MG PO CAPS
100.0000 mg | ORAL_CAPSULE | Freq: Once | ORAL | Status: AC
  Administered 2015-07-11: 100 mg via ORAL
  Filled 2015-07-11: qty 1

## 2015-07-11 MED ORDER — MAGNESIUM SULFATE IN D5W 10-5 MG/ML-% IV SOLN
1.0000 g | Freq: Once | INTRAVENOUS | Status: AC
  Administered 2015-07-11: 1 g via INTRAVENOUS
  Filled 2015-07-11: qty 100

## 2015-07-11 MED ORDER — DEXTROMETHORPHAN POLISTIREX ER 30 MG/5ML PO SUER
30.0000 mg | Freq: Two times a day (BID) | ORAL | Status: DC
  Administered 2015-07-11 – 2015-07-13 (×5): 30 mg via ORAL
  Filled 2015-07-11 (×6): qty 5

## 2015-07-11 NOTE — Progress Notes (Signed)
07/11/2015 9:43 AM  Patient concerned about coughing all night last night. Stated that the medication that was given last night was not helpful. Prefers a syrup. Informed attending MD. Will continue to assess and monitor the patient.   [employer], RN-BC, [facility] [facility] Phone [phone]

## 2015-07-11 NOTE — Progress Notes (Addendum)
Patient ID: Francisco [name], male   DOB: Feb 07, 1946, 69 y.o.   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 Pt has no recollection of seizure activity but per EMS report, pt appeared post ictal upon their arrival.  In the ER CT of the head was unremarkable.  Addendum: Since admission patient has had some breakthrough seizures which resulted in patient having 2 code blues called. Also on further work up patient had heart block. Cardiology consulted. Transferred to floor from SDU 8/29, had increased joint pain and swelling of R knee, L ankle and R big toe, started on Gout Rx with NSAIDs and Colchicine, improving  Patient has no new complaints. States that he feels better today.  Assessment/Plan:    Alcohol withdrawal seizure/ Alcohol abuse - breakthrough seizures, patient transferred to stepdown. Etiology uncertain ? As to whether new diagnosis of complete heart block is contributing - Neurology consulted and recommended continuing Keppra 750 mg BID  Complete heart block - Cardiology on board and managing. - pt is s/p permanent pacemaker placement.  - HR has ranged from 60-83  Right knee, L ankle and R great toe swelling/pain - suspect Gouty flare, no fever or leukocytosis, also bruising around R knee secondary to fall prior to admission - Risk factors for gout, ETOH and HCTZ  - Uric acid low but can be unreliable in Acute flare - continue colchicine  - stop HCTZ at discharge - Xrays unremarkable - PT to re-evaluate and determine need for  Rehab  Hyponatremia - secondary to alcohol abuse  - trending up - stable, not low enough to account for seizure like activity  Acute renal failure  - pre renal in etiology, resolved   Mild transaminitis  - alcohol induced hepatitis, LFT's elevated in the pattern of alcohol related damage - resolved   Hypokalemia  - currently on potassium replacement. - Low magnesium will need to be replaced.  Hypomagnesemia  - replace IV  Anemia of chronic disease, IDA -hb WNL now  B LE edema secondary to  - Resolving   HOCM - Diastolic CHF, chronic, grade I  - Echocardiogram; with normal EF - B blocker discontinued in lieu of complete heart block  HTN - reasonable inpatient control   GERD - continue PPI  HLD  - Started Lipitor 40 mg daily   DVT prophylaxis - Lovenox SQ  Code Status: Full.  Family Communication:  plan of care discussed with the patient Disposition Plan: With continued improvement will plan on discharging next am.  IV access:  Peripheral IV  Procedures and diagnostic studies:    Dg Chest 2 View  07/03/2015  Mild diffuse peribronchial cuffing, suggestive of acute bronchitis.   Ct Head Wo Contrast  06/29/2015   Atrophy and chronic small vessel ischemic change. No acute intracranial abnormality.     Dg Chest Port 1 View 07/01/2015   Hypoinflation of the lungs. No acute cardiopulmonary abnormality seen.    Dg Knee Complete 4 Views Right 06/29/2015   No acute bony abnormality of the right knee is observed. If there are clinical concerns of significant soft tissue injury-internal derangement, MRI would be the  most useful imaging step.   Temporary pacemaker insertion.  Medical Consultants:  None  Other Consultants:  PT  IAnti-Infectives:   Cefazolin  Velvet Bathe, MD  [facility] Pager [phone]  If 7PM-7AM, please contact night-coverage [email or web address] Password [email or web address] 07/11/2015, 1:10 PM   LOS: 12 days   HPI/Subjective: Pt has no new  complaints.  Objective: Filed Vitals:   07/10/15 2200 07/11/15 0319 07/11/15 0500 07/11/15 0910  BP: 180/77  144/81 140/66  Pulse: 83  60 83  Temp: 98.5 F (36.9 C)  99 F (37.2 C) 97.5 F (36.4 C)  TempSrc: Oral  Oral Oral  Resp: 22   20  Height: 5' 9"  (1.753 m)     Weight: 100 kg (220 lb 7.4 oz)     SpO2: 95% 96% 98% 96%    Intake/Output Summary (Last 24 hours) at 07/11/15 1310 Last data filed at 07/11/15 1101  Gross per 24 hour  Intake    563 ml  Output    925 ml  Net   -362 ml    Exam:   General:  Pt is alert and awake.  Cardiovascular: Regular rate and rhythm, no rubs  Respiratory: Clear to auscultation bilaterally, no wheezing, no increased wob  Abdomen: Soft, non tender, non distended, bowel sounds present, no guarding  Extremities: +1 bilateral LE edema with chronic venous stasis changes, right knee TTP and warm-bruising around R knee, L ankle swollen and R big toe swelling, all joints much improved from 8/29  Data Reviewed: Basic Metabolic Panel:  Recent Labs Lab 07/05/15 0309 07/06/15 0319 07/07/15 1820 07/08/15 0304 07/08/15 0412 07/08/15 1600 07/09/15 0544 07/10/15 0430  NA 128* 130*  --  129* 122* 131* 132* 132*  K 3.5 3.5  --  3.7 2.8* 3.9 3.4* 3.2*  CL 94* 93*  --  94* 101 99* 99* 96*  CO2 25 29  --  24  --  25 27 28   GLUCOSE 115* 105*  --  120* 153* 110* 116* 111*  BUN 8 11  --  12 11 15 12 9   CREATININE 0.83 0.88  --  0.93 0.90 1.05 0.87 0.83  CALCIUM 8.6* 9.0  --  8.7*  --  8.4* 8.5* 8.6*  MG 1.5*  --  1.6* 1.6*  --  2.0  --  1.5*   Liver Function Tests: No results for input(s): AST, ALT, ALKPHOS, BILITOT, PROT, ALBUMIN in the last 168 hours. No results for input(s): LIPASE, AMYLASE in the last 168 hours. No results for input(s): AMMONIA in the last 168 hours. CBC:  Recent Labs Lab 07/05/15 0309 07/06/15 0319 07/08/15 0412 07/08/15 0737 07/09/15 0544  WBC 7.7 6.8  --  13.5* 9.2  NEUTROABS  --   --   --  12.3*  --   HGB  12.1* 12.4* 10.9* 13.0 11.0*  HCT 35.0* 36.5* 32.0* 38.9* 32.8*  MCV 85.6 86.7  --  87.0 87.5  PLT 265 296  --  385 330   Cardiac Enzymes:  Recent Labs Lab 07/08/15 0304 07/08/15 0737 07/08/15 1600  TROPONINI 0.07* 0.12* 0.15*   BNP: Invalid input(s): POCBNP CBG: No results for input(s): GLUCAP in the last 168 hours.  Recent Results (from the past 240 hour(s))  MRSA PCR Screening     Status: None   Collection Time: 07/08/15  3:41 AM  Result Value Ref Range Status   MRSA by PCR NEGATIVE NEGATIVE Final    Comment:        The GeneXpert  MRSA Assay (FDA approved for NASAL specimens only), is one component of a comprehensive MRSA colonization surveillance program. It is not intended to diagnose MRSA infection nor to guide or monitor treatment for MRSA infections.      Scheduled Meds: . atorvastatin  40 mg Oral q1800  . cholecalciferol  1,000 Units Oral Daily  . colchicine  0.6 mg Oral Daily  . dextromethorphan  30 mg Oral BID  . docusate sodium  100 mg Oral BID  . enoxaparin (LOVENOX) injection  40 mg Subcutaneous Q24H  . ferrous sulfate  325 mg Oral BID WC  . folic acid  1 mg Oral Daily  . levETIRAcetam  750 mg Oral BID  . magnesium oxide  400 mg Oral BID  . metoprolol tartrate  25 mg Oral BID  . pantoprazole  40 mg Oral Daily  . potassium chloride  40 mEq Oral BID  . sodium chloride  3 mL Intravenous Q12H  . spironolactone  12.5 mg Oral BID  . tamsulosin  0.4 mg Oral Daily  . thiamine  100 mg Oral Daily  . vitamin B-12  250 mcg Oral Daily  . vitamin C  500 mg Oral BID   Continuous Infusions:

## 2015-07-11 NOTE — Progress Notes (Addendum)
    Subjective:   Status post pacemaker for bradycardia and apparently positive dependent ventricular tachycardia for which he was shocked.  Chart raises the issue of HCM however, echocardiogram 8/16 describes only moderate LVH with moderate-severe left atrial enlargement.  C/o hemoptysis  NO chest pain or shortness of breath   Objective:  Filed Vitals:   07/10/15 2200 07/11/15 0319 07/11/15 0500 07/11/15 0910  BP: 180/77  144/81 140/66  Pulse: 83  60 83  Temp: 98.5 F (36.9 C)  99 F (37.2 C) 97.5 F (36.4 C)  TempSrc: Oral  Oral Oral  Resp: 22   20  Height: 5' 9"  (1.753 m)     Weight: 220 lb 7.4 oz (100 kg)     SpO2: 95% 96% 98% 96%    Intake/Output from previous day:  Intake/Output Summary (Last 24 hours) at 07/11/15 1110 Last data filed at 07/11/15 1101  Gross per 24 hour  Intake    803 ml  Output    925 ml  Net   -122 ml    Physical Exam: Physical exam: Well-developed well-nourished in no acute distress.  Skin is warm and dry.  HEENT is normal.  Neck is supple.  Chest is clear to auscultation with normal expansion. Pacemaker site with superficial eccymosis and some swellng without significant pocket hematoma Cardiovascular exam is regular rate and rhythm.  Abdominal exam nontender or distended. No masses palpated. Extremities show no edema. neuro grossly intact    Lab Results: Basic Metabolic Panel:  Recent Labs  07/08/15 1600 07/09/15 0544 07/10/15 0430  NA 131* 132* 132*  K 3.9 3.4* 3.2*  CL 99* 99* 96*  CO2 25 27 28   GLUCOSE 110* 116* 111*  BUN 15 12 9   CREATININE 1.05 0.87 0.83  CALCIUM 8.4* 8.5* 8.6*  MG 2.0  --  1.5*   CBC:  Recent Labs  07/09/15 0544  WBC 9.2  HGB 11.0*  HCT 32.8*  MCV 87.5  PLT 330   Cardiac Enzymes:  Recent Labs  07/08/15 1600  TROPONINI 0.15*     Assessment/Plan:  1 complete heart block-status post permanent pacemaker 2 alcoholism-management per primary care. 3 hypertension-given history of  left ventricular outflow tract gradient would add metoprolol 25 mg twice a day and increase as needed. Pacemaker now in place. 4 mildly elevated troponin-normal coronary arteries on catheterization. 5 history of GI bleed and microcytic anemia 6 hypokalemia-supplement Patient can be transferred to telemetry from a cardiac standpoint.   With hypertension and hypokalemia  Will add Aldactone Will arrange pacemaker follow-up.  Will check pocket in am

## 2015-07-11 NOTE — Progress Notes (Signed)
07/11/2015 11:46 AM  Rapid Response and this RN assessed patient left shoulder dressing due to patient shoulder red and pink in color and warm to touch. Removed pressure dressing and then an additional dressing underneath the first pressure dressing. Patient pacemaker in place with multiple white adhesive strips. While assessing Cardio MD was at bedside and also assessed patient pacemaker (refer to Cardio note).   Applied a new pink foam dressing. Tender to touch. Will continue to assess and monitor.  [employer], RN-BC, [employer] [facility] Phone [phone]

## 2015-07-12 DIAGNOSIS — E785 Hyperlipidemia, unspecified: Secondary | ICD-10-CM

## 2015-07-12 DIAGNOSIS — I5033 Acute on chronic diastolic (congestive) heart failure: Secondary | ICD-10-CM

## 2015-07-12 DIAGNOSIS — N179 Acute kidney failure, unspecified: Secondary | ICD-10-CM

## 2015-07-12 LAB — BASIC METABOLIC PANEL
Anion gap: 8 (ref 5–15)
BUN: 8 mg/dL (ref 6–20)
CO2: 27 mmol/L (ref 22–32)
Calcium: 8.8 mg/dL — ABNORMAL LOW (ref 8.9–10.3)
Chloride: 93 mmol/L — ABNORMAL LOW (ref 101–111)
Creatinine, Ser: 0.77 mg/dL (ref 0.61–1.24)
GFR calc Af Amer: >60 mL/min (ref >60)
GFR calc non Af Amer: >60 mL/min (ref >60)
Glucose, Bld: 121 mg/dL — ABNORMAL HIGH (ref 65–99)
Potassium: 4.4 mmol/L (ref 3.5–5.1)
Sodium: 128 mmol/L — ABNORMAL LOW (ref 135–145)

## 2015-07-12 MED ORDER — DEXTROMETHORPHAN POLISTIREX ER 30 MG/5ML PO SUER: 30.0000 mg | Freq: Two times a day (BID) | ORAL | Status: DC

## 2015-07-12 MED ORDER — SPIRONOLACTONE 25 MG PO TABS: 12.5000 mg | ORAL_TABLET | Freq: Two times a day (BID) | ORAL | Status: DC

## 2015-07-12 MED ORDER — COLCHICINE 0.6 MG PO TABS: 0.6000 mg | ORAL_TABLET | Freq: Every day | ORAL | Status: DC

## 2015-07-12 MED ORDER — DOCUSATE SODIUM 100 MG PO CAPS: 100.0000 mg | ORAL_CAPSULE | Freq: Two times a day (BID) | ORAL | Status: DC

## 2015-07-12 MED ORDER — FERROUS SULFATE 325 (65 FE) MG PO TABS: 325.0000 mg | ORAL_TABLET | Freq: Two times a day (BID) | ORAL | Status: DC

## 2015-07-12 MED ORDER — METOPROLOL TARTRATE 25 MG PO TABS: 25.0000 mg | ORAL_TABLET | Freq: Two times a day (BID) | ORAL | Status: DC

## 2015-07-12 MED ORDER — LEVETIRACETAM 750 MG PO TABS: 750.0000 mg | ORAL_TABLET | Freq: Two times a day (BID) | ORAL | Status: DC

## 2015-07-12 NOTE — Discharge Summary (Addendum)
Physician Discharge Summary  Francisco [name] FYB:[id number] DOB: 06/12/1946 DOA: 06/29/2015  PCP: Karis Juba, PA-C  Admit date: 06/29/2015 Discharge date: 07/12/2015  Time spent: > 35 minutes  Recommendations for Outpatient Follow-up:  1. Patient will need follow-up with neurologist and cardiologist after hospital discharge 2. Required permanent pacemaker placement 3. Monitor sodium levels 4. Monitor magnesium levels last replaced 07/11/2015 with 1 g IV magnesium  Discharge Diagnoses:  Principal Problem:   Alcohol withdrawal seizure Active Problems:   HTN (hypertension)   Hyponatremia   Microcytic anemia   GERD (gastroesophageal reflux disease)   BPH (benign prostatic hypertrophy)   Vitamin D deficiency   Hyperlipidemia   Bilateral lower extremity edema (chronic)   Hypertrophic obstructive cardiomyopathy (HOCM)   Mild diastolic dysfunction   Acute pain of right knee   Transaminitis   Acute renal failure   High anion gap metabolic acidosis   Acute hypokalemia   Alcohol withdrawal   Acute renal failure syndrome   Hypokalemia   Hypomagnesemia   Absolute anemia   Hypertrophic cardiomyopathy   Diastolic CHF, acute on chronic   Essential hypertension   Chronic diastolic congestive heart failure   HLD (hyperlipidemia)   Cardiac arrest   Complete heart block   Seizures   Elevated troponin   Discharge Condition: Stable  Diet recommendation: Heart healthy  Filed Weights   07/10/15 0400 07/10/15 2200 07/11/15 2253  Weight: 100 kg (220 lb 7.4 oz) 100 kg (220 lb 7.4 oz) 101.73 kg (224 lb 4.4 oz)    History of present illness:  69 year old male with celiac disease, hypertension, 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 Pt has no recollection of seizure activity but per  EMS report, pt appeared post ictal upon their arrival.  In the ER CT of the head was unremarkable.  Since admission patient has had some breakthrough seizures which resulted in patient having 2 code blues called. Also on further work up patient had heart block. Cardiology consulted. Transferred to floor from SDU 8/29, had increased joint pain and swelling of R knee, L ankle and R big toe, started on Gout Rx with NSAIDs and Colchicine, improving  Hospital Course:  Alcohol withdrawal seizure/ Alcohol abuse - Neurology consulted and recommended placing patient on [place] following up with urologist after discharge  Complete heart block - Cardiology on board while patient was in house - Permanent pacemaker placed  Right knee, L ankle and R great toe swelling/pain - suspect Gouty flare, no fever or leukocytosis, also bruising around R knee secondary to fall prior to admission - Risk factors for gout, ETOH and HCTZ  - Uric acid low but can be unreliable in Acute flare - continue colchicine  - stop HCTZ at discharge - Xrays unremarkable  Hyponatremia - secondary to alcohol abuse  - workup at this time would not be accurate given his use of multiple diuretics prior to admission - Stable, not low enough to account for seizure like activity  Acute renal failure  - pre renal in etiology, resolved   Mild transaminitis  - alcohol induced hepatitis, LFT's elevated in the pattern of alcohol related damage - resolved   Hypokalemia  - resolved  Hypomagnesemia  -Replaced several times. Yesterday 07/11/2015 was given IV replacement - Reassess as outpatient  Anemia of chronic disease, IDA -hb WNL now  B LE edema secondary to  -  Resolving   HOCM - Diastolic CHF, chronic, grade I  - Echocardiogram; with normal EF - Beta blocker restarted and will be continued on discharge  HTN - reasonable inpatient control   GERD - continue PPI  HLD  - Continue statin on  discharge  Procedures:  Permanent pacemaker placement  Consultations:  Neurology  Cardiology  Discharge Exam: Filed Vitals:   07/12/15 0845  BP: 145/67  Pulse: 77  Temp: 97.8 F (36.6 C)  Resp: 22    General: Pt in nad, alert and awake Cardiovascular: rrr, no mrg Respiratory: cta bl, no wheezes  Discharge Instructions   Discharge Instructions    Call MD for:  redness, tenderness, or signs of infection (pain, swelling, redness, odor or green/yellow discharge around incision site)    Complete by:  As directed      Call MD for:  severe uncontrolled pain    Complete by:  As directed      Call MD for:  temperature >100.4    Complete by:  As directed      Diet - low sodium heart healthy    Complete by:  As directed      Discharge instructions    Complete by:  As directed   Please follow up with the neurologist in 2-3 weeks after discharge.  Also f/u with the Cardiologist after discharge     Increase activity slowly    Complete by:  As directed           Current Discharge Medication List    START taking these medications   Details  colchicine 0.6 MG tablet Take 1 tablet (0.6 mg total) by mouth daily. Qty: 30 tablet, Refills: 0    dextromethorphan (DELSYM) 30 MG/5ML liquid Take 5 mLs (30 mg total) by mouth 2 (two) times daily. Qty: 89 mL, Refills: 0    docusate sodium (COLACE) 100 MG capsule Take 1 capsule (100 mg total) by mouth 2 (two) times daily. Qty: 10 capsule, Refills: 0    ferrous sulfate 325 (65 FE) MG tablet Take 1 tablet (325 mg total) by mouth 2 (two) times daily with a meal. Qty: 60 tablet, Refills: 0    levETIRAcetam (KEPPRA) 750 MG tablet Take 1 tablet (750 mg total) by mouth 2 (two) times daily. Qty: 60 tablet, Refills: 0    metoprolol tartrate (LOPRESSOR) 25 MG tablet Take 1 tablet (25 mg total) by mouth 2 (two) times daily. Qty: 60 tablet, Refills: 0    spironolactone (ALDACTONE) 25 MG tablet Take 0.5 tablets (12.5 mg total) by mouth 2  (two) times daily. Qty: 60 tablet, Refills: 0      CONTINUE these medications which have NOT CHANGED   Details  Cholecalciferol (VITAMIN D3) 1000 UNITS CAPS Take 1 capsule (1,000 Units total) by mouth daily. Qty: 30 capsule, Refills: 11   Associated Diagnoses: Unspecified vitamin D deficiency    pantoprazole (PROTONIX) 40 MG tablet TAKE 1 TABLET BY MOUTH TWICE DAILY BEFORE A MEAL Qty: 60 tablet, Refills: 0    pravastatin (PRAVACHOL) 40 MG tablet TAKE 1 TABLET BY MOUTH EVERY EVENING Qty: 90 tablet, Refills: 0    tamsulosin (FLOMAX) 0.4 MG CAPS capsule Take 1 capsule (0.4 mg total) by mouth daily. Qty: 90 capsule, Refills: 3      STOP taking these medications     amLODipine (NORVASC) 5 MG tablet      cyclobenzaprine (FLEXERIL) 10 MG tablet      furosemide (LASIX) 20 MG tablet  lisinopril-hydrochlorothiazide (PRINZIDE,ZESTORETIC) 20-25 MG per tablet      potassium chloride (KLOR-CON 10) 10 MEQ tablet        Allergies  Allergen Reactions  . Gluten Meal Other (See Comments)    Celiac Disease      The results of significant diagnostics from this hospitalization (including imaging, microbiology, ancillary and laboratory) are listed below for reference.    Significant Diagnostic Studies: Dg Chest 2 View  07/10/2015   CLINICAL DATA:  Cardiac catheterization placement on 07/09/2015  EXAM: CHEST  2 VIEW  COMPARISON:  Radiographic 07/03/2015  FINDINGS: Left-sided pacemaker with 2 continuous leads overlies normal cardiac silhouette. Stable mildly enlarged cardiac silhouette. There is mild interstitial edema pattern unchanged prior. Small bilateral pleural effusions are increased.  IMPRESSION: 1. Interval placement of pacemaker without complication. 2. Increased interstitial edema and small bilateral effusions.   Electronically Signed   By: Suzy Bouchard M.D.   On: 07/10/2015 08:35   Dg Chest 2 View  07/03/2015   CLINICAL DATA:  69 year old male with history of dyspnea.  Former smoker (quit 2 years ago).  EXAM: CHEST  2 VIEW  COMPARISON:  Chest x-ray 07/01/2015.  FINDINGS: Mild diffuse peribronchial cuffing. Lung volumes are normal. No consolidative airspace disease. No pleural effusions. No pneumothorax. No pulmonary nodule or mass noted. Pulmonary vasculature and the cardiomediastinal silhouette are within normal limits. Atherosclerosis in the thoracic aorta.  IMPRESSION: 1. Mild diffuse peribronchial cuffing, suggestive of acute bronchitis. 2. Atherosclerosis.   Electronically Signed   By: Vinnie Langton M.D.   On: 07/03/2015 13:52   Ct Head Wo Contrast  06/29/2015   CLINICAL DATA:  Seizure.  Alcohol abuse.  EXAM: CT HEAD WITHOUT CONTRAST  TECHNIQUE: Contiguous axial images were obtained from the base of the skull through the vertex without intravenous contrast.  COMPARISON:  Head CT and brain MRI 11/19/2005  FINDINGS: No intracranial hemorrhage, mass effect, or midline shift. No hydrocephalus. The basilar cisterns are patent. No evidence of territorial infarct. No intracranial fluid collection. Generalized cerebral and cerebellar atrophy. Mild chronic small vessel ischemic change. Small lacunar infarct in the left basal ganglia, unchanged. Calvarium is intact. Included paranasal sinuses and mastoid air cells are well aerated.  IMPRESSION: Atrophy and chronic small vessel ischemic change. No acute intracranial abnormality.   Electronically Signed   By: Jeb Levering M.D.   On: 06/29/2015 06:56   Dg Chest Port 1 View  07/01/2015   CLINICAL DATA:  Shortness of breath.  EXAM: PORTABLE CHEST - 1 VIEW  COMPARISON:  November 03, 2009.  FINDINGS: Hypoinflation of the lungs is noted. Mild cardiomegaly is noted, but this may simply be due to hypoinflated status. No pneumothorax or pleural effusion is noted. No acute pulmonary disease is noted. Bony thorax is intact.  IMPRESSION: Hypoinflation of the lungs. No acute cardiopulmonary abnormality seen.   Electronically Signed   By:  Marijo Conception, M.D.   On: 07/01/2015 12:48   Dg Knee Complete 4 Views Right  07/04/2015   CLINICAL DATA:  Seizure few days ago with severe bruising of the right knee.  EXAM: RIGHT KNEE - COMPLETE 4+ VIEW  COMPARISON:  June 28, 2014  FINDINGS: There is no evidence of fracture, dislocation, or joint effusion. There is mild chronic deformity of the proximal fibula unchanged. Soft tissues are unremarkable.  IMPRESSION: Negative.   Electronically Signed   By: Abelardo Diesel M.D.   On: 07/04/2015 11:52   Dg Knee Complete 4 Views Right  06/29/2015  CLINICAL DATA:  Unexplained right knee pain following a seizure  EXAM: RIGHT KNEE - COMPLETE 4+ VIEW  COMPARISON:  Thin un in PACs  FINDINGS: The bones are adequately mineralized. The joint spaces are preserved. There is no acute fracture or dislocation. There is no joint effusion. There is cortical thickening and irregularity associated with the meta diaphysis of the proximal fibula which likely reflects previous trauma. No acute fracture line is observed. There is popliteal artery calcification. There is calcification at the insertion of the quadriceps tendon and origin of the patellar tendons  IMPRESSION: No acute bony abnormality of the right knee is observed. If there are clinical concerns of significant soft tissue injury-internal derangement, MRI would be the most useful imaging step.   Electronically Signed   By: David  Martinique M.D.   On: 06/29/2015 07:30    Microbiology: Recent Results (from the past 240 hour(s))  MRSA PCR Screening     Status: None   Collection Time: 07/08/15  3:41 AM  Result Value Ref Range Status   MRSA by PCR NEGATIVE NEGATIVE Final    Comment:        The GeneXpert MRSA Assay (FDA approved for NASAL specimens only), is one component of a comprehensive MRSA colonization surveillance program. It is not intended to diagnose MRSA infection nor to guide or monitor treatment for MRSA infections.      Labs: Basic Metabolic  Panel:  Recent Labs Lab 07/07/15 1820 07/08/15 0304  07/08/15 1600 07/09/15 0544 07/10/15 0430 07/11/15 1613 07/12/15 0513  NA  --  129*  < > 131* 132* 132* 127* 128*  K  --  3.7  < > 3.9 3.4* 3.2* 4.0 4.4  CL  --  94*  < > 99* 99* 96* 92* 93*  CO2  --  24  --  25 27 28 29 27   GLUCOSE  --  120*  < > 110* 116* 111* 131* 121*  BUN  --  12  < > 15 12 9 8 8   CREATININE  --  0.93  < > 1.05 0.87 0.83 0.71 0.77  CALCIUM  --  8.7*  --  8.4* 8.5* 8.6* 8.8* 8.8*  MG 1.6* 1.6*  --  2.0  --  1.5*  --   --   < > = values in this interval not displayed. Liver Function Tests: No results for input(s): AST, ALT, ALKPHOS, BILITOT, PROT, ALBUMIN in the last 168 hours. No results for input(s): LIPASE, AMYLASE in the last 168 hours. No results for input(s): AMMONIA in the last 168 hours. CBC:  Recent Labs Lab 07/06/15 0319 07/08/15 0412 07/08/15 0737 07/09/15 0544  WBC 6.8  --  13.5* 9.2  NEUTROABS  --   --  12.3*  --   HGB 12.4* 10.9* 13.0 11.0*  HCT 36.5* 32.0* 38.9* 32.8*  MCV 86.7  --  87.0 87.5  PLT 296  --  385 330   Cardiac Enzymes:  Recent Labs Lab 07/08/15 0304 07/08/15 0737 07/08/15 1600  TROPONINI 0.07* 0.12* 0.15*   BNP: BNP (last 3 results) No results for input(s): BNP in the last 8760 hours.  ProBNP (last 3 results) No results for input(s): PROBNP in the last 8760 hours.  CBG: No results for input(s): GLUCAP in the last 168 hours.   Signed:  Velvet Bathe  [facility] 07/12/2015, 11:32 AM  Chart and Vitals reviewed. Patient stable for discharge to SNF

## 2015-07-12 NOTE — Care Management Important Message (Signed)
Important Message  Patient Details  Name: Francisco [name] MRN: [id number] Date of Birth: 18-Sep-1946   Medicare Important Message Given:  Yes-fourth notification given    Loann Quill 07/12/2015, 9:14 AM

## 2015-07-12 NOTE — Progress Notes (Addendum)
Patient ID: Francisco [name], male   DOB: Apr 12, 1946, 69 y.o.   MRN: [id number]    Patient Name: Francisco [name] Date of Encounter: 07/12/2015     Principal Problem:   Alcohol withdrawal seizure Active Problems:   HTN (hypertension)   Hyponatremia   Microcytic anemia   GERD (gastroesophageal reflux disease)   BPH (benign prostatic hypertrophy)   Vitamin D deficiency   Hyperlipidemia   Bilateral lower extremity edema (chronic)   Hypertrophic obstructive cardiomyopathy (HOCM)   Mild diastolic dysfunction   Acute pain of right knee   Transaminitis   Acute renal failure   High anion gap metabolic acidosis   Acute hypokalemia   Alcohol withdrawal   Acute renal failure syndrome   Hypokalemia   Hypomagnesemia   Absolute anemia   Hypertrophic cardiomyopathy   Diastolic CHF, acute on chronic   Essential hypertension   Chronic diastolic congestive heart failure   HLD (hyperlipidemia)   Cardiac arrest   Complete heart block   Seizures   Elevated troponin    SUBJECTIVE  C/o cough and chest pain with coughing.   CURRENT MEDS . atorvastatin  40 mg Oral q1800  . cholecalciferol  1,000 Units Oral Daily  . colchicine  0.6 mg Oral Daily  . dextromethorphan  30 mg Oral BID  . docusate sodium  100 mg Oral BID  . enoxaparin (LOVENOX) injection  40 mg Subcutaneous Q24H  . ferrous sulfate  325 mg Oral BID WC  . folic acid  1 mg Oral Daily  . levETIRAcetam  750 mg Oral BID  . metoprolol tartrate  25 mg Oral BID  . pantoprazole  40 mg Oral Daily  . potassium chloride  40 mEq Oral BID  . sodium chloride  3 mL Intravenous Q12H  . spironolactone  12.5 mg Oral BID  . tamsulosin  0.4 mg Oral Daily  . thiamine  100 mg Oral Daily  . vitamin B-12  250 mcg Oral Daily  . vitamin C  500 mg Oral BID    OBJECTIVE  Filed Vitals:   07/11/15 1820 07/11/15 2253 07/12/15 0445 07/12/15 0446  BP: 156/67 172/68 168/75   Pulse:  78 80   Temp:  98 F (36.7 C) 98.2 F (36.8 C)     TempSrc:  Oral Oral   Resp:  20 22   Height:  5' 9"  (1.753 m)    Weight:  224 lb 4.4 oz (101.73 kg)    SpO2:  95% 96% 100%    Intake/Output Summary (Last 24 hours) at 07/12/15 0850 Last data filed at 07/12/15 0600  Gross per 24 hour  Intake    103 ml  Output    950 ml  Net   -847 ml   Filed Weights   07/10/15 0400 07/10/15 2200 07/11/15 2253  Weight: 220 lb 7.4 oz (100 kg) 220 lb 7.4 oz (100 kg) 224 lb 4.4 oz (101.73 kg)    PHYSICAL EXAM  General: Pleasant, middle aged man, NAD. Neuro: Alert and oriented X 3. Moves all extremities spontaneously. Psych: Normal affect. HEENT:  Normal  Neck: Supple without bruits or JVD. Lungs:  Resp regular and unlabored, CTA. PM pocket with small/moderate hematoma Heart: RRR no s3, s4, or murmurs. Abdomen: Soft, non-tender, non-distended, BS + x 4.  Extremities: No clubbing, cyanosis or edema. DP/PT/Radials 2+ and equal bilaterally.  Accessory Clinical Findings  CBC No results for input(s): WBC, NEUTROABS, HGB, HCT, MCV, PLT in the last 72 hours. Basic Metabolic  Panel  Recent Labs  07/10/15 0430 07/11/15 1613 07/12/15 0513  NA 132* 127* 128*  K 3.2* 4.0 4.4  CL 96* 92* 93*  CO2 28 29 27   GLUCOSE 111* 131* 121*  BUN 9 8 8   CREATININE 0.83 0.71 0.77  CALCIUM 8.6* 8.8* 8.8*  MG 1.5*  --   --    Liver Function Tests No results for input(s): AST, ALT, ALKPHOS, BILITOT, PROT, ALBUMIN in the last 72 hours. No results for input(s): LIPASE, AMYLASE in the last 72 hours. Cardiac Enzymes No results for input(s): CKTOTAL, CKMB, CKMBINDEX, TROPONINI in the last 72 hours. BNP Invalid input(s): POCBNP D-Dimer No results for input(s): DDIMER in the last 72 hours. Hemoglobin A1C No results for input(s): HGBA1C in the last 72 hours. Fasting Lipid Panel No results for input(s): CHOL, HDL, LDLCALC, TRIG, CHOLHDL, LDLDIRECT in the last 72 hours. Thyroid Function Tests No results for input(s): TSH, T4TOTAL, T3FREE, THYROIDAB in the last  72 hours.  Invalid input(s): FREET3  TELE nsr with ventricular pacing  Radiology/Studies  Dg Chest 2 View  07/10/2015   CLINICAL DATA:  Cardiac catheterization placement on 07/09/2015  EXAM: CHEST  2 VIEW  COMPARISON:  Radiographic 07/03/2015  FINDINGS: Left-sided pacemaker with 2 continuous leads overlies normal cardiac silhouette. Stable mildly enlarged cardiac silhouette. There is mild interstitial edema pattern unchanged prior. Small bilateral pleural effusions are increased.  IMPRESSION: 1. Interval placement of pacemaker without complication. 2. Increased interstitial edema and small bilateral effusions.   Electronically Signed   By: Suzy Bouchard M.D.   On: 07/10/2015 08:35   Dg Chest 2 View  07/03/2015   CLINICAL DATA:  69 year old male with history of dyspnea. Former smoker (quit 2 years ago).  EXAM: CHEST  2 VIEW  COMPARISON:  Chest x-ray 07/01/2015.  FINDINGS: Mild diffuse peribronchial cuffing. Lung volumes are normal. No consolidative airspace disease. No pleural effusions. No pneumothorax. No pulmonary nodule or mass noted. Pulmonary vasculature and the cardiomediastinal silhouette are within normal limits. Atherosclerosis in the thoracic aorta.  IMPRESSION: 1. Mild diffuse peribronchial cuffing, suggestive of acute bronchitis. 2. Atherosclerosis.   Electronically Signed   By: Vinnie Langton M.D.   On: 07/03/2015 13:52   Ct Head Wo Contrast  06/29/2015   CLINICAL DATA:  Seizure.  Alcohol abuse.  EXAM: CT HEAD WITHOUT CONTRAST  TECHNIQUE: Contiguous axial images were obtained from the base of the skull through the vertex without intravenous contrast.  COMPARISON:  Head CT and brain MRI 11/19/2005  FINDINGS: No intracranial hemorrhage, mass effect, or midline shift. No hydrocephalus. The basilar cisterns are patent. No evidence of territorial infarct. No intracranial fluid collection. Generalized cerebral and cerebellar atrophy. Mild chronic small vessel ischemic change. Small lacunar  infarct in the left basal ganglia, unchanged. Calvarium is intact. Included paranasal sinuses and mastoid air cells are well aerated.  IMPRESSION: Atrophy and chronic small vessel ischemic change. No acute intracranial abnormality.   Electronically Signed   By: Jeb Levering M.D.   On: 06/29/2015 06:56   Dg Chest Port 1 View  07/01/2015   CLINICAL DATA:  Shortness of breath.  EXAM: PORTABLE CHEST - 1 VIEW  COMPARISON:  November 03, 2009.  FINDINGS: Hypoinflation of the lungs is noted. Mild cardiomegaly is noted, but this may simply be due to hypoinflated status. No pneumothorax or pleural effusion is noted. No acute pulmonary disease is noted. Bony thorax is intact.  IMPRESSION: Hypoinflation of the lungs. No acute cardiopulmonary abnormality seen.   Electronically  Signed   By: Marijo Conception, M.D.   On: 07/01/2015 12:48   Dg Knee Complete 4 Views Right  07/04/2015   CLINICAL DATA:  Seizure few days ago with severe bruising of the right knee.  EXAM: RIGHT KNEE - COMPLETE 4+ VIEW  COMPARISON:  June 28, 2014  FINDINGS: There is no evidence of fracture, dislocation, or joint effusion. There is mild chronic deformity of the proximal fibula unchanged. Soft tissues are unremarkable.  IMPRESSION: Negative.   Electronically Signed   By: Abelardo Diesel M.D.   On: 07/04/2015 11:52   Dg Knee Complete 4 Views Right  06/29/2015   CLINICAL DATA:  Unexplained right knee pain following a seizure  EXAM: RIGHT KNEE - COMPLETE 4+ VIEW  COMPARISON:  Thin un in PACs  FINDINGS: The bones are adequately mineralized. The joint spaces are preserved. There is no acute fracture or dislocation. There is no joint effusion. There is cortical thickening and irregularity associated with the meta diaphysis of the proximal fibula which likely reflects previous trauma. No acute fracture line is observed. There is popliteal artery calcification. There is calcification at the insertion of the quadriceps tendon and origin of the patellar  tendons  IMPRESSION: No acute bony abnormality of the right knee is observed. If there are clinical concerns of significant soft tissue injury-internal derangement, MRI would be the most useful imaging step.   Electronically Signed   By: David  Martinique M.D.   On: 06/29/2015 07:30    ASSESSMENT AND PLAN  1. CHB 2. S/p PPM with small/mod hematoma, normal PM function 3. ETOH abuse 4. Multiple medical problems 5. Cough 6. Diastolic heart failure Rec: advance activity. Stable for discharge from cardiology perspective. He appears close to euvolemic.  Gregg Taylor,M.D.  07/12/2015 8:50 AM

## 2015-07-12 NOTE — Clinical Social Work Note (Addendum)
CSW talked with patient and his sister Lavera Guise ([phone]), who was at the bedside regarding discharge plans. Also present in the room was Mrs. Rabek's husband and patient's brother Yvone Neu. CSW informed by Mrs. Rabek that patient is medically stable for discharge today per MD. CSW informed by sister that they have spoken with a friend regarding skilled facilities and prefer [facility]. Call made to Abigail Butts, admissions director at [facility] and they can take patient.  CSW noted that patient had not been send by PT since 8/30 (more recent PT note needed for insurance authorization) and physical therapist Earnest Bailey was paged and will see patient this afternoon. Family updated.  Patient seen by PT and [employer] representatives Amy Tama Gander and Cristobal Goldmann contacted and messages regarding authorization. CSW did not receive a call back. CSW will f/u with [employer] on 9/56/16.   Voris Tigert Givens, MSW, LCSW Licensed Clinical Social Worker [place] [phone]

## 2015-07-12 NOTE — Progress Notes (Signed)
Physical Therapy Treatment Patient Details Name: Francisco [name] MRN: [id number] DOB: Oct 16, 1946 Today's Date: 07/12/2015    History of Present Illness This is a 69 year old male with history of celiac disease, hypertension, chronic ongoing alcoholism, recent issues with bilateral lower extremity edema, prior duodenal ulcer with GI bleeding and symptomatic anemia, chronic microcytic anemia, mild transaminitis as an outpatient in the past 2 weeks, BPH, GERD, mild hypertrophic obstructive cardiomyopathy with associated mild diastolic dysfunction who presents after onset of seizure activity.  Complete heart block and now s/p permanent pacemaker placement    PT Comments    Mr. Xiang is very motivated to work on moving, Statistician; Agree with SNF for post-acute rehab to maximize independence and safety with mobility prior to dc home  Follow Up Recommendations  SNF     Equipment Recommendations  Rolling walker with 5" wheels (if doesn't have one)    Recommendations for Other Services       Precautions / Restrictions Precautions Precautions: Fall    Mobility  Bed Mobility Overal bed mobility: Needs Assistance Bed Mobility: Supine to Sit;Sit to Supine     Supine to sit: Supervision Sit to supine: Min assist;HOB elevated   General bed mobility comments: cues for safety   Transfers Overall transfer level: Needs assistance Equipment used: Rolling walker (2 wheeled) Transfers: Sit to/from Stand Sit to Stand: Mod assist         General transfer comment: cues for safety and (A) to power up into standing  Ambulation/Gait Ambulation/Gait assistance: Min assist Ambulation Distance (Feet): 40 Feet Assistive device: Rolling walker (2 wheeled) Gait Pattern/deviations: Step-through pattern;Ataxic;Wide base of support (decr trunk stability) Gait velocity: reduced Gait velocity interpretation: Below normal speed for age/gender General Gait Details: Decr stability on feet  with ataxic steps and truncal ataxia as well; cues to self-monitor for activity tolerance   Stairs Stairs: Yes          Wheelchair Mobility    Modified Rankin (Stroke Patients Only)       Balance             Standing balance-Leahy Scale: Poor Standing balance comment: heavy reliance on UE support                     Cognition Arousal/Alertness: Awake/alert Behavior During Therapy: Impulsive Overall Cognitive Status: Within Functional Limits for tasks assessed                      Exercises      General Comments        Pertinent Vitals/Pain Pain Assessment: 0-10 Pain Score: 4  Faces Pain Scale: Hurts little more Pain Location: chest pain with cough Pain Descriptors / Indicators: Aching Pain Intervention(s): Limited activity within patient's tolerance;Monitored during session;Repositioned    Home Living                      Prior Function            PT Goals (current goals can now be found in the care plan section) Acute Rehab PT Goals Patient Stated Goal: wants to walk better, be able to safely go up and down steps and go home soon PT Goal Formulation: With patient Time For Goal Achievement: 07/14/15 Potential to Achieve Goals: Good Progress towards PT goals: Progressing toward goals    Frequency  Min 3X/week    PT Plan Discharge plan needs to be updated    Co-evaluation  End of Session Equipment Utilized During Treatment: Gait belt Activity Tolerance: Patient tolerated treatment well Patient left: in bed;with call bell/phone within reach     Time: 3875-6433 PT Time Calculation (min) (ACUTE ONLY): 13 min  Charges:  $Gait Training: 8-22 mins                    G Codes:      Quin Hoop 07/12/2015, 2:43 PM  Roney [place], [place] Pager [phone] Office [phone]'

## 2015-07-13 ENCOUNTER — Encounter (HOSPITAL_COMMUNITY): Payer: Self-pay | Admitting: Cardiovascular Disease

## 2015-07-13 DIAGNOSIS — K219 Gastro-esophageal reflux disease without esophagitis: Secondary | ICD-10-CM | POA: Diagnosis not present

## 2015-07-13 DIAGNOSIS — N179 Acute kidney failure, unspecified: Secondary | ICD-10-CM | POA: Diagnosis not present

## 2015-07-13 DIAGNOSIS — R2689 Other abnormalities of gait and mobility: Secondary | ICD-10-CM | POA: Diagnosis not present

## 2015-07-13 DIAGNOSIS — I421 Obstructive hypertrophic cardiomyopathy: Secondary | ICD-10-CM | POA: Diagnosis not present

## 2015-07-13 DIAGNOSIS — M6281 Muscle weakness (generalized): Secondary | ICD-10-CM | POA: Diagnosis not present

## 2015-07-13 DIAGNOSIS — D649 Anemia, unspecified: Secondary | ICD-10-CM | POA: Diagnosis not present

## 2015-07-13 DIAGNOSIS — F10239 Alcohol dependence with withdrawal, unspecified: Secondary | ICD-10-CM | POA: Diagnosis not present

## 2015-07-13 DIAGNOSIS — I270 Primary pulmonary hypertension: Secondary | ICD-10-CM | POA: Diagnosis not present

## 2015-07-13 DIAGNOSIS — I5033 Acute on chronic diastolic (congestive) heart failure: Secondary | ICD-10-CM | POA: Diagnosis not present

## 2015-07-13 DIAGNOSIS — F1099 Alcohol use, unspecified with unspecified alcohol-induced disorder: Secondary | ICD-10-CM | POA: Diagnosis not present

## 2015-07-13 DIAGNOSIS — R569 Unspecified convulsions: Secondary | ICD-10-CM | POA: Diagnosis not present

## 2015-07-13 DIAGNOSIS — R600 Localized edema: Secondary | ICD-10-CM | POA: Diagnosis not present

## 2015-07-13 DIAGNOSIS — E785 Hyperlipidemia, unspecified: Secondary | ICD-10-CM | POA: Diagnosis not present

## 2015-07-13 DIAGNOSIS — I429 Cardiomyopathy, unspecified: Secondary | ICD-10-CM | POA: Diagnosis not present

## 2015-07-13 DIAGNOSIS — I10 Essential (primary) hypertension: Secondary | ICD-10-CM | POA: Diagnosis not present

## 2015-07-13 DIAGNOSIS — Z95818 Presence of other cardiac implants and grafts: Secondary | ICD-10-CM | POA: Insufficient documentation

## 2015-07-13 DIAGNOSIS — I5032 Chronic diastolic (congestive) heart failure: Secondary | ICD-10-CM | POA: Diagnosis not present

## 2015-07-13 DIAGNOSIS — Z959 Presence of cardiac and vascular implant and graft, unspecified: Secondary | ICD-10-CM

## 2015-07-13 DIAGNOSIS — G40909 Epilepsy, unspecified, not intractable, without status epilepticus: Secondary | ICD-10-CM | POA: Diagnosis not present

## 2015-07-13 DIAGNOSIS — I442 Atrioventricular block, complete: Secondary | ICD-10-CM | POA: Diagnosis not present

## 2015-07-13 LAB — BASIC METABOLIC PANEL
Anion gap: 9 (ref 5–15)
BUN: 7 mg/dL (ref 6–20)
CO2: 27 mmol/L (ref 22–32)
Calcium: 9.2 mg/dL (ref 8.9–10.3)
Chloride: 95 mmol/L — ABNORMAL LOW (ref 101–111)
Creatinine, Ser: 0.81 mg/dL (ref 0.61–1.24)
GFR calc Af Amer: >60 mL/min (ref >60)
GFR calc non Af Amer: >60 mL/min (ref >60)
Glucose, Bld: 107 mg/dL — ABNORMAL HIGH (ref 65–99)
Potassium: 4.2 mmol/L (ref 3.5–5.1)
Sodium: 131 mmol/L — ABNORMAL LOW (ref 135–145)

## 2015-07-13 NOTE — Care Management Note (Signed)
Case Management Note  Patient Details  Name: Francisco [name] MRN: [id number] Date of Birth: 1946/10/23  Subjective/Objective:          CM following for progression and d/c planning.          Action/Plan: 07/13/2015 Plan is for d/c to SNF today. No HH needs at this time.  Expected Discharge Date:       07/13/15           Expected Discharge Plan:  Skilled Nursing Facility  In-House Referral:  Clinical Social Work  Discharge planning Services  CM Consult  Post Acute Care Choice:    Choice offered to:  Patient  DME Arranged:    DME Agency:     HH Arranged:    [place] Agency:     Status of Service:  Completed, signed off  Medicare Important Message Given:  Yes-fourth notification given Date Medicare IM Given:    Medicare IM give by:    Date Additional Medicare IM Given:    Additional Medicare Important Message give by:     If discussed at [place] of Stay Meetings, dates discussed:    Additional Comments:  Adron Bene, RN 07/13/2015, 9:55 AM

## 2015-07-13 NOTE — Clinical Social Work Placement (Signed)
   CLINICAL SOCIAL WORK PLACEMENT  NOTE  Date:  07/13/2015  Patient Details  Name: Francisco [name] MRN: [id number] Date of Birth: August 18, 1946  Clinical Social Work is seeking post-discharge placement for this patient at the [place] level of care (*CSW will initial, date and re-position this form in  chart as items are completed):  Yes   Patient/family provided with [facility] Work Department's list of facilities offering this level of care within the geographic area requested by the patient (or if unable, by the patient's family).  Yes   Patient/family informed of their freedom to choose among providers that offer the needed level of care, that participate in Medicare, Medicaid or managed care program needed by the patient, have an available bed and are willing to accept the patient.  Yes   Patient/family informed of [facility]'s ownership interest in [facility] and [facility], as well as of the fact that they are under no obligation to receive care at these facilities.  PASRR submitted to EDS on 07/07/15     PASRR number received on 07/07/15     Existing PASRR number confirmed on       FL2 transmitted to all facilities in geographic area requested by pt/family on 07/06/15     FL2 transmitted to all facilities within larger geographic area on 07/07/15     Patient informed that his/her managed care company has contracts with or will negotiate with certain facilities, including the following:        Yes   Patient/family informed of bed offers received.  Patient chooses bed at [facility]     Physician recommends and patient chooses bed at      Patient to be transferred to [facility] on 07/13/15.  Patient to be transferred to facility by Ambulance Corey Harold)     Patient family notified on 07/13/15 of transfer.  Name of family member notified:  Sister Lavera Guise [phone])     PHYSICIAN Please  sign FL2     Additional Comment: 07/13/15 - Received insurance authorization from [employer] - #[id number].     _______________________________________________ Sable Feil, LCSW 07/13/2015, 2:04 PM

## 2015-07-14 ENCOUNTER — Telehealth: Payer: Self-pay | Admitting: *Deleted

## 2015-07-14 NOTE — Telephone Encounter (Signed)
Received fax from [facility] on 07/14/15   Date of admit: 06/29/15  Date of discharge: 07/13/15  Level of Care:Inpatient acute medical care  Discharging facility: [facility]  Attending physician: Cherrie Distance  PCP: Larena Glassman Dixon,PA/ Cletus Gash Pickard,MD  Discharge Dispostion: disharged/transferred to skilled nursing facility with Medicare certification  DX: P18.9- unspecified convulsions

## 2015-07-19 ENCOUNTER — Encounter: Payer: Self-pay | Admitting: Cardiology

## 2015-07-19 ENCOUNTER — Telehealth: Payer: Self-pay | Admitting: *Deleted

## 2015-07-19 ENCOUNTER — Ambulatory Visit: Payer: Commercial Managed Care - HMO

## 2015-07-19 NOTE — Telephone Encounter (Signed)
Submitted [employer] referral thru acuity connect for authorization on 07/16/15 to Dr. 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

## 2015-07-20 DIAGNOSIS — R569 Unspecified convulsions: Secondary | ICD-10-CM | POA: Diagnosis not present

## 2015-07-20 DIAGNOSIS — N179 Acute kidney failure, unspecified: Secondary | ICD-10-CM | POA: Diagnosis not present

## 2015-07-20 DIAGNOSIS — R600 Localized edema: Secondary | ICD-10-CM | POA: Diagnosis not present

## 2015-07-20 DIAGNOSIS — I10 Essential (primary) hypertension: Secondary | ICD-10-CM | POA: Diagnosis not present

## 2015-07-21 DIAGNOSIS — R569 Unspecified convulsions: Secondary | ICD-10-CM | POA: Diagnosis not present

## 2015-07-21 DIAGNOSIS — I10 Essential (primary) hypertension: Secondary | ICD-10-CM | POA: Diagnosis not present

## 2015-07-21 DIAGNOSIS — K219 Gastro-esophageal reflux disease without esophagitis: Secondary | ICD-10-CM | POA: Diagnosis not present

## 2015-07-21 DIAGNOSIS — E785 Hyperlipidemia, unspecified: Secondary | ICD-10-CM | POA: Diagnosis not present

## 2015-07-31 DIAGNOSIS — F10239 Alcohol dependence with withdrawal, unspecified: Secondary | ICD-10-CM | POA: Diagnosis not present

## 2015-07-31 DIAGNOSIS — S2249XD Multiple fractures of ribs, unspecified side, subsequent encounter for fracture with routine healing: Secondary | ICD-10-CM | POA: Diagnosis not present

## 2015-07-31 DIAGNOSIS — G4089 Other seizures: Secondary | ICD-10-CM | POA: Diagnosis not present

## 2015-07-31 DIAGNOSIS — I5033 Acute on chronic diastolic (congestive) heart failure: Secondary | ICD-10-CM | POA: Diagnosis not present

## 2015-07-31 DIAGNOSIS — M109 Gout, unspecified: Secondary | ICD-10-CM | POA: Diagnosis not present

## 2015-08-03 DIAGNOSIS — F10239 Alcohol dependence with withdrawal, unspecified: Secondary | ICD-10-CM | POA: Diagnosis not present

## 2015-08-03 DIAGNOSIS — I5033 Acute on chronic diastolic (congestive) heart failure: Secondary | ICD-10-CM | POA: Diagnosis not present

## 2015-08-03 DIAGNOSIS — G4089 Other seizures: Secondary | ICD-10-CM | POA: Diagnosis not present

## 2015-08-03 DIAGNOSIS — M109 Gout, unspecified: Secondary | ICD-10-CM | POA: Diagnosis not present

## 2015-08-03 DIAGNOSIS — S2249XD Multiple fractures of ribs, unspecified side, subsequent encounter for fracture with routine healing: Secondary | ICD-10-CM | POA: Diagnosis not present

## 2015-08-04 DIAGNOSIS — S2249XD Multiple fractures of ribs, unspecified side, subsequent encounter for fracture with routine healing: Secondary | ICD-10-CM | POA: Diagnosis not present

## 2015-08-04 DIAGNOSIS — M109 Gout, unspecified: Secondary | ICD-10-CM | POA: Diagnosis not present

## 2015-08-04 DIAGNOSIS — F10239 Alcohol dependence with withdrawal, unspecified: Secondary | ICD-10-CM | POA: Diagnosis not present

## 2015-08-04 DIAGNOSIS — G4089 Other seizures: Secondary | ICD-10-CM | POA: Diagnosis not present

## 2015-08-04 DIAGNOSIS — I5033 Acute on chronic diastolic (congestive) heart failure: Secondary | ICD-10-CM | POA: Diagnosis not present

## 2015-08-05 ENCOUNTER — Encounter: Payer: Self-pay | Admitting: Physician Assistant

## 2015-08-05 ENCOUNTER — Ambulatory Visit (INDEPENDENT_AMBULATORY_CARE_PROVIDER_SITE_OTHER): Payer: Commercial Managed Care - HMO | Admitting: Physician Assistant

## 2015-08-05 VITALS — BP 136/76 | HR 84 | Temp 98.0°F | Resp 20 | Wt 198.0 lb

## 2015-08-05 DIAGNOSIS — Z959 Presence of cardiac and vascular implant and graft, unspecified: Secondary | ICD-10-CM | POA: Diagnosis not present

## 2015-08-05 DIAGNOSIS — E876 Hypokalemia: Secondary | ICD-10-CM | POA: Diagnosis not present

## 2015-08-05 DIAGNOSIS — I469 Cardiac arrest, cause unspecified: Secondary | ICD-10-CM

## 2015-08-05 DIAGNOSIS — Z95818 Presence of other cardiac implants and grafts: Secondary | ICD-10-CM

## 2015-08-05 DIAGNOSIS — Z09 Encounter for follow-up examination after completed treatment for conditions other than malignant neoplasm: Secondary | ICD-10-CM | POA: Diagnosis not present

## 2015-08-05 DIAGNOSIS — E871 Hypo-osmolality and hyponatremia: Secondary | ICD-10-CM | POA: Diagnosis not present

## 2015-08-05 DIAGNOSIS — R569 Unspecified convulsions: Secondary | ICD-10-CM | POA: Diagnosis not present

## 2015-08-05 DIAGNOSIS — I442 Atrioventricular block, complete: Secondary | ICD-10-CM | POA: Diagnosis not present

## 2015-08-05 LAB — COMPLETE METABOLIC PANEL WITH GFR
ALT: 23 U/L (ref 9–46)
AST: 34 U/L (ref 10–35)
Albumin: 4.2 g/dL (ref 3.6–5.1)
Alkaline Phosphatase: 96 U/L (ref 40–115)
BUN: 10 mg/dL (ref 7–25)
CO2: 23 mmol/L (ref 20–31)
Calcium: 10 mg/dL (ref 8.6–10.3)
Chloride: 95 mmol/L — ABNORMAL LOW (ref 98–110)
Creat: 1.07 mg/dL (ref 0.70–1.25)
GFR, Est African American: 81 mL/min (ref >=60)
GFR, Est Non African American: 70 mL/min (ref >=60)
Glucose, Bld: 109 mg/dL — ABNORMAL HIGH (ref 70–99)
Potassium: 4.2 mmol/L (ref 3.5–5.3)
Sodium: 130 mmol/L — ABNORMAL LOW (ref 135–146)
Total Bilirubin: 0.8 mg/dL (ref 0.2–1.2)
Total Protein: 7.8 g/dL (ref 6.1–8.1)

## 2015-08-05 LAB — MAGNESIUM: Magnesium: 1.6 mg/dL (ref 1.5–2.5)

## 2015-08-05 NOTE — Progress Notes (Signed)
Patient ID: Francisco [name] MRN: [id number], DOB: Feb 19, 1946, 69 y.o. Date of Encounter: @DATE @  Chief Complaint:  Chief Complaint  Patient presents with  . hospital follow up    HPI: 69 y.o. year old white male  presents for f/u after recent hospitalization.   I have reviewed his entire discharge summary today. Hospitalized 06/29/15 through 07/12/15. As well he states that he went to [facility] after discharge from the hospital and just got home Friday.  He has no complaints or concerns today.  He states that he does not have a follow-up appointment scheduled with cardiology or neurology.  He reports that he has had no seizure activity since being discharged from the hospital.  THE FOLLOWING IS COPIED FROM THE DISCHARGE SUMMARY:  Recommendations for Outpatient Follow-up:   Patient will need follow-up with neurologist and cardiologist after hospital discharge  Required permanent pacemaker placement  Monitor sodium levels  Monitor magnesium levels last replaced 07/11/2015 with 1 g IV magnesium  Discharge Diagnoses:  Principal Problem:  Alcohol withdrawal seizure Active Problems:  HTN (hypertension)  Hyponatremia  Microcytic anemia  GERD (gastroesophageal reflux disease)  BPH (benign prostatic hypertrophy)  Vitamin D deficiency  Hyperlipidemia  Bilateral lower extremity edema (chronic)  Hypertrophic obstructive cardiomyopathy (HOCM)  Mild diastolic dysfunction  Acute pain of right knee  Transaminitis  Acute renal failure  High anion gap metabolic acidosis  Acute hypokalemia  Alcohol withdrawal  Acute renal failure syndrome  Hypokalemia  Hypomagnesemia  Absolute anemia  Hypertrophic cardiomyopathy  Diastolic CHF, acute on chronic  Essential hypertension  Chronic diastolic congestive heart failure  HLD (hyperlipidemia)  Cardiac arrest  Complete heart block  Seizures  Elevated troponin   History of present illness:   69 year old male with celiac disease, hypertension, mild hypertrophic obstructive cardiomyopathy/diastolic dysfunction, alcoholism, recent bilateral lower extremity edema, duodenal ulcer with GIB, chronic microcytic anemia, BPH, GERD,presented to [facility] ED for evaluation of seizure like activity. Pt has no recollection of seizure activity but per EMS report, pt appeared post ictal upon their arrival.  In the ER CT of the head was unremarkable.  Since admission patient has had some breakthrough seizures which resulted in patient having 2 code blues called. Also on further work up patient had heart block. Cardiology consulted. Transferred to floor from SDU 8/29, had increased joint pain and swelling of R knee, L ankle and R big toe, started on Gout Rx with NSAIDs and Colchicine, improving  Hospital Course:  Alcohol withdrawal seizure/ Alcohol abuse - Neurology consulted and recommended placing patient on [place] following up with urologist after discharge  Complete heart block - Cardiology on board while patient was in house - Permanent pacemaker placed  Right knee, L ankle and R great toe swelling/pain - suspect Gouty flare, no fever or leukocytosis, also bruising around R knee secondary to fall prior to admission - Risk factors for gout, ETOH and HCTZ  - Uric acid low but can be unreliable in Acute flare - continue colchicine  - stop HCTZ at discharge - Xrays unremarkable  Hyponatremia - secondary to alcohol abuse  - workup at this time would not be accurate given his use of multiple diuretics prior to admission - Stable, not low enough to account for seizure like activity  Acute renal failure  - pre renal in etiology, resolved   Mild transaminitis  - alcohol induced hepatitis, LFT's elevated in the pattern of alcohol related damage - resolved   Hypokalemia  - resolved  Hypomagnesemia  -  Replaced several times. Yesterday 07/11/2015 was given IV replacement -  Reassess as outpatient  Anemia of chronic disease, IDA -hb WNL now  B LE edema secondary to  - Resolving   HOCM - Diastolic CHF, chronic, grade I  - Echocardiogram; with normal EF - Beta blocker restarted and will be continued on discharge  HTN - reasonable inpatient control   GERD - continue PPI  HLD  - Continue statin on discharge  Procedures:  Permanent pacemaker placement  Consultations:  Neurology  Cardiology  I have also reviewed the remainder of the discharge summary today.   Past Medical History  Diagnosis Date  . Celiac disease   . Hypertension   . Heart murmur   . Shortness of breath   . GERD (gastroesophageal reflux disease)   . 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 Anemia   . Multiple duodenal ulcers   . Symptomatic Bradycardia     a. 07/2015 s/p MDT Advisa L DC PPM (Ser #: [id number]).     Home Meds: Outpatient Prescriptions Prior to Visit  Medication Sig Dispense Refill  . Cholecalciferol (VITAMIN D3) 1000 UNITS CAPS Take 1 capsule (1,000 Units total) by mouth daily. 30 capsule 11  . colchicine 0.6 MG tablet Take 1 tablet (0.6 mg total) by mouth daily. 30 tablet 0  . dextromethorphan (DELSYM) 30 MG/5ML liquid Take 5 mLs (30 mg total) by mouth 2 (two) times daily. 89 mL 0  . docusate sodium (COLACE) 100 MG capsule Take 1 capsule (100 mg total) by mouth 2 (two) times daily. 10 capsule 0  . ferrous sulfate 325 (65 FE) MG tablet Take 1 tablet (325 mg total) by mouth 2 (two) times daily with a meal. 60 tablet 0  . levETIRAcetam (KEPPRA) 750 MG tablet Take 1 tablet (750 mg total) by mouth 2 (two) times daily. 60 tablet 0  . metoprolol tartrate (LOPRESSOR) 25 MG tablet Take 1 tablet (25 mg total) by mouth 2 (two) times daily. 60 tablet 0  . pantoprazole (PROTONIX) 40 MG tablet TAKE 1 TABLET BY MOUTH TWICE DAILY BEFORE A MEAL 60 tablet 0  . pravastatin (PRAVACHOL) 40 MG tablet TAKE 1 TABLET BY MOUTH EVERY EVENING 90  tablet 0  . spironolactone (ALDACTONE) 25 MG tablet Take 0.5 tablets (12.5 mg total) by mouth 2 (two) times daily. 60 tablet 0  . tamsulosin (FLOMAX) 0.4 MG CAPS capsule Take 1 capsule (0.4 mg total) by mouth daily. 90 capsule 3   No facility-administered medications prior to visit.    Allergies:  Allergies  Allergen Reactions  . Gluten Meal Other (See Comments)    Celiac Disease    Social History   Social History  . Marital Status: Married    Spouse Name: N/A  . Number of Children: N/A  . Years of Education: N/A   Occupational History  . Not on file.   Social History Main Topics  . Smoking status: Former Smoker -- 0.50 packs/day for 50 years    Types: Cigarettes  . Smokeless tobacco: Former Systems developer  . Alcohol Use: 0.0 oz/week    1-2 Glasses of wine per week     Comment: ocassional  . Drug Use: No  . Sexual Activity: No   Other Topics Concern  . Not on file   Social History Narrative   Lives alone. --as of 11/2013   Smokes e-cig. Has nicotine in it. No real cigarette any more    Family History  Problem Relation Age of  Onset  . Hodgkin's lymphoma Father      Review of Systems:  See HPI for pertinent ROS. All other ROS negative.    Physical Exam: Blood pressure 136/76, pulse 84, temperature 98 F (36.7 C), temperature source Oral, resp. rate 20, weight 198 lb (89.812 kg)., Body mass index is 29.23 kg/(m^2). General: WNWD WM. Appears in no acute distress. Neck: Supple. No thyromegaly. No lymphadenopathy. Lungs: Clear bilaterally to auscultation without wheezes, rales, or rhonchi. Breathing is unlabored. Heart: RRR with S1 S2. No murmurs, rubs, or gallops. He still has steristrips over pacer site. Abdomen: Soft, non-tender, non-distended with normoactive bowel sounds. No hepatomegaly. No rebound/guarding. No obvious abdominal masses. Musculoskeletal:  Strength and tone normal for age. Extremities/Skin: Warm and dry.  Trace LE edema. Neuro: Alert and oriented X 3.  Moves all extremities spontaneously. Gait is normal. CNII-XII grossly in tact. Psych:  Responds to questions appropriately with a normal affect.     ASSESSMENT AND PLAN:  69 y.o. year old male with  1. Hospital discharge follow-up - Ambulatory referral to Cardiology - Ambulatory referral to Neurology  2. Hyponatremia - COMPLETE METABOLIC PANEL WITH GFR  3. Acute hypokalemia - COMPLETE METABOLIC PANEL WITH GFR  4. Hypokalemia - COMPLETE METABOLIC PANEL WITH GFR  5. Hypomagnesemia - COMPLETE METABOLIC PANEL WITH GFR - Magnesium  6. Cardiac arrest - Ambulatory referral to Cardiology  7. Complete heart block - Ambulatory referral to Cardiology  8. Seizures - Ambulatory referral to Neurology  9. Cardiac device in situ, other - Ambulatory referral to Cardiology   Signed, [facility] [place], [place], [facility] 08/05/2015 12:54 PM

## 2015-08-06 ENCOUNTER — Encounter: Payer: Self-pay | Admitting: Family Medicine

## 2015-08-06 DIAGNOSIS — F10239 Alcohol dependence with withdrawal, unspecified: Secondary | ICD-10-CM | POA: Diagnosis not present

## 2015-08-06 DIAGNOSIS — M109 Gout, unspecified: Secondary | ICD-10-CM | POA: Diagnosis not present

## 2015-08-06 DIAGNOSIS — I5033 Acute on chronic diastolic (congestive) heart failure: Secondary | ICD-10-CM | POA: Diagnosis not present

## 2015-08-06 DIAGNOSIS — G4089 Other seizures: Secondary | ICD-10-CM | POA: Diagnosis not present

## 2015-08-06 DIAGNOSIS — S2249XD Multiple fractures of ribs, unspecified side, subsequent encounter for fracture with routine healing: Secondary | ICD-10-CM | POA: Diagnosis not present

## 2015-08-09 ENCOUNTER — Telehealth: Payer: Self-pay | Admitting: Physician Assistant

## 2015-08-09 NOTE — Telephone Encounter (Signed)
Trying to contact patient.  [facility] does not do housework.  Insurance will not pay for that.

## 2015-08-09 NOTE — Telephone Encounter (Signed)
Patient called this morning requesting an order to be sent to [facility] to have someone come in and do a little housework in the mornings.

## 2015-08-10 DIAGNOSIS — S2249XD Multiple fractures of ribs, unspecified side, subsequent encounter for fracture with routine healing: Secondary | ICD-10-CM | POA: Diagnosis not present

## 2015-08-10 DIAGNOSIS — M109 Gout, unspecified: Secondary | ICD-10-CM | POA: Diagnosis not present

## 2015-08-10 DIAGNOSIS — I5033 Acute on chronic diastolic (congestive) heart failure: Secondary | ICD-10-CM | POA: Diagnosis not present

## 2015-08-10 DIAGNOSIS — F10239 Alcohol dependence with withdrawal, unspecified: Secondary | ICD-10-CM | POA: Diagnosis not present

## 2015-08-10 DIAGNOSIS — G4089 Other seizures: Secondary | ICD-10-CM | POA: Diagnosis not present

## 2015-08-13 DIAGNOSIS — M109 Gout, unspecified: Secondary | ICD-10-CM | POA: Diagnosis not present

## 2015-08-13 DIAGNOSIS — I5033 Acute on chronic diastolic (congestive) heart failure: Secondary | ICD-10-CM | POA: Diagnosis not present

## 2015-08-13 DIAGNOSIS — S2249XD Multiple fractures of ribs, unspecified side, subsequent encounter for fracture with routine healing: Secondary | ICD-10-CM | POA: Diagnosis not present

## 2015-08-13 DIAGNOSIS — F10239 Alcohol dependence with withdrawal, unspecified: Secondary | ICD-10-CM | POA: Diagnosis not present

## 2015-08-13 DIAGNOSIS — G4089 Other seizures: Secondary | ICD-10-CM | POA: Diagnosis not present

## 2015-08-16 ENCOUNTER — Ambulatory Visit (INDEPENDENT_AMBULATORY_CARE_PROVIDER_SITE_OTHER): Payer: Commercial Managed Care - HMO | Admitting: *Deleted

## 2015-08-16 ENCOUNTER — Encounter: Payer: Self-pay | Admitting: Cardiology

## 2015-08-16 DIAGNOSIS — I442 Atrioventricular block, complete: Secondary | ICD-10-CM

## 2015-08-16 LAB — CUP PACEART INCLINIC DEVICE CHECK
Battery Impedance: 100 Ohm
Battery Remaining Longevity: 121 mo
Battery Voltage: 2.79 V
Brady Statistic AP VP Percent: 2 %
Brady Statistic AP VS Percent: 0 %
Brady Statistic AS VP Percent: 98 %
Brady Statistic AS VS Percent: 0 %
Date Time Interrogation Session: 20161010115204
Lead Channel Impedance Value: 515 Ohm
Lead Channel Impedance Value: 599 Ohm
Lead Channel Pacing Threshold Amplitude: 0.5 V
Lead Channel Pacing Threshold Amplitude: 0.75 V
Lead Channel Pacing Threshold Pulse Width: 0.4 ms
Lead Channel Pacing Threshold Pulse Width: 0.4 ms
Lead Channel Sensing Intrinsic Amplitude: 4 mV
Lead Channel Setting Pacing Amplitude: 3.5 V
Lead Channel Setting Pacing Amplitude: 3.5 V
Lead Channel Setting Pacing Pulse Width: 0.4 ms
Lead Channel Setting Sensing Sensitivity: 4 mV

## 2015-08-16 NOTE — Progress Notes (Signed)
Wound check appointment (implanted 07/09/15 but patient was in rehab facility until recently). Steri-strips removed. Wound without redness or edema. Incision edges approximated, wound well healed. Normal device function. Thresholds, sensing, and impedances consistent with implant measurements. Device programmed at 3.5V for extra safety margin until 3 month visit. Histogram distribution appropriate for patient and level of activity. No mode switches or high ventricular rates noted. Patient educated about wound care, arm mobility, lifting restrictions. ROV with Dr. Curt Bears 10/20/15 at 10am.

## 2015-08-17 ENCOUNTER — Ambulatory Visit (INDEPENDENT_AMBULATORY_CARE_PROVIDER_SITE_OTHER): Payer: Commercial Managed Care - HMO | Admitting: Neurology

## 2015-08-17 ENCOUNTER — Encounter: Payer: Self-pay | Admitting: Neurology

## 2015-08-17 VITALS — BP 144/80 | HR 79 | Resp 20 | Wt 199.0 lb

## 2015-08-17 DIAGNOSIS — R2681 Unsteadiness on feet: Secondary | ICD-10-CM

## 2015-08-17 DIAGNOSIS — R569 Unspecified convulsions: Secondary | ICD-10-CM

## 2015-08-17 DIAGNOSIS — IMO0001 Reserved for inherently not codable concepts without codable children: Secondary | ICD-10-CM | POA: Insufficient documentation

## 2015-08-17 DIAGNOSIS — F1020 Alcohol dependence, uncomplicated: Secondary | ICD-10-CM

## 2015-08-17 DIAGNOSIS — I442 Atrioventricular block, complete: Secondary | ICD-10-CM

## 2015-08-17 MED ORDER — LEVETIRACETAM 750 MG PO TABS: 750.0000 mg | ORAL_TABLET | Freq: Two times a day (BID) | ORAL | Status: DC

## 2015-08-17 NOTE — Patient Instructions (Signed)
1. Continue Keppra 754m twice a day 2. Use cane at all times, continue with exercises 3. As per [place] driving laws, after an episode of loss of consciousness, one should not drive until 6 months event-free 4. Continue with reduction in alcohol intake 5. Follow-up in 3 months, call for any changes

## 2015-08-17 NOTE — Progress Notes (Signed)
NEUROLOGY CONSULTATION NOTE  Francisco [name] MRN: [id number] DOB: November 24, 1945  Referring provider: Dena Billet, PA-C Primary care provider: Dena Billet, PA-C   Reason for consult:  seizures  Thank you for your kind referral of [facility] for consultation of the above symptoms. Although his history is well known to you, please allow me to reiterate it for the purpose of our medical record. Records and images were personally reviewed where available.  HISTORY OF PRESENT ILLNESS: This is a 69 year old right-handed man with a history of hypertension, hyperlipidemia, celiac disease, alcoholism, in his usual state of health until he was brought to [facility] ER last 06/29/15 for seizure-like activity. The patient has no recollection of this, reporting that his last memory is standing on his deck to get air, then "being pushed" then waking up while he was in the OR. He states he was unaware he had any seizures, and he did not have any as far as he knew. Records from his hospital stay were reviewed. Per records, a witness at the residence reported he had fallen on the floor with seizure-like activity and that he had similar symptoms 1 year prior. He denies any prior history of seizures. He was noted by EMS to be post-ictal for 10-15 minutes upon their arrival, then oriented x 4 after. He reported drinking 1/4 gallon daily of vodka, last drink was the day prior. He was noted to have several electrolyte abnormalities, including hyponatremia (120), hypokalemia (2.9), hypochloremia (76), hypomagnesemia (1.3), creatinine 1.63, LFTs showed elevated AST 97, ALF 55. Alcohol level <5. He was admitted for suspected alcohol withdrawal seizure. Over the next few days, he became agitated then more confused. He started to improve cognitively with supportive care, then on 07/07/15 he had a witnessed seizure. He was noted to be pulseless, CPR was called and code blue activated. He came to after 3 minutes of CPR, very confused  initially. He was moved to stepdown where he was noted to be bradycardic down to the low 30s. He was given Atropine and Dopamine, then several minutes after, he had another convulsion and cardiac arrest. CPR performed for 3 minutes. He ultimately had a pacemaker placed for complete heart block. He was evaluated by Neurology, routine wake and drowsy EEG normal. I personally reviewed head CT without contrast which showed diffuse cerebral and cerebellar atrophy, mild chronic microvascular disease, no acute changes. He was started on Keppra 772m BID. He was discharged to a SNF and stayed there for a few days, and has been back home for the past 1-1/2 weeks. He tells me he let his roommate go and now lives alone. He denies any seizures, no side effects on Keppra 75070mBID. He denies any headaches, dizziness, diplopia, dysarthria/dysphagia, neck/back pain, focal numbness/tingling/weakness except for some numbness on his right inner thigh post surgery. He has some constipation. He denies any gaps in time,  olfactory/gustatory hallucinations, deja vu, rising epigastric sensation, myoclonic jerks. He had a normal birth and early development.  There is no history of febrile convulsions, CNS infections such as meningitis/encephalitis, significant traumatic brain injury, neurosurgical procedures, or family history of seizures. He reports reducing alcohol intake to 3-4 beers a day, previously drinking 6 beers a day and half a gallon of vodka over 3 days. He uses a cane to ambulate, stating he was told to use a walker but has progressed to using a cane. [facility]. He expresses that he wants to  live by himself independently and does not like asking other people for help. No falls.  PAST MEDICAL HISTORY: Past Medical History  Diagnosis Date  . Celiac disease   . Hypertension   . Heart murmur   . Shortness of breath   . GERD (gastroesophageal reflux disease)   . 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 Anemia   . Multiple duodenal ulcers   . Symptomatic Bradycardia     a. 07/2015 s/p MDT Advisa L DC PPM (Ser #: [id number]).    PAST SURGICAL HISTORY: Past Surgical History  Procedure Laterality Date  . Hemorrhoid surgery    . Mole removal    . Hernia repair    . Esophagogastroduodenoscopy  07/03/2012    Procedure: ESOPHAGOGASTRODUODENOSCOPY (EGD);  Surgeon: Winfield Cunas., MD;  Location: [facility] ENDOSCOPY;  Service: Endoscopy;  Laterality: N/A;  . Cardiac catheterization N/A 07/08/2015    Procedure: Left Heart Cath and Coronary Angiography;  Surgeon: Troy Sine, MD;  Location: [place] CV LAB;  Service: Cardiovascular;  Laterality: N/A;  . Cardiac catheterization N/A 07/08/2015    Procedure: Temporary Pacemaker;  Surgeon: Troy Sine, MD;  Location: [place] [facility];  Service: Cardiovascular;  Laterality: N/A;  . Cardiac catheterization N/A 07/09/2015    Procedure: Temporary Pacemaker;  Surgeon: Troy Sine, MD;  Location: [place] [facility];  Service: Cardiovascular;  Laterality: N/A;  . Ep implantable device N/A 07/09/2015    Procedure: Pacemaker Implant;  Surgeon: Will Meredith Leeds, MD;  Location: [place] [facility];  Service: Cardiovascular;  Laterality: N/A;    MEDICATIONS: Current Outpatient Prescriptions on File Prior to Visit  Medication Sig Dispense Refill  . Cholecalciferol (VITAMIN D3) 1000 UNITS CAPS Take 1 capsule (1,000 Units total) by mouth daily. 30 capsule 11  . colchicine 0.6 MG tablet Take 1 tablet (0.6 mg total) by mouth daily. 30 tablet 0  . dextromethorphan (DELSYM) 30 MG/5ML liquid Take 5 mLs (30 mg total) by mouth 2 (two) times daily. 89 mL 0  . docusate sodium (COLACE) 100 MG capsule Take 1 capsule (100 mg total) by mouth 2 (two) times daily. 10 capsule 0  . ferrous sulfate 325 (65 FE) MG tablet Take 1 tablet (325 mg total) by mouth 2 (two) times daily with a meal. 60 tablet 0  . levETIRAcetam (KEPPRA) 750 MG  tablet Take 1 tablet (750 mg total) by mouth 2 (two) times daily. 60 tablet 0  . metoprolol tartrate (LOPRESSOR) 25 MG tablet Take 1 tablet (25 mg total) by mouth 2 (two) times daily. 60 tablet 0  . pantoprazole (PROTONIX) 40 MG tablet TAKE 1 TABLET BY MOUTH TWICE DAILY BEFORE A MEAL 60 tablet 0  . pravastatin (PRAVACHOL) 40 MG tablet TAKE 1 TABLET BY MOUTH EVERY EVENING 90 tablet 0  . spironolactone (ALDACTONE) 25 MG tablet Take 0.5 tablets (12.5 mg total) by mouth 2 (two) times daily. 60 tablet 0  . tamsulosin (FLOMAX) 0.4 MG CAPS capsule Take 1 capsule (0.4 mg total) by mouth daily. 90 capsule 3   No current facility-administered medications on file prior to visit.    ALLERGIES: Allergies  Allergen Reactions  . Gluten Meal Other (See Comments)    Celiac Disease    FAMILY HISTORY: Family History  Problem Relation Age of Onset  . Hodgkin's lymphoma Father     SOCIAL HISTORY: Social History   Social History  . Marital Status: Married    Spouse Name: N/A  .  Number of Children: N/A  . Years of Education: N/A   Occupational History  . Not on file.   Social History Main Topics  . Smoking status: Former Smoker -- 0.50 packs/day for 50 years    Types: Cigarettes  . Smokeless tobacco: Former Systems developer  . Alcohol Use: 0.0 oz/week    1-2 Glasses of wine per week     Comment: ocassional  . Drug Use: No  . Sexual Activity: No   Other Topics Concern  . Not on file   Social History Narrative   Lives alone. --as of 11/2013   Smokes e-cig. Has nicotine in it. No real cigarette any more    REVIEW OF SYSTEMS: Constitutional: No fevers, chills, or sweats, no generalized fatigue, change in appetite Eyes: No visual changes, double vision, eye pain Ear, nose and throat: No hearing loss, ear pain, nasal congestion, sore throat Cardiovascular: No chest pain, palpitations Respiratory:  No shortness of breath at rest or with exertion, wheezes GastrointestinaI: No nausea, vomiting,  diarrhea, abdominal pain, fecal incontinence Genitourinary:  No dysuria, urinary retention or frequency Musculoskeletal:  No neck pain, back pain Integumentary: No rash, pruritus, skin lesions Neurological: as above Psychiatric: No depression, insomnia, anxiety Endocrine: No palpitations, fatigue, diaphoresis, mood swings, change in appetite, change in weight, increased thirst Hematologic/Lymphatic:  No anemia, purpura, petechiae. Allergic/Immunologic: no itchy/runny eyes, nasal congestion, recent allergic reactions, rashes  PHYSICAL EXAM: Filed Vitals:   08/17/15 0801  BP: 144/80  Pulse: 79  Resp: 20   General: No acute distress Head:  Normocephalic/atraumatic Eyes: Fundoscopic exam shows bilateral sharp discs, no vessel changes, exudates, or hemorrhages Neck: supple, no paraspinal tenderness, full range of motion Back: No paraspinal tenderness Heart: regular rate and rhythm Lungs: Clear to auscultation bilaterally. Vascular: No carotid bruits. Skin/Extremities: No rash, no edema Neurological Exam: Mental status: alert and oriented to person, place, and time, no dysarthria or aphasia, Fund of knowledge is appropriate.  Recent and remote memory are intact.  Attention and concentration are normal.    Able to name objects and repeat phrases. Cranial nerves: CN I: not tested CN II: pupils equal, round and reactive to light, visual fields intact, fundi unremarkable. CN III, IV, VI:  full range of motion, no nystagmus, no ptosis CN V: facial sensation intact CN VII: upper and lower face symmetric CN VIII: hearing intact to finger rub CN IX, X: gag intact, uvula midline CN XI: sternocleidomastoid and trapezius muscles intact CN XII: tongue midline Bulk & Tone: normal, no fasciculations. Motor: 5/5 throughout with no pronator drift. Sensation: intact to light touch, cold, pin, vibration and joint position sense.  No extinction to double simultaneous stimulation.  Romberg test  negative Deep Tendon Reflexes: +2 throughout except for absent ankle jerks bilaterally, no ankle clonus Plantar responses: downgoing bilaterally Cerebellar: no incoordination on finger to nose testing with mild left endpoint tremor Gait: wide-based, unsteady with cane Tremor: no resting tremor, mild action > postural tremor, right > left. No asterixis.  IMPRESSION: This is a 69 year old right-handed man with a history of hypertension, hyperlipidemia, celiac disease, alcoholism, initially admitted for a seizure last 06/29/15, felt to be due to alcohol withdrawal. He had some agitation and confusion for a few days which cleared up, then on 07/07/15 into 07/08/15 he had 2 generalized convulsions both with cardiac arrest requiring CPR. He was noted to be bradycardic then had complete heart block, and ultimately had a pacemaker placed. It was unclear if seizure was due to  cardiac cause or if the seizure caused the arrhythmia. He has no clear epilepsy risk factors, routine EEG normal. He was started on Keppra 738m BID and denies any further symptoms since hospital discharge last month. He has been home alone for the past 1-1/2 weeks, with note of gait instability. At this point, recommend continuation of Keppra 7561mBID. If seizures recur, would proceed with prolonged EEG. We discussed physical therapy for his gait, however he tells me he "sent them away." He knows to use his cane at all times. [facility]. We discussed continued reduction in alcohol intake. [place] driving laws were discussed with the patient, and he knows to stop driving after a seizure, until 6 months seizure-free. He will follow-up in 3 months and knows to call our office for any changes.  Thank you for allowing me to participate in the care of this patient. Please do not hesitate to call for any questions or concerns.   KaEllouise NewerM.D.

## 2015-08-18 DIAGNOSIS — I5033 Acute on chronic diastolic (congestive) heart failure: Secondary | ICD-10-CM | POA: Diagnosis not present

## 2015-08-18 DIAGNOSIS — M109 Gout, unspecified: Secondary | ICD-10-CM | POA: Diagnosis not present

## 2015-08-18 DIAGNOSIS — F10239 Alcohol dependence with withdrawal, unspecified: Secondary | ICD-10-CM | POA: Diagnosis not present

## 2015-08-18 DIAGNOSIS — S2249XD Multiple fractures of ribs, unspecified side, subsequent encounter for fracture with routine healing: Secondary | ICD-10-CM | POA: Diagnosis not present

## 2015-08-18 DIAGNOSIS — G4089 Other seizures: Secondary | ICD-10-CM | POA: Diagnosis not present

## 2015-08-31 ENCOUNTER — Other Ambulatory Visit: Payer: Self-pay | Admitting: Physician Assistant

## 2015-08-31 NOTE — Telephone Encounter (Signed)
Medication refilled per protocol. 

## 2015-10-14 ENCOUNTER — Telehealth: Payer: Self-pay | Admitting: *Deleted

## 2015-10-14 NOTE — Telephone Encounter (Signed)
Submitted [employer] referral thru acuity connect for authorization on 10/11/15 to Dr. 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

## 2015-10-20 ENCOUNTER — Encounter: Payer: Self-pay | Admitting: Cardiology

## 2015-10-20 ENCOUNTER — Ambulatory Visit (INDEPENDENT_AMBULATORY_CARE_PROVIDER_SITE_OTHER): Payer: Commercial Managed Care - HMO | Admitting: Cardiology

## 2015-10-20 VITALS — BP 114/60 | HR 81 | Ht 69.0 in | Wt 203.4 lb

## 2015-10-20 DIAGNOSIS — Z950 Presence of cardiac pacemaker: Secondary | ICD-10-CM | POA: Diagnosis not present

## 2015-10-20 DIAGNOSIS — Z959 Presence of cardiac and vascular implant and graft, unspecified: Secondary | ICD-10-CM

## 2015-10-20 DIAGNOSIS — I442 Atrioventricular block, complete: Secondary | ICD-10-CM

## 2015-10-20 DIAGNOSIS — Z95818 Presence of other cardiac implants and grafts: Secondary | ICD-10-CM

## 2015-10-20 LAB — CUP PACEART INCLINIC DEVICE CHECK
Battery Impedance: 100 Ohm
Battery Remaining Longevity: 143 mo
Battery Voltage: 2.79 V
Brady Statistic AP VP Percent: 5 %
Brady Statistic AP VS Percent: 0 %
Brady Statistic AS VP Percent: 95 %
Brady Statistic AS VS Percent: 0 %
Date Time Interrogation Session: 20161214154204
Implantable Lead Implant Date: 20160902
Implantable Lead Implant Date: 20160902
Implantable Lead Location: 753859
Implantable Lead Location: 753860
Implantable Lead Model: 5076
Implantable Lead Model: 5076
Lead Channel Impedance Value: 600 Ohm
Lead Channel Impedance Value: 672 Ohm
Lead Channel Pacing Threshold Amplitude: 0.5 V
Lead Channel Pacing Threshold Amplitude: 0.625 V
Lead Channel Pacing Threshold Amplitude: 1 V
Lead Channel Pacing Threshold Amplitude: 1.25 V
Lead Channel Pacing Threshold Pulse Width: 0.4 ms
Lead Channel Pacing Threshold Pulse Width: 0.4 ms
Lead Channel Pacing Threshold Pulse Width: 0.4 ms
Lead Channel Pacing Threshold Pulse Width: 0.4 ms
Lead Channel Sensing Intrinsic Amplitude: 4 mV
Lead Channel Sensing Intrinsic Amplitude: 5.6 mV
Lead Channel Setting Pacing Amplitude: 1.5 V
Lead Channel Setting Pacing Amplitude: 2.5 V
Lead Channel Setting Pacing Pulse Width: 0.4 ms
Lead Channel Setting Sensing Sensitivity: 4 mV

## 2015-10-20 NOTE — Progress Notes (Signed)
Electrophysiology Office Note   Date:  10/20/2015   ID:  Mansel, Strother October 10, 1946, MRN [id number]  PCP:  Karis Juba, PA-C  Primary Electrophysiologist:  Constance Haw, MD    Chief Complaint  Patient presents with  . Follow-up    Post implant     History of Present Illness: Francisco [name] is a 69 y.o. male who presents today for electrophysiology evaluation.    He presented to the hospital in September for the evaluation of seizures. At that time he was found to be in complete heart block with a ventricular rate in the 30s and altered mental mental status requiring transcutaneous pacing. He had an urgent temporary pacemaker placed which dislodged and ultimately had a dual chamber pacemaker placed. Since that time he has been doing well without any complaints.   Today, he denies symptoms of palpitations, chest pain, shortness of breath, orthopnea, PND, lower extremity edema, claudication, dizziness, presyncope, syncope, bleeding, or neurologic sequela. The patient is tolerating medications without difficulties and is otherwise without complaint today.    Past Medical History  Diagnosis Date  . Celiac disease   . Hypertension   . Heart murmur   . Shortness of breath   . GERD (gastroesophageal reflux disease)   . 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 Anemia   . Multiple duodenal ulcers   . Symptomatic Bradycardia     a. 07/2015 s/p MDT Advisa L DC PPM (Ser #: [id number]).   Past Surgical History  Procedure Laterality Date  . Hemorrhoid surgery    . Mole removal    . Hernia repair    . Esophagogastroduodenoscopy  07/03/2012    Procedure: ESOPHAGOGASTRODUODENOSCOPY (EGD);  Surgeon: Winfield Cunas., MD;  Location: [facility] ENDOSCOPY;  Service: Endoscopy;  Laterality: N/A;  . Cardiac catheterization N/A 07/08/2015    Procedure: Left Heart Cath and Coronary Angiography;  Surgeon: Troy Sine, MD;  Location: [place] [facility];   Service: Cardiovascular;  Laterality: N/A;  . Cardiac catheterization N/A 07/08/2015    Procedure: Temporary Pacemaker;  Surgeon: Troy Sine, MD;  Location: [place] [facility];  Service: Cardiovascular;  Laterality: N/A;  . Cardiac catheterization N/A 07/09/2015    Procedure: Temporary Pacemaker;  Surgeon: Troy Sine, MD;  Location: [place] [facility];  Service: Cardiovascular;  Laterality: N/A;  . Ep implantable device N/A 07/09/2015    Procedure: Pacemaker Implant;  Surgeon: Will Meredith Leeds, MD;  Location: [place] [facility];  Service: Cardiovascular;  Laterality: N/A;     Current Outpatient Prescriptions  Medication Sig Dispense Refill  . amLODipine (NORVASC) 5 MG tablet TAKE 1 TABLET(5 MG) BY MOUTH DAILY 90 tablet 1  . Cholecalciferol (VITAMIN D3) 1000 UNITS CAPS Take 1 capsule (1,000 Units total) by mouth daily. 30 capsule 11  . ferrous sulfate 325 (65 FE) MG tablet Take 1 tablet (325 mg total) by mouth 2 (two) times daily with a meal. 60 tablet 0  . furosemide (LASIX) 20 MG tablet TAKE 1 TABLET(20 MG) BY MOUTH DAILY 90 tablet 1  . levETIRAcetam (KEPPRA) 750 MG tablet Take 1 tablet (750 mg total) by mouth 2 (two) times daily. 180 tablet 3  . lisinopril-hydrochlorothiazide (PRINZIDE,ZESTORETIC) 20-25 MG tablet TAKE 1 TABLET BY MOUTH EVERY DAY 90 tablet 1  . pantoprazole (PROTONIX) 40 MG tablet TAKE 1 TABLET BY MOUTH TWICE DAILY BEFORE A MEAL 180 tablet 1  . potassium chloride (K-DUR) 10 MEQ tablet TAKE 1 TABLET(10  MEQ) BY MOUTH DAILY 90 tablet 1  . tamsulosin (FLOMAX) 0.4 MG CAPS capsule Take 1 capsule (0.4 mg total) by mouth daily. 90 capsule 3   No current facility-administered medications for this visit.    Allergies:   Gluten meal   Social History:  The patient  reports that he has quit smoking. His smoking use included Cigarettes. He has a 25 pack-year smoking history. He has quit using smokeless tobacco. He reports that he drinks alcohol. He reports that he does not use  illicit drugs.   Family History:  The patient's family history includes Hodgkin's lymphoma in his father.    ROS:  Please see the history of present illness.   All other systems are reviewed and negative.    PHYSICAL EXAM: VS:  BP 114/60 mmHg  Pulse 81  Ht 5' 9"  (1.753 m)  Wt 203 lb 6.4 oz (92.262 kg)  BMI 30.02 kg/m2 , BMI Body mass index is 30.02 kg/(m^2). GEN: Well nourished, well developed, in no acute distress HEENT: normal Neck: no JVD, carotid bruits, or masses Cardiac: RRR; no murmurs, rubs, or gallops,no edema  Respiratory:  clear to auscultation bilaterally, normal work of breathing GI: soft, nontender, nondistended, + BS MS: no deformity or atrophy Skin: warm and dry,  device pocket is well healed Neuro:  Strength and sensation are intact Psych: euthymic mood, full affect  EKG:  EKG is ordered today. The ekg ordered today shows A sense, V pace  Device interrogation is reviewed today in detail.  See PaceArt for details.   Recent Labs: 06/29/2015: TSH 1.477 07/09/2015: Hemoglobin 11.0*; Platelets 330 08/05/2015: ALT 23; BUN 10; Creat 1.07; Magnesium 1.6; Potassium 4.2; Sodium 130*    Lipid Panel     Component Value Date/Time   CHOL 201* 06/09/2015 1014   TRIG 59 06/09/2015 1014   HDL 124 06/09/2015 1014   CHOLHDL 1.6 06/09/2015 1014   VLDL 12 06/09/2015 1014   LDLCALC 65 06/09/2015 1014     Wt Readings from Last 3 Encounters:  10/20/15 203 lb 6.4 oz (92.262 kg)  08/17/15 199 lb (90.266 kg)  08/05/15 198 lb (89.812 kg)      Other studies Reviewed: Additional studies/ records that were reviewed today include: 07/08/15 Cath Review of the above records today demonstrates:  Complete heart block with ventricular rates in the low 30s requiring insertion of a transvenous pacemaker.  Normal coronary arteries.  Hyperdynamic LV function with an ejection fraction of a aproximately 65%.    ASSESSMENT AND PLAN:  1.  Complete heart block: patient presented to  the hospital with complete heart block. Received dual-chamber pacemaker which is currently functioning well without any problems. He is not having any complaints related to his pacemaker. We will therefore continue current management and see him back in clinic in 9 months.  2. Hypertension: well controlled, no changes necessary  Current medicines are reviewed at length with the patient today.   The patient does not have concerns regarding his medicines.  The following changes were made today:  none  Labs/ tests ordered today include:  No orders of the defined types were placed in this encounter.     Disposition:   FU with Will Camnitz 9 months  Signed, Will Meredith Leeds, MD  10/20/2015 10:01 AM     [facility] [address] [place] [address] [phone] (office) [phone] (fax)

## 2015-10-20 NOTE — Patient Instructions (Signed)
Medication Instructions:  Your physician recommends that you continue on your current medications as directed. Please refer to the Current Medication list given to you today.  Labwork: None ordered   Testing/Procedures: None  ordered  Follow-Up: Remote monitoring is used to monitor your Pacemaker of ICD from home. This monitoring reduces the number of office visits required to check your device to one time per year. It allows [place] to keep an eye on the functioning of your device to ensure it is working properly. You are scheduled for a device check from home on 01/19/16. You may send your transmission at any time that day. If you have a wireless device, the transmission will be sent automatically. After your physician reviews your transmission, you will receive a postcard with your next transmission date.  Your physician wants you to follow-up in: 9 months with Dr. Curt Bears. You will receive a reminder letter in the mail two months in advance. If you don't receive a letter, please call our office to schedule the follow-up appointment.  If you need a refill on your cardiac medications before your next appointment, please call your pharmacy.  Thank you for choosing [facility]!!   Trinidad Curet, RN [phone]

## 2015-12-02 ENCOUNTER — Telehealth: Payer: Self-pay | Admitting: Physician Assistant

## 2015-12-02 ENCOUNTER — Other Ambulatory Visit: Payer: Self-pay | Admitting: Physician Assistant

## 2015-12-02 MED ORDER — LISINOPRIL-HYDROCHLOROTHIAZIDE 20-25 MG PO TABS: 1.0000 | ORAL_TABLET | Freq: Every day | ORAL | Status: DC

## 2015-12-02 MED ORDER — TAMSULOSIN HCL 0.4 MG PO CAPS: 0.4000 mg | ORAL_CAPSULE | Freq: Every day | ORAL | Status: DC

## 2015-12-02 NOTE — Telephone Encounter (Signed)
Medication refilled per protocol. 

## 2015-12-02 NOTE — Telephone Encounter (Signed)
Refill appropriate and filled per protocol. 

## 2015-12-02 NOTE — Telephone Encounter (Signed)
Pt requests a refill of Lisinopril and Flomax.  [facility]  Pt' # [phone]

## 2015-12-06 ENCOUNTER — Encounter: Payer: Self-pay | Admitting: Neurology

## 2015-12-06 ENCOUNTER — Ambulatory Visit (INDEPENDENT_AMBULATORY_CARE_PROVIDER_SITE_OTHER): Payer: Commercial Managed Care - HMO | Admitting: Neurology

## 2015-12-06 VITALS — BP 110/62 | HR 83 | Resp 18 | Wt 207.0 lb

## 2015-12-06 DIAGNOSIS — I442 Atrioventricular block, complete: Secondary | ICD-10-CM | POA: Diagnosis not present

## 2015-12-06 DIAGNOSIS — R569 Unspecified convulsions: Secondary | ICD-10-CM

## 2015-12-06 DIAGNOSIS — R2681 Unsteadiness on feet: Secondary | ICD-10-CM | POA: Diagnosis not present

## 2015-12-06 DIAGNOSIS — IMO0001 Reserved for inherently not codable concepts without codable children: Secondary | ICD-10-CM

## 2015-12-06 DIAGNOSIS — F1020 Alcohol dependence, uncomplicated: Secondary | ICD-10-CM | POA: Diagnosis not present

## 2015-12-06 MED ORDER — LEVETIRACETAM ER 500 MG PO TB24: ORAL_TABLET | ORAL | Status: DC

## 2015-12-06 NOTE — Patient Instructions (Signed)
1. Schedule routine EEG for September, then follow-up visit after 2. Switch to extended-release form of Keppra, new prescription has been sent to your pharmacy for Keppra XR 529m: Take 2 tablets every morning 3. Follow-up in 8 months   Seizure Precautions: 1. If medication has been prescribed for you to prevent seizures, take it exactly as directed.  Do not stop taking the medicine without talking to your doctor first, even if you have not had a seizure in a long time.   2. Avoid activities in which a seizure would cause danger to yourself or to others.  Don't operate dangerous machinery, swim alone, or climb in high or dangerous places, such as on ladders, roofs, or girders.  Do not drive unless your doctor says you may.  3. If you have any warning that you may have a seizure, lay down in a safe place where you can't hurt yourself.    4.  No driving for 6 months from last seizure, as [facility]   Please refer to the following link on the ERedmonwebsite for more information: http://www.epilepsyfoundation.org/answerplace/Social/driving/drivingu.cfm   5.  Maintain good sleep hygiene. Minimize alcohol.  6.  Contact your doctor if you have any problems that may be related to the medicine you are taking.  7.  Call 911 and bring the patient back to the ED if:        A.  The seizure lasts longer than 5 minutes.       B.  The patient doesn't awaken shortly after the seizure  C.  The patient has new problems such as difficulty seeing, speaking or moving  D.  The patient was injured during the seizure  E.  The patient has a temperature over 102 F (39C)  F.  The patient vomited and now is having trouble breathing

## 2015-12-06 NOTE — Progress Notes (Signed)
NEUROLOGY FOLLOW UP OFFICE NOTE  [place] [id number]  HISTORY OF PRESENT ILLNESS: I had the pleasure of seeing Sanchez Hemmer in follow-up in the neurology clinic on 12/06/2015.  The patient was last seen 3 months ago after he had 3 seizures, the first seizure was concerning for alcohol withdrawal, with the second and third seizures occurred the same day in the hospital, he was noted to be asystolic. He underwent pacemaker placement for complete heart block. Last seizure was while in the hospital on 07/07/15. He was discharged home on Levetiracetam 773m BID, but has only been taking it once a day because he "only takes all my medications in the morning." He denies any further seizures or seizure-like symptoms. He denies any olfactory/gustatory hallucinations, myoclonic jerks, focal numbness/tingling/weakness. His gait continues to improve, still with some balance issues, but he is not using his cane anymore. No falls. He had noticed some speech and pronunciation difficulties after his hospital stay, this is improving slowly as well, he has been reading out loud. He denies any headaches, dizziness, bowel/bladder dysfunction. He reports he continues to have " a few beers."  HPI: This is a 70yo RH man with a history of hypertension, hyperlipidemia, celiac disease, alcoholism, in his usual state of health until [facility]-like activity. The patient has no recollection of this, reporting that his last memory is standing on his deck to get air, then "being pushed" then waking up while he was in the OR. He states he was unaware he had any seizures, and he did not have any as far as he knew. Records from his hospital stay were reviewed. Per records, a witness at the residence reported he had fallen on the floor with seizure-like activity and that he had similar symptoms 1 year prior. He denies any prior history of seizures. He was noted by EMS to be post-ictal for  10-15 minutes upon their arrival, then oriented x 4 after. He reported drinking 1/4 gallon daily of vodka, last drink was the day prior. He was noted to have several electrolyte abnormalities, including hyponatremia (120), hypokalemia (2.9), hypochloremia (76), hypomagnesemia (1.3), creatinine 1.63, LFTs showed elevated AST 97, ALF 55. Alcohol level <5. He was admitted for suspected alcohol withdrawal seizure. Over the next few days, he became agitated then more confused. He started to improve cognitively with supportive care, then on 07/07/15 he had a witnessed seizure. He was noted to be pulseless, CPR was called and code blue activated. He came to after 3 minutes of CPR, very confused initially. He was moved to stepdown where he was noted to be bradycardic down to the low 30s. He was given Atropine and Dopamine, then several minutes after, he had another convulsion and cardiac arrest. CPR performed for 3 minutes. He ultimately had a pacemaker placed for complete heart block. He was evaluated by Neurology, routine wake and drowsy EEG normal. I personally reviewed head CT without contrast which showed diffuse cerebral and cerebellar atrophy, mild chronic microvascular disease, no acute changes. He was started on Keppra 7575mBID. He was discharged to a SNF and stayed there for a few days, and has been back home for the past 1-1/2 weeks. He tells me he let his roommate go and now lives alone. He denies any seizures, no side effects on Keppra 75067mID.  He reports reducing alcohol intake to 3-4 beers a day, previously drinking 6 beers a day and half a gallon  of vodka over 3 days. He uses a cane to ambulate, stating he was told to use a walker but has progressed to using a cane. [facility]. He expresses that he wants to live by himself independently and does not like asking other people for help.   Epilepsy Risk Factors: He had a normal birth and early development. There is no history of  febrile convulsions, CNS infections such as meningitis/encephalitis, significant traumatic brain injury, neurosurgical procedures, or family history of seizures.  PAST MEDICAL HISTORY: Past Medical History  Diagnosis Date  . Celiac disease   . Hypertension   . Heart murmur   . Shortness of breath   . GERD (gastroesophageal reflux disease)   . 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 Anemia   . Multiple duodenal ulcers   . Symptomatic Bradycardia     a. 07/2015 s/p MDT Advisa L DC PPM (Ser #: [id number]).    MEDICATIONS: Current Outpatient Prescriptions on File Prior to Visit  Medication Sig Dispense Refill  . amLODipine (NORVASC) 5 MG tablet TAKE 1 TABLET(5 MG) BY MOUTH DAILY 90 tablet 1  . Cholecalciferol (VITAMIN D3) 1000 UNITS CAPS Take 1 capsule (1,000 Units total) by mouth daily. 30 capsule 11  . ferrous sulfate 325 (65 FE) MG tablet Take 1 tablet (325 mg total) by mouth 2 (two) times daily with a meal. 60 tablet 0  . furosemide (LASIX) 20 MG tablet TAKE 1 TABLET(20 MG) BY MOUTH DAILY 90 tablet 1  . levETIRAcetam (KEPPRA) 750 MG tablet Take 1 tablet (750 mg total) by mouth 2 (two) times daily. (Patient taking differently: Take 750 mg by mouth daily. ) 180 tablet 3  . lisinopril-hydrochlorothiazide (PRINZIDE,ZESTORETIC) 20-25 MG tablet Take 1 tablet by mouth daily. 90 tablet 0  . pantoprazole (PROTONIX) 40 MG tablet TAKE 1 TABLET BY MOUTH TWICE DAILY BEFORE A MEAL 180 tablet 1  . potassium chloride (K-DUR) 10 MEQ tablet TAKE 1 TABLET(10 MEQ) BY MOUTH DAILY 90 tablet 1  . tamsulosin (FLOMAX) 0.4 MG CAPS capsule Take 1 capsule (0.4 mg total) by mouth daily. 90 capsule 0  . clotrimazole-betamethasone (LOTRISONE) cream APPLY EXTERNALLY TO THE AFFECTED AREA TWICE DAILY (Patient not taking: Reported on 12/06/2015) 45 g 1   No current facility-administered medications on file prior to visit.    ALLERGIES: Allergies  Allergen Reactions  . Gluten Meal Other  (See Comments)    Celiac Disease    FAMILY HISTORY: Family History  Problem Relation Age of Onset  . Hodgkin's lymphoma Father     SOCIAL HISTORY: Social History   Social History  . Marital Status: Married    Spouse Name: N/A  . Number of Children: N/A  . Years of Education: N/A   Occupational History  . Not on file.   Social History Main Topics  . Smoking status: Former Smoker -- 0.50 packs/day for 50 years    Types: Cigarettes  . Smokeless tobacco: Former Systems developer  . Alcohol Use: 0.0 oz/week    1-2 Glasses of wine per week     Comment: ocassional  . Drug Use: No  . Sexual Activity: No   Other Topics Concern  . Not on file   Social History Narrative   Lives alone. --as of 11/2013   Smokes e-cig. Has nicotine in it. No real cigarette any more    REVIEW OF SYSTEMS: Constitutional: No fevers, chills, or sweats, no generalized fatigue, change in appetite Eyes:  No visual changes, double vision, eye pain Ear, nose and throat: No hearing loss, ear pain, nasal congestion, sore throat Cardiovascular: No chest pain, palpitations Respiratory:  No shortness of breath at rest or with exertion, wheezes GastrointestinaI: No nausea, vomiting, diarrhea, abdominal pain, fecal incontinence Genitourinary:  No dysuria, urinary retention or frequency Musculoskeletal:  No neck pain, back pain Integumentary: No rash, pruritus, skin lesions Neurological: as above Psychiatric: No depression, insomnia, anxiety Endocrine: No palpitations, fatigue, diaphoresis, mood swings, change in appetite, change in weight, increased thirst Hematologic/Lymphatic:  No anemia, purpura, petechiae. Allergic/Immunologic: no itchy/runny eyes, nasal congestion, recent allergic reactions, rashes  PHYSICAL EXAM: Filed Vitals:   12/06/15 0958  BP: 110/62  Pulse: 83  Resp: 18   General: No acute distress Head:  Normocephalic/atraumatic Neck: supple, no paraspinal tenderness, full range of motion Heart:   Regular rate and rhythm Lungs:  Clear to auscultation bilaterally Back: No paraspinal tenderness Skin/Extremities: No rash, no edema Neurological Exam: alert and oriented to person, place, and time. No aphasia or dysarthria. Fund of knowledge is appropriate.  Recent and remote memory are intact.  Attention and concentration are normal.    Able to name objects and repeat phrases. Cranial nerves: Pupils equal, round, reactive to light. Extraocular movements intact with no nystagmus. Visual fields full. Facial sensation intact. No facial asymmetry. Tongue, uvula, palate midline.  Motor: Bulk and tone normal, muscle strength 5/5 throughout with no pronator drift.  Sensation to light touch intact.  No extinction to double simultaneous stimulation.  Deep tendon reflexes 2+ throughout, toes downgoing.  Finger to nose testing intact.  Gait wide-based and cautious, no ataxia, +Romberg test. +bilateral endpoint tremor, +side to side head tremor  IMPRESSION: This is a 70 yo RH man with a history of hypertension, hyperlipidemia, celiac disease, alcoholism, initially admitted for a seizure last 06/29/15, felt to be due to alcohol withdrawal. He had some agitation and confusion for a few days which cleared up, then on 07/07/15 into 07/08/15 he had 2 generalized convulsions both with cardiac arrest requiring CPR. He was noted to be bradycardic then had complete heart block, and ultimately had a pacemaker placed. It was unclear if seizure was due to cardiac cause or if the seizure caused the arrhythmia. He has no clear epilepsy risk factors, routine EEG normal. He was started on Keppra 731m BID and denies any further seizures, but has only been taking it once a day. We discussed continuation of seizure medication for now. He will switch to Levetiracetam ER 10018mdaily. He would like to discontinue medication, we discussed doing an EEG a year from the seizures, if normal, we will consider tapering off medication. We again  discussed [place] driving laws, he knows to stop driving after a seizure, until 6 months seizure-free. He will follow-up in 8 months and knows to call our office for any changes.  Thank you for allowing me to participate in his care.  Please do not hesitate to call for any questions or concerns.  The duration of this appointment visit was 24 minutes of face-to-face time with the patient.  Greater than 50% of this time was spent in counseling, explanation of diagnosis, planning of further management, and coordination of care.   KaEllouise NewerM.D.   CC: MaDena Billet

## 2015-12-13 ENCOUNTER — Encounter: Payer: Self-pay | Admitting: Physician Assistant

## 2015-12-13 ENCOUNTER — Ambulatory Visit (INDEPENDENT_AMBULATORY_CARE_PROVIDER_SITE_OTHER): Payer: Commercial Managed Care - HMO | Admitting: Physician Assistant

## 2015-12-13 VITALS — BP 130/70 | HR 80 | Temp 97.9°F | Resp 20 | Wt 207.0 lb

## 2015-12-13 DIAGNOSIS — I10 Essential (primary) hypertension: Secondary | ICD-10-CM | POA: Diagnosis not present

## 2015-12-13 DIAGNOSIS — N400 Enlarged prostate without lower urinary tract symptoms: Secondary | ICD-10-CM

## 2015-12-13 DIAGNOSIS — K264 Chronic or unspecified duodenal ulcer with hemorrhage: Secondary | ICD-10-CM

## 2015-12-13 DIAGNOSIS — M109 Gout, unspecified: Secondary | ICD-10-CM

## 2015-12-13 DIAGNOSIS — K900 Celiac disease: Secondary | ICD-10-CM | POA: Diagnosis not present

## 2015-12-13 DIAGNOSIS — E785 Hyperlipidemia, unspecified: Secondary | ICD-10-CM | POA: Diagnosis not present

## 2015-12-13 DIAGNOSIS — I421 Obstructive hypertrophic cardiomyopathy: Secondary | ICD-10-CM | POA: Diagnosis not present

## 2015-12-13 DIAGNOSIS — K2981 Duodenitis with bleeding: Secondary | ICD-10-CM

## 2015-12-13 DIAGNOSIS — R600 Localized edema: Secondary | ICD-10-CM | POA: Diagnosis not present

## 2015-12-13 DIAGNOSIS — E559 Vitamin D deficiency, unspecified: Secondary | ICD-10-CM | POA: Diagnosis not present

## 2015-12-13 DIAGNOSIS — R569 Unspecified convulsions: Secondary | ICD-10-CM | POA: Diagnosis not present

## 2015-12-13 DIAGNOSIS — IMO0001 Reserved for inherently not codable concepts without codable children: Secondary | ICD-10-CM

## 2015-12-13 DIAGNOSIS — R739 Hyperglycemia, unspecified: Secondary | ICD-10-CM | POA: Diagnosis not present

## 2015-12-13 DIAGNOSIS — R609 Edema, unspecified: Secondary | ICD-10-CM | POA: Diagnosis not present

## 2015-12-13 DIAGNOSIS — F1020 Alcohol dependence, uncomplicated: Secondary | ICD-10-CM

## 2015-12-13 DIAGNOSIS — F1010 Alcohol abuse, uncomplicated: Secondary | ICD-10-CM

## 2015-12-13 DIAGNOSIS — K269 Duodenal ulcer, unspecified as acute or chronic, without hemorrhage or perforation: Secondary | ICD-10-CM

## 2015-12-13 DIAGNOSIS — R7309 Other abnormal glucose: Secondary | ICD-10-CM | POA: Diagnosis not present

## 2015-12-13 DIAGNOSIS — F10239 Alcohol dependence with withdrawal, unspecified: Secondary | ICD-10-CM

## 2015-12-13 DIAGNOSIS — Z959 Presence of cardiac and vascular implant and graft, unspecified: Secondary | ICD-10-CM | POA: Diagnosis not present

## 2015-12-13 DIAGNOSIS — I442 Atrioventricular block, complete: Secondary | ICD-10-CM | POA: Diagnosis not present

## 2015-12-13 DIAGNOSIS — Z95818 Presence of other cardiac implants and grafts: Secondary | ICD-10-CM

## 2015-12-13 LAB — CBC WITH DIFFERENTIAL/PLATELET
Basophils Absolute: 0 10*3/uL (ref 0.0–0.1)
Basophils Relative: 0 % (ref 0–1)
Eosinophils Absolute: 0.1 10*3/uL (ref 0.0–0.7)
Eosinophils Relative: 1 % (ref 0–5)
HCT: 41.8 % (ref 39.0–52.0)
Hemoglobin: 14.1 g/dL (ref 13.0–17.0)
Lymphocytes Relative: 15 % (ref 12–46)
Lymphs Abs: 1.1 10*3/uL (ref 0.7–4.0)
MCH: 29.9 pg (ref 26.0–34.0)
MCHC: 33.7 g/dL (ref 30.0–36.0)
MCV: 88.7 fL (ref 78.0–100.0)
MPV: 10.7 fL (ref 8.6–12.4)
Monocytes Absolute: 0.6 10*3/uL (ref 0.1–1.0)
Monocytes Relative: 9 % (ref 3–12)
Neutro Abs: 5.3 10*3/uL (ref 1.7–7.7)
Neutrophils Relative %: 75 % (ref 43–77)
Platelets: 373 10*3/uL (ref 150–400)
RBC: 4.71 MIL/uL (ref 4.22–5.81)
RDW: 14.7 % (ref 11.5–15.5)
WBC: 7.1 10*3/uL (ref 4.0–10.5)

## 2015-12-13 LAB — COMPLETE METABOLIC PANEL WITH GFR
ALT: 13 U/L (ref 9–46)
AST: 28 U/L (ref 10–35)
Albumin: 4.2 g/dL (ref 3.6–5.1)
Alkaline Phosphatase: 52 U/L (ref 40–115)
BUN: 16 mg/dL (ref 7–25)
CO2: 28 mmol/L (ref 20–31)
Calcium: 9.6 mg/dL (ref 8.6–10.3)
Chloride: 85 mmol/L — ABNORMAL LOW (ref 98–110)
Creat: 0.99 mg/dL (ref 0.70–1.18)
GFR, Est African American: 89 mL/min (ref >=60)
GFR, Est Non African American: 77 mL/min (ref >=60)
Glucose, Bld: 115 mg/dL — ABNORMAL HIGH (ref 70–99)
Potassium: 4.2 mmol/L (ref 3.5–5.3)
Sodium: 125 mmol/L — ABNORMAL LOW (ref 135–146)
Total Bilirubin: 0.8 mg/dL (ref 0.2–1.2)
Total Protein: 7.6 g/dL (ref 6.1–8.1)

## 2015-12-13 LAB — URIC ACID: Uric Acid, Serum: 11.4 mg/dL — ABNORMAL HIGH (ref 4.0–7.8)

## 2015-12-13 NOTE — Progress Notes (Signed)
Patient ID: Francisco [name] MRN: [id number], DOB: [birth year], 70 y.o. Date of Encounter: @DATE @  Chief Complaint:  Chief Complaint  Patient presents with  . gout flare up    rt big toe  . Medication Refill    HPI: 69 y.o. year old white male  presents for routine follow-up.  Regarding the comment above-- regarding gout-- asked him if he was having problems with this right now. He states that it has resolved. However he says that recently his right first toe had redness and pain that lasted about 12 days but has resolved for the past week. Asked if he had ever seen a doctor when he was having a flare of this to know that it was gout and he says that is what they told him it Francisco [name]. Later when I was reviewing his hospital discharge summary I did see that at the time of that hospitalization he had what was consistent with gout flare that that time his uric acid level was low--but reviewed that  level  can be low during an acute flare.  I reviewed his extensive medical history.  Asked about his current alcohol intake. His response that was, "I drink right much last night----last night I went a little overboard--with the Super Bowl" Asked if he was watching the game by himself or with others. Says that he was watching it alone. Is that he drank about 12 or 13 beers. Says that he was drinking no other additional types of alcohol.  Says that most days is just 5 or 6 beers. Says "I could quit any time if I needed to". Asked if getting a pacemaker and having seizures would be an indication to quit. His responses that he "likes the taste of beer and he probably won't quit until he dies."  Other than reporting recent symptoms of possible gout flare he has no other complaints or concerns today.   Past Medical History  Diagnosis Date  . Celiac disease   . Hypertension   . Heart murmur   . Shortness of breath   . GERD (gastroesophageal reflux disease)   . 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 Anemia   . Multiple duodenal ulcers   . Symptomatic Bradycardia     a. 07/2015 s/p MDT Advisa L DC PPM (Ser #: [id number]).     Home Meds: Outpatient Prescriptions Prior to Visit  Medication Sig Dispense Refill  . amLODipine (NORVASC) 5 MG tablet TAKE 1 TABLET(5 MG) BY MOUTH DAILY 90 tablet 1  . Cholecalciferol (VITAMIN D3) 1000 UNITS CAPS Take 1 capsule (1,000 Units total) by mouth daily. 30 capsule 11  . clotrimazole-betamethasone (LOTRISONE) cream APPLY EXTERNALLY TO THE AFFECTED AREA TWICE DAILY 45 g 1  . ferrous sulfate 325 (65 FE) MG tablet Take 1 tablet (325 mg total) by mouth 2 (two) times daily with a meal. 60 tablet 0  . furosemide (LASIX) 20 MG tablet TAKE 1 TABLET(20 MG) BY MOUTH DAILY 90 tablet 1  . levETIRAcetam (KEPPRA XR) 500 MG 24 hr tablet Take 2 tablets every morning 60 tablet 11  . lisinopril-hydrochlorothiazide (PRINZIDE,ZESTORETIC) 20-25 MG tablet Take 1 tablet by mouth daily. 90 tablet 0  . pantoprazole (PROTONIX) 40 MG tablet TAKE 1 TABLET BY MOUTH TWICE DAILY BEFORE A MEAL 180 tablet 1  . potassium chloride (K-DUR) 10 MEQ tablet TAKE 1 TABLET(10 MEQ) BY MOUTH DAILY 90 tablet 1  . tamsulosin (FLOMAX) 0.4 MG CAPS capsule Take 1 capsule (0.4  mg total) by mouth daily. 90 capsule 0   No facility-administered medications prior to visit.    Allergies:  Allergies  Allergen Reactions  . Gluten Meal Other (See Comments)    Celiac Disease    Social History   Social History  . Marital Status: Married    Spouse Name: N/A  . Number of Children: N/A  . Years of Education: N/A   Occupational History  . Not on file.   Social History Main Topics  . Smoking status: Former Smoker -- 0.50 packs/day for 50 years    Types: Cigarettes  . Smokeless tobacco: Former Systems developer  . Alcohol Use: 0.0 oz/week    1-2 Glasses of wine per week     Comment: ocassional  . Drug Use: No  . Sexual Activity: No   Other Topics Concern  . Not on  file   Social History Narrative   Lives alone. --as of 11/2013   Smokes e-cig. Has nicotine in it. No real cigarette any more    Family History  Problem Relation Age of Onset  . Hodgkin's lymphoma Father      Review of Systems:  See HPI for pertinent ROS. All other ROS negative.    Physical Exam: Blood pressure 130/70, pulse 80, temperature 97.9 F (36.6 C), temperature source Oral, resp. rate 20, weight 207 lb (93.895 kg)., Body mass index is 30.55 kg/(m^2). General: WNWD WM. Appears in no acute distress. Neck: Supple. No thyromegaly. No lymphadenopathy. Bilateral carotid bruits versus radiation of murmur to the neck. Lungs: Clear bilaterally to auscultation without wheezes, rales, or rhonchi. Breathing is unlabored. Heart: Regular rhythm. IV/ VI murmur. Abdomen: Soft, non-tender, non-distended with normoactive bowel sounds. No hepatomegaly. No rebound/guarding. No obvious abdominal masses. Musculoskeletal:  Strength and tone normal for age. Extremities/Skin: Warm and dry.  No edema. Neuro: Alert and oriented X 3. Moves all extremities spontaneously. Gait is normal. CNII-XII grossly in tact. Psych:  Responds to questions appropriately with a normal affect.     ASSESSMENT AND PLAN:  70 y.o. year old male with  1. Essential hypertension Blood Pressure controlled/ at goal. Check lab to monitor. - COMPLETE METABOLIC PANEL WITH GFR  2. BPH (benign prostatic hypertrophy) Stable/controlled.  - COMPLETE METABOLIC PANEL WITH GFR  3. Hyperglycemia Check lab to monitor. - COMPLETE METABOLIC PANEL WITH GFR  4. Hyperlipidemia - COMPLETE METABOLIC PANEL WITH GFR  5. Bilateral lower extremity edema (chronic) He Currently has no lower extremity edema on exam and this is controlled with current medications. - COMPLETE METABOLIC PANEL WITH GFR  6. Vitamin D deficiency   7. Alcohol abuse, daily use See HPI. He says that he doesn't plan to quit until he dies then says that he will  cut back to 3 per day. - COMPLETE METABOLIC PANEL WITH GFR  8. H/O Alcohol withdrawal seizure, with unspecified complication ([place]) This is been managed by neurology - COMPLETE METABOLIC PANEL WITH GFR  9. Hypertrophic obstructive cardiomyopathy (HOCM) ([place]) Managed by cardiology - COMPLETE METABOLIC PANEL WITH GFR  10. Complete heart block ([place]) Managed by cardiology. Has pacemaker implanted. - COMPLETE METABOLIC PANEL WITH GFR  11. Cardiac device in situ, other Managed by cardiology - COMPLETE METABOLIC PANEL WITH GFR  12. Celiac disease - COMPLETE METABOLIC PANEL WITH GFR  13. Gastrointestinal hemorrhage associated with duodenal ulcer He had anemia--secondary to  GI bleed in the past. Will check CBC to monitor her anemia. - COMPLETE METABOLIC PANEL WITH GFR - CBC with Differential/Platelet  14. Multiple duodenal ulcers He had anemia--secondary to  GI bleed in the past. Will check CBC to monitor her anemia.  - COMPLETE METABOLIC PANEL WITH GFR - CBC with Differential/Platelet  15. Alcoholism /alcohol abuse ([place]) See HPI - COMPLETE METABOLIC PANEL WITH GFR  16. Gout, unspecified cause, unspecified chronicity, unspecified site See HPI. Check uric acid level and if this is elevated will start either allopurinol or Uloric depending on his renal function. Alcohol abuse is contributing to this. HCTZ may be contributing as well but hesitant to stop this medicine given all of his other comorbidities. - Uric acid  Check labs and will follow-up with him when I get these results. Routine follow-up visit 6 months or sooner if needed.  Signed, [address] [place], [place], [facility] 12/13/2015 10:36 AM

## 2015-12-17 ENCOUNTER — Telehealth: Payer: Self-pay | Admitting: Family Medicine

## 2015-12-17 ENCOUNTER — Other Ambulatory Visit: Payer: Self-pay | Admitting: Family Medicine

## 2015-12-17 DIAGNOSIS — M109 Gout, unspecified: Secondary | ICD-10-CM

## 2015-12-17 DIAGNOSIS — R7989 Other specified abnormal findings of blood chemistry: Secondary | ICD-10-CM

## 2015-12-17 MED ORDER — COLCHICINE 0.6 MG PO TABS: 0.6000 mg | ORAL_TABLET | Freq: Two times a day (BID) | ORAL | Status: DC

## 2015-12-17 MED ORDER — ALLOPURINOL 300 MG PO TABS: 300.0000 mg | ORAL_TABLET | Freq: Every day | ORAL | Status: DC

## 2015-12-17 NOTE — Telephone Encounter (Signed)
-----   Message from Orlena Sheldon, PA-C sent at 12/14/2015 10:37 AM EST ----- Tell patient his sodium is dangerously low. He really needs to go to the hospital to have this corrected.  He is alcoholic and has recently had hospitalization with seizures and heart block.  At [place] Monday, he told me he had a lot of beer on Sunday.  If he refuses to go to hospital, tell him to restrict fluids--all fluids---(not just beer) to 1 liter per day.  Also, uric Acid is very high, c/w gout.  Add Gout to Problem List.  Tell him to take Allopurinol 380m po QD # 30 + 3 Explain to him that when he starts this medicine, it will cause the uric acid to move around in his body and he may have a gout flare--tell him to take Colcrys 0.681mBID for several weeks then he can try decreasing it to PRN.

## 2015-12-17 NOTE — Telephone Encounter (Signed)
Pt told about lab results.  Does not want to go to hosp at this time.  Told to restrict fluids to 1 liter a day (water, juice, tea  NOT alcohol or carbonated drinks).  Return Monday for repeat lab. (BMP ordered).  Also discussed gout.  Meds to pharmacy and pt instructed how to take.

## 2015-12-21 ENCOUNTER — Other Ambulatory Visit: Payer: Commercial Managed Care - HMO

## 2015-12-21 ENCOUNTER — Ambulatory Visit: Payer: Commercial Managed Care - HMO

## 2015-12-21 DIAGNOSIS — R799 Abnormal finding of blood chemistry, unspecified: Secondary | ICD-10-CM | POA: Diagnosis not present

## 2015-12-21 DIAGNOSIS — R7989 Other specified abnormal findings of blood chemistry: Secondary | ICD-10-CM

## 2015-12-21 LAB — BASIC METABOLIC PANEL WITH GFR
BUN: 15 mg/dL (ref 7–25)
CO2: 25 mmol/L (ref 20–31)
Calcium: 10 mg/dL (ref 8.6–10.3)
Chloride: 90 mmol/L — ABNORMAL LOW (ref 98–110)
Creat: 1.16 mg/dL (ref 0.70–1.18)
Glucose, Bld: 101 mg/dL — ABNORMAL HIGH (ref 70–99)
Potassium: 4.7 mmol/L (ref 3.5–5.3)
Sodium: 130 mmol/L — ABNORMAL LOW (ref 135–146)

## 2015-12-22 NOTE — Telephone Encounter (Signed)
-----   Message from Orlena Sheldon, PA-C sent at 12/22/2015  7:50 AM EST ----- Good !! Lab much better. Needs to keep alcohol intake as low as possible.

## 2015-12-22 NOTE — Telephone Encounter (Signed)
Pt aware of lab results and provider recommendations 

## 2016-01-09 DIAGNOSIS — F10120 Alcohol abuse with intoxication, uncomplicated: Secondary | ICD-10-CM | POA: Diagnosis not present

## 2016-01-17 ENCOUNTER — Telehealth: Payer: Self-pay | Admitting: *Deleted

## 2016-01-17 NOTE — Telephone Encounter (Signed)
Submitted humana referral thru acuity connect for authorization on 01/13/16 to Dr. Hebert Soho with authorization [id number]  Requesting provider: Flonnie Hailstone  Treating provider: Will Camnitz,MD  Number of visits:6  Start Date:01/19/16  End Date:07/20/16  Dx:I44-atrioventricular block,complete      Z95.0-Pressure of cardiac pacemaker

## 2016-01-19 ENCOUNTER — Ambulatory Visit (INDEPENDENT_AMBULATORY_CARE_PROVIDER_SITE_OTHER): Payer: Commercial Managed Care - HMO | Admitting: *Deleted

## 2016-01-19 ENCOUNTER — Telehealth: Payer: Self-pay | Admitting: Cardiology

## 2016-01-19 DIAGNOSIS — I442 Atrioventricular block, complete: Secondary | ICD-10-CM

## 2016-01-19 NOTE — Progress Notes (Signed)
Remote pacemaker transmission.   

## 2016-01-19 NOTE — Telephone Encounter (Signed)
Spoke with pt and reminded pt of remote transmission that is due today. Pt verbalized understanding.   

## 2016-01-31 ENCOUNTER — Encounter: Payer: Self-pay | Admitting: Cardiology

## 2016-01-31 LAB — CUP PACEART REMOTE DEVICE CHECK
Battery Impedance: 100 Ohm
Battery Remaining Longevity: 153 mo
Battery Voltage: 2.79 V
Brady Statistic AP VP Percent: 5 %
Brady Statistic AP VS Percent: 0 %
Brady Statistic AS VP Percent: 95 %
Brady Statistic AS VS Percent: 0 %
Date Time Interrogation Session: 20170315163636
Implantable Lead Implant Date: 20160902
Implantable Lead Implant Date: 20160902
Implantable Lead Location: 753859
Implantable Lead Location: 753860
Implantable Lead Model: 5076
Implantable Lead Model: 5076
Lead Channel Impedance Value: 540 Ohm
Lead Channel Impedance Value: 572 Ohm
Lead Channel Pacing Threshold Amplitude: 0.75 V
Lead Channel Pacing Threshold Amplitude: 0.875 V
Lead Channel Pacing Threshold Pulse Width: 0.4 ms
Lead Channel Pacing Threshold Pulse Width: 0.4 ms
Lead Channel Sensing Intrinsic Amplitude: 2.8 mV
Lead Channel Setting Pacing Amplitude: 1.5 V
Lead Channel Setting Pacing Amplitude: 2 V
Lead Channel Setting Pacing Pulse Width: 0.4 ms
Lead Channel Setting Sensing Sensitivity: 4 mV

## 2016-03-02 ENCOUNTER — Other Ambulatory Visit: Payer: Self-pay | Admitting: Physician Assistant

## 2016-03-02 NOTE — Telephone Encounter (Signed)
Medication refilled per protocol. 

## 2016-04-06 ENCOUNTER — Encounter: Payer: Self-pay | Admitting: Family Medicine

## 2016-04-09 ENCOUNTER — Other Ambulatory Visit: Payer: Self-pay | Admitting: Physician Assistant

## 2016-04-18 ENCOUNTER — Other Ambulatory Visit: Payer: Self-pay | Admitting: Physician Assistant

## 2016-04-18 NOTE — Telephone Encounter (Signed)
Medication refilled per protocol. 

## 2016-04-19 ENCOUNTER — Ambulatory Visit (INDEPENDENT_AMBULATORY_CARE_PROVIDER_SITE_OTHER): Payer: Commercial Managed Care - HMO | Admitting: *Deleted

## 2016-04-19 DIAGNOSIS — I442 Atrioventricular block, complete: Secondary | ICD-10-CM | POA: Diagnosis not present

## 2016-04-19 NOTE — Progress Notes (Signed)
Remote pacemaker transmission.   

## 2016-04-21 LAB — CUP PACEART REMOTE DEVICE CHECK
Battery Impedance: 110 Ohm
Battery Remaining Longevity: 148 mo
Battery Voltage: 2.79 V
Brady Statistic AP VP Percent: 5 %
Brady Statistic AP VS Percent: 0 %
Brady Statistic AS VP Percent: 95 %
Brady Statistic AS VS Percent: 0 %
Date Time Interrogation Session: 20170614134737
Implantable Lead Implant Date: 20160902
Implantable Lead Implant Date: 20160902
Implantable Lead Location: 753859
Implantable Lead Location: 753860
Implantable Lead Model: 5076
Implantable Lead Model: 5076
Lead Channel Impedance Value: 517 Ohm
Lead Channel Impedance Value: 572 Ohm
Lead Channel Setting Pacing Amplitude: 2 V
Lead Channel Setting Pacing Amplitude: 2 V
Lead Channel Setting Pacing Pulse Width: 0.4 ms
Lead Channel Setting Sensing Sensitivity: 4 mV

## 2016-04-26 ENCOUNTER — Encounter: Payer: Self-pay | Admitting: Cardiology

## 2016-05-09 ENCOUNTER — Other Ambulatory Visit: Payer: Self-pay | Admitting: Physician Assistant

## 2016-05-10 MED ORDER — AMLODIPINE BESYLATE 5 MG PO TABS: ORAL_TABLET | ORAL | Status: DC

## 2016-05-10 NOTE — Telephone Encounter (Signed)
5 mg Amlodipine refilled

## 2016-07-11 ENCOUNTER — Telehealth: Payer: Self-pay | Admitting: Family Medicine

## 2016-07-11 NOTE — Telephone Encounter (Signed)
Continuing visits with Neurologist Dr Delice Lesch for R56.9 (convulsions) and R26.89 (abn gait) for 6 visits from 07/17/16 - 01/13/17.  Auth # [id number]

## 2016-07-12 ENCOUNTER — Other Ambulatory Visit: Payer: Self-pay | Admitting: Physician Assistant

## 2016-07-17 ENCOUNTER — Ambulatory Visit (INDEPENDENT_AMBULATORY_CARE_PROVIDER_SITE_OTHER): Payer: Commercial Managed Care - HMO | Admitting: Neurology

## 2016-07-17 DIAGNOSIS — R569 Unspecified convulsions: Secondary | ICD-10-CM | POA: Diagnosis not present

## 2016-07-18 NOTE — Procedures (Signed)
ELECTROENCEPHALOGRAM REPORT  Date of Study: 07/17/2016  Patient's Name: Francisco [name] MRN: [id number] Date of Birth: October 06, 1946  Referring Provider: Dr. Ellouise Newer  Clinical History: This is a 70 year old man with seizures. In 2016, 2 convulsions were associated with cardiac arrest, s/p pacemaker placement. He is interested in tapering off medication.  Medications: Keppra, Norvasc, Lasix, Prinzide, Zestoretic  Technical Summary: A multichannel digital EEG recording measured by the international 10-20 system with electrodes applied with paste and impedances below 5000 ohms performed in our laboratory with EKG monitoring in an awake and drowsy patient.  Hyperventilation was not performed. Photic stimulation was performed.  The digital EEG was referentially recorded, reformatted, and digitally filtered in a variety of bipolar and referential montages for optimal display.    Description: The patient is awake and drowsy during the recording.  During maximal wakefulness, there is a symmetric, medium voltage 9 Hz posterior dominant rhythm that attenuates with eye opening.  The record is symmetric.  During drowsiness, there is an increase in theta slowing of the background.  Deeper stages of sleep were not seen.  Photic stimulation did not elicit any abnormalities.  There were no epileptiform discharges or electrographic seizures seen.    EKG lead showed rare extrasystolic beats.  Impression: This awake and drowsy EEG is normal.    Clinical Correlation: A normal EEG does not exclude a clinical diagnosis of epilepsy.  If further clinical questions remain, prolonged EEG may be helpful.  Clinical correlation is advised.   Ellouise Newer, M.D.

## 2016-08-07 ENCOUNTER — Ambulatory Visit: Payer: Commercial Managed Care - HMO | Admitting: Neurology

## 2016-08-09 ENCOUNTER — Ambulatory Visit (INDEPENDENT_AMBULATORY_CARE_PROVIDER_SITE_OTHER): Payer: Commercial Managed Care - HMO | Admitting: Neurology

## 2016-08-09 ENCOUNTER — Encounter: Payer: Self-pay | Admitting: Neurology

## 2016-08-09 DIAGNOSIS — R569 Unspecified convulsions: Secondary | ICD-10-CM | POA: Diagnosis not present

## 2016-08-09 MED ORDER — LEVETIRACETAM ER 500 MG PO TB24: ORAL_TABLET | ORAL | 3 refills | Status: DC

## 2016-08-09 NOTE — Patient Instructions (Signed)
1. Continue all your medications 2. Follow-up in 1 year, call for any changes  Seizure Precautions: 1. If medication has been prescribed for you to prevent seizures, take it exactly as directed.  Do not stop taking the medicine without talking to your doctor first, even if you have not had a seizure in a long time.   2. Avoid activities in which a seizure would cause danger to yourself or to others.  Don't operate dangerous machinery, swim alone, or climb in high or dangerous places, such as on ladders, roofs, or girders.  Do not drive unless your doctor says you may.  3. If you have any warning that you may have a seizure, lay down in a safe place where you can't hurt yourself.    4.  No driving for 6 months from last seizure, as per [facility].   Please refer to the following link on the [facility] website for more information: http://www.epilepsyfoundation.org/answerplace/Social/driving/drivingu.cfm   5.  Maintain good sleep hygiene. Minimize alcohol use  6.  Contact your doctor if you have any problems that may be related to the medicine you are taking.  7.  Call 911 and bring the patient back to the ED if:        A.  The seizure lasts longer than 5 minutes.       B.  The patient doesn't awaken shortly after the seizure  C.  The patient has new problems such as difficulty seeing, speaking or moving  D.  The patient was injured during the seizure  E.  The patient has a temperature over 102 F (39C)  F.  The patient vomited and now is having trouble breathing
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
## 2016-08-14 ENCOUNTER — Emergency Department (HOSPITAL_COMMUNITY): Payer: Commercial Managed Care - HMO

## 2016-08-14 ENCOUNTER — Observation Stay (HOSPITAL_COMMUNITY)
Admission: EM | Admit: 2016-08-14 | Discharge: 2016-08-15 | Discharge status: Against Medical Advice | Payer: Commercial Managed Care - HMO | Attending: Internal Medicine | Admitting: Internal Medicine

## 2016-08-14 ENCOUNTER — Encounter (HOSPITAL_COMMUNITY): Payer: Self-pay

## 2016-08-14 DIAGNOSIS — K219 Gastro-esophageal reflux disease without esophagitis: Secondary | ICD-10-CM | POA: Diagnosis not present

## 2016-08-14 DIAGNOSIS — I5033 Acute on chronic diastolic (congestive) heart failure: Secondary | ICD-10-CM

## 2016-08-14 DIAGNOSIS — R748 Abnormal levels of other serum enzymes: Secondary | ICD-10-CM | POA: Diagnosis not present

## 2016-08-14 DIAGNOSIS — Z87891 Personal history of nicotine dependence: Secondary | ICD-10-CM | POA: Diagnosis not present

## 2016-08-14 DIAGNOSIS — D649 Anemia, unspecified: Secondary | ICD-10-CM | POA: Insufficient documentation

## 2016-08-14 DIAGNOSIS — I422 Other hypertrophic cardiomyopathy: Secondary | ICD-10-CM

## 2016-08-14 DIAGNOSIS — R079 Chest pain, unspecified: Secondary | ICD-10-CM | POA: Insufficient documentation

## 2016-08-14 DIAGNOSIS — G40909 Epilepsy, unspecified, not intractable, without status epilepticus: Secondary | ICD-10-CM | POA: Diagnosis not present

## 2016-08-14 DIAGNOSIS — Z950 Presence of cardiac pacemaker: Secondary | ICD-10-CM | POA: Insufficient documentation

## 2016-08-14 DIAGNOSIS — F1020 Alcohol dependence, uncomplicated: Secondary | ICD-10-CM | POA: Insufficient documentation

## 2016-08-14 DIAGNOSIS — I421 Obstructive hypertrophic cardiomyopathy: Secondary | ICD-10-CM | POA: Insufficient documentation

## 2016-08-14 DIAGNOSIS — N400 Enlarged prostate without lower urinary tract symptoms: Secondary | ICD-10-CM | POA: Diagnosis not present

## 2016-08-14 DIAGNOSIS — E785 Hyperlipidemia, unspecified: Secondary | ICD-10-CM | POA: Diagnosis present

## 2016-08-14 DIAGNOSIS — E871 Hypo-osmolality and hyponatremia: Secondary | ICD-10-CM | POA: Diagnosis not present

## 2016-08-14 DIAGNOSIS — I10 Essential (primary) hypertension: Secondary | ICD-10-CM | POA: Diagnosis present

## 2016-08-14 DIAGNOSIS — R739 Hyperglycemia, unspecified: Secondary | ICD-10-CM | POA: Diagnosis not present

## 2016-08-14 DIAGNOSIS — E876 Hypokalemia: Secondary | ICD-10-CM | POA: Diagnosis not present

## 2016-08-14 DIAGNOSIS — Z79899 Other long term (current) drug therapy: Secondary | ICD-10-CM | POA: Diagnosis not present

## 2016-08-14 DIAGNOSIS — E559 Vitamin D deficiency, unspecified: Secondary | ICD-10-CM | POA: Diagnosis not present

## 2016-08-14 DIAGNOSIS — I110 Hypertensive heart disease with heart failure: Secondary | ICD-10-CM | POA: Insufficient documentation

## 2016-08-14 DIAGNOSIS — M109 Gout, unspecified: Secondary | ICD-10-CM | POA: Insufficient documentation

## 2016-08-14 DIAGNOSIS — IMO0001 Reserved for inherently not codable concepts without codable children: Secondary | ICD-10-CM | POA: Diagnosis present

## 2016-08-14 DIAGNOSIS — R0789 Other chest pain: Secondary | ICD-10-CM | POA: Diagnosis not present

## 2016-08-14 DIAGNOSIS — I2510 Atherosclerotic heart disease of native coronary artery without angina pectoris: Secondary | ICD-10-CM | POA: Insufficient documentation

## 2016-08-14 DIAGNOSIS — R569 Unspecified convulsions: Secondary | ICD-10-CM

## 2016-08-14 DIAGNOSIS — I442 Atrioventricular block, complete: Secondary | ICD-10-CM | POA: Diagnosis not present

## 2016-08-14 DIAGNOSIS — Z8674 Personal history of sudden cardiac arrest: Secondary | ICD-10-CM | POA: Insufficient documentation

## 2016-08-14 DIAGNOSIS — K210 Gastro-esophageal reflux disease with esophagitis: Secondary | ICD-10-CM

## 2016-08-14 LAB — CBC
HCT: 44.1 % (ref 39.0–52.0)
Hemoglobin: 15.9 g/dL (ref 13.0–17.0)
MCH: 32.9 pg (ref 26.0–34.0)
MCHC: 36.1 g/dL — ABNORMAL HIGH (ref 30.0–36.0)
MCV: 91.3 fL (ref 78.0–100.0)
Platelets: 222 K/uL (ref 150–400)
RBC: 4.83 MIL/uL (ref 4.22–5.81)
RDW: 14.7 % (ref 11.5–15.5)
WBC: 9.2 K/uL (ref 4.0–10.5)

## 2016-08-14 LAB — MAGNESIUM: Magnesium: 1.4 mg/dL — ABNORMAL LOW (ref 1.7–2.4)

## 2016-08-14 LAB — BASIC METABOLIC PANEL
Anion gap: 20 — ABNORMAL HIGH (ref 5–15)
BUN: 6 mg/dL (ref 6–20)
CO2: 20 mmol/L — ABNORMAL LOW (ref 22–32)
Calcium: 8.9 mg/dL (ref 8.9–10.3)
Chloride: 89 mmol/L — ABNORMAL LOW (ref 101–111)
Creatinine, Ser: 1.24 mg/dL (ref 0.61–1.24)
GFR calc Af Amer: >60 mL/min (ref >60)
GFR calc non Af Amer: 57 mL/min — ABNORMAL LOW (ref >60)
Glucose, Bld: 187 mg/dL — ABNORMAL HIGH (ref 65–99)
Potassium: 3.2 mmol/L — ABNORMAL LOW (ref 3.5–5.1)
Sodium: 129 mmol/L — ABNORMAL LOW (ref 135–145)

## 2016-08-14 LAB — TROPONIN I
Troponin I: 0.07 ng/mL (ref <0.03)
Troponin I: 0.1 ng/mL (ref <0.03)
Troponin I: 0.09 ng/mL (ref <0.03)

## 2016-08-14 LAB — ETHANOL: Alcohol, Ethyl (B): <5 mg/dL (ref <5)

## 2016-08-14 LAB — I-STAT TROPONIN, ED: Troponin i, poc: 0.02 ng/mL (ref 0.00–0.08)

## 2016-08-14 LAB — CBG MONITORING, ED: Glucose-Capillary: 187 mg/dL — ABNORMAL HIGH (ref 65–99)

## 2016-08-14 MED ORDER — LEVETIRACETAM ER 500 MG PO TB24
500.0000 mg | ORAL_TABLET | Freq: Every day | ORAL | Status: DC
  Administered 2016-08-14: 500 mg via ORAL
  Filled 2016-08-14: qty 1

## 2016-08-14 MED ORDER — GI COCKTAIL ~~LOC~~: 30.0000 mL | Freq: Four times a day (QID) | ORAL | Status: DC | PRN

## 2016-08-14 MED ORDER — FUROSEMIDE 20 MG PO TABS
20.0000 mg | ORAL_TABLET | Freq: Every day | ORAL | Status: DC
  Administered 2016-08-15: 20 mg via ORAL
  Filled 2016-08-14: qty 1

## 2016-08-14 MED ORDER — SODIUM CHLORIDE 0.9 % IV SOLN
INTRAVENOUS | Status: DC
  Administered 2016-08-14: 18:00:00 via INTRAVENOUS

## 2016-08-14 MED ORDER — LORAZEPAM 1 MG PO TABS
1.0000 mg | ORAL_TABLET | Freq: Four times a day (QID) | ORAL | Status: DC | PRN
  Administered 2016-08-15: 1 mg via ORAL
  Filled 2016-08-14: qty 1

## 2016-08-14 MED ORDER — FOLIC ACID 1 MG PO TABS
1.0000 mg | ORAL_TABLET | Freq: Every day | ORAL | Status: DC
  Administered 2016-08-15: 1 mg via ORAL
  Filled 2016-08-14: qty 1

## 2016-08-14 MED ORDER — ENOXAPARIN SODIUM 40 MG/0.4ML ~~LOC~~ SOLN
40.0000 mg | SUBCUTANEOUS | Status: DC
  Filled 2016-08-14: qty 0.4

## 2016-08-14 MED ORDER — ALLOPURINOL 300 MG PO TABS
300.0000 mg | ORAL_TABLET | Freq: Every day | ORAL | Status: DC
  Administered 2016-08-15: 300 mg via ORAL
  Filled 2016-08-14 (×2): qty 1

## 2016-08-14 MED ORDER — ASPIRIN EC 325 MG PO TBEC
325.0000 mg | DELAYED_RELEASE_TABLET | Freq: Every day | ORAL | Status: DC
  Administered 2016-08-15: 325 mg via ORAL
  Filled 2016-08-14 (×2): qty 1

## 2016-08-14 MED ORDER — INFLUENZA VAC SPLIT QUAD 0.5 ML IM SUSY: 0.5000 mL | PREFILLED_SYRINGE | INTRAMUSCULAR | Status: DC

## 2016-08-14 MED ORDER — AMLODIPINE BESYLATE 5 MG PO TABS
5.0000 mg | ORAL_TABLET | Freq: Every day | ORAL | Status: DC
  Administered 2016-08-15: 5 mg via ORAL
  Filled 2016-08-14 (×2): qty 1

## 2016-08-14 MED ORDER — ONDANSETRON HCL 4 MG/2ML IJ SOLN: 4.0000 mg | Freq: Four times a day (QID) | INTRAMUSCULAR | Status: DC | PRN

## 2016-08-14 MED ORDER — PANTOPRAZOLE SODIUM 40 MG PO TBEC
40.0000 mg | DELAYED_RELEASE_TABLET | Freq: Two times a day (BID) | ORAL | Status: DC
  Administered 2016-08-14 – 2016-08-15 (×2): 40 mg via ORAL
  Filled 2016-08-14 (×2): qty 1

## 2016-08-14 MED ORDER — LISINOPRIL-HYDROCHLOROTHIAZIDE 20-25 MG PO TABS: 1.0000 | ORAL_TABLET | Freq: Every day | ORAL | Status: DC

## 2016-08-14 MED ORDER — LEVETIRACETAM ER 500 MG PO TB24
1500.0000 mg | ORAL_TABLET | Freq: Every day | ORAL | Status: DC
  Administered 2016-08-15: 1500 mg via ORAL
  Filled 2016-08-14: qty 3

## 2016-08-14 MED ORDER — LEVETIRACETAM ER 500 MG PO TB24
1500.0000 mg | ORAL_TABLET | Freq: Every day | ORAL | Status: DC
Start: 2016-08-14 — End: 2016-08-14

## 2016-08-14 MED ORDER — POTASSIUM CHLORIDE ER 10 MEQ PO TBCR
10.0000 meq | EXTENDED_RELEASE_TABLET | Freq: Every day | ORAL | Status: DC
  Administered 2016-08-15: 10 meq via ORAL
  Filled 2016-08-14 (×2): qty 1

## 2016-08-14 MED ORDER — DEXTROSE 50 % IV SOLN: 1.0000 | Freq: Once | INTRAVENOUS | Status: DC

## 2016-08-14 MED ORDER — LORAZEPAM 2 MG/ML IJ SOLN: 1.0000 mg | Freq: Four times a day (QID) | INTRAMUSCULAR | Status: DC | PRN

## 2016-08-14 MED ORDER — COLCHICINE 0.6 MG PO TABS
0.6000 mg | ORAL_TABLET | Freq: Two times a day (BID) | ORAL | Status: DC
  Filled 2016-08-14 (×2): qty 1

## 2016-08-14 MED ORDER — THIAMINE HCL 100 MG/ML IJ SOLN: 100.0000 mg | Freq: Every day | INTRAMUSCULAR | Status: DC

## 2016-08-14 MED ORDER — ACETAMINOPHEN 325 MG PO TABS: 650.0000 mg | ORAL_TABLET | ORAL | Status: DC | PRN

## 2016-08-14 MED ORDER — ADULT MULTIVITAMIN W/MINERALS CH
1.0000 | ORAL_TABLET | Freq: Every day | ORAL | Status: DC
  Administered 2016-08-15: 1 via ORAL
  Filled 2016-08-14: qty 1

## 2016-08-14 MED ORDER — SODIUM CHLORIDE 0.9 % IV BOLUS (SEPSIS)
500.0000 mL | Freq: Once | INTRAVENOUS | Status: AC
  Administered 2016-08-14: 500 mL via INTRAVENOUS

## 2016-08-14 MED ORDER — VITAMIN B-1 100 MG PO TABS
100.0000 mg | ORAL_TABLET | Freq: Every day | ORAL | Status: DC
  Administered 2016-08-15: 100 mg via ORAL
  Filled 2016-08-14: qty 1

## 2016-08-14 MED ORDER — TAMSULOSIN HCL 0.4 MG PO CAPS
0.4000 mg | ORAL_CAPSULE | Freq: Every day | ORAL | Status: DC
  Administered 2016-08-15: 0.4 mg via ORAL
  Filled 2016-08-14 (×2): qty 1

## 2016-08-14 MED ORDER — HYDROCHLOROTHIAZIDE 25 MG PO TABS
25.0000 mg | ORAL_TABLET | Freq: Every day | ORAL | Status: DC
  Administered 2016-08-15: 25 mg via ORAL
  Filled 2016-08-14: qty 1

## 2016-08-14 MED ORDER — LISINOPRIL 10 MG PO TABS
20.0000 mg | ORAL_TABLET | Freq: Every day | ORAL | Status: DC
  Administered 2016-08-15: 20 mg via ORAL
  Filled 2016-08-14: qty 2

## 2016-08-14 NOTE — ED Notes (Signed)
[employer] contacted RN to inform device functioning as programmed. Several PACs noted but no true atrial arrhythmias noted.

## 2016-08-14 NOTE — ED Notes (Signed)
Attempted report x 2 

## 2016-08-14 NOTE — H&P (Signed)
History and Physical    MARCELLINO FIDALGO UGQ:[id number] DOB: 10-02-1946 DOA: 08/14/2016   PCP: Karis Juba, PA-C   Patient coming from:  Home    Chief Complaint: Chest pain   HPI: Francisco [name] is a 70 y.o. 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 He did not fall.. Activity lasted about 2 mins, followed by chest pain after seizure activity. He denies any jawpain or left arm pain or tingling. He denies any shortness of breath. No fever chills or night sweats. No recent infection. He denies any headaches or vision changes. PAtient has not been seen by cardiologist in over 7 months. He smokes. His last drink was yesterday, 4 beers abd 4 oz liquor. He denies any withdrawal symptoms at this time. He denies any recreational drug use. No recent trips or infections. HE is compliant with meds Last hospitalization 1 year ago for seizures with convulsion. At the time he had cardiac arrest requiring CPR  And PMP as mentioned earlier.     ED Course:  BP 129/75   Pulse 92   Temp 98.4 F (36.9 C) (Oral)   Resp 18   SpO2 96%    sodium 129 potassium 3.2 bicarb 20   Review of Systems: As per HPI otherwise 10 point review of systems negative.   Past Medical History:  Diagnosis Date  . Anemia   . Celiac disease   . GERD (gastroesophageal reflux disease)   . Heart murmur   . Hypertension   . Multiple duodenal ulcers   . Shortness of breath   . Symptomatic Bradycardia    a. 07/2015 s/p MDT Advisa L DC PPM (Ser #: [id number]).  . 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 CARDIAC CATHETERIZATION N/A 07/08/2015   Procedure: Left Heart Cath and Coronary Angiography;   Surgeon: Troy Sine, MD;  Location: [place] [facility];  Service: Cardiovascular;  Laterality: N/A;  . CARDIAC CATHETERIZATION N/A 07/08/2015   Procedure: Temporary Pacemaker;  Surgeon: Troy Sine, MD;  Location: [place] [facility];  Service: Cardiovascular;  Laterality: N/A;  . CARDIAC CATHETERIZATION N/A 07/09/2015   Procedure: Temporary Pacemaker;  Surgeon: Troy Sine, MD;  Location: [place] [facility];  Service: Cardiovascular;  Laterality: N/A;  . EP IMPLANTABLE DEVICE N/A 07/09/2015   Procedure: Pacemaker Implant;  Surgeon: Will Meredith Leeds, MD;  Location: [place] [facility];  Service: Cardiovascular;  Laterality: N/A;  . ESOPHAGOGASTRODUODENOSCOPY  07/03/2012   Procedure: ESOPHAGOGASTRODUODENOSCOPY (EGD);  Surgeon: Winfield Cunas., MD;  Location: [facility];  Service: Endoscopy;  Laterality: N/A;  . HEMORRHOID SURGERY    . HERNIA REPAIR    . MOLE REMOVAL      Social History Social History   Social History  . Marital status: Married    Spouse name: N/A  . Number of children: N/A  . Years of education: N/A   Occupational History  . Not on file.   Social History Main Topics  . Smoking status: Former Smoker    Packs/day: 0.50    Years: 50.00    Types: Cigarettes  . Smokeless tobacco: Former Systems developer  . Alcohol use 0.0 oz/week  1 - 2 Glasses of wine per week     Comment: ocassional  . Drug use: No  . Sexual activity: No   Other Topics Concern  . Not on file   Social History Narrative   Lives alone. --as of 11/2013   Smokes e-cig. Has nicotine in it. No real cigarette any more     Allergies  Allergen Reactions  . Gluten Meal Other (See Comments)    Celiac Disease    Family History  Problem Relation Age of Onset  . Hodgkin's lymphoma Father       Prior to Admission medications   Medication Sig Start Date End Date Taking? Authorizing Provider  allopurinol (ZYLOPRIM) 300 MG tablet TAKE 1 TABLET(300 MG) BY MOUTH DAILY 04/18/16   Orlena Sheldon, PA-C    amLODipine (NORVASC) 5 MG tablet TAKE 1 TABLET(5 MG) BY MOUTH DAILY 05/10/16   Orlena Sheldon, PA-C  Cholecalciferol (VITAMIN D3) 1000 UNITS CAPS Take 1 capsule (1,000 Units total) by mouth daily. 11/18/13   Lonie Peak Dixon, PA-C  clotrimazole-betamethasone (LOTRISONE) cream APPLY EXTERNALLY TO THE AFFECTED AREA TWICE DAILY 12/02/15   Orlena Sheldon, PA-C  colchicine 0.6 MG tablet Take 1 tablet (0.6 mg total) by mouth 2 (two) times daily. Take twice daily for 2 weeks, then use as needed for gout flare up 12/17/15   Orlena Sheldon, PA-C  ferrous sulfate 325 (65 FE) MG tablet Take 1 tablet (325 mg total) by mouth 2 (two) times daily with a meal. 07/12/15   Velvet Bathe, MD  furosemide (LASIX) 20 MG tablet TAKE 1 TABLET(20 MG) BY MOUTH DAILY 03/02/16   Orlena Sheldon, PA-C  levETIRAcetam (KEPPRA XR) 500 MG 24 hr tablet Take 2 tablets every morning 08/09/16   Cameron Sprang, MD  lisinopril-hydrochlorothiazide (PRINZIDE,ZESTORETIC) 20-25 MG tablet TAKE 1 TABLET BY MOUTH DAILY 03/02/16   Orlena Sheldon, PA-C  pantoprazole (PROTONIX) 40 MG tablet TAKE 1 TABLET BY MOUTH TWICE DAILY BEFORE A MEAL 07/13/16   Orlena Sheldon, PA-C  potassium chloride (K-DUR) 10 MEQ tablet TAKE 1 TABLET(10 MEQ) BY MOUTH DAILY 04/10/16   Orlena Sheldon, PA-C  tamsulosin (FLOMAX) 0.4 MG CAPS capsule TAKE ONE CAPSULE BY MOUTH DAILY 03/02/16   Orlena Sheldon, PA-C    Physical Exam:    Vitals:   08/14/16 1200 08/14/16 1230 08/14/16 1300 08/14/16 1331  BP: 134/64 119/57 123/68 129/75  Pulse: 89 96 90 92  Resp: 13 18 14 18   Temp:      TempSrc:      SpO2: 95% 90% 92% 96%       Constitutional: NAD, calm, comfortable  Vitals:   08/14/16 1200 08/14/16 1230 08/14/16 1300 08/14/16 1331  BP: 134/64 119/57 123/68 129/75  Pulse: 89 96 90 92  Resp: 13 18 14 18   Temp:      TempSrc:      SpO2: 95% 90% 92% 96%   Eyes: PERRL, lids and conjunctivae normal ENMT: Mucous membranes are moist. Posterior pharynx clear of any exudate or lesions.Normal dentition.   Neck: normal, supple, no masses, no thyromegaly Respiratory: clear to auscultation bilaterally, no wheezing, no crackles. Normal respiratory effort. No accessory muscle use.  Cardiovascular: Regular rate and rhythm,2/6 murmurs / rubs / gallops. No extremity edema. 2+ pedal pulses. No carotid bruits.  Abdomen: no tenderness, no masses palpated. No hepatosplenomegaly. Bowel sounds positive.  Musculoskeletal: no clubbing / cyanosis. No joint deformity upper and lower extremities. Good ROM, no contractures.  Normal muscle tone.  Skin: no rashes, lesions, ulcers.  Neurologic: CN 2-12 grossly intact. Sensation intact, DTR normal. Strength 5/5 in all 4.  Psychiatric: Normal judgment and insight. Alert and oriented x 3. Normal mood.     Labs on Admission: I have personally reviewed following labs and imaging studies  CBC:  Recent Labs Lab 08/14/16 1120  WBC 9.2  HGB 15.9  HCT 44.1  MCV 91.3  PLT 161    Basic Metabolic Panel:  Recent Labs Lab 08/14/16 1120  NA 129*  K 3.2*  CL 89*  CO2 20*  GLUCOSE 187*  BUN 6  CREATININE 1.24  CALCIUM 8.9    GFR: Estimated Creatinine Clearance: 63.9 mL/min (by C-G formula based on SCr of 1.24 mg/dL).  Liver Function Tests: No results for input(s): AST, ALT, ALKPHOS, BILITOT, PROT, ALBUMIN in the last 168 hours. No results for input(s): LIPASE, AMYLASE in the last 168 hours. No results for input(s): AMMONIA in the last 168 hours.  Coagulation Profile: No results for input(s): INR, PROTIME in the last 168 hours.  Cardiac Enzymes: No results for input(s): CKTOTAL, CKMB, CKMBINDEX, TROPONINI in the last 168 hours.  BNP (last 3 results) No results for input(s): PROBNP in the last 8760 hours.  HbA1C: No results for input(s): HGBA1C in the last 72 hours.  CBG:  Recent Labs Lab 08/14/16 1125  GLUCAP 187*    Lipid Profile: No results for input(s): CHOL, HDL, LDLCALC, TRIG, CHOLHDL, LDLDIRECT in the last 72 hours.  Thyroid  Function Tests: No results for input(s): TSH, T4TOTAL, FREET4, T3FREE, THYROIDAB in the last 72 hours.  Anemia Panel: No results for input(s): VITAMINB12, FOLATE, FERRITIN, TIBC, IRON, RETICCTPCT in the last 72 hours.  Urine analysis:    Component Value Date/Time   COLORURINE YELLOW 08/13/2014 1010   APPEARANCEUR CLEAR 08/13/2014 1010   LABSPEC 1.017 08/13/2014 1010   PHURINE 6.0 08/13/2014 1010   GLUCOSEU NEG 08/13/2014 1010   HGBUR NEG 08/13/2014 1010   BILIRUBINUR SMALL (A) 08/13/2014 1010   KETONESUR 15 (A) 08/13/2014 1010   PROTEINUR NEG 08/13/2014 1010   UROBILINOGEN 0.2 08/13/2014 1010   NITRITE NEG 08/13/2014 1010   LEUKOCYTESUR NEG 08/13/2014 1010    Sepsis Labs: @LABRCNTIP (procalcitonin:4,lacticidven:4) )No results found for this or any previous visit (from the past 240 hour(s)).   Radiological Exams on Admission: Dg Chest Portable 1 View  Result Date: 08/14/2016 CLINICAL DATA:  Chest pain for 2 hours. EXAM: PORTABLE CHEST 1 VIEW COMPARISON:  07/10/2015 FINDINGS: 1139 hours. Low volume film with stable asymmetric elevation right hemidiaphragm. No evidence for focal airspace consolidation or airspace pulmonary edema. No substantial pleural effusion. Cardiopericardial silhouette is at upper limits of normal for size. Left delete permanent pacemaker again noted. Telemetry leads overlie the chest. IMPRESSION: Stable.  No acute cardiopulmonary findings. Electronically Signed   By: Misty Stanley M.D.   On: 08/14/2016 11:48    EKG: Independently reviewed.  Assessment/Plan Active Problems:   HTN (hypertension)   Hyponatremia   GERD (gastroesophageal reflux disease)   Hyperglycemia   Hyperlipidemia   Hypertrophic cardiomyopathy (HCC)   Diastolic CHF, acute on chronic (HCC)   Alcoholism /alcohol abuse ([place])   Gout   Chest pain   Chest pain syndrome/known CAD,s/p Pacepmaker placement 2016 . Heart score 4 Troponin neg , EKG without evidence of ACS. Did not receive nitro.  He is chest pain free at this time. Last cath 9/1 normal coronaries, EF 65  CXR unrevealing. 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   No evidence of infectious  process causing seizures.  No headache or other complaints to suggest intracranial process. Neuro evaluated patient, suggesting this event was due to undertherapeutic Keppra dose, increasing it to 1500 a day. Last admission for seizures was in 06/2015 due to ETOH withdrawal. EEG 10/2 was normal . ETOH neg less than 5 -  Seizure order set   - CIWA protocol  - CK, lactic acid     UDS  - f/u  Urine culture   Hyperlipidemia Continue home statins   Alcohol abuse and dependence and at risk for withdrawal, last dring yesterday. ETOH less than 5 as above . DGlu 185  -  CIWA with Ativan per protocol -  Thiamine, folate, and MVI D50 1 amp   Hyponatremia  In the setting of ETOH currently at 129 but at baseline when compared to prior labs bet 125-130   - IV Normal saline - follow BMP closely   Hypokalemia, may be due to diuretics. EKG neg. Current K  3.2 Oral replenishment Repeat CMET in am   Hypertension BP 129/75   Pulse 92   Temp 98.4 F (36.9 C) (Oral)   Resp 18   SpO2 96%  Controlled Continue home anti-hypertensive medications   GERD, no acute symptoms: Continue PPI    DVT prophylaxis: Lovenox   Code Status:   Full     Family Communication:  Discussed with patient  Disposition Plan: Expect patient to be discharged to home after condition improves Consults called:   Neuro Admission status:Tele  Obs   Sharene Butters E, PA-C [facility]    [email or web address] Password [email or web address]  08/14/2016, 2:35 PM

## 2016-08-14 NOTE — ED Notes (Signed)
Placed patient into a gown and on the monitor did ekg shown to Dr Lita Mains

## 2016-08-14 NOTE — ED Notes (Signed)
Attempted report x1. 

## 2016-08-14 NOTE — ED Notes (Signed)
Gave pt [place] sandwich, applesauce and apple juice, per Dorothea Ogle - PA.

## 2016-08-14 NOTE — ED Provider Notes (Signed)
[place] DEPT Provider Note   CSN: [id number] Arrival date & time: 08/14/16  1103     History   Chief Complaint No chief complaint on file.   HPI Francisco [name] is a 70 y.o. male.  HPI  70 y.o. male with a hx of hypertension, hyperlipidemia, celiac disease, alcoholism, presents to the Emergency Department today vis EMS due to seizure like activity that was witnessed by roommate <1 hour ago. Noted activity between 1-2 minutes. Appears to be tonic clonic. Postctal on EMS arrival with associated nausea. Pt notes mild CP afterwards that he states is getting better. No SOB. No emesis. No ABD pain. No numbness/tingling. No headaches. No vision changes. Pt was initially admitted for seizures last 06/29/15, felt to be due to alcohol withdrawal. He had some agitation/confusion for a few days which improved. On 07/07/15 he had 2 generalized convulsions both with cardiac arrest requiring CPR. He was noted to be bradycardic then had complete heart block and had pacemaker placed. Routine EEG normal. Currently taking Levetiracetam ER 1051m Daily. Pt states he took his medications this morning. Notes no ETOH consumption today. States he drank 4 beers and 4oz vodka yesterday. Notes this is routine for him. No fevers. No other symptoms noted.    Neurologist- Dr. ADelice Lesch  Past Medical History:  Diagnosis Date  . Anemia   . Celiac disease   . GERD (gastroesophageal reflux disease)   . Heart murmur   . Hypertension   . Multiple duodenal ulcers   . Shortness of breath   . Symptomatic Bradycardia    a. 07/2015 s/p MDT Advisa L DC PPM (Ser #: [id number]).  . 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 Gout 12/17/2015  . Convulsions ([place] 08/17/2015  . Gait instability 08/17/2015  . Alcoholism /alcohol abuse ([place] 08/17/2015  . Cardiac device in situ, other   . Elevated troponin   . Cardiac arrest ([place]   .  Complete heart block ([place]   . Seizures ([place]   . Chronic diastolic congestive heart failure ([place]   . HLD (hyperlipidemia)   . Alcohol withdrawal ([place]   . Acute renal failure syndrome (HC[place]   . Hypokalemia   . Hypomagnesemia   . Absolute anemia   . Hypertrophic cardiomyopathy ([place]   . Diastolic CHF, acute on chronic (HCC)   . Essential hypertension   . Alcohol withdrawal seizure ([place] 06/29/2015  . Hypertrophic obstructive cardiomyopathy (HOCM) (HRozel 06/29/2015  . Mild diastolic dysfunction 040/34/7425 . Acute pain of right knee 06/29/2015  . Transaminitis 06/29/2015  . Acute renal failure (HDearborn [place] 06/29/2015  . High anion gap metabolic acidosis 095/63/8756 . Acute hypokalemia 06/29/2015  . Alcohol abuse, daily use 06/10/2015  . Hyperglycemia 06/09/2015  . Vitamin D deficiency 06/09/2015  . Hyperlipidemia 06/09/2015  . Bilateral lower extremity edema (chronic) 06/09/2015  . BPH (benign prostatic hypertrophy) 08/13/2014  . Multiple duodenal ulcers 07/03/2012  . Chest pain on exertion 07/02/2012  . GIB (gastrointestinal bleeding) 07/02/2012  . GERD (gastroesophageal reflux disease)   . Heart murmur   . Microcytic anemia 06/14/2012  . Generalized weakness 06/13/2012  . Celiac disease 06/13/2012  . HTN (hypertension) 06/13/2012  . Hyponatremia 06/13/2012    Past Surgical History:  Procedure Laterality Date  . CARDIAC CATHETERIZATION N/A 07/08/2015   Procedure: Left Heart Cath and Coronary Angiography;  Surgeon: TTroy Sine MD;  [facility];  Service: Cardiovascular;  Laterality: N/A;  . CARDIAC CATHETERIZATION N/A 07/08/2015   Procedure: Temporary Pacemaker;  Surgeon: Troy Sine, MD;  Location: [place] [facility];  Service: Cardiovascular;  Laterality: N/A;  . CARDIAC CATHETERIZATION N/A 07/09/2015   Procedure: Temporary Pacemaker;  Surgeon: Troy Sine, MD;  Location: [place] [facility];  Service: Cardiovascular;  Laterality: N/A;  . EP IMPLANTABLE  DEVICE N/A 07/09/2015   Procedure: Pacemaker Implant;  Surgeon: Will Meredith Leeds, MD;  Location: [place] [facility];  Service: Cardiovascular;  Laterality: N/A;  . ESOPHAGOGASTRODUODENOSCOPY  07/03/2012   Procedure: ESOPHAGOGASTRODUODENOSCOPY (EGD);  Surgeon: Winfield Cunas., MD;  Location: [facility] ENDOSCOPY;  Service: Endoscopy;  Laterality: N/A;  . HEMORRHOID SURGERY    . HERNIA REPAIR    . MOLE REMOVAL         Home Medications    Prior to Admission medications   Medication Sig Start Date End Date Taking? Authorizing Provider  allopurinol (ZYLOPRIM) 300 MG tablet TAKE 1 TABLET(300 MG) BY MOUTH DAILY 04/18/16   Orlena Sheldon, PA-C  amLODipine (NORVASC) 5 MG tablet TAKE 1 TABLET(5 MG) BY MOUTH DAILY 05/10/16   Orlena Sheldon, PA-C  Cholecalciferol (VITAMIN D3) 1000 UNITS CAPS Take 1 capsule (1,000 Units total) by mouth daily. 11/18/13   Lonie Peak Dixon, PA-C  clotrimazole-betamethasone (LOTRISONE) cream APPLY EXTERNALLY TO THE AFFECTED AREA TWICE DAILY 12/02/15   Orlena Sheldon, PA-C  colchicine 0.6 MG tablet Take 1 tablet (0.6 mg total) by mouth 2 (two) times daily. Take twice daily for 2 weeks, then use as needed for gout flare up 12/17/15   Orlena Sheldon, PA-C  ferrous sulfate 325 (65 FE) MG tablet Take 1 tablet (325 mg total) by mouth 2 (two) times daily with a meal. 07/12/15   Velvet Bathe, MD  furosemide (LASIX) 20 MG tablet TAKE 1 TABLET(20 MG) BY MOUTH DAILY 03/02/16   Orlena Sheldon, PA-C  levETIRAcetam (KEPPRA XR) 500 MG 24 hr tablet Take 2 tablets every morning 08/09/16   Cameron Sprang, MD  lisinopril-hydrochlorothiazide (PRINZIDE,ZESTORETIC) 20-25 MG tablet TAKE 1 TABLET BY MOUTH DAILY 03/02/16   Orlena Sheldon, PA-C  pantoprazole (PROTONIX) 40 MG tablet TAKE 1 TABLET BY MOUTH TWICE DAILY BEFORE A MEAL 07/13/16   Orlena Sheldon, PA-C  potassium chloride (K-DUR) 10 MEQ tablet TAKE 1 TABLET(10 MEQ) BY MOUTH DAILY 04/10/16   Orlena Sheldon, PA-C  tamsulosin (FLOMAX) 0.4 MG CAPS capsule TAKE ONE CAPSULE BY MOUTH  DAILY 03/02/16   Orlena Sheldon, PA-C    Family History Family History  Problem Relation Age of Onset  . Hodgkin's lymphoma Father     Social History Social History  Substance Use Topics  . Smoking status: Former Smoker    Packs/day: 0.50    Years: 50.00    Types: Cigarettes  . Smokeless tobacco: Former Systems developer  . Alcohol use 0.0 oz/week    1 - 2 Glasses of wine per week     Comment: ocassional     Allergies   Gluten meal   Review of Systems Review of Systems ROS reviewed and all are negative for acute change except as noted in the HPI.  Physical Exam Updated Vital Signs BP 134/64   Pulse 89   Temp 98.4 F (36.9 C) (Oral)   Resp 13   SpO2 95%   Physical Exam  Constitutional: He is oriented to person, place, and time. Vital signs are normal. He appears well-developed  and well-nourished.  HENT:  Head: Normocephalic and atraumatic.  Right Ear: Hearing normal.  Left Ear: Hearing normal.  Bitten tongue. No active bleeding  Eyes: Conjunctivae and EOM are normal. Pupils are equal, round, and reactive to light.  Neck: Normal range of motion. Neck supple.  Cardiovascular: Regular rhythm and intact distal pulses.  Tachycardia present.   Murmur heard. Pulmonary/Chest: Effort normal and breath sounds normal.  Musculoskeletal: Normal range of motion.  Neurological: He is alert and oriented to person, place, and time. He has normal strength. No cranial nerve deficit or sensory deficit.  Cranial Nerves:  II: Pupils equal, round, reactive to light III,IV, VI: ptosis not present, extra-ocular motions intact bilaterally  V,VII: smile symmetric, facial light touch sensation equal VIII: hearing grossly normal bilaterally  IX,X: midline uvula rise  XI: bilateral shoulder shrug equal and strong XII: midline tongue extension  Skin: Skin is warm and dry.  Psychiatric: He has a normal mood and affect. His speech is normal and behavior is normal. Thought content normal.  Nursing note  and vitals reviewed.  ED Treatments / Results  Labs (all labs ordered are listed, but only abnormal results are displayed) Labs Reviewed  BASIC METABOLIC PANEL - Abnormal; Notable for the following:       Result Value   Sodium 129 (*)    Potassium 3.2 (*)    Chloride 89 (*)    CO2 20 (*)    Glucose, Bld 187 (*)    GFR calc non Af Amer 57 (*)    Anion gap 20 (*)    All other components within normal limits  CBC - Abnormal; Notable for the following:    MCHC 36.1 (*)    All other components within normal limits  CBG MONITORING, ED - Abnormal; Notable for the following:    Glucose-Capillary 187 (*)    All other components within normal limits  ETHANOL  I-STAT TROPOININ, ED   EKG  EKG Interpretation None      Radiology Dg Chest Portable 1 View  Result Date: 08/14/2016 CLINICAL DATA:  Chest pain for 2 hours. EXAM: PORTABLE CHEST 1 VIEW COMPARISON:  07/10/2015 FINDINGS: 1139 hours. Low volume film with stable asymmetric elevation right hemidiaphragm. No evidence for focal airspace consolidation or airspace pulmonary edema. No substantial pleural effusion. Cardiopericardial silhouette is at upper limits of normal for size. Left delete permanent pacemaker again noted. Telemetry leads overlie the chest. IMPRESSION: Stable.  No acute cardiopulmonary findings. Electronically Signed   By: Misty Stanley M.D.   On: 08/14/2016 11:48   Procedures Procedures (including critical care time)  Medications Ordered in ED Medications  levETIRAcetam (KEPPRA XR) 24 hr tablet 500 mg (500 mg Oral Given 08/14/16 1339)  sodium chloride 0.9 % bolus 500 mL (0 mLs Intravenous Stopped 08/14/16 1245)   Initial Impression / Assessment and Plan / ED Course  I have reviewed the triage vital signs and the nursing notes.  Pertinent labs & imaging results that were available during my care of the patient were reviewed by me and considered in my medical decision making (see chart for details).  Clinical Course     Final Clinical Impressions(s) / ED Diagnoses  I have reviewed and evaluated the relevant laboratory values I have reviewed and evaluated the relevant imaging studies.  I have interpreted the relevant EKG. I have reviewed the relevant previous healthcare records. I have reviewed EMS Documentation. I obtained HPI from historian. Patient discussed with supervising physician  ED Course:  Assessment: Pt is a 17yM with hx hypertension, hyperlipidemia, celiac disease, alcoholism who presents with seizure like activity and chest pain <1 hour ago. Hx of seizure x 1 year ago. Pt was initially admitted for seizures last 06/29/15, felt to be due to alcohol withdrawal. He had some agitation/confusion for a few days which improved. On 07/07/15 he had 2 generalized convulsions both with cardiac arrest requiring CPR. He was noted to be bradycardic then had complete heart block and had pacemaker placed. Routine EEG normal. Currently taking Levetiracetam ER 1050m Daily. It was unclear if seizure was due to cardiac cause or if the seizure caused the arrhythmia. He has no clear epilepsy risk factors, routine EEG normal based on Neuro note on 08-07-16. On exam, pt in NAD. Nontoxic/nonseptic appearing. VSS. Afebrile. Lungs CTA. Heart RRR. Abdomen nontender soft. CN evaluated and unremarkable. CBC/BMP unremarkable. Trop negative. CXR negative. ETOH <5. Possible ETOH related seizure? Given fluids in ED. Plan is to Admit for Obs for CP rule out. Consult to Neurology (Dr. KLeonel Ramsay  recommended increasing Keppra to 15024mXR Daily. Given additional 50042mR in ED today.    Cath Report on 07-07-16 showed normal coronary arteries. EF 65%. Transvenous pacemaker was placed at that time due to HR 30s.    Disposition/Plan:  Admit Pt acknowledges and agrees with plan  Supervising Physician DavJulianne RiceD   Final diagnoses:  Seizure (HCThe [facility] FloridaChest pain, unspecified type    New Prescriptions New Prescriptions   No  medications on file     TylShary DecampA-C 08/14/16 1354    DavJulianne RiceD 08/20/16 [phone]

## 2016-08-14 NOTE — ED Triage Notes (Addendum)
Per EMS - pt had witnessed full body seizure at home last 1-2 minutes. Initially postictal somnolence w/ EMS. No hx seizures. Hx htn. Paced rhythm on monitor, has medtronic pacemaker. A&O x 4. Pt now complaining of substernal CP.

## 2016-08-14 NOTE — Progress Notes (Signed)
Received patient from ED. Placed on telemetry. CCMD called, verified. Vitals WNL, seizure pads placed and explained to pt. Will continue to monitor.  Cyndia Bent

## 2016-08-14 NOTE — Progress Notes (Signed)
Received critical lab value, troponin of 0.07, MD made aware.  Francisco [name]

## 2016-08-15 ENCOUNTER — Observation Stay (HOSPITAL_COMMUNITY): Payer: Commercial Managed Care - HMO

## 2016-08-15 DIAGNOSIS — R079 Chest pain, unspecified: Secondary | ICD-10-CM | POA: Diagnosis not present

## 2016-08-15 DIAGNOSIS — N400 Enlarged prostate without lower urinary tract symptoms: Secondary | ICD-10-CM | POA: Diagnosis not present

## 2016-08-15 DIAGNOSIS — I421 Obstructive hypertrophic cardiomyopathy: Secondary | ICD-10-CM | POA: Diagnosis not present

## 2016-08-15 DIAGNOSIS — K219 Gastro-esophageal reflux disease without esophagitis: Secondary | ICD-10-CM | POA: Diagnosis not present

## 2016-08-15 DIAGNOSIS — G40909 Epilepsy, unspecified, not intractable, without status epilepticus: Secondary | ICD-10-CM | POA: Diagnosis not present

## 2016-08-15 DIAGNOSIS — I5033 Acute on chronic diastolic (congestive) heart failure: Secondary | ICD-10-CM | POA: Diagnosis not present

## 2016-08-15 DIAGNOSIS — E559 Vitamin D deficiency, unspecified: Secondary | ICD-10-CM | POA: Diagnosis not present

## 2016-08-15 DIAGNOSIS — I110 Hypertensive heart disease with heart failure: Secondary | ICD-10-CM | POA: Diagnosis not present

## 2016-08-15 DIAGNOSIS — I442 Atrioventricular block, complete: Secondary | ICD-10-CM | POA: Diagnosis not present

## 2016-08-15 DIAGNOSIS — R4781 Slurred speech: Secondary | ICD-10-CM | POA: Diagnosis not present

## 2016-08-15 LAB — BASIC METABOLIC PANEL
Anion gap: 11 (ref 5–15)
BUN: <5 mg/dL — ABNORMAL LOW (ref 6–20)
CO2: 30 mmol/L (ref 22–32)
Calcium: 9.5 mg/dL (ref 8.9–10.3)
Chloride: 90 mmol/L — ABNORMAL LOW (ref 101–111)
Creatinine, Ser: 0.99 mg/dL (ref 0.61–1.24)
GFR calc Af Amer: >60 mL/min (ref >60)
GFR calc non Af Amer: >60 mL/min (ref >60)
Glucose, Bld: 111 mg/dL — ABNORMAL HIGH (ref 65–99)
Potassium: 3.2 mmol/L — ABNORMAL LOW (ref 3.5–5.1)
Sodium: 131 mmol/L — ABNORMAL LOW (ref 135–145)

## 2016-08-15 MED ORDER — POTASSIUM CHLORIDE CRYS ER 20 MEQ PO TBCR: 40.0000 meq | EXTENDED_RELEASE_TABLET | Freq: Once | ORAL | Status: DC

## 2016-08-15 MED ORDER — MAGNESIUM SULFATE 2 GM/50ML IV SOLN
2.0000 g | Freq: Once | INTRAVENOUS | Status: DC
  Filled 2016-08-15: qty 50

## 2016-08-15 NOTE — Progress Notes (Signed)
PROGRESS NOTE    [name]  [id number]:[id number] DOB: October 15, 1946 DOA: 08/14/2016 PCP: Francisco Juba, PA-C    Brief Narrative: Francisco [name] is a 70 y.o. 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 He did not fall.. Activity lasted about 2 mins, followed by chest pain after seizure activity. He denies any jawpain or left arm pain or tingling. He denies any shortness of breath. No fever chills or night sweats. No recent infection. He denies any headaches or vision changes. PAtient has not been seen by cardiologist in over 7 months. He smokes. His last drink was yesterday, 4 beers abd 4 oz liquor. He denies any withdrawal symptoms at this time. He denies any recreational drug use. No recent trips or infections. HE is compliant with meds Last hospitalization 1 year ago for seizures with convulsion. At the time he had cardiac arrest requiring CPR  And PMP as mentioned earlier.    Assessment & Plan:   Active Problems:   HTN (hypertension)   Hyponatremia   GERD (gastroesophageal reflux disease)   Hyperglycemia   Hyperlipidemia   Hypertrophic cardiomyopathy (HCC)   Diastolic CHF, acute on chronic (HCC)   Alcoholism /alcohol abuse ([place])   Gout   Chest pain   Seizure ([place])  1-Seizure; no further episode. keppra increase to 1500 daily  2-Chest pain, mild elevation troponin.  Chest pain free.  EKG dual pacemaker.  Check ECHO.  He got very upset with plan of care and wanted to go home. Agree to stay for ECHO.  Spoke with nurse patient now more calm , agree to stay.   3-alcohol use.  Ciwa.   4-Hyponatremia, hypokalemia. hyponatremia Replete.  IV fluids.   DVT prophylaxis: lovenox Code Status: full code  Family Communication: Care  discussed with patient  Disposition Plan: to be determine   Consultants:   none   Procedures: ECHO pending   Antimicrobials:  none   Subjective: Agitated, wants to go home  Denies chest pain   Objective: Vitals:   08/14/16 1630 08/14/16 2049 08/15/16 0629 08/15/16 0948  BP: (!) 166/79 129/61 (!) 137/51 138/70  Pulse: 86 78 (!) 58 67  Resp:  18 18   Temp:  98.2 F (36.8 C) 97.8 F (36.6 C)   TempSrc:  Oral Oral   SpO2: 99% 94% 94% 97%    Intake/Output Summary (Last 24 hours) at 08/15/16 1025 Last data filed at 08/15/16 0146  Gross per 24 hour  Intake              720 ml  Output             1150 ml  Net             -430 ml   There were no vitals filed for this visit.  Examination:  General exam: Appears calm and comfortable  Respiratory system: Clear to auscultation. Respiratory effort normal. Cardiovascular system: S1 & S2 heard, RRR. No JVD, murmurs, rubs, gallops or clicks. No pedal edema. Gastrointestinal system: Abdomen is nondistended, soft and nontender. No organomegaly or masses felt. Normal bowel sounds heard. Central nervous system: Alert and oriented. No focal neurological deficits. Extremities: Symmetric 5 x 5 power. Skin: No rashes, lesions or ulcers Psychiatry: Judgement  and insight appear normal. Mood & affect appropriate.     Data Reviewed: I have personally reviewed following labs and imaging studies  CBC:  Recent Labs Lab 08/14/16 1120  WBC 9.2  HGB 15.9  HCT 44.1  MCV 91.3  PLT 947   Basic Metabolic Panel:  Recent Labs Lab 08/14/16 1120 08/14/16 1853  NA 129*  --   K 3.2*  --   CL 89*  --   CO2 20*  --   GLUCOSE 187*  --   BUN 6  --   CREATININE 1.24  --   CALCIUM 8.9  --   MG  --  1.4*   GFR: Estimated Creatinine Clearance: 63.9 mL/min (by C-G formula based on SCr of 1.24 mg/dL). Liver Function Tests: No results for input(s): AST, ALT, ALKPHOS, BILITOT, PROT, ALBUMIN in the last 168 hours. No results for  input(s): LIPASE, AMYLASE in the last 168 hours. No results for input(s): AMMONIA in the last 168 hours. Coagulation Profile: No results for input(s): INR, PROTIME in the last 168 hours. Cardiac Enzymes:  Recent Labs Lab 08/14/16 1606 08/14/16 1853 08/14/16 2206  TROPONINI 0.07* 0.10* 0.09*   BNP (last 3 results) No results for input(s): PROBNP in the last 8760 hours. HbA1C: No results for input(s): HGBA1C in the last 72 hours. CBG:  Recent Labs Lab 08/14/16 1125  GLUCAP 187*   Lipid Profile: No results for input(s): CHOL, HDL, LDLCALC, TRIG, CHOLHDL, LDLDIRECT in the last 72 hours. Thyroid Function Tests: No results for input(s): TSH, T4TOTAL, FREET4, T3FREE, THYROIDAB in the last 72 hours. Anemia Panel: No results for input(s): VITAMINB12, FOLATE, FERRITIN, TIBC, IRON, RETICCTPCT in the last 72 hours. Sepsis Labs: No results for input(s): PROCALCITON, LATICACIDVEN in the last 168 hours.  No results found for this or any previous visit (from the past 240 hour(s)).       Radiology Studies: Ct Head Wo Contrast  Result Date: 08/15/2016 CLINICAL DATA:  Seizure, slurred speech. EXAM: CT HEAD WITHOUT CONTRAST TECHNIQUE: Contiguous axial images were obtained from the base of the skull through the vertex without intravenous contrast. COMPARISON:  06/29/2015 FINDINGS: Brain: Mild chronic small vessel disease throughout the deep white matter. Mild age related volume loss/ atrophy. 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 No acute intracranial abnormality. Specifically, no hemorrhage, hydrocephalus, mass lesion, acute infarction, or significant intracranial injury. Vascular: No hyperdense vessel or unexpected calcification. Skull: No acute calvarial abnormality. Sinuses/Orbits: Visualized paranasal sinuses and mastoids clear. Orbital soft tissues unremarkable. Other: None IMPRESSION: Atrophy, chronic small vessel disease. 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 No acute  intracranial abnormality. Electronically Signed   By: Rolm Baptise M.D.   On: 08/15/2016 09:28   Dg Chest Portable 1 View  Result Date: 08/14/2016 CLINICAL DATA:  Chest pain for 2 hours. EXAM: PORTABLE CHEST 1 VIEW COMPARISON:  07/10/2015 FINDINGS: 1139 hours. Low volume film with stable asymmetric elevation right hemidiaphragm. No evidence for focal airspace consolidation or airspace pulmonary edema. No substantial pleural effusion. Cardiopericardial silhouette is at upper limits of normal for size. Left delete permanent pacemaker again noted. Telemetry leads overlie the chest. IMPRESSION: Stable.  No acute cardiopulmonary findings. Electronically Signed   By: Misty Stanley M.D.   On: 08/14/2016 11:48        Scheduled Meds: . allopurinol  300 mg Oral Daily  . amLODipine  5 mg Oral Daily  . aspirin EC  325 mg Oral Daily  . colchicine  0.6 mg  Oral BID  . dextrose  1 ampule Intravenous Once  . enoxaparin (LOVENOX) injection  40 mg Subcutaneous Q24H  . folic acid  1 mg Oral Daily  . Influenza vac split quadrivalent PF  0.5 mL Intramuscular Tomorrow-1000  . levETIRAcetam  1,500 mg Oral Daily  . lisinopril  20 mg Oral Daily  . magnesium sulfate 1 - 4 g bolus IVPB  2 g Intravenous Once  . multivitamin with minerals  1 tablet Oral Daily  . pantoprazole  40 mg Oral BID  . potassium chloride  40 mEq Oral Once  . tamsulosin  0.4 mg Oral Daily  . thiamine  100 mg Oral Daily   Or  . thiamine  100 mg Intravenous Daily   Continuous Infusions: . sodium chloride 75 mL/hr at 08/14/16 1814     LOS: 0 days    Time spent: 25 minutes,     Elmarie Shiley, MD [facility] Pager [phone]  If 7PM-7AM, please contact night-coverage [email or web address] Password [email or web address] 08/15/2016, 10:25 AM

## 2016-08-15 NOTE — Care Management Obs Status (Signed)
[place] NOTIFICATION   Patient Details  Name: Francisco [name] MRN: [id number] Date of Birth: Oct 29, 1946   Medicare Observation Status Notification Given:  Yes    Dawayne Patricia, RN 08/15/2016, 4:44 PM

## 2016-08-15 NOTE — Progress Notes (Signed)
Pt became agitated while Dr. Tyrell Antonio was explaining his plan of care to him. She was stating she would like to have an Echocardiogram completed due to his chest pain and elevated troponins. I had Joy, Midwife speak with patient regarding this plan. She was able to convince him to have this done. CIWA score was 13, patient received 47m ativan, he has calmed down and is agreeable to treatment today. Will continue to monitor.  TCyndia Bent

## 2016-08-15 NOTE — Progress Notes (Signed)
Patient had been agreeable to stay this morning for ECHO. After being given his observation medicare paper from case management, he refused to stay. Made Dr. Tyrell Antonio aware, she did not want to discharge, she stated he could leave AMA if he wasn't willing to stay. Explained to pt that he would be leaving against medical advise and he would be responsible if anything happened after leaving. He demanded to leave and refused to sign AMA paper. Noted in paper chart. Removed IV, pt waiting on friend to pick him up and he walked out before we could wheel him down.  Cyndia Bent

## 2016-08-17 NOTE — Discharge Summary (Signed)
Physician Discharge Summary  Francisco [name] HWE:[id number] DOB: 1946-10-25 DOA: 08/14/2016  PCP: Karis Juba, PA-C  Admit date: 08/14/2016 Discharge date: 08/17/2016  Patient left AMA.  Patient aware that his dose of keppra was increase to 1500 mg daily.  Patient didn't wait for ECHO. Explain risk of leaving AMA>   Recommendations for Outpatient Follow-up:  1. Follow up with PCP in 1-2 weeks 2. Please obtain BMP/CBC in one week 3. Please follow up on the following pending results:   Brief/Interim Summary: 1-Seizure; no further episode. keppra increase to 1500 daily  2-Chest pain, mild elevation troponin.  Chest pain free.  EKG dual pacemaker.  Check ECHO.  He got very upset with plan of care and wanted to go home. Agree to stay for ECHO.  Spoke with nurse patient now more calm , agree to stay.   3-alcohol use.  Ciwa.   4-Hyponatremia, hypokalemia. hyponatremia Replete.  IV fluids.   Discharge Diagnoses:  Active Problems:   HTN (hypertension)   Hyponatremia   GERD (gastroesophageal reflux disease)   Hyperglycemia   Hyperlipidemia   Hypertrophic cardiomyopathy (HCC)   Diastolic CHF, acute on chronic (HCC)   Alcoholism /alcohol abuse ([place])   Gout   Chest pain   Seizure [facility])    Discharge Instructions     Medication List    ASK your doctor about these medications   ALEVE 220 MG tablet Generic drug:  naproxen sodium Take 220 mg by mouth 2 (two) times daily as needed (pain/ headache).   allopurinol 300 MG tablet Commonly known as:  ZYLOPRIM TAKE 1 TABLET(300 MG) BY MOUTH DAILY   amLODipine 5 MG tablet Commonly known as:  NORVASC TAKE 1 TABLET(5 MG) BY MOUTH DAILY   clotrimazole-betamethasone cream Commonly known as:  LOTRISONE APPLY EXTERNALLY TO THE AFFECTED AREA TWICE DAILY   colchicine 0.6 MG tablet Take 1 tablet (0.6 mg total) by mouth 2 (two) times daily. Take twice daily for 2 weeks, then use as needed for gout flare up   ferrous  sulfate 325 (65 FE) MG tablet Take 1 tablet (325 mg total) by mouth 2 (two) times daily with a meal.   furosemide 20 MG tablet Commonly known as:  LASIX TAKE 1 TABLET(20 MG) BY MOUTH DAILY   levETIRAcetam 500 MG 24 hr tablet Commonly known as:  KEPPRA XR Take 2 tablets every morning   lisinopril-hydrochlorothiazide 20-25 MG tablet Commonly known as:  PRINZIDE,ZESTORETIC TAKE 1 TABLET BY MOUTH DAILY   pantoprazole 40 MG tablet Commonly known as:  PROTONIX TAKE 1 TABLET BY MOUTH TWICE DAILY BEFORE A MEAL   potassium chloride 10 MEQ tablet Commonly known as:  K-DUR TAKE 1 TABLET(10 MEQ) BY MOUTH DAILY   tamsulosin 0.4 MG Caps capsule Commonly known as:  FLOMAX TAKE ONE CAPSULE BY MOUTH DAILY   Vitamin D3 1000 units Caps Take 1 capsule (1,000 Units total) by mouth daily.       Allergies  Allergen Reactions  . Gluten Meal Other (See Comments)    Celiac Disease - pt states on 08/14/16 that he has been eating gluten products for about 3 months with no reaction    Consultations:  none   Procedures/Studies: Ct Head Wo Contrast  Result Date: 08/15/2016 CLINICAL DATA:  Seizure, slurred speech. EXAM: CT HEAD WITHOUT CONTRAST TECHNIQUE: Contiguous axial images were obtained from the base of the skull through the vertex without intravenous contrast. COMPARISON:  06/29/2015 FINDINGS: Brain: Mild chronic small vessel disease throughout the deep white matter.  Mild age related volume loss/ atrophy. 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 No acute intracranial abnormality. Specifically, no hemorrhage, hydrocephalus, mass lesion, acute infarction, or significant intracranial injury. Vascular: No hyperdense vessel or unexpected calcification. Skull: No acute calvarial abnormality. Sinuses/Orbits: Visualized paranasal sinuses and mastoids clear. Orbital soft tissues unremarkable. Other: None IMPRESSION: Atrophy, chronic small vessel disease. Old lacunar infarcts on the  left. No acute intracranial abnormality. Electronically Signed   By: Rolm Baptise M.D.   On: 08/15/2016 09:28   Dg Chest Portable 1 View  Result Date: 08/14/2016 CLINICAL DATA:  Chest pain for 2 hours. EXAM: PORTABLE CHEST 1 VIEW COMPARISON:  07/10/2015 FINDINGS: 1139 hours. Low volume film with stable asymmetric elevation right hemidiaphragm. No evidence for focal airspace consolidation or airspace pulmonary edema. No substantial pleural effusion. Cardiopericardial silhouette is at upper limits of normal for size. Left delete permanent pacemaker again noted. Telemetry leads overlie the chest. IMPRESSION: Stable.  No acute cardiopulmonary findings. Electronically Signed   By: Misty Stanley M.D.   On: 08/14/2016 11:48      Discharge Exam: Vitals:   08/15/16 0948 08/15/16 1333  BP: 138/70 (!) 149/64  Pulse: 67 78  Resp:  18  Temp:     Vitals:   08/14/16 2049 08/15/16 0629 08/15/16 0948 08/15/16 1333  BP: 129/61 (!) 137/51 138/70 (!) 149/64  Pulse: 78 (!) 58 67 78  Resp: 18 18  18   Temp: 98.2 F (36.8 C) 97.8 F (36.6 C)    TempSrc: Oral Oral    SpO2: 94% 94% 97% 100%    General: Pt is alert, awake, not in acute distress Cardiovascular: RRR, S1/S2 +, no rubs, no gallops Respiratory: CTA bilaterally, no wheezing, no rhonchi Abdominal: Soft, NT, ND, bowel sounds + Extremities: no edema, no cyanosis    The results of significant diagnostics from this hospitalization (including imaging, microbiology, ancillary and laboratory) are listed below for reference.     Microbiology: No results found for this or any previous visit (from the past 240 hour(s)).   Labs: BNP (last 3 results) No results for input(s): BNP in the last 8760 hours. Basic Metabolic Panel:  Recent Labs Lab 08/14/16 1120 08/14/16 1853 08/15/16 1057  NA 129*  --  131*  K 3.2*  --  3.2*  CL 89*  --  90*  CO2 20*  --  30  GLUCOSE 187*  --  111*  BUN 6  --  <5*  CREATININE 1.24  --  0.99  CALCIUM 8.9  --   9.5  MG  --  1.4*  --    Liver Function Tests: No results for input(s): AST, ALT, ALKPHOS, BILITOT, PROT, ALBUMIN in the last 168 hours. No results for input(s): LIPASE, AMYLASE in the last 168 hours. No results for input(s): AMMONIA in the last 168 hours. CBC:  Recent Labs Lab 08/14/16 1120  WBC 9.2  HGB 15.9  HCT 44.1  MCV 91.3  PLT 222   Cardiac Enzymes:  Recent Labs Lab 08/14/16 1606 08/14/16 1853 08/14/16 2206  TROPONINI 0.07* 0.10* 0.09*   BNP: Invalid input(s): POCBNP CBG:  Recent Labs Lab 08/14/16 1125  GLUCAP 187*   D-Dimer No results for input(s): DDIMER in the last 72 hours. Hgb A1c No results for input(s): HGBA1C in the last 72 hours. Lipid Profile No results for input(s): CHOL, HDL, LDLCALC, TRIG, CHOLHDL, LDLDIRECT in the last 72 hours. Thyroid function studies No results for input(s): TSH, T4TOTAL, T3FREE, THYROIDAB in  the last 72 hours.  Invalid input(s): FREET3 Anemia work up No results for input(s): VITAMINB12, FOLATE, FERRITIN, TIBC, IRON, RETICCTPCT in the last 72 hours. Urinalysis    Component Value Date/Time   COLORURINE YELLOW 08/13/2014 1010   APPEARANCEUR CLEAR 08/13/2014 1010   LABSPEC 1.017 08/13/2014 1010   PHURINE 6.0 08/13/2014 1010   GLUCOSEU NEG 08/13/2014 1010   HGBUR NEG 08/13/2014 1010   BILIRUBINUR SMALL (A) 08/13/2014 1010   KETONESUR 15 (A) 08/13/2014 1010   PROTEINUR NEG 08/13/2014 1010   UROBILINOGEN 0.2 08/13/2014 1010   NITRITE NEG 08/13/2014 1010   LEUKOCYTESUR NEG 08/13/2014 1010   Sepsis Labs Invalid input(s): PROCALCITONIN,  WBC,  LACTICIDVEN Microbiology No results found for this or any previous visit (from the past 240 hour(s)).   Time coordinating discharge: Over 30 minutes  SIGNED:   Elmarie Shiley, MD  [facility] 08/17/2016, 5:46 PM Pager   If 7PM-7AM, please contact night-coverage [email or web address] Password [email or web address]

## 2016-08-21 ENCOUNTER — Other Ambulatory Visit: Payer: Self-pay | Admitting: Physician Assistant

## 2016-08-22 ENCOUNTER — Telehealth: Payer: Self-pay

## 2016-08-22 ENCOUNTER — Other Ambulatory Visit: Payer: Self-pay | Admitting: Physician Assistant

## 2016-08-22 NOTE — Telephone Encounter (Signed)
Rx refilled per protocol Pt need to have a f/u visit letter mailed

## 2016-08-22 NOTE — Telephone Encounter (Signed)
Pt called in about a Rx refill that has already been processed E-prescribed

## 2016-08-22 NOTE — Telephone Encounter (Signed)
Pt had refills that was filled for 30 days. Spoke with pt to sch follow up appt. Pt stated he had a seizure a few weeks ago and can not drive.

## 2016-08-24 ENCOUNTER — Ambulatory Visit (INDEPENDENT_AMBULATORY_CARE_PROVIDER_SITE_OTHER): Payer: Commercial Managed Care - HMO | Admitting: *Deleted

## 2016-08-24 ENCOUNTER — Telehealth: Payer: Self-pay | Admitting: Family Medicine

## 2016-08-24 DIAGNOSIS — I442 Atrioventricular block, complete: Secondary | ICD-10-CM

## 2016-08-24 NOTE — Telephone Encounter (Signed)
Pt needs [employer] referral for continuing visits with Cardiology Dr Curt Bears.  Approved # [id number] for 6 visit s form 08/24/16 - 02/21/17  Dx Y33.38 and V29.0

## 2016-08-25 ENCOUNTER — Encounter: Payer: Self-pay | Admitting: Cardiology

## 2016-08-25 NOTE — Progress Notes (Signed)
Remote pacemaker transmission.   

## 2016-08-29 ENCOUNTER — Other Ambulatory Visit: Payer: Self-pay

## 2016-08-29 ENCOUNTER — Telehealth: Payer: Self-pay | Admitting: Neurology

## 2016-08-29 ENCOUNTER — Other Ambulatory Visit: Payer: Self-pay | Admitting: Physician Assistant

## 2016-08-29 DIAGNOSIS — R569 Unspecified convulsions: Secondary | ICD-10-CM

## 2016-08-29 MED ORDER — LEVETIRACETAM ER 500 MG PO TB24: ORAL_TABLET | ORAL | 3 refills | Status: DC

## 2016-08-29 NOTE — Telephone Encounter (Signed)
He should be on Keppra XR 54m, pls send in Rx for 3 tabs daily. Pls let him know that as per [place] driving laws, no driving after a seizure until 6 months seizure-free. Thanks

## 2016-08-29 NOTE — Telephone Encounter (Signed)
VM-PT left a message regarding a prescription, did not say what the name was/Dawn CB# [phone]

## 2016-08-29 NOTE — Telephone Encounter (Signed)
Tried calling patient no VM set up.

## 2016-08-29 NOTE — Telephone Encounter (Signed)
VM-PT returned your call/Dawn CB# [phone]

## 2016-08-29 NOTE — Telephone Encounter (Signed)
Patient called and states he wants [place] to call in RX for [place]. He states when he was just in hospital with for seizure they increased it to 3 tabs a day. Is this okay to call in?

## 2016-08-29 NOTE — Telephone Encounter (Signed)
Keppra XR 559m sent to pharmacy. Advised patient of below.

## 2016-08-31 ENCOUNTER — Ambulatory Visit (INDEPENDENT_AMBULATORY_CARE_PROVIDER_SITE_OTHER): Payer: Commercial Managed Care - HMO | Admitting: Physician Assistant

## 2016-08-31 ENCOUNTER — Encounter: Payer: Self-pay | Admitting: Physician Assistant

## 2016-08-31 VITALS — BP 118/58 | HR 78 | Temp 98.3°F | Resp 18 | Ht 69.0 in | Wt 214.0 lb

## 2016-08-31 DIAGNOSIS — Z09 Encounter for follow-up examination after completed treatment for conditions other than malignant neoplasm: Secondary | ICD-10-CM | POA: Diagnosis not present

## 2016-08-31 DIAGNOSIS — Z79899 Other long term (current) drug therapy: Secondary | ICD-10-CM | POA: Diagnosis not present

## 2016-08-31 LAB — BASIC METABOLIC PANEL WITH GFR
BUN: 9 mg/dL (ref 7–25)
CO2: 30 mmol/L (ref 20–31)
Calcium: 9.8 mg/dL (ref 8.6–10.3)
Chloride: 87 mmol/L — ABNORMAL LOW (ref 98–110)
Creat: 1.05 mg/dL (ref 0.70–1.18)
GFR, Est African American: 83 mL/min (ref >=60)
GFR, Est Non African American: 72 mL/min (ref >=60)
Glucose, Bld: 140 mg/dL — ABNORMAL HIGH (ref 70–99)
Potassium: 3.9 mmol/L (ref 3.5–5.3)
Sodium: 128 mmol/L — ABNORMAL LOW (ref 135–146)

## 2016-08-31 LAB — CBC WITH DIFFERENTIAL/PLATELET
Basophils Absolute: 0 cells/uL (ref 0–200)
Basophils Relative: 0 %
Eosinophils Absolute: 81 cells/uL (ref 15–500)
Eosinophils Relative: 1 %
HCT: 49.4 % (ref 38.5–50.0)
Hemoglobin: 17 g/dL (ref 13.0–17.0)
Lymphocytes Relative: 13 %
Lymphs Abs: 1053 cells/uL (ref 850–3900)
MCH: 33 pg (ref 27.0–33.0)
MCHC: 34.4 g/dL (ref 32.0–36.0)
MCV: 95.9 fL (ref 80.0–100.0)
MPV: 10.5 fL (ref 7.5–12.5)
Monocytes Absolute: 648 cells/uL (ref 200–950)
Monocytes Relative: 8 %
Neutro Abs: 6318 cells/uL (ref 1500–7800)
Neutrophils Relative %: 78 %
Platelets: 382 10*3/uL (ref 140–400)
RBC: 5.15 MIL/uL (ref 4.20–5.80)
RDW: 14.9 % (ref 11.0–15.0)
WBC: 8.1 10*3/uL (ref 3.8–10.8)

## 2016-08-31 NOTE — Progress Notes (Signed)
Patient ID: Francisco [name] MRN: [id number], DOB: March 02, 1946, 70 y.o. Date of Encounter: 08/31/2016, 1:11 PM    Chief Complaint:  Chief Complaint  Patient presents with  . Hospital discharge follow up     HPI: 70 y.o. year old male here for follow-up after recent hospitalization.  I reviewed hospital discharge summary from 08/14/16 through 08/17/16. Also reviewed that that discharge summary states for [place] to obtain BMP and CBC at this appointment.  Today patient states that he has been feeling stable since his discharge home. He has been feeling like he is back to his usual baseline. Has had no seizure. No chest pain. He has no complaints or concerns today.   Home Meds:   Outpatient Medications Prior to Visit  Medication Sig Dispense Refill  . allopurinol (ZYLOPRIM) 300 MG tablet TAKE 1 TABLET(300 MG) BY MOUTH DAILY 30 tablet 5  . amLODipine (NORVASC) 5 MG tablet TAKE 1 TABLET BY MOUTH DAILY 30 tablet 0  . Cholecalciferol (VITAMIN D3) 1000 UNITS CAPS Take 1 capsule (1,000 Units total) by mouth daily. 30 capsule 11  . clotrimazole-betamethasone (LOTRISONE) cream APPLY EXTERNALLY TO THE AFFECTED AREA TWICE DAILY (Patient taking differently: APPLY EXTERNALLY TO THE AFFECTED AREA TWICE DAILY AS NEEDED FOR PSORIASIS FLARES) 45 g 1  . colchicine 0.6 MG tablet Take 1 tablet (0.6 mg total) by mouth 2 (two) times daily. Take twice daily for 2 weeks, then use as needed for gout flare up (Patient taking differently: Take 0.6 mg by mouth 2 (two) times daily as needed (gout flare). ) 60 tablet 0  . ferrous sulfate 325 (65 FE) MG tablet Take 1 tablet (325 mg total) by mouth 2 (two) times daily with a meal. (Patient taking differently: Take 325 mg by mouth daily. ) 60 tablet 0  . furosemide (LASIX) 20 MG tablet TAKE 1 TABLET(20 MG) BY MOUTH DAILY 90 tablet 1  . levETIRAcetam (KEPPRA XR) 500 MG 24 hr tablet Take 3 tablets daily. 180 tablet 3  . lisinopril-hydrochlorothiazide (PRINZIDE,ZESTORETIC)  20-25 MG tablet TAKE 1 TABLET BY MOUTH DAILY 90 tablet 1  . pantoprazole (PROTONIX) 40 MG tablet TAKE 1 TABLET BY MOUTH TWICE DAILY BEFORE A MEAL 180 tablet 0  . potassium chloride (K-DUR) 10 MEQ tablet TAKE 1 TABLET(10 MEQ) BY MOUTH DAILY 30 tablet 0  . tamsulosin (FLOMAX) 0.4 MG CAPS capsule TAKE ONE CAPSULE BY MOUTH DAILY 90 capsule 0  . naproxen sodium (ALEVE) 220 MG tablet Take 220 mg by mouth 2 (two) times daily as needed (pain/ headache).     No facility-administered medications prior to visit.     Allergies:  Allergies  Allergen Reactions  . Gluten Meal Other (See Comments)    Celiac Disease - pt states on 08/14/16 that he has been eating gluten products for about 3 months with no reaction      Review of Systems: See HPI for pertinent ROS. All other ROS negative.    Physical Exam: Blood pressure (!) 118/58, pulse 78, temperature 98.3 F (36.8 C), temperature source Oral, resp. rate 18, height 5' 9"  (1.753 m), weight 214 lb (97.1 kg), SpO2 97 %., Body mass index is 31.6 kg/m. General:  WNWD WM. Appears in no acute distress. Neck: Supple. No thyromegaly. No lymphadenopathy. Lungs: Clear bilaterally to auscultation without wheezes, rales, or rhonchi. Breathing is unlabored. Heart: Regular rhythm. III/VI murmur Msk:  Strength and tone normal for age. Extremities/Skin: Warm and dry.  No edema. Neuro: Alert and oriented X 3.  Moves all extremities spontaneously. Gait is normal. CNII-XII grossly in tact. Psych:  Responds to questions appropriately with a normal affect.     ASSESSMENT AND PLAN:  70 y.o. year old male with  1. Hospital discharge follow-up Will obtain labs for follow-up. Clinically, he is stable since discharge from the hospital.  - CBC with Differential/Platelet - BASIC METABOLIC PANEL WITH GFR   Signed, [address] [place], [place], [facility] 08/31/2016 1:11 PM

## 2016-09-02 ENCOUNTER — Other Ambulatory Visit: Payer: Self-pay | Admitting: Physician Assistant

## 2016-09-19 LAB — CUP PACEART REMOTE DEVICE CHECK
Battery Impedance: 134 Ohm
Battery Remaining Longevity: 143 mo
Battery Voltage: 2.79 V
Brady Statistic AP VP Percent: 4 %
Brady Statistic AP VS Percent: 0 %
Brady Statistic AS VP Percent: 96 %
Brady Statistic AS VS Percent: 0 %
Date Time Interrogation Session: 20171019142005
Implantable Lead Implant Date: 20160902
Implantable Lead Implant Date: 20160902
Implantable Lead Location: 753859
Implantable Lead Location: 753860
Implantable Lead Model: 5076
Implantable Lead Model: 5076
Implantable Pulse Generator Implant Date: 20160902
Lead Channel Impedance Value: 551 Ohm
Lead Channel Impedance Value: 630 Ohm
Lead Channel Pacing Threshold Amplitude: 0.75 V
Lead Channel Pacing Threshold Amplitude: 1 V
Lead Channel Pacing Threshold Pulse Width: 0.4 ms
Lead Channel Pacing Threshold Pulse Width: 0.4 ms
Lead Channel Sensing Intrinsic Amplitude: 2.8 mV
Lead Channel Setting Pacing Amplitude: 1.625
Lead Channel Setting Pacing Amplitude: 2 V
Lead Channel Setting Pacing Pulse Width: 0.4 ms
Lead Channel Setting Sensing Sensitivity: 4 mV

## 2016-09-22 ENCOUNTER — Other Ambulatory Visit: Payer: Self-pay | Admitting: Physician Assistant

## 2016-10-18 ENCOUNTER — Encounter: Payer: Self-pay | Admitting: Cardiology

## 2016-10-19 ENCOUNTER — Telehealth: Payer: Self-pay | Admitting: Cardiology

## 2016-10-19 NOTE — Telephone Encounter (Signed)
Francisco [name] is retuning a call

## 2016-10-19 NOTE — Telephone Encounter (Signed)
Explained that I cannot find anything in chart where someone called him. Informed him that our automated computer system calls 3 days before appt as a reminder.  He states that was probably the computer.  Informed pt of date/time of follow up.  States he will see [place] next week.

## 2016-10-24 ENCOUNTER — Ambulatory Visit (INDEPENDENT_AMBULATORY_CARE_PROVIDER_SITE_OTHER): Payer: Commercial Managed Care - HMO | Admitting: Cardiology

## 2016-10-24 ENCOUNTER — Encounter: Payer: Self-pay | Admitting: Cardiology

## 2016-10-24 VITALS — BP 138/70 | HR 80 | Ht 69.0 in | Wt 211.0 lb

## 2016-10-24 DIAGNOSIS — I442 Atrioventricular block, complete: Secondary | ICD-10-CM | POA: Diagnosis not present

## 2016-10-24 MED ORDER — METOPROLOL TARTRATE 25 MG PO TABS: 25.0000 mg | ORAL_TABLET | Freq: Two times a day (BID) | ORAL | 3 refills | Status: DC

## 2016-10-24 NOTE — Patient Instructions (Signed)
Medication Instructions: Start Metoprolol 25 mg twice daily  Labwork: None ordered  Procedures/Testing: None ordered  Follow-Up Remote monitoring is used to monitor your Pacemaker of ICD from home. This monitoring reduces the number of office visits required to check your device to one time per year. It allows [place] to keep an eye on the functioning of your device to ensure it is working properly. You are scheduled for a device check from home on 01/24/2016. You may send your transmission at any time that day. If you have a wireless device, the transmission will be sent automatically. After your physician reviews your transmission, you will receive a postcard with your next transmission date.    Your physician recommends that you schedule a follow-up appointment in: 1 year with Dr. Curt Bears.  Any Additional Special Instructions Will Be Listed Below (If Applicable).     If you need a refill on your cardiac medications before your next appointment, please call your pharmacy.

## 2016-10-24 NOTE — Progress Notes (Signed)
Electrophysiology Office Note   Date:  10/24/2016   ID:  Alson, Mcpheeters 1946-09-24, MRN [id number]  PCP:  Karis Juba, PA-C  Primary Electrophysiologist:  Constance Haw, MD    Chief Complaint  Patient presents with  . Pacemaker Check    Complete heart block     History of Present Illness: Francisco [name] is a 70 y.o. male who presents today for electrophysiology evaluation.    He presented to the hospital in September for the evaluation of seizures. At that time he was found to be in complete heart block with a ventricular rate in the 30s and altered mental mental status requiring transcutaneous pacing. He had an urgent temporary pacemaker placed which dislodged and ultimately had a dual chamber pacemaker placed.    Today, he denies symptoms of palpitations, chest pain, shortness of breath, orthopnea, PND, lower extremity edema, claudication, dizziness, presyncope, syncope, bleeding, or neurologic sequela. The patient is tolerating medications without difficulties and is otherwise without complaint today. He did have a seizure, and had his Keppra increased. Since that time, he has done well. He has some high ventricular rates on his device interrogation. He is also noted some pain over his pacemaker site.   Past Medical History:  Diagnosis Date  . Anemia   . Celiac disease   . GERD (gastroesophageal reflux disease)   . Heart murmur   . Hypertension   . Multiple duodenal ulcers   . Shortness of breath   . Symptomatic Bradycardia    a. 07/2015 s/p MDT Advisa L DC PPM (Ser #: [id number]).  . 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 CARDIAC CATHETERIZATION N/A 07/08/2015   Procedure: Left Heart Cath and Coronary Angiography;  Surgeon: Troy Sine, MD;  Location: [facility];  Service: Cardiovascular;  Laterality: N/A;  . CARDIAC CATHETERIZATION N/A 07/08/2015   Procedure:  Temporary Pacemaker;  Surgeon: Troy Sine, MD;  Location: [facility];  Service: Cardiovascular;  Laterality: N/A;  . CARDIAC CATHETERIZATION N/A 07/09/2015   Procedure: Temporary Pacemaker;  Surgeon: Troy Sine, MD;  Location: [facility];  Service: Cardiovascular;  Laterality: N/A;  . EP IMPLANTABLE DEVICE N/A 07/09/2015   Procedure: Pacemaker Implant;  Surgeon: Will Meredith Leeds, MD;  Location: [facility];  Service: Cardiovascular;  Laterality: N/A;  . ESOPHAGOGASTRODUODENOSCOPY  07/03/2012   Procedure: ESOPHAGOGASTRODUODENOSCOPY (EGD);  Surgeon: Winfield Cunas., MD;  Location[facility];  Service: Endoscopy;  Laterality: N/A;  . HEMORRHOID SURGERY    . HERNIA REPAIR    . MOLE REMOVAL       Current Outpatient Prescriptions  Medication Sig Dispense Refill  . allopurinol (ZYLOPRIM) 300 MG tablet TAKE 1 TABLET(300 MG) BY MOUTH DAILY 90 tablet 5  . amLODipine (NORVASC) 5 MG tablet TAKE 1 TABLET BY MOUTH DAILY 90 tablet 0  . clotrimazole-betamethasone (LOTRISONE) cream APPLY EXTERNALLY TO THE AFFECTED AREA TWICE DAILY (Patient taking differently: APPLY EXTERNALLY TO THE AFFECTED AREA TWICE DAILY AS NEEDED FOR PSORIASIS FLARES) 45 g 1  . colchicine 0.6 MG tablet Take 1 tablet (0.6 mg total) by mouth 2 (two) times daily. Take twice daily for 2 weeks, then use as needed for gout flare up (Patient taking differently: Take 0.6 mg by mouth 2 (two) times daily as needed (gout flare). ) 60 tablet 0  . ferrous sulfate 325 (65 FE) MG tablet Take 1 tablet (  325 mg total) by mouth 2 (two) times daily with a meal. (Patient taking differently: Take 325 mg by mouth daily. ) 60 tablet 0  . furosemide (LASIX) 20 MG tablet TAKE 1 TABLET(20 MG) BY MOUTH DAILY 90 tablet 0  . levETIRAcetam (KEPPRA XR) 500 MG 24 hr tablet Take 3 tablets daily. 180 tablet 3  . lisinopril-hydrochlorothiazide (PRINZIDE,ZESTORETIC) 20-25 MG tablet TAKE 1 TABLET BY MOUTH DAILY 90 tablet 0  . naproxen  sodium (ALEVE) 220 MG tablet Take 220 mg by mouth 2 (two) times daily as needed (pain/ headache).    . pantoprazole (PROTONIX) 40 MG tablet TAKE 1 TABLET BY MOUTH TWICE DAILY BEFORE A MEAL 180 tablet 0  . potassium chloride (K-DUR) 10 MEQ tablet TAKE 1 TABLET(10 MEQ) BY MOUTH DAILY 90 tablet 0  . tamsulosin (FLOMAX) 0.4 MG CAPS capsule TAKE ONE CAPSULE BY MOUTH DAILY 90 capsule 0   No current facility-administered medications for this visit.     Allergies:   Gluten meal   Social History:  The patient  reports that he has quit smoking. His smoking use included Cigarettes. He has a 25.00 pack-year smoking history. He has quit using smokeless tobacco. He reports that he drinks alcohol. He reports that he does not use drugs.   Family History:  The patient's family history includes Hodgkin's lymphoma in his father.    ROS:  Please see the history of present illness.   All other systems are reviewed and positive for none.    PHYSICAL EXAM: VS:  BP 138/70   Pulse 80   Ht 5' 9"  (1.753 m)   Wt 211 lb (95.7 kg)   BMI 31.16 kg/m  , BMI Body mass index is 31.16 kg/m. GEN: Well nourished, well developed, in no acute distress  HEENT: normal  Neck: no JVD, carotid bruits, or masses Cardiac: RRR; no murmurs, rubs, or gallops,no edema  Respiratory:  clear to auscultation bilaterally, normal work of breathing GI: soft, nontender, nondistended, + BS MS: no deformity or atrophy  Skin: warm and dry,  device pocket is well healed Neuro:  Strength and sensation are intact Psych: euthymic mood, full affect  EKG:  EKG is not ordered today. Personal review of the ekg ordered 10/10/17shows A sense, V pace  Device interrogation is reviewed today in detail.  See PaceArt for details.   Recent Labs: 12/13/2015: ALT 13 08/14/2016: Magnesium 1.4 08/31/2016: BUN 9; Creat 1.05; Hemoglobin 17.0; Platelets 382; Potassium 3.9; Sodium 128    Lipid Panel     Component Value Date/Time   CHOL 201 (H)  06/09/2015 1014   TRIG 59 06/09/2015 1014   HDL 124 06/09/2015 1014   CHOLHDL 1.6 06/09/2015 1014   VLDL 12 06/09/2015 1014   LDLCALC 65 06/09/2015 1014     Wt Readings from Last 3 Encounters:  10/24/16 211 lb (95.7 kg)  08/31/16 214 lb (97.1 kg)  08/09/16 215 lb 6 oz (97.7 kg)      Other studies Reviewed: Additional studies/ records that were reviewed today include: 07/08/15 Cath Review of the above records today demonstrates:  Complete heart block with ventricular rates in the low 30s requiring insertion of a transvenous pacemaker.  Normal coronary arteries.  Hyperdynamic LV function with an ejection fraction of a aproximately 65%.    ASSESSMENT AND PLAN:  1.  Complete heart block: patient presented to the hospital with complete heart block. Received dual-chamber pacemaker which is currently functioning well without any problems. He is not having any  complaints related to his pacemaker. Device site without current issues. No indication for pocket revision.  2. Hypertension: well controlled, no changes necessary   3. Ventricular high rate episodes: Seen on pacemaker interrogation. We'll start 25 mg of metoprolol twice a day.   Current medicines are reviewed at length with the patient today.   The patient does not have concerns regarding his medicines.  The following changes were made today:  none  Labs/ tests ordered today include:  No orders of the defined types were placed in this encounter.    Disposition:   FU with Will Camnitz 9 months  Signed, Will Meredith Leeds, MD  10/24/2016 11:17 AM     [facility] [address] [place] [address] [phone] (office) [phone] (fax)

## 2016-10-25 LAB — CUP PACEART INCLINIC DEVICE CHECK
Battery Impedance: 134 Ohm
Battery Remaining Longevity: 139 mo
Battery Voltage: 2.79 V
Brady Statistic AP VP Percent: 4 %
Brady Statistic AP VS Percent: 0 %
Brady Statistic AS VP Percent: 96 %
Brady Statistic AS VS Percent: 0 %
Date Time Interrogation Session: 20171219173214
Implantable Lead Implant Date: 20160902
Implantable Lead Implant Date: 20160902
Implantable Lead Location: 753859
Implantable Lead Location: 753860
Implantable Lead Model: 5076
Implantable Lead Model: 5076
Implantable Pulse Generator Implant Date: 20160902
Lead Channel Impedance Value: 530 Ohm
Lead Channel Impedance Value: 620 Ohm
Lead Channel Pacing Threshold Amplitude: 0.5 V
Lead Channel Pacing Threshold Amplitude: 1 V
Lead Channel Pacing Threshold Pulse Width: 0.4 ms
Lead Channel Pacing Threshold Pulse Width: 0.4 ms
Lead Channel Sensing Intrinsic Amplitude: 4 mV
Lead Channel Setting Pacing Amplitude: 1.75 V
Lead Channel Setting Pacing Amplitude: 2.25 V
Lead Channel Setting Pacing Pulse Width: 0.4 ms
Lead Channel Setting Sensing Sensitivity: 4 mV

## 2016-11-27 ENCOUNTER — Other Ambulatory Visit: Payer: Self-pay | Admitting: Physician Assistant

## 2016-11-28 NOTE — Telephone Encounter (Signed)
Rx filled per protocol  

## 2016-12-15 ENCOUNTER — Other Ambulatory Visit: Payer: Self-pay | Admitting: Neurology

## 2016-12-15 ENCOUNTER — Other Ambulatory Visit: Payer: Self-pay | Admitting: Physician Assistant

## 2016-12-15 DIAGNOSIS — R569 Unspecified convulsions: Secondary | ICD-10-CM

## 2016-12-15 NOTE — Telephone Encounter (Signed)
RX refill sent to pharmacy. Per last phone note patient take 3 tablets daily.

## 2016-12-23 ENCOUNTER — Other Ambulatory Visit: Payer: Self-pay | Admitting: Physician Assistant

## 2016-12-25 NOTE — Telephone Encounter (Signed)
Pt need OV letter mailed. Rx filled per protocol

## 2017-01-23 ENCOUNTER — Telehealth: Payer: Self-pay | Admitting: Cardiology

## 2017-01-23 ENCOUNTER — Ambulatory Visit (INDEPENDENT_AMBULATORY_CARE_PROVIDER_SITE_OTHER): Payer: Medicare HMO | Admitting: *Deleted

## 2017-01-23 DIAGNOSIS — I442 Atrioventricular block, complete: Secondary | ICD-10-CM | POA: Diagnosis not present

## 2017-01-23 NOTE — Progress Notes (Signed)
Remote pacemaker transmission.   

## 2017-01-23 NOTE — Telephone Encounter (Signed)
Attempted to confirm remote transmission with pt. No answer and was unable to leave a message.   

## 2017-01-24 ENCOUNTER — Encounter: Payer: Self-pay | Admitting: Cardiology

## 2017-01-24 LAB — CUP PACEART REMOTE DEVICE CHECK
Battery Impedance: 134 Ohm
Battery Remaining Longevity: 143 mo
Battery Voltage: 2.79 V
Brady Statistic AP VP Percent: 7 %
Brady Statistic AP VS Percent: 0 %
Brady Statistic AS VP Percent: 93 %
Brady Statistic AS VS Percent: 0 %
Date Time Interrogation Session: 20180320172824
Implantable Lead Implant Date: 20160902
Implantable Lead Implant Date: 20160902
Implantable Lead Location: 753859
Implantable Lead Location: 753860
Implantable Lead Model: 5076
Implantable Lead Model: 5076
Implantable Pulse Generator Implant Date: 20160902
Lead Channel Impedance Value: 502 Ohm
Lead Channel Impedance Value: 590 Ohm
Lead Channel Pacing Threshold Amplitude: 0.75 V
Lead Channel Pacing Threshold Amplitude: 1 V
Lead Channel Pacing Threshold Pulse Width: 0.4 ms
Lead Channel Pacing Threshold Pulse Width: 0.4 ms
Lead Channel Setting Pacing Amplitude: 1.5 V
Lead Channel Setting Pacing Amplitude: 2 V
Lead Channel Setting Pacing Pulse Width: 0.4 ms
Lead Channel Setting Sensing Sensitivity: 4 mV

## 2017-01-30 ENCOUNTER — Telehealth: Payer: Self-pay

## 2017-01-30 DIAGNOSIS — I4729 Other ventricular tachycardia: Secondary | ICD-10-CM

## 2017-01-30 DIAGNOSIS — I472 Ventricular tachycardia: Secondary | ICD-10-CM

## 2017-01-30 MED ORDER — METOPROLOL TARTRATE 50 MG PO TABS: 50.0000 mg | ORAL_TABLET | Freq: Two times a day (BID) | ORAL | 3 refills | Status: DC

## 2017-01-30 NOTE — Telephone Encounter (Signed)
-----   Message from Will Meredith Leeds, MD sent at 01/27/2017  4:42 PM EDT ----- Abnormal device interrogation reviewed.  Lead parameters and battery status stable.  Short run of NSVT seen. Increase metoprolol to 50 mg BID.

## 2017-01-30 NOTE — Telephone Encounter (Signed)
Spoke with pt regarding Dr. Curt Bears recommendation to increase Lopressor from 27m to 529m twice a day. Asked pt if he had a way to check his BP at home pt stated that he did not advised pt that if it was possible for him to acquire him it would be beneficial. Informed pt that if when increasing this dose of medication that he started developing dizziness or syncope he should call the clinic. Pt voiced understanding.

## 2017-02-08 ENCOUNTER — Encounter: Payer: Self-pay | Admitting: Physician Assistant

## 2017-02-22 ENCOUNTER — Encounter: Payer: Self-pay | Admitting: Physician Assistant

## 2017-02-22 ENCOUNTER — Ambulatory Visit (INDEPENDENT_AMBULATORY_CARE_PROVIDER_SITE_OTHER): Payer: Commercial Managed Care - HMO | Admitting: Physician Assistant

## 2017-02-22 VITALS — BP 110/60 | HR 64 | Temp 98.1°F | Resp 18 | Wt 209.2 lb

## 2017-02-22 DIAGNOSIS — I5032 Chronic diastolic (congestive) heart failure: Secondary | ICD-10-CM | POA: Diagnosis not present

## 2017-02-22 DIAGNOSIS — I10 Essential (primary) hypertension: Secondary | ICD-10-CM

## 2017-02-22 DIAGNOSIS — I442 Atrioventricular block, complete: Secondary | ICD-10-CM | POA: Diagnosis not present

## 2017-02-22 DIAGNOSIS — J988 Other specified respiratory disorders: Secondary | ICD-10-CM | POA: Diagnosis not present

## 2017-02-22 DIAGNOSIS — B9689 Other specified bacterial agents as the cause of diseases classified elsewhere: Secondary | ICD-10-CM

## 2017-02-22 DIAGNOSIS — I421 Obstructive hypertrophic cardiomyopathy: Secondary | ICD-10-CM | POA: Diagnosis not present

## 2017-02-22 DIAGNOSIS — F1010 Alcohol abuse, uncomplicated: Secondary | ICD-10-CM | POA: Diagnosis not present

## 2017-02-22 DIAGNOSIS — E785 Hyperlipidemia, unspecified: Secondary | ICD-10-CM | POA: Diagnosis not present

## 2017-02-22 DIAGNOSIS — R739 Hyperglycemia, unspecified: Secondary | ICD-10-CM | POA: Diagnosis not present

## 2017-02-22 DIAGNOSIS — M1000 Idiopathic gout, unspecified site: Secondary | ICD-10-CM | POA: Diagnosis not present

## 2017-02-22 DIAGNOSIS — K900 Celiac disease: Secondary | ICD-10-CM | POA: Diagnosis not present

## 2017-02-22 LAB — LIPID PANEL
Cholesterol: 154 mg/dL (ref <200)
HDL: 69 mg/dL (ref >40)
LDL Cholesterol: 72 mg/dL (ref <100)
Total CHOL/HDL Ratio: 2.2 Ratio (ref <5.0)
Triglycerides: 67 mg/dL (ref <150)
VLDL: 13 mg/dL (ref <30)

## 2017-02-22 LAB — COMPLETE METABOLIC PANEL WITH GFR
ALT: 16 U/L (ref 9–46)
AST: 28 U/L (ref 10–35)
Albumin: 3.8 g/dL (ref 3.6–5.1)
Alkaline Phosphatase: 71 U/L (ref 40–115)
BUN: 8 mg/dL (ref 7–25)
CO2: 29 mmol/L (ref 20–31)
Calcium: 9.1 mg/dL (ref 8.6–10.3)
Chloride: 85 mmol/L — ABNORMAL LOW (ref 98–110)
Creat: 0.93 mg/dL (ref 0.70–1.18)
GFR, Est African American: >89 mL/min (ref >=60)
GFR, Est Non African American: 82 mL/min (ref >=60)
Glucose, Bld: 121 mg/dL — ABNORMAL HIGH (ref 70–99)
Potassium: 3.6 mmol/L (ref 3.5–5.3)
Sodium: 126 mmol/L — ABNORMAL LOW (ref 135–146)
Total Bilirubin: 0.8 mg/dL (ref 0.2–1.2)
Total Protein: 6.8 g/dL (ref 6.1–8.1)

## 2017-02-22 LAB — URIC ACID: Uric Acid, Serum: 5.7 mg/dL (ref 4.0–8.0)

## 2017-02-22 MED ORDER — AZITHROMYCIN 250 MG PO TABS: ORAL_TABLET | ORAL | 0 refills | Status: DC

## 2017-02-22 MED ORDER — LISINOPRIL 20 MG PO TABS: 20.0000 mg | ORAL_TABLET | Freq: Every day | ORAL | 3 refills | Status: DC

## 2017-02-22 NOTE — Progress Notes (Signed)
Patient ID: Francisco [name] MRN: [id number], DOB: [birth year], 71 y.o. Date of Encounter: @DATE @  Chief Complaint:  Chief Complaint  Patient presents with  . Hypertension    f/u  . chest congestion    2wks  . Cough    HPI: 71 y.o. year old white male  presents for routine follow-up.   I reviewed his extensive medical history.  At his LOV with me, which was OV to F/U after recent Hospitalization---I asked about his current alcohol intake. His response that was, "I drink right much last night----last night I went a little overboard--with the Super Bowl" Asked if he was watching the game by himself or with others. Says that he was watching it alone. Is that he drank about 12 or 13 beers. Says that he was drinking no other additional types of alcohol.  Says that most days is just 5 or 6 beers. Says "I could quit any time if I needed to". Asked if getting a pacemaker and having seizures would be an indication to quit. His responses that he "likes the taste of beer and he probably won't quit until he dies."  Today he reports he has had no recent gout flare since his last visit here. Is that he does take medicine daily to help prevent this.  He is taking blood pressure medications as directed. Having no lightheadedness or lower extremity edema.  Taking cholesterol medication as directed. No myalgias or other adverse effects.  States that he has been having head congestion and chest congestion for 2 weeks now. Has had no significant sore throat. No fevers or chills.  No other complaints or concerns to address today.   Past Medical History:  Diagnosis Date  . Anemia   . Celiac disease   . GERD (gastroesophageal reflux disease)   . Heart murmur   . Hypertension   . Multiple duodenal ulcers   . Shortness of breath   . Symptomatic Bradycardia    a. 07/2015 s/p MDT Advisa L DC PPM (Ser #: [id number]).  . 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 allopurinol (ZYLOPRIM) 300 MG tablet TAKE 1 TABLET(300 MG) BY MOUTH DAILY 90 tablet 5  . amLODipine (NORVASC) 5 MG tablet TAKE 1 TABLET BY MOUTH DAILY 90 tablet 0  . clotrimazole-betamethasone (LOTRISONE) cream APPLY EXTERNALLY TO THE AFFECTED AREA TWICE DAILY 45 g 0  . colchicine 0.6 MG tablet Take 1 tablet (0.6 mg total) by mouth 2 (two) times daily. Take twice daily for 2 weeks, then use as needed for gout flare up (Patient taking differently: Take 0.6 mg by mouth 2 (two) times daily as needed (gout flare). ) 60 tablet 0  . ferrous sulfate 325 (65 FE) MG tablet Take 1 tablet (325 mg total) by mouth 2 (two) times daily with a meal. (Patient taking differently: Take 325 mg by mouth daily. ) 60 tablet 0  . furosemide (LASIX) 20 MG tablet TAKE 1 TABLET(20 MG) BY MOUTH DAILY 90 tablet 0  . levETIRAcetam (KEPPRA XR) 500 MG 24 hr tablet Take 3 tablets daily. 180 tablet 3  . lisinopril-hydrochlorothiazide (PRINZIDE,ZESTORETIC) 20-25 MG tablet TAKE 1 TABLET BY MOUTH DAILY 90 tablet 0  . metoprolol (LOPRESSOR) 50 MG tablet Take 1 tablet (50 mg total) by mouth 2 (two) times daily. 180 tablet 3  . naproxen sodium (ALEVE) 220 MG tablet Take 220 mg by mouth  2 (two) times daily as needed (pain/ headache).    . pantoprazole (PROTONIX) 40 MG tablet TAKE 1 TABLET BY MOUTH TWICE DAILY BEFORE A MEAL 180 tablet 0  . potassium chloride (K-DUR) 10 MEQ tablet TAKE 1 TABLET(10 MEQ) BY MOUTH DAILY 90 tablet 0  . tamsulosin (FLOMAX) 0.4 MG CAPS capsule TAKE ONE CAPSULE BY MOUTH DAILY 90 capsule 0  . levETIRAcetam (KEPPRA XR) 500 MG 24 hr tablet Take 3 tablets daily. 270 tablet 3   No facility-administered medications prior to visit.     Allergies:  Allergies  Allergen Reactions  . Gluten Meal Other (See Comments)    Celiac Disease - pt states on 08/14/16 that he has been eating gluten products for about 3 months with no reaction     Social History   Social History  . Marital status: Single    Spouse name: N/A  . Number of children: N/A  . Years of education: N/A   Occupational History  . Not on file.   Social History Main Topics  . Smoking status: Former Smoker    Packs/day: 0.50    Years: 50.00    Types: Cigarettes  . Smokeless tobacco: Former Systems developer  . Alcohol use 0.0 oz/week    1 - 2 Glasses of wine per week     Comment: ocassional  . Drug use: No  . Sexual activity: No   Other Topics Concern  . Not on file   Social History Narrative   Lives alone. --as of 11/2013   Smokes e-cig. Has nicotine in it. No real cigarette any more    Family History  Problem Relation Age of Onset  . Hodgkin's lymphoma Father      Review of Systems:  See HPI for pertinent ROS. All other ROS negative.    Physical Exam: Blood pressure 110/60, pulse 64, temperature 98.1 F (36.7 C), temperature source Oral, resp. rate 18, weight 209 lb 3.2 oz (94.9 kg), SpO2 97 %., Body mass index is 30.89 kg/m. General: WNWD WM. Appears in no acute distress. Neck: Supple. No thyromegaly. No lymphadenopathy. Bilateral carotid bruits versus radiation of murmur to the neck. Lungs: Clear bilaterally to auscultation without wheezes, rales, or rhonchi. Breathing is unlabored. Heart: Regular rhythm. IV/ VI murmur. Abdomen: Soft, non-tender, non-distended with normoactive bowel sounds. No hepatomegaly. No rebound/guarding. No obvious abdominal masses. Musculoskeletal:  Strength and tone normal for age. Extremities/Skin: Warm and dry.  No edema. Neuro: Alert and oriented X 3. Moves all extremities spontaneously. Gait is normal. CNII-XII grossly in tact. Psych:  Responds to questions appropriately with a normal affect.     ASSESSMENT AND PLAN:  71 y.o. year old male with  1. Essential hypertension Blood Pressure controlled/ at goal. Check lab to monitor. D/C Lisinopril HCT and change to plain lisinopril---given gout and given that  he is also on lasix - COMPLETE METABOLIC PANEL WITH GFR  2. BPH (benign prostatic hypertrophy) Stable/controlled.  - COMPLETE METABOLIC PANEL WITH GFR  3. Hyperglycemia Check lab to monitor. - COMPLETE METABOLIC PANEL WITH GFR  4. Hyperlipidemia - COMPLETE METABOLIC PANEL WITH GFR --Lipid Panel-----He had coffee with small amount of cream--o/w is fasting  5. Bilateral lower extremity edema (chronic) He Currently has no lower extremity edema on exam and this is controlled with current medications. - COMPLETE METABOLIC PANEL WITH GFR  6. Vitamin D deficiency   7. Alcohol abuse, daily use See HPI. He says that he doesn't plan to quit until he dies then says  that he will cut back to 3 per day. - COMPLETE METABOLIC PANEL WITH GFR  8. H/O Alcohol withdrawal seizure, with unspecified complication ([place]) This is been managed by neurology - COMPLETE METABOLIC PANEL WITH GFR  9. Hypertrophic obstructive cardiomyopathy (HOCM) ([place]) Managed by cardiology - COMPLETE METABOLIC PANEL WITH GFR  10. Complete heart block ([place]) Managed by cardiology. Has pacemaker implanted. - COMPLETE METABOLIC PANEL WITH GFR  11. Cardiac device in situ, other Managed by cardiology - COMPLETE METABOLIC PANEL WITH GFR  12. Celiac disease - COMPLETE METABOLIC PANEL WITH GFR  13. Gastrointestinal hemorrhage associated with duodenal ulcer He had anemia--secondary to  GI bleed in the past. Will check CBC to monitor her anemia. - COMPLETE METABOLIC PANEL WITH GFR - CBC with Differential/Platelet  14. Multiple duodenal ulcers He had anemia--secondary to  GI bleed in the past. Will check CBC to monitor her anemia.  - COMPLETE METABOLIC PANEL WITH GFR - CBC with Differential/Platelet  15. Alcoholism /alcohol abuse ([place]) See HPI - COMPLETE METABOLIC PANEL WITH GFR  16. Gout, unspecified cause, unspecified chronicity, unspecified site He is now on Allopurinol.  Recheck Uric Acid Level and BMET Will  stop HCTZ as this may worsen gout--also, he is on both HCTZ and Lasix---Will change lisinopril hct to plain lisinopril   Check labs and will follow-up with him when I get these results. Routine follow-up visit 6 months or sooner if needed.  Signed, [address] [place], [place], [facility] 02/22/2017 9:27 AM

## 2017-02-23 ENCOUNTER — Other Ambulatory Visit: Payer: Self-pay

## 2017-02-23 MED ORDER — POTASSIUM CHLORIDE ER 10 MEQ PO TBCR: EXTENDED_RELEASE_TABLET | ORAL | 1 refills | Status: DC

## 2017-02-23 MED ORDER — AMLODIPINE BESYLATE 5 MG PO TABS: 5.0000 mg | ORAL_TABLET | Freq: Every day | ORAL | 1 refills | Status: DC

## 2017-02-23 MED ORDER — TAMSULOSIN HCL 0.4 MG PO CAPS: 0.4000 mg | ORAL_CAPSULE | Freq: Every day | ORAL | 1 refills | Status: DC

## 2017-03-07 ENCOUNTER — Encounter: Payer: Self-pay | Admitting: Physician Assistant

## 2017-03-15 ENCOUNTER — Telehealth: Payer: Self-pay

## 2017-03-15 NOTE — Telephone Encounter (Signed)
Patient called and stated he received a letter stating he had an outstanding balance. Patient states his insurance should have covered the bill. Pls call to discuss

## 2017-03-22 ENCOUNTER — Other Ambulatory Visit: Payer: Self-pay | Admitting: Physician Assistant

## 2017-04-24 ENCOUNTER — Telehealth: Payer: Self-pay | Admitting: Cardiology

## 2017-04-24 ENCOUNTER — Ambulatory Visit (INDEPENDENT_AMBULATORY_CARE_PROVIDER_SITE_OTHER): Payer: Medicare HMO | Admitting: *Deleted

## 2017-04-24 DIAGNOSIS — I442 Atrioventricular block, complete: Secondary | ICD-10-CM | POA: Diagnosis not present

## 2017-04-24 NOTE — Telephone Encounter (Signed)
Spoke with pt and reminded pt of remote transmission that is due today. Pt verbalized understanding.   

## 2017-04-26 LAB — CUP PACEART REMOTE DEVICE CHECK
Battery Impedance: 134 Ohm
Battery Remaining Longevity: 143 mo
Battery Voltage: 2.79 V
Brady Statistic AP VP Percent: 10 %
Brady Statistic AP VS Percent: 0 %
Brady Statistic AS VP Percent: 90 %
Brady Statistic AS VS Percent: 0 %
Date Time Interrogation Session: 20180619175421
Implantable Lead Implant Date: 20160902
Implantable Lead Implant Date: 20160902
Implantable Lead Location: 753859
Implantable Lead Location: 753860
Implantable Lead Model: 5076
Implantable Lead Model: 5076
Implantable Pulse Generator Implant Date: 20160902
Lead Channel Impedance Value: 521 Ohm
Lead Channel Impedance Value: 525 Ohm
Lead Channel Pacing Threshold Amplitude: 0.5 V
Lead Channel Pacing Threshold Amplitude: 1 V
Lead Channel Pacing Threshold Pulse Width: 0.4 ms
Lead Channel Pacing Threshold Pulse Width: 0.4 ms
Lead Channel Setting Pacing Amplitude: 1.5 V
Lead Channel Setting Pacing Amplitude: 2 V
Lead Channel Setting Pacing Pulse Width: 0.4 ms
Lead Channel Setting Sensing Sensitivity: 4 mV

## 2017-04-26 NOTE — Progress Notes (Signed)
Remote pacemaker transmission.   

## 2017-04-27 ENCOUNTER — Other Ambulatory Visit: Payer: Self-pay | Admitting: Neurology

## 2017-04-27 ENCOUNTER — Encounter: Payer: Self-pay | Admitting: Cardiology

## 2017-04-27 DIAGNOSIS — R569 Unspecified convulsions: Secondary | ICD-10-CM

## 2017-06-21 ENCOUNTER — Other Ambulatory Visit: Payer: Self-pay | Admitting: Family Medicine

## 2017-06-27 ENCOUNTER — Encounter: Payer: Self-pay | Admitting: Physician Assistant

## 2017-06-29 ENCOUNTER — Other Ambulatory Visit: Payer: Self-pay | Admitting: Neurology

## 2017-06-29 DIAGNOSIS — R569 Unspecified convulsions: Secondary | ICD-10-CM

## 2017-07-24 ENCOUNTER — Ambulatory Visit (INDEPENDENT_AMBULATORY_CARE_PROVIDER_SITE_OTHER): Payer: Medicare HMO | Admitting: *Deleted

## 2017-07-24 DIAGNOSIS — I442 Atrioventricular block, complete: Secondary | ICD-10-CM | POA: Diagnosis not present

## 2017-07-24 NOTE — Progress Notes (Signed)
Remote pacemaker transmission.   

## 2017-07-25 LAB — CUP PACEART REMOTE DEVICE CHECK
Battery Impedance: 134 Ohm
Battery Remaining Longevity: 141 mo
Battery Voltage: 2.79 V
Brady Statistic AP VP Percent: 13 %
Brady Statistic AP VS Percent: 0 %
Brady Statistic AS VP Percent: 87 %
Brady Statistic AS VS Percent: 0 %
Date Time Interrogation Session: 20180918123143
Implantable Lead Implant Date: 20160902
Implantable Lead Implant Date: 20160902
Implantable Lead Location: 753859
Implantable Lead Location: 753860
Implantable Lead Model: 5076
Implantable Lead Model: 5076
Implantable Pulse Generator Implant Date: 20160902
Lead Channel Impedance Value: 481 Ohm
Lead Channel Impedance Value: 484 Ohm
Lead Channel Pacing Threshold Amplitude: 0.5 V
Lead Channel Pacing Threshold Amplitude: 0.875 V
Lead Channel Pacing Threshold Pulse Width: 0.4 ms
Lead Channel Pacing Threshold Pulse Width: 0.4 ms
Lead Channel Setting Pacing Amplitude: 1.5 V
Lead Channel Setting Pacing Amplitude: 2 V
Lead Channel Setting Pacing Pulse Width: 0.4 ms
Lead Channel Setting Sensing Sensitivity: 4 mV

## 2017-07-26 ENCOUNTER — Encounter: Payer: Self-pay | Admitting: Cardiology

## 2017-07-26 ENCOUNTER — Telehealth: Payer: Self-pay | Admitting: *Deleted

## 2017-07-26 NOTE — Telephone Encounter (Signed)
Spoke to patient about Dr.Camnitz's recommendations. Patient states that he is currently taking Lopressor 28m BID. Updated patient medication list. Last VHR episode was from 03/25/17. Patient was told to continue his current medications. Patient verbalized understanding.  [facility] if anything further is recommended.

## 2017-07-26 NOTE — Telephone Encounter (Signed)
-----   Message from Will Meredith Leeds, MD sent at 07/25/2017  3:25 PM EDT ----- Abnormal device interrogation reviewed.  Lead parameters and battery status stable.  VHR episodes. Start metoprolol 25 mg BID

## 2017-07-27 MED ORDER — METOPROLOL TARTRATE 50 MG PO TABS
50.0000 mg | ORAL_TABLET | Freq: Two times a day (BID) | ORAL | 6 refills | Status: DC
Start: 2017-07-27 — End: 2018-02-05

## 2017-07-27 NOTE — Telephone Encounter (Signed)
Since he is already on it, lets increase to 50 mg BID.

## 2017-07-27 NOTE — Telephone Encounter (Signed)
Forward to rn continuation from previous messages

## 2017-07-27 NOTE — Telephone Encounter (Signed)
°  New Message   pt verbalized that he is calling for rn   About the medication request

## 2017-07-27 NOTE — Telephone Encounter (Signed)
Returned call to patient, he stated he has been taking Metoprolol 50 mg twice a day.I will send message to [facility] to let him know.

## 2017-07-30 NOTE — Telephone Encounter (Addendum)
Advised patient to increase Metoprolol to 100 mg  BID, per Dr. Curt Bears.  Pt will call office if symptoms, such as dizziness or low BP, begins. Will call patient at the end of the week to see how he is doing on increased dosage. Patient verbalized understanding and agreeable to plan.

## 2017-08-01 ENCOUNTER — Other Ambulatory Visit: Payer: Self-pay | Admitting: Physician Assistant

## 2017-08-03 NOTE — Telephone Encounter (Signed)
Pt tells me he is doing ok on increased dosage. Advised to call the office if problems begin. Patient verbalized understanding and agreeable to plan.

## 2017-08-10 ENCOUNTER — Ambulatory Visit: Payer: Commercial Managed Care - HMO | Admitting: Neurology

## 2017-08-11 DIAGNOSIS — R531 Weakness: Secondary | ICD-10-CM | POA: Diagnosis not present

## 2017-08-29 ENCOUNTER — Other Ambulatory Visit: Payer: Self-pay | Admitting: Physician Assistant

## 2017-09-26 ENCOUNTER — Other Ambulatory Visit: Payer: Self-pay | Admitting: Physician Assistant

## 2017-10-04 ENCOUNTER — Encounter: Payer: Self-pay | Admitting: Physician Assistant

## 2017-10-04 ENCOUNTER — Other Ambulatory Visit: Payer: Self-pay

## 2017-10-04 ENCOUNTER — Ambulatory Visit (INDEPENDENT_AMBULATORY_CARE_PROVIDER_SITE_OTHER): Payer: Medicare HMO | Admitting: Physician Assistant

## 2017-10-04 VITALS — BP 120/62 | HR 83 | Temp 97.6°F | Resp 14 | Ht 69.0 in | Wt 198.8 lb

## 2017-10-04 DIAGNOSIS — E871 Hypo-osmolality and hyponatremia: Secondary | ICD-10-CM | POA: Diagnosis not present

## 2017-10-04 DIAGNOSIS — I442 Atrioventricular block, complete: Secondary | ICD-10-CM

## 2017-10-04 DIAGNOSIS — F1010 Alcohol abuse, uncomplicated: Secondary | ICD-10-CM | POA: Diagnosis not present

## 2017-10-04 DIAGNOSIS — K219 Gastro-esophageal reflux disease without esophagitis: Secondary | ICD-10-CM | POA: Diagnosis not present

## 2017-10-04 DIAGNOSIS — I5032 Chronic diastolic (congestive) heart failure: Secondary | ICD-10-CM

## 2017-10-04 DIAGNOSIS — R600 Localized edema: Secondary | ICD-10-CM | POA: Diagnosis not present

## 2017-10-04 DIAGNOSIS — I421 Obstructive hypertrophic cardiomyopathy: Secondary | ICD-10-CM | POA: Diagnosis not present

## 2017-10-04 DIAGNOSIS — K900 Celiac disease: Secondary | ICD-10-CM

## 2017-10-04 DIAGNOSIS — R221 Localized swelling, mass and lump, neck: Secondary | ICD-10-CM

## 2017-10-04 DIAGNOSIS — I10 Essential (primary) hypertension: Secondary | ICD-10-CM | POA: Diagnosis not present

## 2017-10-04 DIAGNOSIS — M1000 Idiopathic gout, unspecified site: Secondary | ICD-10-CM

## 2017-10-04 DIAGNOSIS — E785 Hyperlipidemia, unspecified: Secondary | ICD-10-CM

## 2017-10-04 DIAGNOSIS — I5033 Acute on chronic diastolic (congestive) heart failure: Secondary | ICD-10-CM

## 2017-10-04 NOTE — Progress Notes (Signed)
Patient ID: Francisco [name] MRN: [id number], DOB: July 17, 1946, 71 y.o. Date of Encounter: @DATE @  Chief Complaint:  Chief Complaint  Patient presents with  . knot behind left ear    HPI: 71 y.o. year old white male  presents for routine follow-up.   I reviewed his extensive medical history.  At prior OV with me, which was OV to F/U after recent Hospitalization---I asked about his current alcohol intake. His response that was, "I drank right much last night----last night I went a little overboard--with the Super Bowl" Asked if he was watching the game by himself or with others. He said that he was watching it alone. Said that he drank about 12 or 13 beers. Michela Pitcher that he was drinking no other additional types of alcohol.  Said that most days is just 5 or 6 beers. Said "I could quit any time if I needed to". Asked if getting a pacemaker and having seizures would be an indication to quit. His response was that he "likes the taste of beer and he probably won't quit until he dies."  Today he reports that his alcohol intake is about the same. I left it at that.   Today he reports he has had no recent gout flare since his last visit here. Says that he does take medicine daily to help prevent this.  He is taking blood pressure medications as directed. Having no lightheadedness or lower extremity edema.Says he "is not having any swelling in his legs now."  Taking cholesterol medication as directed. No myalgias or other adverse effects.   Reports that he has noticed this "mass" at left neck for about 3 or 4 weeks.  Reports that it is not painful at all.  Says that if he did not feel of the area he would not even know it was there. Has no other specific concerns to address today.  Everything has felt stable and the same other than noticing this mass on the left neck.    Past Medical History:  Diagnosis Date  . Anemia   . Celiac disease   . GERD (gastroesophageal reflux disease)   .  Heart murmur   . Hypertension   . Multiple duodenal ulcers   . Shortness of breath   . Symptomatic Bradycardia    a. 07/2015 s/p MDT Advisa L DC PPM (Ser #: [id number]).  . 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 allopurinol (ZYLOPRIM) 300 MG tablet TAKE 1 TABLET(300 MG) BY MOUTH DAILY 90 tablet 0  . azithromycin (ZITHROMAX) 250 MG tablet Day 1: Take 2 daily. Days 2 -5: Take 1 daily. 6 tablet 0  . clotrimazole-betamethasone (LOTRISONE) cream APPLY EXTERNALLY TO THE AFFECTED AREA TWICE DAILY 45 g 0  . colchicine 0.6 MG tablet Take 1 tablet (0.6 mg total) by mouth 2 (two) times daily. Take twice daily for 2 weeks, then use as needed for gout flare up (Patient taking differently: Take 0.6 mg by mouth 2 (two) times daily as needed (gout flare). ) 60 tablet 0  . ferrous sulfate 325 (65 FE) MG tablet Take 1 tablet (325 mg total) by mouth 2 (two) times daily with a meal. (Patient taking differently: Take 325 mg by mouth daily. ) 60 tablet 0  . furosemide (LASIX) 20 MG tablet TAKE 1 TABLET(20 MG) BY MOUTH DAILY 90 tablet 0  . levETIRAcetam (KEPPRA XR) 500 MG 24  hr tablet TAKE 3 TABLETS BY MOUTH DAILY 270 tablet 1  . lisinopril (PRINIVIL,ZESTRIL) 20 MG tablet Take 1 tablet (20 mg total) by mouth daily. 90 tablet 3  . metoprolol tartrate (LOPRESSOR) 50 MG tablet Take 1 tablet (50 mg total) by mouth 2 (two) times daily. 60 tablet 6  . naproxen sodium (ALEVE) 220 MG tablet Take 220 mg by mouth 2 (two) times daily as needed (pain/ headache).    . pantoprazole (PROTONIX) 40 MG tablet TAKE 1 TABLET BY MOUTH TWICE DAILY BEFORE A MEAL 180 tablet 0  . potassium chloride (K-DUR) 10 MEQ tablet TAKE 1 TABLET(10 MEQ) BY MOUTH DAILY 90 tablet 0  . tamsulosin (FLOMAX) 0.4 MG CAPS capsule TAKE 1 CAPSULE(0.4 MG) BY MOUTH DAILY 90 capsule 0  . amLODipine (NORVASC) 5 MG tablet TAKE 1 TABLET(5 MG) BY MOUTH  DAILY 90 tablet 0   No facility-administered medications prior to visit.     Allergies:  Allergies  Allergen Reactions  . Gluten Meal Other (See Comments)    Celiac Disease - pt states on 08/14/16 that he has been eating gluten products for about 3 months with no reaction    Social History   Socioeconomic History  . Marital status: Single    Spouse name: Not on file  . Number of children: Not on file  . Years of education: Not on file  . Highest education level: Not on file  Social Needs  . Financial resource strain: Not on file  . Food insecurity - worry: Not on file  . Food insecurity - inability: Not on file  . Transportation needs - medical: Not on file  . Transportation needs - non-medical: Not on file  Occupational History  . Not on file  Tobacco Use  . Smoking status: Former Smoker    Packs/day: 0.50    Years: 50.00    Pack years: 25.00    Types: Cigarettes  . Smokeless tobacco: Former Network engineer and Sexual Activity  . Alcohol use: Yes    Alcohol/week: 0.0 oz    Types: 1 - 2 Glasses of wine per week    Comment: ocassional  . Drug use: No  . Sexual activity: No  Other Topics Concern  . Not on file  Social History Narrative   Lives alone. --as of 11/2013   Smokes e-cig. Has nicotine in it. No real cigarette any more    Family History  Problem Relation Age of Onset  . Hodgkin's lymphoma Father      Review of Systems:  See HPI for pertinent ROS. All other ROS negative.    Physical Exam: Blood pressure 120/62, pulse 83, temperature 97.6 F (36.4 C), temperature source Oral, resp. rate 14, height 5' 9"  (1.753 m), weight 90.2 kg (198 lb 12.8 oz), SpO2 98 %., Body mass index is 29.36 kg/m. General: WNWD WM. Appears in no acute distress. HEENT: Left ear appears normal.  Ear canal and tympanic membrane appear normal.  Oral mucosa and pharynx appear normal. Neck: At area just posterior to angle of jaw--at area of parotid gland--there is area of firmness  that measures ~ 2cm diameter. There is no erythema. No tenderness.  Lungs: Clear bilaterally to auscultation without wheezes, rales, or rhonchi. Breathing is unlabored. Heart: Regular rhythm. IV/ VI murmur. Abdomen: Soft, non-tender, non-distended with normoactive bowel sounds. No hepatomegaly. No rebound/guarding. No obvious abdominal masses. Musculoskeletal:  Strength and tone normal for age. Extremities/Skin: Warm and dry.  No edema. Neuro: Alert and oriented  X 3. Moves all extremities spontaneously. Gait is normal. CNII-XII grossly in tact. Psych:  Responds to questions appropriately with a normal affect.     ASSESSMENT AND PLAN:  71 y.o. year old male with   Essential hypertension Blood Pressure controlled/ at goal. Check lab to monitor. - COMPLETE METABOLIC PANEL WITH GFR  BPH (benign prostatic hypertrophy) Stable/controlled.  - COMPLETE METABOLIC PANEL WITH GFR  Hyperglycemia Check lab to monitor. - COMPLETE METABOLIC PANEL WITH GFR  Hyperlipidemia He has required no lipid medication.  Lipid panel 12/18 was good with LDL 72.  Can wait to recheck this in future.  Bilateral lower extremity edema (chronic) He Currently has no lower extremity edema on exam and this is controlled with current medications.Check lab to monitor. - COMPLETE METABOLIC PANEL WITH GFR   Alcohol abuse, daily use See HPI. He says that he doesn't plan to quit until he dies then says that he will cut back to 3 per day. - COMPLETE METABOLIC PANEL WITH GFR  H/O Alcohol withdrawal seizure, with unspecified complication ([place]) This has been managed by neurology - COMPLETE METABOLIC PANEL WITH GFR  Hypertrophic obstructive cardiomyopathy (HOCM) ([place]) Managed by cardiology - COMPLETE METABOLIC PANEL WITH GFR  Complete heart block ([place]) Managed by cardiology. Has pacemaker implanted. - COMPLETE METABOLIC PANEL WITH GFR  Cardiac device in situ, other Managed by cardiology - COMPLETE METABOLIC PANEL  WITH GFR  Celiac disease Controlled with gluten-free diet.  Obtain Ultrasound Neck.  Obtain CBC and routine labs to monitor routine meds.   I will follow-up with him when I get these results.   Marin Olp Cornelius, [place], [facility] 10/04/2017 3:14 PM

## 2017-10-05 LAB — CBC WITH DIFFERENTIAL/PLATELET
Basophils Absolute: 24 cells/uL (ref 0–200)
Basophils Relative: 0.3 %
Eosinophils Absolute: 166 cells/uL (ref 15–500)
Eosinophils Relative: 2.1 %
HCT: 39.3 % (ref 38.5–50.0)
Hemoglobin: 14.1 g/dL (ref 13.2–17.1)
Lymphs Abs: 1138 cells/uL (ref 850–3900)
MCH: 32.9 pg (ref 27.0–33.0)
MCHC: 35.9 g/dL (ref 32.0–36.0)
MCV: 91.8 fL (ref 80.0–100.0)
MPV: 11.4 fL (ref 7.5–12.5)
Monocytes Relative: 7.6 %
Neutro Abs: 5972 cells/uL (ref 1500–7800)
Neutrophils Relative %: 75.6 %
Platelets: 273 10*3/uL (ref 140–400)
RBC: 4.28 10*6/uL (ref 4.20–5.80)
RDW: 14.2 % (ref 11.0–15.0)
Total Lymphocyte: 14.4 %
WBC mixed population: 600 cells/uL (ref 200–950)
WBC: 7.9 10*3/uL (ref 3.8–10.8)

## 2017-10-05 LAB — COMPLETE METABOLIC PANEL WITH GFR
AG Ratio: 1.3 (calc) (ref 1.0–2.5)
ALT: 20 U/L (ref 9–46)
AST: 50 U/L — ABNORMAL HIGH (ref 10–35)
Albumin: 4.1 g/dL (ref 3.6–5.1)
Alkaline phosphatase (APISO): 67 U/L (ref 40–115)
BUN: 9 mg/dL (ref 7–25)
CO2: 26 mmol/L (ref 20–32)
Calcium: 9.8 mg/dL (ref 8.6–10.3)
Chloride: 91 mmol/L — ABNORMAL LOW (ref 98–110)
Creat: 1 mg/dL (ref 0.70–1.18)
GFR, Est African American: 87 mL/min/{1.73_m2} (ref >=60)
GFR, Est Non African American: 75 mL/min/{1.73_m2} (ref >=60)
Globulin: 3.2 g/dL (calc) (ref 1.9–3.7)
Glucose, Bld: 119 mg/dL — ABNORMAL HIGH (ref 65–99)
Potassium: 5 mmol/L (ref 3.5–5.3)
Sodium: 127 mmol/L — ABNORMAL LOW (ref 135–146)
Total Bilirubin: 0.5 mg/dL (ref 0.2–1.2)
Total Protein: 7.3 g/dL (ref 6.1–8.1)

## 2017-10-08 ENCOUNTER — Ambulatory Visit
Admission: RE | Admit: 2017-10-08 | Discharge: 2017-10-08 | Disposition: A | Discharge status: Home / Self Care | Payer: Medicare HMO | Source: Ambulatory Visit | Attending: Physician Assistant | Admitting: Physician Assistant

## 2017-10-08 DIAGNOSIS — R221 Localized swelling, mass and lump, neck: Secondary | ICD-10-CM

## 2017-10-09 ENCOUNTER — Other Ambulatory Visit: Payer: Self-pay

## 2017-10-09 DIAGNOSIS — K118 Other diseases of salivary glands: Secondary | ICD-10-CM

## 2017-10-23 ENCOUNTER — Ambulatory Visit (INDEPENDENT_AMBULATORY_CARE_PROVIDER_SITE_OTHER): Payer: Medicare HMO | Admitting: *Deleted

## 2017-10-23 DIAGNOSIS — Z720 Tobacco use: Secondary | ICD-10-CM | POA: Diagnosis not present

## 2017-10-23 DIAGNOSIS — F1010 Alcohol abuse, uncomplicated: Secondary | ICD-10-CM | POA: Diagnosis not present

## 2017-10-23 DIAGNOSIS — I442 Atrioventricular block, complete: Secondary | ICD-10-CM

## 2017-10-23 DIAGNOSIS — F17210 Nicotine dependence, cigarettes, uncomplicated: Secondary | ICD-10-CM | POA: Diagnosis not present

## 2017-10-23 DIAGNOSIS — K118 Other diseases of salivary glands: Secondary | ICD-10-CM | POA: Diagnosis not present

## 2017-10-23 DIAGNOSIS — D37030 Neoplasm of uncertain behavior of the parotid salivary glands: Secondary | ICD-10-CM | POA: Diagnosis not present

## 2017-10-23 NOTE — Progress Notes (Signed)
Remote pacemaker transmission.   

## 2017-10-24 ENCOUNTER — Encounter: Payer: Self-pay | Admitting: Cardiology

## 2017-10-26 LAB — CUP PACEART REMOTE DEVICE CHECK
Battery Impedance: 158 Ohm
Battery Remaining Longevity: 134 mo
Battery Voltage: 2.79 V
Brady Statistic AP VP Percent: 16 %
Brady Statistic AP VS Percent: 0 %
Brady Statistic AS VP Percent: 84 %
Brady Statistic AS VS Percent: 0 %
Date Time Interrogation Session: 20181218135803
Implantable Lead Implant Date: 20160902
Implantable Lead Implant Date: 20160902
Implantable Lead Location: 753859
Implantable Lead Location: 753860
Implantable Lead Model: 5076
Implantable Lead Model: 5076
Implantable Pulse Generator Implant Date: 20160902
Lead Channel Impedance Value: 472 Ohm
Lead Channel Impedance Value: 546 Ohm
Lead Channel Pacing Threshold Amplitude: 0.875 V
Lead Channel Pacing Threshold Amplitude: 1 V
Lead Channel Pacing Threshold Pulse Width: 0.4 ms
Lead Channel Pacing Threshold Pulse Width: 0.4 ms
Lead Channel Setting Pacing Amplitude: 1.75 V
Lead Channel Setting Pacing Amplitude: 2 V
Lead Channel Setting Pacing Pulse Width: 0.4 ms
Lead Channel Setting Sensing Sensitivity: 4 mV

## 2017-10-29 ENCOUNTER — Other Ambulatory Visit: Payer: Self-pay | Admitting: Otolaryngology

## 2017-10-29 DIAGNOSIS — K118 Other diseases of salivary glands: Secondary | ICD-10-CM

## 2017-10-29 DIAGNOSIS — Z720 Tobacco use: Secondary | ICD-10-CM

## 2017-11-12 ENCOUNTER — Other Ambulatory Visit: Payer: Medicare HMO

## 2017-11-15 ENCOUNTER — Ambulatory Visit
Admission: RE | Admit: 2017-11-15 | Discharge: 2017-11-15 | Disposition: A | Discharge status: Home / Self Care | Payer: Medicare HMO | Source: Ambulatory Visit | Attending: Otolaryngology | Admitting: Otolaryngology

## 2017-11-15 DIAGNOSIS — Z720 Tobacco use: Secondary | ICD-10-CM

## 2017-11-15 DIAGNOSIS — K118 Other diseases of salivary glands: Secondary | ICD-10-CM

## 2017-11-15 DIAGNOSIS — D110 Benign neoplasm of parotid gland: Secondary | ICD-10-CM | POA: Diagnosis not present

## 2017-11-15 MED ORDER — IOPAMIDOL (ISOVUE-300) INJECTION 61%
75.0000 mL | Freq: Once | INTRAVENOUS | Status: AC | PRN
  Administered 2017-11-15: 75 mL via INTRAVENOUS

## 2017-11-16 ENCOUNTER — Other Ambulatory Visit: Payer: Self-pay

## 2017-11-16 DIAGNOSIS — I6529 Occlusion and stenosis of unspecified carotid artery: Secondary | ICD-10-CM

## 2017-11-19 ENCOUNTER — Telehealth: Payer: Self-pay

## 2017-11-19 DIAGNOSIS — R531 Weakness: Secondary | ICD-10-CM

## 2017-11-19 NOTE — Telephone Encounter (Signed)
Patient states he called [facility] and they told him we had to put in a referral. Patient understands insurance may or may not pay

## 2017-11-19 NOTE — Telephone Encounter (Signed)
He will have to look into this himself.  Generally, this kind of thing is not paid for by insurance.

## 2017-11-19 NOTE — Telephone Encounter (Signed)
Call placed to patient he is aware that he would have to call and request this himself.

## 2017-11-19 NOTE — Telephone Encounter (Signed)
Patient called requesting a referral be put in for homehealth care because he is not able to do anything around the home he can not stand long enough to cook and would like help around the home.Ok to put in referral?

## 2017-11-19 NOTE — Telephone Encounter (Signed)
Referral has been placed. Patient aware

## 2017-11-19 NOTE — Telephone Encounter (Signed)
Referral is approved to [facility].

## 2017-11-23 ENCOUNTER — Telehealth: Payer: Self-pay | Admitting: *Deleted

## 2017-11-23 ENCOUNTER — Ambulatory Visit (HOSPITAL_COMMUNITY)
Admission: RE | Admit: 2017-11-23 | Discharge: 2017-11-23 | Disposition: A | Discharge status: Home / Self Care | Payer: Medicare HMO | Source: Ambulatory Visit | Attending: Physician Assistant | Admitting: Physician Assistant

## 2017-11-23 DIAGNOSIS — I6521 Occlusion and stenosis of right carotid artery: Secondary | ICD-10-CM | POA: Diagnosis not present

## 2017-11-23 DIAGNOSIS — I6529 Occlusion and stenosis of unspecified carotid artery: Secondary | ICD-10-CM

## 2017-11-23 MED ORDER — ATORVASTATIN CALCIUM 20 MG PO TABS: 20.0000 mg | ORAL_TABLET | Freq: Every day | ORAL | 3 refills | Status: AC

## 2017-11-23 NOTE — Progress Notes (Signed)
Carotid artery duplex has been completed. 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  11/23/17 11:45 AM Carlos Levering RVT

## 2017-11-23 NOTE — Telephone Encounter (Signed)
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 Recommend lipitor 16m more to help left side  Also recommend vascular referral to discuss with him his carotid disease and what can be done in the future

## 2017-11-23 NOTE — Telephone Encounter (Signed)
Received call from [facility] in regards to Carotid Duplex.   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   MD to be made aware.

## 2017-11-23 NOTE — Telephone Encounter (Signed)
Call placed to patient and patient made aware.   Agreeable to medication and referral.   Prescription sent to pharmacy.   Referral orders placed.

## 2017-11-30 ENCOUNTER — Encounter (HOSPITAL_COMMUNITY): Payer: Self-pay | Admitting: *Deleted

## 2017-11-30 ENCOUNTER — Emergency Department (HOSPITAL_COMMUNITY): Payer: Medicare HMO

## 2017-11-30 ENCOUNTER — Inpatient Hospital Stay (HOSPITAL_COMMUNITY)
Admission: EM | Admit: 2017-11-30 | Discharge: 2017-12-05 | DRG: 641 | Disposition: A | Discharge status: Skilled Nursing Facility | Payer: Medicare HMO | Attending: Internal Medicine | Admitting: Internal Medicine

## 2017-11-30 DIAGNOSIS — R350 Frequency of micturition: Secondary | ICD-10-CM | POA: Diagnosis present

## 2017-11-30 DIAGNOSIS — R404 Transient alteration of awareness: Secondary | ICD-10-CM | POA: Diagnosis not present

## 2017-11-30 DIAGNOSIS — T07XXXA Unspecified multiple injuries, initial encounter: Secondary | ICD-10-CM | POA: Diagnosis not present

## 2017-11-30 DIAGNOSIS — F1010 Alcohol abuse, uncomplicated: Secondary | ICD-10-CM | POA: Diagnosis not present

## 2017-11-30 DIAGNOSIS — K900 Celiac disease: Secondary | ICD-10-CM | POA: Diagnosis present

## 2017-11-30 DIAGNOSIS — E876 Hypokalemia: Secondary | ICD-10-CM | POA: Diagnosis present

## 2017-11-30 DIAGNOSIS — E785 Hyperlipidemia, unspecified: Secondary | ICD-10-CM | POA: Diagnosis present

## 2017-11-30 DIAGNOSIS — W1809XA Striking against other object with subsequent fall, initial encounter: Secondary | ICD-10-CM | POA: Diagnosis present

## 2017-11-30 DIAGNOSIS — S301XXA Contusion of abdominal wall, initial encounter: Secondary | ICD-10-CM | POA: Diagnosis not present

## 2017-11-30 DIAGNOSIS — E871 Hypo-osmolality and hyponatremia: Secondary | ICD-10-CM | POA: Diagnosis not present

## 2017-11-30 DIAGNOSIS — S199XXA Unspecified injury of neck, initial encounter: Secondary | ICD-10-CM | POA: Diagnosis not present

## 2017-11-30 DIAGNOSIS — R531 Weakness: Secondary | ICD-10-CM

## 2017-11-30 DIAGNOSIS — R5381 Other malaise: Secondary | ICD-10-CM | POA: Diagnosis present

## 2017-11-30 DIAGNOSIS — W57XXXA Bitten or stung by nonvenomous insect and other nonvenomous arthropods, initial encounter: Secondary | ICD-10-CM

## 2017-11-30 DIAGNOSIS — S0990XA Unspecified injury of head, initial encounter: Secondary | ICD-10-CM

## 2017-11-30 DIAGNOSIS — Z950 Presence of cardiac pacemaker: Secondary | ICD-10-CM

## 2017-11-30 DIAGNOSIS — F1011 Alcohol abuse, in remission: Secondary | ICD-10-CM

## 2017-11-30 DIAGNOSIS — S3991XA Unspecified injury of abdomen, initial encounter: Secondary | ICD-10-CM

## 2017-11-30 DIAGNOSIS — I110 Hypertensive heart disease with heart failure: Secondary | ICD-10-CM | POA: Diagnosis present

## 2017-11-30 DIAGNOSIS — S80211A Abrasion, right knee, initial encounter: Secondary | ICD-10-CM | POA: Diagnosis present

## 2017-11-30 DIAGNOSIS — S80212A Abrasion, left knee, initial encounter: Secondary | ICD-10-CM | POA: Diagnosis present

## 2017-11-30 DIAGNOSIS — R296 Repeated falls: Secondary | ICD-10-CM | POA: Diagnosis present

## 2017-11-30 DIAGNOSIS — I442 Atrioventricular block, complete: Secondary | ICD-10-CM | POA: Diagnosis present

## 2017-11-30 DIAGNOSIS — M25561 Pain in right knee: Secondary | ICD-10-CM

## 2017-11-30 DIAGNOSIS — I10 Essential (primary) hypertension: Secondary | ICD-10-CM | POA: Diagnosis present

## 2017-11-30 DIAGNOSIS — S3993XA Unspecified injury of pelvis, initial encounter: Secondary | ICD-10-CM | POA: Diagnosis not present

## 2017-11-30 DIAGNOSIS — I421 Obstructive hypertrophic cardiomyopathy: Secondary | ICD-10-CM | POA: Diagnosis present

## 2017-11-30 DIAGNOSIS — Z8711 Personal history of peptic ulcer disease: Secondary | ICD-10-CM

## 2017-11-30 DIAGNOSIS — F1020 Alcohol dependence, uncomplicated: Secondary | ICD-10-CM | POA: Diagnosis present

## 2017-11-30 DIAGNOSIS — R569 Unspecified convulsions: Secondary | ICD-10-CM

## 2017-11-30 DIAGNOSIS — M109 Gout, unspecified: Secondary | ICD-10-CM | POA: Diagnosis present

## 2017-11-30 DIAGNOSIS — W19XXXA Unspecified fall, initial encounter: Secondary | ICD-10-CM

## 2017-11-30 DIAGNOSIS — S299XXA Unspecified injury of thorax, initial encounter: Secondary | ICD-10-CM | POA: Diagnosis not present

## 2017-11-30 DIAGNOSIS — S8991XA Unspecified injury of right lower leg, initial encounter: Secondary | ICD-10-CM | POA: Diagnosis not present

## 2017-11-30 DIAGNOSIS — S20211A Contusion of right front wall of thorax, initial encounter: Secondary | ICD-10-CM | POA: Diagnosis not present

## 2017-11-30 DIAGNOSIS — Z87891 Personal history of nicotine dependence: Secondary | ICD-10-CM

## 2017-11-30 DIAGNOSIS — R51 Headache: Secondary | ICD-10-CM | POA: Diagnosis not present

## 2017-11-30 DIAGNOSIS — R9431 Abnormal electrocardiogram [ECG] [EKG]: Secondary | ICD-10-CM | POA: Diagnosis not present

## 2017-11-30 DIAGNOSIS — Y92009 Unspecified place in unspecified non-institutional (private) residence as the place of occurrence of the external cause: Secondary | ICD-10-CM

## 2017-11-30 DIAGNOSIS — K219 Gastro-esophageal reflux disease without esophagitis: Secondary | ICD-10-CM | POA: Diagnosis present

## 2017-11-30 DIAGNOSIS — Z9181 History of falling: Secondary | ICD-10-CM

## 2017-11-30 DIAGNOSIS — I5032 Chronic diastolic (congestive) heart failure: Secondary | ICD-10-CM | POA: Diagnosis present

## 2017-11-30 LAB — BASIC METABOLIC PANEL
Anion gap: 14 (ref 5–15)
BUN: 11 mg/dL (ref 6–20)
CO2: 27 mmol/L (ref 22–32)
Calcium: 8.8 mg/dL — ABNORMAL LOW (ref 8.9–10.3)
Chloride: 78 mmol/L — ABNORMAL LOW (ref 101–111)
Creatinine, Ser: 1.03 mg/dL (ref 0.61–1.24)
GFR calc Af Amer: >60 mL/min (ref >60)
GFR calc non Af Amer: >60 mL/min (ref >60)
Glucose, Bld: 96 mg/dL (ref 65–99)
Potassium: 2.9 mmol/L — ABNORMAL LOW (ref 3.5–5.1)
Sodium: 119 mmol/L — CL (ref 135–145)

## 2017-11-30 LAB — URINALYSIS, ROUTINE W REFLEX MICROSCOPIC
Bacteria, UA: NONE SEEN
Bilirubin Urine: NEGATIVE
Glucose, UA: NEGATIVE mg/dL
Hgb urine dipstick: NEGATIVE
Ketones, ur: 5 mg/dL — AB
Nitrite: NEGATIVE
Protein, ur: NEGATIVE mg/dL
Specific Gravity, Urine: 1.005 (ref 1.005–1.030)
pH: 6 (ref 5.0–8.0)

## 2017-11-30 LAB — COMPREHENSIVE METABOLIC PANEL
ALT: 30 U/L (ref 17–63)
AST: 70 U/L — ABNORMAL HIGH (ref 15–41)
Albumin: 3.7 g/dL (ref 3.5–5.0)
Alkaline Phosphatase: 71 U/L (ref 38–126)
Anion gap: 14 (ref 5–15)
BUN: 13 mg/dL (ref 6–20)
CO2: 26 mmol/L (ref 22–32)
Calcium: 8.6 mg/dL — ABNORMAL LOW (ref 8.9–10.3)
Chloride: 77 mmol/L — ABNORMAL LOW (ref 101–111)
Creatinine, Ser: 1.13 mg/dL (ref 0.61–1.24)
GFR calc Af Amer: >60 mL/min (ref >60)
GFR calc non Af Amer: >60 mL/min (ref >60)
Glucose, Bld: 102 mg/dL — ABNORMAL HIGH (ref 65–99)
Potassium: 3.3 mmol/L — ABNORMAL LOW (ref 3.5–5.1)
Sodium: 117 mmol/L — CL (ref 135–145)
Total Bilirubin: 1.2 mg/dL (ref 0.3–1.2)
Total Protein: 7.1 g/dL (ref 6.5–8.1)

## 2017-11-30 LAB — CBC
HCT: 36.3 % — ABNORMAL LOW (ref 39.0–52.0)
Hemoglobin: 13.7 g/dL (ref 13.0–17.0)
MCH: 32.7 pg (ref 26.0–34.0)
MCHC: 37.7 g/dL — ABNORMAL HIGH (ref 30.0–36.0)
MCV: 86.6 fL (ref 78.0–100.0)
Platelets: 213 10*3/uL (ref 150–400)
RBC: 4.19 MIL/uL — ABNORMAL LOW (ref 4.22–5.81)
RDW: 13.2 % (ref 11.5–15.5)
WBC: 8.4 10*3/uL (ref 4.0–10.5)

## 2017-11-30 LAB — ETHANOL: Alcohol, Ethyl (B): <10 mg/dL (ref <10)

## 2017-11-30 LAB — RAPID URINE DRUG SCREEN, HOSP PERFORMED
Amphetamines: NOT DETECTED
Barbiturates: NOT DETECTED
Benzodiazepines: NOT DETECTED
Cocaine: NOT DETECTED
Opiates: NOT DETECTED
Tetrahydrocannabinol: NOT DETECTED

## 2017-11-30 LAB — PROTIME-INR
INR: 0.92
Prothrombin Time: 12.3 seconds (ref 11.4–15.2)

## 2017-11-30 LAB — TROPONIN I: Troponin I: <0.03 ng/mL (ref <0.03)

## 2017-11-30 LAB — TSH: TSH: 1.372 u[IU]/mL (ref 0.350–4.500)

## 2017-11-30 LAB — URIC ACID: Uric Acid, Serum: 5.8 mg/dL (ref 4.4–7.6)

## 2017-11-30 MED ORDER — FOLIC ACID 1 MG PO TABS
1.0000 mg | ORAL_TABLET | Freq: Every day | ORAL | Status: DC
  Administered 2017-12-01 – 2017-12-05 (×6): 1 mg via ORAL
  Filled 2017-11-30 (×6): qty 1

## 2017-11-30 MED ORDER — ONDANSETRON HCL 4 MG PO TABS
4.0000 mg | ORAL_TABLET | Freq: Four times a day (QID) | ORAL | Status: DC | PRN
Start: 2017-11-30 — End: 2017-12-05

## 2017-11-30 MED ORDER — VITAMIN B-1 100 MG PO TABS
100.0000 mg | ORAL_TABLET | Freq: Every day | ORAL | Status: DC
  Administered 2017-12-01 – 2017-12-05 (×6): 100 mg via ORAL
  Filled 2017-11-30 (×6): qty 1

## 2017-11-30 MED ORDER — ATORVASTATIN CALCIUM 10 MG PO TABS
20.0000 mg | ORAL_TABLET | Freq: Every day | ORAL | Status: DC
  Administered 2017-12-01 – 2017-12-04 (×4): 20 mg via ORAL
  Filled 2017-11-30: qty 2
  Filled 2017-11-30: qty 1
  Filled 2017-11-30 (×3): qty 2
  Filled 2017-11-30: qty 1
  Filled 2017-11-30: qty 2

## 2017-11-30 MED ORDER — LORAZEPAM 1 MG PO TABS: 0.0000 mg | ORAL_TABLET | Freq: Two times a day (BID) | ORAL | Status: DC

## 2017-11-30 MED ORDER — LORAZEPAM 1 MG PO TABS: 1.0000 mg | ORAL_TABLET | Freq: Four times a day (QID) | ORAL | Status: AC | PRN

## 2017-11-30 MED ORDER — ACETAMINOPHEN 325 MG PO TABS
650.0000 mg | ORAL_TABLET | Freq: Four times a day (QID) | ORAL | Status: DC | PRN
Start: 2017-11-30 — End: 2017-12-05

## 2017-11-30 MED ORDER — AMLODIPINE BESYLATE 5 MG PO TABS: 5.0000 mg | ORAL_TABLET | Freq: Every day | ORAL | Status: DC

## 2017-11-30 MED ORDER — ALLOPURINOL 300 MG PO TABS
300.0000 mg | ORAL_TABLET | Freq: Every day | ORAL | Status: DC
  Administered 2017-12-01 – 2017-12-05 (×5): 300 mg via ORAL
  Filled 2017-11-30 (×2): qty 1
  Filled 2017-11-30: qty 3
  Filled 2017-11-30 (×3): qty 1
  Filled 2017-11-30: qty 3

## 2017-11-30 MED ORDER — ADULT MULTIVITAMIN W/MINERALS CH
1.0000 | ORAL_TABLET | Freq: Every day | ORAL | Status: DC
  Administered 2017-12-01 – 2017-12-05 (×6): 1 via ORAL
  Filled 2017-11-30 (×6): qty 1

## 2017-11-30 MED ORDER — POTASSIUM CHLORIDE ER 10 MEQ PO TBCR
10.0000 meq | EXTENDED_RELEASE_TABLET | Freq: Every day | ORAL | Status: DC
  Filled 2017-11-30: qty 1

## 2017-11-30 MED ORDER — IOPAMIDOL (ISOVUE-300) INJECTION 61%
INTRAVENOUS | Status: AC
  Filled 2017-11-30: qty 100

## 2017-11-30 MED ORDER — LORAZEPAM 1 MG PO TABS: 0.0000 mg | ORAL_TABLET | Freq: Four times a day (QID) | ORAL | Status: AC

## 2017-11-30 MED ORDER — FERROUS SULFATE 325 (65 FE) MG PO TABS
325.0000 mg | ORAL_TABLET | Freq: Every day | ORAL | Status: DC
  Administered 2017-12-01 – 2017-12-05 (×5): 325 mg via ORAL
  Filled 2017-11-30 (×5): qty 1

## 2017-11-30 MED ORDER — LORAZEPAM 1 MG PO TABS: 0.0000 mg | ORAL_TABLET | Freq: Two times a day (BID) | ORAL | Status: AC

## 2017-11-30 MED ORDER — LORAZEPAM 2 MG/ML IJ SOLN: 0.0000 mg | Freq: Two times a day (BID) | INTRAMUSCULAR | Status: DC

## 2017-11-30 MED ORDER — THIAMINE HCL 100 MG/ML IJ SOLN: 100.0000 mg | Freq: Every day | INTRAMUSCULAR | Status: DC

## 2017-11-30 MED ORDER — LORAZEPAM 1 MG PO TABS: 0.0000 mg | ORAL_TABLET | Freq: Four times a day (QID) | ORAL | Status: DC

## 2017-11-30 MED ORDER — FUROSEMIDE 40 MG PO TABS: 20.0000 mg | ORAL_TABLET | Freq: Every day | ORAL | Status: DC

## 2017-11-30 MED ORDER — LORAZEPAM 2 MG/ML IJ SOLN: 0.0000 mg | Freq: Four times a day (QID) | INTRAMUSCULAR | Status: DC

## 2017-11-30 MED ORDER — VITAMIN B-1 100 MG PO TABS
100.0000 mg | ORAL_TABLET | Freq: Every day | ORAL | Status: DC
  Administered 2017-11-30: 100 mg via ORAL
  Filled 2017-11-30: qty 1

## 2017-11-30 MED ORDER — POTASSIUM CHLORIDE CRYS ER 20 MEQ PO TBCR
60.0000 meq | EXTENDED_RELEASE_TABLET | Freq: Once | ORAL | Status: AC
  Administered 2017-12-01: 60 meq via ORAL
  Filled 2017-11-30: qty 3

## 2017-11-30 MED ORDER — LORAZEPAM 2 MG/ML IJ SOLN: 1.0000 mg | Freq: Four times a day (QID) | INTRAMUSCULAR | Status: AC | PRN

## 2017-11-30 MED ORDER — TAMSULOSIN HCL 0.4 MG PO CAPS
0.4000 mg | ORAL_CAPSULE | Freq: Every day | ORAL | Status: DC
  Administered 2017-12-01 – 2017-12-04 (×5): 0.4 mg via ORAL
  Filled 2017-11-30 (×5): qty 1

## 2017-11-30 MED ORDER — PANTOPRAZOLE SODIUM 40 MG PO TBEC
40.0000 mg | DELAYED_RELEASE_TABLET | Freq: Two times a day (BID) | ORAL | Status: DC
  Administered 2017-12-01 – 2017-12-05 (×10): 40 mg via ORAL
  Filled 2017-11-30 (×10): qty 1

## 2017-11-30 MED ORDER — ONDANSETRON HCL 4 MG/2ML IJ SOLN: 4.0000 mg | Freq: Four times a day (QID) | INTRAMUSCULAR | Status: DC | PRN

## 2017-11-30 MED ORDER — ACETAMINOPHEN 650 MG RE SUPP: 650.0000 mg | Freq: Four times a day (QID) | RECTAL | Status: DC | PRN

## 2017-11-30 MED ORDER — LISINOPRIL 20 MG PO TABS: 20.0000 mg | ORAL_TABLET | Freq: Every day | ORAL | Status: DC

## 2017-11-30 MED ORDER — IOPAMIDOL (ISOVUE-300) INJECTION 61%
100.0000 mL | Freq: Once | INTRAVENOUS | Status: AC | PRN
  Administered 2017-11-30: 100 mL via INTRAVENOUS

## 2017-11-30 MED ORDER — METOPROLOL TARTRATE 50 MG PO TABS
50.0000 mg | ORAL_TABLET | Freq: Two times a day (BID) | ORAL | Status: DC
  Administered 2017-12-01 – 2017-12-05 (×10): 50 mg via ORAL
  Filled 2017-11-30: qty 1
  Filled 2017-11-30: qty 2
  Filled 2017-11-30 (×2): qty 1
  Filled 2017-11-30 (×2): qty 2
  Filled 2017-11-30: qty 1
  Filled 2017-11-30: qty 2
  Filled 2017-11-30 (×2): qty 1

## 2017-11-30 MED ORDER — LEVETIRACETAM ER 500 MG PO TB24
1500.0000 mg | ORAL_TABLET | Freq: Every day | ORAL | Status: DC
  Administered 2017-12-01 – 2017-12-05 (×5): 1500 mg via ORAL
  Filled 2017-11-30 (×5): qty 3

## 2017-11-30 NOTE — ED Triage Notes (Signed)
Per EMS, pt was told by social services that he needs to seen at the ED for a fall last night. EMS also states the pt was told he needed to come to the ED in order for social services to place him into a facility.

## 2017-11-30 NOTE — Progress Notes (Addendum)
CSW received a call from ED secretary stating [facility] would like to speak to this writer regarding the pt.  CSW awaiting call from [facility].  [facility] ED CSW Theadora Rama from [facility] who states they have a referral in process to place the pt in long-term care and that if pt is admitted to please call [phone] to assist [facility] in that referral process before pt D/C's please.  [facility] also asked that if pt is D/C'd back home to please notify them at [phone].  Please reconsult if future social work needs arise.  CSW signing off, as social work intervention is no longer needed.  Alphonse Guild. Loralee Weitzman, LCSW, LCAS, CSI Clinical Social Worker Ph: [phone]

## 2017-11-30 NOTE — ED Notes (Signed)
All pt clothes and belongings placed in red biohazard bag.

## 2017-11-30 NOTE — ED Notes (Signed)
ED TO INPATIENT HANDOFF REPORT  Name/Age/Gender Francisco [name] 72 y.o. male  Code Status    Code Status Orders  (From admission, onward)        Start     Ordered   11/30/17 2136  Full code  Continuous     11/30/17 2136    Code Status History    Date Active Date Inactive Code Status Order ID Comments User Context   11/30/2017 15:34 11/30/2017 21:36 Full Code [id number]  [facility]   08/14/2016 14:48 08/15/2016 21:10 Full Code [id number]  [facility]   07/09/2015 10:42 07/13/2015 19:34 Full Code [id number]  Constance Haw, MD Inpatient   06/29/2015 14:15 07/09/2015 10:42 Full Code [id number]  Samella Parr, NP ED   07/02/2012 17:22 07/04/2012 13:12 Full Code [id number]  [place], Noralee Space, RN Inpatient      Home/SNF/Other Home  Chief Complaint Wellness Check  Level of Care/Admitting Diagnosis ED Disposition    ED Disposition Condition [facility] Area: [facility] [[id number]]  Level of Care: Stepdown [14]  Admit to SDU based on following criteria: Severe physiological/psychological symptoms:  Any diagnosis requiring assessment & intervention at least every 4 hours on an ongoing basis to obtain desired patient outcomes including stability and rehabilitation  Diagnosis: Hyponatremia [683419]  Admitting Physician: Rise Patience [phone]  Attending Physician: Rise Patience [phone]  PT Class (Do Not Modify): Observation [104]  PT Acc Code (Do Not Modify): Observation [10022]       Medical History Past Medical History:  Diagnosis Date  . Anemia   . Celiac disease   . GERD (gastroesophageal reflux disease)   . Heart murmur   . Hypertension   . Multiple duodenal ulcers   . Shortness of breath   . Symptomatic Bradycardia    a. 07/2015 s/p MDT Advisa L DC PPM (Ser #: [id number]).  . 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 Gluten  Meal Other (See Comments)    Celiac Disease - pt states on 08/14/16 that he has been eating gluten products for about 3 months with no reaction    IV Location/Drains/Wounds Patient Lines/Drains/Airways Status   Active Line/Drains/Airways    Name:   Placement date:   Placement time:   Site:   Days:   Peripheral IV 11/30/17 Left Forearm   11/30/17    1904    Forearm   less than 1   Incision (Closed) 07/10/15 Chest Left;Upper   07/10/15    1500     874          Labs/Imaging Results for orders placed or performed during the hospital encounter of 11/30/17 (from the past 48 hour(s))  CBC     Status: Abnormal   Collection Time: 11/30/17  3:41 PM  Result Value Ref Range   WBC 8.4 4.0 - 10.5 K/uL   RBC 4.19 (L) 4.22 - 5.81 MIL/uL   Hemoglobin 13.7 13.0 - 17.0 g/dL   HCT 36.3 (L) 39.0 - 52.0 %   MCV 86.6 78.0 - 100.0 fL   MCH 32.7 26.0 - 34.0 pg   MCHC 37.7 (H) 30.0 - 36.0 g/dL    Comment: RULED OUT INTERFERING SUBSTANCES   RDW 13.2 11.5 - 15.5 %   Platelets 213 150 - 400 K/uL  Comprehensive metabolic panel     Status: Abnormal   Collection Time: 11/30/17  3:41 PM  Result Value Ref Range   Sodium 117 (LL) 135 - 145 mmol/L    Comment: CRITICAL RESULT CALLED TO, READ BACK BY AND VERIFIED WITH: WEST,S. RN @1631  ON 01.25.19 BY COHEN,K    Potassium 3.3 (L) 3.5 - 5.1 mmol/L   Chloride 77 (L) 101 - 111 mmol/L   CO2 26 22 - 32 mmol/L   Glucose, Bld 102 (H) 65 - 99 mg/dL   BUN 13 6 - 20 mg/dL   Creatinine, Ser 1.13 0.61 - 1.24 mg/dL   Calcium 8.6 (L) 8.9 - 10.3 mg/dL   Total Protein 7.1 6.5 - 8.1 g/dL   Albumin 3.7 3.5 - 5.0 g/dL   AST 70 (H) 15 - 41 U/L   ALT 30 17 - 63 U/L   Alkaline Phosphatase 71 38 - 126 U/L   Total Bilirubin 1.2 0.3 - 1.2 mg/dL   GFR calc non Af Amer >60 >60 mL/min   GFR calc Af Amer >60 >60 mL/min    Comment: (NOTE) The eGFR has been calculated using the CKD EPI equation. This calculation has not been validated in all clinical situations. eGFR's persistently  <60 mL/min signify possible Chronic Kidney Disease.    Anion gap 14 5 - 15  Troponin I     Status: None   Collection Time: 11/30/17  3:41 PM  Result Value Ref Range   Troponin I <0.03 <0.03 ng/mL  Ethanol     Status: None   Collection Time: 11/30/17  3:41 PM  Result Value Ref Range   Alcohol, Ethyl (B) <10 <10 mg/dL    Comment:        LOWEST DETECTABLE LIMIT FOR SERUM ALCOHOL IS 10 mg/dL FOR MEDICAL PURPOSES ONLY   Protime-INR     Status: None   Collection Time: 11/30/17  3:41 PM  Result Value Ref Range   Prothrombin Time 12.3 11.4 - 15.2 seconds   INR 0.92   TSH     Status: None   Collection Time: 11/30/17  3:41 PM  Result Value Ref Range   TSH 1.372 0.350 - 4.500 uIU/mL    Comment: Performed by a 3rd Generation assay with a functional sensitivity of <=0.01 uIU/mL.  Urinalysis, Routine w reflex microscopic     Status: Abnormal   Collection Time: 11/30/17  3:58 PM  Result Value Ref Range   Color, Urine YELLOW YELLOW   APPearance CLEAR CLEAR   Specific Gravity, Urine 1.005 1.005 - 1.030   pH 6.0 5.0 - 8.0   Glucose, UA NEGATIVE NEGATIVE mg/dL   Hgb urine dipstick NEGATIVE NEGATIVE   Bilirubin Urine NEGATIVE NEGATIVE   Ketones, ur 5 (A) NEGATIVE mg/dL   Protein, ur NEGATIVE NEGATIVE mg/dL   Nitrite NEGATIVE NEGATIVE   Leukocytes, UA TRACE (A) NEGATIVE   RBC / HPF 0-5 0 - 5 RBC/hpf   WBC, UA 0-5 0 - 5 WBC/hpf   Bacteria, UA NONE SEEN NONE SEEN   Squamous Epithelial / LPF 0-5 (A) NONE SEEN   Hyaline Casts, UA PRESENT   Rapid urine drug screen (hospital performed)     Status: None   Collection Time: 11/30/17  3:58 PM  Result Value Ref Range   Opiates NONE DETECTED NONE DETECTED   Cocaine NONE DETECTED NONE DETECTED   Benzodiazepines NONE DETECTED NONE DETECTED   Amphetamines NONE DETECTED NONE DETECTED   Tetrahydrocannabinol NONE DETECTED NONE DETECTED   Barbiturates NONE DETECTED NONE DETECTED    Comment: (NOTE) DRUG SCREEN FOR MEDICAL PURPOSES  ONLY.  IF  CONFIRMATION IS NEEDED FOR ANY PURPOSE, NOTIFY LAB WITHIN 5 DAYS. LOWEST DETECTABLE LIMITS FOR URINE DRUG SCREEN Drug Class                     Cutoff (ng/mL) Amphetamine and metabolites    1000 Barbiturate and metabolites    200 Benzodiazepine                 967 Tricyclics and metabolites     300 Opiates and metabolites        300 Cocaine and metabolites        300 THC                            50   Basic metabolic panel     Status: Abnormal   Collection Time: 11/30/17  9:59 PM  Result Value Ref Range   Sodium 119 (LL) 135 - 145 mmol/L    Comment: CRITICAL RESULT CALLED TO, READ BACK BY AND VERIFIED WITH: Dolphus Jenny @2313  11/30/17 MKELLY    Potassium 2.9 (L) 3.5 - 5.1 mmol/L   Chloride 78 (L) 101 - 111 mmol/L   CO2 27 22 - 32 mmol/L   Glucose, Bld 96 65 - 99 mg/dL   BUN 11 6 - 20 mg/dL   Creatinine, Ser 1.03 0.61 - 1.24 mg/dL   Calcium 8.8 (L) 8.9 - 10.3 mg/dL   GFR calc non Af Amer >60 >60 mL/min   GFR calc Af Amer >60 >60 mL/min    Comment: (NOTE) The eGFR has been calculated using the CKD EPI equation. This calculation has not been validated in all clinical situations. eGFR's persistently <60 mL/min signify possible Chronic Kidney Disease.    Anion gap 14 5 - 15  Uric acid     Status: None   Collection Time: 11/30/17  9:59 PM  Result Value Ref Range   Uric Acid, Serum 5.8 4.4 - 7.6 mg/dL   Ct Head Wo Contrast  Result Date: 11/30/2017 CLINICAL DATA:  Patient fell yesterday.  Posttraumatic headache. EXAM: CT HEAD WITHOUT CONTRAST CT CERVICAL SPINE WITHOUT CONTRAST TECHNIQUE: Multidetector CT imaging of the head and cervical spine was performed following the standard protocol without intravenous contrast. Multiplanar CT image reconstructions of the cervical spine were also generated. COMPARISON:  08/15/2016 head CT and 11/15/2017 neck CT FINDINGS: CT HEAD FINDINGS Brain: Chronic stable involutional changes of the brain consistent with atrophy with chronic small  vessel ischemia. No large vascular territory infarction, hemorrhage or midline shift. No hydrocephalus. No intra-axial mass nor extra-axial fluid collections. Vascular: No hyperdense vessels. Moderate atherosclerosis of the carotid siphons. Skull: No skull fracture. Sinuses/Orbits: No acute abnormality.  Intact orbits and globes. Other: No significant soft tissue swelling. CT CERVICAL SPINE FINDINGS Alignment: Intact craniocervical relationship and atlantodental interval. Minimal grade 1 anterolisthesis C3 on C4 likely on the basis of degenerative disc and facet arthropathy. Skull base and vertebrae: No acute fracture. No primary bone lesion or focal pathologic process. Soft tissues and spinal canal: No prevertebral fluid or swelling. No visible canal hematoma. Disc levels: Prominent anterior osteophytes C4 through C6 with small posterior marginal osteophytes. No significant central canal stenosis. Minimal bilateral neural foraminal encroachment from uncinate spurring bilaterally at C2-3, C4-5, C5-6 and C6-7. Upper chest: Not included Other: Redemonstration of solid masslike abnormalities in the left parotid as described on recent neck CT from 11/15/2017. The largest is approximately 11 mm and  may reflect a Warthin's gland tumor is initially stated or possibly pleomorphic adenoma or parotid lymph nodes. IMPRESSION: 1. Atrophy with chronic small vessel ischemia. No acute intracranial abnormality or skull fracture. 2. Cervical spondylosis without acute cervical spine fracture. 3. Redemonstration of solid nodules in the left parotid gland as seen on recent CT neck, the largest approximately 11 mm on current study. Electronically Signed   By: Ashley Royalty M.D.   On: 11/30/2017 19:50   Ct Chest W Contrast  Result Date: 11/30/2017 CLINICAL DATA:  Trauma, fall EXAM: CT CHEST, ABDOMEN, AND PELVIS WITH CONTRAST TECHNIQUE: Multidetector CT imaging of the chest, abdomen and pelvis was performed following the standard  protocol during bolus administration of intravenous contrast. CONTRAST:  165m ISOVUE-300 IOPAMIDOL (ISOVUE-300) INJECTION 61% COMPARISON:  Radiograph 08/14/2016 FINDINGS: CT CHEST FINDINGS Cardiovascular: Moderate aortic atherosclerosis. Dense calcification at the origin of the great vessels. Ectatic ascending aorta, measuring up to 3.9 cm. Coronary artery calcification. Partially visualized cardiac pacing leads. Dense mitral annulus calcification. Normal heart size. No pericardial effusion. Mediastinum/Nodes: No mediastinal hematoma. Midline trachea. No thyroid mass. No significant adenopathy. Esophagus within normal limits except for small distal esophageal hernia Lungs/Pleura: Mild apical emphysema. No pneumothorax. No consolidation. Musculoskeletal: Degenerative changes of the spine. No acute osseous abnormality. Old right third, fourth, fifth rib fractures and old left third, fourth, fifth, and sixth anterior rib fractures. CT ABDOMEN PELVIS FINDINGS Hepatobiliary: No hepatic injury or perihepatic hematoma. Gallbladder is unremarkable Pancreas: Unremarkable. No pancreatic ductal dilatation or surrounding inflammatory changes. Spleen: No splenic injury or perisplenic hematoma. Adrenals/Urinary Tract: No adrenal hemorrhage or renal injury identified. Bladder is unremarkable. Moderate perinephric fat stranding Stomach/Bowel: Stomach is within normal limits. Appendix appears normal. No evidence of bowel wall thickening, distention, or inflammatory changes. Vascular/Lymphatic: Moderate aortic atherosclerosis. No aneurysmal dilatation. Heavy calcification at the origin of the celiac and SMA vessels. Densely calcified renal artery origins. No significant adenopathy. Reproductive: Prostate is unremarkable. Other: Negative for free air or free fluid. Musculoskeletal: No acute or suspicious bone lesion. Degenerative changes of the spine. IMPRESSION: 1. No CT evidence for acute thoracic abnormality. No CT evidence for  acute solid organ injury within the abdomen or pelvis, negative for free air or free fluid. 2. Ectatic ascending aorta.  Moderate aortic atherosclerosis. 3. Mild apical emphysema 4. Old bilateral rib fractures Electronically Signed   By: KDonavan FoilM.D.   On: 11/30/2017 19:57   Ct Cervical Spine Wo Contrast  Result Date: 11/30/2017 CLINICAL DATA:  Patient fell yesterday.  Posttraumatic headache. EXAM: CT HEAD WITHOUT CONTRAST CT CERVICAL SPINE WITHOUT CONTRAST TECHNIQUE: Multidetector CT imaging of the head and cervical spine was performed following the standard protocol without intravenous contrast. Multiplanar CT image reconstructions of the cervical spine were also generated. COMPARISON:  08/15/2016 head CT and 11/15/2017 neck CT FINDINGS: CT HEAD FINDINGS Brain: Chronic stable involutional changes of the brain consistent with atrophy with chronic small vessel ischemia. No large vascular territory infarction, hemorrhage or midline shift. No hydrocephalus. No intra-axial mass nor extra-axial fluid collections. Vascular: No hyperdense vessels. Moderate atherosclerosis of the carotid siphons. Skull: No skull fracture. Sinuses/Orbits: No acute abnormality.  Intact orbits and globes. Other: No significant soft tissue swelling. CT CERVICAL SPINE FINDINGS Alignment: Intact craniocervical relationship and atlantodental interval. Minimal grade 1 anterolisthesis C3 on C4 likely on the basis of degenerative disc and facet arthropathy. Skull base and vertebrae: No acute fracture. No primary bone lesion or focal pathologic process. Soft tissues and spinal canal: No prevertebral  fluid or swelling. No visible canal hematoma. Disc levels: Prominent anterior osteophytes C4 through C6 with small posterior marginal osteophytes. No significant central canal stenosis. Minimal bilateral neural foraminal encroachment from uncinate spurring bilaterally at C2-3, C4-5, C5-6 and C6-7. Upper chest: Not included Other: Redemonstration  of solid masslike abnormalities in the left parotid as described on recent neck CT from 11/15/2017. The largest is approximately 11 mm and may reflect a Warthin's gland tumor is initially stated or possibly pleomorphic adenoma or parotid lymph nodes. IMPRESSION: 1. Atrophy with chronic small vessel ischemia. No acute intracranial abnormality or skull fracture. 2. Cervical spondylosis without acute cervical spine fracture. 3. Redemonstration of solid nodules in the left parotid gland as seen on recent CT neck, the largest approximately 11 mm on current study. Electronically Signed   By: Ashley Royalty M.D.   On: 11/30/2017 19:50   Ct Abdomen Pelvis W Contrast  Result Date: 11/30/2017 CLINICAL DATA:  Trauma, fall EXAM: CT CHEST, ABDOMEN, AND PELVIS WITH CONTRAST TECHNIQUE: Multidetector CT imaging of the chest, abdomen and pelvis was performed following the standard protocol during bolus administration of intravenous contrast. CONTRAST:  111m ISOVUE-300 IOPAMIDOL (ISOVUE-300) INJECTION 61% COMPARISON:  Radiograph 08/14/2016 FINDINGS: CT CHEST FINDINGS Cardiovascular: Moderate aortic atherosclerosis. Dense calcification at the origin of the great vessels. Ectatic ascending aorta, measuring up to 3.9 cm. Coronary artery calcification. Partially visualized cardiac pacing leads. Dense mitral annulus calcification. Normal heart size. No pericardial effusion. Mediastinum/Nodes: No mediastinal hematoma. Midline trachea. No thyroid mass. No significant adenopathy. Esophagus within normal limits except for small distal esophageal hernia Lungs/Pleura: Mild apical emphysema. No pneumothorax. No consolidation. Musculoskeletal: Degenerative changes of the spine. No acute osseous abnormality. Old right third, fourth, fifth rib fractures and old left third, fourth, fifth, and sixth anterior rib fractures. CT ABDOMEN PELVIS FINDINGS Hepatobiliary: No hepatic injury or perihepatic hematoma. Gallbladder is unremarkable Pancreas:  Unremarkable. No pancreatic ductal dilatation or surrounding inflammatory changes. Spleen: No splenic injury or perisplenic hematoma. Adrenals/Urinary Tract: No adrenal hemorrhage or renal injury identified. Bladder is unremarkable. Moderate perinephric fat stranding Stomach/Bowel: Stomach is within normal limits. Appendix appears normal. No evidence of bowel wall thickening, distention, or inflammatory changes. Vascular/Lymphatic: Moderate aortic atherosclerosis. No aneurysmal dilatation. Heavy calcification at the origin of the celiac and SMA vessels. Densely calcified renal artery origins. No significant adenopathy. Reproductive: Prostate is unremarkable. Other: Negative for free air or free fluid. Musculoskeletal: No acute or suspicious bone lesion. Degenerative changes of the spine. IMPRESSION: 1. No CT evidence for acute thoracic abnormality. No CT evidence for acute solid organ injury within the abdomen or pelvis, negative for free air or free fluid. 2. Ectatic ascending aorta.  Moderate aortic atherosclerosis. 3. Mild apical emphysema 4. Old bilateral rib fractures Electronically Signed   By: KDonavan FoilM.D.   On: 11/30/2017 19:57   Dg Knee Complete 4 Views Right  Result Date: 11/30/2017 CLINICAL DATA:  Status post fall. EXAM: RIGHT KNEE - COMPLETE 4+ VIEW COMPARISON:  07/04/2015 FINDINGS: No fracture. No subluxation or dislocation. Trace spurring in the patellofemoral compartment. No worrisome lytic or sclerotic osseous abnormality. No joint effusion. IMPRESSION: Negative. Electronically Signed   By: EMisty StanleyM.D.   On: 11/30/2017 20:00    Pending Labs Unresulted Labs (From admission, onward)   Start     Ordered   12/01/17 0500  CBC  Tomorrow morning,   R     11/30/17 2136   12/01/17 0500  Hepatic function panel  Tomorrow morning,  R     11/30/17 2136   11/30/17 2127  Osmolality  STAT,   R     11/30/17 2126   11/30/17 2127  Cortisol  STAT,   R     11/30/17 2126   11/30/17 8325   Basic metabolic panel  Now then every 4 hours,   R     11/30/17 2126   11/30/17 2125  Sodium, urine, random  STAT,   R     11/30/17 2124   11/30/17 2125  Osmolality, urine  STAT,   R     11/30/17 2124   11/30/17 1736  Urine culture  STAT,   STAT     11/30/17 1735      Vitals/Pain Today's Vitals   11/30/17 1730 11/30/17 2041 11/30/17 2042 11/30/17 2232  BP: 132/60 104/81 104/81 (!) 150/92  Pulse: 66 76 75 (!) 45  Resp: 12 15  20   Temp:      TempSrc:      SpO2: 99% 96%  94%    Isolation Precautions No active isolations  Medications Medications  iopamidol (ISOVUE-300) 61 % injection (not administered)  allopurinol (ZYLOPRIM) tablet 300 mg (not administered)  amLODipine (NORVASC) tablet 5 mg (not administered)  atorvastatin (LIPITOR) tablet 20 mg (not administered)  ferrous sulfate tablet 325 mg (not administered)  furosemide (LASIX) tablet 20 mg (not administered)  levETIRAcetam (KEPPRA XR) 24 hr tablet 1,500 mg (not administered)  lisinopril (PRINIVIL,ZESTRIL) tablet 20 mg (not administered)  metoprolol tartrate (LOPRESSOR) tablet 50 mg (not administered)  pantoprazole (PROTONIX) EC tablet 40 mg (not administered)  potassium chloride (K-DUR) CR tablet 10 mEq (not administered)  tamsulosin (FLOMAX) capsule 0.4 mg (not administered)  LORazepam (ATIVAN) tablet 1 mg (not administered)    Or  LORazepam (ATIVAN) injection 1 mg (not administered)  thiamine (VITAMIN B-1) tablet 100 mg (not administered)    Or  thiamine (B-1) injection 100 mg (not administered)  folic acid (FOLVITE) tablet 1 mg (not administered)  multivitamin with minerals tablet 1 tablet (not administered)  acetaminophen (TYLENOL) tablet 650 mg (not administered)    Or  acetaminophen (TYLENOL) suppository 650 mg (not administered)  ondansetron (ZOFRAN) tablet 4 mg (not administered)    Or  ondansetron (ZOFRAN) injection 4 mg (not administered)  LORazepam (ATIVAN) tablet 0-4 mg (not administered)     Followed by  LORazepam (ATIVAN) tablet 0-4 mg (not administered)  iopamidol (ISOVUE-300) 61 % injection 100 mL (100 mLs Intravenous Contrast Given 11/30/17 1910)    Mobility walks

## 2017-11-30 NOTE — ED Provider Notes (Signed)
[facility]-ICU/STEPDOWN Provider Note   CSN: [id number] Arrival date & time: 11/30/17  1409     History   Chief Complaint Chief Complaint  Patient presents with  . Fall    HPI Francisco [name] is a 72 y.o. male.  HPI   Patient is a 72 year old male with a h/o ETOH abuse and withdrawal seizure (on Keppra), diastolic HF, HTN, hypertrophic cardiomyopathy, complete heart block s/p pacemaker/defib,  who presents to the ER s/p fall that occurred last night.  Patient states that he was walking in his house when he tripped over his walker and fell forward landing on his knees.  Patient states that he was unable to catch himself with his hands and hit his head.  He denies any loss of consciousness, headache, nausea, vomiting, or vision changes. Denies prodrome of CP, SOB, palpitations, lightheadedness, dizziness prior to fall. Reports multiple abrasions to BUE and BLE, lower back pain, right lateral rib pain, and pain/bruising to the RUQ. Reports generalized weakness to BLE that has been chronic for the last 6 months and unchanged since fall. Denies numbness to BLE, no urinary/bowel incontinence, no urinary retention. Reports urinary frequency, but denies fevers, chest pain, SOB, diarrhea, constipation, recent URI sxs, dysuria, or hematuria.  Pt states that he has had increased frequent falls over the last 6 months due to losing his balance with his walker. He has spoken with his PCP about this. [facility]. Pt stating that he wants to be placed into assisted living because he has had decreased ability to complete ADLs independently and has increasing falls.   Pt has a h/o ETOH abuse and states that he drinks 8 beers per day. His last drink was last night. He had 8 beers yesterday. Review of pt's charge indicates that pt has a h/o withdrawal seizure. Pt denies any drug use.  Patient denies any seizures at home states he has not had a  seizure in years.  Tetanus UTD.   Past Medical History:  Diagnosis Date  . Anemia   . Celiac disease   . GERD (gastroesophageal reflux disease)   . Heart murmur   . Hypertension   . Multiple duodenal ulcers   . Shortness of breath   . Symptomatic Bradycardia    a. 07/2015 s/p MDT Advisa L DC PPM (Ser #: [id number]).  . 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 Chest pain 08/14/2016  . Seizure ([place])   . Gout 12/17/2015  . Convulsions ([place]) 08/17/2015  . Gait instability 08/17/2015  . Alcoholism /alcohol abuse ([place]) 08/17/2015  . Cardiac device in situ, other   . Elevated troponin   . Cardiac arrest ([place])   . Complete heart block ([place])   . Seizures ([place])   . Chronic diastolic congestive heart failure ([place])   . HLD (hyperlipidemia)   . Alcohol withdrawal ([place])   . Acute renal failure syndrome ([place])   . Hypokalemia   . Hypomagnesemia   . Absolute anemia   . Hypertrophic cardiomyopathy ([place])   . Diastolic CHF, acute on chronic (HCC)   . Essential hypertension   . Alcohol withdrawal seizure ([place]) 06/29/2015  . Hypertrophic obstructive cardiomyopathy (HOCM) ([place]) 06/29/2015  . Mild diastolic dysfunction 64/40/3474  . Acute pain of right knee 06/29/2015  . Transaminitis 06/29/2015  . Acute renal failure ([place]) 06/29/2015  . High anion gap metabolic acidosis  06/29/2015  . Acute hypokalemia 06/29/2015  . Alcohol abuse, daily use 06/10/2015  . Hyperglycemia 06/09/2015  . Vitamin D deficiency 06/09/2015  . Hyperlipidemia 06/09/2015  . Bilateral lower extremity edema (chronic) 06/09/2015  . BPH (benign prostatic hypertrophy) 08/13/2014  . Multiple duodenal ulcers 07/03/2012  . Chest pain on exertion 07/02/2012  . GIB (gastrointestinal bleeding) 07/02/2012  . GERD (gastroesophageal reflux disease)   . Heart murmur   . Microcytic anemia 06/14/2012  . Generalized weakness 06/13/2012  . Celiac  disease 06/13/2012  . HTN (hypertension) 06/13/2012  . Acute hyponatremia 06/13/2012    Past Surgical History:  Procedure Laterality Date  . CARDIAC CATHETERIZATION N/A 07/08/2015   Procedure: Left Heart Cath and Coronary Angiography;  Surgeon: Troy Sine, MD;  Location: [facility];  Service: Cardiovascular;  Laterality: N/A;  . CARDIAC CATHETERIZATION N/A 07/08/2015   Procedure: Temporary Pacemaker;  Surgeon: Troy Sine, MD;  Location: [facility];  Service: Cardiovascular;  Laterality: N/A;  . CARDIAC CATHETERIZATION N/A 07/09/2015   Procedure: Temporary Pacemaker;  Surgeon: Troy Sine, MD;  Location: [facility];  Service: Cardiovascular;  Laterality: N/A;  . EP IMPLANTABLE DEVICE N/A 07/09/2015   Procedure: Pacemaker Implant;  Surgeon: Will Meredith Leeds, MD;  Location: [facility];  Service: Cardiovascular;  Laterality: N/A;  . ESOPHAGOGASTRODUODENOSCOPY  07/03/2012   Procedure: ESOPHAGOGASTRODUODENOSCOPY (EGD);  Surgeon: Winfield Cunas., MD;  Location: [facility];  Service: Endoscopy;  Laterality: N/A;  . HEMORRHOID SURGERY    . HERNIA REPAIR    . MOLE REMOVAL         Home Medications    Prior to Admission medications   Medication Sig Start Date End Date Taking? Authorizing Provider  allopurinol (ZYLOPRIM) 300 MG tablet TAKE 1 TABLET(300 MG) BY MOUTH DAILY 09/26/17  Yes Dena Billet B, PA-C  amLODipine (NORVASC) 5 MG tablet Take 5 mg by mouth daily.   Yes [provider]  atorvastatin (LIPITOR) 20 MG tablet Take 1 tablet (20 mg total) by mouth daily. 11/23/17  Yes [place], Modena Nunnery, MD  ferrous sulfate 325 (65 FE) MG tablet Take 1 tablet (325 mg total) by mouth 2 (two) times daily with a meal. Patient taking differently: Take 325 mg by mouth daily.  07/12/15  Yes Velvet Bathe, MD  furosemide (LASIX) 20 MG tablet TAKE 1 TABLET(20 MG) BY MOUTH DAILY 06/21/17  Yes [place], Modena Nunnery, MD  levETIRAcetam (KEPPRA XR) 500 MG 24 hr tablet  TAKE 3 TABLETS BY MOUTH DAILY 06/29/17  Yes Cameron Sprang, MD  lisinopril (PRINIVIL,ZESTRIL) 20 MG tablet Take 1 tablet (20 mg total) by mouth daily. 02/22/17  Yes Dena Billet B, PA-C  metoprolol tartrate (LOPRESSOR) 50 MG tablet Take 1 tablet (50 mg total) by mouth 2 (two) times daily. 07/27/17  Yes [place], Will Hassell Done, MD  naproxen sodium (ALEVE) 220 MG tablet Take 220 mg by mouth 2 (two) times daily as needed (pain/ headache).   Yes [provider]  pantoprazole (PROTONIX) 40 MG tablet TAKE 1 TABLET BY MOUTH TWICE DAILY BEFORE A MEAL 08/29/17  Yes Dena Billet B, PA-C  potassium chloride (K-DUR) 10 MEQ tablet TAKE 1 TABLET(10 MEQ) BY MOUTH DAILY 08/29/17  Yes Dena Billet B, PA-C  tamsulosin (FLOMAX) 0.4 MG CAPS capsule TAKE 1 CAPSULE(0.4 MG) BY MOUTH DAILY 08/29/17  Yes Dena Billet B, PA-C  azithromycin (ZITHROMAX) 250 MG tablet Day 1: Take 2 daily. Days 2 -5: Take 1 daily. Patient  not taking: Reported on 11/30/2017 02/22/17   Dena Billet B, PA-C  clotrimazole-betamethasone (LOTRISONE) cream APPLY EXTERNALLY TO THE AFFECTED AREA TWICE DAILY Patient not taking: Reported on 11/30/2017 03/22/17   Alycia Rossetti, MD  colchicine 0.6 MG tablet Take 1 tablet (0.6 mg total) by mouth 2 (two) times daily. Take twice daily for 2 weeks, then use as needed for gout flare up Patient not taking: Reported on 11/30/2017 12/17/15   Orlena Sheldon, PA-C    Family History Family History  Problem Relation Age of Onset  . Hodgkin's lymphoma Father     Social History Social History   Tobacco Use  . Smoking status: Former Smoker    Packs/day: 0.50    Years: 50.00    Pack years: 25.00    Types: Cigarettes  . Smokeless tobacco: Former Network engineer Use Topics  . Alcohol use: Yes    Alcohol/week: 0.0 oz    Types: 1 - 2 Glasses of wine per week    Comment: ocassional  . Drug use: No     Allergies   Gluten meal   Review of Systems Review of Systems  Constitutional: Negative for chills and  fever.       Frequent falls  HENT: Negative for congestion, ear pain, rhinorrhea and sore throat.   Eyes: Negative for pain and visual disturbance.  Respiratory: Negative for cough, shortness of breath and wheezing.   Cardiovascular: Negative for chest pain, palpitations and leg swelling.  Gastrointestinal: Positive for abdominal pain (RUQ). Negative for constipation, diarrhea, nausea and vomiting.  Genitourinary: Positive for frequency. Negative for dysuria, hematuria and urgency.  Musculoskeletal: Positive for back pain and gait problem. Negative for arthralgias, neck pain and neck stiffness.  Skin: Negative for color change and rash.       Bruising to abdomen, abrasions to BUE and BLE  Neurological: Negative for dizziness, syncope, weakness, light-headedness and headaches.  All other systems reviewed and are negative.    Physical Exam Updated Vital Signs BP (!) 135/40 (BP Location: Left Arm)   Pulse 80   Temp 98 F (36.7 C) (Oral)   Resp 19   Ht 5' 9"  (1.753 m)   Wt 86.7 kg (191 lb 2.2 oz)   SpO2 99%   BMI 28.23 kg/m   Physical Exam  Constitutional: He is oriented to person, place, and time. He appears well-developed and well-nourished. No distress.  HENT:  Head: Normocephalic.  Right Ear: External ear normal.  Left Ear: External ear normal.  Nose: Nose normal.  Mouth/Throat: Oropharynx is clear and moist.  No battle signs, no raccoons eyes, no rhinorrhea. No tenderness to palpation of the skull or face. No deformity or crepitus noted. Small abrasion to bridge of nose.   Eyes: Conjunctivae and EOM are normal. Pupils are equal, round, and reactive to light.  Neck: Normal range of motion. Neck supple.  Full and painless ROM of neck, able to rotate neck laterally to 45 degrees each way with no pain. No c-spine TTP.  Cardiovascular: Normal rate, regular rhythm and intact distal pulses.  Murmur heard. Pulmonary/Chest: Effort normal and breath sounds normal. No stridor. No  respiratory distress. He has no wheezes. He exhibits no tenderness.  Abdominal: Soft. Bowel sounds are normal. He exhibits no distension. There is tenderness (RUQ). There is no guarding.  Musculoskeletal: He exhibits no edema.  No TTP to the cervical, thoracic spine. Mild TTP to lower lumbar spine. No pain to the paraspinous muscles. No ecchymosis or  abrasion to the back. TTP to right lateral ribs. TTP to left lateral knee. Multiple abrasions to BUE and BLE.  Neurological: He is alert and oriented to person, place, and time.  Mental Status:  Alert, thought content appropriate, able to give a coherent history. Speech fluent without evidence of aphasia. Able to follow 2 step commands without difficulty.  Cranial Nerves:  II:  pupils equal, round, reactive to light III,IV, VI: ptosis not present, extra-ocular motions intact bilaterally  V,VII: smile symmetric, facial light touch sensation equal VIII: hearing grossly normal to voice  X: uvula elevates symmetrically  XI: bilateral shoulder shrug symmetric and strong XII: midline tongue extension without fassiculations Motor:  Normal tone. 5/5 strength of BUE and BLE major muscle groups including strong and equal grip strength and dorsiflexion/plantar flexion Sensory: light touch normal in all extremities. CV: 2+ radial and DP/PT pulses  Skin: Skin is warm and dry.  Large area of ecchymosis to RUQ of abd, additional large area of ecchymosis to right upper chest. Multiple abrasion over elbows and knees bilaterally. Skin tear to right dorsal wrist without evidence of infection. No active bleeding. Abrasion to right upper thigh.  Psychiatric: He has a normal mood and affect.  Nursing note and vitals reviewed.    ED Treatments / Results  Labs (all labs ordered are listed, but only abnormal results are displayed) Labs Reviewed  CBC - Abnormal; Notable for the following components:      Result Value   RBC 4.19 (*)    HCT 36.3 (*)    MCHC 37.7  (*)    All other components within normal limits  COMPREHENSIVE METABOLIC PANEL - Abnormal; Notable for the following components:   Sodium 117 (*)    Potassium 3.3 (*)    Chloride 77 (*)    Glucose, Bld 102 (*)    Calcium 8.6 (*)    AST 70 (*)    All other components within normal limits  URINALYSIS, ROUTINE W REFLEX MICROSCOPIC - Abnormal; Notable for the following components:   Ketones, ur 5 (*)    Leukocytes, UA TRACE (*)    Squamous Epithelial / LPF 0-5 (*)    All other components within normal limits  BASIC METABOLIC PANEL - Abnormal; Notable for the following components:   Sodium 119 (*)    Potassium 2.9 (*)    Chloride 78 (*)    Calcium 8.8 (*)    All other components within normal limits  URINE CULTURE  TROPONIN I  ETHANOL  PROTIME-INR  RAPID URINE DRUG SCREEN, HOSP PERFORMED  TSH  URIC ACID  SODIUM, URINE, RANDOM  OSMOLALITY, URINE  BASIC METABOLIC PANEL  BASIC METABOLIC PANEL  BASIC METABOLIC PANEL  OSMOLALITY  CORTISOL  CBC  HEPATIC FUNCTION PANEL    EKG  EKG Interpretation  Date/Time:  Friday November 30 2017 15:54:44 EST Ventricular Rate:  73 PR Interval:    QRS Duration: 157 QT Interval:  461 QTC Calculation: 508 R Axis:   -74 Text Interpretation:  Atrial-sensed ventricular-paced rhythm No further analysis attempted due to paced rhythm No significant change since last tracing Confirmed by Isla Pence [phone]) on 11/30/2017 3:59:37 PM       Radiology Ct Head Wo Contrast  Result Date: 11/30/2017 CLINICAL DATA:  Patient fell yesterday.  Posttraumatic headache. EXAM: CT HEAD WITHOUT CONTRAST CT CERVICAL SPINE WITHOUT CONTRAST TECHNIQUE: Multidetector CT imaging of the head and cervical spine was performed following the standard protocol without intravenous contrast. Multiplanar CT image reconstructions  of the cervical spine were also generated. COMPARISON:  08/15/2016 head CT and 11/15/2017 neck CT FINDINGS: CT HEAD FINDINGS Brain: Chronic stable  involutional changes of the brain consistent with atrophy with chronic small vessel ischemia. No large vascular territory infarction, hemorrhage or midline shift. No hydrocephalus. No intra-axial mass nor extra-axial fluid collections. Vascular: No hyperdense vessels. Moderate atherosclerosis of the carotid siphons. Skull: No skull fracture. Sinuses/Orbits: No acute abnormality.  Intact orbits and globes. Other: No significant soft tissue swelling. CT CERVICAL SPINE FINDINGS Alignment: Intact craniocervical relationship and atlantodental interval. Minimal grade 1 anterolisthesis C3 on C4 likely on the basis of degenerative disc and facet arthropathy. Skull base and vertebrae: No acute fracture. No primary bone lesion or focal pathologic process. Soft tissues and spinal canal: No prevertebral fluid or swelling. No visible canal hematoma. Disc levels: Prominent anterior osteophytes C4 through C6 with small posterior marginal osteophytes. No significant central canal stenosis. Minimal bilateral neural foraminal encroachment from uncinate spurring bilaterally at C2-3, C4-5, C5-6 and C6-7. Upper chest: Not included Other: Redemonstration of solid masslike abnormalities in the left parotid as described on recent neck CT from 11/15/2017. The largest is approximately 11 mm and may reflect a Warthin's gland tumor is initially stated or possibly pleomorphic adenoma or parotid lymph nodes. IMPRESSION: 1. Atrophy with chronic small vessel ischemia. No acute intracranial abnormality or skull fracture. 2. Cervical spondylosis without acute cervical spine fracture. 3. Redemonstration of solid nodules in the left parotid gland as seen on recent CT neck, the largest approximately 11 mm on current study. Electronically Signed   By: Ashley Royalty M.D.   On: 11/30/2017 19:50   Ct Chest W Contrast  Result Date: 11/30/2017 CLINICAL DATA:  Trauma, fall EXAM: CT CHEST, ABDOMEN, AND PELVIS WITH CONTRAST TECHNIQUE: Multidetector CT imaging  of the chest, abdomen and pelvis was performed following the standard protocol during bolus administration of intravenous contrast. CONTRAST:  154m ISOVUE-300 IOPAMIDOL (ISOVUE-300) INJECTION 61% COMPARISON:  Radiograph 08/14/2016 FINDINGS: CT CHEST FINDINGS Cardiovascular: Moderate aortic atherosclerosis. Dense calcification at the origin of the great vessels. Ectatic ascending aorta, measuring up to 3.9 cm. Coronary artery calcification. Partially visualized cardiac pacing leads. Dense mitral annulus calcification. Normal heart size. No pericardial effusion. Mediastinum/Nodes: No mediastinal hematoma. Midline trachea. No thyroid mass. No significant adenopathy. Esophagus within normal limits except for small distal esophageal hernia Lungs/Pleura: Mild apical emphysema. No pneumothorax. No consolidation. Musculoskeletal: Degenerative changes of the spine. No acute osseous abnormality. Old right third, fourth, fifth rib fractures and old left third, fourth, fifth, and sixth anterior rib fractures. CT ABDOMEN PELVIS FINDINGS Hepatobiliary: No hepatic injury or perihepatic hematoma. Gallbladder is unremarkable Pancreas: Unremarkable. No pancreatic ductal dilatation or surrounding inflammatory changes. Spleen: No splenic injury or perisplenic hematoma. Adrenals/Urinary Tract: No adrenal hemorrhage or renal injury identified. Bladder is unremarkable. Moderate perinephric fat stranding Stomach/Bowel: Stomach is within normal limits. Appendix appears normal. No evidence of bowel wall thickening, distention, or inflammatory changes. Vascular/Lymphatic: Moderate aortic atherosclerosis. No aneurysmal dilatation. Heavy calcification at the origin of the celiac and SMA vessels. Densely calcified renal artery origins. No significant adenopathy. Reproductive: Prostate is unremarkable. Other: Negative for free air or free fluid. Musculoskeletal: No acute or suspicious bone lesion. Degenerative changes of the spine. IMPRESSION:  1. No CT evidence for acute thoracic abnormality. No CT evidence for acute solid organ injury within the abdomen or pelvis, negative for free air or free fluid. 2. Ectatic ascending aorta.  Moderate aortic atherosclerosis. 3. Mild apical emphysema 4. Old  bilateral rib fractures Electronically Signed   By: Donavan Foil M.D.   On: 11/30/2017 19:57   Ct Cervical Spine Wo Contrast  Result Date: 11/30/2017 CLINICAL DATA:  Patient fell yesterday.  Posttraumatic headache. EXAM: CT HEAD WITHOUT CONTRAST CT CERVICAL SPINE WITHOUT CONTRAST TECHNIQUE: Multidetector CT imaging of the head and cervical spine was performed following the standard protocol without intravenous contrast. Multiplanar CT image reconstructions of the cervical spine were also generated. COMPARISON:  08/15/2016 head CT and 11/15/2017 neck CT FINDINGS: CT HEAD FINDINGS Brain: Chronic stable involutional changes of the brain consistent with atrophy with chronic small vessel ischemia. No large vascular territory infarction, hemorrhage or midline shift. No hydrocephalus. No intra-axial mass nor extra-axial fluid collections. Vascular: No hyperdense vessels. Moderate atherosclerosis of the carotid siphons. Skull: No skull fracture. Sinuses/Orbits: No acute abnormality.  Intact orbits and globes. Other: No significant soft tissue swelling. CT CERVICAL SPINE FINDINGS Alignment: Intact craniocervical relationship and atlantodental interval. Minimal grade 1 anterolisthesis C3 on C4 likely on the basis of degenerative disc and facet arthropathy. Skull base and vertebrae: No acute fracture. No primary bone lesion or focal pathologic process. Soft tissues and spinal canal: No prevertebral fluid or swelling. No visible canal hematoma. Disc levels: Prominent anterior osteophytes C4 through C6 with small posterior marginal osteophytes. No significant central canal stenosis. Minimal bilateral neural foraminal encroachment from uncinate spurring bilaterally at C2-3,  C4-5, C5-6 and C6-7. Upper chest: Not included Other: Redemonstration of solid masslike abnormalities in the left parotid as described on recent neck CT from 11/15/2017. The largest is approximately 11 mm and may reflect a Warthin's gland tumor is initially stated or possibly pleomorphic adenoma or parotid lymph nodes. IMPRESSION: 1. Atrophy with chronic small vessel ischemia. No acute intracranial abnormality or skull fracture. 2. Cervical spondylosis without acute cervical spine fracture. 3. Redemonstration of solid nodules in the left parotid gland as seen on recent CT neck, the largest approximately 11 mm on current study. Electronically Signed   By: Ashley Royalty M.D.   On: 11/30/2017 19:50   Ct Abdomen Pelvis W Contrast  Result Date: 11/30/2017 CLINICAL DATA:  Trauma, fall EXAM: CT CHEST, ABDOMEN, AND PELVIS WITH CONTRAST TECHNIQUE: Multidetector CT imaging of the chest, abdomen and pelvis was performed following the standard protocol during bolus administration of intravenous contrast. CONTRAST:  117m ISOVUE-300 IOPAMIDOL (ISOVUE-300) INJECTION 61% COMPARISON:  Radiograph 08/14/2016 FINDINGS: CT CHEST FINDINGS Cardiovascular: Moderate aortic atherosclerosis. Dense calcification at the origin of the great vessels. Ectatic ascending aorta, measuring up to 3.9 cm. Coronary artery calcification. Partially visualized cardiac pacing leads. Dense mitral annulus calcification. Normal heart size. No pericardial effusion. Mediastinum/Nodes: No mediastinal hematoma. Midline trachea. No thyroid mass. No significant adenopathy. Esophagus within normal limits except for small distal esophageal hernia Lungs/Pleura: Mild apical emphysema. No pneumothorax. No consolidation. Musculoskeletal: Degenerative changes of the spine. No acute osseous abnormality. Old right third, fourth, fifth rib fractures and old left third, fourth, fifth, and sixth anterior rib fractures. CT ABDOMEN PELVIS FINDINGS Hepatobiliary: No hepatic  injury or perihepatic hematoma. Gallbladder is unremarkable Pancreas: Unremarkable. No pancreatic ductal dilatation or surrounding inflammatory changes. Spleen: No splenic injury or perisplenic hematoma. Adrenals/Urinary Tract: No adrenal hemorrhage or renal injury identified. Bladder is unremarkable. Moderate perinephric fat stranding Stomach/Bowel: Stomach is within normal limits. Appendix appears normal. No evidence of bowel wall thickening, distention, or inflammatory changes. Vascular/Lymphatic: Moderate aortic atherosclerosis. No aneurysmal dilatation. Heavy calcification at the origin of the celiac and SMA vessels. Densely calcified renal artery origins.  No significant adenopathy. Reproductive: Prostate is unremarkable. Other: Negative for free air or free fluid. Musculoskeletal: No acute or suspicious bone lesion. Degenerative changes of the spine. IMPRESSION: 1. No CT evidence for acute thoracic abnormality. No CT evidence for acute solid organ injury within the abdomen or pelvis, negative for free air or free fluid. 2. Ectatic ascending aorta.  Moderate aortic atherosclerosis. 3. Mild apical emphysema 4. Old bilateral rib fractures Electronically Signed   By: Donavan Foil M.D.   On: 11/30/2017 19:57   Dg Knee Complete 4 Views Right  Result Date: 11/30/2017 CLINICAL DATA:  Status post fall. EXAM: RIGHT KNEE - COMPLETE 4+ VIEW COMPARISON:  07/04/2015 FINDINGS: No fracture. No subluxation or dislocation. Trace spurring in the patellofemoral compartment. No worrisome lytic or sclerotic osseous abnormality. No joint effusion. IMPRESSION: Negative. Electronically Signed   By: Misty Stanley M.D.   On: 11/30/2017 20:00    Procedures Procedures (including critical care time)  Medications Ordered in ED Medications  iopamidol (ISOVUE-300) 61 % injection (not administered)  allopurinol (ZYLOPRIM) tablet 300 mg (not administered)  amLODipine (NORVASC) tablet 5 mg (not administered)  atorvastatin  (LIPITOR) tablet 20 mg (not administered)  ferrous sulfate tablet 325 mg (not administered)  furosemide (LASIX) tablet 20 mg (not administered)  levETIRAcetam (KEPPRA XR) 24 hr tablet 1,500 mg (not administered)  lisinopril (PRINIVIL,ZESTRIL) tablet 20 mg (not administered)  metoprolol tartrate (LOPRESSOR) tablet 50 mg (not administered)  pantoprazole (PROTONIX) EC tablet 40 mg (not administered)  potassium chloride (K-DUR) CR tablet 10 mEq (not administered)  tamsulosin (FLOMAX) capsule 0.4 mg (not administered)  LORazepam (ATIVAN) tablet 1 mg (not administered)    Or  LORazepam (ATIVAN) injection 1 mg (not administered)  thiamine (VITAMIN B-1) tablet 100 mg (not administered)    Or  thiamine (B-1) injection 100 mg (not administered)  folic acid (FOLVITE) tablet 1 mg (not administered)  multivitamin with minerals tablet 1 tablet (not administered)  acetaminophen (TYLENOL) tablet 650 mg (not administered)    Or  acetaminophen (TYLENOL) suppository 650 mg (not administered)  ondansetron (ZOFRAN) tablet 4 mg (not administered)    Or  ondansetron (ZOFRAN) injection 4 mg (not administered)  LORazepam (ATIVAN) tablet 0-4 mg (not administered)    Followed by  LORazepam (ATIVAN) tablet 0-4 mg (not administered)  potassium chloride SA (K-DUR,KLOR-CON) CR tablet 60 mEq (not administered)  iopamidol (ISOVUE-300) 61 % injection 100 mL (100 mLs Intravenous Contrast Given 11/30/17 1910)     Initial Impression / Assessment and Plan / ED Course  I have reviewed the triage vital signs and the nursing notes.  Pertinent labs & imaging results that were available during my care of the patient were reviewed by me and considered in my medical decision making (see chart for details).  Evaluated pt in the ED. VSS and pt is alert and oriented. Noted bed bug on pts pillow. He is aware that he has issues with bed bugs. Nursing notified. Pt placed in room.     Discussed pt presentation and exam findings  with Dr. Gilford Raid, who will evaluate the pt. She reviewed the workup and agrees with the current plan for imaging. Advises to order ETOH, UDS, coags, trop.  Lab values reviewed and pt hyponatremic to 117. Reviewed labs with Dr. Gilford Raid who agrees with the plan for admission. She advises not to give NS or hypertonic saline, and states that pt will likely need fluid restriction.  Rechecked patient.  He is sitting in bed in no acute distress.  He is requesting something to eat.  Vital signs are stable at this time.  Patient is alert and oriented.  Discussed the results of the labs and imaging and plan for admission.   Discussed pt case with Dr. Hal Hope who will admit the pt.  10:54 Rechecked pt. patient is alert and in no acute distress.  BP 150/90.  Heart rate 85. Has tolerated PO.   Final Clinical Impressions(s) / ED Diagnoses   Final diagnoses:  Fall, initial encounter  Injury of head, initial encounter  Abdominal trauma, initial encounter  Acute pain of right knee  Bedbug bite, initial encounter  Acute hyponatremia  History of alcohol abuse   Pt with recent h/o frequent falls, and had fall last night. Reports head trauma, lower back pain, right knee pain, abd pain/bruising, right rib pain, and left hip pain. Will image and order labs.   Pt with h/o ETOH withdrawal seizures. Last drink was last night. Will place on CIWA protocol. Thiamine given.  ECG showed ventricular paced rhythm, and is unchanged from previous.   UA with trace leukocytes, will culture urine.   CT head negative for skull fracture or intracranial bleed. CT cervical spine negative for any acute fracture, prevertebral fluid, or swelling. CT chest/abd/pelvis negative for acute thoracic abnormality as well as negative for acute solid organ injury within the abdomen and pelvis. No free air or fluid noted. No new rib fractures noted. Right knee xray negative for fracture or dislocation.   CBC nonconcerning, However BMP  significant for Na of 117. This level of hyponatremia is new for him. Will plan for admission for further treatment of this.  Dr. Hal Hope will assume care of the patient.  ED Discharge Orders    None       Bishop Dublin 12/01/17 Ty Hilts, MD 12/01/17 [phone]

## 2017-11-30 NOTE — H&P (Signed)
History and Physical    JONATHAN CORPUS IWP:[id number] DOB: 06-29-1946 DOA: 11/30/2017  PCP: Orlena Sheldon, PA-C  Patient coming from: Home.  Chief Complaint: Fall.  HPI: Francisco [name] is a 72 y.o. male with history of alcohol abuse, complete heart block status post pacemaker placement, diastolic CHF, celiac disease, hypertension presents to the ER after patient has been having recurrent falls.  Patient states he had a fall yesterday and did hit his head but did not lose consciousness.  And has been having recurrent falls recently.  Denies any chest pain shortness of breath nausea vomiting or diarrhea.  Admits to drinking beer every day.  He has sustained multiple bruises.  ED Course: In the ER patient had CT of the abdomen chest neck and head which all did not show anything acute.  Labs revealed sodium around 117.  EKG shows paced rhythm.  Patient's blood pressure is also in the low normal.  Patient admitted for severe hyponatremia with recurrent falls.  On exam patient is alert awake and oriented.  Moves all extremities.  Review of Systems: As per HPI, rest all negative.   Past Medical History:  Diagnosis Date  . Anemia   . Celiac disease   . GERD (gastroesophageal reflux disease)   . Heart murmur   . Hypertension   . Multiple duodenal ulcers   . Shortness of breath   . Symptomatic Bradycardia    a. 07/2015 s/p MDT Advisa L DC PPM (Ser #: [id number]).  . 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 CARDIAC CATHETERIZATION N/A 07/08/2015   Procedure: Left Heart Cath and Coronary Angiography;  Surgeon: Troy Sine, MD;  Location: [place] [facility];  Service: Cardiovascular;  Laterality: N/A;  . CARDIAC CATHETERIZATION N/A 07/08/2015   Procedure: Temporary Pacemaker;  Surgeon: Troy Sine, MD;  Location: [place] [facility];  Service: Cardiovascular;  Laterality: N/A;  . CARDIAC CATHETERIZATION N/A  07/09/2015   Procedure: Temporary Pacemaker;  Surgeon: Troy Sine, MD;  Location: [place] [facility];  Service: Cardiovascular;  Laterality: N/A;  . EP IMPLANTABLE DEVICE N/A 07/09/2015   Procedure: Pacemaker Implant;  Surgeon: Will Meredith Leeds, MD;  Location: [place] [facility];  Service: Cardiovascular;  Laterality: N/A;  . ESOPHAGOGASTRODUODENOSCOPY  07/03/2012   Procedure: ESOPHAGOGASTRODUODENOSCOPY (EGD);  Surgeon: Winfield Cunas., MD;  Location[facility];  Service: Endoscopy;  Laterality: N/A;  . HEMORRHOID SURGERY    . HERNIA REPAIR    . MOLE REMOVAL       reports that he has quit smoking. His smoking use included cigarettes. He has a 25.00 pack-year smoking history. He has quit using smokeless tobacco. He reports that he drinks alcohol. He reports that he does not use drugs.  Allergies  Allergen Reactions  . Gluten Meal Other (See Comments)    Celiac Disease - pt states on 08/14/16 that he has been eating gluten products for about 3 months with no reaction    Family History  Problem Relation Age of Onset  . Hodgkin's lymphoma Father     Prior to Admission medications   Medication Sig Start Date End Date Taking? Authorizing Provider  allopurinol (ZYLOPRIM) 300 MG tablet TAKE 1 TABLET(300 MG) BY MOUTH DAILY 09/26/17  Yes Dena Billet B, PA-C  amLODipine (NORVASC) 5 MG tablet Take 5 mg by mouth daily.   Yes [provider]  atorvastatin (LIPITOR) 20 MG  tablet Take 1 tablet (20 mg total) by mouth daily. 11/23/17  Yes [place], Modena Nunnery, MD  ferrous sulfate 325 (65 FE) MG tablet Take 1 tablet (325 mg total) by mouth 2 (two) times daily with a meal. Patient taking differently: Take 325 mg by mouth daily.  07/12/15  Yes Velvet Bathe, MD  furosemide (LASIX) 20 MG tablet TAKE 1 TABLET(20 MG) BY MOUTH DAILY 06/21/17  Yes [place], Modena Nunnery, MD  levETIRAcetam (KEPPRA XR) 500 MG 24 hr tablet TAKE 3 TABLETS BY MOUTH DAILY 06/29/17  Yes Cameron Sprang, MD  lisinopril  (PRINIVIL,ZESTRIL) 20 MG tablet Take 1 tablet (20 mg total) by mouth daily. 02/22/17  Yes Dena Billet B, PA-C  metoprolol tartrate (LOPRESSOR) 50 MG tablet Take 1 tablet (50 mg total) by mouth 2 (two) times daily. 07/27/17  Yes [place], Will Hassell Done, MD  naproxen sodium (ALEVE) 220 MG tablet Take 220 mg by mouth 2 (two) times daily as needed (pain/ headache).   Yes [provider]  pantoprazole (PROTONIX) 40 MG tablet TAKE 1 TABLET BY MOUTH TWICE DAILY BEFORE A MEAL 08/29/17  Yes Dena Billet B, PA-C  potassium chloride (K-DUR) 10 MEQ tablet TAKE 1 TABLET(10 MEQ) BY MOUTH DAILY 08/29/17  Yes Dena Billet B, PA-C  tamsulosin (FLOMAX) 0.4 MG CAPS capsule TAKE 1 CAPSULE(0.4 MG) BY MOUTH DAILY 08/29/17  Yes Dena Billet B, PA-C  azithromycin (ZITHROMAX) 250 MG tablet Day 1: Take 2 daily. Days 2 -5: Take 1 daily. Patient not taking: Reported on 11/30/2017 02/22/17   Dena Billet B, PA-C  clotrimazole-betamethasone (LOTRISONE) cream APPLY EXTERNALLY TO THE AFFECTED AREA TWICE DAILY Patient not taking: Reported on 11/30/2017 03/22/17   Alycia Rossetti, MD  colchicine 0.6 MG tablet Take 1 tablet (0.6 mg total) by mouth 2 (two) times daily. Take twice daily for 2 weeks, then use as needed for gout flare up Patient not taking: Reported on 11/30/2017 12/17/15   Orlena Sheldon, PA-C    Physical Exam: Vitals:   11/30/17 1645 11/30/17 1730 11/30/17 2041 11/30/17 2042  BP: 127/66 132/60 104/81 104/81  Pulse: 74 66 76 75  Resp: 12 12 15    Temp:      TempSrc:      SpO2: 91% 99% 96%       Constitutional: Moderately built and nourished. Vitals:   11/30/17 1645 11/30/17 1730 11/30/17 2041 11/30/17 2042  BP: 127/66 132/60 104/81 104/81  Pulse: 74 66 76 75  Resp: 12 12 15    Temp:      TempSrc:      SpO2: 91% 99% 96%    Eyes: Anicteric no pallor. ENMT: No discharge from the ears eyes nose or mouth. Neck: No mass felt.  No JVD appreciated. Respiratory: No rhonchi or crepitations. Cardiovascular: S1-S2  heard no murmurs appreciated. Abdomen: Soft nontender bowel sounds present. Musculoskeletal: Multiple bruises. Skin: Multiple ecchymotic areas. Neurologic: Alert awake oriented to time place and person.  Moves all extremities. Psychiatric: Appears normal.  Normal affect.   Labs on Admission: I have personally reviewed following labs and imaging studies  CBC: Recent Labs  Lab 11/30/17 1541  WBC 8.4  HGB 13.7  HCT 36.3*  MCV 86.6  PLT 124   Basic Metabolic Panel: Recent Labs  Lab 11/30/17 1541  NA 117*  K 3.3*  CL 77*  CO2 26  GLUCOSE 102*  BUN 13  CREATININE 1.13  CALCIUM 8.6*   GFR: CrCl cannot be calculated (Unknown ideal weight.). Liver Function Tests: Recent  Labs  Lab 11/30/17 1541  AST 70*  ALT 30  ALKPHOS 71  BILITOT 1.2  PROT 7.1  ALBUMIN 3.7   No results for input(s): LIPASE, AMYLASE in the last 168 hours. No results for input(s): AMMONIA in the last 168 hours. Coagulation Profile: Recent Labs  Lab 11/30/17 1541  INR 0.92   Cardiac Enzymes: Recent Labs  Lab 11/30/17 1541  TROPONINI <0.03   BNP (last 3 results) No results for input(s): PROBNP in the last 8760 hours. HbA1C: No results for input(s): HGBA1C in the last 72 hours. CBG: No results for input(s): GLUCAP in the last 168 hours. Lipid Profile: No results for input(s): CHOL, HDL, LDLCALC, TRIG, CHOLHDL, LDLDIRECT in the last 72 hours. Thyroid Function Tests: No results for input(s): TSH, T4TOTAL, FREET4, T3FREE, THYROIDAB in the last 72 hours. Anemia Panel: No results for input(s): VITAMINB12, FOLATE, FERRITIN, TIBC, IRON, RETICCTPCT in the last 72 hours. Urine analysis:    Component Value Date/Time   COLORURINE YELLOW 11/30/2017 [place] 11/30/2017 1558   LABSPEC 1.005 11/30/2017 1558   PHURINE 6.0 11/30/2017 1558   GLUCOSEU NEGATIVE 11/30/2017 1558   HGBUR NEGATIVE 11/30/2017 1558   BILIRUBINUR NEGATIVE 11/30/2017 1558   KETONESUR 5 (A) 11/30/2017 1558    PROTEINUR NEGATIVE 11/30/2017 1558   UROBILINOGEN 0.2 08/13/2014 1010   NITRITE NEGATIVE 11/30/2017 1558   LEUKOCYTESUR TRACE (A) 11/30/2017 1558   Sepsis Labs: @LABRCNTIP (procalcitonin:4,lacticidven:4) )No results found for this or any previous visit (from the past 240 hour(s)).   Radiological Exams on Admission: Ct Head Wo Contrast  Result Date: 11/30/2017 CLINICAL DATA:  Patient fell yesterday.  Posttraumatic headache. EXAM: CT HEAD WITHOUT CONTRAST CT CERVICAL SPINE WITHOUT CONTRAST TECHNIQUE: Multidetector CT imaging of the head and cervical spine was performed following the standard protocol without intravenous contrast. Multiplanar CT image reconstructions of the cervical spine were also generated. COMPARISON:  08/15/2016 head CT and 11/15/2017 neck CT FINDINGS: CT HEAD FINDINGS Brain: Chronic stable involutional changes of the brain consistent with atrophy with chronic small vessel ischemia. No large vascular territory infarction, hemorrhage or midline shift. No hydrocephalus. No intra-axial mass nor extra-axial fluid collections. Vascular: No hyperdense vessels. Moderate atherosclerosis of the carotid siphons. Skull: No skull fracture. Sinuses/Orbits: No acute abnormality.  Intact orbits and globes. Other: No significant soft tissue swelling. CT CERVICAL SPINE FINDINGS Alignment: Intact craniocervical relationship and atlantodental interval. Minimal grade 1 anterolisthesis C3 on C4 likely on the basis of degenerative disc and facet arthropathy. Skull base and vertebrae: No acute fracture. No primary bone lesion or focal pathologic process. Soft tissues and spinal canal: No prevertebral fluid or swelling. No visible canal hematoma. Disc levels: Prominent anterior osteophytes C4 through C6 with small posterior marginal osteophytes. No significant central canal stenosis. Minimal bilateral neural foraminal encroachment from uncinate spurring bilaterally at C2-3, C4-5, C5-6 and C6-7. Upper chest: Not  included Other: Redemonstration of solid masslike abnormalities in the left parotid as described on recent neck CT from 11/15/2017. The largest is approximately 11 mm and may reflect a Warthin's gland tumor is initially stated or possibly pleomorphic adenoma or parotid lymph nodes. IMPRESSION: 1. Atrophy with chronic small vessel ischemia. No acute intracranial abnormality or skull fracture. 2. Cervical spondylosis without acute cervical spine fracture. 3. Redemonstration of solid nodules in the left parotid gland as seen on recent CT neck, the largest approximately 11 mm on current study. Electronically Signed   By: Ashley Royalty M.D.   On: 11/30/2017 19:50  Ct Chest W Contrast  Result Date: 11/30/2017 CLINICAL DATA:  Trauma, fall EXAM: CT CHEST, ABDOMEN, AND PELVIS WITH CONTRAST TECHNIQUE: Multidetector CT imaging of the chest, abdomen and pelvis was performed following the standard protocol during bolus administration of intravenous contrast. CONTRAST:  168m ISOVUE-300 IOPAMIDOL (ISOVUE-300) INJECTION 61% COMPARISON:  Radiograph 08/14/2016 FINDINGS: CT CHEST FINDINGS Cardiovascular: Moderate aortic atherosclerosis. Dense calcification at the origin of the great vessels. Ectatic ascending aorta, measuring up to 3.9 cm. Coronary artery calcification. Partially visualized cardiac pacing leads. Dense mitral annulus calcification. Normal heart size. No pericardial effusion. Mediastinum/Nodes: No mediastinal hematoma. Midline trachea. No thyroid mass. No significant adenopathy. Esophagus within normal limits except for small distal esophageal hernia Lungs/Pleura: Mild apical emphysema. No pneumothorax. No consolidation. Musculoskeletal: Degenerative changes of the spine. No acute osseous abnormality. Old right third, fourth, fifth rib fractures and old left third, fourth, fifth, and sixth anterior rib fractures. CT ABDOMEN PELVIS FINDINGS Hepatobiliary: No hepatic injury or perihepatic hematoma. Gallbladder is  unremarkable Pancreas: Unremarkable. No pancreatic ductal dilatation or surrounding inflammatory changes. Spleen: No splenic injury or perisplenic hematoma. Adrenals/Urinary Tract: No adrenal hemorrhage or renal injury identified. Bladder is unremarkable. Moderate perinephric fat stranding Stomach/Bowel: Stomach is within normal limits. Appendix appears normal. No evidence of bowel wall thickening, distention, or inflammatory changes. Vascular/Lymphatic: Moderate aortic atherosclerosis. No aneurysmal dilatation. Heavy calcification at the origin of the celiac and SMA vessels. Densely calcified renal artery origins. No significant adenopathy. Reproductive: Prostate is unremarkable. Other: Negative for free air or free fluid. Musculoskeletal: No acute or suspicious bone lesion. Degenerative changes of the spine. IMPRESSION: 1. No CT evidence for acute thoracic abnormality. No CT evidence for acute solid organ injury within the abdomen or pelvis, negative for free air or free fluid. 2. Ectatic ascending aorta.  Moderate aortic atherosclerosis. 3. Mild apical emphysema 4. Old bilateral rib fractures Electronically Signed   By: KDonavan FoilM.D.   On: 11/30/2017 19:57   Ct Cervical Spine Wo Contrast  Result Date: 11/30/2017 CLINICAL DATA:  Patient fell yesterday.  Posttraumatic headache. EXAM: CT HEAD WITHOUT CONTRAST CT CERVICAL SPINE WITHOUT CONTRAST TECHNIQUE: Multidetector CT imaging of the head and cervical spine was performed following the standard protocol without intravenous contrast. Multiplanar CT image reconstructions of the cervical spine were also generated. COMPARISON:  08/15/2016 head CT and 11/15/2017 neck CT FINDINGS: CT HEAD FINDINGS Brain: Chronic stable involutional changes of the brain consistent with atrophy with chronic small vessel ischemia. No large vascular territory infarction, hemorrhage or midline shift. No hydrocephalus. No intra-axial mass nor extra-axial fluid collections. Vascular:  No hyperdense vessels. Moderate atherosclerosis of the carotid siphons. Skull: No skull fracture. Sinuses/Orbits: No acute abnormality.  Intact orbits and globes. Other: No significant soft tissue swelling. CT CERVICAL SPINE FINDINGS Alignment: Intact craniocervical relationship and atlantodental interval. Minimal grade 1 anterolisthesis C3 on C4 likely on the basis of degenerative disc and facet arthropathy. Skull base and vertebrae: No acute fracture. No primary bone lesion or focal pathologic process. Soft tissues and spinal canal: No prevertebral fluid or swelling. No visible canal hematoma. Disc levels: Prominent anterior osteophytes C4 through C6 with small posterior marginal osteophytes. No significant central canal stenosis. Minimal bilateral neural foraminal encroachment from uncinate spurring bilaterally at C2-3, C4-5, C5-6 and C6-7. Upper chest: Not included Other: Redemonstration of solid masslike abnormalities in the left parotid as described on recent neck CT from 11/15/2017. The largest is approximately 11 mm and may reflect a Warthin's gland tumor is initially stated or possibly  pleomorphic adenoma or parotid lymph nodes. IMPRESSION: 1. Atrophy with chronic small vessel ischemia. No acute intracranial abnormality or skull fracture. 2. Cervical spondylosis without acute cervical spine fracture. 3. Redemonstration of solid nodules in the left parotid gland as seen on recent CT neck, the largest approximately 11 mm on current study. Electronically Signed   By: Ashley Royalty M.D.   On: 11/30/2017 19:50   Ct Abdomen Pelvis W Contrast  Result Date: 11/30/2017 CLINICAL DATA:  Trauma, fall EXAM: CT CHEST, ABDOMEN, AND PELVIS WITH CONTRAST TECHNIQUE: Multidetector CT imaging of the chest, abdomen and pelvis was performed following the standard protocol during bolus administration of intravenous contrast. CONTRAST:  170m ISOVUE-300 IOPAMIDOL (ISOVUE-300) INJECTION 61% COMPARISON:  Radiograph 08/14/2016  FINDINGS: CT CHEST FINDINGS Cardiovascular: Moderate aortic atherosclerosis. Dense calcification at the origin of the great vessels. Ectatic ascending aorta, measuring up to 3.9 cm. Coronary artery calcification. Partially visualized cardiac pacing leads. Dense mitral annulus calcification. Normal heart size. No pericardial effusion. Mediastinum/Nodes: No mediastinal hematoma. Midline trachea. No thyroid mass. No significant adenopathy. Esophagus within normal limits except for small distal esophageal hernia Lungs/Pleura: Mild apical emphysema. No pneumothorax. No consolidation. Musculoskeletal: Degenerative changes of the spine. No acute osseous abnormality. Old right third, fourth, fifth rib fractures and old left third, fourth, fifth, and sixth anterior rib fractures. CT ABDOMEN PELVIS FINDINGS Hepatobiliary: No hepatic injury or perihepatic hematoma. Gallbladder is unremarkable Pancreas: Unremarkable. No pancreatic ductal dilatation or surrounding inflammatory changes. Spleen: No splenic injury or perisplenic hematoma. Adrenals/Urinary Tract: No adrenal hemorrhage or renal injury identified. Bladder is unremarkable. Moderate perinephric fat stranding Stomach/Bowel: Stomach is within normal limits. Appendix appears normal. No evidence of bowel wall thickening, distention, or inflammatory changes. Vascular/Lymphatic: Moderate aortic atherosclerosis. No aneurysmal dilatation. Heavy calcification at the origin of the celiac and SMA vessels. Densely calcified renal artery origins. No significant adenopathy. Reproductive: Prostate is unremarkable. Other: Negative for free air or free fluid. Musculoskeletal: No acute or suspicious bone lesion. Degenerative changes of the spine. IMPRESSION: 1. No CT evidence for acute thoracic abnormality. No CT evidence for acute solid organ injury within the abdomen or pelvis, negative for free air or free fluid. 2. Ectatic ascending aorta.  Moderate aortic atherosclerosis. 3. Mild  apical emphysema 4. Old bilateral rib fractures Electronically Signed   By: KDonavan FoilM.D.   On: 11/30/2017 19:57   Dg Knee Complete 4 Views Right  Result Date: 11/30/2017 CLINICAL DATA:  Status post fall. EXAM: RIGHT KNEE - COMPLETE 4+ VIEW COMPARISON:  07/04/2015 FINDINGS: No fracture. No subluxation or dislocation. Trace spurring in the patellofemoral compartment. No worrisome lytic or sclerotic osseous abnormality. No joint effusion. IMPRESSION: Negative. Electronically Signed   By: EMisty StanleyM.D.   On: 11/30/2017 20:00    EKG: Independently reviewed.  Paced rhythm.  Assessment/Plan Principal Problem:   Acute hyponatremia Active Problems:   Celiac disease   HTN (hypertension)   Alcohol abuse, daily use   Hypertrophic obstructive cardiomyopathy (HOCM) (HCC)   Chronic diastolic congestive heart failure (HCC)   HLD (hyperlipidemia)   Seizures (HCC)   Gout    1. Acute severe hyponatremia -likely from alcohol abuse.  Will fluid restrict at this time.  Patient blood pressure is in the low normal.  If there is any further decline in systolic blood pressure may need fluid boluses.  Closely follow metabolic panel for sodium trends.  Will check urine sodium urine osmolality serum osmolality serum cortisol and TSH levels.  Based on the further trends  and patient's mental status we will closely address patient's hyponatremia. 2. Alcohol abuse -patient has been placed on CIWA protocol. 3. Hypertension -since patient blood pressure is in the low normal will hold off lisinopril amlodipine and will keep holding parameters for metoprolol. 4. History of seizures on Keppra -patient states he has not had any seizure episodes and has been compliant with Keppra which will be continued. 5. History of celiac disease. 6. History of diastolic CHF presently patient's blood pressures in the low normal.  Will hold Lasix. 7. History of gout on allopurinol. 8. Recurrent falls -we will get physical therapy  consult. 9. Mild hypokalemia could be from poor oral intake.  Replace and recheck.   DVT prophylaxis: SCDs for now. Code Status: Full code. Family Communication: Discussed with patient. Disposition Plan: To be determined. Consults called: Physical therapy. Admission status: Observation.   Rise Patience MD [facility] Pager [phone].  If 7PM-7AM, please contact night-coverage [email or web address] Password [facility] [place]  11/30/2017, 9:38 PM

## 2017-12-01 DIAGNOSIS — K900 Celiac disease: Secondary | ICD-10-CM

## 2017-12-01 DIAGNOSIS — I10 Essential (primary) hypertension: Secondary | ICD-10-CM | POA: Diagnosis not present

## 2017-12-01 DIAGNOSIS — E785 Hyperlipidemia, unspecified: Secondary | ICD-10-CM

## 2017-12-01 DIAGNOSIS — R569 Unspecified convulsions: Secondary | ICD-10-CM

## 2017-12-01 DIAGNOSIS — K219 Gastro-esophageal reflux disease without esophagitis: Secondary | ICD-10-CM

## 2017-12-01 DIAGNOSIS — I442 Atrioventricular block, complete: Secondary | ICD-10-CM | POA: Diagnosis not present

## 2017-12-01 DIAGNOSIS — R296 Repeated falls: Secondary | ICD-10-CM | POA: Diagnosis present

## 2017-12-01 DIAGNOSIS — F1010 Alcohol abuse, uncomplicated: Secondary | ICD-10-CM | POA: Diagnosis not present

## 2017-12-01 DIAGNOSIS — Z87898 Personal history of other specified conditions: Secondary | ICD-10-CM

## 2017-12-01 DIAGNOSIS — M1000 Idiopathic gout, unspecified site: Secondary | ICD-10-CM | POA: Diagnosis not present

## 2017-12-01 DIAGNOSIS — E876 Hypokalemia: Secondary | ICD-10-CM

## 2017-12-01 DIAGNOSIS — I5032 Chronic diastolic (congestive) heart failure: Secondary | ICD-10-CM | POA: Diagnosis not present

## 2017-12-01 DIAGNOSIS — W19XXXD Unspecified fall, subsequent encounter: Secondary | ICD-10-CM | POA: Diagnosis not present

## 2017-12-01 DIAGNOSIS — F1011 Alcohol abuse, in remission: Secondary | ICD-10-CM

## 2017-12-01 DIAGNOSIS — W19XXXA Unspecified fall, initial encounter: Secondary | ICD-10-CM | POA: Diagnosis present

## 2017-12-01 DIAGNOSIS — E871 Hypo-osmolality and hyponatremia: Secondary | ICD-10-CM | POA: Diagnosis not present

## 2017-12-01 LAB — BASIC METABOLIC PANEL
Anion gap: 15 (ref 5–15)
Anion gap: 13 (ref 5–15)
Anion gap: 13 (ref 5–15)
Anion gap: 12 (ref 5–15)
BUN: 13 mg/dL (ref 6–20)
BUN: 14 mg/dL (ref 6–20)
BUN: 12 mg/dL (ref 6–20)
BUN: 13 mg/dL (ref 6–20)
CO2: 26 mmol/L (ref 22–32)
CO2: 28 mmol/L (ref 22–32)
CO2: 29 mmol/L (ref 22–32)
CO2: 27 mmol/L (ref 22–32)
Calcium: 8.9 mg/dL (ref 8.9–10.3)
Calcium: 8.9 mg/dL (ref 8.9–10.3)
Calcium: 9.2 mg/dL (ref 8.9–10.3)
Calcium: 9.1 mg/dL (ref 8.9–10.3)
Chloride: 79 mmol/L — ABNORMAL LOW (ref 101–111)
Chloride: 82 mmol/L — ABNORMAL LOW (ref 101–111)
Chloride: 82 mmol/L — ABNORMAL LOW (ref 101–111)
Chloride: 86 mmol/L — ABNORMAL LOW (ref 101–111)
Creatinine, Ser: 1.09 mg/dL (ref 0.61–1.24)
Creatinine, Ser: 0.98 mg/dL (ref 0.61–1.24)
Creatinine, Ser: 1.04 mg/dL (ref 0.61–1.24)
Creatinine, Ser: 1.09 mg/dL (ref 0.61–1.24)
GFR calc Af Amer: >60 mL/min (ref >60)
GFR calc Af Amer: >60 mL/min (ref >60)
GFR calc Af Amer: >60 mL/min (ref >60)
GFR calc Af Amer: >60 mL/min (ref >60)
GFR calc non Af Amer: >60 mL/min (ref >60)
GFR calc non Af Amer: >60 mL/min (ref >60)
GFR calc non Af Amer: >60 mL/min (ref >60)
GFR calc non Af Amer: >60 mL/min (ref >60)
Glucose, Bld: 102 mg/dL — ABNORMAL HIGH (ref 65–99)
Glucose, Bld: 86 mg/dL (ref 65–99)
Glucose, Bld: 102 mg/dL — ABNORMAL HIGH (ref 65–99)
Glucose, Bld: 104 mg/dL — ABNORMAL HIGH (ref 65–99)
Potassium: 2.8 mmol/L — ABNORMAL LOW (ref 3.5–5.1)
Potassium: 3.2 mmol/L — ABNORMAL LOW (ref 3.5–5.1)
Potassium: 3.3 mmol/L — ABNORMAL LOW (ref 3.5–5.1)
Potassium: 4.3 mmol/L (ref 3.5–5.1)
Sodium: 120 mmol/L — ABNORMAL LOW (ref 135–145)
Sodium: 123 mmol/L — ABNORMAL LOW (ref 135–145)
Sodium: 124 mmol/L — ABNORMAL LOW (ref 135–145)
Sodium: 125 mmol/L — ABNORMAL LOW (ref 135–145)

## 2017-12-01 LAB — OSMOLALITY, URINE: Osmolality, Ur: 199 mOsm/kg — ABNORMAL LOW (ref 300–900)

## 2017-12-01 LAB — CBC
HCT: 35.6 % — ABNORMAL LOW (ref 39.0–52.0)
Hemoglobin: 13 g/dL (ref 13.0–17.0)
MCH: 31.9 pg (ref 26.0–34.0)
MCHC: 36.5 g/dL — ABNORMAL HIGH (ref 30.0–36.0)
MCV: 87.5 fL (ref 78.0–100.0)
Platelets: 217 10*3/uL (ref 150–400)
RBC: 4.07 MIL/uL — ABNORMAL LOW (ref 4.22–5.81)
RDW: 13.1 % (ref 11.5–15.5)
WBC: 5.7 10*3/uL (ref 4.0–10.5)

## 2017-12-01 LAB — CK: Total CK: 390 U/L (ref 49–397)

## 2017-12-01 LAB — MAGNESIUM: Magnesium: 1.7 mg/dL (ref 1.7–2.4)

## 2017-12-01 LAB — URINE CULTURE: Culture: 10000 — AB

## 2017-12-01 LAB — OSMOLALITY: Osmolality: 253 mOsm/kg — ABNORMAL LOW (ref 275–295)

## 2017-12-01 LAB — HEPATIC FUNCTION PANEL
ALT: 27 U/L (ref 17–63)
AST: 61 U/L — ABNORMAL HIGH (ref 15–41)
Albumin: 3.4 g/dL — ABNORMAL LOW (ref 3.5–5.0)
Alkaline Phosphatase: 63 U/L (ref 38–126)
Bilirubin, Direct: 0.2 mg/dL (ref 0.1–0.5)
Indirect Bilirubin: 0.8 mg/dL (ref 0.3–0.9)
Total Bilirubin: 1 mg/dL (ref 0.3–1.2)
Total Protein: 6.8 g/dL (ref 6.5–8.1)

## 2017-12-01 LAB — MRSA PCR SCREENING: MRSA by PCR: NEGATIVE

## 2017-12-01 LAB — CORTISOL: Cortisol, Plasma: 7.6 ug/dL

## 2017-12-01 LAB — TROPONIN I: Troponin I: 0.03 ng/mL (ref <0.03)

## 2017-12-01 LAB — SODIUM, URINE, RANDOM: Sodium, Ur: 24 mmol/L

## 2017-12-01 MED ORDER — SODIUM CHLORIDE 0.9 % IV BOLUS (SEPSIS): 250.0000 mL | Freq: Once | INTRAVENOUS | Status: DC

## 2017-12-01 MED ORDER — HYDRALAZINE HCL 20 MG/ML IJ SOLN: 10.0000 mg | INTRAMUSCULAR | Status: DC | PRN

## 2017-12-01 MED ORDER — POTASSIUM CHLORIDE CRYS ER 20 MEQ PO TBCR
40.0000 meq | EXTENDED_RELEASE_TABLET | ORAL | Status: AC
  Administered 2017-12-01 (×2): 40 meq via ORAL
  Filled 2017-12-01 (×2): qty 2

## 2017-12-01 NOTE — Progress Notes (Signed)
Pt stating that he is not gluten intolerant and does not follow a gluten free diet at home; Requesting regular diet. MD paged.

## 2017-12-01 NOTE — Progress Notes (Signed)
Pt and family requesting to see social work regarding possible placement at nursing home following discharge. Spoke with social work and made them aware of patients request. Sister Roel Cluck requesting social work speak with her; Can be reached at [phone].

## 2017-12-01 NOTE — Evaluation (Signed)
Physical Therapy Evaluation Patient Details Name: Francisco [name] MRN: [id number] DOB: [birth year] Today's Date: 12/01/2017   History of Present Illness  72 year old gentleman history of alcohol abuse, complete heart block status post pacemaker placement, diastolic CHF, celiac disease, hypertension presented to the ED with recurrent falls and admitted for severe hyponatremia likely due to alcohol abuse.  Clinical Impression  Pt admitted with above diagnosis. Pt currently with functional limitations due to the deficits listed below (see PT Problem List).  Pt will benefit from skilled PT to increase their independence and safety with mobility to allow discharge to the venue listed below.  Pt with LOB upon standing and requires frequent steadying assist during ambulation with RW.  Pt is HIGH FALL RISK and reports frequent falls at home with worsening balance.  Pt agreeable to SNF upon d/c.     Follow Up Recommendations SNF;Supervision/Assistance - 24 hour    Equipment Recommendations  None recommended by PT    Recommendations for Other Services       Precautions / Restrictions Precautions Precautions: Fall      Mobility  Bed Mobility Overal bed mobility: Needs Assistance Bed Mobility: Supine to Sit     Supine to sit: Supervision     General bed mobility comments: supervision for safety and lines  Transfers Overall transfer level: Needs assistance Equipment used: Rolling walker (2 wheeled) Transfers: Sit to/from Stand Sit to Stand: Mod assist;+2 safety/equipment         General transfer comment: verbal cues for hand placement, mod assist to rise and steady - mostly for steadying, cues for pushing down on RW to self assist stability  Ambulation/Gait Ambulation/Gait assistance: Min assist;+2 safety/equipment Ambulation Distance (Feet): 120 Feet Assistive device: Rolling walker (2 wheeled) Gait Pattern/deviations: Step-through pattern;Wide base of support     General  Gait Details: tends to walk on his heels, ?decreased sensation /proprioception from gait presentation, multiple steadying assist required  Stairs            Wheelchair Mobility    Modified Rankin (Stroke Patients Only)       Balance Overall balance assessment: Needs assistance;History of Falls         Standing balance support: Bilateral upper extremity supported;During functional activity Standing balance-Leahy Scale: Zero                               Pertinent Vitals/Pain Pain Assessment: No/denies pain    Home Living Family/patient expects to be discharged to:: Private residence Living Arrangements: Alone   Type of Home: House Home Access: Stairs to enter   Technical brewer of Steps: 2 Home Layout: Able to live on main level with bedroom/bathroom Home Equipment: Walker - 2 wheels      Prior Function Level of Independence: Independent with assistive device(s)         Comments: reports using RW and having multiple falls, balance progressively worsening per pt     Hand Dominance        Extremity/Trunk Assessment        Lower Extremity Assessment Lower Extremity Assessment: Generalized weakness       Communication   Communication: Expressive difficulties(slightly dysarthric speech)  Cognition Arousal/Alertness: Awake/alert Behavior During Therapy: WFL for tasks assessed/performed Overall Cognitive Status: Impaired/Different from baseline Area of Impairment: Safety/judgement                         Safety/Judgement: Decreased  awareness of safety     General Comments: reports poor balance however poor safety awareness once mobile      General Comments General comments (skin integrity, edema, etc.): multiple scabs and bruises on LEs and elbows due to falls prior to admission    Exercises     Assessment/Plan    PT Assessment Patient needs continued PT services  PT Problem List Decreased strength;Decreased  mobility;Decreased activity tolerance;Decreased balance;Decreased knowledge of use of DME;Decreased safety awareness       PT Treatment Interventions Gait training;Therapeutic activities;DME instruction;Therapeutic exercise;Functional mobility training;Balance training;Patient/family education    PT Goals (Current goals can be found in the Care Plan section)  Acute Rehab PT Goals PT Goal Formulation: With patient Time For Goal Achievement: 12/15/17 Potential to Achieve Goals: Good    Frequency Min 3X/week   Barriers to discharge        Co-evaluation               AM-PAC PT "6 Clicks" Daily Activity  Outcome Measure Difficulty turning over in bed (including adjusting bedclothes, sheets and blankets)?: None Difficulty moving from lying on back to sitting on the side of the bed? : None Difficulty sitting down on and standing up from a chair with arms (e.g., wheelchair, bedside commode, etc,.)?: Unable Help needed moving to and from a bed to chair (including a wheelchair)?: A Lot Help needed walking in hospital room?: A Little Help needed climbing 3-5 steps with a railing? : A Lot 6 Click Score: 16    End of Session Equipment Utilized During Treatment: Gait belt Activity Tolerance: Patient tolerated treatment well Patient left: in chair;with call bell/phone within reach(on chair alarm pad, no box in room, NT notified and aware) Nurse Communication: Mobility status(observed pt ambulating) PT Visit Diagnosis: Repeated falls (R29.6);Difficulty in walking, not elsewhere classified (R26.2)    Time: 2811-8867 PT Time Calculation (min) (ACUTE ONLY): 13 min   Charges:   PT Evaluation $PT Eval Low Complexity: 1 Low     PT G Codes:        Carmelia Bake, PT, DPT 12/01/2017 Pager: [phone]  York Ram E 12/01/2017, 12:48 PM

## 2017-12-01 NOTE — Progress Notes (Signed)
PROGRESS NOTE    Francisco [name]  QIW:[id number] DOB: 1945-12-24 DOA: 11/30/2017 PCP: Orlena Sheldon, PA-C    Brief Narrative:  Patient is a 72 year old gentleman history of alcohol abuse, complete heart block status post pacemaker placement, diastolic CHF, celiac disease, hypertension presented to the ED with recurrent falls.  Patient underwent CT head neck chest abdomen with no acute abnormalities.  Lab work reviewed severe hyponatremia with a sodium of 117.   Assessment & Plan:   Principal Problem:   Acute hyponatremia Active Problems:   Generalized weakness   Celiac disease   GERD (gastroesophageal reflux disease)   Alcohol abuse, daily use   Hypertrophic obstructive cardiomyopathy (HOCM) (HCC)   Hypokalemia   Essential hypertension   Chronic diastolic congestive heart failure (HCC)   HLD (hyperlipidemia)   Complete heart block (HCC)   Seizures (HCC)   Gout   Falls  #1 acute severe hyponatremia ??  Etiology.  Likely secondary to alcohol abuse.  Patient noted to have low blood pressure this morning and received a 250 cc bolus of normal saline.  Sodium levels improving slowly and currently at 123 from 117 on admission.  Continue fluid restriction of 1000 cc/day.  Serial BMETS.  Follow.  2.  Alcohol abuse Currently stable.  Patient with no signs of withdrawal at this time.  Continue the Ativan withdrawal protocol.  Continue thiamine, folic acid, multivitamin.  Follow.  3.  Hypokalemia Check a magnesium level.  Replete.  4.  Hypertension Blood pressure was borderline this morning and patient received a bolus of normal saline.  Continue Lopressor.  5.  Gastroesophageal reflux disease PPI.  6.  History of seizures Stable.  Continue Keppra.  7.  Falls Likely secondary to alcohol abuse, debility and severe hyponatremia.  Fall precautions.  PT/OT.  Will likely need skilled nursing facility once medically stable.  8.  Gout Stable.  Continue allopurinol.  9.  Chronic  diastolic heart failure/HOCM Currently compensated.  Blood pressure was low normal.  Diuretics on hold.  Continue Lopressor.  Follow.  10.  History of celiac disease Continue gluten-free diet.  11.  Hx complete heart block status post PPM Stable.  Follow.  #12 hyperlipidemia Continue statin.   DVT prophylaxis: SCDs Code Status: Full Family Communication: Updated patient.  No family at bedside. Disposition Plan: To be determined.  Skilled nursing facility versus home with home health once medically stable and hyponatremia has resolved.   Consultants:   None  Procedures:   CT head 11/30/2017  CT chest 11/30/2017  CT C-spine 11/30/2017  CT abdomen and pelvis 11/30/2017  Plain films of the right knee 11/30/2017  Antimicrobials:   None   Subjective: Patient sitting up in bed.  Denies any chest pain.  No shortness of breath.  States he understands he is not going to be able to go directly home from here due to generalized weakness all may need help at home.  Objective: Vitals:   12/01/17 0500 12/01/17 0600 12/01/17 0800 12/01/17 0903  BP: (!) 88/69 120/60  (!) 145/68  Pulse: 64 64  70  Resp: 18     Temp:   (!) 97.5 F (36.4 C)   TempSrc:   Oral   SpO2: 96% 98%    Weight:      Height:        Intake/Output Summary (Last 24 hours) at 12/01/2017 0939 Last data filed at 12/01/2017 0900 Gross per 24 hour  Intake 120 ml  Output 850 ml  Net -  730 ml   Filed Weights   12/01/17 0013  Weight: 86.7 kg (191 lb 2.2 oz)    Examination:  General exam: Appears calm and comfortable  Respiratory system: Clear to auscultation. Respiratory effort normal. Cardiovascular system: S1 & S2 heard, RRR. No JVD, murmurs, rubs, gallops or clicks. No pedal edema. Gastrointestinal system: Abdomen is nondistended, soft and nontender. No organomegaly or masses felt. Normal bowel sounds heard. Central nervous system: Alert and oriented. No focal neurological deficits. Extremities:  Symmetric 5 x 5 power. Skin: No rashes, lesions or ulcers Psychiatry: Judgement and insight appear normal. Mood & affect appropriate.     Data Reviewed: I have personally reviewed following labs and imaging studies  CBC: Recent Labs  Lab 11/30/17 1541 12/01/17 0528  WBC 8.4 5.7  HGB 13.7 13.0  HCT 36.3* 35.6*  MCV 86.6 87.5  PLT 213 163   Basic Metabolic Panel: Recent Labs  Lab 11/30/17 1541 11/30/17 2159 12/01/17 0106 12/01/17 0528  NA 117* 119* 120* 123*  K 3.3* 2.9* 2.8* 3.2*  CL 77* 78* 79* 82*  CO2 26 27 26 28   GLUCOSE 102* 96 102* 86  BUN 13 11 13 14   CREATININE 1.13 1.03 1.09 0.98  CALCIUM 8.6* 8.8* 8.9 8.9   GFR: Estimated Creatinine Clearance: 74.3 mL/min (by C-G formula based on SCr of 0.98 mg/dL). Liver Function Tests: Recent Labs  Lab 11/30/17 1541 12/01/17 0528  AST 70* 61*  ALT 30 27  ALKPHOS 71 63  BILITOT 1.2 1.0  PROT 7.1 6.8  ALBUMIN 3.7 3.4*   No results for input(s): LIPASE, AMYLASE in the last 168 hours. No results for input(s): AMMONIA in the last 168 hours. Coagulation Profile: Recent Labs  Lab 11/30/17 1541  INR 0.92   Cardiac Enzymes: Recent Labs  Lab 11/30/17 1541 12/01/17 0528  CKTOTAL  --  390  TROPONINI <0.03 0.03*   BNP (last 3 results) No results for input(s): PROBNP in the last 8760 hours. HbA1C: No results for input(s): HGBA1C in the last 72 hours. CBG: No results for input(s): GLUCAP in the last 168 hours. Lipid Profile: No results for input(s): CHOL, HDL, LDLCALC, TRIG, CHOLHDL, LDLDIRECT in the last 72 hours. Thyroid Function Tests: Recent Labs    11/30/17 1541  TSH 1.372   Anemia Panel: No results for input(s): VITAMINB12, FOLATE, FERRITIN, TIBC, IRON, RETICCTPCT in the last 72 hours. Sepsis Labs: No results for input(s): PROCALCITON, LATICACIDVEN in the last 168 hours.  Recent Results (from the past 240 hour(s))  MRSA PCR Screening     Status: None   Collection Time: 12/01/17  5:00 AM  Result  Value Ref Range Status   MRSA by PCR NEGATIVE NEGATIVE Final    Comment:        The GeneXpert MRSA Assay (FDA approved for NASAL specimens only), is one component of a comprehensive MRSA colonization surveillance program. It is not intended to diagnose MRSA infection nor to guide or monitor treatment for MRSA infections.          Radiology Studies: Ct Head Wo Contrast  Result Date: 11/30/2017 CLINICAL DATA:  Patient fell yesterday.  Posttraumatic headache. EXAM: CT HEAD WITHOUT CONTRAST CT CERVICAL SPINE WITHOUT CONTRAST TECHNIQUE: Multidetector CT imaging of the head and cervical spine was performed following the standard protocol without intravenous contrast. Multiplanar CT image reconstructions of the cervical spine were also generated. COMPARISON:  08/15/2016 head CT and 11/15/2017 neck CT FINDINGS: CT HEAD FINDINGS Brain: Chronic stable involutional changes of the  brain consistent with atrophy with chronic small vessel ischemia. No large vascular territory infarction, hemorrhage or midline shift. No hydrocephalus. No intra-axial mass nor extra-axial fluid collections. Vascular: No hyperdense vessels. Moderate atherosclerosis of the carotid siphons. Skull: No skull fracture. Sinuses/Orbits: No acute abnormality.  Intact orbits and globes. Other: No significant soft tissue swelling. CT CERVICAL SPINE FINDINGS Alignment: Intact craniocervical relationship and atlantodental interval. Minimal grade 1 anterolisthesis C3 on C4 likely on the basis of degenerative disc and facet arthropathy. Skull base and vertebrae: No acute fracture. No primary bone lesion or focal pathologic process. Soft tissues and spinal canal: No prevertebral fluid or swelling. No visible canal hematoma. Disc levels: Prominent anterior osteophytes C4 through C6 with small posterior marginal osteophytes. No significant central canal stenosis. Minimal bilateral neural foraminal encroachment from uncinate spurring bilaterally  at C2-3, C4-5, C5-6 and C6-7. Upper chest: Not included Other: Redemonstration of solid masslike abnormalities in the left parotid as described on recent neck CT from 11/15/2017. The largest is approximately 11 mm and may reflect a Warthin's gland tumor is initially stated or possibly pleomorphic adenoma or parotid lymph nodes. IMPRESSION: 1. Atrophy with chronic small vessel ischemia. No acute intracranial abnormality or skull fracture. 2. Cervical spondylosis without acute cervical spine fracture. 3. Redemonstration of solid nodules in the left parotid gland as seen on recent CT neck, the largest approximately 11 mm on current study. Electronically Signed   By: Ashley Royalty M.D.   On: 11/30/2017 19:50   Ct Chest W Contrast  Result Date: 11/30/2017 CLINICAL DATA:  Trauma, fall EXAM: CT CHEST, ABDOMEN, AND PELVIS WITH CONTRAST TECHNIQUE: Multidetector CT imaging of the chest, abdomen and pelvis was performed following the standard protocol during bolus administration of intravenous contrast. CONTRAST:  166m ISOVUE-300 IOPAMIDOL (ISOVUE-300) INJECTION 61% COMPARISON:  Radiograph 08/14/2016 FINDINGS: CT CHEST FINDINGS Cardiovascular: Moderate aortic atherosclerosis. Dense calcification at the origin of the great vessels. Ectatic ascending aorta, measuring up to 3.9 cm. Coronary artery calcification. Partially visualized cardiac pacing leads. Dense mitral annulus calcification. Normal heart size. No pericardial effusion. Mediastinum/Nodes: No mediastinal hematoma. Midline trachea. No thyroid mass. No significant adenopathy. Esophagus within normal limits except for small distal esophageal hernia Lungs/Pleura: Mild apical emphysema. No pneumothorax. No consolidation. Musculoskeletal: Degenerative changes of the spine. No acute osseous abnormality. Old right third, fourth, fifth rib fractures and old left third, fourth, fifth, and sixth anterior rib fractures. CT ABDOMEN PELVIS FINDINGS Hepatobiliary: No hepatic  injury or perihepatic hematoma. Gallbladder is unremarkable Pancreas: Unremarkable. No pancreatic ductal dilatation or surrounding inflammatory changes. Spleen: No splenic injury or perisplenic hematoma. Adrenals/Urinary Tract: No adrenal hemorrhage or renal injury identified. Bladder is unremarkable. Moderate perinephric fat stranding Stomach/Bowel: Stomach is within normal limits. Appendix appears normal. No evidence of bowel wall thickening, distention, or inflammatory changes. Vascular/Lymphatic: Moderate aortic atherosclerosis. No aneurysmal dilatation. Heavy calcification at the origin of the celiac and SMA vessels. Densely calcified renal artery origins. No significant adenopathy. Reproductive: Prostate is unremarkable. Other: Negative for free air or free fluid. Musculoskeletal: No acute or suspicious bone lesion. Degenerative changes of the spine. IMPRESSION: 1. No CT evidence for acute thoracic abnormality. No CT evidence for acute solid organ injury within the abdomen or pelvis, negative for free air or free fluid. 2. Ectatic ascending aorta.  Moderate aortic atherosclerosis. 3. Mild apical emphysema 4. Old bilateral rib fractures Electronically Signed   By: KDonavan FoilM.D.   On: 11/30/2017 19:57   Ct Cervical Spine Wo Contrast  Result Date: 11/30/2017  CLINICAL DATA:  Patient fell yesterday.  Posttraumatic headache. EXAM: CT HEAD WITHOUT CONTRAST CT CERVICAL SPINE WITHOUT CONTRAST TECHNIQUE: Multidetector CT imaging of the head and cervical spine was performed following the standard protocol without intravenous contrast. Multiplanar CT image reconstructions of the cervical spine were also generated. COMPARISON:  08/15/2016 head CT and 11/15/2017 neck CT FINDINGS: CT HEAD FINDINGS Brain: Chronic stable involutional changes of the brain consistent with atrophy with chronic small vessel ischemia. No large vascular territory infarction, hemorrhage or midline shift. No hydrocephalus. No intra-axial mass  nor extra-axial fluid collections. Vascular: No hyperdense vessels. Moderate atherosclerosis of the carotid siphons. Skull: No skull fracture. Sinuses/Orbits: No acute abnormality.  Intact orbits and globes. Other: No significant soft tissue swelling. CT CERVICAL SPINE FINDINGS Alignment: Intact craniocervical relationship and atlantodental interval. Minimal grade 1 anterolisthesis C3 on C4 likely on the basis of degenerative disc and facet arthropathy. Skull base and vertebrae: No acute fracture. No primary bone lesion or focal pathologic process. Soft tissues and spinal canal: No prevertebral fluid or swelling. No visible canal hematoma. Disc levels: Prominent anterior osteophytes C4 through C6 with small posterior marginal osteophytes. No significant central canal stenosis. Minimal bilateral neural foraminal encroachment from uncinate spurring bilaterally at C2-3, C4-5, C5-6 and C6-7. Upper chest: Not included Other: Redemonstration of solid masslike abnormalities in the left parotid as described on recent neck CT from 11/15/2017. The largest is approximately 11 mm and may reflect a Warthin's gland tumor is initially stated or possibly pleomorphic adenoma or parotid lymph nodes. IMPRESSION: 1. Atrophy with chronic small vessel ischemia. No acute intracranial abnormality or skull fracture. 2. Cervical spondylosis without acute cervical spine fracture. 3. Redemonstration of solid nodules in the left parotid gland as seen on recent CT neck, the largest approximately 11 mm on current study. Electronically Signed   By: Ashley Royalty M.D.   On: 11/30/2017 19:50   Ct Abdomen Pelvis W Contrast  Result Date: 11/30/2017 CLINICAL DATA:  Trauma, fall EXAM: CT CHEST, ABDOMEN, AND PELVIS WITH CONTRAST TECHNIQUE: Multidetector CT imaging of the chest, abdomen and pelvis was performed following the standard protocol during bolus administration of intravenous contrast. CONTRAST:  127m ISOVUE-300 IOPAMIDOL (ISOVUE-300)  INJECTION 61% COMPARISON:  Radiograph 08/14/2016 FINDINGS: CT CHEST FINDINGS Cardiovascular: Moderate aortic atherosclerosis. Dense calcification at the origin of the great vessels. Ectatic ascending aorta, measuring up to 3.9 cm. Coronary artery calcification. Partially visualized cardiac pacing leads. Dense mitral annulus calcification. Normal heart size. No pericardial effusion. Mediastinum/Nodes: No mediastinal hematoma. Midline trachea. No thyroid mass. No significant adenopathy. Esophagus within normal limits except for small distal esophageal hernia Lungs/Pleura: Mild apical emphysema. No pneumothorax. No consolidation. Musculoskeletal: Degenerative changes of the spine. No acute osseous abnormality. Old right third, fourth, fifth rib fractures and old left third, fourth, fifth, and sixth anterior rib fractures. CT ABDOMEN PELVIS FINDINGS Hepatobiliary: No hepatic injury or perihepatic hematoma. Gallbladder is unremarkable Pancreas: Unremarkable. No pancreatic ductal dilatation or surrounding inflammatory changes. Spleen: No splenic injury or perisplenic hematoma. Adrenals/Urinary Tract: No adrenal hemorrhage or renal injury identified. Bladder is unremarkable. Moderate perinephric fat stranding Stomach/Bowel: Stomach is within normal limits. Appendix appears normal. No evidence of bowel wall thickening, distention, or inflammatory changes. Vascular/Lymphatic: Moderate aortic atherosclerosis. No aneurysmal dilatation. Heavy calcification at the origin of the celiac and SMA vessels. Densely calcified renal artery origins. No significant adenopathy. Reproductive: Prostate is unremarkable. Other: Negative for free air or free fluid. Musculoskeletal: No acute or suspicious bone lesion. Degenerative changes of the spine. IMPRESSION:  1. No CT evidence for acute thoracic abnormality. No CT evidence for acute solid organ injury within the abdomen or pelvis, negative for free air or free fluid. 2. Ectatic ascending  aorta.  Moderate aortic atherosclerosis. 3. Mild apical emphysema 4. Old bilateral rib fractures Electronically Signed   By: Donavan Foil M.D.   On: 11/30/2017 19:57   Dg Knee Complete 4 Views Right  Result Date: 11/30/2017 CLINICAL DATA:  Status post fall. EXAM: RIGHT KNEE - COMPLETE 4+ VIEW COMPARISON:  07/04/2015 FINDINGS: No fracture. No subluxation or dislocation. Trace spurring in the patellofemoral compartment. No worrisome lytic or sclerotic osseous abnormality. No joint effusion. IMPRESSION: Negative. Electronically Signed   By: Misty Stanley M.D.   On: 11/30/2017 20:00        Scheduled Meds: . allopurinol  300 mg Oral Daily  . atorvastatin  20 mg Oral q1800  . ferrous sulfate  325 mg Oral Daily  . folic acid  1 mg Oral Daily  . levETIRAcetam  1,500 mg Oral Daily  . LORazepam  0-4 mg Oral Q6H   Followed by  . [START ON 12/03/2017] LORazepam  0-4 mg Oral Q12H  . metoprolol tartrate  50 mg Oral BID  . multivitamin with minerals  1 tablet Oral Daily  . pantoprazole  40 mg Oral BID  . potassium chloride  40 mEq Oral Q4H  . tamsulosin  0.4 mg Oral QPC supper  . thiamine  100 mg Oral Daily   Or  . thiamine  100 mg Intravenous Daily   Continuous Infusions: . sodium chloride       LOS: 0 days    Time spent: [place], MD [facility] Pager [phone]  If 7PM-7AM, please contact night-coverage [email or web address] Password [facility] 12/01/2017, 9:39 AM

## 2017-12-02 ENCOUNTER — Other Ambulatory Visit: Payer: Self-pay

## 2017-12-02 DIAGNOSIS — E785 Hyperlipidemia, unspecified: Secondary | ICD-10-CM | POA: Diagnosis present

## 2017-12-02 DIAGNOSIS — K219 Gastro-esophageal reflux disease without esophagitis: Secondary | ICD-10-CM | POA: Diagnosis not present

## 2017-12-02 DIAGNOSIS — W19XXXD Unspecified fall, subsequent encounter: Secondary | ICD-10-CM | POA: Diagnosis not present

## 2017-12-02 DIAGNOSIS — M1000 Idiopathic gout, unspecified site: Secondary | ICD-10-CM | POA: Diagnosis not present

## 2017-12-02 DIAGNOSIS — Z950 Presence of cardiac pacemaker: Secondary | ICD-10-CM | POA: Diagnosis not present

## 2017-12-02 DIAGNOSIS — R350 Frequency of micturition: Secondary | ICD-10-CM | POA: Diagnosis present

## 2017-12-02 DIAGNOSIS — W1809XA Striking against other object with subsequent fall, initial encounter: Secondary | ICD-10-CM | POA: Diagnosis present

## 2017-12-02 DIAGNOSIS — S80212A Abrasion, left knee, initial encounter: Secondary | ICD-10-CM | POA: Diagnosis present

## 2017-12-02 DIAGNOSIS — F1010 Alcohol abuse, uncomplicated: Secondary | ICD-10-CM | POA: Diagnosis not present

## 2017-12-02 DIAGNOSIS — E876 Hypokalemia: Secondary | ICD-10-CM | POA: Diagnosis not present

## 2017-12-02 DIAGNOSIS — W19XXXA Unspecified fall, initial encounter: Secondary | ICD-10-CM

## 2017-12-02 DIAGNOSIS — I421 Obstructive hypertrophic cardiomyopathy: Secondary | ICD-10-CM | POA: Diagnosis not present

## 2017-12-02 DIAGNOSIS — S20211A Contusion of right front wall of thorax, initial encounter: Secondary | ICD-10-CM | POA: Diagnosis present

## 2017-12-02 DIAGNOSIS — Z87891 Personal history of nicotine dependence: Secondary | ICD-10-CM | POA: Diagnosis not present

## 2017-12-02 DIAGNOSIS — R531 Weakness: Secondary | ICD-10-CM | POA: Diagnosis not present

## 2017-12-02 DIAGNOSIS — I10 Essential (primary) hypertension: Secondary | ICD-10-CM | POA: Diagnosis not present

## 2017-12-02 DIAGNOSIS — M109 Gout, unspecified: Secondary | ICD-10-CM | POA: Diagnosis present

## 2017-12-02 DIAGNOSIS — J80 Acute respiratory distress syndrome: Secondary | ICD-10-CM | POA: Diagnosis not present

## 2017-12-02 DIAGNOSIS — E871 Hypo-osmolality and hyponatremia: Secondary | ICD-10-CM | POA: Diagnosis not present

## 2017-12-02 DIAGNOSIS — M25561 Pain in right knee: Secondary | ICD-10-CM

## 2017-12-02 DIAGNOSIS — R296 Repeated falls: Secondary | ICD-10-CM | POA: Diagnosis present

## 2017-12-02 DIAGNOSIS — R569 Unspecified convulsions: Secondary | ICD-10-CM | POA: Diagnosis not present

## 2017-12-02 DIAGNOSIS — Y92009 Unspecified place in unspecified non-institutional (private) residence as the place of occurrence of the external cause: Secondary | ICD-10-CM | POA: Diagnosis not present

## 2017-12-02 DIAGNOSIS — I5032 Chronic diastolic (congestive) heart failure: Secondary | ICD-10-CM | POA: Diagnosis not present

## 2017-12-02 DIAGNOSIS — I442 Atrioventricular block, complete: Secondary | ICD-10-CM | POA: Diagnosis not present

## 2017-12-02 DIAGNOSIS — M6281 Muscle weakness (generalized): Secondary | ICD-10-CM | POA: Diagnosis not present

## 2017-12-02 DIAGNOSIS — S80211A Abrasion, right knee, initial encounter: Secondary | ICD-10-CM | POA: Diagnosis present

## 2017-12-02 DIAGNOSIS — K900 Celiac disease: Secondary | ICD-10-CM | POA: Diagnosis not present

## 2017-12-02 DIAGNOSIS — Z9181 History of falling: Secondary | ICD-10-CM | POA: Diagnosis not present

## 2017-12-02 DIAGNOSIS — Z8711 Personal history of peptic ulcer disease: Secondary | ICD-10-CM | POA: Diagnosis not present

## 2017-12-02 DIAGNOSIS — I110 Hypertensive heart disease with heart failure: Secondary | ICD-10-CM | POA: Diagnosis not present

## 2017-12-02 DIAGNOSIS — M2550 Pain in unspecified joint: Secondary | ICD-10-CM | POA: Diagnosis not present

## 2017-12-02 DIAGNOSIS — R5381 Other malaise: Secondary | ICD-10-CM | POA: Diagnosis present

## 2017-12-02 DIAGNOSIS — F1020 Alcohol dependence, uncomplicated: Secondary | ICD-10-CM | POA: Diagnosis not present

## 2017-12-02 LAB — BASIC METABOLIC PANEL
Anion gap: 10 (ref 5–15)
BUN: 12 mg/dL (ref 6–20)
CO2: 29 mmol/L (ref 22–32)
Calcium: 9 mg/dL (ref 8.9–10.3)
Chloride: 89 mmol/L — ABNORMAL LOW (ref 101–111)
Creatinine, Ser: 0.92 mg/dL (ref 0.61–1.24)
GFR calc Af Amer: >60 mL/min (ref >60)
GFR calc non Af Amer: >60 mL/min (ref >60)
Glucose, Bld: 107 mg/dL — ABNORMAL HIGH (ref 65–99)
Potassium: 4 mmol/L (ref 3.5–5.1)
Sodium: 128 mmol/L — ABNORMAL LOW (ref 135–145)

## 2017-12-02 MED ORDER — AMLODIPINE BESYLATE 5 MG PO TABS: 5.0000 mg | ORAL_TABLET | Freq: Every day | ORAL | Status: DC

## 2017-12-02 MED ORDER — BISACODYL 5 MG PO TBEC
5.0000 mg | DELAYED_RELEASE_TABLET | Freq: Once | ORAL | Status: AC
  Administered 2017-12-02: 5 mg via ORAL
  Filled 2017-12-02: qty 1

## 2017-12-02 NOTE — Plan of Care (Signed)
  Progressing Education: Knowledge of General Education information will improve 12/02/2017 0417 - Progressing by Orma Render, RN Health Behavior/Discharge Planning: Ability to manage health-related needs will improve 12/02/2017 0417 - Progressing by Orma Render, RN Clinical Measurements: Ability to maintain clinical measurements within normal limits will improve 12/02/2017 0417 - Progressing by Orma Render, RN Will remain free from infection 12/02/2017 0417 - Progressing by Orma Render, RN Diagnostic test results will improve 12/02/2017 0417 - Progressing by Orma Render, RN Respiratory complications will improve 12/02/2017 0417 - Progressing by Orma Render, RN Cardiovascular complication will be avoided 12/02/2017 0417 - Progressing by Orma Render, RN Activity: Risk for activity intolerance will decrease 12/02/2017 0417 - Progressing by Orma Render, RN Nutrition: Adequate nutrition will be maintained 12/02/2017 0417 - Progressing by Orma Render, RN Coping: Level of anxiety will decrease 12/02/2017 0417 - Progressing by Orma Render, RN Elimination: Will not experience complications related to urinary retention 12/02/2017 0417 - Progressing by Orma Render, RN Pain Managment: General experience of comfort will improve 12/02/2017 0417 - Progressing by Orma Render, RN Safety: Ability to remain free from injury will improve 12/02/2017 0417 - Progressing by Orma Render, RN Skin Integrity: Risk for impaired skin integrity will decrease 12/02/2017 0417 - Progressing by Orma Render, RN Education: Understanding of discharge needs will improve 12/02/2017 0417 - Progressing by Orbin Mayeux, Coralee Pesa, RN

## 2017-12-02 NOTE — Progress Notes (Addendum)
PROGRESS NOTE    Francisco [name]  WOE:[id number] DOB: 1945-12-22 DOA: 11/30/2017 PCP: Orlena Sheldon, PA-C    Brief Narrative:  Patient is a 72 year old gentleman history of alcohol abuse, complete heart block status post pacemaker placement, diastolic CHF, celiac disease, hypertension presented to the ED with recurrent falls.  Patient underwent CT head neck chest abdomen with no acute abnormalities.  Lab work reviewed severe hyponatremia with a sodium of 117.   Assessment & Plan:   Principal Problem:   Acute hyponatremia Active Problems:   Generalized weakness   Celiac disease   GERD (gastroesophageal reflux disease)   Alcohol abuse, daily use   Hypertrophic obstructive cardiomyopathy (HOCM) (HCC)   Hypokalemia   Essential hypertension   Chronic diastolic congestive heart failure (HCC)   HLD (hyperlipidemia)   Complete heart block (HCC)   Seizures (HCC)   Gout   Falls   History of alcohol abuse  #1 acute severe hyponatremia ??  Etiology.  Likely secondary to alcohol abuse.  Patient noted to have low blood pressure the morning of 12/01/2017, and received a 250 cc bolus of normal saline.  Sodium levels improving slowly and currently at 128 from 125 from 124 from 123 from 117 on admission.  Continue fluid restriction of 1000 cc/day.  Serial BMETS.  Follow.  2.  Alcohol abuse Currently stable.  Patient with no signs of withdrawal at this time.  Continue the Ativan withdrawal protocol.  Continue thiamine, folic acid, multivitamin.  Follow.  3.  Hypokalemia Magnesium level was 1.7.  Magnesium has been repleted.   4.  Hypertension Blood pressure was borderline the morning of 12/01/2017, and patient received a bolus of normal saline.  Blood pressure improved.  Continue Lopressor.  5.  Gastroesophageal reflux disease Continue PPI.  6.  History of seizures Stable.  No seizures noted.  Continue Keppra.  7.  Falls Likely secondary to alcohol abuse, debility and severe  hyponatremia.  Fall precautions.  She has been seen by PT/OT were recommending skilled nursing facility.  Patient in agreement.  Social work consult placed.   8.  Gout Stable.  Continue allopurinol.  9.  Chronic diastolic heart failure/HOCM Currently compensated.  Blood pressure improved.  Continue to hold diuretics.  Continue home dose Lopressor.  Follow.   10.  History of celiac disease Per nursing patient stated he was not glucose intolerance and did not follow a gluten-free diet at home.  Patient requesting regular diet and as such this was changed to a regular diet.  Outpatient follow-up.    11.  Hx complete heart block status post PPM Stable.  Follow.  #12 hyperlipidemia Continue statin.   DVT prophylaxis: SCDs Code Status: Full Family Communication: Updated patient.  No family at bedside. Disposition Plan: Skilled nursing facility versus home with home health once medically stable and hyponatremia has resolved.   Consultants:   None  Procedures:   CT head 11/30/2017  CT chest 11/30/2017  CT C-spine 11/30/2017  CT abdomen and pelvis 11/30/2017  Plain films of the right knee 11/30/2017  Antimicrobials:   None   Subjective: Patient sitting up in bed doing a crossword puzzle.  Patient denies any chest pain or shortness of breath.  Patient states he is feeling better.  Tolerating oral intake.  Objective: Vitals:   12/02/17 0000 12/02/17 0340 12/02/17 0355 12/02/17 0800  BP: (!) 102/38  (!) 152/46 (!) 174/73  Pulse: 60  64 67  Resp: 20  15 20   Temp: 98.2 F (  36.8 C) 98.1 F (36.7 C)  97.7 F (36.5 C)  TempSrc: Oral Oral  Oral  SpO2: 95%  94% 95%  Weight:      Height:        Intake/Output Summary (Last 24 hours) at 12/02/2017 3664 Last data filed at 12/02/2017 0800 Gross per 24 hour  Intake 680 ml  Output 870 ml  Net -190 ml   Filed Weights   12/01/17 0013  Weight: 86.7 kg (191 lb 2.2 oz)    Examination:  General exam: Appears calm and  comfortable  Respiratory system: Clear to auscultation bilaterally with no wheezes, no crackles, no rhonchi. Respiratory effort normal. Cardiovascular system: Regular rate and rhythm with a 4-0/3 systolic ejection murmur in the left lower sternal border.  No JVD, murmurs, rubs, gallops or clicks. No pedal edema. Gastrointestinal system: Abdomen is nondistended, soft and nontender. No organomegaly or masses felt. Normal bowel sounds heard. Central nervous system: Alert and oriented. No focal neurological deficits. Extremities: Symmetric 5 x 5 power. Skin: No rashes, lesions or ulcers Psychiatry: Judgement and insight appear normal. Mood & affect appropriate.     Data Reviewed: I have personally reviewed following labs and imaging studies  CBC: Recent Labs  Lab 11/30/17 1541 12/01/17 0528  WBC 8.4 5.7  HGB 13.7 13.0  HCT 36.3* 35.6*  MCV 86.6 87.5  PLT 213 474   Basic Metabolic Panel: Recent Labs  Lab 12/01/17 0106 12/01/17 0528 12/01/17 0959 12/01/17 1456 12/02/17 0309  NA 120* 123* 124* 125* 128*  K 2.8* 3.2* 3.3* 4.3 4.0  CL 79* 82* 82* 86* 89*  CO2 26 28 29 27 29   GLUCOSE 102* 86 102* 104* 107*  BUN 13 14 12 13 12   CREATININE 1.09 0.98 1.04 1.09 0.92  CALCIUM 8.9 8.9 9.2 9.1 9.0  MG  --   --  1.7  --   --    GFR: Estimated Creatinine Clearance: 79.1 mL/min (by C-G formula based on SCr of 0.92 mg/dL). Liver Function Tests: Recent Labs  Lab 11/30/17 1541 12/01/17 0528  AST 70* 61*  ALT 30 27  ALKPHOS 71 63  BILITOT 1.2 1.0  PROT 7.1 6.8  ALBUMIN 3.7 3.4*   No results for input(s): LIPASE, AMYLASE in the last 168 hours. No results for input(s): AMMONIA in the last 168 hours. Coagulation Profile: Recent Labs  Lab 11/30/17 1541  INR 0.92   Cardiac Enzymes: Recent Labs  Lab 11/30/17 1541 12/01/17 0528  CKTOTAL  --  390  TROPONINI <0.03 0.03*   BNP (last 3 results) No results for input(s): PROBNP in the last 8760 hours. HbA1C: No results for  input(s): HGBA1C in the last 72 hours. CBG: No results for input(s): GLUCAP in the last 168 hours. Lipid Profile: No results for input(s): CHOL, HDL, LDLCALC, TRIG, CHOLHDL, LDLDIRECT in the last 72 hours. Thyroid Function Tests: Recent Labs    11/30/17 1541  TSH 1.372   Anemia Panel: No results for input(s): VITAMINB12, FOLATE, FERRITIN, TIBC, IRON, RETICCTPCT in the last 72 hours. Sepsis Labs: No results for input(s): PROCALCITON, LATICACIDVEN in the last 168 hours.  Recent Results (from the past 240 hour(s))  Urine culture     Status: Abnormal   Collection Time: 11/30/17  3:58 PM  Result Value Ref Range Status   Specimen Description URINE, RANDOM  Final   Special Requests NONE  Final   Culture (A)  Final    10,000 COLONIES/mL GROUP B STREP(S.AGALACTIAE)ISOLATED TESTING AGAINST S. AGALACTIAE NOT  ROUTINELY PERFORMED DUE TO PREDICTABILITY OF AMP/PEN/VAN SUSCEPTIBILITY. Performed at [facility], [place] [address], [place], [place] [address]    Report Status 12/01/2017 FINAL  Final  MRSA PCR Screening     Status: None   Collection Time: 12/01/17  5:00 AM  Result Value Ref Range Status   MRSA by PCR NEGATIVE NEGATIVE Final    Comment:        The GeneXpert MRSA Assay (FDA approved for NASAL specimens only), is one component of a comprehensive MRSA colonization surveillance program. It is not intended to diagnose MRSA infection nor to guide or monitor treatment for MRSA infections.          Radiology Studies: Ct Head Wo Contrast  Result Date: 11/30/2017 CLINICAL DATA:  Patient fell yesterday.  Posttraumatic headache. EXAM: CT HEAD WITHOUT CONTRAST CT CERVICAL SPINE WITHOUT CONTRAST TECHNIQUE: Multidetector CT imaging of the head and cervical spine was performed following the standard protocol without intravenous contrast. Multiplanar CT image reconstructions of the cervical spine were also generated. COMPARISON:  08/15/2016 head CT and 11/15/2017 neck CT FINDINGS:  CT HEAD FINDINGS Brain: Chronic stable involutional changes of the brain consistent with atrophy with chronic small vessel ischemia. No large vascular territory infarction, hemorrhage or midline shift. No hydrocephalus. No intra-axial mass nor extra-axial fluid collections. Vascular: No hyperdense vessels. Moderate atherosclerosis of the carotid siphons. Skull: No skull fracture. Sinuses/Orbits: No acute abnormality.  Intact orbits and globes. Other: No significant soft tissue swelling. CT CERVICAL SPINE FINDINGS Alignment: Intact craniocervical relationship and atlantodental interval. Minimal grade 1 anterolisthesis C3 on C4 likely on the basis of degenerative disc and facet arthropathy. Skull base and vertebrae: No acute fracture. No primary bone lesion or focal pathologic process. Soft tissues and spinal canal: No prevertebral fluid or swelling. No visible canal hematoma. Disc levels: Prominent anterior osteophytes C4 through C6 with small posterior marginal osteophytes. No significant central canal stenosis. Minimal bilateral neural foraminal encroachment from uncinate spurring bilaterally at C2-3, C4-5, C5-6 and C6-7. Upper chest: Not included Other: Redemonstration of solid masslike abnormalities in the left parotid as described on recent neck CT from 11/15/2017. The largest is approximately 11 mm and may reflect a Warthin's gland tumor is initially stated or possibly pleomorphic adenoma or parotid lymph nodes. IMPRESSION: 1. Atrophy with chronic small vessel ischemia. No acute intracranial abnormality or skull fracture. 2. Cervical spondylosis without acute cervical spine fracture. 3. Redemonstration of solid nodules in the left parotid gland as seen on recent CT neck, the largest approximately 11 mm on current study. Electronically Signed   By: Ashley Royalty M.D.   On: 11/30/2017 19:50   Ct Chest W Contrast  Result Date: 11/30/2017 CLINICAL DATA:  Trauma, fall EXAM: CT CHEST, ABDOMEN, AND PELVIS WITH  CONTRAST TECHNIQUE: Multidetector CT imaging of the chest, abdomen and pelvis was performed following the standard protocol during bolus administration of intravenous contrast. CONTRAST:  153m ISOVUE-300 IOPAMIDOL (ISOVUE-300) INJECTION 61% COMPARISON:  Radiograph 08/14/2016 FINDINGS: CT CHEST FINDINGS Cardiovascular: Moderate aortic atherosclerosis. Dense calcification at the origin of the great vessels. Ectatic ascending aorta, measuring up to 3.9 cm. Coronary artery calcification. Partially visualized cardiac pacing leads. Dense mitral annulus calcification. Normal heart size. No pericardial effusion. Mediastinum/Nodes: No mediastinal hematoma. Midline trachea. No thyroid mass. No significant adenopathy. Esophagus within normal limits except for small distal esophageal hernia Lungs/Pleura: Mild apical emphysema. No pneumothorax. No consolidation. Musculoskeletal: Degenerative changes of the spine. No acute osseous abnormality. Old right third, fourth, fifth rib fractures  and old left third, fourth, fifth, and sixth anterior rib fractures. CT ABDOMEN PELVIS FINDINGS Hepatobiliary: No hepatic injury or perihepatic hematoma. Gallbladder is unremarkable Pancreas: Unremarkable. No pancreatic ductal dilatation or surrounding inflammatory changes. Spleen: No splenic injury or perisplenic hematoma. Adrenals/Urinary Tract: No adrenal hemorrhage or renal injury identified. Bladder is unremarkable. Moderate perinephric fat stranding Stomach/Bowel: Stomach is within normal limits. Appendix appears normal. No evidence of bowel wall thickening, distention, or inflammatory changes. Vascular/Lymphatic: Moderate aortic atherosclerosis. No aneurysmal dilatation. Heavy calcification at the origin of the celiac and SMA vessels. Densely calcified renal artery origins. No significant adenopathy. Reproductive: Prostate is unremarkable. Other: Negative for free air or free fluid. Musculoskeletal: No acute or suspicious bone lesion.  Degenerative changes of the spine. IMPRESSION: 1. No CT evidence for acute thoracic abnormality. No CT evidence for acute solid organ injury within the abdomen or pelvis, negative for free air or free fluid. 2. Ectatic ascending aorta.  Moderate aortic atherosclerosis. 3. Mild apical emphysema 4. Old bilateral rib fractures Electronically Signed   By: Donavan Foil M.D.   On: 11/30/2017 19:57   Ct Cervical Spine Wo Contrast  Result Date: 11/30/2017 CLINICAL DATA:  Patient fell yesterday.  Posttraumatic headache. EXAM: CT HEAD WITHOUT CONTRAST CT CERVICAL SPINE WITHOUT CONTRAST TECHNIQUE: Multidetector CT imaging of the head and cervical spine was performed following the standard protocol without intravenous contrast. Multiplanar CT image reconstructions of the cervical spine were also generated. COMPARISON:  08/15/2016 head CT and 11/15/2017 neck CT FINDINGS: CT HEAD FINDINGS Brain: Chronic stable involutional changes of the brain consistent with atrophy with chronic small vessel ischemia. No large vascular territory infarction, hemorrhage or midline shift. No hydrocephalus. No intra-axial mass nor extra-axial fluid collections. Vascular: No hyperdense vessels. Moderate atherosclerosis of the carotid siphons. Skull: No skull fracture. Sinuses/Orbits: No acute abnormality.  Intact orbits and globes. Other: No significant soft tissue swelling. CT CERVICAL SPINE FINDINGS Alignment: Intact craniocervical relationship and atlantodental interval. Minimal grade 1 anterolisthesis C3 on C4 likely on the basis of degenerative disc and facet arthropathy. Skull base and vertebrae: No acute fracture. No primary bone lesion or focal pathologic process. Soft tissues and spinal canal: No prevertebral fluid or swelling. No visible canal hematoma. Disc levels: Prominent anterior osteophytes C4 through C6 with small posterior marginal osteophytes. No significant central canal stenosis. Minimal bilateral neural foraminal  encroachment from uncinate spurring bilaterally at C2-3, C4-5, C5-6 and C6-7. Upper chest: Not included Other: Redemonstration of solid masslike abnormalities in the left parotid as described on recent neck CT from 11/15/2017. The largest is approximately 11 mm and may reflect a Warthin's gland tumor is initially stated or possibly pleomorphic adenoma or parotid lymph nodes. IMPRESSION: 1. Atrophy with chronic small vessel ischemia. No acute intracranial abnormality or skull fracture. 2. Cervical spondylosis without acute cervical spine fracture. 3. Redemonstration of solid nodules in the left parotid gland as seen on recent CT neck, the largest approximately 11 mm on current study. Electronically Signed   By: Ashley Royalty M.D.   On: 11/30/2017 19:50   Ct Abdomen Pelvis W Contrast  Result Date: 11/30/2017 CLINICAL DATA:  Trauma, fall EXAM: CT CHEST, ABDOMEN, AND PELVIS WITH CONTRAST TECHNIQUE: Multidetector CT imaging of the chest, abdomen and pelvis was performed following the standard protocol during bolus administration of intravenous contrast. CONTRAST:  18m ISOVUE-300 IOPAMIDOL (ISOVUE-300) INJECTION 61% COMPARISON:  Radiograph 08/14/2016 FINDINGS: CT CHEST FINDINGS Cardiovascular: Moderate aortic atherosclerosis. Dense calcification at the origin of the great vessels. Ectatic ascending aorta,  measuring up to 3.9 cm. Coronary artery calcification. Partially visualized cardiac pacing leads. Dense mitral annulus calcification. Normal heart size. No pericardial effusion. Mediastinum/Nodes: No mediastinal hematoma. Midline trachea. No thyroid mass. No significant adenopathy. Esophagus within normal limits except for small distal esophageal hernia Lungs/Pleura: Mild apical emphysema. No pneumothorax. No consolidation. Musculoskeletal: Degenerative changes of the spine. No acute osseous abnormality. Old right third, fourth, fifth rib fractures and old left third, fourth, fifth, and sixth anterior rib fractures.  CT ABDOMEN PELVIS FINDINGS Hepatobiliary: No hepatic injury or perihepatic hematoma. Gallbladder is unremarkable Pancreas: Unremarkable. No pancreatic ductal dilatation or surrounding inflammatory changes. Spleen: No splenic injury or perisplenic hematoma. Adrenals/Urinary Tract: No adrenal hemorrhage or renal injury identified. Bladder is unremarkable. Moderate perinephric fat stranding Stomach/Bowel: Stomach is within normal limits. Appendix appears normal. No evidence of bowel wall thickening, distention, or inflammatory changes. Vascular/Lymphatic: Moderate aortic atherosclerosis. No aneurysmal dilatation. Heavy calcification at the origin of the celiac and SMA vessels. Densely calcified renal artery origins. No significant adenopathy. Reproductive: Prostate is unremarkable. Other: Negative for free air or free fluid. Musculoskeletal: No acute or suspicious bone lesion. Degenerative changes of the spine. IMPRESSION: 1. No CT evidence for acute thoracic abnormality. No CT evidence for acute solid organ injury within the abdomen or pelvis, negative for free air or free fluid. 2. Ectatic ascending aorta.  Moderate aortic atherosclerosis. 3. Mild apical emphysema 4. Old bilateral rib fractures Electronically Signed   By: Donavan Foil M.D.   On: 11/30/2017 19:57   Dg Knee Complete 4 Views Right  Result Date: 11/30/2017 CLINICAL DATA:  Status post fall. EXAM: RIGHT KNEE - COMPLETE 4+ VIEW COMPARISON:  07/04/2015 FINDINGS: No fracture. No subluxation or dislocation. Trace spurring in the patellofemoral compartment. No worrisome lytic or sclerotic osseous abnormality. No joint effusion. IMPRESSION: Negative. Electronically Signed   By: Misty Stanley M.D.   On: 11/30/2017 20:00        Scheduled Meds: . allopurinol  300 mg Oral Daily  . amLODipine  5 mg Oral Daily  . atorvastatin  20 mg Oral q1800  . ferrous sulfate  325 mg Oral Daily  . folic acid  1 mg Oral Daily  . levETIRAcetam  1,500 mg Oral Daily    . LORazepam  0-4 mg Oral Q6H   Followed by  . [START ON 12/03/2017] LORazepam  0-4 mg Oral Q12H  . metoprolol tartrate  50 mg Oral BID  . multivitamin with minerals  1 tablet Oral Daily  . pantoprazole  40 mg Oral BID  . tamsulosin  0.4 mg Oral QPC supper  . thiamine  100 mg Oral Daily   Or  . thiamine  100 mg Intravenous Daily   Continuous Infusions: . sodium chloride       LOS: 0 days    Time spent: [place], MD [facility] Pager [phone]  If 7PM-7AM, please contact night-coverage [email or web address] Password [email or web address] 12/02/2017, 9:22 AM

## 2017-12-03 LAB — BASIC METABOLIC PANEL
Anion gap: 10 (ref 5–15)
BUN: 13 mg/dL (ref 6–20)
CO2: 27 mmol/L (ref 22–32)
Calcium: 9 mg/dL (ref 8.9–10.3)
Chloride: 92 mmol/L — ABNORMAL LOW (ref 101–111)
Creatinine, Ser: 0.95 mg/dL (ref 0.61–1.24)
GFR calc Af Amer: >60 mL/min (ref >60)
GFR calc non Af Amer: >60 mL/min (ref >60)
Glucose, Bld: 108 mg/dL — ABNORMAL HIGH (ref 65–99)
Potassium: 3.4 mmol/L — ABNORMAL LOW (ref 3.5–5.1)
Sodium: 129 mmol/L — ABNORMAL LOW (ref 135–145)

## 2017-12-03 LAB — CBC WITH DIFFERENTIAL/PLATELET
Basophils Absolute: 0 10*3/uL (ref 0.0–0.1)
Basophils Relative: 0 %
Eosinophils Absolute: 0.2 10*3/uL (ref 0.0–0.7)
Eosinophils Relative: 3 %
HCT: 34 % — ABNORMAL LOW (ref 39.0–52.0)
Hemoglobin: 12.1 g/dL — ABNORMAL LOW (ref 13.0–17.0)
Lymphocytes Relative: 13 %
Lymphs Abs: 1 10*3/uL (ref 0.7–4.0)
MCH: 32.2 pg (ref 26.0–34.0)
MCHC: 35.6 g/dL (ref 30.0–36.0)
MCV: 90.4 fL (ref 78.0–100.0)
Monocytes Absolute: 0.6 10*3/uL (ref 0.1–1.0)
Monocytes Relative: 7 %
Neutro Abs: 6 10*3/uL (ref 1.7–7.7)
Neutrophils Relative %: 77 %
Platelets: 220 10*3/uL (ref 150–400)
RBC: 3.76 MIL/uL — ABNORMAL LOW (ref 4.22–5.81)
RDW: 13.4 % (ref 11.5–15.5)
WBC: 7.8 10*3/uL (ref 4.0–10.5)

## 2017-12-03 MED ORDER — POTASSIUM CHLORIDE CRYS ER 20 MEQ PO TBCR
40.0000 meq | EXTENDED_RELEASE_TABLET | Freq: Once | ORAL | Status: AC
  Administered 2017-12-03: 40 meq via ORAL
  Filled 2017-12-03: qty 2

## 2017-12-03 MED ORDER — FUROSEMIDE 20 MG PO TABS
20.0000 mg | ORAL_TABLET | Freq: Every day | ORAL | Status: DC
  Administered 2017-12-03 – 2017-12-05 (×3): 20 mg via ORAL
  Filled 2017-12-03 (×3): qty 1

## 2017-12-03 MED ORDER — POTASSIUM CHLORIDE ER 10 MEQ PO TBCR: 10.0000 meq | EXTENDED_RELEASE_TABLET | Freq: Every day | ORAL | Status: DC

## 2017-12-03 MED ORDER — SORBITOL 70 % SOLN
30.0000 mL | Freq: Once | Status: AC
  Administered 2017-12-03: 30 mL via ORAL
  Filled 2017-12-03: qty 30

## 2017-12-03 MED ORDER — POTASSIUM CHLORIDE ER 10 MEQ PO TBCR
10.0000 meq | EXTENDED_RELEASE_TABLET | Freq: Every day | ORAL | Status: DC
  Administered 2017-12-04 – 2017-12-05 (×2): 10 meq via ORAL
  Filled 2017-12-03 (×4): qty 1

## 2017-12-03 MED ORDER — POLYETHYLENE GLYCOL 3350 17 G PO PACK
17.0000 g | PACK | Freq: Two times a day (BID) | ORAL | Status: DC
  Administered 2017-12-03 – 2017-12-05 (×4): 17 g via ORAL
  Filled 2017-12-03 (×5): qty 1

## 2017-12-03 NOTE — Progress Notes (Signed)
PROGRESS NOTE    Francisco [name]  CLE:[id number] DOB: 11-16-1945 DOA: 11/30/2017 PCP: Orlena Sheldon, PA-C    Brief Narrative:  Patient is a 72 year old gentleman history of alcohol abuse, complete heart block status post pacemaker placement, diastolic CHF, celiac disease, hypertension presented to the ED with recurrent falls.  Patient underwent CT head neck chest abdomen with no acute abnormalities.  Lab work reviewed severe hyponatremia with a sodium of 117.   Assessment & Plan:   Principal Problem:   Acute hyponatremia Active Problems:   Generalized weakness   Celiac disease   GERD (gastroesophageal reflux disease)   Alcohol abuse, daily use   Hypertrophic obstructive cardiomyopathy (HOCM) (HCC)   Hypokalemia   Essential hypertension   Chronic diastolic congestive heart failure (HCC)   HLD (hyperlipidemia)   Complete heart block (HCC)   Seizures (HCC)   Gout   Falls   History of alcohol abuse   Hyponatremia  #1 acute severe on chronic hyponatremia ??  Etiology.  Baseline sodium levels 127-131.  Likely secondary to alcohol abuse.  Patient noted to have low blood pressure the morning of 12/01/2017, and received a 250 cc bolus of normal saline.  Sodium levels improving slowly and currently at 129 from 128 from 125 from 124 from 123 from 117 on admission.  Continue fluid restriction of 1000 cc/day.  Serial BMETS.  Follow.  2.  Alcohol abuse Currently stable.  Patient with no signs of withdrawal at this time.  Continue the Ativan withdrawal protocol.  Continue thiamine, folic acid, multivitamin.  Follow.  3.  Hypokalemia Replete.  Resume daily home dose potassium.  Magnesium level was 1.7.  Magnesium has been repleted.   4.  Hypertension Blood pressure has improved.  Continue Lopressor.  Resume home dose Lasix.  Follow.    5.  Gastroesophageal reflux disease Continue PPI.  6.  History of seizures Stable.  No seizures noted.  Continue Keppra.  7.  Falls Likely secondary  to alcohol abuse, debility and severe hyponatremia.  Fall precautions.  She has been seen by PT/OT were recommending skilled nursing facility.  Patient in agreement.  Social work consult placed.   8.  Gout Stable.  Continue allopurinol.  9.  Chronic diastolic heart failure/HOCM Currently compensated.  Blood pressure improved.  Resume home regimen of diuretics.  Continue home dose Lopressor.  Follow.   10.  History of celiac disease Per nursing patient stated he was not glucose intolerance and did not follow a gluten-free diet at home.  Patient requested regular diet and as such this has been changed.  Was changed to a regular diet.  Outpatient follow-up.    11.  Hx complete heart block status post PPM Stable.    #12 hyperlipidemia Continue statin.   DVT prophylaxis: SCDs Code Status: Full Family Communication: Updated patient.  No family at bedside. Disposition Plan: Skilled nursing facility when bed available.   Consultants:   None  Procedures:   CT head 11/30/2017  CT chest 11/30/2017  CT C-spine 11/30/2017  CT abdomen and pelvis 11/30/2017  Plain films of the right knee 11/30/2017  Antimicrobials:   None   Subjective: Patient sitting up in bed.  Denies any chest pain.  No shortness of breath.  Tolerating oral intake.  Once fluid restriction less strict.   Objective: Vitals:   12/02/17 1630 12/02/17 2131 12/03/17 0016 12/03/17 0426  BP: (!) 161/68 (!) 142/64 (!) 146/71 (!) 130/52  Pulse: 63 84 66 66  Resp: 19 18  17 18  Temp:  98.3 F (36.8 C) 97.8 F (36.6 C) 98.1 F (36.7 C)  TempSrc:  Oral Oral Oral  SpO2: 96% 98% 97% 96%  Weight:    88.5 kg (195 lb 1.7 oz)  Height:        Intake/Output Summary (Last 24 hours) at 12/03/2017 0949 Last data filed at 12/03/2017 0538 Gross per 24 hour  Intake 630 ml  Output 375 ml  Net 255 ml   Filed Weights   12/01/17 0013 12/03/17 0426  Weight: 86.7 kg (191 lb 2.2 oz) 88.5 kg (195 lb 1.7 oz)     Examination:  General exam: NAD Respiratory system: Lungs CTAB. No wheezing, no crackles, no rhonchi. Respiratory effort normal. Cardiovascular system: Regular rate and rhythm with a 7-9/1 systolic ejection murmur in the left lower sternal border.  No JVD, murmurs, rubs, gallops or clicks. No pedal edema. Gastrointestinal system: Abdomen is soft, nontender, nondistended, positive bowel sounds.  No hepatosplenomegaly.  Central nervous system: Alert and oriented. No focal neurological deficits. Extremities: Symmetric 5 x 5 power. Skin: No rashes, lesions or ulcers Psychiatry: Judgement and insight appear normal. Mood & affect appropriate.     Data Reviewed: I have personally reviewed following labs and imaging studies  CBC: Recent Labs  Lab 11/30/17 1541 12/01/17 0528 12/03/17 0512  WBC 8.4 5.7 7.8  NEUTROABS  --   --  6.0  HGB 13.7 13.0 12.1*  HCT 36.3* 35.6* 34.0*  MCV 86.6 87.5 90.4  PLT 213 217 505   Basic Metabolic Panel: Recent Labs  Lab 12/01/17 0528 12/01/17 0959 12/01/17 1456 12/02/17 0309 12/03/17 0512  NA 123* 124* 125* 128* 129*  K 3.2* 3.3* 4.3 4.0 3.4*  CL 82* 82* 86* 89* 92*  CO2 28 29 27 29 27   GLUCOSE 86 102* 104* 107* 108*  BUN 14 12 13 12 13   CREATININE 0.98 1.04 1.09 0.92 0.95  CALCIUM 8.9 9.2 9.1 9.0 9.0  MG  --  1.7  --   --   --    GFR: Estimated Creatinine Clearance: 77.3 mL/min (by C-G formula based on SCr of 0.95 mg/dL). Liver Function Tests: Recent Labs  Lab 11/30/17 1541 12/01/17 0528  AST 70* 61*  ALT 30 27  ALKPHOS 71 63  BILITOT 1.2 1.0  PROT 7.1 6.8  ALBUMIN 3.7 3.4*   No results for input(s): LIPASE, AMYLASE in the last 168 hours. No results for input(s): AMMONIA in the last 168 hours. Coagulation Profile: Recent Labs  Lab 11/30/17 1541  INR 0.92   Cardiac Enzymes: Recent Labs  Lab 11/30/17 1541 12/01/17 0528  CKTOTAL  --  390  TROPONINI <0.03 0.03*   BNP (last 3 results) No results for input(s): PROBNP  in the last 8760 hours. HbA1C: No results for input(s): HGBA1C in the last 72 hours. CBG: No results for input(s): GLUCAP in the last 168 hours. Lipid Profile: No results for input(s): CHOL, HDL, LDLCALC, TRIG, CHOLHDL, LDLDIRECT in the last 72 hours. Thyroid Function Tests: Recent Labs    11/30/17 1541  TSH 1.372   Anemia Panel: No results for input(s): VITAMINB12, FOLATE, FERRITIN, TIBC, IRON, RETICCTPCT in the last 72 hours. Sepsis Labs: No results for input(s): PROCALCITON, LATICACIDVEN in the last 168 hours.  Recent Results (from the past 240 hour(s))  Urine culture     Status: Abnormal   Collection Time: 11/30/17  3:58 PM  Result Value Ref Range Status   Specimen Description URINE, RANDOM  Final  Special Requests NONE  Final   Culture (A)  Final    10,000 COLONIES/mL GROUP B STREP(S.AGALACTIAE)ISOLATED TESTING AGAINST S. AGALACTIAE NOT ROUTINELY PERFORMED DUE TO PREDICTABILITY OF AMP/PEN/VAN SUSCEPTIBILITY. Performed at [facility], [facility] [address], [place], [place] [address]    Report Status 12/01/2017 FINAL  Final  MRSA PCR Screening     Status: None   Collection Time: 12/01/17  5:00 AM  Result Value Ref Range Status   MRSA by PCR NEGATIVE NEGATIVE Final    Comment:        The GeneXpert MRSA Assay (FDA approved for NASAL specimens only), is one component of a comprehensive MRSA colonization surveillance program. It is not intended to diagnose MRSA infection nor to guide or monitor treatment for MRSA infections.          Radiology Studies: No results found.      Scheduled Meds: . allopurinol  300 mg Oral Daily  . atorvastatin  20 mg Oral q1800  . ferrous sulfate  325 mg Oral Daily  . folic acid  1 mg Oral Daily  . levETIRAcetam  1,500 mg Oral Daily  . LORazepam  0-4 mg Oral Q12H  . metoprolol tartrate  50 mg Oral BID  . multivitamin with minerals  1 tablet Oral Daily  . pantoprazole  40 mg Oral BID  . polyethylene glycol  17 g Oral BID   . [START ON 12/04/2017] potassium chloride  10 mEq Oral Daily  . sorbitol  30 mL Oral Once  . tamsulosin  0.4 mg Oral QPC supper  . thiamine  100 mg Oral Daily   Or  . thiamine  100 mg Intravenous Daily   Continuous Infusions: . sodium chloride       LOS: 1 day    Time spent: [place], MD [facility] Pager [phone]  If 7PM-7AM, please contact night-coverage [email or web address] Password [facility] 12/03/2017, 9:49 AM

## 2017-12-03 NOTE — Progress Notes (Signed)
Physical Therapy Treatment Patient Details Name: Francisco [name] MRN: [id number] DOB: 14-May-1946 Today's Date: 12/03/2017    History of Present Illness 72 year old gentleman history of alcohol abuse, complete heart block status post pacemaker placement, diastolic CHF, celiac disease, hypertension presented to the ED with recurrent falls and admitted for severe hyponatremia likely due to alcohol abuse.    PT Comments    Patient progressing with ambulation, balance and safety.  Continues to demonstrate severe gait/balance deficits and remains high risk for falls.  Continue to recommend SNF level rehab at d/c.    Follow Up Recommendations  SNF;Supervision/Assistance - 24 hour     Equipment Recommendations  None recommended by PT    Recommendations for Other Services       Precautions / Restrictions Precautions Precautions: Fall Restrictions Weight Bearing Restrictions: No    Mobility  Bed Mobility Overal bed mobility: Needs Assistance Bed Mobility: Supine to Sit     Supine to sit: Supervision        Transfers Overall transfer level: Needs assistance Equipment used: Rolling walker (2 wheeled) Transfers: Sit to/from Stand Sit to Stand: Min assist         General transfer comment: assist for balance as posterior initially using momentum and pulling up on walker with decreased safety and impulsivity  Ambulation/Gait Ambulation/Gait assistance: Min assist Ambulation Distance (Feet): 100 Feet(x 2) Assistive device: Rolling walker (2 wheeled) Gait Pattern/deviations: Step-to pattern;Step-through pattern;Wide base of support;Ataxic     General Gait Details: shaky and ataxic throughout, though stays inside walker and keeps wide BOS for balance, mostly needs assist for safety with turns or if distracted   Stairs            Wheelchair Mobility    Modified Rankin (Stroke Patients Only)       Balance Overall balance assessment: Needs assistance;History of  Falls         Standing balance support: Bilateral upper extremity supported Standing balance-Leahy Scale: Poor Standing balance comment: UE support needed for balance                             Cognition Arousal/Alertness: Awake/alert Behavior During Therapy: WFL for tasks assessed/performed Overall Cognitive Status: Impaired/Different from baseline Area of Impairment: Safety/judgement                         Safety/Judgement: Decreased awareness of safety     General Comments: slightly impulsive with sit to stand      Exercises      General Comments General comments (skin integrity, edema, etc.): pt notes constipation, eager to get [facility] close in case, but educated needs to call for assist and kept BSC away and chair alarm in place      Pertinent Vitals/Pain Pain Assessment: No/denies pain    Home Living                      Prior Function            PT Goals (current goals can now be found in the care plan section) Progress towards PT goals: Progressing toward goals    Frequency    Min 3X/week      PT Plan Current plan remains appropriate    Co-evaluation              AM-PAC PT "6 Clicks" Daily Activity  Outcome Measure  Difficulty turning over  in bed (including adjusting bedclothes, sheets and blankets)?: None Difficulty moving from lying on back to sitting on the side of the bed? : None Difficulty sitting down on and standing up from a chair with arms (e.g., wheelchair, bedside commode, etc,.)?: Unable Help needed moving to and from a bed to chair (including a wheelchair)?: A Little Help needed walking in hospital room?: A Little Help needed climbing 3-5 steps with a railing? : A Lot 6 Click Score: 17    End of Session Equipment Utilized During Treatment: Gait belt Activity Tolerance: Patient tolerated treatment well Patient left: in chair;with call bell/phone within reach;with chair alarm set   PT Visit  Diagnosis: Repeated falls (R29.6);Difficulty in walking, not elsewhere classified (R26.2)     Time: 3448-3015 PT Time Calculation (min) (ACUTE ONLY): 14 min  Charges:  $Gait Training: 8-22 mins                    G CodesMagda Kiel, [place] [phone] 12/03/2017    Reginia Naas 12/03/2017, 12:07 PM

## 2017-12-03 NOTE — Progress Notes (Signed)
LCSW following for SNF placement.   Patient has APS referral. CSW awaiting response from APS.   LCSW will continue to follow.   Carolin Coy [place]

## 2017-12-03 NOTE — NC FL2 (Signed)
[place] LEVEL OF CARE SCREENING TOOL     IDENTIFICATION  Patient Name: Francisco [name] Birthdate: Feb 23, 1946 Sex: male Admission Date (Current Location): 11/30/2017  [facility] and [place] Number:  Herbalist and Address:  [facility],  [place], [place]      Provider Number: [id number]  Attending Physician Name and Address:  Eugenie Filler, MD  Relative Name and Phone Number:       Current Level of Care: Hospital Recommended Level of Care: [place] Prior Approval Number:    Date Approved/Denied: 12/03/17 PASRR Number: [id number]  Discharge Plan: SNF    Current Diagnoses: Patient Active Problem List   Diagnosis Date Noted  . Hyponatremia 12/02/2017  . Falls 12/01/2017  . History of alcohol abuse   . Chest pain 08/14/2016  . Seizure ([place])   . Gout 12/17/2015  . Convulsions ([place]) 08/17/2015  . Gait instability 08/17/2015  . Alcoholism /alcohol abuse ([place]) 08/17/2015  . Cardiac device in situ, other   . Elevated troponin   . Cardiac arrest ([place])   . Complete heart block ([place])   . Seizures ([place])   . Chronic diastolic congestive heart failure ([place])   . HLD (hyperlipidemia)   . Alcohol withdrawal ([place])   . Acute renal failure syndrome ([place])   . Hypokalemia   . Hypomagnesemia   . Absolute anemia   . Hypertrophic cardiomyopathy ([place])   . Diastolic CHF, acute on chronic (HCC)   . Essential hypertension   . Alcohol withdrawal seizure ([place]) 06/29/2015  . Hypertrophic obstructive cardiomyopathy (HOCM) ([place]) 06/29/2015  . Mild diastolic dysfunction 90/24/0973  . Acute pain of right knee 06/29/2015  . Transaminitis 06/29/2015  . Acute renal failure ([place]) 06/29/2015  . High anion gap metabolic acidosis 53/29/9242  . Acute hypokalemia 06/29/2015  . Alcohol abuse, daily use 06/10/2015  . Hyperglycemia 06/09/2015  . Vitamin D deficiency 06/09/2015  . Hyperlipidemia 06/09/2015  . Bilateral  lower extremity edema (chronic) 06/09/2015  . BPH (benign prostatic hypertrophy) 08/13/2014  . Multiple duodenal ulcers 07/03/2012  . Chest pain on exertion 07/02/2012  . GIB (gastrointestinal bleeding) 07/02/2012  . GERD (gastroesophageal reflux disease)   . Heart murmur   . Microcytic anemia 06/14/2012  . Generalized weakness 06/13/2012  . Celiac disease 06/13/2012  . HTN (hypertension) 06/13/2012  . Acute hyponatremia 06/13/2012    Orientation RESPIRATION BLADDER Height & Weight     Self, Time, Situation, Place  Normal Continent Weight: 195 lb 1.7 oz (88.5 kg) Height:  5' 9"  (175.3 cm)  BEHAVIORAL SYMPTOMS/MOOD NEUROLOGICAL BOWEL NUTRITION STATUS      Continent Diet(See dc summary)  AMBULATORY STATUS COMMUNICATION OF NEEDS Skin   Extensive Assist Verbally Skin abrasions, Bruising(Knees and buttocks )                       Personal Care Assistance Level of Assistance  Bathing, Feeding, Dressing Bathing Assistance: Limited assistance Feeding assistance: Independent Dressing Assistance: Limited assistance     Functional Limitations Info  Sight, Hearing, Speech Sight Info: Adequate Hearing Info: Adequate Speech Info: Impaired(speech slurred)    SPECIAL CARE FACTORS FREQUENCY  PT (By licensed PT), OT (By licensed OT)     PT Frequency: 5x/week OT Frequency: 5x/week            Contractures Contractures Info: Not present    Additional Factors Info  Code Status, Allergies Code Status Info: Full Allergies Info: Gluten Meal  Current Medications (12/03/2017):  This is the current hospital active medication list Current Facility-Administered Medications  Medication Dose Route Frequency Provider Last Rate Last Dose  . acetaminophen (TYLENOL) tablet 650 mg  650 mg Oral Q6H PRN Rise Patience, MD       Or  . acetaminophen (TYLENOL) suppository 650 mg  650 mg Rectal Q6H PRN Rise Patience, MD      . allopurinol (ZYLOPRIM) tablet 300 mg  300  mg Oral Daily Rise Patience, MD   300 mg at 12/03/17 0905  . atorvastatin (LIPITOR) tablet 20 mg  20 mg Oral q1800 Rise Patience, MD   20 mg at 12/02/17 1737  . ferrous sulfate tablet 325 mg  325 mg Oral Daily Rise Patience, MD   325 mg at 12/03/17 0905  . folic acid (FOLVITE) tablet 1 mg  1 mg Oral Daily Rise Patience, MD   1 mg at 12/03/17 0904  . hydrALAZINE (APRESOLINE) injection 10 mg  10 mg Intravenous Q4H PRN Rise Patience, MD      . levETIRAcetam (KEPPRA XR) 24 hr tablet 1,500 mg  1,500 mg Oral Daily Rise Patience, MD   1,500 mg at 12/03/17 0905  . LORazepam (ATIVAN) tablet 1 mg  1 mg Oral Q6H PRN Rise Patience, MD       Or  . LORazepam (ATIVAN) injection 1 mg  1 mg Intravenous Q6H PRN Rise Patience, MD      . LORazepam (ATIVAN) tablet 0-4 mg  0-4 mg Oral Q12H Rise Patience, MD      . metoprolol tartrate (LOPRESSOR) tablet 50 mg  50 mg Oral BID Rise Patience, MD   50 mg at 12/03/17 0905  . multivitamin with minerals tablet 1 tablet  1 tablet Oral Daily Rise Patience, MD   1 tablet at 12/03/17 0905  . ondansetron (ZOFRAN) tablet 4 mg  4 mg Oral Q6H PRN Rise Patience, MD       Or  . ondansetron [facility]) injection 4 mg  4 mg Intravenous Q6H PRN Rise Patience, MD      . pantoprazole (PROTONIX) EC tablet 40 mg  40 mg Oral BID Rise Patience, MD   40 mg at 12/03/17 0904  . polyethylene glycol (MIRALAX / GLYCOLAX) packet 17 g  17 g Oral BID Eugenie Filler, MD      . Derrill Memo ON 12/04/2017] potassium chloride (K-DUR) CR tablet 10 mEq  10 mEq Oral Daily Eugenie Filler, MD      . sodium chloride 0.9 % bolus 250 mL  250 mL Intravenous Once Rise Patience, MD      . sorbitol 70 % solution 30 mL  30 mL Oral Once Eugenie Filler, MD      . tamsulosin [facility]) capsule 0.4 mg  0.4 mg Oral QPC supper Rise Patience, MD   0.4 mg at 12/02/17 1737  . thiamine (VITAMIN B-1) tablet 100 mg  100  mg Oral Daily Rise Patience, MD   100 mg at 12/03/17 5329   Or  . thiamine (B-1) injection 100 mg  100 mg Intravenous Daily Rise Patience, MD         Discharge Medications: Please see discharge summary for a list of discharge medications.  Relevant Imaging Results:  Relevant Lab Results:   Additional Information ssn: [id number]  Servando Snare, LCSW
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
## 2017-12-03 NOTE — Evaluation (Signed)
Occupational Therapy Evaluation Patient Details Name: Francisco [name] MRN: [id number] DOB: 12-28-1945 Today's Date: 12/03/2017    History of Present Illness 72 year old gentleman history of alcohol abuse, complete heart block status post pacemaker placement, diastolic CHF, celiac disease, hypertension presented to the ED with recurrent falls and admitted for severe hyponatremia likely due to alcohol abuse.   Clinical Impression   This 72 yo male admitted with above presents to acute OT with decreased balance when up on his feet and decreased safety awareness with use of RW both affecting his ability to care for himself alone at home. He will benefit from acute OT with follow up OT at SNF.    Follow Up Recommendations  SNF    Equipment Recommendations  Other (comment)(TBD at next venue)       Precautions / Restrictions Precautions Precautions: Fall Restrictions Weight Bearing Restrictions: No      Mobility Bed Mobility Overal bed mobility: Needs Assistance Bed Mobility: Supine to Sit     Supine to sit: Supervision        Transfers Overall transfer level: Needs assistance Equipment used: Rolling walker (2 wheeled) Transfers: Sit to/from Stand Sit to Stand: Min assist         General transfer comment: assist for balance as posterior initially using momentum and pulling up on walker with decreased safety and impulsivity    Balance Overall balance assessment: Needs assistance;History of Falls Sitting-balance support: No upper extremity supported;Feet supported Sitting balance-Leahy Scale: Good     Standing balance support: Bilateral upper extremity supported Standing balance-Leahy Scale: Poor Standing balance comment: UE support needed for balance                            ADL either performed or assessed with clinical judgement   ADL Overall ADL's : Needs assistance/impaired Eating/Feeding: Independent;Sitting   Grooming: Set up;Sitting    Upper Body Bathing: Set up;Sitting   Lower Body Bathing: Minimal assistance;Sit to/from stand   Upper Body Dressing : Set up;Sitting   Lower Body Dressing: Minimal assistance;Sit to/from stand   Toilet Transfer: Minimal assistance;Ambulation;Grab bars   Toileting- Clothing Manipulation and Hygiene: Minimal assistance;Sit to/from stand               Vision Patient Visual Report: No change from baseline              Pertinent Vitals/Pain Pain Assessment: No/denies pain     Hand Dominance  right   Extremity/Trunk Assessment Upper Extremity Assessment Upper Extremity Assessment: Overall WFL for tasks assessed           Communication  no issues   Cognition Arousal/Alertness: Awake/alert Behavior During Therapy: WFL for tasks assessed/performed Overall Cognitive Status: No family/caregiver present to determine baseline cognitive functioning Area of Impairment: Safety/judgement                         Safety/Judgement: Decreased awareness of safety     General Comments: slightly impulsive with sit to stand   General Comments  pt notes constipation, eager to get [facility] close in case, but educated needs to call for assist and kept BSC away and chair alarm in place            Home Living Family/patient expects to be discharged to:: Skilled nursing facility Living Arrangements: Alone   Type of Home: House Home Access: Stairs to enter [employer] of Steps: 2  Home Layout: Able to live on main level with bedroom/bathroom               Home Equipment: Walker - 2 wheels                   OT Problem List: Impaired balance (sitting and/or standing);Decreased coordination      OT Treatment/Interventions: Self-care/ADL training;Balance training;Patient/family education;DME and/or AE instruction    OT Goals(Current goals can be found in the care plan section) Acute Rehab OT Goals Patient Stated Goal: to be able to rest OT  Goal Formulation: With patient Time For Goal Achievement: 12/17/17 Potential to Achieve Goals: Good  OT Frequency: Min 2X/week   Barriers to D/C: Decreased caregiver support             AM-PAC PT "6 Clicks" Daily Activity     Outcome Measure Help from another person eating meals?: None Help from another person taking care of personal grooming?: A Little Help from another person toileting, which includes using toliet, bedpan, or urinal?: A Little Help from another person bathing (including washing, rinsing, drying)?: A Little Help from another person to put on and taking off regular upper body clothing?: A Little Help from another person to put on and taking off regular lower body clothing?: A Little 6 Click Score: 19   End of Session Equipment Utilized During Treatment: Gait belt;Rolling walker Nurse Communication: Mobility status(NT: can ambulate to bathroom with RW with gait belt)  Activity Tolerance: Patient tolerated treatment well Patient left: in bed;with call bell/phone within reach;with bed alarm set  OT Visit Diagnosis: Unsteadiness on feet (R26.81);Other abnormalities of gait and mobility (R26.89);History of falling (Z91.81)                Time: 1610-9604 OT Time Calculation (min): 12 min Charges:  OT General Charges $OT Visit: 1 Visit OT Evaluation $OT Eval Moderate Complexity: [address], [place] [phone] 12/03/2017

## 2017-12-04 ENCOUNTER — Telehealth: Payer: Self-pay

## 2017-12-04 LAB — BASIC METABOLIC PANEL
Anion gap: 7 (ref 5–15)
BUN: 12 mg/dL (ref 6–20)
CO2: 29 mmol/L (ref 22–32)
Calcium: 8.9 mg/dL (ref 8.9–10.3)
Chloride: 93 mmol/L — ABNORMAL LOW (ref 101–111)
Creatinine, Ser: 0.95 mg/dL (ref 0.61–1.24)
GFR calc Af Amer: >60 mL/min (ref >60)
GFR calc non Af Amer: >60 mL/min (ref >60)
Glucose, Bld: 98 mg/dL (ref 65–99)
Potassium: 3.5 mmol/L (ref 3.5–5.1)
Sodium: 129 mmol/L — ABNORMAL LOW (ref 135–145)

## 2017-12-04 MED ORDER — POLYETHYLENE GLYCOL 3350 17 G PO PACK: 17.0000 g | PACK | Freq: Two times a day (BID) | ORAL | 0 refills | Status: DC

## 2017-12-04 MED ORDER — POTASSIUM CHLORIDE CRYS ER 20 MEQ PO TBCR
40.0000 meq | EXTENDED_RELEASE_TABLET | Freq: Once | ORAL | Status: AC
  Administered 2017-12-04: 40 meq via ORAL
  Filled 2017-12-04: qty 2

## 2017-12-04 MED ORDER — FERROUS SULFATE 325 (65 FE) MG PO TABS: 325.0000 mg | ORAL_TABLET | Freq: Every day | ORAL | Status: DC

## 2017-12-04 MED ORDER — FOLIC ACID 1 MG PO TABS: 1.0000 mg | ORAL_TABLET | Freq: Every day | ORAL | Status: DC

## 2017-12-04 MED ORDER — THIAMINE HCL 100 MG PO TABS: 100.0000 mg | ORAL_TABLET | Freq: Every day | ORAL | Status: DC

## 2017-12-04 MED ORDER — ADULT MULTIVITAMIN W/MINERALS CH: 1.0000 | ORAL_TABLET | Freq: Every day | ORAL | Status: DC

## 2017-12-04 MED ORDER — COLCHICINE 0.6 MG PO TABS: 0.6000 mg | ORAL_TABLET | Freq: Two times a day (BID) | ORAL | 0 refills | Status: AC | PRN

## 2017-12-04 NOTE — Progress Notes (Signed)
PROGRESS NOTE    Francisco [name]  VOH:[id number] DOB: 25-Oct-1946 DOA: 11/30/2017 PCP: Orlena Sheldon, PA-C    Brief Narrative:  Patient is a 72 year old gentleman history of alcohol abuse, complete heart block status post pacemaker placement, diastolic CHF, celiac disease, hypertension presented to the ED with recurrent falls.  Patient underwent CT head neck chest abdomen with no acute abnormalities.  Lab work reviewed severe hyponatremia with a sodium of 117.   Assessment & Plan:   Principal Problem:   Acute hyponatremia Active Problems:   Generalized weakness   Celiac disease   GERD (gastroesophageal reflux disease)   Alcohol abuse, daily use   Hypertrophic obstructive cardiomyopathy (HOCM) (HCC)   Hypokalemia   Essential hypertension   Chronic diastolic congestive heart failure (HCC)   HLD (hyperlipidemia)   Complete heart block (HCC)   Seizures (HCC)   Gout   Falls   History of alcohol abuse   Hyponatremia  #1 acute severe on chronic hyponatremia ??  Etiology.  Baseline sodium levels 127-131.  Likely secondary to alcohol abuse.  Patient noted to have low blood pressure the morning of 12/01/2017, and received a 250 cc bolus of normal saline.  Sodium levels improving slowly on a daily basis and currently at 129 from 128 from 125 from 124 from 123 from 117 on admission.  Continue fluid restriction of 1000 cc/day.  Follow.  2.  Alcohol abuse Currently stable.  Patient with no signs of withdrawal at this time.  Continue the Ativan withdrawal protocol.  Continue thiamine, folic acid, multivitamin.  Follow.  3.  Hypokalemia Resumed daily home dose potassium.  Magnesium level was 1.7.  Magnesium has been repleted.   4.  Hypertension Blood pressure has improved.  Systolic blood pressures now in the 130s.  Continue Lopressor and Lasix.  ACE inhibitor and Norvasc on hold as blood pressure currently stable. Follow.    5.  Gastroesophageal reflux disease Continue PPI.  6.   History of seizures No seizures noted during his hospitalization. Continue Keppra.  7.  Falls Likely secondary to alcohol abuse, debility and severe hyponatremia and possibly hypotension.  Currently only on Lasix and Lopressor, ACE inhibitor and Norvasc on hold and blood pressure currently stable in the 130s.  Fall precautions. Patient has been seen by PT/OT who is recommending skilled nursing facility.  Patient in agreement.  Awaiting SNIF placement.    8.  Gout Stable.  Continue allopurinol.  9.  Chronic diastolic heart failure/HOCM Currently compensated.  Blood pressure improved.  Continue Lasix and Lopressor.  ACE inhibitor and Norvasc on hold as blood pressure currently stable. Follow.   10.  History of celiac disease Per nursing patient stated he was not glutein intolerant and did not follow a gluten-free diet at home.  Patient tolerating regular diet.  Follow.   11.  Hx complete heart block status post PPM Stable.    #12 hyperlipidemia Continue statin.   DVT prophylaxis: SCDs Code Status: Full Family Communication: Updated patient.  No family at bedside. Disposition Plan: Skilled nursing facility when bed available.   Consultants:   None  Procedures:   CT head 11/30/2017  CT chest 11/30/2017  CT C-spine 11/30/2017  CT abdomen and pelvis 11/30/2017  Plain films of the right knee 11/30/2017  Antimicrobials:   None   Subjective: Patient sitting up in bed.  No shortness of breath.  No chest pain.  Tolerating current oral intake.  Feeling better every day.   Objective: Vitals:   12/03/17  0426 12/03/17 1333 12/03/17 2041 12/04/17 0506  BP: (!) 130/52 (!) 162/75 (!) 147/86 136/65  Pulse: 66 66 95 (!) 59  Resp: 18 20 20 20   Temp: 98.1 F (36.7 C) 98.4 F (36.9 C) 98.2 F (36.8 C) 97.8 F (36.6 C)  TempSrc: Oral Oral Oral Oral  SpO2: 96% 100% 96% 99%  Weight: 88.5 kg (195 lb 1.7 oz)   84.8 kg (186 lb 15.2 oz)  Height:        Intake/Output Summary (Last 24  hours) at 12/04/2017 1045 Last data filed at 12/04/2017 0849 Gross per 24 hour  Intake 600 ml  Output 250 ml  Net 350 ml   Filed Weights   12/01/17 0013 12/03/17 0426 12/04/17 0506  Weight: 86.7 kg (191 lb 2.2 oz) 88.5 kg (195 lb 1.7 oz) 84.8 kg (186 lb 15.2 oz)    Examination:  General exam: NAD Respiratory system: Lungs clear to auscultation bilaterally no wheezes, no crackles, no rhonchi. Respiratory effort normal. Cardiovascular system: Regular rate and rhythm with a 5-6/9 systolic ejection murmur in the left lower sternal border.  No JVD, murmurs, rubs, gallops or clicks. No pedal edema. Gastrointestinal system: Abdomen is nontender, nondistended, soft, positive bowel sounds.  No hepatosplenomegaly.  Central nervous system: Alert and oriented. No focal neurological deficits. Extremities: Symmetric 5 x 5 power. Skin: No rashes, lesions or ulcers Psychiatry: Judgement and insight appear normal. Mood & affect appropriate.     Data Reviewed: I have personally reviewed following labs and imaging studies  CBC: Recent Labs  Lab 11/30/17 1541 12/01/17 0528 12/03/17 0512  WBC 8.4 5.7 7.8  NEUTROABS  --   --  6.0  HGB 13.7 13.0 12.1*  HCT 36.3* 35.6* 34.0*  MCV 86.6 87.5 90.4  PLT 213 217 794   Basic Metabolic Panel: Recent Labs  Lab 12/01/17 0959 12/01/17 1456 12/02/17 0309 12/03/17 0512 12/04/17 0605  NA 124* 125* 128* 129* 129*  K 3.3* 4.3 4.0 3.4* 3.5  CL 82* 86* 89* 92* 93*  CO2 29 27 29 27 29   GLUCOSE 102* 104* 107* 108* 98  BUN 12 13 12 13 12   CREATININE 1.04 1.09 0.92 0.95 0.95  CALCIUM 9.2 9.1 9.0 9.0 8.9  MG 1.7  --   --   --   --    GFR: Estimated Creatinine Clearance: 70.3 mL/min (by C-G formula based on SCr of 0.95 mg/dL). Liver Function Tests: Recent Labs  Lab 11/30/17 1541 12/01/17 0528  AST 70* 61*  ALT 30 27  ALKPHOS 71 63  BILITOT 1.2 1.0  PROT 7.1 6.8  ALBUMIN 3.7 3.4*   No results for input(s): LIPASE, AMYLASE in the last 168  hours. No results for input(s): AMMONIA in the last 168 hours. Coagulation Profile: Recent Labs  Lab 11/30/17 1541  INR 0.92   Cardiac Enzymes: Recent Labs  Lab 11/30/17 1541 12/01/17 0528  CKTOTAL  --  390  TROPONINI <0.03 0.03*   BNP (last 3 results) No results for input(s): PROBNP in the last 8760 hours. HbA1C: No results for input(s): HGBA1C in the last 72 hours. CBG: No results for input(s): GLUCAP in the last 168 hours. Lipid Profile: No results for input(s): CHOL, HDL, LDLCALC, TRIG, CHOLHDL, LDLDIRECT in the last 72 hours. Thyroid Function Tests: No results for input(s): TSH, T4TOTAL, FREET4, T3FREE, THYROIDAB in the last 72 hours. Anemia Panel: No results for input(s): VITAMINB12, FOLATE, FERRITIN, TIBC, IRON, RETICCTPCT in the last 72 hours. Sepsis Labs: No results for  input(s): PROCALCITON, LATICACIDVEN in the last 168 hours.  Recent Results (from the past 240 hour(s))  Urine culture     Status: Abnormal   Collection Time: 11/30/17  3:58 PM  Result Value Ref Range Status   Specimen Description URINE, RANDOM  Final   Special Requests NONE  Final   Culture (A)  Final    10,000 COLONIES/mL GROUP B STREP(S.AGALACTIAE)ISOLATED TESTING AGAINST S. AGALACTIAE NOT ROUTINELY PERFORMED DUE TO PREDICTABILITY OF AMP/PEN/VAN SUSCEPTIBILITY. Performed at [facility], [place] [address] [place], [place] [address]    Report Status 12/01/2017 FINAL  Final  MRSA PCR Screening     Status: None   Collection Time: 12/01/17  5:00 AM  Result Value Ref Range Status   MRSA by PCR NEGATIVE NEGATIVE Final    Comment:        The GeneXpert MRSA Assay (FDA approved for NASAL specimens only), is one component of a comprehensive MRSA colonization surveillance program. It is not intended to diagnose MRSA infection nor to guide or monitor treatment for MRSA infections.          Radiology Studies: No results found.      Scheduled Meds: . allopurinol  300 mg Oral  Daily  . atorvastatin  20 mg Oral q1800  . ferrous sulfate  325 mg Oral Daily  . folic acid  1 mg Oral Daily  . furosemide  20 mg Oral Daily  . levETIRAcetam  1,500 mg Oral Daily  . LORazepam  0-4 mg Oral Q12H  . metoprolol tartrate  50 mg Oral BID  . multivitamin with minerals  1 tablet Oral Daily  . pantoprazole  40 mg Oral BID  . polyethylene glycol  17 g Oral BID  . potassium chloride  10 mEq Oral Daily  . tamsulosin  0.4 mg Oral QPC supper  . thiamine  100 mg Oral Daily   Or  . thiamine  100 mg Intravenous Daily   Continuous Infusions: . sodium chloride       LOS: 2 days    Time spent: [place], MD [facility] Pager [phone]  If 7PM-7AM, please contact night-coverage [email or web address] Password [email or web address] 12/04/2017, 10:45 AM

## 2017-12-04 NOTE — Care Management Note (Signed)
Case Management Note  Patient Details  Name: LAUREANO HETZER MRN: [id number] Date of Birth: 1945/12/04  Subjective/Objective:                    Action/Plan: Will follow for cm needs none present at time of review.  Expected Discharge Date:                  Expected Discharge Plan:  Home/Self Care  In-House Referral:     Discharge planning Services  CM Consult  Post Acute Care Choice:    Choice offered to:     DME Arranged:    DME Agency:     HH Arranged:    HH Agency:     Status of Service:  In process, will continue to follow  If discussed at Long Length of Stay Meetings, dates discussed:    Additional Comments:  Leeroy Cha, RN 12/04/2017, 10:00 AM

## 2017-12-04 NOTE — Telephone Encounter (Signed)
Francisco [name] with [facility] called requesting another number to get in touch with patient. Several calls had been placed to patient and she was unable to leave message for patient.   Francisco [name] was informed that patient was admitted to ED on 12/02/2017. Francisco [name] stated she would leave the referral open until 01/15/2018 and try the patient at a later date.

## 2017-12-05 DIAGNOSIS — E871 Hypo-osmolality and hyponatremia: Principal | ICD-10-CM

## 2017-12-05 DIAGNOSIS — R093 Abnormal sputum: Secondary | ICD-10-CM | POA: Diagnosis not present

## 2017-12-05 DIAGNOSIS — R262 Difficulty in walking, not elsewhere classified: Secondary | ICD-10-CM | POA: Diagnosis not present

## 2017-12-05 DIAGNOSIS — J80 Acute respiratory distress syndrome: Secondary | ICD-10-CM | POA: Diagnosis not present

## 2017-12-05 DIAGNOSIS — Z9181 History of falling: Secondary | ICD-10-CM | POA: Diagnosis not present

## 2017-12-05 DIAGNOSIS — R0981 Nasal congestion: Secondary | ICD-10-CM | POA: Diagnosis not present

## 2017-12-05 DIAGNOSIS — R05 Cough: Secondary | ICD-10-CM | POA: Diagnosis not present

## 2017-12-05 DIAGNOSIS — J069 Acute upper respiratory infection, unspecified: Secondary | ICD-10-CM | POA: Diagnosis not present

## 2017-12-05 DIAGNOSIS — K219 Gastro-esophageal reflux disease without esophagitis: Secondary | ICD-10-CM | POA: Diagnosis not present

## 2017-12-05 DIAGNOSIS — I5032 Chronic diastolic (congestive) heart failure: Secondary | ICD-10-CM | POA: Diagnosis not present

## 2017-12-05 DIAGNOSIS — M6281 Muscle weakness (generalized): Secondary | ICD-10-CM | POA: Diagnosis not present

## 2017-12-05 DIAGNOSIS — E876 Hypokalemia: Secondary | ICD-10-CM | POA: Diagnosis not present

## 2017-12-05 DIAGNOSIS — F1010 Alcohol abuse, uncomplicated: Secondary | ICD-10-CM

## 2017-12-05 DIAGNOSIS — I10 Essential (primary) hypertension: Secondary | ICD-10-CM | POA: Diagnosis not present

## 2017-12-05 DIAGNOSIS — K900 Celiac disease: Secondary | ICD-10-CM | POA: Diagnosis not present

## 2017-12-05 DIAGNOSIS — I421 Obstructive hypertrophic cardiomyopathy: Secondary | ICD-10-CM | POA: Diagnosis not present

## 2017-12-05 DIAGNOSIS — M2550 Pain in unspecified joint: Secondary | ICD-10-CM | POA: Diagnosis not present

## 2017-12-05 NOTE — Progress Notes (Signed)
Occupational Therapy Treatment Patient Details Name: Francisco [name] MRN: [id number] DOB: 1946-10-25 Today's Date: 12/05/2017    History of present illness 72 year old gentleman history of alcohol abuse, complete heart block status post pacemaker placement, diastolic CHF, celiac disease, hypertension presented to the ED with recurrent falls and admitted for severe hyponatremia likely due to alcohol abuse.   OT comments    Follow Up Recommendations  SNF    Equipment Recommendations  Other (comment)(TBD at next venue)    Recommendations for Other Services      Precautions / Restrictions Precautions Precautions: Fall       Mobility Bed Mobility   Bed Mobility: Supine to Sit     Supine to sit: Modified independent (Device/Increase time)     General bed mobility comments: pt sitting EOB  Transfers Overall transfer level: Needs assistance Equipment used: Rolling walker (2 wheeled) Transfers: Sit to/from [facility] Sit to Stand: Min assist Stand pivot transfers: Min assist       General transfer comment: VC for safety    Balance Overall balance assessment: Needs assistance;History of Falls Sitting-balance support: No upper extremity supported;Feet supported Sitting balance-Leahy Scale: Good Sitting balance - Comments: able to adjust socks sitting EOB   Standing balance support: Bilateral upper extremity supported Standing balance-Leahy Scale: Fair Standing balance comment: UE support needed for balance              High level balance activites: Side stepping;Backward walking;Sudden stops High Level Balance Comments: at wall rail in hallway practiced balance activities, but pt self limiting prefers to walk back to room when I challange him           ADL either performed or assessed with clinical judgement   ADL Overall ADL's : Needs assistance/impaired         Upper Body Bathing: Set up;Sitting       Upper Body Dressing : Set  up;Sitting   Lower Body Dressing: Minimal assistance;Sit to/from stand Lower Body Dressing Details (indicate cue type and reason): A with shoes only  Toilet Transfer: Minimal assistance;Ambulation;RW;Comfort height toilet   Toileting- Clothing Manipulation and Hygiene: Minimal assistance;Sit to/from stand;Cueing for sequencing;Cueing for safety         General ADL Comments: Educated pt on safety and use of walker.  Pt walked with walker too far in front of pt.                 Cognition Arousal/Alertness: Awake/alert Behavior During Therapy: Impulsive Overall Cognitive Status: No family/caregiver present to determine baseline cognitive functioning Area of Impairment: Safety/judgement                         Safety/Judgement: Decreased awareness of safety     General Comments: slightly impulsive with sit to stand                   Pertinent Vitals/ Pain       Pain Assessment: No/denies pain     Prior Functioning/Environment              Frequency  Min 2X/week        Progress Toward Goals  OT Goals(current goals can now be found in the care plan section)  Progress towards OT goals: Progressing toward goals     Plan Discharge plan remains appropriate       AM-PAC PT "6 Clicks" Daily Activity     Outcome Measure   Help from another  person eating meals?: None Help from another person taking care of personal grooming?: A Little Help from another person toileting, which includes using toliet, bedpan, or urinal?: A Little Help from another person bathing (including washing, rinsing, drying)?: A Little Help from another person to put on and taking off regular upper body clothing?: A Little Help from another person to put on and taking off regular lower body clothing?: A Little 6 Click Score: 19    End of Session Equipment Utilized During Treatment: Gait belt;Rolling walker  OT Visit Diagnosis: Unsteadiness on feet (R26.81);Other  abnormalities of gait and mobility (R26.89);History of falling (Z91.81)   Activity Tolerance Patient tolerated treatment well   Patient Left with call bell/phone within reach;in chair;with chair alarm set;with family/visitor present   Nurse Communication Mobility status(NT: can ambulate to bathroom with RW with gait belt)        Time: 1443-1459 OT Time Calculation (min): 16 min  Charges: OT Treatments $Self Care/Home Management : 8-22 mins  Kari Baars, [place]   Payton Mccallum D 12/05/2017, 3:18 PM

## 2017-12-05 NOTE — Care Management Important Message (Signed)
Important Message  Patient Details  Name: Francisco [name] MRN: [id number] Date of Birth: [birth year]   Medicare Important Message Given:  Yes    Kerin Salen 12/05/2017, 10:37 AMImportant Message  Patient Details  Name: Francisco [name] MRN: [id number] Date of Birth: January 31, 1946   Medicare Important Message Given:  Yes    Kerin Salen 12/05/2017, 10:37 AM

## 2017-12-05 NOTE — Progress Notes (Signed)
Physical Therapy Treatment Patient Details Name: Francisco [name] MRN: [id number] DOB: Nov 10, 1945 Today's Date: 12/05/2017    History of Present Illness 72 year old gentleman history of alcohol abuse, complete heart block status post pacemaker placement, diastolic CHF, celiac disease, hypertension presented to the ED with recurrent falls and admitted for severe hyponatremia likely due to alcohol abuse.    PT Comments    Patient progressing some with balance, though self limiting when challenged.  Continues to need  SNF level rehab at d/c.  PT to follow.   Follow Up Recommendations  SNF;Supervision/Assistance - 24 hour     Equipment Recommendations  None recommended by PT    Recommendations for Other Services       Precautions / Restrictions Precautions Precautions: Fall    Mobility  Bed Mobility   Bed Mobility: Supine to Sit     Supine to sit: Modified independent (Device/Increase time)        Transfers Overall transfer level: Needs assistance Equipment used: Rolling walker (2 wheeled);None Transfers: Sit to/from Stand Sit to Stand: Min guard         General transfer comment: cues for hand placement, assist for anterior weight shift when up on feet to prevent bracing with legs on side of bed  Ambulation/Gait Ambulation/Gait assistance: Min assist;Supervision Ambulation Distance (Feet): 100 Feet(x 2) Assistive device: Rolling walker (2 wheeled);None Gait Pattern/deviations: Step-through pattern;Decreased stride length;Wide base of support;Ataxic     General Gait Details: shorter step length when attempting without walker (reports walking across the room without device on his own in the room & educated to call for assist)  After ambulation with walker, attempted no device and pt able to perform for about 60' cues for stride length as shorter and shorter steps, then more unsteady to returned to using walker   Stairs            Wheelchair Mobility     Modified Rankin (Stroke Patients Only)       Balance Overall balance assessment: Needs assistance;History of Falls Sitting-balance support: No upper extremity supported;Feet supported Sitting balance-Leahy Scale: Good Sitting balance - Comments: able to adjust socks sitting EOB   Standing balance support: Bilateral upper extremity supported Standing balance-Leahy Scale: Fair Standing balance comment: static balance no UE support with S; but dynamic component needs assist or UE support             High level balance activites: Side stepping;Backward walking;Sudden stops High Level Balance Comments: at wall rail in hallway practiced balance activities, but pt self limiting prefers to walk back to room when I challange him            Cognition Arousal/Alertness: Awake/alert Behavior During Therapy: WFL for tasks assessed/performed Overall Cognitive Status: No family/caregiver present to determine baseline cognitive functioning Area of Impairment: Safety/judgement                         Safety/Judgement: Decreased awareness of safety     General Comments: slightly impulsive with sit to stand      Exercises      General Comments        Pertinent Vitals/Pain Pain Assessment: No/denies pain    Home Living                      Prior Function            PT Goals (current goals can now be found in the care plan  section) Progress towards PT goals: Progressing toward goals    Frequency    Min 3X/week      PT Plan Current plan remains appropriate    Co-evaluation              AM-PAC PT "6 Clicks" Daily Activity  Outcome Measure  Difficulty turning over in bed (including adjusting bedclothes, sheets and blankets)?: None Difficulty moving from lying on back to sitting on the side of the bed? : None Difficulty sitting down on and standing up from a chair with arms (e.g., wheelchair, bedside commode, etc,.)?: Unable Help needed  moving to and from a bed to chair (including a wheelchair)?: A Little Help needed walking in hospital room?: A Little Help needed climbing 3-5 steps with a railing? : A Lot 6 Click Score: 17    End of Session Equipment Utilized During Treatment: Gait belt Activity Tolerance: Patient tolerated treatment well Patient left: in chair;with call bell/phone within reach;with chair alarm set Nurse Communication: Mobility status PT Visit Diagnosis: Repeated falls (R29.6);Difficulty in walking, not elsewhere classified (R26.2)     Time: 9144-4584 PT Time Calculation (min) (ACUTE ONLY): 21 min  Charges:  $Gait Training: 8-22 mins                    G CodesMagda Kiel, [place] [phone] 12/05/2017    Reginia Naas 12/05/2017, 1:29 PM

## 2017-12-05 NOTE — Discharge Summary (Signed)
PATIENT DETAILS Name: Francisco [name] Age: 72 y.o. Sex: male Date of Birth: 07-21-1946 MRN: [id number]. Admitting Physician: Eugenie Filler, MD WUJ:[id number], Lonie Peak, PA-C  Admit Date: 11/30/2017 Discharge date: 12/05/2017  Recommendations for Outpatient Follow-up:  1. Follow up with PCP in 1-2 weeks 2. Please obtain BMP/CBC in one week 3. Has known history of left parotid gland nodule-consider outpatient ENT evaluation  Admitted From:  Home  Disposition:  SNF   Home Health: No  Equipment/Devices: None  Discharge Condition: Stable  CODE STATUS: FULL CODE  Diet recommendation:  Heart Healthy   Brief Summary: See H&P, Labs, Consult and Test reports for all details in brief, patient is a 72 year old male with long-standing history of alcohol abuse who workup revealed severe hyponatremia.  He was subsequently admitted to the hospitalist service for further evaluation and treatment.    Brief Hospital Course: Severe hyponatremia: Appears to have chronic hyponatremia at baseline-suspect this is secondary to beer potomania.  Was managed with gentle hydration initially and then placed on fluid restriction with significant improvement of his sodium levels.  Per chart review, patient has chronic hyponatremia at baseline-his current sodium level of 129 is close to his baseline.   Alcohol abuse: Drinks numerous bottles of beer on a daily basis-no withdrawal symptoms at present.  Continue thiamine, folic acid and multivitamin.  Hypokalemia: Resolved after repletion.  Follow periodically.  Likely secondary to alcohol use  Gout: Continue allopurinol-no evidence of gouty flare  History of seizures: No seizures noted during this hospitalization-continue Keppra  Falls: Multifactorial due to alcohol abuse, debility, hyponatremia-  Chronic diastolic heart failure/HOCM: Compensated-continue Lasix and Lopressor.  Left parotid gland nodule: Stable for outpatient follow-up.  History  of celiac disease: He does not follow a gluten-free diet-due to his request-he is maintained on a regular diet.  No major diarrhea noted.  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  Procedures: None  Discharge Diagnoses:  Principal Problem:   Acute hyponatremia Active Problems:   Generalized weakness   Celiac disease   GERD (gastroesophageal reflux disease)   Alcohol abuse, daily use   Hypertrophic obstructive cardiomyopathy (HOCM) (HCC)   Hypokalemia   Essential hypertension   Chronic diastolic congestive heart failure (HCC)   HLD (hyperlipidemia)   Complete heart block (HCC)   Seizures (HCC)   Gout   Falls   History of alcohol abuse   Hyponatremia   Discharge Instructions:  Activity:  As tolerated with Full fall precautions use walker/cane & assistance as needed  Discharge Instructions    Diet - low sodium heart healthy   Complete by:  As directed    Diet - low sodium heart healthy   Complete by:  As directed    Increase activity slowly   Complete by:  As directed    Increase activity slowly   Complete by:  As directed      Allergies as of 12/05/2017      Reactions   Gluten Meal Other (See Comments)   Celiac Disease - pt states on 08/14/16 that he has been eating gluten products for about 3 months with no reaction      Medication List    STOP taking these medications   amLODipine 5 MG tablet Commonly known as:  NORVASC   azithromycin 250 MG tablet Commonly known as:  ZITHROMAX   lisinopril 20 MG tablet Commonly known as:  PRINIVIL,ZESTRIL     TAKE these medications   ALEVE 220 MG tablet Generic  drug:  naproxen sodium Take 220 mg by mouth 2 (two) times daily as needed (pain/ headache).   allopurinol 300 MG tablet Commonly known as:  ZYLOPRIM TAKE 1 TABLET(300 MG) BY MOUTH DAILY   atorvastatin 20 MG tablet Commonly known as:  LIPITOR Take 1 tablet (20 mg total) by mouth daily.   clotrimazole-betamethasone  cream Commonly known as:  LOTRISONE APPLY EXTERNALLY TO THE AFFECTED AREA TWICE DAILY   colchicine 0.6 MG tablet Take 1 tablet (0.6 mg total) by mouth 2 (two) times daily as needed (gout flare). Take twice daily for 2 weeks, then use as needed for gout flare up What changed:    when to take this  reasons to take this   ferrous sulfate 325 (65 FE) MG tablet Take 1 tablet (325 mg total) by mouth daily.   folic acid 1 MG tablet Commonly known as:  FOLVITE Take 1 tablet (1 mg total) by mouth daily.   furosemide 20 MG tablet Commonly known as:  LASIX TAKE 1 TABLET(20 MG) BY MOUTH DAILY   levETIRAcetam 500 MG 24 hr tablet Commonly known as:  KEPPRA XR TAKE 3 TABLETS BY MOUTH DAILY   metoprolol tartrate 50 MG tablet Commonly known as:  LOPRESSOR Take 1 tablet (50 mg total) by mouth 2 (two) times daily.   multivitamin with minerals Tabs tablet Take 1 tablet by mouth daily.   pantoprazole 40 MG tablet Commonly known as:  PROTONIX TAKE 1 TABLET BY MOUTH TWICE DAILY BEFORE A MEAL   polyethylene glycol packet Commonly known as:  MIRALAX / GLYCOLAX Take 17 g by mouth 2 (two) times daily.   potassium chloride 10 MEQ tablet Commonly known as:  K-DUR TAKE 1 TABLET(10 MEQ) BY MOUTH DAILY   tamsulosin 0.4 MG Caps capsule Commonly known as:  FLOMAX TAKE 1 CAPSULE(0.4 MG) BY MOUTH DAILY   thiamine 100 MG tablet Take 1 tablet (100 mg total) by mouth daily.       Contact information for follow-up providers    Orlena Sheldon, PA-C. Schedule an appointment as soon as possible for a visit in 1 week(s).   Specialty:  Physician Assistant Contact information: [address] [place] [address] [phone]            Contact information for after-discharge care    [facility] Follow up.   Service:  Skilled Nursing Contact information: [address] [address] [phone]                 Allergies   Allergen Reactions  . Gluten Meal Other (See Comments)    Celiac Disease - pt states on 08/14/16 that he has been eating gluten products for about 3 months with no reaction    Consultations:   None  Other Procedures/Studies: Ct Head Wo Contrast  Result Date: 11/30/2017 CLINICAL DATA:  Patient fell yesterday.  Posttraumatic headache. EXAM: CT HEAD WITHOUT CONTRAST CT CERVICAL SPINE WITHOUT CONTRAST TECHNIQUE: Multidetector CT imaging of the head and cervical spine was performed following the standard protocol without intravenous contrast. Multiplanar CT image reconstructions of the cervical spine were also generated. COMPARISON:  08/15/2016 head CT and 11/15/2017 neck CT FINDINGS: CT HEAD FINDINGS Brain: Chronic stable involutional changes of the brain consistent with atrophy with chronic small vessel ischemia. No large vascular territory infarction, hemorrhage or midline shift. No hydrocephalus. No intra-axial mass nor extra-axial fluid collections. Vascular: No hyperdense vessels. Moderate atherosclerosis of the  carotid siphons. Skull: No skull fracture. Sinuses/Orbits: No acute abnormality.  Intact orbits and globes. Other: No significant soft tissue swelling. CT CERVICAL SPINE FINDINGS Alignment: Intact craniocervical relationship and atlantodental interval. Minimal grade 1 anterolisthesis C3 on C4 likely on the basis of degenerative disc and facet arthropathy. Skull base and vertebrae: No acute fracture. No primary bone lesion or focal pathologic process. Soft tissues and spinal canal: No prevertebral fluid or swelling. No visible canal hematoma. Disc levels: Prominent anterior osteophytes C4 through C6 with small posterior marginal osteophytes. No significant central canal stenosis. Minimal bilateral neural foraminal encroachment from uncinate spurring bilaterally at C2-3, C4-5, C5-6 and C6-7. Upper chest: Not included Other: Redemonstration of solid masslike abnormalities in the left parotid as  described on recent neck CT from 11/15/2017. The largest is approximately 11 mm and may reflect a Warthin's gland tumor is initially stated or possibly pleomorphic adenoma or parotid lymph nodes. IMPRESSION: 1. Atrophy with chronic small vessel ischemia. No acute intracranial abnormality or skull fracture. 2. Cervical spondylosis without acute cervical spine fracture. 3. Redemonstration of solid nodules in the left parotid gland as seen on recent CT neck, the largest approximately 11 mm on current study. Electronically Signed   By: Ashley Royalty M.D.   On: 11/30/2017 19:50   Ct Soft Tissue Neck W Contrast  Result Date: 11/15/2017 CLINICAL DATA:  Left parotid mass.  Tobacco abuse EXAM: CT NECK WITH CONTRAST TECHNIQUE: Multidetector CT imaging of the neck was performed using the standard protocol following the bolus administration of intravenous contrast. CONTRAST:  89m ISOVUE-300 IOPAMIDOL (ISOVUE-300) INJECTION 61% COMPARISON:  Ultrasound 10/08/2017 FINDINGS: Pharynx and larynx: Normal. No mass or swelling. Salivary glands: Soft tissue mass in the left parotid tail measures 14 x 17 x 19 mm. Mild heterogeneous enhancement with central cystic change. No calcification. Right parotid normal. Submandibular glands normal Thyroid: Negative Lymph nodes: No pathologic adenopathy in the neck Vascular: Severe carotid artery atherosclerotic calcification with carotid stenosis. Recommend carotid Doppler for further evaluation. Limited intracranial: Negative Visualized orbits: Negative Mastoids and visualized paranasal sinuses: Negative Skeleton: Negative cervical spondylosis without acute bony abnormality. Upper chest: Negative Other: None IMPRESSION: Soft tissue mass left parotid tail. 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 Severe carotid artery atherosclerotic calcification. Recommend carotid Doppler. Electronically Signed   By: CFranchot GalloM.D.   On: 11/15/2017 11:13   Ct Chest W  Contrast  Result Date: 11/30/2017 CLINICAL DATA:  Trauma, fall EXAM: CT CHEST, ABDOMEN, AND PELVIS WITH CONTRAST TECHNIQUE: Multidetector CT imaging of the chest, abdomen and pelvis was performed following the standard protocol during bolus administration of intravenous contrast. CONTRAST:  1012mISOVUE-300 IOPAMIDOL (ISOVUE-300) INJECTION 61% COMPARISON:  Radiograph 08/14/2016 FINDINGS: CT CHEST FINDINGS Cardiovascular: Moderate aortic atherosclerosis. Dense calcification at the origin of the great vessels. Ectatic ascending aorta, measuring up to 3.9 cm. Coronary artery calcification. Partially visualized cardiac pacing leads. Dense mitral annulus calcification. Normal heart size. No pericardial effusion. Mediastinum/Nodes: No mediastinal hematoma. Midline trachea. No thyroid mass. No significant adenopathy. Esophagus within normal limits except for small distal esophageal hernia Lungs/Pleura: Mild apical emphysema. No pneumothorax. No consolidation. Musculoskeletal: Degenerative changes of the spine. No acute osseous abnormality. Old right third, fourth, fifth rib fractures and old left third, fourth, fifth, and sixth anterior rib fractures. CT ABDOMEN PELVIS FINDINGS Hepatobiliary: No hepatic injury or perihepatic hematoma. Gallbladder is unremarkable Pancreas: Unremarkable. No pancreatic ductal dilatation or surrounding inflammatory changes. Spleen: No splenic injury or perisplenic hematoma. Adrenals/Urinary Tract:  No adrenal hemorrhage or renal injury identified. Bladder is unremarkable. Moderate perinephric fat stranding Stomach/Bowel: Stomach is within normal limits. Appendix appears normal. No evidence of bowel wall thickening, distention, or inflammatory changes. Vascular/Lymphatic: Moderate aortic atherosclerosis. No aneurysmal dilatation. Heavy calcification at the origin of the celiac and SMA vessels. Densely calcified renal artery origins. No significant adenopathy. Reproductive: Prostate is  unremarkable. Other: Negative for free air or free fluid. Musculoskeletal: No acute or suspicious bone lesion. Degenerative changes of the spine. IMPRESSION: 1. No CT evidence for acute thoracic abnormality. No CT evidence for acute solid organ injury within the abdomen or pelvis, negative for free air or free fluid. 2. Ectatic ascending aorta.  Moderate aortic atherosclerosis. 3. Mild apical emphysema 4. Old bilateral rib fractures Electronically Signed   By: Donavan Foil M.D.   On: 11/30/2017 19:57   Ct Cervical Spine Wo Contrast  Result Date: 11/30/2017 CLINICAL DATA:  Patient fell yesterday.  Posttraumatic headache. EXAM: CT HEAD WITHOUT CONTRAST CT CERVICAL SPINE WITHOUT CONTRAST TECHNIQUE: Multidetector CT imaging of the head and cervical spine was performed following the standard protocol without intravenous contrast. Multiplanar CT image reconstructions of the cervical spine were also generated. COMPARISON:  08/15/2016 head CT and 11/15/2017 neck CT FINDINGS: CT HEAD FINDINGS Brain: Chronic stable involutional changes of the brain consistent with atrophy with chronic small vessel ischemia. No large vascular territory infarction, hemorrhage or midline shift. No hydrocephalus. No intra-axial mass nor extra-axial fluid collections. Vascular: No hyperdense vessels. Moderate atherosclerosis of the carotid siphons. Skull: No skull fracture. Sinuses/Orbits: No acute abnormality.  Intact orbits and globes. Other: No significant soft tissue swelling. CT CERVICAL SPINE FINDINGS Alignment: Intact craniocervical relationship and atlantodental interval. Minimal grade 1 anterolisthesis C3 on C4 likely on the basis of degenerative disc and facet arthropathy. Skull base and vertebrae: No acute fracture. No primary bone lesion or focal pathologic process. Soft tissues and spinal canal: No prevertebral fluid or swelling. No visible canal hematoma. Disc levels: Prominent anterior osteophytes C4 through C6 with small  posterior marginal osteophytes. No significant central canal stenosis. Minimal bilateral neural foraminal encroachment from uncinate spurring bilaterally at C2-3, C4-5, C5-6 and C6-7. Upper chest: Not included Other: Redemonstration of solid masslike abnormalities in the left parotid as described on recent neck CT from 11/15/2017. The largest is approximately 11 mm and may reflect a Warthin's gland tumor is initially stated or possibly pleomorphic adenoma or parotid lymph nodes. IMPRESSION: 1. Atrophy with chronic small vessel ischemia. No acute intracranial abnormality or skull fracture. 2. Cervical spondylosis without acute cervical spine fracture. 3. Redemonstration of solid nodules in the left parotid gland as seen on recent CT neck, the largest approximately 11 mm on current study. Electronically Signed   By: Ashley Royalty M.D.   On: 11/30/2017 19:50   Ct Abdomen Pelvis W Contrast  Result Date: 11/30/2017 CLINICAL DATA:  Trauma, fall EXAM: CT CHEST, ABDOMEN, AND PELVIS WITH CONTRAST TECHNIQUE: Multidetector CT imaging of the chest, abdomen and pelvis was performed following the standard protocol during bolus administration of intravenous contrast. CONTRAST:  113m ISOVUE-300 IOPAMIDOL (ISOVUE-300) INJECTION 61% COMPARISON:  Radiograph 08/14/2016 FINDINGS: CT CHEST FINDINGS Cardiovascular: Moderate aortic atherosclerosis. Dense calcification at the origin of the great vessels. Ectatic ascending aorta, measuring up to 3.9 cm. Coronary artery calcification. Partially visualized cardiac pacing leads. Dense mitral annulus calcification. Normal heart size. No pericardial effusion. Mediastinum/Nodes: No mediastinal hematoma. Midline trachea. No thyroid mass. No significant adenopathy. Esophagus within normal limits except for small distal esophageal  hernia Lungs/Pleura: Mild apical emphysema. No pneumothorax. No consolidation. Musculoskeletal: Degenerative changes of the spine. No acute osseous abnormality. Old  right third, fourth, fifth rib fractures and old left third, fourth, fifth, and sixth anterior rib fractures. CT ABDOMEN PELVIS FINDINGS Hepatobiliary: No hepatic injury or perihepatic hematoma. Gallbladder is unremarkable Pancreas: Unremarkable. No pancreatic ductal dilatation or surrounding inflammatory changes. Spleen: No splenic injury or perisplenic hematoma. Adrenals/Urinary Tract: No adrenal hemorrhage or renal injury identified. Bladder is unremarkable. Moderate perinephric fat stranding Stomach/Bowel: Stomach is within normal limits. Appendix appears normal. No evidence of bowel wall thickening, distention, or inflammatory changes. Vascular/Lymphatic: Moderate aortic atherosclerosis. No aneurysmal dilatation. Heavy calcification at the origin of the celiac and SMA vessels. Densely calcified renal artery origins. No significant adenopathy. Reproductive: Prostate is unremarkable. Other: Negative for free air or free fluid. Musculoskeletal: No acute or suspicious bone lesion. Degenerative changes of the spine. IMPRESSION: 1. No CT evidence for acute thoracic abnormality. No CT evidence for acute solid organ injury within the abdomen or pelvis, negative for free air or free fluid. 2. Ectatic ascending aorta.  Moderate aortic atherosclerosis. 3. Mild apical emphysema 4. Old bilateral rib fractures Electronically Signed   By: Donavan Foil M.D.   On: 11/30/2017 19:57   Dg Knee Complete 4 Views Right  Result Date: 11/30/2017 CLINICAL DATA:  Status post fall. EXAM: RIGHT KNEE - COMPLETE 4+ VIEW COMPARISON:  07/04/2015 FINDINGS: No fracture. No subluxation or dislocation. Trace spurring in the patellofemoral compartment. No worrisome lytic or sclerotic osseous abnormality. No joint effusion. IMPRESSION: Negative. Electronically Signed   By: Misty Stanley M.D.   On: 11/30/2017 20:00      TODAY-DAY OF DISCHARGE:  Subjective:   Darrien Belter today has no headache,no chest abdominal pain,no new weakness  tingling or numbness, feels much better wants to go home today.   Objective:   Blood pressure (!) 133/57, pulse 62, temperature 98.3 F (36.8 C), temperature source Oral, resp. rate 18, height 5' 9"  (1.753 m), weight 85.9 kg (189 lb 6 oz), SpO2 96 %.  Intake/Output Summary (Last 24 hours) at 12/05/2017 1238 Last data filed at 12/05/2017 0900 Gross per 24 hour  Intake 720 ml  Output -  Net 720 ml   Filed Weights   12/03/17 0426 12/04/17 0506 12/05/17 0427  Weight: 88.5 kg (195 lb 1.7 oz) 84.8 kg (186 lb 15.2 oz) 85.9 kg (189 lb 6 oz)    Exam: Awake Alert, Oriented *3, No new F.N deficits, Normal affect [place].AT,PERRAL Supple Neck,No JVD, No cervical lymphadenopathy appriciated.  Symmetrical Chest wall movement, Good air movement bilaterally, CTAB RRR,No Gallops,Rubs or new Murmurs, No Parasternal Heave +ve B.Sounds, Abd Soft, Non tender, No organomegaly appriciated, No rebound -guarding or rigidity. No Cyanosis, Clubbing or edema, No new Rash or bruise   PERTINENT RADIOLOGIC STUDIES: Ct Head Wo Contrast  Result Date: 11/30/2017 CLINICAL DATA:  Patient fell yesterday.  Posttraumatic headache. EXAM: CT HEAD WITHOUT CONTRAST CT CERVICAL SPINE WITHOUT CONTRAST TECHNIQUE: Multidetector CT imaging of the head and cervical spine was performed following the standard protocol without intravenous contrast. Multiplanar CT image reconstructions of the cervical spine were also generated. COMPARISON:  08/15/2016 head CT and 11/15/2017 neck CT FINDINGS: CT HEAD FINDINGS Brain: Chronic stable involutional changes of the brain consistent with atrophy with chronic small vessel ischemia. No large vascular territory infarction, hemorrhage or midline shift. No hydrocephalus. No intra-axial mass nor extra-axial fluid collections. Vascular: No hyperdense vessels. Moderate atherosclerosis of the carotid siphons. Skull: No  skull fracture. Sinuses/Orbits: No acute abnormality.  Intact orbits and globes. Other: No  significant soft tissue swelling. CT CERVICAL SPINE FINDINGS Alignment: Intact craniocervical relationship and atlantodental interval. Minimal grade 1 anterolisthesis C3 on C4 likely on the basis of degenerative disc and facet arthropathy. Skull base and vertebrae: No acute fracture. No primary bone lesion or focal pathologic process. Soft tissues and spinal canal: No prevertebral fluid or swelling. No visible canal hematoma. Disc levels: Prominent anterior osteophytes C4 through C6 with small posterior marginal osteophytes. No significant central canal stenosis. Minimal bilateral neural foraminal encroachment from uncinate spurring bilaterally at C2-3, C4-5, C5-6 and C6-7. Upper chest: Not included Other: Redemonstration of solid masslike abnormalities in the left parotid as described on recent neck CT from 11/15/2017. The largest is approximately 11 mm and may reflect a Warthin's gland tumor is initially stated or possibly pleomorphic adenoma or parotid lymph nodes. IMPRESSION: 1. Atrophy with chronic small vessel ischemia. No acute intracranial abnormality or skull fracture. 2. Cervical spondylosis without acute cervical spine fracture. 3. Redemonstration of solid nodules in the left parotid gland as seen on recent CT neck, the largest approximately 11 mm on current study. Electronically Signed   By: Ashley Royalty M.D.   On: 11/30/2017 19:50   Ct Soft Tissue Neck W Contrast  Result Date: 11/15/2017 CLINICAL DATA:  Left parotid mass.  Tobacco abuse EXAM: CT NECK WITH CONTRAST TECHNIQUE: Multidetector CT imaging of the neck was performed using the standard protocol following the bolus administration of intravenous contrast. CONTRAST:  36m ISOVUE-300 IOPAMIDOL (ISOVUE-300) INJECTION 61% COMPARISON:  Ultrasound 10/08/2017 FINDINGS: Pharynx and larynx: Normal. No mass or swelling. Salivary glands: Soft tissue mass in the left parotid tail measures 14 x 17 x 19 mm. Mild heterogeneous enhancement with central cystic  change. No calcification. Right parotid normal. Submandibular glands normal Thyroid: Negative Lymph nodes: No pathologic adenopathy in the neck Vascular: Severe carotid artery atherosclerotic calcification with carotid stenosis. Recommend carotid Doppler for further evaluation. Limited intracranial: Negative Visualized orbits: Negative Mastoids and visualized paranasal sinuses: Negative Skeleton: Negative cervical spondylosis without acute bony abnormality. Upper chest: Negative Other: None IMPRESSION: Soft tissue mass left parotid tail. 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 Severe carotid artery atherosclerotic calcification. Recommend carotid Doppler. Electronically Signed   By: CFranchot GalloM.D.   On: 11/15/2017 11:13   Ct Chest W Contrast  Result Date: 11/30/2017 CLINICAL DATA:  Trauma, fall EXAM: CT CHEST, ABDOMEN, AND PELVIS WITH CONTRAST TECHNIQUE: Multidetector CT imaging of the chest, abdomen and pelvis was performed following the standard protocol during bolus administration of intravenous contrast. CONTRAST:  1051mISOVUE-300 IOPAMIDOL (ISOVUE-300) INJECTION 61% COMPARISON:  Radiograph 08/14/2016 FINDINGS: CT CHEST FINDINGS Cardiovascular: Moderate aortic atherosclerosis. Dense calcification at the origin of the great vessels. Ectatic ascending aorta, measuring up to 3.9 cm. Coronary artery calcification. Partially visualized cardiac pacing leads. Dense mitral annulus calcification. Normal heart size. No pericardial effusion. Mediastinum/Nodes: No mediastinal hematoma. Midline trachea. No thyroid mass. No significant adenopathy. Esophagus within normal limits except for small distal esophageal hernia Lungs/Pleura: Mild apical emphysema. No pneumothorax. No consolidation. Musculoskeletal: Degenerative changes of the spine. No acute osseous abnormality. Old right third, fourth, fifth rib fractures and old left third, fourth, fifth, and sixth anterior rib  fractures. CT ABDOMEN PELVIS FINDINGS Hepatobiliary: No hepatic injury or perihepatic hematoma. Gallbladder is unremarkable Pancreas: Unremarkable. No pancreatic ductal dilatation or surrounding inflammatory changes. Spleen: No splenic injury or perisplenic hematoma. Adrenals/Urinary Tract: No adrenal hemorrhage  or renal injury identified. Bladder is unremarkable. Moderate perinephric fat stranding Stomach/Bowel: Stomach is within normal limits. Appendix appears normal. No evidence of bowel wall thickening, distention, or inflammatory changes. Vascular/Lymphatic: Moderate aortic atherosclerosis. No aneurysmal dilatation. Heavy calcification at the origin of the celiac and SMA vessels. Densely calcified renal artery origins. No significant adenopathy. Reproductive: Prostate is unremarkable. Other: Negative for free air or free fluid. Musculoskeletal: No acute or suspicious bone lesion. Degenerative changes of the spine. IMPRESSION: 1. No CT evidence for acute thoracic abnormality. No CT evidence for acute solid organ injury within the abdomen or pelvis, negative for free air or free fluid. 2. Ectatic ascending aorta.  Moderate aortic atherosclerosis. 3. Mild apical emphysema 4. Old bilateral rib fractures Electronically Signed   By: Donavan Foil M.D.   On: 11/30/2017 19:57   Ct Cervical Spine Wo Contrast  Result Date: 11/30/2017 CLINICAL DATA:  Patient fell yesterday.  Posttraumatic headache. EXAM: CT HEAD WITHOUT CONTRAST CT CERVICAL SPINE WITHOUT CONTRAST TECHNIQUE: Multidetector CT imaging of the head and cervical spine was performed following the standard protocol without intravenous contrast. Multiplanar CT image reconstructions of the cervical spine were also generated. COMPARISON:  08/15/2016 head CT and 11/15/2017 neck CT FINDINGS: CT HEAD FINDINGS Brain: Chronic stable involutional changes of the brain consistent with atrophy with chronic small vessel ischemia. No large vascular territory infarction,  hemorrhage or midline shift. No hydrocephalus. No intra-axial mass nor extra-axial fluid collections. Vascular: No hyperdense vessels. Moderate atherosclerosis of the carotid siphons. Skull: No skull fracture. Sinuses/Orbits: No acute abnormality.  Intact orbits and globes. Other: No significant soft tissue swelling. CT CERVICAL SPINE FINDINGS Alignment: Intact craniocervical relationship and atlantodental interval. Minimal grade 1 anterolisthesis C3 on C4 likely on the basis of degenerative disc and facet arthropathy. Skull base and vertebrae: No acute fracture. No primary bone lesion or focal pathologic process. Soft tissues and spinal canal: No prevertebral fluid or swelling. No visible canal hematoma. Disc levels: Prominent anterior osteophytes C4 through C6 with small posterior marginal osteophytes. No significant central canal stenosis. Minimal bilateral neural foraminal encroachment from uncinate spurring bilaterally at C2-3, C4-5, C5-6 and C6-7. Upper chest: Not included Other: Redemonstration of solid masslike abnormalities in the left parotid as described on recent neck CT from 11/15/2017. The largest is approximately 11 mm and may reflect a Warthin's gland tumor is initially stated or possibly pleomorphic adenoma or parotid lymph nodes. IMPRESSION: 1. Atrophy with chronic small vessel ischemia. No acute intracranial abnormality or skull fracture. 2. Cervical spondylosis without acute cervical spine fracture. 3. Redemonstration of solid nodules in the left parotid gland as seen on recent CT neck, the largest approximately 11 mm on current study. Electronically Signed   By: Ashley Royalty M.D.   On: 11/30/2017 19:50   Ct Abdomen Pelvis W Contrast  Result Date: 11/30/2017 CLINICAL DATA:  Trauma, fall EXAM: CT CHEST, ABDOMEN, AND PELVIS WITH CONTRAST TECHNIQUE: Multidetector CT imaging of the chest, abdomen and pelvis was performed following the standard protocol during bolus administration of intravenous  contrast. CONTRAST:  132m ISOVUE-300 IOPAMIDOL (ISOVUE-300) INJECTION 61% COMPARISON:  Radiograph 08/14/2016 FINDINGS: CT CHEST FINDINGS Cardiovascular: Moderate aortic atherosclerosis. Dense calcification at the origin of the great vessels. Ectatic ascending aorta, measuring up to 3.9 cm. Coronary artery calcification. Partially visualized cardiac pacing leads. Dense mitral annulus calcification. Normal heart size. No pericardial effusion. Mediastinum/Nodes: No mediastinal hematoma. Midline trachea. No thyroid mass. No significant adenopathy. Esophagus within normal limits except for small distal esophageal hernia Lungs/Pleura: Mild  apical emphysema. No pneumothorax. No consolidation. Musculoskeletal: Degenerative changes of the spine. No acute osseous abnormality. Old right third, fourth, fifth rib fractures and old left third, fourth, fifth, and sixth anterior rib fractures. CT ABDOMEN PELVIS FINDINGS Hepatobiliary: No hepatic injury or perihepatic hematoma. Gallbladder is unremarkable Pancreas: Unremarkable. No pancreatic ductal dilatation or surrounding inflammatory changes. Spleen: No splenic injury or perisplenic hematoma. Adrenals/Urinary Tract: No adrenal hemorrhage or renal injury identified. Bladder is unremarkable. Moderate perinephric fat stranding Stomach/Bowel: Stomach is within normal limits. Appendix appears normal. No evidence of bowel wall thickening, distention, or inflammatory changes. Vascular/Lymphatic: Moderate aortic atherosclerosis. No aneurysmal dilatation. Heavy calcification at the origin of the celiac and SMA vessels. Densely calcified renal artery origins. No significant adenopathy. Reproductive: Prostate is unremarkable. Other: Negative for free air or free fluid. Musculoskeletal: No acute or suspicious bone lesion. Degenerative changes of the spine. IMPRESSION: 1. No CT evidence for acute thoracic abnormality. No CT evidence for acute solid organ injury within the abdomen or pelvis,  negative for free air or free fluid. 2. Ectatic ascending aorta.  Moderate aortic atherosclerosis. 3. Mild apical emphysema 4. Old bilateral rib fractures Electronically Signed   By: Donavan Foil M.D.   On: 11/30/2017 19:57   Dg Knee Complete 4 Views Right  Result Date: 11/30/2017 CLINICAL DATA:  Status post fall. EXAM: RIGHT KNEE - COMPLETE 4+ VIEW COMPARISON:  07/04/2015 FINDINGS: No fracture. No subluxation or dislocation. Trace spurring in the patellofemoral compartment. No worrisome lytic or sclerotic osseous abnormality. No joint effusion. IMPRESSION: Negative. Electronically Signed   By: Misty Stanley M.D.   On: 11/30/2017 20:00     PERTINENT LAB RESULTS: CBC: Recent Labs    12/03/17 0512  WBC 7.8  HGB 12.1*  HCT 34.0*  PLT 220   CMET CMP     Component Value Date/Time   NA 129 (L) 12/04/2017 0605   K 3.5 12/04/2017 0605   CL 93 (L) 12/04/2017 0605   CO2 29 12/04/2017 0605   GLUCOSE 98 12/04/2017 0605   BUN 12 12/04/2017 0605   CREATININE 0.95 12/04/2017 0605   CREATININE 1.00 10/04/2017 1513   CALCIUM 8.9 12/04/2017 0605   PROT 6.8 12/01/2017 0528   ALBUMIN 3.4 (L) 12/01/2017 0528   AST 61 (H) 12/01/2017 0528   ALT 27 12/01/2017 0528   ALKPHOS 63 12/01/2017 0528   BILITOT 1.0 12/01/2017 0528   GFRNONAA >60 12/04/2017 0605   GFRNONAA 75 10/04/2017 1513   GFRAA >60 12/04/2017 0605   GFRAA 87 10/04/2017 1513    GFR Estimated Creatinine Clearance: 76.4 mL/min (by C-G formula based on SCr of 0.95 mg/dL). No results for input(s): LIPASE, AMYLASE in the last 72 hours. No results for input(s): CKTOTAL, CKMB, CKMBINDEX, TROPONINI in the last 72 hours. Invalid input(s): POCBNP No results for input(s): DDIMER in the last 72 hours. No results for input(s): HGBA1C in the last 72 hours. No results for input(s): CHOL, HDL, LDLCALC, TRIG, CHOLHDL, LDLDIRECT in the last 72 hours. No results for input(s): TSH, T4TOTAL, T3FREE, THYROIDAB in the last 72 hours.  Invalid  input(s): FREET3 No results for input(s): VITAMINB12, FOLATE, FERRITIN, TIBC, IRON, RETICCTPCT in the last 72 hours. Coags: No results for input(s): INR in the last 72 hours.  Invalid input(s): PT Microbiology: Recent Results (from the past 240 hour(s))  Urine culture     Status: Abnormal   Collection Time: 11/30/17  3:58 PM  Result Value Ref Range Status   Specimen Description URINE, RANDOM  Final   Special Requests NONE  Final   Culture (A)  Final    10,000 COLONIES/mL GROUP B STREP(S.AGALACTIAE)ISOLATED TESTING AGAINST S. AGALACTIAE NOT ROUTINELY PERFORMED DUE TO PREDICTABILITY OF AMP/PEN/VAN SUSCEPTIBILITY. Performed at [facility], [place] [address], [place], [place] [address]    Report Status 12/01/2017 FINAL  Final  MRSA PCR Screening     Status: None   Collection Time: 12/01/17  5:00 AM  Result Value Ref Range Status   MRSA by PCR NEGATIVE NEGATIVE Final    Comment:        The GeneXpert MRSA Assay (FDA approved for NASAL specimens only), is one component of a comprehensive MRSA colonization surveillance program. It is not intended to diagnose MRSA infection nor to guide or monitor treatment for MRSA infections.     FURTHER DISCHARGE INSTRUCTIONS:  Get Medicines reviewed and adjusted: Please take all your medications with you for your next visit with your Primary MD  Laboratory/radiological data: Please request your Primary MD to go over all hospital tests and procedure/radiological results at the follow up, please ask your Primary MD to get all Hospital records sent to his/her office.  In some cases, they will be blood work, cultures and biopsy results pending at the time of your discharge. Please request that your primary care M.D. goes through all the records of your hospital data and follows up on these results.  Also Note the following: If you experience worsening of your admission symptoms, develop shortness of breath, life threatening emergency,  suicidal or homicidal thoughts you must seek medical attention immediately by calling 911 or calling your MD immediately  if symptoms less severe.  You must read complete instructions/literature along with all the possible adverse reactions/side effects for all the Medicines you take and that have been prescribed to you. Take any new Medicines after you have completely understood and accpet all the possible adverse reactions/side effects.   Do not drive when taking Pain medications or sleeping medications (Benzodaizepines)  Do not take more than prescribed Pain, Sleep and Anxiety Medications. It is not advisable to combine anxiety,sleep and pain medications without talking with your primary care practitioner  Special Instructions: If you have smoked or chewed Tobacco  in the last 2 yrs please stop smoking, stop any regular Alcohol  and or any Recreational drug use.  Wear Seat belts while driving.  Please note: You were cared for by a hospitalist during your hospital stay. Once you are discharged, your primary care physician will handle any further medical issues. Please note that NO REFILLS for any discharge medications will be authorized once you are discharged, as it is imperative that you return to your primary care physician (or establish a relationship with a primary care physician if you do not have one) for your post hospital discharge needs so that they can reassess your need for medications and monitor your lab values.  Total Time spent coordinating discharge including counseling, education and face to face time equals 45 minutes.  SignedOren Binet 12/05/2017 12:38 PM

## 2017-12-05 NOTE — Progress Notes (Signed)
Patient has discharged to [facility] for rehab on 12/05/17; Discharge instructions including medications and appointments were done and sent to SNF; SW is notified. [facility] will transport patient to SNF.  RN called to give a report at 1500.

## 2017-12-05 NOTE — Clinical Social Work Placement (Signed)
Patient received and accepted bed offer at [facility]. Facility aware of patient's discharge and confirmed bed offer. SNF staff reported that they started auth and agreed to accept 5 day LOG for auth. [facility] contacted, patient notified. Patient's RN can call report to [phone] Room 124A, packet complete. CSW signing off, no other needs identified at this time.  CLINICAL SOCIAL WORK PLACEMENT  NOTE  Date:  12/05/2017  Patient Details  Name: Francisco [name] MRN: [id number] Date of Birth: Apr 02, 1946  Clinical Social Work is seeking post-discharge placement for this patient at the [place] level of care (*CSW will initial, date and re-position this form in  chart as items are completed):  Yes   Patient/family provided with [place] Work Department's list of facilities offering this level of care within the geographic area requested by the patient (or if unable, by the patient's family).  Yes   Patient/family informed of their freedom to choose among providers that offer the needed level of care, that participate in Medicare, Medicaid or managed care program needed by the patient, have an available bed and are willing to accept the patient.  Yes   Patient/family informed of [facility]'s ownership interest in [facility] and [facility], as well as of the fact that they are under no obligation to receive care at these facilities.  PASRR submitted to EDS on 12/03/17     PASRR number received on 12/03/17     Existing PASRR number confirmed on 12/03/17     FL2 transmitted to all facilities in geographic area requested by pt/family on 12/03/17     FL2 transmitted to all facilities within larger geographic area on       Patient informed that his/her managed care company has contracts with or will negotiate with certain facilities, including the following:        Yes   Patient/family informed of bed offers received.  Patient chooses  bed at [facility]     Physician recommends and patient chooses bed at      Patient to be transferred to [facility] on 12/05/17.  Patient to be transferred to facility by [facility]     Patient family notified on 12/05/17 of transfer.  Name of family member notified:  Patient reported that he will notify family     PHYSICIAN       Additional Comment:    _______________________________________________ Burnis Medin, LCSW 12/05/2017, 2:38 PM

## 2017-12-06 ENCOUNTER — Encounter: Payer: Medicare HMO | Admitting: Cardiology

## 2017-12-06 NOTE — Progress Notes (Deleted)
Electrophysiology Office Note   Date:  12/06/2017   ID:  Maninder, Deboer 02/12/1946, MRN [id number]  PCP:  Rennis Golden  Primary Electrophysiologist:  Abhimanyu Cruces Meredith Leeds, MD    No chief complaint on file.    History of Present Illness: JAMARII BANKS is a 72 y.o. male who presents today for electrophysiology evaluation.    He presented to the hospital in September for the evaluation of seizures. At that time he was found to be in complete heart block with a ventricular rate in the 30s and altered mental mental status requiring transcutaneous pacing. He had an urgent temporary pacemaker placed which dislodged and ultimately had a dual chamber pacemaker placed.   Today, denies symptoms of palpitations, chest pain, shortness of breath, orthopnea, PND, lower extremity edema, claudication, dizziness, presyncope, syncope, bleeding, or neurologic sequela. The patient is tolerating medications without difficulties. ***   Past Medical History:  Diagnosis Date  . Anemia   . Celiac disease   . GERD (gastroesophageal reflux disease)   . Heart murmur   . Hypertension   . Multiple duodenal ulcers   . Shortness of breath   . Symptomatic Bradycardia    a. 07/2015 s/p MDT Advisa L DC PPM (Ser #: [id number]).  . 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 CARDIAC CATHETERIZATION N/A 07/08/2015   Procedure: Left Heart Cath and Coronary Angiography;  Surgeon: Troy Sine, MD;  Location: [facility];  Service: Cardiovascular;  Laterality: N/A;  . CARDIAC CATHETERIZATION N/A 07/08/2015   Procedure: Temporary Pacemaker;  Surgeon: Troy Sine, MD;  Location: [facility];  Service: Cardiovascular;  Laterality: N/A;  . CARDIAC CATHETERIZATION N/A 07/09/2015   Procedure: Temporary Pacemaker;  Surgeon: Troy Sine, MD;  Location: [facility];  Service: Cardiovascular;  Laterality: N/A;  .  EP IMPLANTABLE DEVICE N/A 07/09/2015   Procedure: Pacemaker Implant;  Surgeon: Syncere Kaminski Meredith Leeds, MD;  Location: [place] [facility];  Service: Cardiovascular;  Laterality: N/A;  . ESOPHAGOGASTRODUODENOSCOPY  07/03/2012   Procedure: ESOPHAGOGASTRODUODENOSCOPY (EGD);  Surgeon: Winfield Cunas., MD;  Location: [facility] ENDOSCOPY;  Service: Endoscopy;  Laterality: N/A;  . HEMORRHOID SURGERY    . HERNIA REPAIR    . MOLE REMOVAL       Current Outpatient Medications  Medication Sig Dispense Refill  . allopurinol (ZYLOPRIM) 300 MG tablet TAKE 1 TABLET(300 MG) BY MOUTH DAILY 90 tablet 0  . atorvastatin (LIPITOR) 20 MG tablet Take 1 tablet (20 mg total) by mouth daily. 90 tablet 3  . clotrimazole-betamethasone (LOTRISONE) cream APPLY EXTERNALLY TO THE AFFECTED AREA TWICE DAILY (Patient not taking: Reported on 11/30/2017) 45 g 0  . colchicine 0.6 MG tablet Take 1 tablet (0.6 mg total) by mouth 2 (two) times daily as needed (gout flare). Take twice daily for 2 weeks, then use as needed for gout flare up 60 tablet 0  . ferrous sulfate 325 (65 FE) MG tablet Take 1 tablet (325 mg total) by mouth daily.    . folic acid (FOLVITE) 1 MG tablet Take 1 tablet (1 mg total) by mouth daily.    . furosemide (LASIX) 20 MG tablet TAKE 1 TABLET(20 MG) BY MOUTH DAILY 90 tablet 0  . levETIRAcetam (KEPPRA XR) 500 MG 24 hr tablet TAKE 3 TABLETS BY MOUTH DAILY 270 tablet 1  . metoprolol tartrate (LOPRESSOR) 50 MG tablet Take 1 tablet (50  mg total) by mouth 2 (two) times daily. 60 tablet 6  . Multiple Vitamin (MULTIVITAMIN WITH MINERALS) TABS tablet Take 1 tablet by mouth daily.    . naproxen sodium (ALEVE) 220 MG tablet Take 220 mg by mouth 2 (two) times daily as needed (pain/ headache).    . pantoprazole (PROTONIX) 40 MG tablet TAKE 1 TABLET BY MOUTH TWICE DAILY BEFORE A MEAL 180 tablet 0  . polyethylene glycol (MIRALAX / GLYCOLAX) packet Take 17 g by mouth 2 (two) times daily. 14 each 0  . potassium chloride (K-DUR) 10 MEQ  tablet TAKE 1 TABLET(10 MEQ) BY MOUTH DAILY 90 tablet 0  . tamsulosin (FLOMAX) 0.4 MG CAPS capsule TAKE 1 CAPSULE(0.4 MG) BY MOUTH DAILY 90 capsule 0  . thiamine 100 MG tablet Take 1 tablet (100 mg total) by mouth daily.     No current facility-administered medications for this visit.     Allergies:   Gluten meal   Social History:  The patient  reports that he has quit smoking. His smoking use included cigarettes. He has a 25.00 pack-year smoking history. He has quit using smokeless tobacco. He reports that he drinks alcohol. He reports that he does not use drugs.   Family History:  The patient's family history includes Hodgkin's lymphoma in his father.   ROS:  Please see the history of present illness.   Otherwise, review of systems is positive for ***.   All other systems are reviewed and negative.   PHYSICAL EXAM: VS:  There were no vitals taken for this visit. , BMI There is no height or weight on file to calculate BMI. GEN: Well nourished, well developed, in no acute distress  HEENT: normal  Neck: no JVD, carotid bruits, or masses Cardiac: ***RRR; no murmurs, rubs, or gallops,no edema  Respiratory:  clear to auscultation bilaterally, normal work of breathing GI: soft, nontender, nondistended, + BS MS: no deformity or atrophy  Skin: warm and dry, ***device site well healed Neuro:  Strength and sensation are intact Psych: euthymic mood, full affect  EKG:  EKG {ACTION; IS/IS [id number]} ordered today. Personal review of the ekg ordered *** shows ***  ***Personal review of the device interrogation today. Results in [place]: 11/30/2017: TSH 1.372 12/01/2017: ALT 27; Magnesium 1.7 12/03/2017: Hemoglobin 12.1; Platelets 220 12/04/2017: BUN 12; Creatinine, Ser 0.95; Potassium 3.5; Sodium 129    Lipid Panel     Component Value Date/Time   CHOL 154 02/22/2017 0944   TRIG 67 02/22/2017 0944   HDL 69 02/22/2017 0944   CHOLHDL 2.2 02/22/2017 0944   VLDL 13  02/22/2017 0944   LDLCALC 72 02/22/2017 0944     Wt Readings from Last 3 Encounters:  12/05/17 189 lb 6 oz (85.9 kg)  10/04/17 198 lb 12.8 oz (90.2 kg)  02/22/17 209 lb 3.2 oz (94.9 kg)      Other studies Reviewed: Additional studies/ records that were reviewed today include: 07/08/15 Cath Review of the above records today demonstrates:  Complete heart block with ventricular rates in the low 30s requiring insertion of a transvenous pacemaker.  Normal coronary arteries.  Hyperdynamic LV function with an ejection fraction of a aproximately 65%.    ASSESSMENT AND PLAN:  1.  Complete heart block: patient presented to the hospital with complete heart block. Received dual-chamber pacemaker which is currently functioning well without any problems. He is not having any complaints related to his pacemaker. Device site without current issues. No indication for pocket  revision.***  2. Hypertension: well controlled, no changes necessary***   3. Ventricular high rate episodes: Seen on pacemaker interrogation. We'll start 25 mg of metoprolol twice a day. ***  Current medicines are reviewed at length with the patient today.   The patient does not have concerns regarding his medicines.  The following changes were made today:  ***  Labs/ tests ordered today include:  No orders of the defined types were placed in this encounter.    Disposition:   FU with Deaven Urwin *** months  Signed, Mitzi Lilja Meredith Leeds, MD  12/06/2017 8:46 AM     [facility] [address] [place] [address] [phone] (office) [phone] (fax)

## 2017-12-07 ENCOUNTER — Encounter: Payer: Self-pay | Admitting: Cardiology

## 2017-12-07 DIAGNOSIS — E871 Hypo-osmolality and hyponatremia: Secondary | ICD-10-CM | POA: Diagnosis not present

## 2017-12-07 DIAGNOSIS — F1010 Alcohol abuse, uncomplicated: Secondary | ICD-10-CM | POA: Diagnosis not present

## 2017-12-07 DIAGNOSIS — R262 Difficulty in walking, not elsewhere classified: Secondary | ICD-10-CM | POA: Diagnosis not present

## 2017-12-07 DIAGNOSIS — I10 Essential (primary) hypertension: Secondary | ICD-10-CM | POA: Diagnosis not present

## 2017-12-11 ENCOUNTER — Other Ambulatory Visit: Payer: Self-pay

## 2017-12-11 NOTE — Patient Outreach (Signed)
[facility]) Care Management  12/11/2017  Francisco [name] [birth year] [id number]   Telephone call to [facility] [phone].  Spoke with Nira Conn and she verifies patient is inpatient there.    Plan: RN CM will notify Kandis Mannan of patient admission to facility.    Jone Baseman, RN, MSN [facility] Care Management Care Management Coordinator Direct Line [phone] Toll Free: [phone]  Fax: [phone]

## 2017-12-12 NOTE — Addendum Note (Signed)
Addended by: Jone Baseman on: 12/12/2017 06:37 PM   Modules accepted: Orders

## 2017-12-13 ENCOUNTER — Telehealth: Payer: Self-pay

## 2017-12-13 NOTE — Telephone Encounter (Signed)
I spoke with Dr Stephens Shire nurse Jacqlyn Larsen and she stated he is out of the office until Mon 2/11 he is making rounds at hospital. Jacqlyn Larsen stated you could either send him an in basket message or call the office on  Monday 2/11 and do a peer to peer with him.

## 2017-12-13 NOTE — Telephone Encounter (Signed)
-----   Message from Orlena Sheldon, PA-C sent at 12/12/2017  7:43 AM EST ----- I had sent a note to Dr. Trula Slade, the doctor who was reading this test, and had asked him to give me a final impression/conclusion for test findings-- but still have not gotten these results.   Please call his office and speak to his nurse and explain that I need final test findings so that I can follow-up results with patient.   I ordered the test and I am responsible for following this up.

## 2017-12-14 ENCOUNTER — Ambulatory Visit: Payer: Self-pay | Admitting: Licensed Clinical Social Worker

## 2017-12-17 DIAGNOSIS — R0981 Nasal congestion: Secondary | ICD-10-CM | POA: Diagnosis not present

## 2017-12-17 DIAGNOSIS — R05 Cough: Secondary | ICD-10-CM | POA: Diagnosis not present

## 2017-12-17 DIAGNOSIS — J069 Acute upper respiratory infection, unspecified: Secondary | ICD-10-CM | POA: Diagnosis not present

## 2017-12-20 NOTE — Telephone Encounter (Signed)
Call placed to Br. Brabham she will fax over the final test findings

## 2017-12-21 ENCOUNTER — Other Ambulatory Visit: Payer: Self-pay

## 2017-12-21 ENCOUNTER — Other Ambulatory Visit: Payer: Self-pay | Admitting: *Deleted

## 2017-12-21 NOTE — Patient Outreach (Signed)
[employer] [facility]) Care Management  12/21/2017  ANTWAN PANDYA 10-06-1946 [id number]   CSW received call from [place], [place] that patient had discharged from [facility] to [facility] ALF. CSW closing case as patient is now in an assisted living. RNCM sending message to [place] PCP.    Raynaldo Opitz, LCSW [facility]  Clinical Social Worker cell #: [phone]

## 2017-12-21 NOTE — Patient Outreach (Addendum)
[facility]) Care Management  12/21/2017  Francisco [name] 01-04-1946 [id number]   Telephone call to patient for transition of care follow up.  Number has a fast busy signal.    Telephone call to primary office to verify phone number.  Spoke with Francisco [name] she verifies the only number she has which is the same number listed in [place].  Telephone call to patient's sister Francisco [name] at [phone].  She states that she does not have a number to contact her brother but states he is at [facility].    Telephone call to [facility].  Spoke with [place] at the facility.  She states that patient was admitted on yesterday and that they provide total care for the patient including their own therapy.  Advised her that [facility] could help with providing care management assistance to patient if needed and CM contact information given. She verbalized understanding and states if patient has any needs she would contact us.    Plan:  RN CM will notify social work of patient status.   RN CM will close case at this time and notify care management assistant of case status.    Jone Baseman, RN, MSN [facility] Care Management Care Management Coordinator Direct Line [phone] Toll Free: [phone]  Fax: [phone]

## 2017-12-24 DIAGNOSIS — E871 Hypo-osmolality and hyponatremia: Secondary | ICD-10-CM | POA: Diagnosis not present

## 2017-12-25 DIAGNOSIS — Z9181 History of falling: Secondary | ICD-10-CM | POA: Diagnosis not present

## 2017-12-25 DIAGNOSIS — G40909 Epilepsy, unspecified, not intractable, without status epilepticus: Secondary | ICD-10-CM | POA: Diagnosis not present

## 2017-12-25 DIAGNOSIS — I5032 Chronic diastolic (congestive) heart failure: Secondary | ICD-10-CM | POA: Diagnosis not present

## 2017-12-25 DIAGNOSIS — F1010 Alcohol abuse, uncomplicated: Secondary | ICD-10-CM | POA: Diagnosis not present

## 2017-12-25 DIAGNOSIS — K219 Gastro-esophageal reflux disease without esophagitis: Secondary | ICD-10-CM | POA: Diagnosis not present

## 2017-12-25 DIAGNOSIS — E871 Hypo-osmolality and hyponatremia: Secondary | ICD-10-CM | POA: Diagnosis not present

## 2017-12-25 DIAGNOSIS — I110 Hypertensive heart disease with heart failure: Secondary | ICD-10-CM | POA: Diagnosis not present

## 2017-12-25 DIAGNOSIS — M109 Gout, unspecified: Secondary | ICD-10-CM | POA: Diagnosis not present

## 2017-12-25 DIAGNOSIS — F4323 Adjustment disorder with mixed anxiety and depressed mood: Secondary | ICD-10-CM | POA: Diagnosis not present

## 2017-12-25 DIAGNOSIS — R609 Edema, unspecified: Secondary | ICD-10-CM | POA: Diagnosis not present

## 2017-12-25 DIAGNOSIS — D649 Anemia, unspecified: Secondary | ICD-10-CM | POA: Diagnosis not present

## 2017-12-25 DIAGNOSIS — I10 Essential (primary) hypertension: Secondary | ICD-10-CM | POA: Diagnosis not present

## 2017-12-25 DIAGNOSIS — I2510 Atherosclerotic heart disease of native coronary artery without angina pectoris: Secondary | ICD-10-CM | POA: Diagnosis not present

## 2017-12-26 DIAGNOSIS — F1010 Alcohol abuse, uncomplicated: Secondary | ICD-10-CM | POA: Diagnosis not present

## 2017-12-26 DIAGNOSIS — D649 Anemia, unspecified: Secondary | ICD-10-CM | POA: Diagnosis not present

## 2017-12-26 DIAGNOSIS — I2510 Atherosclerotic heart disease of native coronary artery without angina pectoris: Secondary | ICD-10-CM | POA: Diagnosis not present

## 2017-12-26 DIAGNOSIS — G40909 Epilepsy, unspecified, not intractable, without status epilepticus: Secondary | ICD-10-CM | POA: Diagnosis not present

## 2017-12-26 DIAGNOSIS — I110 Hypertensive heart disease with heart failure: Secondary | ICD-10-CM | POA: Diagnosis not present

## 2017-12-26 DIAGNOSIS — M109 Gout, unspecified: Secondary | ICD-10-CM | POA: Diagnosis not present

## 2017-12-26 DIAGNOSIS — E871 Hypo-osmolality and hyponatremia: Secondary | ICD-10-CM | POA: Diagnosis not present

## 2017-12-26 DIAGNOSIS — K219 Gastro-esophageal reflux disease without esophagitis: Secondary | ICD-10-CM | POA: Diagnosis not present

## 2017-12-26 DIAGNOSIS — Z9181 History of falling: Secondary | ICD-10-CM | POA: Diagnosis not present

## 2017-12-26 DIAGNOSIS — I5032 Chronic diastolic (congestive) heart failure: Secondary | ICD-10-CM | POA: Diagnosis not present

## 2017-12-27 DIAGNOSIS — E871 Hypo-osmolality and hyponatremia: Secondary | ICD-10-CM | POA: Diagnosis not present

## 2017-12-28 DIAGNOSIS — E871 Hypo-osmolality and hyponatremia: Secondary | ICD-10-CM | POA: Diagnosis not present

## 2017-12-29 DIAGNOSIS — E871 Hypo-osmolality and hyponatremia: Secondary | ICD-10-CM | POA: Diagnosis not present

## 2017-12-30 DIAGNOSIS — E871 Hypo-osmolality and hyponatremia: Secondary | ICD-10-CM | POA: Diagnosis not present

## 2017-12-31 ENCOUNTER — Encounter: Payer: Medicare HMO | Admitting: Surgery

## 2017-12-31 DIAGNOSIS — E871 Hypo-osmolality and hyponatremia: Secondary | ICD-10-CM | POA: Diagnosis not present

## 2018-01-01 DIAGNOSIS — E871 Hypo-osmolality and hyponatremia: Secondary | ICD-10-CM | POA: Diagnosis not present

## 2018-01-01 DIAGNOSIS — F4323 Adjustment disorder with mixed anxiety and depressed mood: Secondary | ICD-10-CM | POA: Diagnosis not present

## 2018-01-02 ENCOUNTER — Inpatient Hospital Stay: Payer: Medicare HMO | Admitting: Physician Assistant

## 2018-01-02 DIAGNOSIS — E871 Hypo-osmolality and hyponatremia: Secondary | ICD-10-CM | POA: Diagnosis not present

## 2018-01-03 DIAGNOSIS — K219 Gastro-esophageal reflux disease without esophagitis: Secondary | ICD-10-CM | POA: Diagnosis not present

## 2018-01-03 DIAGNOSIS — F1010 Alcohol abuse, uncomplicated: Secondary | ICD-10-CM | POA: Diagnosis not present

## 2018-01-03 DIAGNOSIS — D649 Anemia, unspecified: Secondary | ICD-10-CM | POA: Diagnosis not present

## 2018-01-03 DIAGNOSIS — I110 Hypertensive heart disease with heart failure: Secondary | ICD-10-CM | POA: Diagnosis not present

## 2018-01-03 DIAGNOSIS — I5032 Chronic diastolic (congestive) heart failure: Secondary | ICD-10-CM | POA: Diagnosis not present

## 2018-01-03 DIAGNOSIS — G40909 Epilepsy, unspecified, not intractable, without status epilepticus: Secondary | ICD-10-CM | POA: Diagnosis not present

## 2018-01-03 DIAGNOSIS — I2510 Atherosclerotic heart disease of native coronary artery without angina pectoris: Secondary | ICD-10-CM | POA: Diagnosis not present

## 2018-01-03 DIAGNOSIS — Z9181 History of falling: Secondary | ICD-10-CM | POA: Diagnosis not present

## 2018-01-03 DIAGNOSIS — M109 Gout, unspecified: Secondary | ICD-10-CM | POA: Diagnosis not present

## 2018-01-03 DIAGNOSIS — E871 Hypo-osmolality and hyponatremia: Secondary | ICD-10-CM | POA: Diagnosis not present

## 2018-01-04 DIAGNOSIS — E871 Hypo-osmolality and hyponatremia: Secondary | ICD-10-CM | POA: Diagnosis not present

## 2018-01-05 DIAGNOSIS — E871 Hypo-osmolality and hyponatremia: Secondary | ICD-10-CM | POA: Diagnosis not present

## 2018-01-06 DIAGNOSIS — E871 Hypo-osmolality and hyponatremia: Secondary | ICD-10-CM | POA: Diagnosis not present

## 2018-01-07 DIAGNOSIS — D110 Benign neoplasm of parotid gland: Secondary | ICD-10-CM | POA: Diagnosis not present

## 2018-01-07 DIAGNOSIS — E871 Hypo-osmolality and hyponatremia: Secondary | ICD-10-CM | POA: Diagnosis not present

## 2018-01-07 DIAGNOSIS — K119 Disease of salivary gland, unspecified: Secondary | ICD-10-CM | POA: Diagnosis not present

## 2018-01-07 DIAGNOSIS — K116 Mucocele of salivary gland: Secondary | ICD-10-CM | POA: Diagnosis not present

## 2018-01-07 DIAGNOSIS — M109 Gout, unspecified: Secondary | ICD-10-CM | POA: Diagnosis not present

## 2018-01-07 DIAGNOSIS — K219 Gastro-esophageal reflux disease without esophagitis: Secondary | ICD-10-CM | POA: Diagnosis not present

## 2018-01-07 DIAGNOSIS — D649 Anemia, unspecified: Secondary | ICD-10-CM | POA: Diagnosis not present

## 2018-01-07 DIAGNOSIS — Z9181 History of falling: Secondary | ICD-10-CM | POA: Diagnosis not present

## 2018-01-07 DIAGNOSIS — I110 Hypertensive heart disease with heart failure: Secondary | ICD-10-CM | POA: Diagnosis not present

## 2018-01-07 DIAGNOSIS — I5032 Chronic diastolic (congestive) heart failure: Secondary | ICD-10-CM | POA: Diagnosis not present

## 2018-01-07 DIAGNOSIS — I2510 Atherosclerotic heart disease of native coronary artery without angina pectoris: Secondary | ICD-10-CM | POA: Diagnosis not present

## 2018-01-07 DIAGNOSIS — G40909 Epilepsy, unspecified, not intractable, without status epilepticus: Secondary | ICD-10-CM | POA: Diagnosis not present

## 2018-01-07 DIAGNOSIS — F1010 Alcohol abuse, uncomplicated: Secondary | ICD-10-CM | POA: Diagnosis not present

## 2018-01-08 DIAGNOSIS — D649 Anemia, unspecified: Secondary | ICD-10-CM | POA: Diagnosis not present

## 2018-01-08 DIAGNOSIS — I110 Hypertensive heart disease with heart failure: Secondary | ICD-10-CM | POA: Diagnosis not present

## 2018-01-08 DIAGNOSIS — M109 Gout, unspecified: Secondary | ICD-10-CM | POA: Diagnosis not present

## 2018-01-08 DIAGNOSIS — Z9181 History of falling: Secondary | ICD-10-CM | POA: Diagnosis not present

## 2018-01-08 DIAGNOSIS — I2510 Atherosclerotic heart disease of native coronary artery without angina pectoris: Secondary | ICD-10-CM | POA: Diagnosis not present

## 2018-01-08 DIAGNOSIS — E871 Hypo-osmolality and hyponatremia: Secondary | ICD-10-CM | POA: Diagnosis not present

## 2018-01-08 DIAGNOSIS — K219 Gastro-esophageal reflux disease without esophagitis: Secondary | ICD-10-CM | POA: Diagnosis not present

## 2018-01-08 DIAGNOSIS — I5032 Chronic diastolic (congestive) heart failure: Secondary | ICD-10-CM | POA: Diagnosis not present

## 2018-01-08 DIAGNOSIS — F1010 Alcohol abuse, uncomplicated: Secondary | ICD-10-CM | POA: Diagnosis not present

## 2018-01-08 DIAGNOSIS — G40909 Epilepsy, unspecified, not intractable, without status epilepticus: Secondary | ICD-10-CM | POA: Diagnosis not present

## 2018-01-09 DIAGNOSIS — K219 Gastro-esophageal reflux disease without esophagitis: Secondary | ICD-10-CM | POA: Diagnosis not present

## 2018-01-09 DIAGNOSIS — I110 Hypertensive heart disease with heart failure: Secondary | ICD-10-CM | POA: Diagnosis not present

## 2018-01-09 DIAGNOSIS — I5032 Chronic diastolic (congestive) heart failure: Secondary | ICD-10-CM | POA: Diagnosis not present

## 2018-01-09 DIAGNOSIS — Z9181 History of falling: Secondary | ICD-10-CM | POA: Diagnosis not present

## 2018-01-09 DIAGNOSIS — G40909 Epilepsy, unspecified, not intractable, without status epilepticus: Secondary | ICD-10-CM | POA: Diagnosis not present

## 2018-01-09 DIAGNOSIS — F1010 Alcohol abuse, uncomplicated: Secondary | ICD-10-CM | POA: Diagnosis not present

## 2018-01-09 DIAGNOSIS — D649 Anemia, unspecified: Secondary | ICD-10-CM | POA: Diagnosis not present

## 2018-01-09 DIAGNOSIS — M109 Gout, unspecified: Secondary | ICD-10-CM | POA: Diagnosis not present

## 2018-01-09 DIAGNOSIS — I2510 Atherosclerotic heart disease of native coronary artery without angina pectoris: Secondary | ICD-10-CM | POA: Diagnosis not present

## 2018-01-09 DIAGNOSIS — E871 Hypo-osmolality and hyponatremia: Secondary | ICD-10-CM | POA: Diagnosis not present

## 2018-01-10 DIAGNOSIS — K219 Gastro-esophageal reflux disease without esophagitis: Secondary | ICD-10-CM | POA: Diagnosis not present

## 2018-01-10 DIAGNOSIS — E871 Hypo-osmolality and hyponatremia: Secondary | ICD-10-CM | POA: Diagnosis not present

## 2018-01-10 DIAGNOSIS — I5032 Chronic diastolic (congestive) heart failure: Secondary | ICD-10-CM | POA: Diagnosis not present

## 2018-01-10 DIAGNOSIS — M109 Gout, unspecified: Secondary | ICD-10-CM | POA: Diagnosis not present

## 2018-01-10 DIAGNOSIS — Z9181 History of falling: Secondary | ICD-10-CM | POA: Diagnosis not present

## 2018-01-10 DIAGNOSIS — G40909 Epilepsy, unspecified, not intractable, without status epilepticus: Secondary | ICD-10-CM | POA: Diagnosis not present

## 2018-01-10 DIAGNOSIS — I2510 Atherosclerotic heart disease of native coronary artery without angina pectoris: Secondary | ICD-10-CM | POA: Diagnosis not present

## 2018-01-10 DIAGNOSIS — D649 Anemia, unspecified: Secondary | ICD-10-CM | POA: Diagnosis not present

## 2018-01-10 DIAGNOSIS — F1010 Alcohol abuse, uncomplicated: Secondary | ICD-10-CM | POA: Diagnosis not present

## 2018-01-10 DIAGNOSIS — I110 Hypertensive heart disease with heart failure: Secondary | ICD-10-CM | POA: Diagnosis not present

## 2018-01-11 DIAGNOSIS — E871 Hypo-osmolality and hyponatremia: Secondary | ICD-10-CM | POA: Diagnosis not present

## 2018-01-12 DIAGNOSIS — E871 Hypo-osmolality and hyponatremia: Secondary | ICD-10-CM | POA: Diagnosis not present

## 2018-01-13 DIAGNOSIS — E871 Hypo-osmolality and hyponatremia: Secondary | ICD-10-CM | POA: Diagnosis not present

## 2018-01-14 DIAGNOSIS — E871 Hypo-osmolality and hyponatremia: Secondary | ICD-10-CM | POA: Diagnosis not present

## 2018-01-15 DIAGNOSIS — Z9181 History of falling: Secondary | ICD-10-CM | POA: Diagnosis not present

## 2018-01-15 DIAGNOSIS — K219 Gastro-esophageal reflux disease without esophagitis: Secondary | ICD-10-CM | POA: Diagnosis not present

## 2018-01-15 DIAGNOSIS — I110 Hypertensive heart disease with heart failure: Secondary | ICD-10-CM | POA: Diagnosis not present

## 2018-01-15 DIAGNOSIS — F1010 Alcohol abuse, uncomplicated: Secondary | ICD-10-CM | POA: Diagnosis not present

## 2018-01-15 DIAGNOSIS — D649 Anemia, unspecified: Secondary | ICD-10-CM | POA: Diagnosis not present

## 2018-01-15 DIAGNOSIS — E871 Hypo-osmolality and hyponatremia: Secondary | ICD-10-CM | POA: Diagnosis not present

## 2018-01-15 DIAGNOSIS — I5032 Chronic diastolic (congestive) heart failure: Secondary | ICD-10-CM | POA: Diagnosis not present

## 2018-01-15 DIAGNOSIS — G40909 Epilepsy, unspecified, not intractable, without status epilepticus: Secondary | ICD-10-CM | POA: Diagnosis not present

## 2018-01-15 DIAGNOSIS — R609 Edema, unspecified: Secondary | ICD-10-CM | POA: Diagnosis not present

## 2018-01-15 DIAGNOSIS — I2510 Atherosclerotic heart disease of native coronary artery without angina pectoris: Secondary | ICD-10-CM | POA: Diagnosis not present

## 2018-01-15 DIAGNOSIS — M109 Gout, unspecified: Secondary | ICD-10-CM | POA: Diagnosis not present

## 2018-01-15 DIAGNOSIS — I10 Essential (primary) hypertension: Secondary | ICD-10-CM | POA: Diagnosis not present

## 2018-01-16 DIAGNOSIS — F4323 Adjustment disorder with mixed anxiety and depressed mood: Secondary | ICD-10-CM | POA: Diagnosis not present

## 2018-01-16 DIAGNOSIS — E871 Hypo-osmolality and hyponatremia: Secondary | ICD-10-CM | POA: Diagnosis not present

## 2018-01-17 DIAGNOSIS — I5032 Chronic diastolic (congestive) heart failure: Secondary | ICD-10-CM | POA: Diagnosis not present

## 2018-01-17 DIAGNOSIS — I110 Hypertensive heart disease with heart failure: Secondary | ICD-10-CM | POA: Diagnosis not present

## 2018-01-17 DIAGNOSIS — Z79899 Other long term (current) drug therapy: Secondary | ICD-10-CM | POA: Diagnosis not present

## 2018-01-17 DIAGNOSIS — Z9181 History of falling: Secondary | ICD-10-CM | POA: Diagnosis not present

## 2018-01-17 DIAGNOSIS — M109 Gout, unspecified: Secondary | ICD-10-CM | POA: Diagnosis not present

## 2018-01-17 DIAGNOSIS — G40909 Epilepsy, unspecified, not intractable, without status epilepticus: Secondary | ICD-10-CM | POA: Diagnosis not present

## 2018-01-17 DIAGNOSIS — I2510 Atherosclerotic heart disease of native coronary artery without angina pectoris: Secondary | ICD-10-CM | POA: Diagnosis not present

## 2018-01-17 DIAGNOSIS — K219 Gastro-esophageal reflux disease without esophagitis: Secondary | ICD-10-CM | POA: Diagnosis not present

## 2018-01-17 DIAGNOSIS — F1010 Alcohol abuse, uncomplicated: Secondary | ICD-10-CM | POA: Diagnosis not present

## 2018-01-17 DIAGNOSIS — E871 Hypo-osmolality and hyponatremia: Secondary | ICD-10-CM | POA: Diagnosis not present

## 2018-01-17 DIAGNOSIS — D649 Anemia, unspecified: Secondary | ICD-10-CM | POA: Diagnosis not present

## 2018-01-18 DIAGNOSIS — K219 Gastro-esophageal reflux disease without esophagitis: Secondary | ICD-10-CM | POA: Diagnosis not present

## 2018-01-18 DIAGNOSIS — I2510 Atherosclerotic heart disease of native coronary artery without angina pectoris: Secondary | ICD-10-CM | POA: Diagnosis not present

## 2018-01-18 DIAGNOSIS — I110 Hypertensive heart disease with heart failure: Secondary | ICD-10-CM | POA: Diagnosis not present

## 2018-01-18 DIAGNOSIS — Z9181 History of falling: Secondary | ICD-10-CM | POA: Diagnosis not present

## 2018-01-18 DIAGNOSIS — I5032 Chronic diastolic (congestive) heart failure: Secondary | ICD-10-CM | POA: Diagnosis not present

## 2018-01-18 DIAGNOSIS — G40909 Epilepsy, unspecified, not intractable, without status epilepticus: Secondary | ICD-10-CM | POA: Diagnosis not present

## 2018-01-18 DIAGNOSIS — D649 Anemia, unspecified: Secondary | ICD-10-CM | POA: Diagnosis not present

## 2018-01-18 DIAGNOSIS — F1010 Alcohol abuse, uncomplicated: Secondary | ICD-10-CM | POA: Diagnosis not present

## 2018-01-18 DIAGNOSIS — E871 Hypo-osmolality and hyponatremia: Secondary | ICD-10-CM | POA: Diagnosis not present

## 2018-01-18 DIAGNOSIS — M109 Gout, unspecified: Secondary | ICD-10-CM | POA: Diagnosis not present

## 2018-01-19 DIAGNOSIS — E871 Hypo-osmolality and hyponatremia: Secondary | ICD-10-CM | POA: Diagnosis not present

## 2018-01-20 DIAGNOSIS — E871 Hypo-osmolality and hyponatremia: Secondary | ICD-10-CM | POA: Diagnosis not present

## 2018-01-21 DIAGNOSIS — F1010 Alcohol abuse, uncomplicated: Secondary | ICD-10-CM | POA: Diagnosis not present

## 2018-01-21 DIAGNOSIS — G40909 Epilepsy, unspecified, not intractable, without status epilepticus: Secondary | ICD-10-CM | POA: Diagnosis not present

## 2018-01-21 DIAGNOSIS — I110 Hypertensive heart disease with heart failure: Secondary | ICD-10-CM | POA: Diagnosis not present

## 2018-01-21 DIAGNOSIS — D649 Anemia, unspecified: Secondary | ICD-10-CM | POA: Diagnosis not present

## 2018-01-21 DIAGNOSIS — I2510 Atherosclerotic heart disease of native coronary artery without angina pectoris: Secondary | ICD-10-CM | POA: Diagnosis not present

## 2018-01-21 DIAGNOSIS — E871 Hypo-osmolality and hyponatremia: Secondary | ICD-10-CM | POA: Diagnosis not present

## 2018-01-21 DIAGNOSIS — Z9181 History of falling: Secondary | ICD-10-CM | POA: Diagnosis not present

## 2018-01-21 DIAGNOSIS — M109 Gout, unspecified: Secondary | ICD-10-CM | POA: Diagnosis not present

## 2018-01-21 DIAGNOSIS — K219 Gastro-esophageal reflux disease without esophagitis: Secondary | ICD-10-CM | POA: Diagnosis not present

## 2018-01-21 DIAGNOSIS — I5032 Chronic diastolic (congestive) heart failure: Secondary | ICD-10-CM | POA: Diagnosis not present

## 2018-01-22 ENCOUNTER — Encounter: Payer: Medicare HMO | Admitting: *Deleted

## 2018-01-22 ENCOUNTER — Telehealth: Payer: Self-pay | Admitting: Cardiology

## 2018-01-22 NOTE — Telephone Encounter (Signed)
Attempted to confirm remote transmission with pt. No answer and was unable to leave a message.   

## 2018-01-23 ENCOUNTER — Other Ambulatory Visit: Payer: Self-pay | Admitting: Family Medicine

## 2018-01-23 ENCOUNTER — Other Ambulatory Visit: Payer: Self-pay | Admitting: Neurology

## 2018-01-23 ENCOUNTER — Other Ambulatory Visit: Payer: Self-pay | Admitting: Physician Assistant

## 2018-01-23 DIAGNOSIS — R569 Unspecified convulsions: Secondary | ICD-10-CM

## 2018-01-23 NOTE — Telephone Encounter (Signed)
Last OV 08/09/2016 Last refilled 06/29/2017 No showed 08/10/2017 No new OV

## 2018-01-23 NOTE — Telephone Encounter (Signed)
I have not seen him since October 2017. Pls let him know he needs to come once a year if we will do refills, otherwise he can ask PCP to do for him. If he makes appt, ok to send refills until then. Thanks

## 2018-01-24 ENCOUNTER — Encounter: Payer: Self-pay | Admitting: Cardiology

## 2018-01-25 ENCOUNTER — Encounter: Payer: Self-pay | Admitting: Cardiology

## 2018-01-25 DIAGNOSIS — I5032 Chronic diastolic (congestive) heart failure: Secondary | ICD-10-CM | POA: Diagnosis not present

## 2018-01-25 DIAGNOSIS — D649 Anemia, unspecified: Secondary | ICD-10-CM | POA: Diagnosis not present

## 2018-01-25 DIAGNOSIS — I110 Hypertensive heart disease with heart failure: Secondary | ICD-10-CM | POA: Diagnosis not present

## 2018-01-25 DIAGNOSIS — M109 Gout, unspecified: Secondary | ICD-10-CM | POA: Diagnosis not present

## 2018-01-25 DIAGNOSIS — F1010 Alcohol abuse, uncomplicated: Secondary | ICD-10-CM | POA: Diagnosis not present

## 2018-01-25 DIAGNOSIS — I2510 Atherosclerotic heart disease of native coronary artery without angina pectoris: Secondary | ICD-10-CM | POA: Diagnosis not present

## 2018-01-25 DIAGNOSIS — Z9181 History of falling: Secondary | ICD-10-CM | POA: Diagnosis not present

## 2018-01-25 DIAGNOSIS — G40909 Epilepsy, unspecified, not intractable, without status epilepticus: Secondary | ICD-10-CM | POA: Diagnosis not present

## 2018-01-25 DIAGNOSIS — K219 Gastro-esophageal reflux disease without esophagitis: Secondary | ICD-10-CM | POA: Diagnosis not present

## 2018-01-28 ENCOUNTER — Encounter: Payer: Self-pay | Admitting: Physician Assistant

## 2018-01-28 ENCOUNTER — Ambulatory Visit (INDEPENDENT_AMBULATORY_CARE_PROVIDER_SITE_OTHER): Payer: Medicare HMO | Admitting: Physician Assistant

## 2018-01-28 VITALS — BP 136/62 | HR 88 | Temp 97.4°F | Resp 14 | Wt 192.8 lb

## 2018-01-28 DIAGNOSIS — I6529 Occlusion and stenosis of unspecified carotid artery: Secondary | ICD-10-CM | POA: Diagnosis not present

## 2018-01-28 NOTE — Progress Notes (Signed)
Patient ID: Francisco [name] MRN: [id number], DOB: [birth year], 72 y.o. Date of Encounter: 01/28/2018, 9:19 AM    Chief Complaint:  Reschedule test for carotid artery ultrasound    HPI: 72 y.o. year old male presents for above.   He reports that I had scheduled him for carotid artery ultrasound but at the time he was supposed to be going for that test he ended up in the hospitals that he missed that appointment.  Says that the mass in his parotid gland was found to be benign.  However when they were doing the test for that it did show that he had blockages in his carotid arteries and so he knows he does need to have that follow-up ultrasound test.  Has no other specific concerns to address today.     Home Meds:   Outpatient Medications Prior to Visit  Medication Sig Dispense Refill  . allopurinol (ZYLOPRIM) 300 MG tablet TAKE 1 TABLET(300 MG) BY MOUTH DAILY 90 tablet 0  . atorvastatin (LIPITOR) 20 MG tablet Take 1 tablet (20 mg total) by mouth daily. 90 tablet 3  . clotrimazole-betamethasone (LOTRISONE) cream APPLY EXTERNALLY TO THE AFFECTED AREA TWICE DAILY (Patient not taking: Reported on 11/30/2017) 45 g 0  . colchicine 0.6 MG tablet Take 1 tablet (0.6 mg total) by mouth 2 (two) times daily as needed (gout flare). Take twice daily for 2 weeks, then use as needed for gout flare up 60 tablet 0  . ferrous sulfate 325 (65 FE) MG tablet Take 1 tablet (325 mg total) by mouth daily.    . folic acid (FOLVITE) 1 MG tablet Take 1 tablet (1 mg total) by mouth daily.    . furosemide (LASIX) 20 MG tablet TAKE 1 TABLET BY MOUTH DAILY 90 tablet 0  . levETIRAcetam (KEPPRA XR) 500 MG 24 hr tablet TAKE 3 TABLETS BY MOUTH DAILY 270 tablet 1  . metoprolol tartrate (LOPRESSOR) 50 MG tablet Take 1 tablet (50 mg total) by mouth 2 (two) times daily. 60 tablet 6  . Multiple Vitamin (MULTIVITAMIN WITH MINERALS) TABS tablet Take 1 tablet by mouth daily.    . naproxen sodium (ALEVE) 220 MG tablet Take 220 mg  by mouth 2 (two) times daily as needed (pain/ headache).    . pantoprazole (PROTONIX) 40 MG tablet TAKE 1 TABLET BY MOUTH TWICE DAILY BEFORE A MEAL 180 tablet 0  . polyethylene glycol (MIRALAX / GLYCOLAX) packet Take 17 g by mouth 2 (two) times daily. 14 each 0  . potassium chloride (K-DUR) 10 MEQ tablet TAKE 1 TABLET(10 MEQ) BY MOUTH DAILY 90 tablet 0  . tamsulosin (FLOMAX) 0.4 MG CAPS capsule TAKE 1 CAPSULE(0.4 MG) BY MOUTH DAILY 90 capsule 0  . thiamine 100 MG tablet Take 1 tablet (100 mg total) by mouth daily.     No facility-administered medications prior to visit.     Allergies:  Allergies  Allergen Reactions  . Gluten Meal Other (See Comments)    Celiac Disease - pt states on 08/14/16 that he has been eating gluten products for about 3 months with no reaction      Review of Systems: See HPI for pertinent ROS. All other ROS negative.    Physical Exam: Blood pressure 136/62, pulse 88, temperature (!) 97.4 F (36.3 C), temperature source Oral, resp. rate 14, weight 87.5 kg (192 lb 12.8 oz), SpO2 97 %., Body mass index is 28.47 kg/m. General:  WM. Appears in no acute distress. Neck: Supple. No thyromegaly. No  lymphadenopathy.  Bilateral carotid artery bruits. Lungs: Clear bilaterally to auscultation without wheezes, rales, or rhonchi. Breathing is unlabored. Heart: Regular rhythm. IV/VI murmur. Msk:  Strength and tone normal for age. Extremities/Skin: Warm and dry. Neuro: Alert and oriented X 3. Moves all extremities spontaneously. Gait is normal. CNII-XII grossly in tact. Psych:  Responds to questions appropriately with a normal affect.     ASSESSMENT AND PLAN:  72 y.o. year old male with  1. Stenosis of carotid artery, unspecified laterality Computer there is a carotid Doppler documented as 11/23/17.  However there is no report with findings.  Patient tells me that he did not go for that test.  He tells me that "it all got to be a mass "because he ended up at the hospital.   We will reschedule/reorder carotid artery Doppler.  Patient says it was not performed 11/23/17 and this explains why there are no findings in that actual report. - VAS US CAROTID; Future   Signed, [address] [place], [place], [facility] 01/28/2018 9:19 AM

## 2018-02-01 ENCOUNTER — Ambulatory Visit (HOSPITAL_COMMUNITY)
Admission: RE | Admit: 2018-02-01 | Payer: Medicare HMO | Source: Ambulatory Visit | Attending: Physician Assistant | Admitting: Physician Assistant

## 2018-02-05 ENCOUNTER — Encounter: Payer: Self-pay | Admitting: Cardiology

## 2018-02-05 ENCOUNTER — Encounter: Payer: Medicare HMO | Admitting: Cardiology

## 2018-02-05 ENCOUNTER — Ambulatory Visit (INDEPENDENT_AMBULATORY_CARE_PROVIDER_SITE_OTHER): Payer: Medicare HMO | Admitting: Cardiology

## 2018-02-05 VITALS — BP 144/78 | HR 68 | Ht 69.0 in | Wt 192.4 lb

## 2018-02-05 DIAGNOSIS — I472 Ventricular tachycardia, unspecified: Secondary | ICD-10-CM

## 2018-02-05 DIAGNOSIS — I442 Atrioventricular block, complete: Secondary | ICD-10-CM

## 2018-02-05 DIAGNOSIS — I10 Essential (primary) hypertension: Secondary | ICD-10-CM

## 2018-02-05 MED ORDER — METOPROLOL TARTRATE 50 MG PO TABS: 75.0000 mg | ORAL_TABLET | Freq: Two times a day (BID) | ORAL | 6 refills | Status: DC

## 2018-02-05 NOTE — Addendum Note (Signed)
Addended by: Stanton Kidney on: 02/05/2018 12:55 PM   Modules accepted: Orders

## 2018-02-05 NOTE — Progress Notes (Signed)
Electrophysiology Office Note   Date:  02/05/2018   ID:  Francisco, [name] 26-Apr-1946, MRN [id number]  PCP:  Rennis Golden  Primary Electrophysiologist:  Will Meredith Leeds, MD    Chief Complaint  Patient presents with  . Pacemaker Check    Complete heart block     History of Present Illness: Francisco [name] is a 72 y.o. male who presents today for electrophysiology evaluation.    He presented to the hospital in September for the evaluation of seizures. At that time he was found to be in complete heart block with a ventricular rate in the 30s and altered mental mental status requiring transcutaneous pacing. He had an urgent temporary pacemaker placed which dislodged and ultimately had a dual chamber pacemaker placed.   Today, denies symptoms of palpitations, chest pain, shortness of breath, orthopnea, PND, lower extremity edema, claudication, dizziness, presyncope, syncope, bleeding, or neurologic sequela. The patient is tolerating medications without difficulties.  Overall he is doing well.  He does not have much in the way of complaints.  He has recently left his assisted living.  He is drinking 3-4 beers a day which she says is down from 8-12.    Past Medical History:  Diagnosis Date  . Anemia   . Celiac disease   . GERD (gastroesophageal reflux disease)   . Heart murmur   . Hypertension   . Multiple duodenal ulcers   . Shortness of breath   . Symptomatic Bradycardia    a. 07/2015 s/p MDT Advisa L DC PPM (Ser #: [id number]).  . 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 CARDIAC CATHETERIZATION N/A 07/08/2015   Procedure: Left Heart Cath and Coronary Angiography;  Surgeon: Troy Sine, MD;  Location: [facility];  Service: Cardiovascular;  Laterality: N/A;  . CARDIAC CATHETERIZATION N/A 07/08/2015   Procedure: Temporary Pacemaker;  Surgeon: Troy Sine, MD;  Location: [facility];  Service: Cardiovascular;  Laterality: N/A;  . CARDIAC CATHETERIZATION N/A 07/09/2015   Procedure: Temporary Pacemaker;  Surgeon: Troy Sine, MD;  Location: [facility];  Service: Cardiovascular;  Laterality: N/A;  . EP IMPLANTABLE DEVICE N/A 07/09/2015   Procedure: Pacemaker Implant;  Surgeon: Will Meredith Leeds, MD;  Location: [facility];  Service: Cardiovascular;  Laterality: N/A;  . ESOPHAGOGASTRODUODENOSCOPY  07/03/2012   Procedure: ESOPHAGOGASTRODUODENOSCOPY (EGD);  Surgeon: Winfield Cunas., MD;  Location: [facility];  Service: Endoscopy;  Laterality: N/A;  . HEMORRHOID SURGERY    . HERNIA REPAIR    . MOLE REMOVAL       Current Outpatient Medications  Medication Sig Dispense Refill  . allopurinol (ZYLOPRIM) 300 MG tablet TAKE 1 TABLET(300 MG) BY MOUTH DAILY 90 tablet 0  . atorvastatin (LIPITOR) 20 MG tablet Take 1 tablet (20 mg total) by mouth daily. 90 tablet 3  . clotrimazole-betamethasone (LOTRISONE) cream APPLY EXTERNALLY TO THE AFFECTED AREA TWICE DAILY 45 g 0  . colchicine 0.6 MG tablet Take 1 tablet (0.6 mg total) by mouth 2 (two) times daily as needed (gout flare). Take twice daily for 2 weeks, then use as needed for gout flare up 60 tablet 0  . ferrous sulfate 325 (65 FE) MG tablet Take 1 tablet (325 mg total) by mouth daily.    . folic acid (FOLVITE) 1 MG tablet Take 1 tablet (1 mg total) by mouth daily.    Marland Kitchen  furosemide (LASIX) 20 MG tablet TAKE 1 TABLET BY MOUTH DAILY 90 tablet 0  . levETIRAcetam (KEPPRA XR) 500 MG 24 hr tablet TAKE 3 TABLETS BY MOUTH DAILY 270 tablet 1  . lisinopril (PRINIVIL,ZESTRIL) 20 MG tablet Take 20 mg by mouth daily.  3  . metoprolol tartrate (LOPRESSOR) 50 MG tablet Take 1 tablet (50 mg total) by mouth 2 (two) times daily. 60 tablet 6  . Multiple Vitamin (MULTIVITAMIN WITH MINERALS) TABS tablet Take 1 tablet by mouth daily.    . naproxen sodium (ALEVE) 220 MG tablet Take 220 mg by mouth 2 (two) times daily as  needed (pain/ headache).    . pantoprazole (PROTONIX) 40 MG tablet TAKE 1 TABLET BY MOUTH TWICE DAILY BEFORE A MEAL 180 tablet 0  . polyethylene glycol (MIRALAX / GLYCOLAX) packet Take 17 g by mouth 2 (two) times daily. 14 each 0  . potassium chloride (K-DUR) 10 MEQ tablet TAKE 1 TABLET(10 MEQ) BY MOUTH DAILY 90 tablet 0  . tamsulosin (FLOMAX) 0.4 MG CAPS capsule TAKE 1 CAPSULE(0.4 MG) BY MOUTH DAILY 90 capsule 0  . thiamine 100 MG tablet Take 1 tablet (100 mg total) by mouth daily.     No current facility-administered medications for this visit.     Allergies:   Gluten meal   Social History:  The patient  reports that he has quit smoking. His smoking use included cigarettes. He has a 25.00 pack-year smoking history. He has quit using smokeless tobacco. He reports that he drinks alcohol. He reports that he does not use drugs.   Family History:  The patient's family history includes Hodgkin's lymphoma in his father.   ROS:  Please see the history of present illness.   Otherwise, review of systems is positive for none.   All other systems are reviewed and negative.   PHYSICAL EXAM: VS:  BP (!) 144/78   Pulse 68   Ht 5' 9"  (1.753 m)   Wt 192 lb 6.4 oz (87.3 kg)   SpO2 97%   BMI 28.41 kg/m  , BMI Body mass index is 28.41 kg/m. GEN: Well nourished, well developed, in no acute distress  HEENT: normal  Neck: no JVD, carotid bruits, or masses Cardiac: RRR; no murmurs, rubs, or gallops,no edema  Respiratory:  clear to auscultation bilaterally, normal work of breathing GI: soft, nontender, nondistended, + BS MS: no deformity or atrophy  Skin: warm and dry, device site well healed Neuro:  Strength and sensation are intact Psych: euthymic mood, full affect  EKG:  EKG is not ordered today. Personal review of the ekg ordered 12/01/17 shows A sense, V pacing  Personal review of the device interrogation today. Results in [facility]: 11/30/2017: TSH 1.372 12/01/2017: ALT 27;  Magnesium 1.7 12/03/2017: Hemoglobin 12.1; Platelets 220 12/04/2017: BUN 12; Creatinine, Ser 0.95; Potassium 3.5; Sodium 129    Lipid Panel     Component Value Date/Time   CHOL 154 02/22/2017 0944   TRIG 67 02/22/2017 0944   HDL 69 02/22/2017 0944   CHOLHDL 2.2 02/22/2017 0944   VLDL 13 02/22/2017 0944   LDLCALC 72 02/22/2017 0944     Wt Readings from Last 3 Encounters:  02/05/18 192 lb 6.4 oz (87.3 kg)  01/28/18 192 lb 12.8 oz (87.5 kg)  12/05/17 189 lb 6 oz (85.9 kg)      Other studies Reviewed: Additional studies/ records that were reviewed today include: 07/08/15 Cath Review of the above records today demonstrates:  Complete  heart block with ventricular rates in the low 30s requiring insertion of a transvenous pacemaker.  Normal coronary arteries.  Hyperdynamic LV function with an ejection fraction of a aproximately 65%.    ASSESSMENT AND PLAN:  1.  Complete heart block: Status post Medtronic dual-chamber pacemaker.  Device functioning appropriately.  No changes at this time.    2. Hypertension: I will be elevated today but has been well controlled in the past.  No changes.   3. Ventricular high rate episodes: Seen on pacemaker interrogation.  Will increase metoprolol to 75 mg twice a day.  Current medicines are reviewed at length with the patient today.   The patient does not have concerns regarding his medicines.  The following changes were made today: Increase metoprolol  Labs/ tests ordered today include:  No orders of the defined types were placed in this encounter.    Disposition:   FU with Will Camnitz 6 months  Signed, Will Meredith Leeds, MD  02/05/2018 12:46 PM     [place] [address] [phone] (office) [phone] (fax)

## 2018-02-05 NOTE — Patient Instructions (Addendum)
Medication Instructions:  Your physician has recommended you make the following change in your medication:  1. INCREASE Metoprolol to 75 mg twice daily  Labwork: None ordered  Testing/Procedures: None ordered  Follow-Up: Please schedule an appointment with your primary care doctor.    Your physician recommends that you schedule a follow-up appointment in: 3 months with device clinic for a device check.  Your physician wants you to follow-up in: 6 months with Dr. Curt Bears.  You will receive a reminder letter in the mail two months in advance. If you don't receive a letter, please call our office to schedule the follow-up appointment.  * If you need a refill on your cardiac medications before your next appointment, please call your pharmacy.   *Please note that any paperwork needing to be filled out by the provider will need to be addressed at the front desk prior to seeing the provider. Please note that any FMLA, disability or other documents regarding health condition is subject to a $25.00 charge that must be received prior to completion of paperwork in the form of a money order or check.  Thank you for choosing [facility]!!   Trinidad Curet, RN [phone]  Any Other Special Instructions Will Be Listed Below (If Applicable).

## 2018-02-06 ENCOUNTER — Other Ambulatory Visit: Payer: Self-pay | Admitting: Neurology

## 2018-02-06 DIAGNOSIS — R569 Unspecified convulsions: Secondary | ICD-10-CM

## 2018-02-11 ENCOUNTER — Other Ambulatory Visit: Payer: Self-pay

## 2018-02-11 ENCOUNTER — Telehealth: Payer: Self-pay

## 2018-02-11 DIAGNOSIS — I6529 Occlusion and stenosis of unspecified carotid artery: Secondary | ICD-10-CM

## 2018-02-11 NOTE — Telephone Encounter (Signed)
Agree for patient to follow-up with Dr. Trula Slade to discuss carotid artery ultrasound.

## 2018-02-11 NOTE — Telephone Encounter (Signed)
Patient called inquiring about his referral to vascular surgeon. Another U/S for carotid artery was placed 3/25  however  that was done on 1/18 as well as results provided to Dr Buelah Manis. Would you like this order canceled? Patient was advised to call Dr Stephens Shire office to schedule an appointment

## 2018-02-14 ENCOUNTER — Inpatient Hospital Stay (HOSPITAL_COMMUNITY): Admission: RE | Admit: 2018-02-14 | Payer: Medicare HMO | Source: Ambulatory Visit

## 2018-02-25 ENCOUNTER — Other Ambulatory Visit: Payer: Self-pay | Admitting: Physician Assistant

## 2018-03-20 ENCOUNTER — Other Ambulatory Visit: Payer: Self-pay

## 2018-03-20 ENCOUNTER — Encounter

## 2018-03-20 ENCOUNTER — Ambulatory Visit (INDEPENDENT_AMBULATORY_CARE_PROVIDER_SITE_OTHER): Payer: Medicare HMO | Admitting: Vascular Surgery

## 2018-03-20 ENCOUNTER — Encounter: Payer: Self-pay | Admitting: Vascular Surgery

## 2018-03-20 DIAGNOSIS — I739 Peripheral vascular disease, unspecified: Principal | ICD-10-CM

## 2018-03-20 DIAGNOSIS — I779 Disorder of arteries and arterioles, unspecified: Secondary | ICD-10-CM

## 2018-03-20 NOTE — Progress Notes (Signed)
New Carotid Patient  Requested by:  Alycia Rossetti, MD [address] [place], [place] [address]  Reason for consultation: right carotid occlusion   History of Present Illness   Francisco [name] is a 72 y.o. (09/10/1946) male who presents with chief complaint: "Carotid artery is clogged".  Previous carotid studies demonstrated: RICA occluded stenosis, LICA <03% stenosis.  Patient has reports history of TIA after PPM placement.  The patient reported had expressive aphasia which is improved at this point.  The patient has never had amaurosis fugax or monocular blindness.  The patient has never had facial drooping or hemiplegia.  The patient has never had receptive aphasia.     The patient's risks factors for carotid disease include: HTN, e-cig  Past Medical History:  Diagnosis Date  . Anemia   . Celiac disease   . GERD (gastroesophageal reflux disease)   . Heart murmur   . Hypertension   . Multiple duodenal ulcers   . Shortness of breath   . Symptomatic Bradycardia    a. 07/2015 s/p MDT Advisa L DC PPM (Ser #: [id number]).  . 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 CARDIAC CATHETERIZATION N/A 07/08/2015   Procedure: Left Heart Cath and Coronary Angiography;  Surgeon: Troy Sine, MD;  Location: [facility];  Service: Cardiovascular;  Laterality: N/A;  . CARDIAC CATHETERIZATION N/A 07/08/2015   Procedure: Temporary Pacemaker;  Surgeon: Troy Sine, MD;  Location: [facility];  Service: Cardiovascular;  Laterality: N/A;  . CARDIAC CATHETERIZATION N/A 07/09/2015   Procedure: Temporary Pacemaker;  Surgeon: Troy Sine, MD;  Location: [facility];  Service: Cardiovascular;  Laterality: N/A;  . EP IMPLANTABLE DEVICE N/A 07/09/2015   Procedure: Pacemaker Implant;  Surgeon: Will Meredith Leeds, MD;  Location: [facility];  Service: Cardiovascular;  Laterality: N/A;  .  ESOPHAGOGASTRODUODENOSCOPY  07/03/2012   Procedure: ESOPHAGOGASTRODUODENOSCOPY (EGD);  Surgeon: Winfield Cunas., MD;  Location: [facility];  Service: Endoscopy;  Laterality: N/A;  . HEMORRHOID SURGERY    . HERNIA REPAIR    . MOLE REMOVAL      Social History   Socioeconomic History  . Marital status: Single    Spouse name: Not on file  . Number of children: Not on file  . Years of education: Not on file  . Highest education level: Not on file  Occupational History  . Not on file  Social Needs  . Financial resource strain: Not on file  . Food insecurity:    Worry: Not on file    Inability: Not on file  . Transportation needs:    Medical: Not on file    Non-medical: Not on file  Tobacco Use  . Smoking status: Former Smoker    Packs/day: 0.50    Years: 50.00    Pack years: 25.00    Types: Cigarettes  . Smokeless tobacco: Former Network engineer and Sexual Activity  . Alcohol use: Yes    Alcohol/week: 0.0 oz    Types: 1 - 2 Glasses of wine per week    Comment: ocassional  . Drug use: No  . Sexual activity: Never  Lifestyle  . Physical activity:    Days per week: Not on file    Minutes per session: Not on file  . Stress: Not on file  Relationships  . Social connections:    Talks  on phone: Not on file    Gets together: Not on file    Attends religious service: Not on file    Active member of club or organization: Not on file    Attends meetings of clubs or organizations: Not on file    Relationship status: Not on file  . Intimate partner violence:    Fear of current or ex partner: Not on file    Emotionally abused: Not on file    Physically abused: Not on file    Forced sexual activity: Not on file  Other Topics Concern  . Not on file  Social History Narrative   Lives alone. --as of 11/2013   Smokes e-cig. Has nicotine in it. No real cigarette any more    Family History  Problem Relation Age of Onset  . Hodgkin's lymphoma Father     Current Outpatient  Medications  Medication Sig Dispense Refill  . allopurinol (ZYLOPRIM) 300 MG tablet TAKE 1 TABLET(300 MG) BY MOUTH DAILY 90 tablet 0  . atorvastatin (LIPITOR) 20 MG tablet Take 1 tablet (20 mg total) by mouth daily. 90 tablet 3  . clotrimazole-betamethasone (LOTRISONE) cream APPLY EXTERNALLY TO THE AFFECTED AREA TWICE DAILY 45 g 0  . ferrous sulfate 325 (65 FE) MG tablet Take 1 tablet (325 mg total) by mouth daily.    . furosemide (LASIX) 20 MG tablet TAKE 1 TABLET BY MOUTH DAILY 90 tablet 0  . levETIRAcetam (KEPPRA XR) 500 MG 24 hr tablet TAKE 3 TABLETS BY MOUTH DAILY 270 tablet 1  . lisinopril (PRINIVIL,ZESTRIL) 20 MG tablet Take 20 mg by mouth daily.  3  . metoprolol tartrate (LOPRESSOR) 50 MG tablet Take 1.5 tablets (75 mg total) by mouth 2 (two) times daily. 90 tablet 6  . naproxen sodium (ALEVE) 220 MG tablet Take 220 mg by mouth 2 (two) times daily as needed (pain/ headache).    . pantoprazole (PROTONIX) 40 MG tablet TAKE 1 TABLET BY MOUTH TWICE DAILY BEFORE A MEAL 180 tablet 0  . polyethylene glycol (MIRALAX / GLYCOLAX) packet Take 17 g by mouth 2 (two) times daily. 14 each 0  . potassium chloride (K-DUR) 10 MEQ tablet TAKE 1 TABLET(10 MEQ) BY MOUTH DAILY 90 tablet 0  . tamsulosin (FLOMAX) 0.4 MG CAPS capsule TAKE 1 CAPSULE(0.4 MG) BY MOUTH DAILY 90 capsule 0  . thiamine 100 MG tablet Take 1 tablet (100 mg total) by mouth daily.    . colchicine 0.6 MG tablet Take 1 tablet (0.6 mg total) by mouth 2 (two) times daily as needed (gout flare). Take twice daily for 2 weeks, then use as needed for gout flare up (Patient not taking: Reported on 03/20/2018) 60 tablet 0  . folic acid (FOLVITE) 1 MG tablet Take 1 tablet (1 mg total) by mouth daily. (Patient not taking: Reported on 03/20/2018)    . Multiple Vitamin (MULTIVITAMIN WITH MINERALS) TABS tablet Take 1 tablet by mouth daily. (Patient not taking: Reported on 03/20/2018)     No current facility-administered medications for this visit.      Allergies  Allergen Reactions  . Gluten Meal Other (See Comments)    Celiac Disease - pt states on 08/14/16 that he has been eating gluten products for about 3 months with no reaction    REVIEW OF SYSTEMS (negative unless checked):   Cardiac:  []  Chest pain or chest pressure? []  Shortness of breath upon activity? []  Shortness of breath when lying flat? []  Irregular heart rhythm?  Vascular:  []   Pain in calf, thigh, or hip brought on by walking? []  Pain in feet at night that wakes you up from your sleep? []  Blood clot in your veins? []  Leg swelling?  Pulmonary:  []  Oxygen at home? []  Productive cough? []  Wheezing?  Neurologic:  []  Sudden weakness in arms or legs? []  Sudden numbness in arms or legs? []  Sudden onset of difficult speaking or slurred speech? []  Temporary loss of vision in one eye? []  Problems with dizziness?  Gastrointestinal:  []  Blood in stool? []  Vomited blood?  Genitourinary:  []  Burning when urinating? []  Blood in urine?  Psychiatric:  []  Major depression  Hematologic:  []  Bleeding problems? []  Problems with blood clotting?  Dermatologic:  []  Rashes or ulcers?  Constitutional:  []  Fever or chills?  Ear/Nose/Throat:  []  Change in hearing? []  Nose bleeds? []  Sore throat?  Musculoskeletal:  []  Back pain? []  Joint pain? []  Muscle pain?   For VQI Use Only   PRE-ADM LIVING Home  AMB STATUS Ambulatory  CAD Sx None  PRIOR CHF None  STRESS TEST No    Physical Examination     Vitals:   03/20/18 1119 03/20/18 1123  BP: 113/64 111/70  Pulse: (!) 59 (!) 59  Resp: 20   Temp: (!) 97.3 F (36.3 C)   TempSrc: Oral   SpO2: 93%   Weight: 186 lb (84.4 kg)   Height: 5' 8"  (1.727 m)    Body mass index is 28.28 kg/m.  General Alert, O x 3, WD, unkempt  Head [place]/AT,    Ear/Nose/ Throat Hearing grossly intact, nares without erythema or drainage, oropharynx without Erythema or Exudate, Mallampati score: 3,   Eyes PERRLA, EOMI,     Neck Supple, mid-line trachea,    Pulmonary Sym exp, good B air movt, CTA B  Cardiac RRR, Nl S1, S2, no Murmurs, No rubs, No S3,S4  Vascular Vessel Right Left  Radial Palpable Palpable  Brachial Palpable Palpable  Carotid Palpable, No Bruit Palpable, No Bruit  Aorta Not palpable N/A  Femoral Palpable Palpable  Popliteal Not palpable Not palpable  PT Not palpable Not palpable  DP Faintly palpable Not palpable    Gastro- intestinal soft, non-distended, non-tender to palpation, No guarding or rebound, no HSM, no masses, no CVAT B, No palpable prominent aortic pulse,    Musculo- skeletal M/S 5/5 throughout  , Extremities without ischemic changes  , No edema present, No visible varicosities , No Lipodermatosclerosis present, L forearm echmyosis with scab  Neurologic Cranial nerves 2-12 intact , Pain and light touch intact in extremities , Motor exam as listed above  Psychiatric Judgement intact, Mood & affect appropriate for pt's clinical situation  Dermatologic See M/S exam for extremity exam, No rashes otherwise noted  Lymphatic  Palpable lymph nodes: None     Non-invasive Vascular Imaging   B Carotid Duplex (11/23/17):  Marland Kitchen R ICA stenosis:  occluded . R VA: patent and antegrade . L ICA stenosis:  40-59% . L VA: patent and antegrade   Medical Decision Making   Francisco [name] is a 72 y.o. male who presents with: R ICA occlusion, asx L ICA stenosis <70%, alcoholism   Unclear how much patient's aphasia due to chronic alcoholism vs true CVA to L hemisphere as no recent MRI.  Pt's prior MRI in 2007 demonstrated no frank CVA.  Based on the patient's vascular studies and examination, I have offered the patient: annual B carotid duplex. . I discussed in depth with the  patient the nature of atherosclerosis, and emphasized the importance of maximal medical management including strict control of blood pressure, blood glucose, and lipid levels, obtaining regular exercise, antiplatelet  agents, and cessation of smoking.    The patient is currently on a statin: Lipitor.   The patient is currently not an antiplatelet due to GI bleeding from recurrent duodenal ulcers.  The patient is aware that without maximal medical management the underlying atherosclerotic disease process will progress, limiting the benefit of any interventions.  Thank you for allowing [place] to participate in this patient's care.   Adele Barthel, MD, FACS [facility] Office: [phone] Pager: [phone]  03/20/2018, 11:46 AM

## 2018-04-03 ENCOUNTER — Emergency Department (HOSPITAL_COMMUNITY): Payer: Medicare HMO

## 2018-04-03 ENCOUNTER — Emergency Department (HOSPITAL_COMMUNITY)
Admission: EM | Admit: 2018-04-03 | Discharge: 2018-04-03 | Disposition: A | Discharge status: Home / Self Care | Payer: Medicare HMO | Attending: Emergency Medicine | Admitting: Emergency Medicine

## 2018-04-03 ENCOUNTER — Encounter (HOSPITAL_COMMUNITY): Payer: Self-pay

## 2018-04-03 ENCOUNTER — Other Ambulatory Visit: Payer: Self-pay

## 2018-04-03 DIAGNOSIS — Y999 Unspecified external cause status: Secondary | ICD-10-CM | POA: Diagnosis not present

## 2018-04-03 DIAGNOSIS — Y92512 Supermarket, store or market as the place of occurrence of the external cause: Secondary | ICD-10-CM | POA: Insufficient documentation

## 2018-04-03 DIAGNOSIS — T148XXA Other injury of unspecified body region, initial encounter: Secondary | ICD-10-CM | POA: Diagnosis not present

## 2018-04-03 DIAGNOSIS — S0003XA Contusion of scalp, initial encounter: Secondary | ICD-10-CM | POA: Insufficient documentation

## 2018-04-03 DIAGNOSIS — Z87891 Personal history of nicotine dependence: Secondary | ICD-10-CM | POA: Diagnosis not present

## 2018-04-03 DIAGNOSIS — R51 Headache: Secondary | ICD-10-CM | POA: Diagnosis not present

## 2018-04-03 DIAGNOSIS — Y9301 Activity, walking, marching and hiking: Secondary | ICD-10-CM | POA: Diagnosis not present

## 2018-04-03 DIAGNOSIS — I5032 Chronic diastolic (congestive) heart failure: Secondary | ICD-10-CM | POA: Diagnosis not present

## 2018-04-03 DIAGNOSIS — Z79899 Other long term (current) drug therapy: Secondary | ICD-10-CM | POA: Insufficient documentation

## 2018-04-03 DIAGNOSIS — M542 Cervicalgia: Secondary | ICD-10-CM | POA: Diagnosis not present

## 2018-04-03 DIAGNOSIS — S0990XA Unspecified injury of head, initial encounter: Secondary | ICD-10-CM | POA: Diagnosis not present

## 2018-04-03 DIAGNOSIS — X58XXXA Exposure to other specified factors, initial encounter: Secondary | ICD-10-CM | POA: Diagnosis not present

## 2018-04-03 DIAGNOSIS — I110 Hypertensive heart disease with heart failure: Secondary | ICD-10-CM | POA: Insufficient documentation

## 2018-04-03 DIAGNOSIS — I252 Old myocardial infarction: Secondary | ICD-10-CM | POA: Insufficient documentation

## 2018-04-03 DIAGNOSIS — S199XXA Unspecified injury of neck, initial encounter: Secondary | ICD-10-CM | POA: Diagnosis not present

## 2018-04-03 DIAGNOSIS — J449 Chronic obstructive pulmonary disease, unspecified: Secondary | ICD-10-CM | POA: Diagnosis not present

## 2018-04-03 MED ORDER — TUBERCULIN PPD 5 UNIT/0.1ML ID SOLN
5.0000 [IU] | Freq: Once | INTRADERMAL | Status: DC
  Administered 2018-04-03: 5 [IU] via INTRADERMAL
  Filled 2018-04-03: qty 0.1

## 2018-04-03 MED ORDER — ACETAMINOPHEN 325 MG PO TABS
650.0000 mg | ORAL_TABLET | Freq: Once | ORAL | Status: DC
  Filled 2018-04-03: qty 2

## 2018-04-03 NOTE — Progress Notes (Signed)
CSW spoke with Janett Billow, [phone], from [facility] regarding patient potentially returning to facility. Patient was a previous resident at [facility] for 2 months before returning home. Patient returned to facility yesterday on his own behalf asking to return to facility. At this time, facility states they may potentially be able to accept patient today 5/29 if medically cleared from [facility] ED due to patient being a previous resident- patient will need chest x-ray if they are able to accept.   If facility is unable to accept patient today once medically stable patient will need to discharge home and follow up with facility from the community.   CSW will continue to follow  Kingsley Spittle, [facility] Emergency Room Clinical Social Worker [phone]

## 2018-04-03 NOTE — NC FL2 (Signed)
[place] LEVEL OF CARE SCREENING TOOL     IDENTIFICATION  Patient Name: Francisco [name] Birthdate: September 04, 1946 Sex: male Admission Date (Current Location): 04/03/2018  [place] and [place] Number:  Kathleen Argue [phone] [place] and Address:         Provider Number: [id number]  Attending Physician Name and Address:  Milton Ferguson, MD  Relative Name and Phone Number:       Current Level of Care: Hospital Recommended Level of Care: [place] Prior Approval Number:    Date Approved/Denied: 12/03/17 PASRR Number: [id number]  Discharge Plan: ([place])    Current Diagnoses: Patient Active Problem List   Diagnosis Date Noted  . Carotid disease, bilateral ([place]) 03/20/2018  . Hyponatremia 12/02/2017  . Falls 12/01/2017  . History of alcohol abuse   . Chest pain 08/14/2016  . Seizure ([place])   . Gout 12/17/2015  . Convulsions ([place]) 08/17/2015  . Gait instability 08/17/2015  . Alcoholism /alcohol abuse ([place]) 08/17/2015  . Cardiac device in situ, other   . Elevated troponin   . Cardiac arrest ([place])   . Complete heart block ([place])   . Seizures ([place])   . Chronic diastolic congestive heart failure ([place])   . HLD (hyperlipidemia)   . Alcohol withdrawal ([place])   . Acute renal failure syndrome ([place])   . Hypokalemia   . Hypomagnesemia   . Absolute anemia   . Hypertrophic cardiomyopathy ([place])   . Diastolic CHF, acute on chronic (HCC)   . Essential hypertension   . Alcohol withdrawal seizure ([place]) 06/29/2015  . 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 Mild diastolic dysfunction 40/34/7425  . Acute pain of right knee 06/29/2015  . Transaminitis 06/29/2015  . Acute renal failure ([place]) 06/29/2015  . High anion gap metabolic acidosis 95/63/8756  . Acute hypokalemia 06/29/2015  . Alcohol abuse, daily use 06/10/2015  . Hyperglycemia 06/09/2015  . Vitamin D deficiency 06/09/2015  . Hyperlipidemia 06/09/2015   . Bilateral lower extremity edema (chronic) 06/09/2015  . BPH (benign prostatic hypertrophy) 08/13/2014  . Multiple duodenal ulcers 07/03/2012  . Chest pain on exertion 07/02/2012  . GIB (gastrointestinal bleeding) 07/02/2012  . GERD (gastroesophageal reflux disease)   . Heart murmur   . Microcytic anemia 06/14/2012  . Generalized weakness 06/13/2012  . Celiac disease 06/13/2012  . HTN (hypertension) 06/13/2012  . Acute hyponatremia 06/13/2012    Orientation RESPIRATION BLADDER Height & Weight     Self, Time, Situation, Place  Normal Continent Weight: 186 lb (84.4 kg) Height:  5' 8"  (172.7 cm)  BEHAVIORAL SYMPTOMS/MOOD NEUROLOGICAL BOWEL NUTRITION STATUS      Continent  Regular Diet  AMBULATORY STATUS COMMUNICATION OF NEEDS Skin   Supervision Verbally Normal                       Personal Care Assistance Level of Assistance  Bathing, Dressing Bathing Assistance: Limited assistance   Dressing Assistance: Limited assistance     Functional Limitations Info             SPECIAL CARE FACTORS FREQUENCY  PT (By licensed PT), OT (By licensed OT)   Home Health Skilled Nursing to Evaluate and Treat     PT Frequency: 3 OT Frequency: 3            Contractures Contractures Info: Not present    Additional Factors Info  Code Status, Allergies Code Status Info: Prior  Current Medications (04/03/2018):  This is the current hospital active medication list Current Facility-Administered Medications  Medication Dose Route Frequency Provider Last Rate Last Dose  . acetaminophen (TYLENOL) tablet 650 mg  650 mg Oral Once Domenic Moras, PA-C       Current Outpatient Medications  Medication Sig Dispense Refill  . allopurinol (ZYLOPRIM) 300 MG tablet TAKE 1 TABLET(300 MG) BY MOUTH DAILY 90 tablet 0  . atorvastatin (LIPITOR) 20 MG tablet Take 1 tablet (20 mg total) by mouth daily. 90 tablet 3  . clotrimazole-betamethasone (LOTRISONE) cream APPLY EXTERNALLY TO  THE AFFECTED AREA TWICE DAILY 45 g 0  . colchicine 0.6 MG tablet Take 1 tablet (0.6 mg total) by mouth 2 (two) times daily as needed (gout flare). Take twice daily for 2 weeks, then use as needed for gout flare up (Patient not taking: Reported on 03/20/2018) 60 tablet 0  . ferrous sulfate 325 (65 FE) MG tablet Take 1 tablet (325 mg total) by mouth daily.    . folic acid (FOLVITE) 1 MG tablet Take 1 tablet (1 mg total) by mouth daily. (Patient not taking: Reported on 03/20/2018)    . furosemide (LASIX) 20 MG tablet TAKE 1 TABLET BY MOUTH DAILY 90 tablet 0  . levETIRAcetam (KEPPRA XR) 500 MG 24 hr tablet TAKE 3 TABLETS BY MOUTH DAILY 270 tablet 1  . lisinopril (PRINIVIL,ZESTRIL) 20 MG tablet Take 20 mg by mouth daily.  3  . metoprolol tartrate (LOPRESSOR) 50 MG tablet Take 1.5 tablets (75 mg total) by mouth 2 (two) times daily. 90 tablet 6  . Multiple Vitamin (MULTIVITAMIN WITH MINERALS) TABS tablet Take 1 tablet by mouth daily. (Patient not taking: Reported on 03/20/2018)    . naproxen sodium (ALEVE) 220 MG tablet Take 220 mg by mouth 2 (two) times daily as needed (pain/ headache).    . pantoprazole (PROTONIX) 40 MG tablet TAKE 1 TABLET BY MOUTH TWICE DAILY BEFORE A MEAL 180 tablet 0  . polyethylene glycol (MIRALAX / GLYCOLAX) packet Take 17 g by mouth 2 (two) times daily. 14 each 0  . potassium chloride (K-DUR) 10 MEQ tablet TAKE 1 TABLET(10 MEQ) BY MOUTH DAILY 90 tablet 0  . tamsulosin (FLOMAX) 0.4 MG CAPS capsule TAKE 1 CAPSULE(0.4 MG) BY MOUTH DAILY 90 capsule 0  . thiamine 100 MG tablet Take 1 tablet (100 mg total) by mouth daily.       Discharge Medications: Please see discharge summary for a list of discharge medications.  Relevant Imaging Results:  Relevant Lab Results:   Additional Information [id number]  Alphonse Guild Eshan Trupiano, LCSWA

## 2018-04-03 NOTE — ED Provider Notes (Addendum)
[facility] DEPT Provider Note   CSN: [id number] Arrival date & time: 04/03/18  0940     History   Chief Complaint Chief Complaint  Patient presents with  . Fall    HPI Francisco [name] is a 72 y.o. male.  HPI   72 year old male with history of anemia requiring blood transfusion in the past, hypertension, symptomatic bradycardia, heart murmur sent here from [facility] via EMS for evaluation of head injury.  Patient report yesterday he had a flat tire on his truck.  He drove to the time place to get it changed.  Upon walking into the establishment, he tripped over a jack stand and fell backwards striking his head against the ground.  No loss of consciousness but he did endorse pain to the back of his head.  Pain is described as a sharp throbbing nonradiating sensation, improved from prior and now mild.  No associated neck pain or any other injury.  He did hit his left elbow but denies any significant elbow pain.  No precipitating symptoms prior to the fall.  He relieves he tripped over the jack stand because he was outside in the sun, pumping the tires and the sun glare may have distracted him.  He mention being a resident of [facility] in the past.  He is currently living at home but would like to become a [facility] resident again.  During his evaluation to from the staff this AM, they noticed a hematoma to the back of his head, and request pt to be evaluated in the ER.    Past Medical History:  Diagnosis Date  . Anemia   . Celiac disease   . GERD (gastroesophageal reflux disease)   . Heart murmur   . Hypertension   . Multiple duodenal ulcers   . Shortness of breath   . Symptomatic Bradycardia    a. 07/2015 s/p MDT Advisa L DC PPM (Ser #: [id number]).  . 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 Carotid disease, bilateral ([place]) 03/20/2018  . Hyponatremia  12/02/2017  . Falls 12/01/2017  . History of alcohol abuse   . Chest pain 08/14/2016  . Seizure ([place])   . Gout 12/17/2015  . Convulsions ([place]) 08/17/2015  . Gait instability 08/17/2015  . Alcoholism /alcohol abuse ([place]) 08/17/2015  . Cardiac device in situ, other   . Elevated troponin   . Cardiac arrest ([place])   . Complete heart block ([place])   . Seizures ([place])   . Chronic diastolic congestive heart failure ([place])   . HLD (hyperlipidemia)   . Alcohol withdrawal ([place])   . Acute renal failure syndrome ([place])   . Hypokalemia   . Hypomagnesemia   . Absolute anemia   . Hypertrophic cardiomyopathy ([place])   . Diastolic CHF, acute on chronic (HCC)   . Essential hypertension   . Alcohol withdrawal seizure ([place]) 06/29/2015  . 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 Mild diastolic dysfunction 30/86/5784  . Acute pain of right knee 06/29/2015  . Transaminitis 06/29/2015  . Acute renal failure ([place]) 06/29/2015  . High anion gap metabolic acidosis 69/62/9528  . Acute hypokalemia 06/29/2015  . Alcohol abuse, daily use 06/10/2015  . Hyperglycemia 06/09/2015  . Vitamin D deficiency 06/09/2015  . Hyperlipidemia 06/09/2015  . Bilateral lower extremity edema (chronic) 06/09/2015  . BPH (benign prostatic hypertrophy) 08/13/2014  . Multiple duodenal ulcers 07/03/2012  .  Chest pain on exertion 07/02/2012  . GIB (gastrointestinal bleeding) 07/02/2012  . GERD (gastroesophageal reflux disease)   . Heart murmur   . Microcytic anemia 06/14/2012  . Generalized weakness 06/13/2012  . Celiac disease 06/13/2012  . HTN (hypertension) 06/13/2012  . Acute hyponatremia 06/13/2012    Past Surgical History:  Procedure Laterality Date  . CARDIAC CATHETERIZATION N/A 07/08/2015   Procedure: Left Heart Cath and Coronary Angiography;  Surgeon: Troy Sine, MD;  Location: [place];  Service: Cardiovascular;  Laterality: N/A;  . CARDIAC CATHETERIZATION N/A 07/08/2015    Procedure: Temporary Pacemaker;  Surgeon: Troy Sine, MD;  Location: [place] [facility];  Service: Cardiovascular;  Laterality: N/A;  . CARDIAC CATHETERIZATION N/A 07/09/2015   Procedure: Temporary Pacemaker;  Surgeon: Troy Sine, MD;  Location: [facility];  Service: Cardiovascular;  Laterality: N/A;  . EP IMPLANTABLE DEVICE N/A 07/09/2015   Procedure: Pacemaker Implant;  Surgeon: Will Meredith Leeds, MD;  Location: [facility];  Service: Cardiovascular;  Laterality: N/A;  . ESOPHAGOGASTRODUODENOSCOPY  07/03/2012   Procedure: ESOPHAGOGASTRODUODENOSCOPY (EGD);  Surgeon: Winfield Cunas., MD;  Location[facility] ENDOSCOPY;  Service: Endoscopy;  Laterality: N/A;  . HEMORRHOID SURGERY    . HERNIA REPAIR    . MOLE REMOVAL          Home Medications    Prior to Admission medications   Medication Sig Start Date End Date Taking? Authorizing Provider  allopurinol (ZYLOPRIM) 300 MG tablet TAKE 1 TABLET(300 MG) BY MOUTH DAILY 02/25/18   Dena Billet B, PA-C  atorvastatin (LIPITOR) 20 MG tablet Take 1 tablet (20 mg total) by mouth daily. 11/23/17   L[place]leton, Modena Nunnery, MD  clotrimazole-betamethasone (LOTRISONE) cream APPLY EXTERNALLY TO THE AFFECTED AREA TWICE DAILY 03/22/17   Alycia Rossetti, MD  colchicine 0.6 MG tablet Take 1 tablet (0.6 mg total) by mouth 2 (two) times daily as needed (gout flare). Take twice daily for 2 weeks, then use as needed for gout flare up Patient not taking: Reported on 03/20/2018 12/04/17   Eugenie Filler, MD  ferrous sulfate 325 (65 FE) MG tablet Take 1 tablet (325 mg total) by mouth daily. 12/04/17   Eugenie Filler, MD  folic acid (FOLVITE) 1 MG tablet Take 1 tablet (1 mg total) by mouth daily. Patient not taking: Reported on 03/20/2018 12/05/17   Eugenie Filler, MD  furosemide (LASIX) 20 MG tablet TAKE 1 TABLET BY MOUTH DAILY 01/23/18   Alycia Rossetti, MD  levETIRAcetam (KEPPRA XR) 500 MG 24 hr tablet TAKE 3 TABLETS BY MOUTH DAILY 06/29/17   Cameron Sprang, MD  lisinopril (PRINIVIL,ZESTRIL) 20 MG tablet Take 20 mg by mouth daily. 01/24/18   [provider]  metoprolol tartrate (LOPRESSOR) 50 MG tablet Take 1.5 tablets (75 mg total) by mouth 2 (two) times daily. 02/05/18 05/06/18  Camnitz, Ocie Doyne, MD  Multiple Vitamin (MULTIVITAMIN WITH MINERALS) TABS tablet Take 1 tablet by mouth daily. Patient not taking: Reported on 03/20/2018 12/05/17   Eugenie Filler, MD  naproxen sodium (ALEVE) 220 MG tablet Take 220 mg by mouth 2 (two) times daily as needed (pain/ headache).    [provider]  pantoprazole (PROTONIX) 40 MG tablet TAKE 1 TABLET BY MOUTH TWICE DAILY BEFORE A MEAL 08/29/17   Dixon, Mary B, PA-C  polyethylene glycol (MIRALAX / GLYCOLAX) packet Take 17 g by mouth 2 (two) times daily. 12/04/17   Eugenie Filler, MD  potassium chloride (K-DUR) 10 MEQ tablet TAKE 1 TABLET(10 MEQ) BY MOUTH DAILY 01/23/18   Dena Billet B, PA-C  tamsulosin (FLOMAX) 0.4 MG CAPS capsule TAKE 1 CAPSULE(0.4 MG) BY MOUTH DAILY 01/23/18   Orlena Sheldon, PA-C  thiamine 100 MG tablet Take 1 tablet (100 mg total) by mouth daily. 12/05/17   Eugenie Filler, MD    Family History Family History  Problem Relation Age of Onset  . Hodgkin's lymphoma Father     Social History Social History   Tobacco Use  . Smoking status: Former Smoker    Packs/day: 0.50    Years: 50.00    Pack years: 25.00    Types: Cigarettes  . Smokeless tobacco: Former Network engineer Use Topics  . Alcohol use: Yes    Alcohol/week: 0.0 oz    Types: 1 - 2 Glasses of wine per week    Comment: ocassional  . Drug use: No     Allergies   Gluten meal   Review of Systems Review of Systems  All other systems reviewed and are negative.    Physical Exam Updated Vital Signs BP 124/61 (BP Location: Right Arm)   Pulse 63   Temp 98.4 F (36.9 C) (Oral)   Resp 17   Ht 5' 8"  (1.727 m)   Wt 84.4 kg (186 lb)   SpO2 96%   BMI 28.28 kg/m   Physical Exam    Constitutional: He is oriented to person, place, and time. He appears well-developed and well-nourished. No distress.  Elderly male laying in bed in no acute discomfort, patient is jovial, and loquacious.  HENT:  Ecchymosis noted to the vertex of scalp mildly tender to palpation without any crepitus.  No hemotympanum, no septal hematoma, no malocclusion, no midface tenderness.  Eyes: Conjunctivae are normal.  Neck: Normal range of motion. Neck supple.  Mild tenderness to cervical midline at the level of C5-C7 without crepitus or step-off.  Cardiovascular: Normal rate and regular rhythm.  Pulmonary/Chest: Effort normal and breath sounds normal.  Abdominal: Soft. Bowel sounds are normal. He exhibits no distension.  Musculoskeletal: He exhibits tenderness (Left elbow: Mild tenderness to epicondyle with normal elbow flexion extension and no deformity.).  Neurological: He is alert and oriented to person, place, and time. He has normal strength. No cranial nerve deficit or sensory deficit. GCS eye subscore is 4. GCS verbal subscore is 5. GCS motor subscore is 6.  Skin: No rash noted.  Psychiatric: He has a normal mood and affect.  Nursing note and vitals reviewed.    ED Treatments / Results  Labs (all labs ordered are listed, but only abnormal results are displayed) Labs Reviewed - No data to display  EKG None  Radiology Ct Head Wo Contrast  Result Date: 04/03/2018 CLINICAL DATA:  72 year old male status post fall yesterday with blunt head trauma. Headache, dizziness, posterior neck pain. EXAM: CT HEAD WITHOUT CONTRAST CT CERVICAL SPINE WITHOUT CONTRAST TECHNIQUE: Multidetector CT imaging of the head and cervical spine was performed following the standard protocol without intravenous contrast. Multiplanar CT image reconstructions of the cervical spine were also generated. COMPARISON:  Head and cervical spine CT 11/30/2017, and earlier. FINDINGS: CT HEAD FINDINGS Brain: No midline shift,  ventriculomegaly, mass effect, evidence of mass lesion, intracranial hemorrhage or evidence of cortically based acute infarction. Stable cerebral volume. Chronic lacunar infarcts in the left deep gray matter nuclei. Stable patchy cerebral white matter hypodensity. Vascular: Calcified atherosclerosis at the skull base. Skull: No skull fracture  identified. Sinuses/Orbits: Visualized paranasal sinuses and mastoids are stable and well pneumatized. Other: Visualized orbit soft tissues are within normal limits. Left posterior convexity broad-based scalp hematoma and/or laceration (series 3, image 44). Underlying calvarium intact. Other scalp soft tissues appear within normal limits. CT CERVICAL SPINE FINDINGS Alignment: Stable. Chronic straightening of cervical lordosis with mild anterolisthesis at both C3-C4 and C6-C7. Bilateral posterior element alignment is within normal limits. Cervicothoracic junction alignment is within normal limits. Skull base and vertebrae: Visualized skull base is intact. No atlanto-occipital dissociation. No cervical spine fracture identified. Soft tissues and spinal canal: No prevertebral fluid or swelling. No visible canal hematoma. Severe chronic cervical carotid calcified atherosclerosis, worse on the right. Partially visible 2 centimeter chronic posterior left parotid space soft tissue mass. Disc levels: Moderate to severe left side cervical facet arthropathy at C2-C3 and C3-C4. Advanced cervical disc and endplate degeneration S5-K8 through C6-C7. Mild if any associated cervical spinal stenosis. Upper chest: Visible upper thoracic levels appear intact. Negative lung apices. Partially visible left chest pacemaker leads. IMPRESSION: 1. Scalp soft tissue injury without skull fracture. 2. Stable non contrast CT appearance of the brain. Chronic intracranial small vessel disease. 3.  No acute osseous abnormality in the cervical spine. 4. 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  Recommend ENT follow-up if not already obtained. 5. Chronic severe calcified cervical carotid atherosclerosis. Electronically Signed   By: Genevie Ann M.D.   On: 04/03/2018 13:58   Ct Cervical Spine Wo Contrast  Result Date: 04/03/2018 CLINICAL DATA:  72 year old male status post fall yesterday with blunt head trauma. Headache, dizziness, posterior neck pain. EXAM: CT HEAD WITHOUT CONTRAST CT CERVICAL SPINE WITHOUT CONTRAST TECHNIQUE: Multidetector CT imaging of the head and cervical spine was performed following the standard protocol without intravenous contrast. Multiplanar CT image reconstructions of the cervical spine were also generated. COMPARISON:  Head and cervical spine CT 11/30/2017, and earlier. FINDINGS: CT HEAD FINDINGS Brain: No midline shift, ventriculomegaly, mass effect, evidence of mass lesion, intracranial hemorrhage or evidence of cortically based acute infarction. Stable cerebral volume. Chronic lacunar infarcts in the left deep gray matter nuclei. Stable patchy cerebral white matter hypodensity. Vascular: Calcified atherosclerosis at the skull base. Skull: No skull fracture identified. Sinuses/Orbits: Visualized paranasal sinuses and mastoids are stable and well pneumatized. Other: Visualized orbit soft tissues are within normal limits. Left posterior convexity broad-based scalp hematoma and/or laceration (series 3, image 44). Underlying calvarium intact. Other scalp soft tissues appear within normal limits. CT CERVICAL SPINE FINDINGS Alignment: Stable. Chronic straightening of cervical lordosis with mild anterolisthesis at both C3-C4 and C6-C7. Bilateral posterior element alignment is within normal limits. Cervicothoracic junction alignment is within normal limits. Skull base and vertebrae: Visualized skull base is intact. No atlanto-occipital dissociation. No cervical spine fracture identified. Soft tissues and spinal canal: No prevertebral fluid or swelling. No visible canal hematoma. Severe  chronic cervical carotid calcified atherosclerosis, worse on the right. Partially visible 2 centimeter chronic posterior left parotid space soft tissue mass. Disc levels: Moderate to severe left side cervical facet arthropathy at C2-C3 and C3-C4. Advanced cervical disc and endplate degeneration L2-X5 through C6-C7. Mild if any associated cervical spinal stenosis. Upper chest: Visible upper thoracic levels appear intact. Negative lung apices. Partially visible left chest pacemaker leads. IMPRESSION: 1. Scalp soft tissue injury without skull fracture. 2. Stable non contrast CT appearance of the brain. Chronic intracranial small vessel disease. 3.  No acute osseous abnormality in the cervical spine. 4. Chronic 2 cm left  parotid space soft tissue mass. Recommend ENT follow-up if not already obtained. 5. Chronic severe calcified cervical carotid atherosclerosis. Electronically Signed   By: Genevie Ann M.D.   On: 04/03/2018 13:58    Procedures Procedures (including critical care time)  Medications Ordered in ED Medications  acetaminophen (TYLENOL) tablet 650 mg (650 mg Oral Refused 04/03/18 1420)     Initial Impression / Assessment and Plan / ED Course  I have reviewed the triage vital signs and the nursing notes.  Pertinent labs & imaging results that were available during my care of the patient were reviewed by me and considered in my medical decision making (see chart for details).     BP (!) 159/67 (BP Location: Left Arm)   Pulse 61   Temp 98.4 F (36.9 C) (Oral)   Resp 17   Ht 5' 8"  (1.727 m)   Wt 84.4 kg (186 lb)   SpO2 98%   BMI 28.28 kg/m    Final Clinical Impressions(s) / ED Diagnoses   Final diagnoses:  Contusion of scalp, initial encounter    ED Discharge Orders    None     2:51 PM Patient had a mechanical fall yesterday and injured at the back of his head.  Sent here per nursing facility for further evaluation.  CT scan did demonstrate scalp soft tissue injury without skull  fracture or other concerning feature.  There is a chronic 2 cm left parotid space soft tissue mass.  Patient may need to follow-up with ENT if he has been evaluated for that in the past.  This was reflected on his discharge paper so that way he can follow-up appropriately.  Care discussed with Dr. Roderic Palau.  Patient otherwise stable for discharge.   3:30 PM [facility] facility request a screening CXR for TB prior to return back to facility.  If cleared, they request a note to document such.  Will order CXR.    Domenic Moras, PA-C 04/03/18 1654    Milton Ferguson, MD 04/04/18 [phone]

## 2018-04-03 NOTE — Progress Notes (Addendum)
CSW spoke with Mia at [facility] and with the pt's verbal permission the CSW created an FL-2 and faxed with the CSW Asst Director's permission the pt's FL-2, facesheet, chest x-ray and requested a TB skin prick which had been ordered by the EPD.  Mia has received FL-2 and chest x-rays and [facility] transportation is here at the [facility] ED and CSW awaiting the skin prick to be administered for TB testing purposes.  Mia at [facility]. ALF stated [place] can read the TB test on Friday 5/31 after pt is transported to [facility] from the [facility] ED.  6:36 PM CSW faxed pt's TB order signed by the EPD stating that the test was completed on the pt before D/C as requested by [place] at [facility].  Mia stated she understood and was appreciative and thanked the CSW.  Pt was D/C'd.  CSW will continue to follow for D/C needs.  Alphonse Guild. Robie Oats, LCSW, LCAS, CSI Clinical Social Worker Ph: [phone]

## 2018-04-03 NOTE — ED Notes (Signed)
Patient ambulating to CT with CT tech at this time.

## 2018-04-03 NOTE — ED Triage Notes (Signed)
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 Patient states his head hit the floor jack.

## 2018-04-03 NOTE — ED Triage Notes (Signed)
Per EMS- Patient was picked up at [facility]. Patient is not an official resident. Patient used to be a resident of [facility]. Patient uses a walker and fell at home. Patient has a hematoma to the head. Patient denies any neck, back, or head pain. Patient is not on blood thinners. Patient went to [facility] after his fall and spent the night-patient requesting to be a resident again. Social worker saw the hematoma and called EMS. EMS reports that the social worker at [facility] wants to talk to the [place] worker prior to patient being discharged.

## 2018-04-03 NOTE — Discharge Instructions (Addendum)
He has been evaluated for a fall.  You do have a scalp soft tissue injury without any evidence of broken bones or brain injury.  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  Please follow-up with an ENT specialist as needed.  Use resources provided.  Take Tylenol as needed for pain.  Return if you have any concern. Your chest xray today did not show any concerning changes. No evidence of TB.

## 2018-04-03 NOTE — ED Notes (Signed)
[facility] Case Mgr - Janett Billow [phone] Please call with update, pt will need to be re-evaluated before returning to facility.

## 2018-04-03 NOTE — NC FL2 (Addendum)
[place] LEVEL OF CARE SCREENING TOOL     IDENTIFICATION  Patient Name: Francisco [name] Birthdate: [birth year] Sex: male Admission Date (Current Location): 04/03/2018  [place] and [place] Number:  Francisco [name] [id number] [place] and Address:         Provider Number: [id number]  Attending Physician Name and Address:  Francisco Ferguson, MD  Relative Name and Phone Number:       Current Level of Care: Hospital Recommended Level of Care: [place] Prior Approval Number:    Date Approved/Denied: 12/03/17 PASRR Number: [id number]  Discharge Plan: ([place])    Current Diagnoses: Patient Active Problem List   Diagnosis Date Noted  . Carotid disease, bilateral ([place]) 03/20/2018  . Hyponatremia 12/02/2017  . Falls 12/01/2017  . History of alcohol abuse   . Chest pain 08/14/2016  . Seizure ([place])   . Gout 12/17/2015  . Convulsions ([place]) 08/17/2015  . Gait instability 08/17/2015  . Alcoholism /alcohol abuse ([place]) 08/17/2015  . Cardiac device in situ, other   . Elevated troponin   . Cardiac arrest ([place])   . Complete heart block ([place])   . Seizures ([place])   . Chronic diastolic congestive heart failure ([place])   . HLD (hyperlipidemia)   . Alcohol withdrawal ([place])   . Acute renal failure syndrome ([place])   . Hypokalemia   . Hypomagnesemia   . Absolute anemia   . Hypertrophic cardiomyopathy ([place])   . Diastolic CHF, acute on chronic (HCC)   . Essential hypertension   . Alcohol withdrawal seizure ([place]) 06/29/2015  . 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 Mild diastolic dysfunction 29/79/8921  . Acute pain of right knee 06/29/2015  . Transaminitis 06/29/2015  . Acute renal failure ([place]) 06/29/2015  . High anion gap metabolic acidosis 19/41/7408  . Acute hypokalemia 06/29/2015  . Alcohol abuse, daily use 06/10/2015  . Hyperglycemia 06/09/2015  . Vitamin D deficiency 06/09/2015  . Hyperlipidemia 06/09/2015   . Bilateral lower extremity edema (chronic) 06/09/2015  . BPH (benign prostatic hypertrophy) 08/13/2014  . Multiple duodenal ulcers 07/03/2012  . Chest pain on exertion 07/02/2012  . GIB (gastrointestinal bleeding) 07/02/2012  . GERD (gastroesophageal reflux disease)   . Heart murmur   . Microcytic anemia 06/14/2012  . Generalized weakness 06/13/2012  . Celiac disease 06/13/2012  . HTN (hypertension) 06/13/2012  . Acute hyponatremia 06/13/2012    Orientation RESPIRATION BLADDER Height & Weight     Self, Time, Situation, Place  Normal Continent Weight: 186 lb (84.4 kg) Height:  5' 8"  (172.7 cm)  BEHAVIORAL SYMPTOMS/MOOD NEUROLOGICAL BOWEL NUTRITION STATUS      Continent    AMBULATORY STATUS COMMUNICATION OF NEEDS Skin   Supervision Verbally Normal                       Personal Care Assistance Level of Assistance  Bathing, Dressing Bathing Assistance: Limited assistance   Dressing Assistance: Limited assistance     Functional Limitations Info             SPECIAL CARE FACTORS FREQUENCY  PT (By licensed PT), OT (By licensed OT)     PT Frequency: 5 OT Frequency: 5            Contractures Contractures Info: Not present    Additional Factors Info  Code Status, Allergies Code Status Info: Prior             Current Medications (04/03/2018):  This is the current hospital  active medication list Current Facility-Administered Medications  Medication Dose Route Frequency Provider Last Rate Last Dose  . acetaminophen (TYLENOL) tablet 650 mg  650 mg Oral Once Domenic Moras, PA-C       Current Outpatient Medications  Medication Sig Dispense Refill  . allopurinol (ZYLOPRIM) 300 MG tablet TAKE 1 TABLET(300 MG) BY MOUTH DAILY 90 tablet 0  . atorvastatin (LIPITOR) 20 MG tablet Take 1 tablet (20 mg total) by mouth daily. 90 tablet 3  . clotrimazole-betamethasone (LOTRISONE) cream APPLY EXTERNALLY TO THE AFFECTED AREA TWICE DAILY 45 g 0  . colchicine 0.6 MG tablet  Take 1 tablet (0.6 mg total) by mouth 2 (two) times daily as needed (gout flare). Take twice daily for 2 weeks, then use as needed for gout flare up (Patient not taking: Reported on 03/20/2018) 60 tablet 0  . ferrous sulfate 325 (65 FE) MG tablet Take 1 tablet (325 mg total) by mouth daily.    . folic acid (FOLVITE) 1 MG tablet Take 1 tablet (1 mg total) by mouth daily. (Patient not taking: Reported on 03/20/2018)    . furosemide (LASIX) 20 MG tablet TAKE 1 TABLET BY MOUTH DAILY 90 tablet 0  . levETIRAcetam (KEPPRA XR) 500 MG 24 hr tablet TAKE 3 TABLETS BY MOUTH DAILY 270 tablet 1  . lisinopril (PRINIVIL,ZESTRIL) 20 MG tablet Take 20 mg by mouth daily.  3  . metoprolol tartrate (LOPRESSOR) 50 MG tablet Take 1.5 tablets (75 mg total) by mouth 2 (two) times daily. 90 tablet 6  . Multiple Vitamin (MULTIVITAMIN WITH MINERALS) TABS tablet Take 1 tablet by mouth daily. (Patient not taking: Reported on 03/20/2018)    . naproxen sodium (ALEVE) 220 MG tablet Take 220 mg by mouth 2 (two) times daily as needed (pain/ headache).    . pantoprazole (PROTONIX) 40 MG tablet TAKE 1 TABLET BY MOUTH TWICE DAILY BEFORE A MEAL 180 tablet 0  . polyethylene glycol (MIRALAX / GLYCOLAX) packet Take 17 g by mouth 2 (two) times daily. 14 each 0  . potassium chloride (K-DUR) 10 MEQ tablet TAKE 1 TABLET(10 MEQ) BY MOUTH DAILY 90 tablet 0  . tamsulosin (FLOMAX) 0.4 MG CAPS capsule TAKE 1 CAPSULE(0.4 MG) BY MOUTH DAILY 90 capsule 0  . thiamine 100 MG tablet Take 1 tablet (100 mg total) by mouth daily.       Discharge Medications: Please see discharge summary for a list of discharge medications.  Relevant Imaging Results:  Relevant Lab Results:   Additional Information [id number]  Francisco [name] Francisco [name], LCSWA

## 2018-04-04 DIAGNOSIS — E871 Hypo-osmolality and hyponatremia: Secondary | ICD-10-CM | POA: Diagnosis not present

## 2018-04-05 DIAGNOSIS — E871 Hypo-osmolality and hyponatremia: Secondary | ICD-10-CM | POA: Diagnosis not present

## 2018-04-06 DIAGNOSIS — E871 Hypo-osmolality and hyponatremia: Secondary | ICD-10-CM | POA: Diagnosis not present

## 2018-04-07 DIAGNOSIS — E871 Hypo-osmolality and hyponatremia: Secondary | ICD-10-CM | POA: Diagnosis not present

## 2018-04-08 DIAGNOSIS — E871 Hypo-osmolality and hyponatremia: Secondary | ICD-10-CM | POA: Diagnosis not present

## 2018-04-09 DIAGNOSIS — E871 Hypo-osmolality and hyponatremia: Secondary | ICD-10-CM | POA: Diagnosis not present

## 2018-04-10 DIAGNOSIS — E871 Hypo-osmolality and hyponatremia: Secondary | ICD-10-CM | POA: Diagnosis not present

## 2018-04-11 DIAGNOSIS — E871 Hypo-osmolality and hyponatremia: Secondary | ICD-10-CM | POA: Diagnosis not present

## 2018-04-12 DIAGNOSIS — E871 Hypo-osmolality and hyponatremia: Secondary | ICD-10-CM | POA: Diagnosis not present

## 2018-04-13 DIAGNOSIS — E871 Hypo-osmolality and hyponatremia: Secondary | ICD-10-CM | POA: Diagnosis not present

## 2018-04-14 DIAGNOSIS — E871 Hypo-osmolality and hyponatremia: Secondary | ICD-10-CM | POA: Diagnosis not present

## 2018-04-15 DIAGNOSIS — E871 Hypo-osmolality and hyponatremia: Secondary | ICD-10-CM | POA: Diagnosis not present

## 2018-04-16 DIAGNOSIS — E871 Hypo-osmolality and hyponatremia: Secondary | ICD-10-CM | POA: Diagnosis not present

## 2018-04-17 DIAGNOSIS — E871 Hypo-osmolality and hyponatremia: Secondary | ICD-10-CM | POA: Diagnosis not present

## 2018-04-18 DIAGNOSIS — E871 Hypo-osmolality and hyponatremia: Secondary | ICD-10-CM | POA: Diagnosis not present

## 2018-04-19 DIAGNOSIS — E871 Hypo-osmolality and hyponatremia: Secondary | ICD-10-CM | POA: Diagnosis not present

## 2018-04-20 DIAGNOSIS — E871 Hypo-osmolality and hyponatremia: Secondary | ICD-10-CM | POA: Diagnosis not present

## 2018-04-21 DIAGNOSIS — E871 Hypo-osmolality and hyponatremia: Secondary | ICD-10-CM | POA: Diagnosis not present

## 2018-04-22 DIAGNOSIS — E871 Hypo-osmolality and hyponatremia: Secondary | ICD-10-CM | POA: Diagnosis not present

## 2018-04-23 DIAGNOSIS — E871 Hypo-osmolality and hyponatremia: Secondary | ICD-10-CM | POA: Diagnosis not present

## 2018-04-24 DIAGNOSIS — E871 Hypo-osmolality and hyponatremia: Secondary | ICD-10-CM | POA: Diagnosis not present

## 2018-04-25 DIAGNOSIS — E871 Hypo-osmolality and hyponatremia: Secondary | ICD-10-CM | POA: Diagnosis not present

## 2018-04-26 DIAGNOSIS — E871 Hypo-osmolality and hyponatremia: Secondary | ICD-10-CM | POA: Diagnosis not present

## 2018-04-27 DIAGNOSIS — E871 Hypo-osmolality and hyponatremia: Secondary | ICD-10-CM | POA: Diagnosis not present

## 2018-04-28 DIAGNOSIS — E871 Hypo-osmolality and hyponatremia: Secondary | ICD-10-CM | POA: Diagnosis not present

## 2018-04-29 DIAGNOSIS — E871 Hypo-osmolality and hyponatremia: Secondary | ICD-10-CM | POA: Diagnosis not present

## 2018-04-30 DIAGNOSIS — E871 Hypo-osmolality and hyponatremia: Secondary | ICD-10-CM | POA: Diagnosis not present

## 2018-05-01 DIAGNOSIS — E871 Hypo-osmolality and hyponatremia: Secondary | ICD-10-CM | POA: Diagnosis not present

## 2018-05-02 DIAGNOSIS — E871 Hypo-osmolality and hyponatremia: Secondary | ICD-10-CM | POA: Diagnosis not present

## 2018-05-03 DIAGNOSIS — E871 Hypo-osmolality and hyponatremia: Secondary | ICD-10-CM | POA: Diagnosis not present

## 2018-05-04 DIAGNOSIS — E871 Hypo-osmolality and hyponatremia: Secondary | ICD-10-CM | POA: Diagnosis not present

## 2018-05-04 DIAGNOSIS — R2689 Other abnormalities of gait and mobility: Secondary | ICD-10-CM | POA: Diagnosis not present

## 2018-05-04 DIAGNOSIS — R2681 Unsteadiness on feet: Secondary | ICD-10-CM | POA: Diagnosis not present

## 2018-05-04 DIAGNOSIS — R296 Repeated falls: Secondary | ICD-10-CM | POA: Diagnosis not present

## 2018-05-05 DIAGNOSIS — E871 Hypo-osmolality and hyponatremia: Secondary | ICD-10-CM | POA: Diagnosis not present

## 2018-05-06 DIAGNOSIS — R2689 Other abnormalities of gait and mobility: Secondary | ICD-10-CM | POA: Diagnosis not present

## 2018-05-06 DIAGNOSIS — R296 Repeated falls: Secondary | ICD-10-CM | POA: Diagnosis not present

## 2018-05-06 DIAGNOSIS — E871 Hypo-osmolality and hyponatremia: Secondary | ICD-10-CM | POA: Diagnosis not present

## 2018-05-06 DIAGNOSIS — R278 Other lack of coordination: Secondary | ICD-10-CM | POA: Diagnosis not present

## 2018-05-06 DIAGNOSIS — M6281 Muscle weakness (generalized): Secondary | ICD-10-CM | POA: Diagnosis not present

## 2018-05-06 DIAGNOSIS — R2681 Unsteadiness on feet: Secondary | ICD-10-CM | POA: Diagnosis not present

## 2018-05-07 DIAGNOSIS — R296 Repeated falls: Secondary | ICD-10-CM | POA: Diagnosis not present

## 2018-05-07 DIAGNOSIS — R2689 Other abnormalities of gait and mobility: Secondary | ICD-10-CM | POA: Diagnosis not present

## 2018-05-07 DIAGNOSIS — R2681 Unsteadiness on feet: Secondary | ICD-10-CM | POA: Diagnosis not present

## 2018-05-07 DIAGNOSIS — E871 Hypo-osmolality and hyponatremia: Secondary | ICD-10-CM | POA: Diagnosis not present

## 2018-05-07 DIAGNOSIS — M6281 Muscle weakness (generalized): Secondary | ICD-10-CM | POA: Diagnosis not present

## 2018-05-07 DIAGNOSIS — R278 Other lack of coordination: Secondary | ICD-10-CM | POA: Diagnosis not present

## 2018-05-08 DIAGNOSIS — M6281 Muscle weakness (generalized): Secondary | ICD-10-CM | POA: Diagnosis not present

## 2018-05-08 DIAGNOSIS — R2681 Unsteadiness on feet: Secondary | ICD-10-CM | POA: Diagnosis not present

## 2018-05-08 DIAGNOSIS — R296 Repeated falls: Secondary | ICD-10-CM | POA: Diagnosis not present

## 2018-05-08 DIAGNOSIS — E871 Hypo-osmolality and hyponatremia: Secondary | ICD-10-CM | POA: Diagnosis not present

## 2018-05-08 DIAGNOSIS — R278 Other lack of coordination: Secondary | ICD-10-CM | POA: Diagnosis not present

## 2018-05-08 DIAGNOSIS — R2689 Other abnormalities of gait and mobility: Secondary | ICD-10-CM | POA: Diagnosis not present

## 2018-05-09 DIAGNOSIS — M6281 Muscle weakness (generalized): Secondary | ICD-10-CM | POA: Diagnosis not present

## 2018-05-09 DIAGNOSIS — R2689 Other abnormalities of gait and mobility: Secondary | ICD-10-CM | POA: Diagnosis not present

## 2018-05-09 DIAGNOSIS — R2681 Unsteadiness on feet: Secondary | ICD-10-CM | POA: Diagnosis not present

## 2018-05-09 DIAGNOSIS — R296 Repeated falls: Secondary | ICD-10-CM | POA: Diagnosis not present

## 2018-05-09 DIAGNOSIS — R278 Other lack of coordination: Secondary | ICD-10-CM | POA: Diagnosis not present

## 2018-05-09 DIAGNOSIS — E871 Hypo-osmolality and hyponatremia: Secondary | ICD-10-CM | POA: Diagnosis not present

## 2018-05-10 DIAGNOSIS — E871 Hypo-osmolality and hyponatremia: Secondary | ICD-10-CM | POA: Diagnosis not present

## 2018-05-11 DIAGNOSIS — E871 Hypo-osmolality and hyponatremia: Secondary | ICD-10-CM | POA: Diagnosis not present

## 2018-05-12 DIAGNOSIS — E871 Hypo-osmolality and hyponatremia: Secondary | ICD-10-CM | POA: Diagnosis not present

## 2018-05-13 DIAGNOSIS — R278 Other lack of coordination: Secondary | ICD-10-CM | POA: Diagnosis not present

## 2018-05-13 DIAGNOSIS — E871 Hypo-osmolality and hyponatremia: Secondary | ICD-10-CM | POA: Diagnosis not present

## 2018-05-13 DIAGNOSIS — R2681 Unsteadiness on feet: Secondary | ICD-10-CM | POA: Diagnosis not present

## 2018-05-13 DIAGNOSIS — R296 Repeated falls: Secondary | ICD-10-CM | POA: Diagnosis not present

## 2018-05-13 DIAGNOSIS — R2689 Other abnormalities of gait and mobility: Secondary | ICD-10-CM | POA: Diagnosis not present

## 2018-05-13 DIAGNOSIS — M6281 Muscle weakness (generalized): Secondary | ICD-10-CM | POA: Diagnosis not present

## 2018-05-14 DIAGNOSIS — R2681 Unsteadiness on feet: Secondary | ICD-10-CM | POA: Diagnosis not present

## 2018-05-14 DIAGNOSIS — R296 Repeated falls: Secondary | ICD-10-CM | POA: Diagnosis not present

## 2018-05-14 DIAGNOSIS — E871 Hypo-osmolality and hyponatremia: Secondary | ICD-10-CM | POA: Diagnosis not present

## 2018-05-14 DIAGNOSIS — R278 Other lack of coordination: Secondary | ICD-10-CM | POA: Diagnosis not present

## 2018-05-14 DIAGNOSIS — R2689 Other abnormalities of gait and mobility: Secondary | ICD-10-CM | POA: Diagnosis not present

## 2018-05-14 DIAGNOSIS — M6281 Muscle weakness (generalized): Secondary | ICD-10-CM | POA: Diagnosis not present

## 2018-05-15 DIAGNOSIS — E871 Hypo-osmolality and hyponatremia: Secondary | ICD-10-CM | POA: Diagnosis not present

## 2018-05-16 DIAGNOSIS — R278 Other lack of coordination: Secondary | ICD-10-CM | POA: Diagnosis not present

## 2018-05-16 DIAGNOSIS — M6281 Muscle weakness (generalized): Secondary | ICD-10-CM | POA: Diagnosis not present

## 2018-05-16 DIAGNOSIS — R2681 Unsteadiness on feet: Secondary | ICD-10-CM | POA: Diagnosis not present

## 2018-05-16 DIAGNOSIS — R2689 Other abnormalities of gait and mobility: Secondary | ICD-10-CM | POA: Diagnosis not present

## 2018-05-16 DIAGNOSIS — E871 Hypo-osmolality and hyponatremia: Secondary | ICD-10-CM | POA: Diagnosis not present

## 2018-05-16 DIAGNOSIS — R296 Repeated falls: Secondary | ICD-10-CM | POA: Diagnosis not present

## 2018-05-17 DIAGNOSIS — E871 Hypo-osmolality and hyponatremia: Secondary | ICD-10-CM | POA: Diagnosis not present

## 2018-05-17 DIAGNOSIS — R296 Repeated falls: Secondary | ICD-10-CM | POA: Diagnosis not present

## 2018-05-17 DIAGNOSIS — M6281 Muscle weakness (generalized): Secondary | ICD-10-CM | POA: Diagnosis not present

## 2018-05-17 DIAGNOSIS — R2689 Other abnormalities of gait and mobility: Secondary | ICD-10-CM | POA: Diagnosis not present

## 2018-05-17 DIAGNOSIS — R278 Other lack of coordination: Secondary | ICD-10-CM | POA: Diagnosis not present

## 2018-05-17 DIAGNOSIS — R2681 Unsteadiness on feet: Secondary | ICD-10-CM | POA: Diagnosis not present

## 2018-05-18 DIAGNOSIS — E871 Hypo-osmolality and hyponatremia: Secondary | ICD-10-CM | POA: Diagnosis not present

## 2018-05-19 DIAGNOSIS — R2681 Unsteadiness on feet: Secondary | ICD-10-CM | POA: Diagnosis not present

## 2018-05-19 DIAGNOSIS — R296 Repeated falls: Secondary | ICD-10-CM | POA: Diagnosis not present

## 2018-05-19 DIAGNOSIS — M6281 Muscle weakness (generalized): Secondary | ICD-10-CM | POA: Diagnosis not present

## 2018-05-19 DIAGNOSIS — R2689 Other abnormalities of gait and mobility: Secondary | ICD-10-CM | POA: Diagnosis not present

## 2018-05-19 DIAGNOSIS — R278 Other lack of coordination: Secondary | ICD-10-CM | POA: Diagnosis not present

## 2018-05-19 DIAGNOSIS — E871 Hypo-osmolality and hyponatremia: Secondary | ICD-10-CM | POA: Diagnosis not present

## 2018-05-20 DIAGNOSIS — R2681 Unsteadiness on feet: Secondary | ICD-10-CM | POA: Diagnosis not present

## 2018-05-20 DIAGNOSIS — M6281 Muscle weakness (generalized): Secondary | ICD-10-CM | POA: Diagnosis not present

## 2018-05-20 DIAGNOSIS — R278 Other lack of coordination: Secondary | ICD-10-CM | POA: Diagnosis not present

## 2018-05-20 DIAGNOSIS — R2689 Other abnormalities of gait and mobility: Secondary | ICD-10-CM | POA: Diagnosis not present

## 2018-05-20 DIAGNOSIS — E871 Hypo-osmolality and hyponatremia: Secondary | ICD-10-CM | POA: Diagnosis not present

## 2018-05-20 DIAGNOSIS — R296 Repeated falls: Secondary | ICD-10-CM | POA: Diagnosis not present

## 2018-05-21 DIAGNOSIS — R2689 Other abnormalities of gait and mobility: Secondary | ICD-10-CM | POA: Diagnosis not present

## 2018-05-21 DIAGNOSIS — R296 Repeated falls: Secondary | ICD-10-CM | POA: Diagnosis not present

## 2018-05-21 DIAGNOSIS — E871 Hypo-osmolality and hyponatremia: Secondary | ICD-10-CM | POA: Diagnosis not present

## 2018-05-21 DIAGNOSIS — R278 Other lack of coordination: Secondary | ICD-10-CM | POA: Diagnosis not present

## 2018-05-21 DIAGNOSIS — R2681 Unsteadiness on feet: Secondary | ICD-10-CM | POA: Diagnosis not present

## 2018-05-21 DIAGNOSIS — M6281 Muscle weakness (generalized): Secondary | ICD-10-CM | POA: Diagnosis not present

## 2018-05-22 DIAGNOSIS — E871 Hypo-osmolality and hyponatremia: Secondary | ICD-10-CM | POA: Diagnosis not present

## 2018-05-23 DIAGNOSIS — E871 Hypo-osmolality and hyponatremia: Secondary | ICD-10-CM | POA: Diagnosis not present

## 2018-05-23 DIAGNOSIS — R278 Other lack of coordination: Secondary | ICD-10-CM | POA: Diagnosis not present

## 2018-05-23 DIAGNOSIS — M6281 Muscle weakness (generalized): Secondary | ICD-10-CM | POA: Diagnosis not present

## 2018-05-23 DIAGNOSIS — R2689 Other abnormalities of gait and mobility: Secondary | ICD-10-CM | POA: Diagnosis not present

## 2018-05-23 DIAGNOSIS — R296 Repeated falls: Secondary | ICD-10-CM | POA: Diagnosis not present

## 2018-05-23 DIAGNOSIS — R2681 Unsteadiness on feet: Secondary | ICD-10-CM | POA: Diagnosis not present

## 2018-05-24 DIAGNOSIS — E871 Hypo-osmolality and hyponatremia: Secondary | ICD-10-CM | POA: Diagnosis not present

## 2018-05-25 DIAGNOSIS — E871 Hypo-osmolality and hyponatremia: Secondary | ICD-10-CM | POA: Diagnosis not present

## 2018-05-26 DIAGNOSIS — E871 Hypo-osmolality and hyponatremia: Secondary | ICD-10-CM | POA: Diagnosis not present

## 2018-05-27 DIAGNOSIS — M6281 Muscle weakness (generalized): Secondary | ICD-10-CM | POA: Diagnosis not present

## 2018-05-27 DIAGNOSIS — E871 Hypo-osmolality and hyponatremia: Secondary | ICD-10-CM | POA: Diagnosis not present

## 2018-05-27 DIAGNOSIS — R2681 Unsteadiness on feet: Secondary | ICD-10-CM | POA: Diagnosis not present

## 2018-05-27 DIAGNOSIS — R2689 Other abnormalities of gait and mobility: Secondary | ICD-10-CM | POA: Diagnosis not present

## 2018-05-27 DIAGNOSIS — R296 Repeated falls: Secondary | ICD-10-CM | POA: Diagnosis not present

## 2018-05-27 DIAGNOSIS — R278 Other lack of coordination: Secondary | ICD-10-CM | POA: Diagnosis not present

## 2018-05-28 DIAGNOSIS — E871 Hypo-osmolality and hyponatremia: Secondary | ICD-10-CM | POA: Diagnosis not present

## 2018-05-28 DIAGNOSIS — M6281 Muscle weakness (generalized): Secondary | ICD-10-CM | POA: Diagnosis not present

## 2018-05-28 DIAGNOSIS — R2689 Other abnormalities of gait and mobility: Secondary | ICD-10-CM | POA: Diagnosis not present

## 2018-05-28 DIAGNOSIS — R2681 Unsteadiness on feet: Secondary | ICD-10-CM | POA: Diagnosis not present

## 2018-05-28 DIAGNOSIS — R278 Other lack of coordination: Secondary | ICD-10-CM | POA: Diagnosis not present

## 2018-05-28 DIAGNOSIS — R296 Repeated falls: Secondary | ICD-10-CM | POA: Diagnosis not present

## 2018-05-29 ENCOUNTER — Other Ambulatory Visit: Payer: Self-pay | Admitting: Physician Assistant

## 2018-05-29 DIAGNOSIS — E871 Hypo-osmolality and hyponatremia: Secondary | ICD-10-CM | POA: Diagnosis not present

## 2018-05-29 DIAGNOSIS — R2689 Other abnormalities of gait and mobility: Secondary | ICD-10-CM | POA: Diagnosis not present

## 2018-05-29 DIAGNOSIS — R2681 Unsteadiness on feet: Secondary | ICD-10-CM | POA: Diagnosis not present

## 2018-05-29 DIAGNOSIS — M6281 Muscle weakness (generalized): Secondary | ICD-10-CM | POA: Diagnosis not present

## 2018-05-29 DIAGNOSIS — R278 Other lack of coordination: Secondary | ICD-10-CM | POA: Diagnosis not present

## 2018-05-29 DIAGNOSIS — R296 Repeated falls: Secondary | ICD-10-CM | POA: Diagnosis not present

## 2018-05-30 DIAGNOSIS — E871 Hypo-osmolality and hyponatremia: Secondary | ICD-10-CM | POA: Diagnosis not present

## 2018-05-31 DIAGNOSIS — R2681 Unsteadiness on feet: Secondary | ICD-10-CM | POA: Diagnosis not present

## 2018-05-31 DIAGNOSIS — R2689 Other abnormalities of gait and mobility: Secondary | ICD-10-CM | POA: Diagnosis not present

## 2018-05-31 DIAGNOSIS — R278 Other lack of coordination: Secondary | ICD-10-CM | POA: Diagnosis not present

## 2018-05-31 DIAGNOSIS — M6281 Muscle weakness (generalized): Secondary | ICD-10-CM | POA: Diagnosis not present

## 2018-05-31 DIAGNOSIS — R296 Repeated falls: Secondary | ICD-10-CM | POA: Diagnosis not present

## 2018-05-31 DIAGNOSIS — E871 Hypo-osmolality and hyponatremia: Secondary | ICD-10-CM | POA: Diagnosis not present

## 2018-06-01 DIAGNOSIS — E871 Hypo-osmolality and hyponatremia: Secondary | ICD-10-CM | POA: Diagnosis not present

## 2018-06-02 ENCOUNTER — Other Ambulatory Visit: Payer: Self-pay | Admitting: Physician Assistant

## 2018-06-02 DIAGNOSIS — E871 Hypo-osmolality and hyponatremia: Secondary | ICD-10-CM | POA: Diagnosis not present

## 2018-06-02 DIAGNOSIS — I10 Essential (primary) hypertension: Secondary | ICD-10-CM

## 2018-06-03 DIAGNOSIS — R278 Other lack of coordination: Secondary | ICD-10-CM | POA: Diagnosis not present

## 2018-06-03 DIAGNOSIS — M6281 Muscle weakness (generalized): Secondary | ICD-10-CM | POA: Diagnosis not present

## 2018-06-03 DIAGNOSIS — R2681 Unsteadiness on feet: Secondary | ICD-10-CM | POA: Diagnosis not present

## 2018-06-03 DIAGNOSIS — E871 Hypo-osmolality and hyponatremia: Secondary | ICD-10-CM | POA: Diagnosis not present

## 2018-06-03 DIAGNOSIS — R296 Repeated falls: Secondary | ICD-10-CM | POA: Diagnosis not present

## 2018-06-03 DIAGNOSIS — R2689 Other abnormalities of gait and mobility: Secondary | ICD-10-CM | POA: Diagnosis not present

## 2018-06-04 DIAGNOSIS — E871 Hypo-osmolality and hyponatremia: Secondary | ICD-10-CM | POA: Diagnosis not present

## 2018-06-04 DIAGNOSIS — R296 Repeated falls: Secondary | ICD-10-CM | POA: Diagnosis not present

## 2018-06-04 DIAGNOSIS — R2689 Other abnormalities of gait and mobility: Secondary | ICD-10-CM | POA: Diagnosis not present

## 2018-06-04 DIAGNOSIS — R278 Other lack of coordination: Secondary | ICD-10-CM | POA: Diagnosis not present

## 2018-06-04 DIAGNOSIS — R2681 Unsteadiness on feet: Secondary | ICD-10-CM | POA: Diagnosis not present

## 2018-06-04 DIAGNOSIS — M6281 Muscle weakness (generalized): Secondary | ICD-10-CM | POA: Diagnosis not present

## 2018-06-05 DIAGNOSIS — R278 Other lack of coordination: Secondary | ICD-10-CM | POA: Diagnosis not present

## 2018-06-05 DIAGNOSIS — R296 Repeated falls: Secondary | ICD-10-CM | POA: Diagnosis not present

## 2018-06-05 DIAGNOSIS — R2681 Unsteadiness on feet: Secondary | ICD-10-CM | POA: Diagnosis not present

## 2018-06-05 DIAGNOSIS — R2689 Other abnormalities of gait and mobility: Secondary | ICD-10-CM | POA: Diagnosis not present

## 2018-06-05 DIAGNOSIS — M6281 Muscle weakness (generalized): Secondary | ICD-10-CM | POA: Diagnosis not present

## 2018-06-05 DIAGNOSIS — E871 Hypo-osmolality and hyponatremia: Secondary | ICD-10-CM | POA: Diagnosis not present

## 2018-06-06 DIAGNOSIS — M6281 Muscle weakness (generalized): Secondary | ICD-10-CM | POA: Diagnosis not present

## 2018-06-06 DIAGNOSIS — E871 Hypo-osmolality and hyponatremia: Secondary | ICD-10-CM | POA: Diagnosis not present

## 2018-06-06 DIAGNOSIS — R2681 Unsteadiness on feet: Secondary | ICD-10-CM | POA: Diagnosis not present

## 2018-06-06 DIAGNOSIS — R278 Other lack of coordination: Secondary | ICD-10-CM | POA: Diagnosis not present

## 2018-06-06 DIAGNOSIS — R2689 Other abnormalities of gait and mobility: Secondary | ICD-10-CM | POA: Diagnosis not present

## 2018-06-06 DIAGNOSIS — R296 Repeated falls: Secondary | ICD-10-CM | POA: Diagnosis not present

## 2018-06-07 DIAGNOSIS — R278 Other lack of coordination: Secondary | ICD-10-CM | POA: Diagnosis not present

## 2018-06-07 DIAGNOSIS — R296 Repeated falls: Secondary | ICD-10-CM | POA: Diagnosis not present

## 2018-06-07 DIAGNOSIS — M6281 Muscle weakness (generalized): Secondary | ICD-10-CM | POA: Diagnosis not present

## 2018-06-07 DIAGNOSIS — R2689 Other abnormalities of gait and mobility: Secondary | ICD-10-CM | POA: Diagnosis not present

## 2018-06-07 DIAGNOSIS — E871 Hypo-osmolality and hyponatremia: Secondary | ICD-10-CM | POA: Diagnosis not present

## 2018-06-07 DIAGNOSIS — R2681 Unsteadiness on feet: Secondary | ICD-10-CM | POA: Diagnosis not present

## 2018-06-08 DIAGNOSIS — E871 Hypo-osmolality and hyponatremia: Secondary | ICD-10-CM | POA: Diagnosis not present

## 2018-06-09 DIAGNOSIS — E871 Hypo-osmolality and hyponatremia: Secondary | ICD-10-CM | POA: Diagnosis not present

## 2018-06-10 DIAGNOSIS — M6281 Muscle weakness (generalized): Secondary | ICD-10-CM | POA: Diagnosis not present

## 2018-06-10 DIAGNOSIS — R2689 Other abnormalities of gait and mobility: Secondary | ICD-10-CM | POA: Diagnosis not present

## 2018-06-10 DIAGNOSIS — E871 Hypo-osmolality and hyponatremia: Secondary | ICD-10-CM | POA: Diagnosis not present

## 2018-06-10 DIAGNOSIS — R296 Repeated falls: Secondary | ICD-10-CM | POA: Diagnosis not present

## 2018-06-10 DIAGNOSIS — R278 Other lack of coordination: Secondary | ICD-10-CM | POA: Diagnosis not present

## 2018-06-10 DIAGNOSIS — R2681 Unsteadiness on feet: Secondary | ICD-10-CM | POA: Diagnosis not present

## 2018-06-11 ENCOUNTER — Ambulatory Visit (INDEPENDENT_AMBULATORY_CARE_PROVIDER_SITE_OTHER): Payer: Medicare HMO | Admitting: *Deleted

## 2018-06-11 DIAGNOSIS — I442 Atrioventricular block, complete: Secondary | ICD-10-CM | POA: Diagnosis not present

## 2018-06-11 DIAGNOSIS — R278 Other lack of coordination: Secondary | ICD-10-CM | POA: Diagnosis not present

## 2018-06-11 DIAGNOSIS — M6281 Muscle weakness (generalized): Secondary | ICD-10-CM | POA: Diagnosis not present

## 2018-06-11 DIAGNOSIS — R296 Repeated falls: Secondary | ICD-10-CM | POA: Diagnosis not present

## 2018-06-11 DIAGNOSIS — R2681 Unsteadiness on feet: Secondary | ICD-10-CM | POA: Diagnosis not present

## 2018-06-11 DIAGNOSIS — R2689 Other abnormalities of gait and mobility: Secondary | ICD-10-CM | POA: Diagnosis not present

## 2018-06-11 DIAGNOSIS — E871 Hypo-osmolality and hyponatremia: Secondary | ICD-10-CM | POA: Diagnosis not present

## 2018-06-12 DIAGNOSIS — E871 Hypo-osmolality and hyponatremia: Secondary | ICD-10-CM | POA: Diagnosis not present

## 2018-06-12 NOTE — Progress Notes (Signed)
Remote pacemaker transmission.   

## 2018-06-13 DIAGNOSIS — R2681 Unsteadiness on feet: Secondary | ICD-10-CM | POA: Diagnosis not present

## 2018-06-13 DIAGNOSIS — R278 Other lack of coordination: Secondary | ICD-10-CM | POA: Diagnosis not present

## 2018-06-13 DIAGNOSIS — E871 Hypo-osmolality and hyponatremia: Secondary | ICD-10-CM | POA: Diagnosis not present

## 2018-06-13 DIAGNOSIS — R296 Repeated falls: Secondary | ICD-10-CM | POA: Diagnosis not present

## 2018-06-13 DIAGNOSIS — R2689 Other abnormalities of gait and mobility: Secondary | ICD-10-CM | POA: Diagnosis not present

## 2018-06-13 DIAGNOSIS — M6281 Muscle weakness (generalized): Secondary | ICD-10-CM | POA: Diagnosis not present

## 2018-06-14 DIAGNOSIS — R2689 Other abnormalities of gait and mobility: Secondary | ICD-10-CM | POA: Diagnosis not present

## 2018-06-14 DIAGNOSIS — E871 Hypo-osmolality and hyponatremia: Secondary | ICD-10-CM | POA: Diagnosis not present

## 2018-06-14 DIAGNOSIS — R296 Repeated falls: Secondary | ICD-10-CM | POA: Diagnosis not present

## 2018-06-14 DIAGNOSIS — R2681 Unsteadiness on feet: Secondary | ICD-10-CM | POA: Diagnosis not present

## 2018-06-14 DIAGNOSIS — M6281 Muscle weakness (generalized): Secondary | ICD-10-CM | POA: Diagnosis not present

## 2018-06-14 DIAGNOSIS — R278 Other lack of coordination: Secondary | ICD-10-CM | POA: Diagnosis not present

## 2018-06-15 DIAGNOSIS — E871 Hypo-osmolality and hyponatremia: Secondary | ICD-10-CM | POA: Diagnosis not present

## 2018-06-16 DIAGNOSIS — E871 Hypo-osmolality and hyponatremia: Secondary | ICD-10-CM | POA: Diagnosis not present

## 2018-06-17 DIAGNOSIS — E871 Hypo-osmolality and hyponatremia: Secondary | ICD-10-CM | POA: Diagnosis not present

## 2018-06-18 DIAGNOSIS — R296 Repeated falls: Secondary | ICD-10-CM | POA: Diagnosis not present

## 2018-06-18 DIAGNOSIS — E871 Hypo-osmolality and hyponatremia: Secondary | ICD-10-CM | POA: Diagnosis not present

## 2018-06-18 DIAGNOSIS — R2681 Unsteadiness on feet: Secondary | ICD-10-CM | POA: Diagnosis not present

## 2018-06-18 DIAGNOSIS — R278 Other lack of coordination: Secondary | ICD-10-CM | POA: Diagnosis not present

## 2018-06-18 DIAGNOSIS — M6281 Muscle weakness (generalized): Secondary | ICD-10-CM | POA: Diagnosis not present

## 2018-06-18 DIAGNOSIS — R2689 Other abnormalities of gait and mobility: Secondary | ICD-10-CM | POA: Diagnosis not present

## 2018-06-19 DIAGNOSIS — R2689 Other abnormalities of gait and mobility: Secondary | ICD-10-CM | POA: Diagnosis not present

## 2018-06-19 DIAGNOSIS — E871 Hypo-osmolality and hyponatremia: Secondary | ICD-10-CM | POA: Diagnosis not present

## 2018-06-19 DIAGNOSIS — R2681 Unsteadiness on feet: Secondary | ICD-10-CM | POA: Diagnosis not present

## 2018-06-19 DIAGNOSIS — R278 Other lack of coordination: Secondary | ICD-10-CM | POA: Diagnosis not present

## 2018-06-19 DIAGNOSIS — M6281 Muscle weakness (generalized): Secondary | ICD-10-CM | POA: Diagnosis not present

## 2018-06-19 DIAGNOSIS — R296 Repeated falls: Secondary | ICD-10-CM | POA: Diagnosis not present

## 2018-06-20 DIAGNOSIS — R2689 Other abnormalities of gait and mobility: Secondary | ICD-10-CM | POA: Diagnosis not present

## 2018-06-20 DIAGNOSIS — R2681 Unsteadiness on feet: Secondary | ICD-10-CM | POA: Diagnosis not present

## 2018-06-20 DIAGNOSIS — R278 Other lack of coordination: Secondary | ICD-10-CM | POA: Diagnosis not present

## 2018-06-20 DIAGNOSIS — E871 Hypo-osmolality and hyponatremia: Secondary | ICD-10-CM | POA: Diagnosis not present

## 2018-06-20 DIAGNOSIS — M6281 Muscle weakness (generalized): Secondary | ICD-10-CM | POA: Diagnosis not present

## 2018-06-20 DIAGNOSIS — R296 Repeated falls: Secondary | ICD-10-CM | POA: Diagnosis not present

## 2018-06-21 DIAGNOSIS — E871 Hypo-osmolality and hyponatremia: Secondary | ICD-10-CM | POA: Diagnosis not present

## 2018-06-22 DIAGNOSIS — E871 Hypo-osmolality and hyponatremia: Secondary | ICD-10-CM | POA: Diagnosis not present

## 2018-06-23 DIAGNOSIS — E871 Hypo-osmolality and hyponatremia: Secondary | ICD-10-CM | POA: Diagnosis not present

## 2018-06-24 DIAGNOSIS — R2681 Unsteadiness on feet: Secondary | ICD-10-CM | POA: Diagnosis not present

## 2018-06-24 DIAGNOSIS — R296 Repeated falls: Secondary | ICD-10-CM | POA: Diagnosis not present

## 2018-06-24 DIAGNOSIS — E871 Hypo-osmolality and hyponatremia: Secondary | ICD-10-CM | POA: Diagnosis not present

## 2018-06-24 DIAGNOSIS — R278 Other lack of coordination: Secondary | ICD-10-CM | POA: Diagnosis not present

## 2018-06-24 DIAGNOSIS — R2689 Other abnormalities of gait and mobility: Secondary | ICD-10-CM | POA: Diagnosis not present

## 2018-06-24 DIAGNOSIS — M6281 Muscle weakness (generalized): Secondary | ICD-10-CM | POA: Diagnosis not present

## 2018-06-25 DIAGNOSIS — R2681 Unsteadiness on feet: Secondary | ICD-10-CM | POA: Diagnosis not present

## 2018-06-25 DIAGNOSIS — R278 Other lack of coordination: Secondary | ICD-10-CM | POA: Diagnosis not present

## 2018-06-25 DIAGNOSIS — R296 Repeated falls: Secondary | ICD-10-CM | POA: Diagnosis not present

## 2018-06-25 DIAGNOSIS — R2689 Other abnormalities of gait and mobility: Secondary | ICD-10-CM | POA: Diagnosis not present

## 2018-06-25 DIAGNOSIS — M6281 Muscle weakness (generalized): Secondary | ICD-10-CM | POA: Diagnosis not present

## 2018-06-25 DIAGNOSIS — E871 Hypo-osmolality and hyponatremia: Secondary | ICD-10-CM | POA: Diagnosis not present

## 2018-06-26 DIAGNOSIS — R278 Other lack of coordination: Secondary | ICD-10-CM | POA: Diagnosis not present

## 2018-06-26 DIAGNOSIS — R296 Repeated falls: Secondary | ICD-10-CM | POA: Diagnosis not present

## 2018-06-26 DIAGNOSIS — R2689 Other abnormalities of gait and mobility: Secondary | ICD-10-CM | POA: Diagnosis not present

## 2018-06-26 DIAGNOSIS — R2681 Unsteadiness on feet: Secondary | ICD-10-CM | POA: Diagnosis not present

## 2018-06-26 DIAGNOSIS — E871 Hypo-osmolality and hyponatremia: Secondary | ICD-10-CM | POA: Diagnosis not present

## 2018-06-26 DIAGNOSIS — M6281 Muscle weakness (generalized): Secondary | ICD-10-CM | POA: Diagnosis not present

## 2018-06-27 DIAGNOSIS — E871 Hypo-osmolality and hyponatremia: Secondary | ICD-10-CM | POA: Diagnosis not present

## 2018-06-27 DIAGNOSIS — R278 Other lack of coordination: Secondary | ICD-10-CM | POA: Diagnosis not present

## 2018-06-27 DIAGNOSIS — M6281 Muscle weakness (generalized): Secondary | ICD-10-CM | POA: Diagnosis not present

## 2018-06-27 DIAGNOSIS — R296 Repeated falls: Secondary | ICD-10-CM | POA: Diagnosis not present

## 2018-06-27 DIAGNOSIS — R2681 Unsteadiness on feet: Secondary | ICD-10-CM | POA: Diagnosis not present

## 2018-06-27 DIAGNOSIS — R2689 Other abnormalities of gait and mobility: Secondary | ICD-10-CM | POA: Diagnosis not present

## 2018-06-28 DIAGNOSIS — E871 Hypo-osmolality and hyponatremia: Secondary | ICD-10-CM | POA: Diagnosis not present

## 2018-06-28 DIAGNOSIS — K119 Disease of salivary gland, unspecified: Secondary | ICD-10-CM | POA: Diagnosis not present

## 2018-06-29 DIAGNOSIS — E871 Hypo-osmolality and hyponatremia: Secondary | ICD-10-CM | POA: Diagnosis not present

## 2018-06-30 DIAGNOSIS — E871 Hypo-osmolality and hyponatremia: Secondary | ICD-10-CM | POA: Diagnosis not present

## 2018-07-01 DIAGNOSIS — R296 Repeated falls: Secondary | ICD-10-CM | POA: Diagnosis not present

## 2018-07-01 DIAGNOSIS — R2681 Unsteadiness on feet: Secondary | ICD-10-CM | POA: Diagnosis not present

## 2018-07-01 DIAGNOSIS — M6281 Muscle weakness (generalized): Secondary | ICD-10-CM | POA: Diagnosis not present

## 2018-07-01 DIAGNOSIS — E871 Hypo-osmolality and hyponatremia: Secondary | ICD-10-CM | POA: Diagnosis not present

## 2018-07-01 DIAGNOSIS — R278 Other lack of coordination: Secondary | ICD-10-CM | POA: Diagnosis not present

## 2018-07-01 DIAGNOSIS — R2689 Other abnormalities of gait and mobility: Secondary | ICD-10-CM | POA: Diagnosis not present

## 2018-07-02 DIAGNOSIS — R278 Other lack of coordination: Secondary | ICD-10-CM | POA: Diagnosis not present

## 2018-07-02 DIAGNOSIS — R2681 Unsteadiness on feet: Secondary | ICD-10-CM | POA: Diagnosis not present

## 2018-07-02 DIAGNOSIS — R296 Repeated falls: Secondary | ICD-10-CM | POA: Diagnosis not present

## 2018-07-02 DIAGNOSIS — E871 Hypo-osmolality and hyponatremia: Secondary | ICD-10-CM | POA: Diagnosis not present

## 2018-07-02 DIAGNOSIS — M6281 Muscle weakness (generalized): Secondary | ICD-10-CM | POA: Diagnosis not present

## 2018-07-02 DIAGNOSIS — R2689 Other abnormalities of gait and mobility: Secondary | ICD-10-CM | POA: Diagnosis not present

## 2018-07-02 LAB — CUP PACEART REMOTE DEVICE CHECK
Battery Impedance: 182 Ohm
Battery Remaining Longevity: 129 mo
Battery Voltage: 2.79 V
Brady Statistic AP VP Percent: 45 %
Brady Statistic AP VS Percent: 0 %
Brady Statistic AS VP Percent: 55 %
Brady Statistic AS VS Percent: 0 %
Date Time Interrogation Session: 20190806134446
Implantable Lead Implant Date: 20160902
Implantable Lead Implant Date: 20160902
Implantable Lead Location: 753859
Implantable Lead Location: 753860
Implantable Lead Model: 5076
Implantable Lead Model: 5076
Implantable Pulse Generator Implant Date: 20160902
Lead Channel Impedance Value: 478 Ohm
Lead Channel Impedance Value: 546 Ohm
Lead Channel Pacing Threshold Amplitude: 0.75 V
Lead Channel Pacing Threshold Amplitude: 1 V
Lead Channel Pacing Threshold Pulse Width: 0.4 ms
Lead Channel Pacing Threshold Pulse Width: 0.4 ms
Lead Channel Setting Pacing Amplitude: 1.5 V
Lead Channel Setting Pacing Amplitude: 2 V
Lead Channel Setting Pacing Pulse Width: 0.4 ms
Lead Channel Setting Sensing Sensitivity: 4 mV

## 2018-07-03 ENCOUNTER — Other Ambulatory Visit: Payer: Self-pay | Admitting: Otolaryngology

## 2018-07-03 DIAGNOSIS — K118 Other diseases of salivary glands: Secondary | ICD-10-CM

## 2018-07-03 DIAGNOSIS — E871 Hypo-osmolality and hyponatremia: Secondary | ICD-10-CM | POA: Diagnosis not present

## 2018-07-04 DIAGNOSIS — M6281 Muscle weakness (generalized): Secondary | ICD-10-CM | POA: Diagnosis not present

## 2018-07-04 DIAGNOSIS — R278 Other lack of coordination: Secondary | ICD-10-CM | POA: Diagnosis not present

## 2018-07-04 DIAGNOSIS — R2689 Other abnormalities of gait and mobility: Secondary | ICD-10-CM | POA: Diagnosis not present

## 2018-07-04 DIAGNOSIS — E871 Hypo-osmolality and hyponatremia: Secondary | ICD-10-CM | POA: Diagnosis not present

## 2018-07-04 DIAGNOSIS — R296 Repeated falls: Secondary | ICD-10-CM | POA: Diagnosis not present

## 2018-07-04 DIAGNOSIS — R2681 Unsteadiness on feet: Secondary | ICD-10-CM | POA: Diagnosis not present

## 2018-07-05 DIAGNOSIS — E871 Hypo-osmolality and hyponatremia: Secondary | ICD-10-CM | POA: Diagnosis not present

## 2018-07-05 DIAGNOSIS — R296 Repeated falls: Secondary | ICD-10-CM | POA: Diagnosis not present

## 2018-07-05 DIAGNOSIS — R2681 Unsteadiness on feet: Secondary | ICD-10-CM | POA: Diagnosis not present

## 2018-07-05 DIAGNOSIS — R2689 Other abnormalities of gait and mobility: Secondary | ICD-10-CM | POA: Diagnosis not present

## 2018-07-05 DIAGNOSIS — M6281 Muscle weakness (generalized): Secondary | ICD-10-CM | POA: Diagnosis not present

## 2018-07-05 DIAGNOSIS — R278 Other lack of coordination: Secondary | ICD-10-CM | POA: Diagnosis not present

## 2018-07-06 DIAGNOSIS — E871 Hypo-osmolality and hyponatremia: Secondary | ICD-10-CM | POA: Diagnosis not present

## 2018-07-07 DIAGNOSIS — E871 Hypo-osmolality and hyponatremia: Secondary | ICD-10-CM | POA: Diagnosis not present

## 2018-07-08 DIAGNOSIS — E871 Hypo-osmolality and hyponatremia: Secondary | ICD-10-CM | POA: Diagnosis not present

## 2018-07-09 DIAGNOSIS — E871 Hypo-osmolality and hyponatremia: Secondary | ICD-10-CM | POA: Diagnosis not present

## 2018-07-09 DIAGNOSIS — R296 Repeated falls: Secondary | ICD-10-CM | POA: Diagnosis not present

## 2018-07-09 DIAGNOSIS — R2689 Other abnormalities of gait and mobility: Secondary | ICD-10-CM | POA: Diagnosis not present

## 2018-07-09 DIAGNOSIS — R278 Other lack of coordination: Secondary | ICD-10-CM | POA: Diagnosis not present

## 2018-07-09 DIAGNOSIS — R2681 Unsteadiness on feet: Secondary | ICD-10-CM | POA: Diagnosis not present

## 2018-07-09 DIAGNOSIS — M6281 Muscle weakness (generalized): Secondary | ICD-10-CM | POA: Diagnosis not present

## 2018-07-10 DIAGNOSIS — R2681 Unsteadiness on feet: Secondary | ICD-10-CM | POA: Diagnosis not present

## 2018-07-10 DIAGNOSIS — E871 Hypo-osmolality and hyponatremia: Secondary | ICD-10-CM | POA: Diagnosis not present

## 2018-07-10 DIAGNOSIS — R278 Other lack of coordination: Secondary | ICD-10-CM | POA: Diagnosis not present

## 2018-07-10 DIAGNOSIS — R2689 Other abnormalities of gait and mobility: Secondary | ICD-10-CM | POA: Diagnosis not present

## 2018-07-10 DIAGNOSIS — M6281 Muscle weakness (generalized): Secondary | ICD-10-CM | POA: Diagnosis not present

## 2018-07-10 DIAGNOSIS — R296 Repeated falls: Secondary | ICD-10-CM | POA: Diagnosis not present

## 2018-07-11 DIAGNOSIS — R278 Other lack of coordination: Secondary | ICD-10-CM | POA: Diagnosis not present

## 2018-07-11 DIAGNOSIS — R296 Repeated falls: Secondary | ICD-10-CM | POA: Diagnosis not present

## 2018-07-11 DIAGNOSIS — E871 Hypo-osmolality and hyponatremia: Secondary | ICD-10-CM | POA: Diagnosis not present

## 2018-07-11 DIAGNOSIS — M6281 Muscle weakness (generalized): Secondary | ICD-10-CM | POA: Diagnosis not present

## 2018-07-11 DIAGNOSIS — R2681 Unsteadiness on feet: Secondary | ICD-10-CM | POA: Diagnosis not present

## 2018-07-11 DIAGNOSIS — R2689 Other abnormalities of gait and mobility: Secondary | ICD-10-CM | POA: Diagnosis not present

## 2018-07-12 ENCOUNTER — Inpatient Hospital Stay
Admission: RE | Admit: 2018-07-12 | Discharge: 2018-07-12 | Disposition: A | Discharge status: Home / Self Care | Payer: Medicare HMO | Source: Ambulatory Visit | Attending: Otolaryngology | Admitting: Otolaryngology

## 2018-07-12 DIAGNOSIS — E871 Hypo-osmolality and hyponatremia: Secondary | ICD-10-CM | POA: Diagnosis not present

## 2018-07-13 DIAGNOSIS — E871 Hypo-osmolality and hyponatremia: Secondary | ICD-10-CM | POA: Diagnosis not present

## 2018-07-14 DIAGNOSIS — E871 Hypo-osmolality and hyponatremia: Secondary | ICD-10-CM | POA: Diagnosis not present

## 2018-07-16 DIAGNOSIS — R2689 Other abnormalities of gait and mobility: Secondary | ICD-10-CM | POA: Diagnosis not present

## 2018-07-16 DIAGNOSIS — R296 Repeated falls: Secondary | ICD-10-CM | POA: Diagnosis not present

## 2018-07-16 DIAGNOSIS — R2681 Unsteadiness on feet: Secondary | ICD-10-CM | POA: Diagnosis not present

## 2018-07-16 DIAGNOSIS — M6281 Muscle weakness (generalized): Secondary | ICD-10-CM | POA: Diagnosis not present

## 2018-07-16 DIAGNOSIS — R278 Other lack of coordination: Secondary | ICD-10-CM | POA: Diagnosis not present

## 2018-07-17 DIAGNOSIS — R278 Other lack of coordination: Secondary | ICD-10-CM | POA: Diagnosis not present

## 2018-07-17 DIAGNOSIS — M6281 Muscle weakness (generalized): Secondary | ICD-10-CM | POA: Diagnosis not present

## 2018-07-17 DIAGNOSIS — R296 Repeated falls: Secondary | ICD-10-CM | POA: Diagnosis not present

## 2018-07-17 DIAGNOSIS — R2681 Unsteadiness on feet: Secondary | ICD-10-CM | POA: Diagnosis not present

## 2018-07-17 DIAGNOSIS — R2689 Other abnormalities of gait and mobility: Secondary | ICD-10-CM | POA: Diagnosis not present

## 2018-07-19 DIAGNOSIS — M6281 Muscle weakness (generalized): Secondary | ICD-10-CM | POA: Diagnosis not present

## 2018-07-19 DIAGNOSIS — R296 Repeated falls: Secondary | ICD-10-CM | POA: Diagnosis not present

## 2018-07-19 DIAGNOSIS — R2681 Unsteadiness on feet: Secondary | ICD-10-CM | POA: Diagnosis not present

## 2018-07-19 DIAGNOSIS — R2689 Other abnormalities of gait and mobility: Secondary | ICD-10-CM | POA: Diagnosis not present

## 2018-07-19 DIAGNOSIS — R278 Other lack of coordination: Secondary | ICD-10-CM | POA: Diagnosis not present

## 2018-07-22 DIAGNOSIS — E871 Hypo-osmolality and hyponatremia: Secondary | ICD-10-CM | POA: Diagnosis not present

## 2018-07-23 DIAGNOSIS — M6281 Muscle weakness (generalized): Secondary | ICD-10-CM | POA: Diagnosis not present

## 2018-07-23 DIAGNOSIS — E871 Hypo-osmolality and hyponatremia: Secondary | ICD-10-CM | POA: Diagnosis not present

## 2018-07-23 DIAGNOSIS — R2689 Other abnormalities of gait and mobility: Secondary | ICD-10-CM | POA: Diagnosis not present

## 2018-07-23 DIAGNOSIS — R278 Other lack of coordination: Secondary | ICD-10-CM | POA: Diagnosis not present

## 2018-07-23 DIAGNOSIS — R296 Repeated falls: Secondary | ICD-10-CM | POA: Diagnosis not present

## 2018-07-23 DIAGNOSIS — R2681 Unsteadiness on feet: Secondary | ICD-10-CM | POA: Diagnosis not present

## 2018-07-24 DIAGNOSIS — R296 Repeated falls: Secondary | ICD-10-CM | POA: Diagnosis not present

## 2018-07-24 DIAGNOSIS — E871 Hypo-osmolality and hyponatremia: Secondary | ICD-10-CM | POA: Diagnosis not present

## 2018-07-24 DIAGNOSIS — R278 Other lack of coordination: Secondary | ICD-10-CM | POA: Diagnosis not present

## 2018-07-24 DIAGNOSIS — M6281 Muscle weakness (generalized): Secondary | ICD-10-CM | POA: Diagnosis not present

## 2018-07-24 DIAGNOSIS — R2689 Other abnormalities of gait and mobility: Secondary | ICD-10-CM | POA: Diagnosis not present

## 2018-07-24 DIAGNOSIS — R2681 Unsteadiness on feet: Secondary | ICD-10-CM | POA: Diagnosis not present

## 2018-07-25 ENCOUNTER — Ambulatory Visit
Admission: RE | Admit: 2018-07-25 | Discharge: 2018-07-25 | Disposition: A | Discharge status: Home / Self Care | Payer: Medicare HMO | Source: Ambulatory Visit | Attending: Otolaryngology | Admitting: Otolaryngology

## 2018-07-25 DIAGNOSIS — R221 Localized swelling, mass and lump, neck: Secondary | ICD-10-CM | POA: Diagnosis not present

## 2018-07-25 DIAGNOSIS — K118 Other diseases of salivary glands: Secondary | ICD-10-CM

## 2018-07-25 DIAGNOSIS — M6281 Muscle weakness (generalized): Secondary | ICD-10-CM | POA: Diagnosis not present

## 2018-07-25 DIAGNOSIS — E871 Hypo-osmolality and hyponatremia: Secondary | ICD-10-CM | POA: Diagnosis not present

## 2018-07-25 DIAGNOSIS — R278 Other lack of coordination: Secondary | ICD-10-CM | POA: Diagnosis not present

## 2018-07-25 DIAGNOSIS — R2689 Other abnormalities of gait and mobility: Secondary | ICD-10-CM | POA: Diagnosis not present

## 2018-07-25 DIAGNOSIS — R2681 Unsteadiness on feet: Secondary | ICD-10-CM | POA: Diagnosis not present

## 2018-07-25 DIAGNOSIS — R296 Repeated falls: Secondary | ICD-10-CM | POA: Diagnosis not present

## 2018-07-25 MED ORDER — IOPAMIDOL (ISOVUE-300) INJECTION 61%
75.0000 mL | Freq: Once | INTRAVENOUS | Status: AC | PRN
  Administered 2018-07-25: 75 mL via INTRAVENOUS

## 2018-07-26 DIAGNOSIS — R2681 Unsteadiness on feet: Secondary | ICD-10-CM | POA: Diagnosis not present

## 2018-07-26 DIAGNOSIS — M6281 Muscle weakness (generalized): Secondary | ICD-10-CM | POA: Diagnosis not present

## 2018-07-26 DIAGNOSIS — R296 Repeated falls: Secondary | ICD-10-CM | POA: Diagnosis not present

## 2018-07-26 DIAGNOSIS — R278 Other lack of coordination: Secondary | ICD-10-CM | POA: Diagnosis not present

## 2018-07-26 DIAGNOSIS — E871 Hypo-osmolality and hyponatremia: Secondary | ICD-10-CM | POA: Diagnosis not present

## 2018-07-26 DIAGNOSIS — R2689 Other abnormalities of gait and mobility: Secondary | ICD-10-CM | POA: Diagnosis not present

## 2018-07-27 DIAGNOSIS — E871 Hypo-osmolality and hyponatremia: Secondary | ICD-10-CM | POA: Diagnosis not present

## 2018-07-28 DIAGNOSIS — E871 Hypo-osmolality and hyponatremia: Secondary | ICD-10-CM | POA: Diagnosis not present

## 2018-07-29 DIAGNOSIS — R2689 Other abnormalities of gait and mobility: Secondary | ICD-10-CM | POA: Diagnosis not present

## 2018-07-29 DIAGNOSIS — R278 Other lack of coordination: Secondary | ICD-10-CM | POA: Diagnosis not present

## 2018-07-29 DIAGNOSIS — R296 Repeated falls: Secondary | ICD-10-CM | POA: Diagnosis not present

## 2018-07-29 DIAGNOSIS — R2681 Unsteadiness on feet: Secondary | ICD-10-CM | POA: Diagnosis not present

## 2018-07-29 DIAGNOSIS — M6281 Muscle weakness (generalized): Secondary | ICD-10-CM | POA: Diagnosis not present

## 2018-07-29 DIAGNOSIS — E871 Hypo-osmolality and hyponatremia: Secondary | ICD-10-CM | POA: Diagnosis not present

## 2018-07-30 DIAGNOSIS — R2681 Unsteadiness on feet: Secondary | ICD-10-CM | POA: Diagnosis not present

## 2018-07-30 DIAGNOSIS — R2689 Other abnormalities of gait and mobility: Secondary | ICD-10-CM | POA: Diagnosis not present

## 2018-07-30 DIAGNOSIS — R296 Repeated falls: Secondary | ICD-10-CM | POA: Diagnosis not present

## 2018-07-30 DIAGNOSIS — R278 Other lack of coordination: Secondary | ICD-10-CM | POA: Diagnosis not present

## 2018-07-30 DIAGNOSIS — M6281 Muscle weakness (generalized): Secondary | ICD-10-CM | POA: Diagnosis not present

## 2018-07-30 DIAGNOSIS — E871 Hypo-osmolality and hyponatremia: Secondary | ICD-10-CM | POA: Diagnosis not present

## 2018-07-31 DIAGNOSIS — R2689 Other abnormalities of gait and mobility: Secondary | ICD-10-CM | POA: Diagnosis not present

## 2018-07-31 DIAGNOSIS — R2681 Unsteadiness on feet: Secondary | ICD-10-CM | POA: Diagnosis not present

## 2018-07-31 DIAGNOSIS — R278 Other lack of coordination: Secondary | ICD-10-CM | POA: Diagnosis not present

## 2018-07-31 DIAGNOSIS — E871 Hypo-osmolality and hyponatremia: Secondary | ICD-10-CM | POA: Diagnosis not present

## 2018-07-31 DIAGNOSIS — M6281 Muscle weakness (generalized): Secondary | ICD-10-CM | POA: Diagnosis not present

## 2018-07-31 DIAGNOSIS — R296 Repeated falls: Secondary | ICD-10-CM | POA: Diagnosis not present

## 2018-08-01 DIAGNOSIS — E871 Hypo-osmolality and hyponatremia: Secondary | ICD-10-CM | POA: Diagnosis not present

## 2018-08-01 DIAGNOSIS — M6281 Muscle weakness (generalized): Secondary | ICD-10-CM | POA: Diagnosis not present

## 2018-08-01 DIAGNOSIS — R296 Repeated falls: Secondary | ICD-10-CM | POA: Diagnosis not present

## 2018-08-01 DIAGNOSIS — R2689 Other abnormalities of gait and mobility: Secondary | ICD-10-CM | POA: Diagnosis not present

## 2018-08-01 DIAGNOSIS — R2681 Unsteadiness on feet: Secondary | ICD-10-CM | POA: Diagnosis not present

## 2018-08-01 DIAGNOSIS — R278 Other lack of coordination: Secondary | ICD-10-CM | POA: Diagnosis not present

## 2018-08-02 DIAGNOSIS — R2681 Unsteadiness on feet: Secondary | ICD-10-CM | POA: Diagnosis not present

## 2018-08-02 DIAGNOSIS — R278 Other lack of coordination: Secondary | ICD-10-CM | POA: Diagnosis not present

## 2018-08-02 DIAGNOSIS — R2689 Other abnormalities of gait and mobility: Secondary | ICD-10-CM | POA: Diagnosis not present

## 2018-08-02 DIAGNOSIS — M6281 Muscle weakness (generalized): Secondary | ICD-10-CM | POA: Diagnosis not present

## 2018-08-02 DIAGNOSIS — R296 Repeated falls: Secondary | ICD-10-CM | POA: Diagnosis not present

## 2018-08-02 DIAGNOSIS — E871 Hypo-osmolality and hyponatremia: Secondary | ICD-10-CM | POA: Diagnosis not present

## 2018-08-03 DIAGNOSIS — E871 Hypo-osmolality and hyponatremia: Secondary | ICD-10-CM | POA: Diagnosis not present

## 2018-08-04 DIAGNOSIS — E871 Hypo-osmolality and hyponatremia: Secondary | ICD-10-CM | POA: Diagnosis not present

## 2018-08-05 DIAGNOSIS — R2681 Unsteadiness on feet: Secondary | ICD-10-CM | POA: Diagnosis not present

## 2018-08-05 DIAGNOSIS — R2689 Other abnormalities of gait and mobility: Secondary | ICD-10-CM | POA: Diagnosis not present

## 2018-08-05 DIAGNOSIS — E871 Hypo-osmolality and hyponatremia: Secondary | ICD-10-CM | POA: Diagnosis not present

## 2018-08-05 DIAGNOSIS — R278 Other lack of coordination: Secondary | ICD-10-CM | POA: Diagnosis not present

## 2018-08-05 DIAGNOSIS — M6281 Muscle weakness (generalized): Secondary | ICD-10-CM | POA: Diagnosis not present

## 2018-08-05 DIAGNOSIS — R296 Repeated falls: Secondary | ICD-10-CM | POA: Diagnosis not present

## 2018-08-06 DIAGNOSIS — R2689 Other abnormalities of gait and mobility: Secondary | ICD-10-CM | POA: Diagnosis not present

## 2018-08-06 DIAGNOSIS — M6281 Muscle weakness (generalized): Secondary | ICD-10-CM | POA: Diagnosis not present

## 2018-08-06 DIAGNOSIS — E871 Hypo-osmolality and hyponatremia: Secondary | ICD-10-CM | POA: Diagnosis not present

## 2018-08-06 DIAGNOSIS — R296 Repeated falls: Secondary | ICD-10-CM | POA: Diagnosis not present

## 2018-08-06 DIAGNOSIS — R278 Other lack of coordination: Secondary | ICD-10-CM | POA: Diagnosis not present

## 2018-08-06 DIAGNOSIS — R2681 Unsteadiness on feet: Secondary | ICD-10-CM | POA: Diagnosis not present

## 2018-08-07 DIAGNOSIS — E871 Hypo-osmolality and hyponatremia: Secondary | ICD-10-CM | POA: Diagnosis not present

## 2018-08-08 DIAGNOSIS — R278 Other lack of coordination: Secondary | ICD-10-CM | POA: Diagnosis not present

## 2018-08-08 DIAGNOSIS — E871 Hypo-osmolality and hyponatremia: Secondary | ICD-10-CM | POA: Diagnosis not present

## 2018-08-08 DIAGNOSIS — R2681 Unsteadiness on feet: Secondary | ICD-10-CM | POA: Diagnosis not present

## 2018-08-08 DIAGNOSIS — R296 Repeated falls: Secondary | ICD-10-CM | POA: Diagnosis not present

## 2018-08-08 DIAGNOSIS — R2689 Other abnormalities of gait and mobility: Secondary | ICD-10-CM | POA: Diagnosis not present

## 2018-08-08 DIAGNOSIS — M6281 Muscle weakness (generalized): Secondary | ICD-10-CM | POA: Diagnosis not present

## 2018-08-09 DIAGNOSIS — M6281 Muscle weakness (generalized): Secondary | ICD-10-CM | POA: Diagnosis not present

## 2018-08-09 DIAGNOSIS — R2681 Unsteadiness on feet: Secondary | ICD-10-CM | POA: Diagnosis not present

## 2018-08-09 DIAGNOSIS — E871 Hypo-osmolality and hyponatremia: Secondary | ICD-10-CM | POA: Diagnosis not present

## 2018-08-09 DIAGNOSIS — R296 Repeated falls: Secondary | ICD-10-CM | POA: Diagnosis not present

## 2018-08-09 DIAGNOSIS — R2689 Other abnormalities of gait and mobility: Secondary | ICD-10-CM | POA: Diagnosis not present

## 2018-08-09 DIAGNOSIS — R278 Other lack of coordination: Secondary | ICD-10-CM | POA: Diagnosis not present

## 2018-08-10 DIAGNOSIS — E871 Hypo-osmolality and hyponatremia: Secondary | ICD-10-CM | POA: Diagnosis not present

## 2018-08-11 DIAGNOSIS — E871 Hypo-osmolality and hyponatremia: Secondary | ICD-10-CM | POA: Diagnosis not present

## 2018-08-12 DIAGNOSIS — R2681 Unsteadiness on feet: Secondary | ICD-10-CM | POA: Diagnosis not present

## 2018-08-12 DIAGNOSIS — R296 Repeated falls: Secondary | ICD-10-CM | POA: Diagnosis not present

## 2018-08-12 DIAGNOSIS — R278 Other lack of coordination: Secondary | ICD-10-CM | POA: Diagnosis not present

## 2018-08-12 DIAGNOSIS — M6281 Muscle weakness (generalized): Secondary | ICD-10-CM | POA: Diagnosis not present

## 2018-08-12 DIAGNOSIS — R2689 Other abnormalities of gait and mobility: Secondary | ICD-10-CM | POA: Diagnosis not present

## 2018-08-12 DIAGNOSIS — E871 Hypo-osmolality and hyponatremia: Secondary | ICD-10-CM | POA: Diagnosis not present

## 2018-08-13 ENCOUNTER — Encounter (HOSPITAL_COMMUNITY): Payer: Self-pay

## 2018-08-13 DIAGNOSIS — R2689 Other abnormalities of gait and mobility: Secondary | ICD-10-CM | POA: Diagnosis not present

## 2018-08-13 DIAGNOSIS — R278 Other lack of coordination: Secondary | ICD-10-CM | POA: Diagnosis not present

## 2018-08-13 DIAGNOSIS — R296 Repeated falls: Secondary | ICD-10-CM | POA: Diagnosis not present

## 2018-08-13 DIAGNOSIS — E871 Hypo-osmolality and hyponatremia: Secondary | ICD-10-CM | POA: Diagnosis not present

## 2018-08-13 DIAGNOSIS — M6281 Muscle weakness (generalized): Secondary | ICD-10-CM | POA: Diagnosis not present

## 2018-08-13 DIAGNOSIS — R2681 Unsteadiness on feet: Secondary | ICD-10-CM | POA: Diagnosis not present

## 2018-08-13 NOTE — Pre-Procedure Instructions (Signed)
Francisco [name]  08/13/2018      [facility] [place], [place] [address] Phone: [phone] Fax: [phone]    Your procedure is scheduled on 08/21/2018.  Report to [facility] Admitting at 0830 A.M.  Call this number if you have problems the morning of surgery:  [phone]   Remember:  Do not eat or drink after midnight.     Take these medicines the morning of surgery with A SIP OF WATER: Allopurinol (Zyloprim) Colchicine - if needed Fluticasone (FLonase) nasal spray Guaifenesin (Mucinex) - if needed Levetiracetam (Keppra) Metoprolol tartrate (Lopressor) Pantoprazole (Protonix) tamsulosin (Flomax)  7 days prior to surgery STOP taking any Aspirin (unless otherwise instructed by your surgeon), Aleve, Naproxen, Ibuprofen, Motrin, Advil, Goody's, BC's, all herbal medications, fish oil, and all vitamins     Do not wear jewelry.  Do not wear lotions, powders, or colognes, or deodorant.  Men may shave face and neck.  Do not bring valuables to the hospital.  [facility] is not responsible for any belongings or valuables.  Contacts, eyeglasses, hearing aids, dentures or bridgework may not be worn into surgery.  Leave your suitcase in the car.  After surgery it may be brought to your room.  For patients admitted to the hospital, discharge time will be determined by your treatment team.  Patients discharged the day of surgery will not be allowed to drive home.   Name and phone number of your driver:    Special instructions:   [place]- Preparing For Surgery  Before surgery, you can play an important role. Because skin is not sterile, your skin needs to be as free of germs as possible. You can reduce the number of germs on your skin by washing with CHG (chlorahexidine gluconate) Soap before surgery.  CHG is an antiseptic cleaner which kills germs and  bonds with the skin to continue killing germs even after washing.    Oral Hygiene is also important to reduce your risk of infection.  Remember - BRUSH YOUR TEETH THE MORNING OF SURGERY WITH YOUR REGULAR TOOTHPASTE  Please do not use if you have an allergy to CHG or antibacterial soaps. If your skin becomes reddened/irritated stop using the CHG.  Do not shave (including legs and underarms) for at least 48 hours prior to first CHG shower. It is OK to shave your face.  Please follow these instructions carefully.   1. Shower the NIGHT BEFORE SURGERY and the MORNING OF SURGERY with CHG.   2. If you chose to wash your hair, wash your hair first as usual with your normal shampoo.  3. After you shampoo, rinse your hair and body thoroughly to remove the shampoo.  4. Use CHG as you would any other liquid soap. You can apply CHG directly to the skin and wash gently with a scrungie or a clean washcloth.   5. Apply the CHG Soap to your body ONLY FROM THE NECK DOWN.  Do not use on open wounds or open sores. Avoid contact with your eyes, ears, mouth and genitals (private parts). Wash Face and genitals (private parts)  with your normal soap.  6. Wash thoroughly, paying special attention to the area where your surgery will be performed.  7. Thoroughly rinse your body with warm water from the neck down.  8. DO NOT shower/wash with your normal soap after using  and rinsing off the CHG Soap.  9. Pat yourself dry with a CLEAN TOWEL.  10. Wear CLEAN PAJAMAS to bed the night before surgery, wear comfortable clothes the morning of surgery  11. Place CLEAN SHEETS on your bed the night of your first shower and DO NOT SLEEP WITH PETS.    Day of Surgery: Shower as stated above. Do not apply any deodorants/lotions.  Please wear clean clothes to the hospital/surgery center.   Remember to brush your teeth WITH YOUR REGULAR TOOTHPASTE.    Please read over the following fact sheets that you were  given.

## 2018-08-14 ENCOUNTER — Other Ambulatory Visit: Payer: Self-pay

## 2018-08-14 ENCOUNTER — Encounter (HOSPITAL_COMMUNITY): Payer: Self-pay

## 2018-08-14 ENCOUNTER — Encounter (HOSPITAL_COMMUNITY)
Admission: RE | Admit: 2018-08-14 | Discharge: 2018-08-14 | Disposition: A | Discharge status: Home / Self Care | Payer: Medicare HMO | Source: Ambulatory Visit | Attending: Otolaryngology | Admitting: Otolaryngology

## 2018-08-14 DIAGNOSIS — Z01812 Encounter for preprocedural laboratory examination: Secondary | ICD-10-CM | POA: Insufficient documentation

## 2018-08-14 DIAGNOSIS — I10 Essential (primary) hypertension: Secondary | ICD-10-CM | POA: Insufficient documentation

## 2018-08-14 DIAGNOSIS — Z87891 Personal history of nicotine dependence: Secondary | ICD-10-CM | POA: Insufficient documentation

## 2018-08-14 DIAGNOSIS — J449 Chronic obstructive pulmonary disease, unspecified: Secondary | ICD-10-CM | POA: Insufficient documentation

## 2018-08-14 DIAGNOSIS — R296 Repeated falls: Secondary | ICD-10-CM | POA: Diagnosis not present

## 2018-08-14 DIAGNOSIS — R2681 Unsteadiness on feet: Secondary | ICD-10-CM | POA: Diagnosis not present

## 2018-08-14 DIAGNOSIS — K119 Disease of salivary gland, unspecified: Secondary | ICD-10-CM | POA: Diagnosis not present

## 2018-08-14 DIAGNOSIS — Z79899 Other long term (current) drug therapy: Secondary | ICD-10-CM | POA: Diagnosis not present

## 2018-08-14 DIAGNOSIS — R2689 Other abnormalities of gait and mobility: Secondary | ICD-10-CM | POA: Diagnosis not present

## 2018-08-14 DIAGNOSIS — K219 Gastro-esophageal reflux disease without esophagitis: Secondary | ICD-10-CM | POA: Diagnosis not present

## 2018-08-14 DIAGNOSIS — E871 Hypo-osmolality and hyponatremia: Secondary | ICD-10-CM | POA: Diagnosis not present

## 2018-08-14 DIAGNOSIS — R278 Other lack of coordination: Secondary | ICD-10-CM | POA: Diagnosis not present

## 2018-08-14 DIAGNOSIS — M6281 Muscle weakness (generalized): Secondary | ICD-10-CM | POA: Diagnosis not present

## 2018-08-14 HISTORY — DX: Angina pectoris, unspecified: I20.9

## 2018-08-14 HISTORY — DX: Unspecified convulsions: R56.9

## 2018-08-14 HISTORY — DX: Chronic obstructive pulmonary disease, unspecified: J44.9

## 2018-08-14 HISTORY — DX: Presence of cardiac pacemaker: Z95.0

## 2018-08-14 HISTORY — DX: Occlusion and stenosis of right carotid artery: I65.21

## 2018-08-14 LAB — BASIC METABOLIC PANEL
Anion gap: 8 (ref 5–15)
BUN: 12 mg/dL (ref 8–23)
CO2: 25 mmol/L (ref 22–32)
Calcium: 9.5 mg/dL (ref 8.9–10.3)
Chloride: 102 mmol/L (ref 98–111)
Creatinine, Ser: 0.99 mg/dL (ref 0.61–1.24)
GFR calc Af Amer: >60 mL/min (ref >60)
GFR calc non Af Amer: >60 mL/min (ref >60)
Glucose, Bld: 105 mg/dL — ABNORMAL HIGH (ref 70–99)
Potassium: 4.1 mmol/L (ref 3.5–5.1)
Sodium: 135 mmol/L (ref 135–145)

## 2018-08-14 LAB — CBC
HCT: 34.4 % — ABNORMAL LOW (ref 39.0–52.0)
Hemoglobin: 11.1 g/dL — ABNORMAL LOW (ref 13.0–17.0)
MCH: 28.8 pg (ref 26.0–34.0)
MCHC: 32.3 g/dL (ref 30.0–36.0)
MCV: 89.4 fL (ref 80.0–100.0)
Platelets: 351 10*3/uL (ref 150–400)
RBC: 3.85 MIL/uL — ABNORMAL LOW (ref 4.22–5.81)
RDW: 15.3 % (ref 11.5–15.5)
WBC: 8.7 10*3/uL (ref 4.0–10.5)
nRBC: 0 % (ref 0.0–0.2)

## 2018-08-14 NOTE — Progress Notes (Signed)
PCP - Dena Billet Cardiologist - Dr. Curt Bears; patient has medtronic PPM  Chest x-ray - 04/03/2018 EKG - 12/01/2017 Stress Test - 07/01/2015 ECHO -  Cardiac Cath - 07/07/2018  Sleep Study - patient denies  Anesthesia review: n/a  Patient denies shortness of breath, fever, cough and chest pain at PAT appointment   Patient verbalized understanding of instructions that were given to them at the PAT appointment. Patient was also instructed that they will need to review over the PAT instructions again at home before surgery.

## 2018-08-15 DIAGNOSIS — M6281 Muscle weakness (generalized): Secondary | ICD-10-CM | POA: Diagnosis not present

## 2018-08-15 DIAGNOSIS — R296 Repeated falls: Secondary | ICD-10-CM | POA: Diagnosis not present

## 2018-08-15 DIAGNOSIS — R2681 Unsteadiness on feet: Secondary | ICD-10-CM | POA: Diagnosis not present

## 2018-08-15 DIAGNOSIS — R2689 Other abnormalities of gait and mobility: Secondary | ICD-10-CM | POA: Diagnosis not present

## 2018-08-15 DIAGNOSIS — E871 Hypo-osmolality and hyponatremia: Secondary | ICD-10-CM | POA: Diagnosis not present

## 2018-08-15 DIAGNOSIS — R278 Other lack of coordination: Secondary | ICD-10-CM | POA: Diagnosis not present

## 2018-08-16 DIAGNOSIS — R296 Repeated falls: Secondary | ICD-10-CM | POA: Diagnosis not present

## 2018-08-16 DIAGNOSIS — E871 Hypo-osmolality and hyponatremia: Secondary | ICD-10-CM | POA: Diagnosis not present

## 2018-08-16 DIAGNOSIS — R278 Other lack of coordination: Secondary | ICD-10-CM | POA: Diagnosis not present

## 2018-08-16 DIAGNOSIS — R2689 Other abnormalities of gait and mobility: Secondary | ICD-10-CM | POA: Diagnosis not present

## 2018-08-16 DIAGNOSIS — R2681 Unsteadiness on feet: Secondary | ICD-10-CM | POA: Diagnosis not present

## 2018-08-16 DIAGNOSIS — M6281 Muscle weakness (generalized): Secondary | ICD-10-CM | POA: Diagnosis not present

## 2018-08-16 SURGERY — Surgical Case
Anesthesia: *Unknown

## 2018-08-17 DIAGNOSIS — E871 Hypo-osmolality and hyponatremia: Secondary | ICD-10-CM | POA: Diagnosis not present

## 2018-08-18 DIAGNOSIS — E871 Hypo-osmolality and hyponatremia: Secondary | ICD-10-CM | POA: Diagnosis not present

## 2018-08-19 ENCOUNTER — Encounter (HOSPITAL_COMMUNITY): Payer: Self-pay

## 2018-08-19 DIAGNOSIS — M6281 Muscle weakness (generalized): Secondary | ICD-10-CM | POA: Diagnosis not present

## 2018-08-19 DIAGNOSIS — R296 Repeated falls: Secondary | ICD-10-CM | POA: Diagnosis not present

## 2018-08-19 DIAGNOSIS — R278 Other lack of coordination: Secondary | ICD-10-CM | POA: Diagnosis not present

## 2018-08-19 DIAGNOSIS — R2681 Unsteadiness on feet: Secondary | ICD-10-CM | POA: Diagnosis not present

## 2018-08-19 DIAGNOSIS — E871 Hypo-osmolality and hyponatremia: Secondary | ICD-10-CM | POA: Diagnosis not present

## 2018-08-19 DIAGNOSIS — R2689 Other abnormalities of gait and mobility: Secondary | ICD-10-CM | POA: Diagnosis not present

## 2018-08-19 NOTE — Progress Notes (Signed)
Anesthesia Chart Review:  Case:  [id number] Date/Time:  08/21/18 1015   Procedure:  LEFT SUPERFICIAL PAROTIDECTOMY (Left )   Anesthesia type:  General   Pre-op diagnosis:  LEFT PAROTID MASS   Location:  [facility] OR ROOM 08 / [facility]   Surgeon:  Helayne Seminole, MD      DISCUSSION: Patient is a 72 year old male scheduled for the above procedure.   History includes smoking, HTN, murmur, symptomatic bradycardia (s/p dual chamber Medtronic PPM 07/09/15), chest pain (normal coronaries 07/08/15), COPD, seizures (first 06/29/15 in setting of possible alcohol withdrawal followed by 2nd/3rd breakthrough seziures in setting of asystole/complete heart block requiring CPR, VT after epi requiring shock, s/p temporary pacemaker, and ultimately PPM 07/09/15), right ICA occlusion, alcohol abuse (admission for severe hyponatremia 11/2017). Records indicate that he is a resident at [facility]).  Patient last seen by EP 02/2018 with normal pacer function. Pacemaker is located on left chest. Per perioperative PPM device form, patient is pacer dependent and management over device during procedure recommended. Contact Medtronic rep if needed.  Anesthesiologist to evaluate on the day of surgery.    VS: BP (!) 132/54   Pulse 66   Temp 36.8 C (Oral)   Resp 18   Ht 5' 9.5" (1.765 m)   Wt 89.7 kg   SpO2 96%   BMI 28.78 kg/m    PROVIDERS: Orlena Sheldon, PA-C is PCP - Curt Bears, Will, MD is EP cardiologist. Last visit 02/05/18. Medtronic PPM functioning appropriately at that time. Metoprolol increased due to episodes of high ventricular rates. Six month follow-up recommended. Adele Barthel, MD is vascular surgeon. Last visit 03/20/18 for right ICA occlusion and < 80% LICA stenosis. Patient reported transient expressive aphasia at [facility] place 07/2015, although I don't see this documented in the discharge summary. No recent neurologic symptoms. He offered patient annual carotid Duplex. Ellouise Newer, MD is neurologist.  Last visit 08/09/16. Patient had been on Keppra since seizures felt related to ETOH redrawal and then due to complete heart block. EEG normal (last 07/17/16). She agreed with tapering off Keppra--although it appears he is still on. She recommended alcohol cessation and one year follow-up. Last notation from 01/23/18 indicates PCP could refill his prescriptions, otherwise he could make a follow-up appointment with her.    LABS: Labs reviewed: Acceptable for surgery. Last LFTs seen 12/01/17 showed AST 61, ALT 27.  (all labs ordered are listed, but only abnormal results are displayed)  Labs Reviewed  BASIC METABOLIC PANEL - Abnormal; Notable for the following components:      Result Value   Glucose, Bld 105 (*)    All other components within normal limits  CBC - Abnormal; Notable for the following components:   RBC 3.85 (*)    Hemoglobin 11.1 (*)    HCT 34.4 (*)    All other components within normal limits     IMAGES: CT soft tissue neck 07/25/18: IMPRESSION: Interval growth of LEFT parotid mass, peripherally enhancing, now 19 x 24 x 22 mm. 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  CXR 04/03/18: IMPRESSION: COPD without acute cardiopulmonary abnormality.   EKG: 11/30/17: Atrial sensed ventricular paced rhythm.    CV: Carotid U/S 11/23/17 (as outlined in 03/20/18 note by Adele Barthel, MD):  B Carotid Duplex (11/23/17):   R ICA stenosis:  occluded  R VA: patent and antegrade  L ICA stenosis:  40-59%  L VA: patent and antegrade  Cardiac cath 07/08/15: -  Complete heart block with ventricular rates in the low 30s requiring insertion of a transvenous pacemaker. - Normal coronary arteries. - Hyperdynamic LV function with an ejection fraction of a aproximately 65%.    Echo 07/01/15: Study Conclusions - Left ventricle: The cavity size was normal. Wall thickness was   increased in a pattern of moderate LVH. Systolic function was   normal. The estimated  ejection fraction was in the range of 55%   to 60%. Doppler parameters are consistent with abnormal left   ventricular relaxation (grade 1 diastolic dysfunction). - Aortic valve: There was mild regurgitation. - Mitral valve: Moderately calcified annulus. There was mild   regurgitation. - Left atrium: The atrium was moderately to severely dilated. (Comparison echo 12/03/13, read by Loralie Champagne, MD: Normal LV size with moderate focal basal septalhypertrophy. EF 60-65%. There did appear to be an LV outflow tract gradient peak 26 mmHg at rest increasing to68 mmHg with valsalva. The aortic valve was calcified butappeared to open well. I do not think that it is significantly stenosed. No mitral valve SAM. Normal RV size and systolic function.)  Past Medical History:  Diagnosis Date  . Anemia   . Anginal pain ([place])    normal coronaries 2016  . Celiac disease   . COPD (chronic obstructive pulmonary disease) ([place])   . GERD (gastroesophageal reflux disease)   . Heart murmur   . Hypertension   . Multiple duodenal ulcers   . Presence of permanent cardiac pacemaker   . Right internal carotid occlusion   . Seizures ([place])   . Shortness of breath   . Symptomatic Bradycardia    a. 07/2015 s/p MDT Advisa L DC PPM (Ser #: [id number]).  . 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 CARDIAC CATHETERIZATION N/A 07/08/2015   Procedure: Left Heart Cath and Coronary Angiography;  Surgeon: Troy Sine, MD;  Location: [facility];  Service: Cardiovascular;  Laterality: N/A;  . CARDIAC CATHETERIZATION N/A 07/08/2015   Procedure: Temporary Pacemaker;  Surgeon: Troy Sine, MD;  Location: [facility];  Service: Cardiovascular;  Laterality: N/A;  . CARDIAC CATHETERIZATION N/A 07/09/2015   Procedure: Temporary Pacemaker;  Surgeon: Troy Sine, MD;  Location: [facility];  Service: Cardiovascular;   Laterality: N/A;  . EP IMPLANTABLE DEVICE N/A 07/09/2015   Procedure: Pacemaker Implant;  Surgeon: Will Meredith Leeds, MD;  Location: [facility];  Service: Cardiovascular;  Laterality: N/A;  . ESOPHAGOGASTRODUODENOSCOPY  07/03/2012   Procedure: ESOPHAGOGASTRODUODENOSCOPY (EGD);  Surgeon: Winfield Cunas., MD;  Location: [facility] ENDOSCOPY;  Service: Endoscopy;  Laterality: N/A;  . HEMORRHOID SURGERY    . HERNIA REPAIR    . MOLE REMOVAL      MEDICATIONS: . allopurinol (ZYLOPRIM) 300 MG tablet  . amLODipine (NORVASC) 5 MG tablet  . atorvastatin (LIPITOR) 20 MG tablet  . clotrimazole-betamethasone (LOTRISONE) cream  . colchicine 0.6 MG tablet  . docusate sodium (COLACE) 100 MG capsule  . ferrous sulfate 325 (65 FE) MG tablet  . fluticasone (FLONASE) 50 MCG/ACT nasal spray  . folic acid (FOLVITE) 1 MG tablet  . furosemide (LASIX) 20 MG tablet  . guaiFENesin (MUCINEX) 600 MG 12 hr tablet  . guaiFENesin (ROBITUSSIN) 100 MG/5ML liquid  . levETIRAcetam (KEPPRA XR) 500 MG 24 hr tablet  . levETIRAcetam (KEPPRA) 500 MG tablet  . lisinopril (PRINIVIL,ZESTRIL) 20 MG tablet  . metoprolol tartrate (LOPRESSOR) 50 MG  tablet  . Multiple Vitamin (MULTIVITAMIN WITH MINERALS) TABS tablet  . naproxen sodium (ALEVE) 220 MG tablet  . pantoprazole (PROTONIX) 40 MG tablet  . polyethylene glycol (MIRALAX / GLYCOLAX) packet  . potassium chloride (K-DUR) 10 MEQ tablet  . tamsulosin (FLOMAX) 0.4 MG CAPS capsule  . thiamine 100 MG tablet   No current facility-administered medications for this encounter.   Not taking amlodipine, Lotrisone cream.   Myra Gianotti, PA-C [facility] Short Stay Center/Anesthesiology Phone [phone] 08/19/2018 3:25 PM

## 2018-08-20 DIAGNOSIS — R296 Repeated falls: Secondary | ICD-10-CM | POA: Diagnosis not present

## 2018-08-20 DIAGNOSIS — R2681 Unsteadiness on feet: Secondary | ICD-10-CM | POA: Diagnosis not present

## 2018-08-20 DIAGNOSIS — E871 Hypo-osmolality and hyponatremia: Secondary | ICD-10-CM | POA: Diagnosis not present

## 2018-08-20 DIAGNOSIS — R278 Other lack of coordination: Secondary | ICD-10-CM | POA: Diagnosis not present

## 2018-08-20 DIAGNOSIS — M6281 Muscle weakness (generalized): Secondary | ICD-10-CM | POA: Diagnosis not present

## 2018-08-20 DIAGNOSIS — R2689 Other abnormalities of gait and mobility: Secondary | ICD-10-CM | POA: Diagnosis not present

## 2018-08-21 ENCOUNTER — Ambulatory Visit (HOSPITAL_COMMUNITY)
Admission: RE | Admit: 2018-08-21 | Discharge: 2018-08-23 | Payer: Medicare HMO | Source: Ambulatory Visit | Attending: Otolaryngology | Admitting: Otolaryngology

## 2018-08-21 ENCOUNTER — Ambulatory Visit (HOSPITAL_COMMUNITY): Payer: Medicare HMO | Admitting: Vascular Surgery

## 2018-08-21 ENCOUNTER — Encounter (HOSPITAL_COMMUNITY)
Admission: RE | Disposition: A | Discharge status: Other Acute Inpatient Hospital | Payer: Self-pay | Source: Ambulatory Visit | Attending: Otolaryngology

## 2018-08-21 ENCOUNTER — Other Ambulatory Visit: Payer: Self-pay

## 2018-08-21 ENCOUNTER — Encounter (HOSPITAL_COMMUNITY): Payer: Self-pay

## 2018-08-21 ENCOUNTER — Ambulatory Visit (HOSPITAL_COMMUNITY): Payer: Medicare HMO | Admitting: Anesthesiology

## 2018-08-21 DIAGNOSIS — K219 Gastro-esophageal reflux disease without esophagitis: Secondary | ICD-10-CM | POA: Insufficient documentation

## 2018-08-21 DIAGNOSIS — I739 Peripheral vascular disease, unspecified: Secondary | ICD-10-CM | POA: Diagnosis not present

## 2018-08-21 DIAGNOSIS — Z8673 Personal history of transient ischemic attack (TIA), and cerebral infarction without residual deficits: Secondary | ICD-10-CM | POA: Diagnosis not present

## 2018-08-21 DIAGNOSIS — F17210 Nicotine dependence, cigarettes, uncomplicated: Secondary | ICD-10-CM | POA: Insufficient documentation

## 2018-08-21 DIAGNOSIS — K118 Other diseases of salivary glands: Secondary | ICD-10-CM | POA: Diagnosis not present

## 2018-08-21 DIAGNOSIS — Z791 Long term (current) use of non-steroidal anti-inflammatories (NSAID): Secondary | ICD-10-CM | POA: Diagnosis not present

## 2018-08-21 DIAGNOSIS — Z950 Presence of cardiac pacemaker: Secondary | ICD-10-CM | POA: Diagnosis not present

## 2018-08-21 DIAGNOSIS — N400 Enlarged prostate without lower urinary tract symptoms: Secondary | ICD-10-CM | POA: Diagnosis not present

## 2018-08-21 DIAGNOSIS — J449 Chronic obstructive pulmonary disease, unspecified: Secondary | ICD-10-CM | POA: Diagnosis not present

## 2018-08-21 DIAGNOSIS — Z79899 Other long term (current) drug therapy: Secondary | ICD-10-CM | POA: Insufficient documentation

## 2018-08-21 DIAGNOSIS — M109 Gout, unspecified: Secondary | ICD-10-CM | POA: Diagnosis not present

## 2018-08-21 DIAGNOSIS — C4A9 Merkel cell carcinoma, unspecified: Secondary | ICD-10-CM | POA: Insufficient documentation

## 2018-08-21 DIAGNOSIS — I5032 Chronic diastolic (congestive) heart failure: Secondary | ICD-10-CM | POA: Insufficient documentation

## 2018-08-21 DIAGNOSIS — C07 Malignant neoplasm of parotid gland: Secondary | ICD-10-CM | POA: Diagnosis not present

## 2018-08-21 DIAGNOSIS — I110 Hypertensive heart disease with heart failure: Secondary | ICD-10-CM | POA: Diagnosis not present

## 2018-08-21 DIAGNOSIS — K119 Disease of salivary gland, unspecified: Secondary | ICD-10-CM | POA: Diagnosis not present

## 2018-08-21 DIAGNOSIS — R569 Unspecified convulsions: Secondary | ICD-10-CM | POA: Diagnosis not present

## 2018-08-21 DIAGNOSIS — I10 Essential (primary) hypertension: Secondary | ICD-10-CM | POA: Diagnosis present

## 2018-08-21 DIAGNOSIS — E785 Hyperlipidemia, unspecified: Secondary | ICD-10-CM | POA: Diagnosis not present

## 2018-08-21 HISTORY — PX: PAROTIDECTOMY: SHX2163

## 2018-08-21 HISTORY — PX: PAROTIDECTOMY: SUR1003

## 2018-08-21 SURGERY — EXCISION, PAROTID GLAND
Anesthesia: General | Site: Neck | Laterality: Left

## 2018-08-21 MED ORDER — 0.9 % SODIUM CHLORIDE (POUR BTL) OPTIME
TOPICAL | Status: DC | PRN
  Administered 2018-08-21: 1000 mL

## 2018-08-21 MED ORDER — PANTOPRAZOLE SODIUM 40 MG PO TBEC
40.0000 mg | DELAYED_RELEASE_TABLET | Freq: Two times a day (BID) | ORAL | Status: DC
  Administered 2018-08-21 – 2018-08-23 (×4): 40 mg via ORAL
  Filled 2018-08-21 (×4): qty 1

## 2018-08-21 MED ORDER — LIDOCAINE 2% (20 MG/ML) 5 ML SYRINGE
INTRAMUSCULAR | Status: AC
  Filled 2018-08-21: qty 5

## 2018-08-21 MED ORDER — TAMSULOSIN HCL 0.4 MG PO CAPS
0.4000 mg | ORAL_CAPSULE | Freq: Every day | ORAL | Status: DC
  Administered 2018-08-22 – 2018-08-23 (×2): 0.4 mg via ORAL
  Filled 2018-08-21 (×2): qty 1

## 2018-08-21 MED ORDER — FENTANYL CITRATE (PF) 100 MCG/2ML IJ SOLN
INTRAMUSCULAR | Status: AC
  Filled 2018-08-21: qty 2

## 2018-08-21 MED ORDER — ONDANSETRON HCL 4 MG/2ML IJ SOLN
INTRAMUSCULAR | Status: AC
  Filled 2018-08-21: qty 4

## 2018-08-21 MED ORDER — LACTATED RINGERS IV SOLN
INTRAVENOUS | Status: DC
  Administered 2018-08-21 – 2018-08-22 (×2): via INTRAVENOUS

## 2018-08-21 MED ORDER — ONDANSETRON HCL 4 MG/2ML IJ SOLN
INTRAMUSCULAR | Status: DC | PRN
  Administered 2018-08-21: 4 mg via INTRAVENOUS

## 2018-08-21 MED ORDER — ATORVASTATIN CALCIUM 20 MG PO TABS
20.0000 mg | ORAL_TABLET | Freq: Every day | ORAL | Status: DC
  Administered 2018-08-22: 20 mg via ORAL
  Filled 2018-08-21: qty 1

## 2018-08-21 MED ORDER — POLYETHYLENE GLYCOL 3350 17 G PO PACK
17.0000 g | PACK | Freq: Two times a day (BID) | ORAL | Status: DC
  Administered 2018-08-22 – 2018-08-23 (×2): 17 g via ORAL
  Filled 2018-08-21 (×3): qty 1

## 2018-08-21 MED ORDER — FUROSEMIDE 20 MG PO TABS
20.0000 mg | ORAL_TABLET | Freq: Every day | ORAL | Status: DC
  Administered 2018-08-21 – 2018-08-23 (×3): 20 mg via ORAL
  Filled 2018-08-21 (×3): qty 1

## 2018-08-21 MED ORDER — HEMOSTATIC AGENTS (NO CHARGE) OPTIME
TOPICAL | Status: DC | PRN
  Administered 2018-08-21: 1 via TOPICAL

## 2018-08-21 MED ORDER — DEXMEDETOMIDINE HCL 200 MCG/2ML IV SOLN
INTRAVENOUS | Status: DC | PRN
  Administered 2018-08-21 (×5): 4 ug via INTRAVENOUS

## 2018-08-21 MED ORDER — HYDROCODONE-ACETAMINOPHEN 5-325 MG PO TABS
1.0000 | ORAL_TABLET | ORAL | Status: DC | PRN
  Administered 2018-08-22: 2 via ORAL
  Filled 2018-08-21: qty 2

## 2018-08-21 MED ORDER — LIDOCAINE 2% (20 MG/ML) 5 ML SYRINGE
INTRAMUSCULAR | Status: DC | PRN
  Administered 2018-08-21 (×2): 50 mg via INTRAVENOUS
  Administered 2018-08-21: 80 mg via INTRAVENOUS

## 2018-08-21 MED ORDER — FENTANYL CITRATE (PF) 100 MCG/2ML IJ SOLN
INTRAMUSCULAR | Status: DC | PRN
  Administered 2018-08-21: 100 ug via INTRAVENOUS
  Administered 2018-08-21 (×4): 25 ug via INTRAVENOUS

## 2018-08-21 MED ORDER — FOLIC ACID 1 MG PO TABS
1.0000 mg | ORAL_TABLET | Freq: Every day | ORAL | Status: DC
  Administered 2018-08-21 – 2018-08-23 (×3): 1 mg via ORAL
  Filled 2018-08-21 (×3): qty 1

## 2018-08-21 MED ORDER — LIDOCAINE-EPINEPHRINE 1 %-1:100000 IJ SOLN
INTRAMUSCULAR | Status: DC | PRN
  Administered 2018-08-21: 7 mL

## 2018-08-21 MED ORDER — EPHEDRINE SULFATE-NACL 50-0.9 MG/10ML-% IV SOSY
PREFILLED_SYRINGE | INTRAVENOUS | Status: DC | PRN
  Administered 2018-08-21 (×2): 5 mg via INTRAVENOUS

## 2018-08-21 MED ORDER — METOPROLOL TARTRATE 50 MG PO TABS
75.0000 mg | ORAL_TABLET | Freq: Two times a day (BID) | ORAL | Status: DC
  Administered 2018-08-21 – 2018-08-23 (×4): 75 mg via ORAL
  Filled 2018-08-21 (×4): qty 1

## 2018-08-21 MED ORDER — LISINOPRIL 20 MG PO TABS
20.0000 mg | ORAL_TABLET | Freq: Every day | ORAL | Status: DC
  Administered 2018-08-21 – 2018-08-23 (×3): 20 mg via ORAL
  Filled 2018-08-21 (×3): qty 1

## 2018-08-21 MED ORDER — DOCUSATE SODIUM 100 MG PO CAPS
100.0000 mg | ORAL_CAPSULE | Freq: Every day | ORAL | Status: DC
  Administered 2018-08-21 – 2018-08-23 (×3): 100 mg via ORAL
  Filled 2018-08-21 (×3): qty 1

## 2018-08-21 MED ORDER — BACITRACIN-NEOMYCIN-POLYMYXIN 400-5-5000 EX OINT
TOPICAL_OINTMENT | CUTANEOUS | Status: AC
  Filled 2018-08-21: qty 1

## 2018-08-21 MED ORDER — LACTATED RINGERS IV SOLN
INTRAVENOUS | Status: DC | PRN
  Administered 2018-08-21 (×2): via INTRAVENOUS

## 2018-08-21 MED ORDER — PROPOFOL 10 MG/ML IV BOLUS
INTRAVENOUS | Status: DC | PRN
  Administered 2018-08-21: 10 mg via INTRAVENOUS
  Administered 2018-08-21: 150 mg via INTRAVENOUS
  Administered 2018-08-21 (×2): 10 mg via INTRAVENOUS

## 2018-08-21 MED ORDER — MORPHINE SULFATE (PF) 2 MG/ML IV SOLN: 2.0000 mg | INTRAVENOUS | Status: DC | PRN

## 2018-08-21 MED ORDER — FERROUS SULFATE 325 (65 FE) MG PO TABS
325.0000 mg | ORAL_TABLET | Freq: Every day | ORAL | Status: DC
  Administered 2018-08-21 – 2018-08-23 (×2): 325 mg via ORAL
  Filled 2018-08-21 (×3): qty 1

## 2018-08-21 MED ORDER — ONDANSETRON HCL 4 MG/2ML IJ SOLN: 4.0000 mg | INTRAMUSCULAR | Status: DC | PRN

## 2018-08-21 MED ORDER — SUCCINYLCHOLINE CHLORIDE 20 MG/ML IJ SOLN
INTRAMUSCULAR | Status: DC | PRN
  Administered 2018-08-21: 160 mg via INTRAVENOUS

## 2018-08-21 MED ORDER — PHENYLEPHRINE 40 MCG/ML (10ML) SYRINGE FOR IV PUSH (FOR BLOOD PRESSURE SUPPORT)
PREFILLED_SYRINGE | INTRAVENOUS | Status: DC | PRN
  Administered 2018-08-21: 80 ug via INTRAVENOUS
  Administered 2018-08-21: 120 ug via INTRAVENOUS
  Administered 2018-08-21: 40 ug via INTRAVENOUS
  Administered 2018-08-21 (×2): 80 ug via INTRAVENOUS

## 2018-08-21 MED ORDER — FENTANYL CITRATE (PF) 250 MCG/5ML IJ SOLN
INTRAMUSCULAR | Status: AC
  Filled 2018-08-21: qty 5

## 2018-08-21 MED ORDER — HYDROCODONE-ACETAMINOPHEN 5-325 MG PO TABS: 1.0000 | ORAL_TABLET | ORAL | 0 refills | Status: AC | PRN

## 2018-08-21 MED ORDER — SODIUM CHLORIDE 0.9 % IV SOLN
INTRAVENOUS | Status: DC | PRN
  Administered 2018-08-21: 40 ug/min via INTRAVENOUS

## 2018-08-21 MED ORDER — FENTANYL CITRATE (PF) 100 MCG/2ML IJ SOLN
25.0000 ug | INTRAMUSCULAR | Status: DC | PRN
  Administered 2018-08-21: 25 ug via INTRAVENOUS

## 2018-08-21 MED ORDER — ACETAMINOPHEN 650 MG RE SUPP: 650.0000 mg | RECTAL | Status: DC | PRN

## 2018-08-21 MED ORDER — LEVETIRACETAM 750 MG PO TABS
1500.0000 mg | ORAL_TABLET | Freq: Every day | ORAL | Status: DC
  Administered 2018-08-22 – 2018-08-23 (×2): 1500 mg via ORAL
  Filled 2018-08-21 (×2): qty 2

## 2018-08-21 MED ORDER — LIDOCAINE-EPINEPHRINE 1 %-1:100000 IJ SOLN
INTRAMUSCULAR | Status: AC
  Filled 2018-08-21: qty 1

## 2018-08-21 MED ORDER — SCOPOLAMINE 1 MG/3DAYS TD PT72
MEDICATED_PATCH | TRANSDERMAL | Status: AC
  Filled 2018-08-21: qty 2

## 2018-08-21 MED ORDER — ALLOPURINOL 300 MG PO TABS
300.0000 mg | ORAL_TABLET | Freq: Every day | ORAL | Status: DC
  Administered 2018-08-22 – 2018-08-23 (×2): 300 mg via ORAL
  Filled 2018-08-21 (×2): qty 1

## 2018-08-21 MED ORDER — ONDANSETRON HCL 4 MG PO TABS: 4.0000 mg | ORAL_TABLET | ORAL | Status: DC | PRN

## 2018-08-21 MED ORDER — ESMOLOL HCL 100 MG/10ML IV SOLN
INTRAVENOUS | Status: AC
  Filled 2018-08-21: qty 10

## 2018-08-21 MED ORDER — VITAMIN B-1 100 MG PO TABS
100.0000 mg | ORAL_TABLET | Freq: Every day | ORAL | Status: DC
  Administered 2018-08-21 – 2018-08-23 (×3): 100 mg via ORAL
  Filled 2018-08-21 (×3): qty 1

## 2018-08-21 MED ORDER — DEXAMETHASONE SODIUM PHOSPHATE 10 MG/ML IJ SOLN
INTRAMUSCULAR | Status: DC | PRN
  Administered 2018-08-21: 10 mg via INTRAVENOUS

## 2018-08-21 MED ORDER — POTASSIUM CHLORIDE CRYS ER 10 MEQ PO TBCR
10.0000 meq | EXTENDED_RELEASE_TABLET | Freq: Every day | ORAL | Status: DC
  Administered 2018-08-21 – 2018-08-23 (×3): 10 meq via ORAL
  Filled 2018-08-21 (×3): qty 1

## 2018-08-21 MED ORDER — PROPOFOL 10 MG/ML IV BOLUS
INTRAVENOUS | Status: AC
  Filled 2018-08-21: qty 20

## 2018-08-21 MED ORDER — ACETAMINOPHEN 160 MG/5ML PO SOLN
650.0000 mg | ORAL | Status: DC | PRN
  Administered 2018-08-22 – 2018-08-23 (×4): 650 mg via ORAL
  Filled 2018-08-21 (×4): qty 20.3

## 2018-08-21 MED ORDER — CEFAZOLIN SODIUM-DEXTROSE 2-4 GM/100ML-% IV SOLN
2.0000 g | INTRAVENOUS | Status: AC
  Administered 2018-08-21: 2 g via INTRAVENOUS
  Filled 2018-08-21: qty 100

## 2018-08-21 MED ORDER — ROCURONIUM BROMIDE 50 MG/5ML IV SOSY
PREFILLED_SYRINGE | INTRAVENOUS | Status: AC
  Filled 2018-08-21: qty 5

## 2018-08-21 MED ORDER — DEXAMETHASONE SODIUM PHOSPHATE 10 MG/ML IJ SOLN
INTRAMUSCULAR | Status: AC
  Filled 2018-08-21: qty 1

## 2018-08-21 MED ORDER — ENOXAPARIN SODIUM 40 MG/0.4ML ~~LOC~~ SOLN
40.0000 mg | SUBCUTANEOUS | Status: DC
  Filled 2018-08-21 (×2): qty 0.4

## 2018-08-21 MED ORDER — GUAIFENESIN 100 MG/5ML PO LIQD: 200.0000 mg | ORAL | Status: DC | PRN

## 2018-08-21 SURGICAL SUPPLY — 59 items
ADH SKN CLS APL DERMABOND .7 (GAUZE/BANDAGES/DRESSINGS) ×1
BLADE SURG 15 STRL LF DISP TIS (BLADE) IMPLANT
BLADE SURG 15 STRL SS (BLADE)
BRUSH BIPOLAR W/O CORD 20G (MISCELLANEOUS) IMPLANT
CANISTER SUCT 3000ML PPV (MISCELLANEOUS) ×2 IMPLANT
CLIP LIGATING EXTRA MED SLVR (CLIP) ×1 IMPLANT
CLIP LIGATING EXTRA SM BLUE (MISCELLANEOUS) ×2 IMPLANT
CONT SPEC 4OZ CLIKSEAL STRL BL (MISCELLANEOUS) IMPLANT
CORD BIPOLAR FORCEPS 12FT (ELECTRODE) ×2 IMPLANT
COVER SURGICAL LIGHT HANDLE (MISCELLANEOUS) ×2 IMPLANT
COVER WAND RF STERILE (DRAPES) ×1 IMPLANT
CRADLE DONUT ADULT HEAD (MISCELLANEOUS) ×2 IMPLANT
DERMABOND ADVANCED (GAUZE/BANDAGES/DRESSINGS) ×1
DERMABOND ADVANCED .7 DNX12 (GAUZE/BANDAGES/DRESSINGS) ×1 IMPLANT
DRAIN JACKSON RD 7FR 3/32 (WOUND CARE) IMPLANT
DRAIN SNY 10 ROU (WOUND CARE) IMPLANT
DRAIN TLS ROUND 10FR (DRAIN) ×1 IMPLANT
DRAPE HALF SHEET 40X57 (DRAPES) IMPLANT
DRAPE INCISE 23X17 IOBAN STRL (DRAPES) ×1
DRAPE INCISE 23X17 STRL (DRAPES) IMPLANT
DRAPE INCISE IOBAN 23X17 STRL (DRAPES) ×1 IMPLANT
ELECT COATED BLADE 2.86 ST (ELECTRODE) ×2 IMPLANT
ELECT PAIRED SUBDERMAL (MISCELLANEOUS) ×2
ELECT REM PT RETURN 9FT ADLT (ELECTROSURGICAL) ×2
ELECTRODE PAIRED SUBDERMAL (MISCELLANEOUS) IMPLANT
ELECTRODE REM PT RTRN 9FT ADLT (ELECTROSURGICAL) ×1 IMPLANT
EVACUATOR SILICONE 100CC (DRAIN) ×2 IMPLANT
FORCEPS BIPOLAR SPETZLER 8 1.0 (NEUROSURGERY SUPPLIES) ×2 IMPLANT
GAUZE 4X4 16PLY RFD (DISPOSABLE) ×3 IMPLANT
GLOVE BIO SURGEON STRL SZ 6.5 (GLOVE) ×2 IMPLANT
GLOVE BIOGEL M 6.5 STRL (GLOVE) ×2 IMPLANT
GOWN STRL REUS W/ TWL LRG LVL3 (GOWN DISPOSABLE) ×2 IMPLANT
GOWN STRL REUS W/TWL LRG LVL3 (GOWN DISPOSABLE) ×4
HEMOSTAT ARISTA ABSORB 3G PWDR (MISCELLANEOUS) ×1 IMPLANT
KIT BASIN OR (CUSTOM PROCEDURE TRAY) ×2 IMPLANT
KIT TURNOVER KIT B (KITS) ×2 IMPLANT
LOCATOR NERVE 3 VOLT (DISPOSABLE) IMPLANT
NDL HYPO 25GX1X1/2 BEV (NEEDLE) IMPLANT
NEEDLE HYPO 25GX1X1/2 BEV (NEEDLE) IMPLANT
NS IRRIG 1000ML POUR BTL (IV SOLUTION) ×2 IMPLANT
PAD ARMBOARD 7.5X6 YLW CONV (MISCELLANEOUS) ×4 IMPLANT
PENCIL BUTTON HOLSTER BLD 10FT (ELECTRODE) ×2 IMPLANT
POWDER SURGICEL 3.0 GRAM (HEMOSTASIS) IMPLANT
PROBE NERVBE PRASS .33 (MISCELLANEOUS) ×1 IMPLANT
SHEARS HARMONIC 9CM CVD (BLADE) ×3 IMPLANT
SPONGE INTESTINAL PEANUT (DISPOSABLE) ×2 IMPLANT
STAPLER VISISTAT 35W (STAPLE) ×1 IMPLANT
SUT ETHILON 2 0 FS 18 (SUTURE) ×1 IMPLANT
SUT PLAIN GUT FAST 5-0 (SUTURE) ×1 IMPLANT
SUT PROLENE 5 0 PS 2 (SUTURE) IMPLANT
SUT SILK 3 0 REEL (SUTURE) ×2 IMPLANT
SUT VIC AB 3-0 SH 27 (SUTURE)
SUT VIC AB 3-0 SH 27X BRD (SUTURE) IMPLANT
SUT VIC AB 3-0 SH 8-18 (SUTURE) ×1 IMPLANT
SUT VICRYL 4-0 PS2 18IN ABS (SUTURE) ×4 IMPLANT
TOWEL NATURAL 6PK STERILE (DISPOSABLE) ×2 IMPLANT
TRAY ENT MC OR (CUSTOM PROCEDURE TRAY) ×2 IMPLANT
TUBE ENDOTRAC EMG 7X10.2 (MISCELLANEOUS) IMPLANT
TUBE FEEDING 10FR FLEXIFLO (MISCELLANEOUS) IMPLANT

## 2018-08-21 NOTE — Progress Notes (Signed)
   ENT Progress Note: s/p Procedure(s): LEFT SUPERFICIAL PAROTIDECTOMY   Subjective: Min discomfort  Objective: Vital signs in last 24 hours: Temp:  [97.7 F (36.5 C)-98.1 F (36.7 C)] 98 F (36.7 C) (10/16 1545) Pulse Rate:  [62-71] 66 (10/16 1545) Resp:  [8-18] 18 (10/16 1545) BP: (103-129)/(47-59) 123/55 (10/16 1545) SpO2:  [91 %-98 %] 98 % (10/16 1545) Weight:  [86.2 kg-87.5 kg] (P) 87.5 kg (10/16 1518) Weight change:     Intake/Output from previous day: No intake/output data recorded. Intake/Output this shift: Total I/O In: 1400 [I.V.:1400] Out: 85 [Drains:10; Blood:75]  Labs: No results for input(s): WBC, HGB, HCT, PLT in the last 72 hours. No results for input(s): NA, K, CL, CO2, GLUCOSE, BUN, CALCIUM in the last 72 hours.  Invalid input(s): CREATININR  Studies/Results: No results found.   PHYSICAL EXAM: Inc intact JP functioning, expected output Mild facial weakness   Assessment/Plan: Stable Cont current tx, o/n obs    Jerrell Belfast 08/21/2018, 5:23 PM

## 2018-08-21 NOTE — Plan of Care (Signed)
  Problem: Clinical Measurements: Goal: Ability to maintain clinical measurements within normal limits will improve Outcome: Progressing Goal: Will remain free from infection Outcome: Progressing Goal: Diagnostic test results will improve Outcome: Progressing   Problem: Skin Integrity: Goal: Risk for impaired skin integrity will decrease Outcome: Progressing

## 2018-08-21 NOTE — Anesthesia Procedure Notes (Signed)
Procedure Name: Intubation Performed by: Milford Cage, CRNA Pre-anesthesia Checklist: Patient identified, Emergency Drugs available, Suction available and Patient being monitored Patient Re-evaluated:Patient Re-evaluated prior to induction Oxygen Delivery Method: Circle System Utilized Preoxygenation: Pre-oxygenation with 100% oxygen Induction Type: IV induction Ventilation: Mask ventilation without difficulty and Two handed mask ventilation required Laryngoscope Size: Mac and 4 Grade View: Grade I Tube type: Oral Tube size: 7.5 mm Number of attempts: 1 Airway Equipment and Method: Stylet and LTA kit utilized Placement Confirmation: ETT inserted through vocal cords under direct vision,  positive ETCO2 and breath sounds checked- equal and bilateral Secured at: 23 cm Tube secured with: Tape Dental Injury: Teeth and Oropharynx as per pre-operative assessment

## 2018-08-21 NOTE — Plan of Care (Signed)
  Problem: Nutrition: Goal: Adequate nutrition will be maintained Outcome: Progressing   Problem: Pain Managment: Goal: General experience of comfort will improve Outcome: Progressing   Problem: Skin Integrity: Goal: Risk for impaired skin integrity will decrease Outcome: Progressing

## 2018-08-21 NOTE — H&P (Signed)
The surgical history remains accurate and without interval change. The condition still exists which makes the procedure necessary. The patient and/or family is aware of their condition and has been informed of the risks and benefits of surgery, as well as alternatives. All parties have elected to proceed with surgery.     Otolaryngology Return Patient Note  Subjective: [name] is a 72 y.o. male seen in follow up today for his left parotid mass. Last seen in March. 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 He does have a history of smoking and continues to smoke 3 cigarettes/day. 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 He has significant alcohol history but states he has not had a drink in 3 months. He has been working on cutting back his smoking. The left tail of parotid mass has begun to be somewhat painful when he lies on that side. Otherwise no symptoms. He thinks it may have grown a little bit since last time but is otherwise not very bothered by this lesion. No other neck masses. Denies dysphagia, otalgia, facial paralysis. He has a history of prior stroke. Walks with a walker. Lives in assisted living facility. Has seen his vascular surgeon since I saw him last time. Plan according to records which I have reviewed personally, is to proceed with annual carotid duplex.  Past Medical History:  Diagnosis Date  . Hypertension   Past Surgical History:  Procedure Laterality Date  . TUMOR REMOVAL   No family history on file. Social History   Social History  . Marital status: Married  Spouse name: N/A  . Number of children: N/A  . Years of education: N/A   Occupational History  . Not on file.   Social History Main Topics  . Smoking status: Current Every Day Smoker  Types: Cigarettes  . Smokeless tobacco: Current User  . Alcohol use Yes  . Drug use: No  . Sexual  activity: Not on file   Other Topics Concern  . Not on file   Social History Narrative  . No narrative on file   No Known Allergies Updated Medication List:   allopurinol (ZYLOPRIM) 300 MG tablet  Class: Historical Med  amLODIPine (NORVASC) 5 MG tablet  Class: Historical Med  FLUZONE HIGH-DOSE 2018-19, PF, 180 mcg/0.5 mL injection  Sig: ADM 0.5ML IM UTD  Class: Historical Med  furosemide (LASIX) 20 MG tablet  Sig: TK 1 T PO D  Class: Historical Med  levETIRAcetam XR (KEPPRA XR) 500 mg 24 hr tablet  Sig: TK 3 TS PO D  Class: Historical Med  lisinopril (PRINIVIL,ZESTRIL) 20 MG tablet  Class: Historical Med  metoPROLOL tartrate (LOPRESSOR) 50 MG tablet  Class: Historical Med  naproxen sodium (ALEVE) 220 MG tablet  Sig - Route: Take by mouth. - Oral  Class: Historical Med  pantoprazole (PROTONIX) 40 MG tablet  Sig: TAKE 1 TABLET BY MOUTH TWICE DAILY BEFORE A MEAL  Class: Historical Med  potassium chloride (KLOR-CON) 10 MEQ CR tablet  Class: Historical Med  tamsulosin (FLOMAX) 0.4 mg Cp24 capsule  Sig: TAKE 1 CAPSULE(0.4 MG) BY MOUTH DAILY  Class: Historical Med    ROS A complete review of systems was conducted and was negative except as stated in the HPI.   Objective: Vitals:  06/28/18 1123  BP: 146/76  Pulse: 67  Resp: 16  Height: 1.727 m (5' 8" )  Weight: 81.6 kg (180  lb)  BMI (Calculated): 27.4   Physical Exam:  General Normocephalic, Awake, Alert  Eyes PERRL, no scleral icterus or conjunctival hemorrhage. EOMI.  Ears Right ear- canal patent, minimal cerumen. TM intact, no significant retractions, no middle ear effusion, normal landmarks.  Left ear- canal patent, minimal cerumen. TM intact, no significant retractions, no middle ear effusion, normal landmarks.  Nose Patent, No polyps or masses seen.  Oral Pharynx No mucosal lesions or tumors seen.  Right lower lip is status post Mohs surgery. Therefore it looks like the left lower lip is somewhat more full.  This resulted in a mildly asymmetric smile which is not due to any neurological weakness.  Lymphatics left tail of parotid mass approximately 2 cm very close the skin surface. Does not appear fixated to the sternocleidomastoid in any way, mobile, non- tender. No other palpable lymphadenopathy in the neck  Endocrine No thyroidmegaly, no thyroid masses palpated  Cardio-vascular No cyanosis, cardiac auscultation - regular rate, no murmur  Pulmonary No audible stridor, Breathing easily with no labor.  Neuro Symmetric facial movement. Tongue protrudes in midline.  Psychiatry Appropriate affect and mood for clinic visit.  Skin No scars or lesions on face or neck.  Facial nerve intact in all branches.   Assessment:  My impression is that Francisco [name] has  1. Parotid mass  .    Plan:  Left superficial parotidectomy

## 2018-08-21 NOTE — Op Note (Signed)
DATE OF PROCEDURE:  08/21/2018    PRE-OPERATIVE DIAGNOSIS: LEFT PAROTID MASS    POST-OPERATIVE DIAGNOSIS: Same    PROCEDURE(S): Left superficial parotidectomy   SURGEON: Gavin Pound, MD    ASSISTANT(S): Izora Gala, MD    ANESTHESIA: General endotracheal anesthesia     ESTIMATED BLOOD LOSS: 20 mL   SPECIMENS: Left parotid mass    COMPLICATIONS: None    OPERATIVE FINDINGS: There was a 4 cm left parotid mass involving the tail and superficial lobe. Well-circumscribed cystic mass.  Facial nerve was identified and all branches intact.     OPERATIVE DETAILS: The patient was brought to the operating room and placed in supine position on the operating table. General endotracheal anesthesia was established without difficulty. When the patient was adequately anesthetized, surgical timeout was performed and correct identification of the patient and the surgical procedure.  The patient was injected with 7 cc of 1% lidocaine 1-100,000 dilution epinephrine.  The [employer] Nerve Integrity Monitoring System (NIMS) was placed and nerve monitoring was used throughout the facial nerve dissection component of the surgical procedure.  The patient was positioned and prepped and draped in sterile fashion.  With the patient prepped and positioned, left superficial parotidectomy was undertaken.  A curvilinear incision was created in the preauricular skin crease and carried inferiorly around the earlobe and into the upper neck in a pre-existing skin crease using a #15 scalpel.  Subcutaneous soft tissue was then dissected and incised with Bovie electrocautery to the level of the superficial parotid fascia.  Dissection was carried along the superficial fascia from posterior to anterior elevating skin and muscle layer.  Dissection was then carried along the tragal cartilage from superficial to deep elevating the parotid gland anteriorly.  The inferior aspect of the incision was then dissected  carefully, the anterior aspect of the sternocleidomastoid muscle was dissected and the parotid gland was reflected anteriorly.  The main trunk of the facial nerve was then dissected, the superior and inferior divisions of the facial nerve were then followed from proximal to distal identified preserving each of the nerve branches.  The NIMS monitor was used throughout this component of the surgical procedure.  The parotid mass was identified and dissected free from the surrounding normal-appearing parotid tissue using harmonic scalpel and bipolar cautery.  The parotid specimen was then completely resected and sent to pathology for gross microscopic evaluation.  The facial nerve was then stimulated using the NIMS probe and all branches stimulated appropriately at 0.5 mV.  The parotid bed was then irrigated, hemostasis was maintained with bipolar cautery.  A 10 French round drain was then sutured to the skin with 4-0 Ethilon suture and placed in the parotid bed through the parotid skin incision.  The patient's incision was then closed in multiple layers beginning with reapproximation of the periparotid fascia using interrupted 3-0 Vicryl.  The immediate subcutaneous tissue was closed with interrupted 4-0 Vicryl suture.  The final skin closure was obtained with Dermabond surgical glue. All instrumentation was removed. The patient's skin was cleansed. He was then awakened and transported to PACU in good condition.

## 2018-08-21 NOTE — Transfer of Care (Signed)
Immediate Anesthesia Transfer of Care Note  Patient: Francisco [name]  Procedure(s) Performed: LEFT SUPERFICIAL PAROTIDECTOMY (Left Neck)  Patient Location: PACU  Anesthesia Type:General  Level of Consciousness: oriented and drowsy  Airway & Oxygen Therapy: Patient Spontanous Breathing and Patient connected to face mask  Post-op Assessment: Report given to RN and Post -op Vital signs reviewed and stable  Post vital signs: Reviewed and stable  Last Vitals:  Vitals Value Taken Time  BP    Temp    Pulse    Resp    SpO2      Last Pain:  Vitals:   08/21/18 0850  TempSrc:   PainSc: 0-No pain         Complications: No apparent anesthesia complications

## 2018-08-21 NOTE — Anesthesia Preprocedure Evaluation (Addendum)
Anesthesia Evaluation  Patient identified by MRN, date of birth, ID band Patient awake    Reviewed: Allergy & Precautions, NPO status , Patient's Chart, lab work & pertinent test results, reviewed documented beta blocker date and time   Airway Mallampati: III  TM Distance: >3 FB Neck ROM: Full    Dental  (+) Edentulous Upper, Edentulous Lower, Dental Advisory Given   Pulmonary COPD, Current Smoker,    breath sounds clear to auscultation       Cardiovascular hypertension, Pt. on medications and Pt. on home beta blockers + Peripheral Vascular Disease  + dysrhythmias (bradycardia s/p PPM) + pacemaker  Rhythm:Regular Rate:Normal  EKG: 11/30/17: Atrial sensed ventricular paced rhythm.   Carotid U/S 11/23/17 (as outlined in 03/20/18 note by Adele Barthel, MD):  B Carotid Duplex(11/23/17):  R ICA stenosis:occluded R VA: patent and antegrade L ICA stenosis:40-59% L VA: patent and antegrade  Cardiac cath 07/08/15: - Complete heart block with ventricular rates in the low 30s requiring insertion of a transvenous pacemaker. - Normal coronary arteries. - Hyperdynamic LV function with an ejection fraction of a aproximately 65%.   Echo 07/01/15: Study Conclusions - Left ventricle: The cavity size was normal. Wall thickness was increased in a pattern of moderate LVH. Systolic function wasnormal. The estimated ejection fraction was in the range of 55%to 60%. Doppler parameters are consistent with abnormal left ventricular relaxation (grade 1 diastolic dysfunction). - Aortic valve: There was mild regurgitation. - Mitral valve: Moderately calcified annulus. There was mild regurgitation. - Left atrium: The atrium was moderately to severely dilated. (Comparison echo 12/03/13, read by Loralie Champagne, MD: Normal LV size with moderate focal basal septalhypertrophy. EF 60-65%. There did appear to be an LV outflow tract gradient peak 26 mmHg at  rest increasing to68 mmHg with valsalva. The aortic valve was calcified butappeared to open well. I do not think that it is significantly stenosed. No mitral valve SAM. Normal RV size and systolic function.)   Neuro/Psych Seizures -,  negative psych ROS   GI/Hepatic GERD  ,(+)     substance abuse  alcohol use,   Endo/Other  negative endocrine ROS  Renal/GU negative Renal ROS     Musculoskeletal negative musculoskeletal ROS (+)   Abdominal   Peds  Hematology  (+) anemia ,   Anesthesia Other Findings Left parotid mass PPM interrogated. Functioning properly. Plan to use magnet if necessary. Does not need to be interrogated post op.   Reproductive/Obstetrics                          Anesthesia Physical Anesthesia Plan  ASA: III  Anesthesia Plan: General   Post-op Pain Management:    Induction: Intravenous  PONV Risk Score and Plan: 1 and Dexamethasone and Ondansetron  Airway Management Planned: Oral ETT  Additional Equipment:   Intra-op Plan:   Post-operative Plan: Extubation in OR  Informed Consent: I have reviewed the patients History and Physical, chart, labs and discussed the procedure including the risks, benefits and alternatives for the proposed anesthesia with the patient or authorized representative who has indicated his/her understanding and acceptance.   Dental advisory given  Plan Discussed with: CRNA  Anesthesia Plan Comments:         Anesthesia Quick Evaluation

## 2018-08-22 ENCOUNTER — Encounter (HOSPITAL_COMMUNITY): Payer: Self-pay | Admitting: Otolaryngology

## 2018-08-22 DIAGNOSIS — Z791 Long term (current) use of non-steroidal anti-inflammatories (NSAID): Secondary | ICD-10-CM | POA: Diagnosis not present

## 2018-08-22 DIAGNOSIS — I110 Hypertensive heart disease with heart failure: Secondary | ICD-10-CM | POA: Diagnosis not present

## 2018-08-22 DIAGNOSIS — Z79899 Other long term (current) drug therapy: Secondary | ICD-10-CM | POA: Diagnosis not present

## 2018-08-22 DIAGNOSIS — I5032 Chronic diastolic (congestive) heart failure: Secondary | ICD-10-CM | POA: Diagnosis not present

## 2018-08-22 DIAGNOSIS — Z8673 Personal history of transient ischemic attack (TIA), and cerebral infarction without residual deficits: Secondary | ICD-10-CM | POA: Diagnosis not present

## 2018-08-22 DIAGNOSIS — J449 Chronic obstructive pulmonary disease, unspecified: Secondary | ICD-10-CM | POA: Diagnosis not present

## 2018-08-22 DIAGNOSIS — Z950 Presence of cardiac pacemaker: Secondary | ICD-10-CM | POA: Diagnosis not present

## 2018-08-22 DIAGNOSIS — C4A9 Merkel cell carcinoma, unspecified: Secondary | ICD-10-CM | POA: Diagnosis not present

## 2018-08-22 DIAGNOSIS — F17210 Nicotine dependence, cigarettes, uncomplicated: Secondary | ICD-10-CM | POA: Diagnosis not present

## 2018-08-22 NOTE — Plan of Care (Signed)

## 2018-08-22 NOTE — Social Work (Signed)
CSW acknowledging consult that pt resides at [facility].   Westley Hummer, MSW, [place] Work [phone]

## 2018-08-22 NOTE — Progress Notes (Signed)
ENT Progress Note  Subjective: No nausea, vomiting No difficulty voiding Pain well controlled  Vitals:   08/22/18 0238 08/22/18 0523  BP: (!) 96/36 (!) 90/50  Pulse: 67 64  Resp: 17 18  Temp: 98.4 F (36.9 C) 98 F (36.7 C)  SpO2: 95% 98%     OBJECTIVE  Gen: alert, cooperative, appropriate Head/ENT: EOMI, neck supple, mucus membranes moist and pink, conjunctiva clear Incisions c/d/i JP with serosanguinous drainage- 43cc overnight, now with ~5cc in bulb Face moves symmetrically Respiratory: Voice without dysphonia. non-labored breathing, no accessory muscle use, normal HR, good O2 saturations    ASSESS/ PLAN  Francisco [name] is a 72 y.o. male who is POD 0 from Left superficial parotidectomy.  -pain control -MIVF- will saline lock when tolerating PO intake -Keep Dermabond dry -Drain care -Regular diet -Ambulate with assistance TID -Will stay one more night for drain- will remove and DC home in the morning tomorrow (10/18)  Helayne Seminole, MD

## 2018-08-22 NOTE — NC FL2 (Signed)
[place] LEVEL OF CARE SCREENING TOOL     IDENTIFICATION  Patient Name: Francisco [name] Birthdate: Dec 27, 1945 Sex: male Admission Date (Current Location): 08/21/2018  [place] and [place] Number:  Kathleen Argue [id number] [place] and Address:  [facility] [place], [place] [address] [place], [place] [address]      Provider Number: [id number]  Attending Physician Name and Address:  Helayne Seminole, MD  Relative Name and Phone Number:  Lavera Guise; sister; [phone]    Current Level of Care: Hospital Recommended Level of Care: [place] Prior Approval Number:    Date Approved/Denied:   PASRR Number:    Discharge Plan: Other (Comment)([facility])    Current Diagnoses: Patient Active Problem List   Diagnosis Date Noted  . Parotid mass 08/21/2018  . Carotid disease, bilateral ([place]) 03/20/2018  . Hyponatremia 12/02/2017  . Falls 12/01/2017  . History of alcohol abuse   . Chest pain 08/14/2016  . Seizure ([place])   . Gout 12/17/2015  . Convulsions ([place]) 08/17/2015  . Gait instability 08/17/2015  . Alcoholism /alcohol abuse ([place]) 08/17/2015  . Cardiac device in situ, other   . Elevated troponin   . Cardiac arrest ([place])   . Complete heart block ([place])   . Seizures ([place])   . Chronic diastolic congestive heart failure ([place])   . HLD (hyperlipidemia)   . Alcohol withdrawal ([place])   . Acute renal failure syndrome ([place])   . Hypokalemia   . Hypomagnesemia   . Absolute anemia   . Hypertrophic cardiomyopathy ([place])   . Diastolic CHF, acute on chronic (HCC)   . Essential hypertension   . Alcohol withdrawal seizure ([place]) 06/29/2015  . 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 Mild diastolic dysfunction 66/29/4765  . Acute pain of right knee 06/29/2015  . Transaminitis 06/29/2015  . Acute renal failure ([place]) 06/29/2015  . High anion gap metabolic acidosis 46/50/3546  . Acute hypokalemia 06/29/2015   . Alcohol abuse, daily use 06/10/2015  . Hyperglycemia 06/09/2015  . Vitamin D deficiency 06/09/2015  . Hyperlipidemia 06/09/2015  . Bilateral lower extremity edema (chronic) 06/09/2015  . BPH (benign prostatic hypertrophy) 08/13/2014  . Multiple duodenal ulcers 07/03/2012  . Chest pain on exertion 07/02/2012  . GIB (gastrointestinal bleeding) 07/02/2012  . GERD (gastroesophageal reflux disease)   . Heart murmur   . Microcytic anemia 06/14/2012  . Generalized weakness 06/13/2012  . Celiac disease 06/13/2012  . HTN (hypertension) 06/13/2012  . Acute hyponatremia 06/13/2012    Orientation RESPIRATION BLADDER Height & Weight     Self, Time, Situation, Place  Normal Continent Weight: 192 lb 14.4 oz (87.5 kg) Height:  5' 9"  (175.3 cm)  BEHAVIORAL SYMPTOMS/MOOD NEUROLOGICAL BOWEL NUTRITION STATUS      Continent Diet(Regular Diet; Thin Liquids)  AMBULATORY STATUS COMMUNICATION OF NEEDS Skin   Supervision Verbally Surgical wounds(closed incision on neck with liquid skin adhesive)                       Personal Care Assistance Level of Assistance  Bathing, Feeding, Dressing Bathing Assistance: Limited assistance Feeding assistance: Independent Dressing Assistance: Limited assistance     Functional Limitations Info  Sight, Hearing, Speech Sight Info: Adequate Hearing Info: Adequate Speech Info: Adequate    SPECIAL CARE FACTORS FREQUENCY                       Contractures Contractures Info: Not present  Additional Factors Info  Code Status, Allergies, Psychotropic Code Status Info: Full Code Allergies Info: No Active Allergies Psychotropic Info: levETIRAcetam (KEPPRA) tablet 1,500 mg daily PO         Current Medications (08/22/2018):  This is the current hospital active medication list Current Facility-Administered Medications  Medication Dose Route Frequency Provider Last Rate Last Dose  . acetaminophen (TYLENOL) solution 650 mg  650 mg Oral Q4H PRN  Helayne Seminole, MD   650 mg at 08/22/18 1427   Or  . acetaminophen (TYLENOL) suppository 650 mg  650 mg Rectal Q4H PRN Helayne Seminole, MD      . allopurinol (ZYLOPRIM) tablet 300 mg  300 mg Oral Daily Helayne Seminole, MD   300 mg at 08/22/18 1028  . atorvastatin (LIPITOR) tablet 20 mg  20 mg Oral QHS Marcellino, [place], MD      . docusate sodium (COLACE) capsule 100 mg  100 mg Oral Daily Helayne Seminole, MD   100 mg at 08/22/18 1029  . enoxaparin (LOVENOX) injection 40 mg  40 mg Subcutaneous Q24H Marcellino, [place] Jetty, MD      . ferrous sulfate tablet 325 mg  325 mg Oral Daily Helayne Seminole, MD   325 mg at 08/21/18 1604  . folic acid (FOLVITE) tablet 1 mg  1 mg Oral Daily Helayne Seminole, MD   1 mg at 08/22/18 1028  . furosemide (LASIX) tablet 20 mg  20 mg Oral Daily Helayne Seminole, MD   20 mg at 08/22/18 1028  . HYDROcodone-acetaminophen (NORCO/VICODIN) 5-325 MG per tablet 1-2 tablet  1-2 tablet Oral Q4H PRN Helayne Seminole, MD      . lactated ringers infusion   Intravenous Continuous Helayne Seminole, MD   Stopped at 08/22/18 1056  . levETIRAcetam (KEPPRA) tablet 1,500 mg  1,500 mg Oral Daily Helayne Seminole, MD   1,500 mg at 08/22/18 1029  . lisinopril (PRINIVIL,ZESTRIL) tablet 20 mg  20 mg Oral Daily Helayne Seminole, MD   20 mg at 08/22/18 1028  . metoprolol tartrate (LOPRESSOR) tablet 75 mg  75 mg Oral BID Helayne Seminole, MD   75 mg at 08/22/18 1028  . morphine 2 MG/ML injection 2 mg  2 mg Intravenous Q2H PRN Helayne Seminole, MD      . ondansetron [facility]) tablet 4 mg  4 mg Oral Q4H PRN Helayne Seminole, MD       Or  . ondansetron (ZOFRAN) injection 4 mg  4 mg Intravenous Q4H PRN Helayne Seminole, MD      . pantoprazole (PROTONIX) EC tablet 40 mg  40 mg Oral BID Helayne Seminole, MD   40 mg at 08/22/18 1028  . polyethylene glycol (MIRALAX / GLYCOLAX) packet 17 g  17 g Oral BID Helayne Seminole, MD      .  potassium chloride (K-DUR,KLOR-CON) CR tablet 10 mEq  10 mEq Oral Daily Helayne Seminole, MD   10 mEq at 08/22/18 1028  . tamsulosin (FLOMAX) capsule 0.4 mg  0.4 mg Oral Daily Helayne Seminole, MD   0.4 mg at 08/22/18 1028  . thiamine (VITAMIN B-1) tablet 100 mg  100 mg Oral Daily Helayne Seminole, MD   100 mg at 08/22/18 1028     Discharge Medications: Please see discharge summary for a list of discharge medications.  Relevant Imaging Results:  Relevant Lab Results:   Additional Information SS# [place]  H Abbigale Mcelhaney, LCSWA

## 2018-08-22 NOTE — Anesthesia Postprocedure Evaluation (Signed)
Anesthesia Post Note  Patient: Francisco [name]  Procedure(s) Performed: LEFT SUPERFICIAL PAROTIDECTOMY (Left Neck)     Patient location during evaluation: PACU Anesthesia Type: General Level of consciousness: awake and alert Pain management: pain level controlled Vital Signs Assessment: post-procedure vital signs reviewed and stable Respiratory status: spontaneous breathing, nonlabored ventilation, respiratory function stable and patient connected to nasal cannula oxygen Cardiovascular status: blood pressure returned to baseline and stable Postop Assessment: no apparent nausea or vomiting Anesthetic complications: no    Last Vitals:  Vitals:   08/22/18 1420 08/22/18 1741  BP: (!) 107/50 (!) 117/58  Pulse: 64 64  Resp: 18 18  Temp: 37 C 36.6 C  SpO2: 100% 100%    Last Pain:  Vitals:   08/22/18 1741  TempSrc: Oral  PainSc:    Pain Goal: Patients Stated Pain Goal: 3 (08/22/18 1426)               Mckinzie Saksa L Brunetta Newingham

## 2018-08-23 DIAGNOSIS — E871 Hypo-osmolality and hyponatremia: Secondary | ICD-10-CM | POA: Diagnosis not present

## 2018-08-23 DIAGNOSIS — I110 Hypertensive heart disease with heart failure: Secondary | ICD-10-CM | POA: Diagnosis not present

## 2018-08-23 DIAGNOSIS — Z950 Presence of cardiac pacemaker: Secondary | ICD-10-CM | POA: Diagnosis not present

## 2018-08-23 DIAGNOSIS — C4A9 Merkel cell carcinoma, unspecified: Secondary | ICD-10-CM | POA: Diagnosis not present

## 2018-08-23 DIAGNOSIS — Z79899 Other long term (current) drug therapy: Secondary | ICD-10-CM | POA: Diagnosis not present

## 2018-08-23 DIAGNOSIS — F17210 Nicotine dependence, cigarettes, uncomplicated: Secondary | ICD-10-CM | POA: Diagnosis not present

## 2018-08-23 DIAGNOSIS — I5032 Chronic diastolic (congestive) heart failure: Secondary | ICD-10-CM | POA: Diagnosis not present

## 2018-08-23 DIAGNOSIS — J449 Chronic obstructive pulmonary disease, unspecified: Secondary | ICD-10-CM | POA: Diagnosis not present

## 2018-08-23 DIAGNOSIS — Z8673 Personal history of transient ischemic attack (TIA), and cerebral infarction without residual deficits: Secondary | ICD-10-CM | POA: Diagnosis not present

## 2018-08-23 DIAGNOSIS — Z791 Long term (current) use of non-steroidal anti-inflammatories (NSAID): Secondary | ICD-10-CM | POA: Diagnosis not present

## 2018-08-23 NOTE — Clinical Social Work Note (Addendum)
CSW facilitated patient discharge including contacting facility to confirm patient discharge plans. Patient declined for CSW to call family, stating he would call when he got back to the facility. Clinical information faxed to facility and family agreeable with plan. ALF staff member will pick him up to transport him [facility]. They will call RN when they are 5-10 minutes away. RN to call report prior to discharge [phone]).  CSW will sign off for now as social work intervention is no longer needed. Please consult [place] again if new needs arise.  Dayton Scrape, [place]

## 2018-08-23 NOTE — NC FL2 (Signed)
[place] LEVEL OF CARE SCREENING TOOL     IDENTIFICATION  Patient Name: Francisco [name] Birthdate: 1946/03/30 Sex: male Admission Date (Current Location): 08/21/2018  [place] and [place] Number:  Kathleen Argue [id number] [place] and Address:  [facility] [place], [place] [address], [place], [place] [address]      Provider Number: [id number]  Attending Physician Name and Address:  Helayne Seminole, MD  Relative Name and Phone Number:  Lavera Guise; sister; [phone]    Current Level of Care: Hospital Recommended Level of Care: [place] Prior Approval Number:    Date Approved/Denied:   PASRR Number:    Discharge Plan: Other (Comment)([facility])    Current Diagnoses: Patient Active Problem List   Diagnosis Date Noted  . Parotid mass 08/21/2018  . Carotid disease, bilateral ([place]) 03/20/2018  . Hyponatremia 12/02/2017  . Falls 12/01/2017  . History of alcohol abuse   . Chest pain 08/14/2016  . Seizure ([place])   . Gout 12/17/2015  . Convulsions ([place]) 08/17/2015  . Gait instability 08/17/2015  . Alcoholism /alcohol abuse ([place]) 08/17/2015  . Cardiac device in situ, other   . Elevated troponin   . Cardiac arrest ([place])   . Complete heart block ([place])   . Seizures ([place])   . Chronic diastolic congestive heart failure ([place])   . HLD (hyperlipidemia)   . Alcohol withdrawal ([place])   . Acute renal failure syndrome ([place])   . Hypokalemia   . Hypomagnesemia   . Absolute anemia   . Hypertrophic cardiomyopathy ([place])   . Diastolic CHF, acute on chronic (HCC)   . Essential hypertension   . Alcohol withdrawal seizure ([place]) 06/29/2015  . Hypertrophic obstructive cardiomyopathy (HOCM) ([place]) 06/29/2015  . Mild diastolic dysfunction 90/30/0923  . Acute pain of right knee 06/29/2015  . Transaminitis 06/29/2015  . Acute renal failure ([place]) 06/29/2015  . High anion gap metabolic acidosis 30/05/6225  . Acute hypokalemia 06/29/2015   . Alcohol abuse, daily use 06/10/2015  . Hyperglycemia 06/09/2015  . Vitamin D deficiency 06/09/2015  . Hyperlipidemia 06/09/2015  . Bilateral lower extremity edema (chronic) 06/09/2015  . BPH (benign prostatic hypertrophy) 08/13/2014  . Multiple duodenal ulcers 07/03/2012  . Chest pain on exertion 07/02/2012  . GIB (gastrointestinal bleeding) 07/02/2012  . GERD (gastroesophageal reflux disease)   . Heart murmur   . Microcytic anemia 06/14/2012  . Generalized weakness 06/13/2012  . Celiac disease 06/13/2012  . HTN (hypertension) 06/13/2012  . Acute hyponatremia 06/13/2012    Orientation RESPIRATION BLADDER Height & Weight     Self, Time, Situation, Place  Normal Continent Weight: 192 lb 14.4 oz (87.5 kg) Height:  5' 9"  (175.3 cm)  BEHAVIORAL SYMPTOMS/MOOD NEUROLOGICAL BOWEL NUTRITION STATUS      Continent Diet(Regular Diet; Thin Liquids)  AMBULATORY STATUS COMMUNICATION OF NEEDS Skin   Supervision Verbally Surgical wounds(closed incision on neck with liquid skin adhesive)                       Personal Care Assistance Level of Assistance  Bathing, Feeding, Dressing Bathing Assistance: Limited assistance Feeding assistance: Independent Dressing Assistance: Limited assistance     Functional Limitations Info  Sight, Hearing, Speech Sight Info: Adequate Hearing Info: Adequate Speech Info: Adequate    SPECIAL CARE FACTORS FREQUENCY                       Contractures Contractures Info: Not present  Additional Factors Info  Code Status, Allergies, Psychotropic Code Status Info: Full Code Allergies Info: No Active Allergies Psychotropic Info: levETIRAcetam (KEPPRA) tablet 1,500 mg daily PO         Current Medications (08/23/2018):  This is the current hospital active medication list Current Facility-Administered Medications  Medication Dose Route Frequency Provider Last Rate Last Dose  . acetaminophen (TYLENOL) solution 650 mg  650 mg Oral Q4H PRN  Helayne Seminole, MD   650 mg at 08/23/18 0950   Or  . acetaminophen (TYLENOL) suppository 650 mg  650 mg Rectal Q4H PRN Helayne Seminole, MD      . allopurinol (ZYLOPRIM) tablet 300 mg  300 mg Oral Daily Helayne Seminole, MD   300 mg at 08/23/18 0950  . atorvastatin (LIPITOR) tablet 20 mg  20 mg Oral QHS Helayne Seminole, MD   20 mg at 08/22/18 2021  . docusate sodium (COLACE) capsule 100 mg  100 mg Oral Daily Helayne Seminole, MD   100 mg at 08/23/18 0949  . enoxaparin (LOVENOX) injection 40 mg  40 mg Subcutaneous Q24H Marcellino, [place], MD      . ferrous sulfate tablet 325 mg  325 mg Oral Daily Helayne Seminole, MD   325 mg at 08/23/18 0950  . folic acid (FOLVITE) tablet 1 mg  1 mg Oral Daily Helayne Seminole, MD   1 mg at 08/23/18 0949  . furosemide (LASIX) tablet 20 mg  20 mg Oral Daily Helayne Seminole, MD   20 mg at 08/23/18 0950  . HYDROcodone-acetaminophen (NORCO/VICODIN) 5-325 MG per tablet 1-2 tablet  1-2 tablet Oral Q4H PRN Helayne Seminole, MD   2 tablet at 08/22/18 2021  . lactated ringers infusion   Intravenous Continuous Helayne Seminole, MD   Stopped at 08/22/18 [phone]  . levETIRAcetam (KEPPRA) tablet 1,500 mg  1,500 mg Oral Daily Helayne Seminole, MD   1,500 mg at 08/23/18 0949  . lisinopril (PRINIVIL,ZESTRIL) tablet 20 mg  20 mg Oral Daily Helayne Seminole, MD   20 mg at 08/23/18 0949  . metoprolol tartrate (LOPRESSOR) tablet 75 mg  75 mg Oral BID Helayne Seminole, MD   75 mg at 08/23/18 0949  . morphine 2 MG/ML injection 2 mg  2 mg Intravenous Q2H PRN Helayne Seminole, MD      . ondansetron [facility]) tablet 4 mg  4 mg Oral Q4H PRN Helayne Seminole, MD       Or  . ondansetron [facility]) injection 4 mg  4 mg Intravenous Q4H PRN Helayne Seminole, MD      . pantoprazole (PROTONIX) EC tablet 40 mg  40 mg Oral BID Helayne Seminole, MD   40 mg at 08/23/18 0950  . polyethylene glycol (MIRALAX / GLYCOLAX) packet 17 g  17  g Oral BID Helayne Seminole, MD   17 g at 08/23/18 0949  . potassium chloride (K-DUR,KLOR-CON) CR tablet 10 mEq  10 mEq Oral Daily Helayne Seminole, MD   10 mEq at 08/23/18 0949  . tamsulosin (FLOMAX) capsule 0.4 mg  0.4 mg Oral Daily Helayne Seminole, MD   0.4 mg at 08/23/18 0950  . thiamine (VITAMIN B-1) tablet 100 mg  100 mg Oral Daily Helayne Seminole, MD   100 mg at 08/23/18 0950     Discharge Medications: STOP taking these medications      ALEVE 220 MG tablet Generic drug:  naproxen sodium  TAKE these medications     Indication  allopurinol 300 MG tablet Commonly known as:  ZYLOPRIM TAKE 1 TABLET(300 MG) BY MOUTH DAILY What changed:  See the new instructions.    amLODipine 5 MG tablet Commonly known as:  NORVASC TAKE 1 TABLET(5 MG) BY MOUTH DAILY    atorvastatin 20 MG tablet Commonly known as:  LIPITOR Take 1 tablet (20 mg total) by mouth daily.    clotrimazole-betamethasone cream Commonly known as:  LOTRISONE APPLY EXTERNALLY TO THE AFFECTED AREA TWICE DAILY    colchicine 0.6 MG tablet Take 1 tablet (0.6 mg total) by mouth 2 (two) times daily as needed (gout flare). Take twice daily for 2 weeks, then use as needed for gout flare up    docusate sodium 100 MG capsule Commonly known as:  COLACE Take 100 mg by mouth daily.    ferrous sulfate 325 (65 FE) MG tablet Take 1 tablet (325 mg total) by mouth daily.    fluticasone 50 MCG/ACT nasal spray Commonly known as:  FLONASE Place 2 sprays into both nostrils daily.    folic acid 1 MG tablet Commonly known as:  FOLVITE Take 1 tablet (1 mg total) by mouth daily.    furosemide 20 MG tablet Commonly known as:  LASIX TAKE 1 TABLET BY MOUTH DAILY    guaiFENesin 600 MG 12 hr tablet Commonly known as:  MUCINEX Take 600-1,200 mg by mouth 2 (two) times daily as needed for cough.    guaiFENesin 100 MG/5ML liquid Commonly known as:  ROBITUSSIN Take 200 mg by mouth  every 4 (four) hours as needed for cough.    HYDROcodone-acetaminophen 5-325 MG tablet Commonly known as:  NORCO/VICODIN Take 1 tablet by mouth every 4 (four) hours as needed for moderate pain.    levETIRAcetam 500 MG tablet Commonly known as:  KEPPRA Take 1,500 mg by mouth daily.    levETIRAcetam 500 MG 24 hr tablet Commonly known as:  KEPPRA XR TAKE 3 TABLETS BY MOUTH DAILY    lisinopril 20 MG tablet Commonly known as:  PRINIVIL,ZESTRIL TAKE 1 TABLET(20 MG) BY MOUTH DAILY What changed:  See the new instructions.    metoprolol tartrate 50 MG tablet Commonly known as:  LOPRESSOR Take 1.5 tablets (75 mg total) by mouth 2 (two) times daily.    multivitamin with minerals Tabs tablet Take 1 tablet by mouth daily.    pantoprazole 40 MG tablet Commonly known as:  PROTONIX TAKE 1 TABLET BY MOUTH TWICE DAILY BEFORE A MEAL What changed:  See the new instructions.    polyethylene glycol packet Commonly known as:  MIRALAX / GLYCOLAX Take 17 g by mouth 2 (two) times daily.    potassium chloride 10 MEQ tablet Commonly known as:  K-DUR TAKE 1 TABLET(10 MEQ) BY MOUTH DAILY What changed:  See the new instructions.    tamsulosin 0.4 MG Caps capsule Commonly known as:  FLOMAX TAKE 1 CAPSULE(0.4 MG) BY MOUTH DAILY What changed:  See the new instructions.    thiamine 100 MG tablet Take 1 tablet (100 mg total) by mouth daily.       Relevant Imaging Results:  Relevant Lab Results:   Additional Information SS# [place], LCSW

## 2018-08-23 NOTE — Discharge Summary (Signed)
Physician Discharge Summary Note  Patient:  Francisco [name] is an 72 y.o., male MRN:  [id number] DOB:  [birth year] Patient phone:  [phone] (home)  Patient address:   [place] [address],    Date of Admission:  08/21/2018 Date of Discharge: 08/23/18  Reason for Admission:  Patient underwent left superficial parotidectomy on 08/21/18. Stayed overnight two nights for post op care and drain management. The drain output slowed and was removed on POD2. The facial nerve remained intact. There were no complications. Discharged on POD2.    Principal Problem: Parotid mass Discharge Diagnoses: Patient Active Problem List   Diagnosis Date Noted  . Parotid mass [K11.8] 08/21/2018  . Carotid disease, bilateral ([place]) [I73.9] 03/20/2018  . Hyponatremia [E87.1] 12/02/2017  . Falls [phone]] 12/01/2017  . History of alcohol abuse [F10.11]   . Chest pain [R07.9] 08/14/2016  . Seizure ([place]) [R56.9]   . Gout [M10.9] 12/17/2015  . Convulsions ([place]) [R56.9] 08/17/2015  . Gait instability [R26.81] 08/17/2015  . Alcoholism /alcohol abuse ([place]) [F10.20] 08/17/2015  . Cardiac device in situ, other [Z95.818]   . Elevated troponin [R79.89]   . Cardiac arrest ([place]) [I46.9]   . Complete heart block ([place]) [I44.2]   . Seizures ([place]) [R56.9]   . Chronic diastolic congestive heart failure ([place]) [I50.32]   . HLD (hyperlipidemia) [E78.5]   . Alcohol withdrawal ([place]) [F10.239]   . Acute renal failure syndrome ([place]) [N17.9]   . Hypokalemia [E87.6]   . Hypomagnesemia [E83.42]   . Absolute anemia [D64.9]   . Hypertrophic cardiomyopathy ([place]) [I42.2]   . Diastolic CHF, acute on chronic (HCC) [I50.33]   . Essential hypertension [I10]   . Alcohol withdrawal seizure ([place]) [D17.616, R56.9] 06/29/2015  . Hypertrophic obstructive cardiomyopathy (HOCM) ([place]) [I42.1] 06/29/2015  . Mild diastolic dysfunction [W73.7] 06/29/2015  . Acute pain of right knee [M25.561] 06/29/2015   . Transaminitis [R74.0] 06/29/2015  . Acute renal failure ([place]) [N17.9] 06/29/2015  . High anion gap metabolic acidosis [T06.2] 06/29/2015  . Acute hypokalemia [E87.6] 06/29/2015  . Alcohol abuse, daily use [F10.10] 06/10/2015  . Hyperglycemia [R73.9] 06/09/2015  . Vitamin D deficiency [E55.9] 06/09/2015  . Hyperlipidemia [E78.5] 06/09/2015  . Bilateral lower extremity edema (chronic) [R60.0] 06/09/2015  . BPH (benign prostatic hypertrophy) [N40.0] 08/13/2014  . Multiple duodenal ulcers [K29.81] 07/03/2012  . Chest pain on exertion [R07.9] 07/02/2012  . GIB (gastrointestinal bleeding) [K92.2] 07/02/2012  . GERD (gastroesophageal reflux disease) [K21.9]   . Heart murmur [R01.1]   . Microcytic anemia [D50.9] 06/14/2012  . Generalized weakness [R53.1] 06/13/2012  . Celiac disease [K90.0] 06/13/2012  . HTN (hypertension) [I10] 06/13/2012  . Acute hyponatremia [E87.1] 06/13/2012      Past Medical History:  Past Medical History:  Diagnosis Date  . Anemia   . Anginal pain ([place])    normal coronaries 2016  . Celiac disease   . COPD (chronic obstructive pulmonary disease) ([place])   . GERD (gastroesophageal reflux disease)   . Heart murmur   . Hypertension   . Multiple duodenal ulcers   . Presence of permanent cardiac pacemaker   . Right internal carotid occlusion   . Seizures ([place])   . Shortness of breath   . Symptomatic Bradycardia    a. 07/2015 s/p MDT Advisa L DC PPM (Ser #: [id number]).  . 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 CARDIAC CATHETERIZATION N/A 07/08/2015   Procedure: Left  Heart Cath and Coronary Angiography;  Surgeon: Troy Sine, MD;  Location: [facility];  Service: Cardiovascular;  Laterality: N/A;  . CARDIAC CATHETERIZATION N/A 07/08/2015   Procedure: Temporary Pacemaker;  Surgeon: Troy Sine, MD;  Location: [facility];  Service: Cardiovascular;  Laterality:  N/A;  . CARDIAC CATHETERIZATION N/A 07/09/2015   Procedure: Temporary Pacemaker;  Surgeon: Troy Sine, MD;  Location: [facility];  Service: Cardiovascular;  Laterality: N/A;  . EP IMPLANTABLE DEVICE N/A 07/09/2015   Procedure: Pacemaker Implant;  Surgeon: Will Meredith Leeds, MD;  Location: [facility] [facility];  Service: Cardiovascular;  Laterality: N/A;  . ESOPHAGOGASTRODUODENOSCOPY  07/03/2012   Procedure: ESOPHAGOGASTRODUODENOSCOPY (EGD);  Surgeon: Winfield Cunas., MD;  Location: [facility];  Service: Endoscopy;  Laterality: N/A;  . HEMORRHOID SURGERY    . HERNIA REPAIR    . MOLE REMOVAL    . PAROTIDECTOMY Left 08/21/2018   superficial  . PAROTIDECTOMY Left 08/21/2018   Procedure: LEFT SUPERFICIAL PAROTIDECTOMY;  Surgeon: Helayne Seminole, MD;  Location: [facility] OR;  Service: ENT;  Laterality: Left;   Family History:  Family History  Problem Relation Age of Onset  . Hodgkin's lymphoma Father     Social History:  Social History   Substance and Sexual Activity  Alcohol Use Yes     Social History   Substance and Sexual Activity  Drug Use No    Social History   Socioeconomic History  . Marital status: Single    Spouse name: Not on file  . Number of children: Not on file  . Years of education: Not on file  . Highest education level: Not on file  Occupational History  . Not on file  Social Needs  . Financial resource strain: Not on file  . Food insecurity:    Worry: Not on file    Inability: Not on file  . Transportation needs:    Medical: Not on file    Non-medical: Not on file  Tobacco Use  . Smoking status: Current Every Day Smoker    Packs/day: 0.25    Years: 50.00    Pack years: 12.50    Types: Cigarettes  . Smokeless tobacco: Former Network engineer and Sexual Activity  . Alcohol use: Yes  . Drug use: No  . Sexual activity: Never  Lifestyle  . Physical activity:    Days per week: Not on file    Minutes per session: Not on file  . Stress:  Not on file  Relationships  . Social connections:    Talks on phone: Not on file    Gets together: Not on file    Attends religious service: Not on file    Active member of club or organization: Not on file    Attends meetings of clubs or organizations: Not on file    Relationship status: Not on file  Other Topics Concern  . Not on file  Social History Narrative   Lives at [facility], [place]   Physical Exam  Constitutional: He is oriented to person, place, and time and well-developed, well-nourished, and in no distress. No distress.  HENT:  Head: Normocephalic and atraumatic.  Right Ear: External ear normal.  Left Ear: External ear normal.  Mouth/Throat: No oropharyngeal exudate.  Eyes: Pupils are equal, round, and reactive to light. Conjunctivae are normal.  Neck: Normal range of motion. Neck supple. No tracheal deviation present.    Left modified Blair incision is c/d/i  Facial nerve symmetric.    Cardiovascular: Normal rate and regular rhythm.  Pulmonary/Chest: Effort normal and breath sounds normal.  Abdominal: Soft. Bowel sounds are normal. He exhibits no distension.  Musculoskeletal: Normal range of motion.  Lymphadenopathy:    He has no cervical adenopathy.  Neurological: He is alert and oriented to person, place, and time. GCS score is 15.  Skin: Skin is warm and dry. He is not diaphoretic.    Blood Alcohol level:  Lab Results  Component Value Date   ETH <10 11/30/2017   ETH <5 04/29/7627    Metabolic Disorder Labs:  Lab Results  Component Value Date   HGBA1C 5.9 (H) 06/09/2015   MPG 123 (H) 06/09/2015   MPG 146 (H) 11/13/2013   No results found for: PROLACTIN Lab Results  Component Value Date   CHOL 154 02/22/2017   TRIG 67 02/22/2017   HDL 69 02/22/2017   CHOLHDL 2.2 02/22/2017   VLDL 13 02/22/2017   LDLCALC 72 02/22/2017   LDLCALC 65 06/09/2015    See Psychiatric Specialty Exam and Suicide Risk Assessment completed by Attending Physician  prior to discharge.  Discharge destination:  Return to SNF  Discharge Instructions    Diet - low sodium heart healthy   Complete by:  As directed    Increase activity slowly   Complete by:  As directed     [place].    Allergies as of 08/23/2018   No Active Allergies     Medication List    STOP taking these medications   ALEVE 220 MG tablet Generic drug:  naproxen sodium     TAKE these medications     Indication  allopurinol 300 MG tablet Commonly known as:  ZYLOPRIM TAKE 1 TABLET(300 MG) BY MOUTH DAILY What changed:  See the new instructions.    amLODipine 5 MG tablet Commonly known as:  NORVASC TAKE 1 TABLET(5 MG) BY MOUTH DAILY    atorvastatin 20 MG tablet Commonly known as:  LIPITOR Take 1 tablet (20 mg total) by mouth daily.    clotrimazole-betamethasone cream Commonly known as:  LOTRISONE APPLY EXTERNALLY TO THE AFFECTED AREA TWICE DAILY    colchicine 0.6 MG tablet Take 1 tablet (0.6 mg total) by mouth 2 (two) times daily as needed (gout flare). Take twice daily for 2 weeks, then use as needed for gout flare up    docusate sodium 100 MG capsule Commonly known as:  COLACE Take 100 mg by mouth daily.    ferrous sulfate 325 (65 FE) MG tablet Take 1 tablet (325 mg total) by mouth daily.    fluticasone 50 MCG/ACT nasal spray Commonly known as:  FLONASE Place 2 sprays into both nostrils daily.    folic acid 1 MG tablet Commonly known as:  FOLVITE Take 1 tablet (1 mg total) by mouth daily.    furosemide 20 MG tablet Commonly known as:  LASIX TAKE 1 TABLET BY MOUTH DAILY    guaiFENesin 600 MG 12 hr tablet Commonly known as:  MUCINEX Take 600-1,200 mg by mouth 2 (two) times daily as needed for cough.    guaiFENesin 100 MG/5ML liquid Commonly known as:  ROBITUSSIN Take 200 mg by mouth every 4 (four) hours as needed for cough.    HYDROcodone-acetaminophen 5-325 MG tablet Commonly known as:  NORCO/VICODIN Take 1  tablet by mouth every 4 (four) hours as needed for moderate pain.    levETIRAcetam 500 MG tablet Commonly known  as:  KEPPRA Take 1,500 mg by mouth daily.    levETIRAcetam 500 MG 24 hr tablet Commonly known as:  KEPPRA XR TAKE 3 TABLETS BY MOUTH DAILY    lisinopril 20 MG tablet Commonly known as:  PRINIVIL,ZESTRIL TAKE 1 TABLET(20 MG) BY MOUTH DAILY What changed:  See the new instructions.    metoprolol tartrate 50 MG tablet Commonly known as:  LOPRESSOR Take 1.5 tablets (75 mg total) by mouth 2 (two) times daily.    multivitamin with minerals Tabs tablet Take 1 tablet by mouth daily.    pantoprazole 40 MG tablet Commonly known as:  PROTONIX TAKE 1 TABLET BY MOUTH TWICE DAILY BEFORE A MEAL What changed:  See the new instructions.    polyethylene glycol packet Commonly known as:  MIRALAX / GLYCOLAX Take 17 g by mouth 2 (two) times daily.    potassium chloride 10 MEQ tablet Commonly known as:  K-DUR TAKE 1 TABLET(10 MEQ) BY MOUTH DAILY What changed:  See the new instructions.    tamsulosin 0.4 MG Caps capsule Commonly known as:  FLOMAX TAKE 1 CAPSULE(0.4 MG) BY MOUTH DAILY What changed:  See the new instructions.    thiamine 100 MG tablet Take 1 tablet (100 mg total) by mouth daily.       Follow-up Information    Helayne Seminole, MD. Schedule an appointment as soon as possible for a visit in 2 weeks.   Specialty:  Otolaryngology Why:  For wound re-check Contact information: [place] [address] [phone]           Follow-up recommendations: with Dr. Blenda Nicely in 2 weeks  Signed: Helayne Seminole, MD 08/23/2018, 10:20 AM

## 2018-08-24 DIAGNOSIS — E871 Hypo-osmolality and hyponatremia: Secondary | ICD-10-CM | POA: Diagnosis not present

## 2018-08-25 DIAGNOSIS — E871 Hypo-osmolality and hyponatremia: Secondary | ICD-10-CM | POA: Diagnosis not present

## 2018-08-26 DIAGNOSIS — R296 Repeated falls: Secondary | ICD-10-CM | POA: Diagnosis not present

## 2018-08-26 DIAGNOSIS — R2689 Other abnormalities of gait and mobility: Secondary | ICD-10-CM | POA: Diagnosis not present

## 2018-08-26 DIAGNOSIS — M6281 Muscle weakness (generalized): Secondary | ICD-10-CM | POA: Diagnosis not present

## 2018-08-26 DIAGNOSIS — R2681 Unsteadiness on feet: Secondary | ICD-10-CM | POA: Diagnosis not present

## 2018-08-26 DIAGNOSIS — E871 Hypo-osmolality and hyponatremia: Secondary | ICD-10-CM | POA: Diagnosis not present

## 2018-08-26 DIAGNOSIS — R278 Other lack of coordination: Secondary | ICD-10-CM | POA: Diagnosis not present

## 2018-08-27 DIAGNOSIS — R278 Other lack of coordination: Secondary | ICD-10-CM | POA: Diagnosis not present

## 2018-08-27 DIAGNOSIS — E871 Hypo-osmolality and hyponatremia: Secondary | ICD-10-CM | POA: Diagnosis not present

## 2018-08-27 DIAGNOSIS — R296 Repeated falls: Secondary | ICD-10-CM | POA: Diagnosis not present

## 2018-08-27 DIAGNOSIS — M6281 Muscle weakness (generalized): Secondary | ICD-10-CM | POA: Diagnosis not present

## 2018-08-27 DIAGNOSIS — R2689 Other abnormalities of gait and mobility: Secondary | ICD-10-CM | POA: Diagnosis not present

## 2018-08-27 DIAGNOSIS — R2681 Unsteadiness on feet: Secondary | ICD-10-CM | POA: Diagnosis not present

## 2018-08-28 DIAGNOSIS — M6281 Muscle weakness (generalized): Secondary | ICD-10-CM | POA: Diagnosis not present

## 2018-08-28 DIAGNOSIS — R2681 Unsteadiness on feet: Secondary | ICD-10-CM | POA: Diagnosis not present

## 2018-08-28 DIAGNOSIS — R296 Repeated falls: Secondary | ICD-10-CM | POA: Diagnosis not present

## 2018-08-28 DIAGNOSIS — R2689 Other abnormalities of gait and mobility: Secondary | ICD-10-CM | POA: Diagnosis not present

## 2018-08-28 DIAGNOSIS — R278 Other lack of coordination: Secondary | ICD-10-CM | POA: Diagnosis not present

## 2018-08-28 DIAGNOSIS — E871 Hypo-osmolality and hyponatremia: Secondary | ICD-10-CM | POA: Diagnosis not present

## 2018-08-29 DIAGNOSIS — R296 Repeated falls: Secondary | ICD-10-CM | POA: Diagnosis not present

## 2018-08-29 DIAGNOSIS — R2681 Unsteadiness on feet: Secondary | ICD-10-CM | POA: Diagnosis not present

## 2018-08-29 DIAGNOSIS — R2689 Other abnormalities of gait and mobility: Secondary | ICD-10-CM | POA: Diagnosis not present

## 2018-08-29 DIAGNOSIS — R278 Other lack of coordination: Secondary | ICD-10-CM | POA: Diagnosis not present

## 2018-08-29 DIAGNOSIS — M6281 Muscle weakness (generalized): Secondary | ICD-10-CM | POA: Diagnosis not present

## 2018-08-29 DIAGNOSIS — E871 Hypo-osmolality and hyponatremia: Secondary | ICD-10-CM | POA: Diagnosis not present

## 2018-08-30 DIAGNOSIS — E871 Hypo-osmolality and hyponatremia: Secondary | ICD-10-CM | POA: Diagnosis not present

## 2018-08-31 DIAGNOSIS — E871 Hypo-osmolality and hyponatremia: Secondary | ICD-10-CM | POA: Diagnosis not present

## 2018-09-01 DIAGNOSIS — E871 Hypo-osmolality and hyponatremia: Secondary | ICD-10-CM | POA: Diagnosis not present

## 2018-09-02 DIAGNOSIS — R2681 Unsteadiness on feet: Secondary | ICD-10-CM | POA: Diagnosis not present

## 2018-09-02 DIAGNOSIS — E871 Hypo-osmolality and hyponatremia: Secondary | ICD-10-CM | POA: Diagnosis not present

## 2018-09-02 DIAGNOSIS — R296 Repeated falls: Secondary | ICD-10-CM | POA: Diagnosis not present

## 2018-09-02 DIAGNOSIS — R2689 Other abnormalities of gait and mobility: Secondary | ICD-10-CM | POA: Diagnosis not present

## 2018-09-02 DIAGNOSIS — R278 Other lack of coordination: Secondary | ICD-10-CM | POA: Diagnosis not present

## 2018-09-02 DIAGNOSIS — M6281 Muscle weakness (generalized): Secondary | ICD-10-CM | POA: Diagnosis not present

## 2018-09-03 ENCOUNTER — Other Ambulatory Visit (HOSPITAL_COMMUNITY): Payer: Self-pay | Admitting: Otolaryngology

## 2018-09-03 DIAGNOSIS — M6281 Muscle weakness (generalized): Secondary | ICD-10-CM | POA: Diagnosis not present

## 2018-09-03 DIAGNOSIS — E871 Hypo-osmolality and hyponatremia: Secondary | ICD-10-CM | POA: Diagnosis not present

## 2018-09-03 DIAGNOSIS — C801 Malignant (primary) neoplasm, unspecified: Secondary | ICD-10-CM

## 2018-09-03 DIAGNOSIS — R2681 Unsteadiness on feet: Secondary | ICD-10-CM | POA: Diagnosis not present

## 2018-09-03 DIAGNOSIS — C4A9 Merkel cell carcinoma, unspecified: Secondary | ICD-10-CM

## 2018-09-03 DIAGNOSIS — R296 Repeated falls: Secondary | ICD-10-CM | POA: Diagnosis not present

## 2018-09-03 DIAGNOSIS — R2689 Other abnormalities of gait and mobility: Secondary | ICD-10-CM | POA: Diagnosis not present

## 2018-09-03 DIAGNOSIS — R278 Other lack of coordination: Secondary | ICD-10-CM | POA: Diagnosis not present

## 2018-09-04 DIAGNOSIS — R278 Other lack of coordination: Secondary | ICD-10-CM | POA: Diagnosis not present

## 2018-09-04 DIAGNOSIS — R296 Repeated falls: Secondary | ICD-10-CM | POA: Diagnosis not present

## 2018-09-04 DIAGNOSIS — R2689 Other abnormalities of gait and mobility: Secondary | ICD-10-CM | POA: Diagnosis not present

## 2018-09-04 DIAGNOSIS — R2681 Unsteadiness on feet: Secondary | ICD-10-CM | POA: Diagnosis not present

## 2018-09-04 DIAGNOSIS — M6281 Muscle weakness (generalized): Secondary | ICD-10-CM | POA: Diagnosis not present

## 2018-09-04 DIAGNOSIS — E871 Hypo-osmolality and hyponatremia: Secondary | ICD-10-CM | POA: Diagnosis not present

## 2018-09-05 DIAGNOSIS — R2689 Other abnormalities of gait and mobility: Secondary | ICD-10-CM | POA: Diagnosis not present

## 2018-09-05 DIAGNOSIS — R278 Other lack of coordination: Secondary | ICD-10-CM | POA: Diagnosis not present

## 2018-09-05 DIAGNOSIS — E871 Hypo-osmolality and hyponatremia: Secondary | ICD-10-CM | POA: Diagnosis not present

## 2018-09-05 DIAGNOSIS — M6281 Muscle weakness (generalized): Secondary | ICD-10-CM | POA: Diagnosis not present

## 2018-09-05 DIAGNOSIS — R296 Repeated falls: Secondary | ICD-10-CM | POA: Diagnosis not present

## 2018-09-05 DIAGNOSIS — R2681 Unsteadiness on feet: Secondary | ICD-10-CM | POA: Diagnosis not present

## 2018-09-06 DIAGNOSIS — R2689 Other abnormalities of gait and mobility: Secondary | ICD-10-CM | POA: Diagnosis not present

## 2018-09-06 DIAGNOSIS — R296 Repeated falls: Secondary | ICD-10-CM | POA: Diagnosis not present

## 2018-09-06 DIAGNOSIS — E871 Hypo-osmolality and hyponatremia: Secondary | ICD-10-CM | POA: Diagnosis not present

## 2018-09-06 DIAGNOSIS — M6281 Muscle weakness (generalized): Secondary | ICD-10-CM | POA: Diagnosis not present

## 2018-09-06 DIAGNOSIS — R2681 Unsteadiness on feet: Secondary | ICD-10-CM | POA: Diagnosis not present

## 2018-09-06 DIAGNOSIS — R278 Other lack of coordination: Secondary | ICD-10-CM | POA: Diagnosis not present

## 2018-09-07 DIAGNOSIS — E871 Hypo-osmolality and hyponatremia: Secondary | ICD-10-CM | POA: Diagnosis not present

## 2018-09-08 DIAGNOSIS — E871 Hypo-osmolality and hyponatremia: Secondary | ICD-10-CM | POA: Diagnosis not present

## 2018-09-09 DIAGNOSIS — R296 Repeated falls: Secondary | ICD-10-CM | POA: Diagnosis not present

## 2018-09-09 DIAGNOSIS — R2681 Unsteadiness on feet: Secondary | ICD-10-CM | POA: Diagnosis not present

## 2018-09-09 DIAGNOSIS — R2689 Other abnormalities of gait and mobility: Secondary | ICD-10-CM | POA: Diagnosis not present

## 2018-09-09 DIAGNOSIS — C4A8 Merkel cell carcinoma of overlapping sites: Secondary | ICD-10-CM | POA: Diagnosis not present

## 2018-09-09 DIAGNOSIS — E871 Hypo-osmolality and hyponatremia: Secondary | ICD-10-CM | POA: Diagnosis not present

## 2018-09-09 DIAGNOSIS — M6281 Muscle weakness (generalized): Secondary | ICD-10-CM | POA: Diagnosis not present

## 2018-09-09 DIAGNOSIS — R278 Other lack of coordination: Secondary | ICD-10-CM | POA: Diagnosis not present

## 2018-09-10 ENCOUNTER — Ambulatory Visit (INDEPENDENT_AMBULATORY_CARE_PROVIDER_SITE_OTHER): Payer: Medicare HMO | Admitting: *Deleted

## 2018-09-10 DIAGNOSIS — I442 Atrioventricular block, complete: Secondary | ICD-10-CM

## 2018-09-10 DIAGNOSIS — I472 Ventricular tachycardia, unspecified: Secondary | ICD-10-CM

## 2018-09-10 DIAGNOSIS — E871 Hypo-osmolality and hyponatremia: Secondary | ICD-10-CM | POA: Diagnosis not present

## 2018-09-11 DIAGNOSIS — E871 Hypo-osmolality and hyponatremia: Secondary | ICD-10-CM | POA: Diagnosis not present

## 2018-09-11 NOTE — Progress Notes (Signed)
Remote pacemaker transmission.   

## 2018-09-12 DIAGNOSIS — E871 Hypo-osmolality and hyponatremia: Secondary | ICD-10-CM | POA: Diagnosis not present

## 2018-09-13 ENCOUNTER — Ambulatory Visit (HOSPITAL_COMMUNITY)
Admission: RE | Admit: 2018-09-13 | Discharge: 2018-09-13 | Disposition: A | Discharge status: Home / Self Care | Payer: Medicare HMO | Source: Ambulatory Visit | Attending: Otolaryngology | Admitting: Otolaryngology

## 2018-09-13 DIAGNOSIS — E871 Hypo-osmolality and hyponatremia: Secondary | ICD-10-CM | POA: Diagnosis not present

## 2018-09-13 DIAGNOSIS — C4A9 Merkel cell carcinoma, unspecified: Secondary | ICD-10-CM

## 2018-09-13 DIAGNOSIS — C801 Malignant (primary) neoplasm, unspecified: Secondary | ICD-10-CM | POA: Diagnosis not present

## 2018-09-13 LAB — GLUCOSE, CAPILLARY: Glucose-Capillary: 124 mg/dL — ABNORMAL HIGH (ref 70–99)

## 2018-09-13 MED ORDER — FLUDEOXYGLUCOSE F - 18 (FDG) INJECTION
10.3000 | Freq: Once | INTRAVENOUS | Status: AC
  Administered 2018-09-13: 10.3 via INTRAVENOUS

## 2018-09-14 DIAGNOSIS — E871 Hypo-osmolality and hyponatremia: Secondary | ICD-10-CM | POA: Diagnosis not present

## 2018-09-15 ENCOUNTER — Encounter: Payer: Self-pay | Admitting: Cardiology

## 2018-09-15 DIAGNOSIS — E871 Hypo-osmolality and hyponatremia: Secondary | ICD-10-CM | POA: Diagnosis not present

## 2018-09-16 DIAGNOSIS — E871 Hypo-osmolality and hyponatremia: Secondary | ICD-10-CM | POA: Diagnosis not present

## 2018-09-17 DIAGNOSIS — K219 Gastro-esophageal reflux disease without esophagitis: Secondary | ICD-10-CM | POA: Diagnosis not present

## 2018-09-17 DIAGNOSIS — N401 Enlarged prostate with lower urinary tract symptoms: Secondary | ICD-10-CM | POA: Diagnosis not present

## 2018-09-17 DIAGNOSIS — G40909 Epilepsy, unspecified, not intractable, without status epilepticus: Secondary | ICD-10-CM | POA: Diagnosis not present

## 2018-09-17 DIAGNOSIS — I10 Essential (primary) hypertension: Secondary | ICD-10-CM | POA: Diagnosis not present

## 2018-09-17 DIAGNOSIS — E871 Hypo-osmolality and hyponatremia: Secondary | ICD-10-CM | POA: Diagnosis not present

## 2018-09-17 DIAGNOSIS — M1039 Gout due to renal impairment, multiple sites: Secondary | ICD-10-CM | POA: Diagnosis not present

## 2018-09-18 ENCOUNTER — Ambulatory Visit: Payer: Medicare HMO

## 2018-09-18 ENCOUNTER — Ambulatory Visit: Admission: RE | Admit: 2018-09-18 | Payer: Medicare HMO | Source: Ambulatory Visit | Admitting: Radiation Oncology

## 2018-09-18 DIAGNOSIS — E871 Hypo-osmolality and hyponatremia: Secondary | ICD-10-CM | POA: Diagnosis not present

## 2018-09-19 DIAGNOSIS — E871 Hypo-osmolality and hyponatremia: Secondary | ICD-10-CM | POA: Diagnosis not present

## 2018-09-20 ENCOUNTER — Telehealth: Payer: Self-pay | Admitting: *Deleted

## 2018-09-20 DIAGNOSIS — E871 Hypo-osmolality and hyponatremia: Secondary | ICD-10-CM | POA: Diagnosis not present

## 2018-09-20 NOTE — Telephone Encounter (Addendum)
Oncology Nurse [place] where patient resides, spoke with his RN Francisco [name].  Introduced myself as Medical laboratory scientific officer, informed her he has appt with Dr. Isidore Moos, Radiation Oncology, 0830 next Tuesday morning, explained I will be coordinating his future appts as well.  She acknowledged, explained transportation is arranged with [employer], suggested I call her Monday as she is gone for the day.  I provided her my contact information, encouraged her to call me with questions/concerns as Francisco [name] proceeds with tmt at [facility].  Called Mr. Popowski, unable to LVMM.  Francisco Orem, RN, BSN Head & Neck Oncology Nurse [place] at [place] [phone]

## 2018-09-20 NOTE — Progress Notes (Signed)
Head and Neck Cancer Location of Tumor / Histology:  08/21/18 Diagnosis Parotid gland, mass, left - MERKEL CELL CARCINOMA, 3.2 CM, INVOLVING LEFT PAROTID GLAND. SEE NOTE. - CARCINOMA IS LESS THAN 0.1 MILLIMETER FROM INKED SURFACE MARGIN. - NEGATIVE FOR LYMPHOVASCULAR OR PERINEURAL INVASION.  Patient presented with symptoms of: He originally observed the mass 11/18. 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 Blenda Nicely until her recent biopsy on 08/21/18.  Biopsies of left parotid mass revealed: merkel cell carcinoma  Nutrition Status Yes No Comments  Weight changes? []  [x]    Swallowing concerns? []  [x]    PEG? []  [x]     Referrals Yes No Comments  Social Work? []  [x]    Dentistry? [x]  []  Dr. Enrique Sack today  Swallowing therapy? []  [x]    Nutrition? []  [x]    Med/Onc? []  [x]     Safety Issues Yes No Comments  Prior radiation? []  [x]    Pacemaker/ICD? [x]  []    Possible current pregnancy? []  [x]    Is the patient on methotrexate? []  [x]     Tobacco/Marijuana/Snuff/ETOH use: He currently smoking 3 cigarettes daily and using smokeless tobacco. He has not had anything to drink in 8 months.   Past/Anticipated interventions by otolaryngology, if any:  08/21/18 Dr. Blenda Nicely PROCEDURE(S): Left superficial parotidectomy SURGEON: Gavin Pound, MD  Past/Anticipated interventions by medical oncology, if any:  None   Current Complaints / other details:  BP (!) 168/71 (BP Location: Right Arm)   Pulse 83   Temp 97.6 F (36.4 C) (Oral)   Resp (!) 24   Ht 5' 9"  (1.753 m)   Wt 199 lb (90.3 kg)   SpO2 100% Comment: room air  BMI 29.39 kg/m     Wt Readings from Last 3 Encounters:  09/24/18 199 lb (90.3 kg)  08/21/18 192 lb 14.4 oz (87.5 kg)  08/14/18 197 lb 11.2 oz (89.7 kg)

## 2018-09-21 DIAGNOSIS — E871 Hypo-osmolality and hyponatremia: Secondary | ICD-10-CM | POA: Diagnosis not present

## 2018-09-22 DIAGNOSIS — E871 Hypo-osmolality and hyponatremia: Secondary | ICD-10-CM | POA: Diagnosis not present

## 2018-09-23 ENCOUNTER — Telehealth: Payer: Self-pay | Admitting: *Deleted

## 2018-09-23 DIAGNOSIS — E871 Hypo-osmolality and hyponatremia: Secondary | ICD-10-CM | POA: Diagnosis not present

## 2018-09-23 NOTE — Telephone Encounter (Addendum)
Oncology Nurse Navigator Documentation  Spoke with [facility] transportation coordinator Belinda Block re Francisco [name]'s 0830/0900 appts tomorrow morning.  She indicated she will arrive to [facility] by 0815, I noted I will meet them in the lobby.  Spoke with [name], introduced myself, informed him of tomorrow's appts, indicated I would be joining him.  I explained the purpose of a dental evaluation prior to starting RT, indicated he wd be contacted by [facility] to arrange an appt shortly after appt with Dr. Isidore Moos.  He voiced understanding of information provided, expressed appreciation for call.  Gayleen Orem, RN, BSN Head & Neck Oncology Nurse [place] at [place] [phone]

## 2018-09-24 ENCOUNTER — Ambulatory Visit: Payer: Medicare HMO | Admitting: Radiation Oncology

## 2018-09-24 ENCOUNTER — Ambulatory Visit (HOSPITAL_COMMUNITY): Payer: Self-pay | Admitting: Dentistry

## 2018-09-24 ENCOUNTER — Encounter: Payer: Self-pay | Admitting: Radiation Oncology

## 2018-09-24 ENCOUNTER — Encounter (HOSPITAL_COMMUNITY): Payer: Self-pay | Admitting: Dentistry

## 2018-09-24 ENCOUNTER — Encounter: Payer: Self-pay | Admitting: *Deleted

## 2018-09-24 ENCOUNTER — Other Ambulatory Visit: Payer: Self-pay

## 2018-09-24 ENCOUNTER — Ambulatory Visit
Admission: RE | Admit: 2018-09-24 | Discharge: 2018-09-24 | Disposition: A | Discharge status: Home / Self Care | Payer: Medicare HMO | Source: Ambulatory Visit | Attending: Radiation Oncology | Admitting: Radiation Oncology

## 2018-09-24 ENCOUNTER — Ambulatory Visit: Payer: Medicare HMO

## 2018-09-24 VITALS — BP 178/69 | HR 65 | Temp 98.2°F

## 2018-09-24 DIAGNOSIS — C4A9 Merkel cell carcinoma, unspecified: Secondary | ICD-10-CM

## 2018-09-24 DIAGNOSIS — Z01818 Encounter for other preprocedural examination: Secondary | ICD-10-CM

## 2018-09-24 DIAGNOSIS — C07 Malignant neoplasm of parotid gland: Secondary | ICD-10-CM | POA: Diagnosis not present

## 2018-09-24 DIAGNOSIS — M264 Malocclusion, unspecified: Secondary | ICD-10-CM

## 2018-09-24 DIAGNOSIS — C7B1 Secondary Merkel cell carcinoma: Secondary | ICD-10-CM

## 2018-09-24 DIAGNOSIS — F17210 Nicotine dependence, cigarettes, uncomplicated: Secondary | ICD-10-CM | POA: Diagnosis not present

## 2018-09-24 DIAGNOSIS — K08109 Complete loss of teeth, unspecified cause, unspecified class: Secondary | ICD-10-CM

## 2018-09-24 DIAGNOSIS — Z9889 Other specified postprocedural states: Secondary | ICD-10-CM | POA: Diagnosis not present

## 2018-09-24 DIAGNOSIS — K0820 Unspecified atrophy of edentulous alveolar ridge: Secondary | ICD-10-CM

## 2018-09-24 DIAGNOSIS — C4A4 Merkel cell carcinoma of scalp and neck: Secondary | ICD-10-CM | POA: Diagnosis not present

## 2018-09-24 DIAGNOSIS — E871 Hypo-osmolality and hyponatremia: Secondary | ICD-10-CM | POA: Diagnosis not present

## 2018-09-24 DIAGNOSIS — Z972 Presence of dental prosthetic device (complete) (partial): Secondary | ICD-10-CM

## 2018-09-24 DIAGNOSIS — K0889 Other specified disorders of teeth and supporting structures: Secondary | ICD-10-CM

## 2018-09-24 NOTE — Progress Notes (Signed)
DENTAL CONSULTATION  Date of Consultation:  09/24/2018 Patient Name:   Francisco [name] Date of Birth:   [birth year] Medical Record Number: [id number]  VITALS: BP (!) 178/69 (BP Location: Right Arm)   Pulse 65   Temp 98.2 F (36.8 C)   CHIEF COMPLAINT: Patient referred by Dr. Isidore Moos for a dental consultation.  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 Patient is status post left superficial parotidectomy on 08/21/2018 by Dr. Wellington Hampshire. Patient with anticipated postoperative radiation therapy with Dr. Isidore Moos. Patient is now seen as part of a medically necessary preradiation therapy dental protocol examination.  The patient currently indicates that he is edentulous. Patient has an upper and lower complete denture.  Patient has been wearing an upper complete denture since 1982. Patient had his remaining lower teeth extracted with insertion of the lower complete denture in 1987.   This treatment was provided by a dentist in [place].The patient denies significant problems with his dentures at this time. Patient indicates that the lower denture is "a little loose".  The patient denies use of denture adhesive.  Patient, however, is able to eat anything he wants. Patient denies having dental phobia.  PROBLEM LIST: Patient Active Problem List   Diagnosis Date Noted  . Parotid mass 08/21/2018  . Carotid disease, bilateral ([place]) 03/20/2018  . Hyponatremia 12/02/2017  . Falls 12/01/2017  . History of alcohol abuse   . Chest pain 08/14/2016  . Seizure ([place])   . Gout 12/17/2015  . Convulsions ([place]) 08/17/2015  . Gait instability 08/17/2015  . Alcoholism /alcohol abuse ([address]) 08/17/2015  . Cardiac device in situ, other   . Elevated troponin   . Cardiac arrest ([place])   . Complete heart block ([place])   . Seizures ([place])   . Chronic diastolic congestive heart failure ([place])   . HLD (hyperlipidemia)   . Alcohol withdrawal  ([place])   . Acute renal failure syndrome ([place])   . Hypokalemia   . Hypomagnesemia   . Absolute anemia   . Hypertrophic cardiomyopathy ([place])   . Diastolic CHF, acute on chronic (HCC)   . Essential hypertension   . Alcohol withdrawal seizure ([place]) 06/29/2015  . 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 Mild diastolic dysfunction 30/86/5784  . Acute pain of right knee 06/29/2015  . Transaminitis 06/29/2015  . Acute renal failure ([place]) 06/29/2015  . High anion gap metabolic acidosis 69/62/9528  . Acute hypokalemia 06/29/2015  . Alcohol abuse, daily use 06/10/2015  . Hyperglycemia 06/09/2015  . Vitamin D deficiency 06/09/2015  . Hyperlipidemia 06/09/2015  . Bilateral lower extremity edema (chronic) 06/09/2015  . BPH (benign prostatic hypertrophy) 08/13/2014  . Multiple duodenal ulcers 07/03/2012  . Chest pain on exertion 07/02/2012  . GIB (gastrointestinal bleeding) 07/02/2012  . GERD (gastroesophageal reflux disease)   . Heart murmur   . Microcytic anemia 06/14/2012  . Generalized weakness 06/13/2012  . Celiac disease 06/13/2012  . HTN (hypertension) 06/13/2012  . Acute hyponatremia 06/13/2012    PMH: Past Medical History:  Diagnosis Date  . Anemia   . Anginal pain ([place])    normal coronaries 2016  . Celiac disease   . COPD (chronic obstructive pulmonary disease) ([place])   . GERD (gastroesophageal reflux disease)   . Heart murmur   . Hypertension   . Multiple duodenal ulcers   . Presence of permanent cardiac pacemaker   . Right internal carotid occlusion   . Seizures ([place])   .  Shortness of breath   . Symptomatic Bradycardia    a. 07/2015 s/p MDT Advisa L DC PPM (Ser #: [id number]).  . 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 CARDIAC CATHETERIZATION N/A 07/08/2015   Procedure: Left Heart Cath and Coronary Angiography;  Surgeon: Troy Sine, MD;  Location: [facility];  Service: Cardiovascular;  Laterality: N/A;  . CARDIAC CATHETERIZATION N/A 07/08/2015   Procedure: Temporary Pacemaker;  Surgeon: Troy Sine, MD;  Location: [facility];  Service: Cardiovascular;  Laterality: N/A;  . CARDIAC CATHETERIZATION N/A 07/09/2015   Procedure: Temporary Pacemaker;  Surgeon: Troy Sine, MD;  Location: [facility];  Service: Cardiovascular;  Laterality: N/A;  . EP IMPLANTABLE DEVICE N/A 07/09/2015   Procedure: Pacemaker Implant;  Surgeon: Will Meredith Leeds, MD;  Location: [facility];  Service: Cardiovascular;  Laterality: N/A;  . ESOPHAGOGASTRODUODENOSCOPY  07/03/2012   Procedure: ESOPHAGOGASTRODUODENOSCOPY (EGD);  Surgeon: Winfield Cunas., MD;  Location: [facility];  Service: Endoscopy;  Laterality: N/A;  . HEMORRHOID SURGERY    . HERNIA REPAIR    . MOLE REMOVAL    . PAROTIDECTOMY Left 08/21/2018   superficial  . PAROTIDECTOMY Left 08/21/2018   Procedure: LEFT SUPERFICIAL PAROTIDECTOMY;  Surgeon: Helayne Seminole, MD;  Location: [facility] OR;  Service: ENT;  Laterality: Left;    ALLERGIES: No Known Allergies  MEDICATIONS: Current Outpatient Medications  Medication Sig Dispense Refill  . allopurinol (ZYLOPRIM) 300 MG tablet TAKE 1 TABLET(300 MG) BY MOUTH DAILY (Patient taking differently: Take 300 mg by mouth daily. ) 90 tablet 0  . amLODipine (NORVASC) 5 MG tablet TAKE 1 TABLET(5 MG) BY MOUTH DAILY 90 tablet 0  . atorvastatin (LIPITOR) 20 MG tablet Take 1 tablet (20 mg total) by mouth daily. 90 tablet 3  . clotrimazole-betamethasone (LOTRISONE) cream APPLY EXTERNALLY TO THE AFFECTED AREA TWICE DAILY (Patient not taking: Reported on 08/09/2018) 45 g 0  . colchicine 0.6 MG tablet Take 1 tablet (0.6 mg total) by mouth 2 (two) times daily as needed (gout flare). Take twice daily for 2 weeks, then use as needed for gout flare up (Patient not taking: Reported on 09/24/2018) 60 tablet 0  . docusate sodium (COLACE) 100 MG  capsule Take 100 mg by mouth daily.    . ferrous sulfate 325 (65 FE) MG tablet Take 1 tablet (325 mg total) by mouth daily.    . fluticasone (FLONASE) 50 MCG/ACT nasal spray Place 2 sprays into both nostrils daily.    . folic acid (FOLVITE) 1 MG tablet Take 1 tablet (1 mg total) by mouth daily.    . furosemide (LASIX) 20 MG tablet TAKE 1 TABLET BY MOUTH DAILY (Patient not taking: No sig reported) 90 tablet 0  . guaiFENesin (MUCINEX) 600 MG 12 hr tablet Take 600-1,200 mg by mouth 2 (two) times daily as needed for cough.    Marland Kitchen guaiFENesin (ROBITUSSIN) 100 MG/5ML liquid Take 200 mg by mouth every 4 (four) hours as needed for cough.    Marland Kitchen HYDROcodone-acetaminophen (NORCO/VICODIN) 5-325 MG tablet Take 1 tablet by mouth every 4 (four) hours as needed for moderate pain. (Patient not taking: Reported on 09/24/2018) 20 tablet 0  . levETIRAcetam (KEPPRA) 500 MG tablet Take 1,500 mg by mouth daily.    Marland Kitchen lisinopril (PRINIVIL,ZESTRIL) 20 MG tablet TAKE 1 TABLET(20 MG) BY MOUTH DAILY (Patient taking differently: Take 20 mg by mouth daily. )  90 tablet 0  . metoprolol tartrate (LOPRESSOR) 50 MG tablet Take 1.5 tablets (75 mg total) by mouth 2 (two) times daily. 90 tablet 6  . Multiple Vitamin (MULTIVITAMIN WITH MINERALS) TABS tablet Take 1 tablet by mouth daily.    . pantoprazole (PROTONIX) 40 MG tablet TAKE 1 TABLET BY MOUTH TWICE DAILY BEFORE A MEAL (Patient taking differently: Take 40 mg by mouth 2 (two) times daily. ) 180 tablet 0  . polyethylene glycol (MIRALAX / GLYCOLAX) packet Take 17 g by mouth 2 (two) times daily. 14 each 0  . potassium chloride (K-DUR) 10 MEQ tablet TAKE 1 TABLET(10 MEQ) BY MOUTH DAILY (Patient taking differently: Take 10 mEq by mouth daily. ) 90 tablet 0  . tamsulosin (FLOMAX) 0.4 MG CAPS capsule TAKE 1 CAPSULE(0.4 MG) BY MOUTH DAILY (Patient taking differently: Take 0.4 mg by mouth daily. ) 90 capsule 0  . thiamine 100 MG tablet Take 1 tablet (100 mg total) by mouth daily.     No  current facility-administered medications for this visit.     LABS: Lab Results  Component Value Date   WBC 8.7 08/14/2018   HGB 11.1 (L) 08/14/2018   HCT 34.4 (L) 08/14/2018   MCV 89.4 08/14/2018   PLT 351 08/14/2018      Component Value Date/Time   NA 135 08/14/2018 1051   K 4.1 08/14/2018 1051   CL 102 08/14/2018 1051   CO2 25 08/14/2018 1051   GLUCOSE 105 (H) 08/14/2018 1051   BUN 12 08/14/2018 1051   CREATININE 0.99 08/14/2018 1051   CREATININE 1.00 10/04/2017 1513   CALCIUM 9.5 08/14/2018 1051   GFRNONAA >60 08/14/2018 1051   GFRNONAA 75 10/04/2017 1513   GFRAA >60 08/14/2018 1051   GFRAA 87 10/04/2017 1513   Lab Results  Component Value Date   INR 0.92 11/30/2017   INR 1.01 07/08/2015   INR 1.02 07/02/2015   No results found for: PTT  SOCIAL HISTORY: Social History   Socioeconomic History  . Marital status: Single    Spouse name: Not on file  . Number of children: Not on file  . Years of education: Not on file  . Highest education level: Not on file  Occupational History  . Not on file  Social Needs  . Financial resource strain: Not on file  . Food insecurity:    Worry: Not on file    Inability: Not on file  . Transportation needs:    Medical: Yes    Non-medical: Yes  Tobacco Use  . Smoking status: Current Every Day Smoker    Packs/day: 0.25    Years: 50.00    Pack years: 12.50    Types: Cigarettes  . Smokeless tobacco: Current User  . Tobacco comment: 3 cigarettes daily- 09/24/18  Substance and Sexual Activity  . Alcohol use: Not Currently    Comment: He has not had a drink in 8 months  . Drug use: No  . Sexual activity: Never  Lifestyle  . Physical activity:    Days per week: Not on file    Minutes per session: Not on file  . Stress: Not on file  Relationships  . Social connections:    Talks on phone: Not on file    Gets together: Not on file    Attends religious service: Not on file    Active member of club or organization: Not  on file    Attends meetings of clubs or organizations: Not on file  Relationship status: Not on file  . Intimate partner violence:    Fear of current or ex partner: No    Emotionally abused: No    Physically abused: No    Forced sexual activity: No  Other Topics Concern  . Not on file  Social History Narrative   Lives at [facility], [place]    FAMILY HISTORY: Family History  Problem Relation Age of Onset  . Hodgkin's lymphoma Father     REVIEW OF SYSTEMS: Reviewed with the patient as per History of present illness. Psych: Patient denies having dental phobia.  DENTAL HISTORY: CHIEF COMPLAINT: Patient referred by Dr. Isidore Moos for a dental consultation.  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 Patient is status post left superficial parotidectomy on 08/21/2018 by Dr. Wellington Hampshire. Patient with anticipated postoperative radiation therapy with Dr. Isidore Moos. Patient is now seen as part of a medically necessary preradiation therapy dental protocol examination.  The patient currently indicates that he is edentulous. Patient has an upper and lower complete denture.  Patient has been wearing an upper complete denture since 1982. Patient had his remaining lower teeth extracted with insertion of the lower complete denture in 1987.   This treatment was provided by a dentist in [place].The patient denies significant problems with his dentures at this time. Patient indicates that the lower denture is "a little loose".  The patient denies use of denture adhesive.  Patient, however, is able to eat anything he wants. Patient denies having dental phobia.  DENTAL EXAMINATION: GENERAL:  The patient is a well-developed, well-nourished male in no acute distress. Patient walks with the aid of a walker. HEAD AND NECK:  The left neck is consistent with her previous parotid gland surgery. There is no right neck lymphadenopathy. The  patient denies acute TMJ symptoms. INTRAORAL EXAM:  The patient has normal saliva. There is atrophy of the edentulous alveolar ridges.There is no evidence of denture irritation. There are flabby tissues associated with the mandibular anterior alveolar ridge area. DENTITION:  Patient is edentulous.  There are no impacted teeth. There are no retained root segments. PROSTHODONTIC:  The patient has an upper and lower complete denture. The upper complete denture is stable and retentive. Lower complete denture is less than ideal in regards to retention and stability. The patient has worn denture teeth. OCCLUSION:  The occlusion of the upper and lower complete dentures is less than ideal. Patient has an acquired class III malocclusion.  RADIOGRAPHIC INTERPRETATION: An orthopantogram was taken today. The patient is edentulous. There is atrophy of the edentulous alveolar ridges. There is bilateral maxillary pneumatization of the sinuses. There are no apparent retained root segments or impacted teeth.   ASSESSMENTS: 1. 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 Pre-radiation therapy dental protocol 3. Patient is completely edentulous 4. There is atrophy of the edentulous alveolar ridges. 5. Less than ideal upper and lower complete dentures in regards to retention and stability 6. Worn denture teeth 7. Acquired class III malocclusion   PLAN/RECOMMENDATIONS: 1. I discussed the risks, benefits, and complications of various treatment options with the patient in relationship to his medical and dental conditions, anticipated radiation therapy, and radiation therapy side effects to include xerostomia, gum and jawbone changes, and risk for infection and osteoradionecrosis. We discussed various treatment options to include no treatment, implant therapy, and replacement of missing teeth as indicated. The patient currently wishes to defer any dental treatment at this  time. The patient will follow-up with a  dentist of his choice for fabrication of new upper and lower complete dentures no earlier than 3 months after the last radiation therapy has been provided at his discretion.  The patient did not indicate any interested in implant therapy at this time.  The patient is currently cleared for radiation therapy at this time.   2. Discussion of findings with medical team and coordination of future medical and dental care as needed.  I spent in excess of  90 minutes during the conduct of this consultation and >50% of this time involved direct face-to-face encounter for counseling and/or coordination of the patient's care.    Lenn Cal, DDS

## 2018-09-24 NOTE — Patient Instructions (Signed)

## 2018-09-24 NOTE — Progress Notes (Signed)
Radiation Oncology         [phone] ________________________________  Initial Outpatient Consultation  Name: Francisco [name] MRN: [id number]  Date: 09/24/2018  DOB: 04-11-1946  [id number], Lonie Peak, PA-C  Helayne Seminole, MD   REFERRING PHYSICIAN: Helayne Seminole, MD  DIAGNOSIS:    ICD-10-CM   1. 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 male who presented to his PCP in November 2018 with a palpable mass in the left parotid gland region.  Subsequently, the patient saw Dr. Blenda Nicely who ordered a CT scan of the neck performed on 11/15/17. This showed a soft tissue mass of the left parotid tail. Given the location and smoking history, Warthin's tumor was strongly favored. Biopsy of left parotid mass on 01/08/18 did not reveal any evidence of malignancy.   On follow-up with Dr. Blenda Nicely in August, he noted the mass had slightly enlarged and started to become painful. CT of the neck on 07/25/18 confirmed interval growth of the left parotid mass, peripherally enhancing, measuring 19 x 24 x 22 mm.       He subsequently underwent left superficial parotidectomy on 08/21/18. Final pathology revealed: Merkel cell carcinoma, 3.2 cm, involving left parotid gland. Carcinoma is less than 0.1 mm from inked surface margin. Negative for lymphovascular or perineural invasion.   Path confirmed by [facility].  This was not in an obvious node.     Pertinent imaging thus far includes PET scan performed on 09/13/18 revealing increased uptake within the parotid bed, nonspecific.   No hypermetabolic cervical lymph nodes or evidence of distant metastatic  disease. Of note, there is a 4 mm right lung nodule, too small to characterize by PET-CT, but is unchanged from previous imaging.  Swallowing issues, if any: He denies.  Weight Changes: He denies.  Pain status: He reports some pain in his inner ear on the left and some numbness around the parotid surgical site.  Tobacco history, if any: He currently smokes 3 cigarettes daily and uses smokeless tobacco.    ETOH abuse, if any: He has not had a drink in the past 8 months.  Prior cancers, if any: He reports having Mohs surgery to his bottom lip. He does not remember the pathology results. He does report a history of sunburns.  He has a history of stroke and seizures. He has a pacemaker.  On review of systems, the patient is positive for baseline shortness of breath. He reports loose stool (worse than usual). He reports his right leg feels weaker sometimes.  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 He does have transportation issues.  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    PAST SURGICAL HISTORY: Past Surgical History:  Procedure Laterality Date  . CARDIAC CATHETERIZATION N/A 07/08/2015  Procedure: Left Heart Cath and Coronary Angiography;  Surgeon: Troy Sine, MD;  Location: [place] [facility];  Service: Cardiovascular;  Laterality: N/A;  . CARDIAC CATHETERIZATION N/A 07/08/2015   Procedure: Temporary Pacemaker;  Surgeon: Troy Sine, MD;  Location: [place] [facility];  Service: Cardiovascular;  Laterality: N/A;   . CARDIAC CATHETERIZATION N/A 07/09/2015   Procedure: Temporary Pacemaker;  Surgeon: Troy Sine, MD;  Location: [place] [facility];  Service: Cardiovascular;  Laterality: N/A;  . EP IMPLANTABLE DEVICE N/A 07/09/2015   Procedure: Pacemaker Implant;  Surgeon: Will Meredith Leeds, MD;  Location: [place] [facility];  Service: Cardiovascular;  Laterality: N/A;  . ESOPHAGOGASTRODUODENOSCOPY  07/03/2012   Procedure: ESOPHAGOGASTRODUODENOSCOPY (EGD);  Surgeon: Winfield Cunas., MD;  Location[facility] ENDOSCOPY;  Service: Endoscopy;  Laterality: N/A;  . HEMORRHOID SURGERY    . HERNIA REPAIR    . MOLE REMOVAL    . PAROTIDECTOMY Left 08/21/2018   superficial  . PAROTIDECTOMY Left 08/21/2018   Procedure: LEFT SUPERFICIAL PAROTIDECTOMY;  Surgeon: Helayne Seminole, MD;  Location: [place];  Service: ENT;  Laterality: Left;    FAMILY HISTORY: family history includes Hodgkin's lymphoma in his father.  SOCIAL HISTORY:  reports that he has been smoking cigarettes. He has a 12.50 pack-year smoking history. He uses smokeless tobacco. He reports that he drank alcohol. He reports that he does not use drugs.  ALLERGIES: Patient has no known allergies.  MEDICATIONS:  Current Outpatient Medications  Medication Sig Dispense Refill  . allopurinol (ZYLOPRIM) 300 MG tablet TAKE 1 TABLET(300 MG) BY MOUTH DAILY (Patient taking differently: Take 300 mg by mouth daily. ) 90 tablet 0  . amLODipine (NORVASC) 5 MG tablet TAKE 1 TABLET(5 MG) BY MOUTH DAILY 90 tablet 0  . atorvastatin (LIPITOR) 20 MG tablet Take 1 tablet (20 mg total) by mouth daily. 90 tablet 3  . docusate sodium (COLACE) 100 MG capsule Take 100 mg by mouth daily.    . ferrous sulfate 325 (65 FE) MG tablet Take 1 tablet (325 mg total) by mouth daily.    . folic acid (FOLVITE) 1 MG tablet Take 1 tablet (1 mg total) by mouth daily.    Marland Kitchen levETIRAcetam (KEPPRA) 500 MG tablet Take 1,500 mg by mouth daily.    Marland Kitchen lisinopril (PRINIVIL,ZESTRIL) 20 MG tablet TAKE 1  TABLET(20 MG) BY MOUTH DAILY (Patient taking differently: Take 20 mg by mouth daily. ) 90 tablet 0  . Multiple Vitamin (MULTIVITAMIN WITH MINERALS) TABS tablet Take 1 tablet by mouth daily.    . pantoprazole (PROTONIX) 40 MG tablet TAKE 1 TABLET BY MOUTH TWICE DAILY BEFORE A MEAL (Patient taking differently: Take 40 mg by mouth 2 (two) times daily. ) 180 tablet 0  . polyethylene glycol (MIRALAX / GLYCOLAX) packet Take 17 g by mouth 2 (two) times daily. 14 each 0  . potassium chloride (K-DUR) 10 MEQ tablet TAKE 1 TABLET(10 MEQ) BY MOUTH DAILY (Patient taking differently: Take 10 mEq by mouth daily. ) 90 tablet 0  . tamsulosin (FLOMAX) 0.4 MG CAPS capsule TAKE 1 CAPSULE(0.4 MG) BY MOUTH DAILY (Patient taking differently: Take 0.4 mg by mouth daily. ) 90 capsule 0  . thiamine 100 MG tablet Take 1 tablet (100 mg total) by mouth daily.    . clotrimazole-betamethasone (LOTRISONE) cream APPLY EXTERNALLY TO THE AFFECTED AREA TWICE DAILY (Patient not taking: Reported on 08/09/2018) 45 g 0  . colchicine 0.6 MG tablet Take 1 tablet (0.6 mg total) by  mouth 2 (two) times daily as needed (gout flare). Take twice daily for 2 weeks, then use as needed for gout flare up (Patient not taking: Reported on 09/24/2018) 60 tablet 0  . fluticasone (FLONASE) 50 MCG/ACT nasal spray Place 2 sprays into both nostrils daily.    . furosemide (LASIX) 20 MG tablet TAKE 1 TABLET BY MOUTH DAILY (Patient not taking: No sig reported) 90 tablet 0  . guaiFENesin (MUCINEX) 600 MG 12 hr tablet Take 600-1,200 mg by mouth 2 (two) times daily as needed for cough.    Marland Kitchen guaiFENesin (ROBITUSSIN) 100 MG/5ML liquid Take 200 mg by mouth every 4 (four) hours as needed for cough.    Marland Kitchen HYDROcodone-acetaminophen (NORCO/VICODIN) 5-325 MG tablet Take 1 tablet by mouth every 4 (four) hours as needed for moderate pain. (Patient not taking: Reported on 09/24/2018) 20 tablet 0  . metoprolol tartrate (LOPRESSOR) 50 MG tablet Take 1.5 tablets (75 mg total) by  mouth 2 (two) times daily. 90 tablet 6   No current facility-administered medications for this encounter.     REVIEW OF SYSTEMS:  A 10+ POINT REVIEW OF SYSTEMS WAS OBTAINED including neurology, dermatology, psychiatry, cardiac, respiratory, lymph, extremities, GI, GU, Musculoskeletal, constitutional, breasts, reproductive, HEENT.  All pertinent positives are noted in the HPI.  All others are negative.   PHYSICAL EXAM:  height is 5' 9"  (1.753 m) and weight is 199 lb (90.3 kg). His oral temperature is 97.6 F (36.4 C). His blood pressure is 168/71 (abnormal) and his pulse is 83. His respiration is 24 (abnormal) and oxygen saturation is 100%.   General: Alert and oriented, in no acute distress. HEENT: Head is normocephalic. Dentures removed for oral exam. Oral cavity and oropharynx are clear. Neck: The surgical scar over his left neck has healed well. No palpable masses are appreciated in the parotid regions or in the remainder of the neck. Heart: Systolic murmur that radiates through the heart (patient aware). Chest: Clear to auscultation bilaterally, with no rhonchi, wheezes, or rales. Abdomen: Soft, nontender, nondistended, with no rigidity or guarding. Extremities: No cyanosis or edema. Lymphatics: see Neck Exam Skin: No concerning lesions. Musculoskeletal: Symmetric strength and muscle tone throughout. Ambulates with aid of walker. Neurologic: Cranial nerves II through XII are grossly intact. No obvious focalities. Speech is fluent. Coordination is intact. Psychiatric: Judgment and insight are intact. Affect is appropriate.   ECOG = 2  0 - Asymptomatic (Fully active, able to carry on all predisease activities without restriction)  1 - Symptomatic but completely ambulatory (Restricted in physically strenuous activity but ambulatory and able to carry out work of a light or sedentary nature. For example, light housework, office work)  2 - Symptomatic, <50% in bed during the day  (Ambulatory and capable of all self care but unable to carry out any work activities. Up and about more than 50% of waking hours)  3 - Symptomatic, >50% in bed, but not bedbound (Capable of only limited self-care, confined to bed or chair 50% or more of waking hours)  4 - Bedbound (Completely disabled. Cannot carry on any self-care. Totally confined to bed or chair)  5 - Death   Eustace Pen MM, Creech RH, Tormey DC, et al. [phone]). "Toxicity and response criteria of the [facility]". [place] Oncol. 5 (6): 649-55   LABORATORY DATA:  Lab Results  Component Value Date   WBC 8.7 08/14/2018   HGB 11.1 (L) 08/14/2018   HCT 34.4 (L) 08/14/2018   MCV 89.4  08/14/2018   PLT 351 08/14/2018   CMP     Component Value Date/Time   NA 135 08/14/2018 1051   K 4.1 08/14/2018 1051   CL 102 08/14/2018 1051   CO2 25 08/14/2018 1051   GLUCOSE 105 (H) 08/14/2018 1051   BUN 12 08/14/2018 1051   CREATININE 0.99 08/14/2018 1051   CREATININE 1.00 10/04/2017 1513   CALCIUM 9.5 08/14/2018 1051   PROT 6.8 12/01/2017 0528   ALBUMIN 3.4 (L) 12/01/2017 0528   AST 61 (H) 12/01/2017 0528   ALT 27 12/01/2017 0528   ALKPHOS 63 12/01/2017 0528   BILITOT 1.0 12/01/2017 0528   GFRNONAA >60 08/14/2018 1051   GFRNONAA 75 10/04/2017 1513   GFRAA >60 08/14/2018 1051   GFRAA 87 10/04/2017 1513      Lab Results  Component Value Date   TSH 1.372 11/30/2017     RADIOGRAPHY: Nm Pet Image Initial (pi) Skull Base To Thigh  Result Date: 09/13/2018 CLINICAL DATA:  Initial treatment strategy for Merkel cell tumor. EXAM: NUCLEAR MEDICINE PET SKULL BASE TO THIGH TECHNIQUE: 10.3 mCi F-18 FDG was injected intravenously. Full-ring PET imaging was performed from the skull base to thigh after the radiotracer. CT data was obtained and used for attenuation correction and anatomic localization. Fasting blood glucose: 124 mg/dl COMPARISON:  CT cap 11/30/2017, CT soft tissue neck 07/25/2018 FINDINGS:  Mediastinal blood pool activity: SUV max 3.07 NECK: Postoperative changes from left parotidectomy the identified. Increased uptake within the parotid bed is nonspecific in the early postop time frame. SUV max equals 4.7. No hypermetabolic cervical lymph nodes identified. Incidental CT findings: none CHEST: No hypermetabolic supraclavicular or axillary lymph nodes. No hypermetabolic mediastinal or hilar lymph nodes. There is a small nodule within the right upper lobe measuring 4 mm. This is too small to characterize by PET-CT but is unchanged from 11/30/2017. No hypermetabolic pulmonary nodules. Incidental CT findings: There is a left chest wall pacer device with leads in the right atrial appendage and right ventricle. Aortic atherosclerosis. Calcifications in the left main, lad and RCA coronary arteries. Small hiatal hernia. ABDOMEN/PELVIS: No focal liver abnormality. The pancreas appears within normal limits. Spleen negative. The adrenal glands are unremarkable. No hypermetabolic lymph nodes identified within the abdomen. No hypermetabolic pelvic lymph nodes. Incidental CT findings: Aortic atherosclerosis. No aneurysm. Chronic bilateral perinephric fat stranding is again noted and may be related to prior insult. No ascites identified within the abdomen or pelvis. SKELETON: No focal hypermetabolic activity to suggest skeletal metastasis. Incidental CT findings: none IMPRESSION: 1. Increased uptake within the parotid bed is identified and is considered nonspecific in the early postoperative time frame. No hypermetabolic cervical lymph nodes or evidence of distant metastatic disease. 2. 4 mm right lung nodule is too small to characterize by PET-CT but is unchanged from 11/30/2017. Attention on follow-up imaging recommended. 3.  Aortic Atherosclerosis (ICD10-I70.0). 4. Multi vessel coronary artery atherosclerotic calcifications. Electronically Signed   By: Kerby Moors M.D.   On: 09/13/2018 15:41        IMPRESSION/PLAN: pT2, pNX Merkel Cell Carcinoma involving left parotid gland  This is a delightful patient with head and neck cancer. ENT does not recommend more surgery/ nodal dissection. I do recommend adjuvant radiotherapy to reduce risk of locoregional recurrence for this patient. For older patients with other medical issues, I recommend a 4-week course of hypofractionated radiation treatment and I think this is best for him. We discussed that his treatment will be directed to the left parotid  bed and neck.   We discussed the potential risks, benefits, and side effects of radiotherapy. We talked in detail about acute and late effects. We discussed that some of the most bothersome acute effects may be mucositis, dysgeusia, salivary changes, skin irritation, hair loss, dehydration, weight loss, left-sided hearing loss, and fatigue. We talked about late effects which include but are not necessarily limited to dysphagia, hypothyroidism (very rare due to ipsilateral RT), nerve injury, spinal cord injury, xerostomia, trismus, and neck edema. No guarantees of treatment were given. A consent form was signed and placed in the patient's medical record. The patient is enthusiastic about proceeding with treatment. I look forward to participating in the patient's care.    Simulation (treatment planning) will take place on Friday 09/27/18. Plan to begin treatment on October 07, 2018, right after Thanksgiving holiday. We will get in touch with the patient's cardiologist for cardiac clearance before beginning radiotherapy.  We also discussed that the treatment of head and neck cancer is a multidisciplinary process to maximize treatment outcomes and quality of life. For this reasons the following referrals have been or will be made:  1. Dentistry for dental evaluation, possible extractions in the radiation fields, and /or advice on reducing risk of cavities, osteoradionecrosis, or other oral issues. Patient is  scheduled to see Dr. Enrique Sack today.  2. Nutritionist for nutrition support during and after treatment.  3. Social work for social support and transportation issues.  4. Physical therapy due to risk of lymphedema in neck and deconditioning.  - Most recent TSH is WNL.  - I understand that Dr. Blenda Nicely referred him to dermatology for a full skin exam. Gayleen Orem, RN, our Head and Neck Oncology Navigator will check on this.  - I asked the patient today about tobacco use. The patient uses tobacco.  I advised the patient to quit. Services were offered by me today including outpatient counseling and pharmacotherapy. I assessed for the willingness to attempt to quit and provided encouragement and demonstrated willingness to make referrals and/or prescriptions to help the patient attempt to quit. Over 3 minutes were spent on this issue. The patient is not willing to quit at this time. __________________________________________   Eppie Gibson, MD  This document serves as a record of services personally performed by Eppie Gibson, MD. It was created on her behalf by Rae Lips, a trained medical scribe. The creation of this record is based on the scribe's personal observations and the provider's statements to them. This document has been checked and approved by the attending provider.

## 2018-09-25 ENCOUNTER — Encounter: Payer: Self-pay | Admitting: Radiation Oncology

## 2018-09-25 ENCOUNTER — Other Ambulatory Visit: Payer: Self-pay | Admitting: Radiation Oncology

## 2018-09-25 DIAGNOSIS — C07 Malignant neoplasm of parotid gland: Secondary | ICD-10-CM

## 2018-09-25 DIAGNOSIS — C7B1 Secondary Merkel cell carcinoma: Secondary | ICD-10-CM

## 2018-09-25 DIAGNOSIS — Z85828 Personal history of other malignant neoplasm of skin: Secondary | ICD-10-CM | POA: Diagnosis not present

## 2018-09-25 DIAGNOSIS — C4A4 Merkel cell carcinoma of scalp and neck: Secondary | ICD-10-CM | POA: Insufficient documentation

## 2018-09-25 DIAGNOSIS — E871 Hypo-osmolality and hyponatremia: Secondary | ICD-10-CM | POA: Diagnosis not present

## 2018-09-25 DIAGNOSIS — D0471 Carcinoma in situ of skin of right lower limb, including hip: Secondary | ICD-10-CM | POA: Diagnosis not present

## 2018-09-25 DIAGNOSIS — D225 Melanocytic nevi of trunk: Secondary | ICD-10-CM | POA: Diagnosis not present

## 2018-09-25 DIAGNOSIS — D2272 Melanocytic nevi of left lower limb, including hip: Secondary | ICD-10-CM | POA: Diagnosis not present

## 2018-09-25 DIAGNOSIS — L570 Actinic keratosis: Secondary | ICD-10-CM | POA: Diagnosis not present

## 2018-09-25 DIAGNOSIS — L2089 Other atopic dermatitis: Secondary | ICD-10-CM | POA: Diagnosis not present

## 2018-09-25 DIAGNOSIS — L821 Other seborrheic keratosis: Secondary | ICD-10-CM | POA: Diagnosis not present

## 2018-09-25 DIAGNOSIS — D2362 Other benign neoplasm of skin of left upper limb, including shoulder: Secondary | ICD-10-CM | POA: Diagnosis not present

## 2018-09-25 DIAGNOSIS — D2371 Other benign neoplasm of skin of right lower limb, including hip: Secondary | ICD-10-CM | POA: Diagnosis not present

## 2018-09-26 DIAGNOSIS — E871 Hypo-osmolality and hyponatremia: Secondary | ICD-10-CM | POA: Diagnosis not present

## 2018-09-27 ENCOUNTER — Ambulatory Visit
Admission: RE | Admit: 2018-09-27 | Discharge: 2018-09-27 | Disposition: A | Discharge status: Home / Self Care | Payer: Medicare HMO | Source: Ambulatory Visit | Attending: Radiation Oncology | Admitting: Radiation Oncology

## 2018-09-27 ENCOUNTER — Encounter: Payer: Self-pay | Admitting: *Deleted

## 2018-09-27 ENCOUNTER — Telehealth: Payer: Self-pay | Admitting: Radiation Oncology

## 2018-09-27 ENCOUNTER — Encounter: Payer: Self-pay | Admitting: Radiation Oncology

## 2018-09-27 DIAGNOSIS — C07 Malignant neoplasm of parotid gland: Secondary | ICD-10-CM | POA: Diagnosis not present

## 2018-09-27 DIAGNOSIS — E871 Hypo-osmolality and hyponatremia: Secondary | ICD-10-CM | POA: Diagnosis not present

## 2018-09-27 NOTE — Progress Notes (Signed)
Oncology Nurse Navigator Documentation  Met with Mr. Heinrichs following his CT SIM. He reported tolerating procedure w/o difficulty, denied concerns. I toured him to [employer] 3 tmt area, explained registration and arrival procedures. Escorted him to lobby, showed/explained RadOnc kiosk for tmt registration.  He voiced understanding. I encouraged him to call me with questions/concerns.  Gayleen Orem, RN, BSN Head & Neck Oncology Nurse [place] at [place] [phone]

## 2018-09-27 NOTE — Progress Notes (Signed)
Head and Neck Cancer Simulation, IMRT treatment planning note   Outpatient  Diagnosis:    ICD-10-CM   1. Cancer of parotid gland [facility]) C07    The patient was taken to the CT simulator and laid in the supine position on the table. An Aquaplast head and shoulder mask was custom fitted to the patient's anatomy. High-resolution CT axial imaging was obtained of the head and neck with contrast. I verified that the quality of the imaging is good for treatment planning. 1 Medically Necessary Treatment Device was fabricated and supervised by me: Aquaplast mask.  Treatment planning note I plan to treat the patient with IMRT. I plan to treat the patient's tumor bed and left neck nodes. I plan to treat to a total dose of 50 Gray in 20  fractions. Dose calculation was ordered from dosimetry.  IMRT planning Note  IMRT is medically necessary and an important modality to deliver adequate dose to the patient's at risk tissues while sparing the patient's normal structures, including the: esophagus, parotid tissue, mandible, brain stem, spinal cord, oral cavity, brachial plexus.  This justifies the use of IMRT in the patient's treatment.    -----------------------------------  Eppie Gibson, MD

## 2018-09-27 NOTE — Telephone Encounter (Signed)
Spoke with patient last week regarding setting up transportation due to transportation inconsistencies at his facility. Attempted to contact patient today regarding setting up a ride for head and neck clinic next week - patient did not answer and his voicemail is full.

## 2018-09-27 NOTE — Progress Notes (Addendum)
Oncology Nurse Navigator Documentation  Met with Francisco [name] during Consult with DR. Squire to discuss RT for L parotid Merkel cell carcinoma s/p 10/16 L superficial L parotidectomy with ENT [place]. He arrived using walker r/t h/o stroke. He reported:  Living at [facility], transportation not reliable for on-time appt arrivals.  Has full dentures.  Has pacemaker. He voiced understanding of plan for 20 fxt RT. We discussed his attendance at 11/26 [facility] [place] to meet with Nutrition, PT, SW and Financial Counseling.  I provided a tentative 0800 arrival. I escorted him to [place] for pre-radiotherapy evaluation. Navigator Interventions [facility] Transportation Coordinator Ginette Otto to facilitate transportation options.  Gayleen Orem, RN, BSN Head & Neck Oncology Nurse [place] at [place] [phone]

## 2018-09-28 DIAGNOSIS — E871 Hypo-osmolality and hyponatremia: Secondary | ICD-10-CM | POA: Diagnosis not present

## 2018-09-29 DIAGNOSIS — E871 Hypo-osmolality and hyponatremia: Secondary | ICD-10-CM | POA: Diagnosis not present

## 2018-09-30 ENCOUNTER — Telehealth: Payer: Self-pay | Admitting: *Deleted

## 2018-09-30 ENCOUNTER — Telehealth: Payer: Self-pay | Admitting: Radiation Oncology

## 2018-09-30 DIAGNOSIS — E871 Hypo-osmolality and hyponatremia: Secondary | ICD-10-CM | POA: Diagnosis not present

## 2018-09-30 NOTE — Telephone Encounter (Signed)
Oncology Nurse Navigator Documentation  Spoke with Francisco [name] and Restaurant manager, fast food, confirmed his 0800 arrival tomorrow morning for [facility]. I reminded him of registration procedure, he voiced understanding.  Gayleen Orem, RN, BSN Head & Neck Oncology Nurse [place] at [place] [phone]

## 2018-09-30 NOTE — Telephone Encounter (Signed)
Attempted to call patient regarding transportation and was sent to voicemail. Could not leave a voicemail though due to it not being set up. [facility] and confirmed that they have a ride set up for him tomorrow. I am waiting for a return call from their transportation coordinator regarding his future appointments.

## 2018-10-01 ENCOUNTER — Ambulatory Visit
Admission: RE | Admit: 2018-10-01 | Discharge: 2018-10-01 | Disposition: A | Discharge status: Home / Self Care | Payer: Medicare HMO | Source: Ambulatory Visit | Attending: Radiation Oncology | Admitting: Radiation Oncology

## 2018-10-01 ENCOUNTER — Ambulatory Visit: Payer: Medicare HMO | Attending: Radiation Oncology | Admitting: Physical Therapy

## 2018-10-01 ENCOUNTER — Inpatient Hospital Stay: Payer: Medicare HMO | Attending: Radiation Oncology | Admitting: Nutrition

## 2018-10-01 ENCOUNTER — Other Ambulatory Visit: Payer: Self-pay

## 2018-10-01 ENCOUNTER — Encounter: Payer: Self-pay | Admitting: *Deleted

## 2018-10-01 ENCOUNTER — Institutional Professional Consult (permissible substitution): Payer: Medicare HMO | Admitting: Radiation Oncology

## 2018-10-01 ENCOUNTER — Encounter: Payer: Self-pay | Admitting: Physical Therapy

## 2018-10-01 DIAGNOSIS — R293 Abnormal posture: Secondary | ICD-10-CM | POA: Diagnosis not present

## 2018-10-01 DIAGNOSIS — E871 Hypo-osmolality and hyponatremia: Secondary | ICD-10-CM | POA: Diagnosis not present

## 2018-10-01 NOTE — Progress Notes (Signed)
Nutrition Assessment   Reason for Assessment:   Patient in Head and Neck Clinic   ASSESSMENT:  72 year old male with new diagnosis of parotid merkle cell carcinoma stage II.  S/p left parotidectomy on 08/21/18.  Planning radiation treatment (20 fxt) to tumor bed and left neck nodes.  Treatment to start on 10/07/18.  Past medical history of stroke, seizures, pacemaker, slurred speech, smoker, celiac disease, GERD, COPD, HTN  Met with patient during clinic this am.  [facility].  Patient reports all meals are prepared for him at the facility.  Reports food is good some of the time.  Reports that he goes to the dining room for all 3 meals.  Typically breakfast options are eggs, rarely bacon or sausage, hashbrowns, cereal, juice,water, coffee.  Lunch is usually 1 entree (lasagna or meat with vegetables, sometimes sandwiches.  Supper is similar to lunch.  Reports that he does have refrigerator in room and can keep snacks on hand in room.     Medications: lipitor, colace, ferrous sulfate, MVI, protonix, miralax, Kdur, thiamine   Labs: reviewed   Anthropometrics:   Height: 69 inches Weight: 199 lb 6 oz today UBW: 199 lb per patient BMI: 29   Estimated Energy Needs  Kcals: 3212-2482 calories/d Protein: 110-146 g/d Fluid: 2.2 L/d   NUTRITION DIAGNOSIS: Predicted suboptimal energy intake related to cancer related treatments as evidenced by side effects from radiation likely to effect intake.   INTERVENTION:  Discussed the importance of good nutrition during treatment including focusing on good calories and protein. Provided examples of foods high in protein. Contact information provided to patient   MONITORING, EVALUATION, GOAL: Patient will consume adequate calories and protein to maintain weight during treatment   Next Visit: to be determined  Adam Demary B. Zenia Resides, [place], [place] Registered Dietitian [phone] (pager)

## 2018-10-01 NOTE — Therapy (Signed)
[place], [place], [address] Phone: [phone]   Fax:  [phone]  Physical Therapy Evaluation  Patient Details  Name: Francisco [name] MRN: [id number] Date of Birth: 12/10/1945 Referring Provider (PT): Dr. Isidore Moos   Encounter Date: 10/01/2018  PT End of Session - 10/01/18 0923    Visit Number  1    Number of Visits  1    PT Start Time  0850    PT Stop Time  0919    PT Time Calculation (min)  29 min    Activity Tolerance  Patient tolerated treatment well    Behavior During Therapy  [facility] for tasks assessed/performed       Past Medical History:  Diagnosis Date  . Anemia   . Anginal pain ([place])    normal coronaries 2016  . Celiac disease   . COPD (chronic obstructive pulmonary disease) ([place])   . GERD (gastroesophageal reflux disease)   . Heart murmur   . Hypertension   . Multiple duodenal ulcers   . Presence of permanent cardiac pacemaker   . Right internal carotid occlusion   . Seizures ([place])   . Shortness of breath   . Symptomatic Bradycardia    a. 07/2015 s/p MDT Advisa L DC PPM (Ser #: [id number]).  . 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 CARDIAC CATHETERIZATION N/A 07/08/2015   Procedure: Left Heart Cath and Coronary Angiography;  Surgeon: Troy Sine, MD;  Location: [place] [facility];  Service: Cardiovascular;  Laterality: N/A;  . CARDIAC CATHETERIZATION N/A 07/08/2015   Procedure: Temporary Pacemaker;  Surgeon: Troy Sine, MD;  Location: [place] [facility];  Service: Cardiovascular;  Laterality: N/A;  . CARDIAC CATHETERIZATION N/A 07/09/2015   Procedure: Temporary Pacemaker;  Surgeon: Troy Sine, MD;  Location: [place] [facility];  Service: Cardiovascular;  Laterality: N/A;  . EP IMPLANTABLE DEVICE N/A 07/09/2015   Procedure: Pacemaker Implant;  Surgeon: Will Meredith Leeds, MD;  Location: [place] [facility];  Service: Cardiovascular;  Laterality: N/A;  . ESOPHAGOGASTRODUODENOSCOPY  07/03/2012   Procedure: ESOPHAGOGASTRODUODENOSCOPY (EGD);  Surgeon: Winfield Cunas., MD;  Location: [facility]OS[facility];  Service: Endoscopy;  Laterality: N/A;  . HEMORRHOID SURGERY    . HERNIA REPAIR    . MOLE REMOVAL    . PAROTIDECTOMY Left 08/21/2018   superficial  . PAROTIDECTOMY Left 08/21/2018   Procedure: LEFT SUPERFICIAL PAROTIDECTOMY;  Surgeon: Helayne Seminole, MD;  Location: [place];  Service: ENT;  Laterality: Left;    There were no vitals filed for this visit.   Subjective Assessment - 10/01/18 0920    Subjective  I exercise all the time when I am sitting in the chair and laying in bed.    Pertinent History  L parotid Merkel cell carcinoma, Stage II, L parotidectomy 08/21/18, Adjuvant RT 20 fxt to tumor bed and L neck nodes will start 10/07/18, hx of stroke and seizures, has pacemaker, lives in [facility] living at [facility], currently smokes 3 cigs/day, no ETOH past 8 months    Patient Stated Goals  get info from all head and neck clinic providers    Currently in Pain?  No/denies    Pain Score  0-No pain         [facility] PT Assessment - 10/01/18 0001      Assessment   Medical Diagnosis  L parotid Merkel cell carcinoma  Referring Provider (PT)  Dr. Isidore Moos    Onset Date/Surgical Date  08/21/18    Hand Dominance  Right    Prior Therapy  was receiving therapy at assisted living but stopped bc pt states he was able to exercise independently      Precautions   Precautions  ICD/Pacemaker;Other (comment)    Precaution Comments  active cancer, at risk for lymphedema      Restrictions   Weight Bearing Restrictions  No      Balance Screen   Has the patient fallen in the past 6 months  No    Has the patient had a decrease in activity level because of a fear of falling?   No    Is the patient reluctant to leave their home because of a fear of falling?   No      Home Environment    Living Environment  Assisted living    [place] - 4 wheels   rollator     Prior Function   Level of Independence  Independent with household mobility with device    Vocation  Retired    Leisure  pt reports he exercises daily doing bed exercises, chair exercises, walking      Cognition   Overall Cognitive Status  Within Functional Limits for tasks assessed      Observation/Other Assessments   Skin Integrity  intact scar on left with good mobililty      Functional Tests   Functional tests  Sit to Stand      Sit to Stand   Comments  7 reps in 30 sec: which is poor for his age but given his hx of stroke and decreased balance at baseline this is expected. He was able to do this without UE support.       Posture/Postural Control   Posture/Postural Control  Postural limitations    Postural Limitations  Rounded Shoulders;Forward head      ROM / Strength   AROM / PROM / Strength  AROM      AROM   Overall AROM   Within functional limits for tasks performed    AROM Assessment Site  Cervical    Cervical Flexion  WFL    Cervical Extension  WFL    Cervical - Right Side Bend  WFL    Cervical - Left Side Bend  WFL    Cervical - Right Rotation  25% limited    Cervical - Left Rotation  25% limited      Ambulation/Gait   Ambulation/Gait  Yes    Ambulation/Gait Assistance  7: Independent    Ambulation Distance (Feet)  20 Feet    Assistive device  --   used rollator for part of the time   Ambulation Surface  Level    Gait Comments  pt ambulated with rollator down hall then pushed rollator away and ambulated without with a forward lean. He has a previous history of stroke which was over 10 years ago. He reports he is able to walk for long distances with his rollator and his balance improves after he initially gets going. He did turn with his rollator very quickly which was unsafe. Educated pt to turn around slowly.         LYMPHEDEMA/ONCOLOGY QUESTIONNAIRE - 10/01/18 0948       Type   Cancer Type  L parotid Merkel cell carcinoma      Treatment   Active Chemotherapy Treatment  No  Past Chemotherapy Treatment  No    Active Radiation Treatment  No    Past Radiation Treatment  Yes      What other symptoms do you have   Is there Decreased scar mobility  No      Lymphedema Assessments   Lymphedema Assessments  Head and Neck      Head and Neck   4 cm superior to sternal notch around neck  42.1 cm    6 cm superior to sternal notch around neck  42.5 cm    8 cm superior to sternal notch around neck  43.2 cm             Objective measurements completed on examination: See above findings.              PT Education - 10/01/18 0923    Education Details  Neck ROM, posture, breathing, walking, CURE article on staying active, "Why exercise?" flyer, lymphedema and PT info    Person(s) Educated  Patient    Methods  Explanation;Handout;Demonstration    Comprehension  Verbalized understanding;Returned demonstration              Head and Neck Clinic Goals - 10/01/18 0930      Patient will be able to verbalize understanding of a home exercise program for cervical range of motion, posture, and walking.    Status  Achieved      Patient will be able to verbalize understanding of proper sitting and standing posture.    Status  Achieved      Patient will be able to verbalize understanding of lymphedema risk and availability of treatment for this condition.    Status  Achieved         Plan - 10/01/18 0924    Clinical Impression Statement  Pt presents to head and neck clinic following a L parotidectomy on 08/21/18 for treatment of L parotid Merkel cell carcinoma. He will begin radiation on 10/07/18. He has a history of stroke, seizures and a pacemaker. He reports he has had poor balance since the implantation of his pacemaker but reports he has not had any falls recently. He reports his balance improves once he "gets  going." His neck ROM is only slightly limited in direction of rotation to L and R and shoulder ROM is WFL. He reports he lives in assisted living and has PT available there but feels he is able to do his exercises independently. He has no recent falls. Provided education to pt about lymphedema, exercising and ROM exercises. Educated pt to talk to his doctor if he develops any swelling and decreased ROM from radiation to get a referral to outpatient therapy.     History and Personal Factors relevant to plan of care:  pacemaker, seizures, stroke    Clinical Presentation  Evolving    Clinical Presentation due to:  about to start radiation    Clinical Decision Making  Moderate    Rehab Potential  Good    Clinical Impairments Affecting Rehab Potential  will begin radiation    PT Frequency  One time visit    PT Treatment/Interventions  Patient/family education;ADLs/Self Care Home Management    PT Home Exercise Plan  head and neck ex    Consulted and Agree with Plan of Care  Patient       Patient will benefit from skilled therapeutic intervention in order to improve the following deficits and impairments:  Postural dysfunction, Decreased knowledge of precautions  Visit Diagnosis: Abnormal  posture     Problem List Patient Active Problem List   Diagnosis Date Noted  . Cancer of parotid gland ([place]) 09/25/2018  . Merkel cell carcinoma of neck ([place]) 09/25/2018  . Parotid mass 08/21/2018  . Carotid disease, bilateral ([place]) 03/20/2018  . Hyponatremia 12/02/2017  . Falls 12/01/2017  . History of alcohol abuse   . Chest pain 08/14/2016  . Seizure ([place])   . Gout 12/17/2015  . Convulsions ([place]) 08/17/2015  . Gait instability 08/17/2015  . Alcoholism /alcohol abuse ([place]) 08/17/2015  . Cardiac device in situ, other   . Elevated troponin   . Cardiac arrest ([place])   . Complete heart block ([place])   . Seizures ([place])   . Chronic diastolic congestive heart failure ([place])   . HLD (hyperlipidemia)   .  Alcohol withdrawal ([place])   . Acute renal failure syndrome ([place])   . Hypokalemia   . Hypomagnesemia   . Absolute anemia   . Hypertrophic cardiomyopathy ([place])   . Diastolic CHF, acute on chronic (HCC)   . Essential hypertension   . Alcohol withdrawal seizure ([place]) 06/29/2015  . 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 Mild diastolic dysfunction 23/76/2831  . Acute pain of right knee 06/29/2015  . Transaminitis 06/29/2015  . Acute renal failure ([place]) 06/29/2015  . High anion gap metabolic acidosis 51/76/1607  . Acute hypokalemia 06/29/2015  . Alcohol abuse, daily use 06/10/2015  . Hyperglycemia 06/09/2015  . Vitamin D deficiency 06/09/2015  . Hyperlipidemia 06/09/2015  . Bilateral lower extremity edema (chronic) 06/09/2015  . BPH (benign prostatic hypertrophy) 08/13/2014  . Multiple duodenal ulcers 07/03/2012  . Chest pain on exertion 07/02/2012  . GIB (gastrointestinal bleeding) 07/02/2012  . GERD (gastroesophageal reflux disease)   . Heart murmur   . Microcytic anemia 06/14/2012  . Generalized weakness 06/13/2012  . Celiac disease 06/13/2012  . HTN (hypertension) 06/13/2012  . Acute hyponatremia 06/13/2012    Allyson Sabal [facility] 10/01/2018, 9:50 AM  [place], [place], [address] Phone: [phone]   Fax:  [phone]  Name: Francisco [name] MRN: [id number] Date of Birth: 1946-11-02  Manus Gunning, PT 10/01/18 9:50 AM

## 2018-10-02 ENCOUNTER — Telehealth: Payer: Self-pay | Admitting: *Deleted

## 2018-10-02 ENCOUNTER — Telehealth: Payer: Self-pay

## 2018-10-02 DIAGNOSIS — C07 Malignant neoplasm of parotid gland: Secondary | ICD-10-CM | POA: Diagnosis not present

## 2018-10-02 DIAGNOSIS — E871 Hypo-osmolality and hyponatremia: Secondary | ICD-10-CM | POA: Diagnosis not present

## 2018-10-02 NOTE — Telephone Encounter (Signed)
Oncology Nurse Navigator Documentation  Received call from [facility], confirming patient's appts for next week.  She is coordinating transportation.  Gayleen Orem, RN, BSN Head & Neck Oncology Nurse [place] at [place] [phone]

## 2018-10-02 NOTE — Telephone Encounter (Signed)
I called and spoke to Dannial Monarch the [employer]. I informed her that Francisco [name]'s cardiologist has requested that his pacemaker be monitored during his first and last radiation treatment. His first treatment is 10/07/18 at 8:55. She voiced her understanding and reports that she will be here on that date and time.

## 2018-10-03 DIAGNOSIS — E871 Hypo-osmolality and hyponatremia: Secondary | ICD-10-CM | POA: Diagnosis not present

## 2018-10-04 DIAGNOSIS — E871 Hypo-osmolality and hyponatremia: Secondary | ICD-10-CM | POA: Diagnosis not present

## 2018-10-05 DIAGNOSIS — E871 Hypo-osmolality and hyponatremia: Secondary | ICD-10-CM | POA: Diagnosis not present

## 2018-10-05 NOTE — Progress Notes (Signed)
Oncology Nurse Navigator Documentation  Met with [name] upon his arrival for H&N [place].  He was unaccompanied.  Provided verbal and written overview of [place], the clinicians who will be seeing him, encouraged him to ask questions during his time with them.  He was seen by Nutrition, PT, SW and RadOnc Investment banker, operational.  He has not been referred for SLP.  Spoke with him at end of [facility], addressed questions. I encouraged him to call me with needs/concerns.  Gayleen Orem, RN, BSN Head & Neck Oncology [place] at [place] [phone]

## 2018-10-06 DIAGNOSIS — E871 Hypo-osmolality and hyponatremia: Secondary | ICD-10-CM | POA: Diagnosis not present

## 2018-10-07 ENCOUNTER — Telehealth: Payer: Self-pay | Admitting: *Deleted

## 2018-10-07 ENCOUNTER — Encounter: Payer: Self-pay | Admitting: *Deleted

## 2018-10-07 ENCOUNTER — Inpatient Hospital Stay: Payer: Medicare HMO | Admitting: Nutrition

## 2018-10-07 ENCOUNTER — Telehealth: Payer: Self-pay | Admitting: Nutrition

## 2018-10-07 ENCOUNTER — Ambulatory Visit
Admission: RE | Admit: 2018-10-07 | Discharge: 2018-10-07 | Disposition: A | Discharge status: Home / Self Care | Payer: Medicare HMO | Source: Ambulatory Visit | Attending: Radiation Oncology | Admitting: Radiation Oncology

## 2018-10-07 VITALS — BP 147/58 | HR 81 | Temp 98.4°F | Resp 24 | Ht 69.0 in | Wt 201.0 lb

## 2018-10-07 DIAGNOSIS — C07 Malignant neoplasm of parotid gland: Secondary | ICD-10-CM

## 2018-10-07 MED ORDER — SONAFINE EX EMUL
1.0000 "application " | Freq: Once | CUTANEOUS | Status: AC
  Administered 2018-10-07: 1 via TOPICAL

## 2018-10-07 NOTE — Telephone Encounter (Signed)
CALLED PATIENT TO INFORM OF NUTRITION APPT. FOR 10-08-18 @ 11:15 AM WITH BARBARA NEFF, SPOKE WITH HOLDEN HEIGHTS AND INFO. GIVEN TO TOSHA WHO TRANSPORTS PATIENT.

## 2018-10-07 NOTE — Telephone Encounter (Signed)
Noted patient's nutrition appointment was canceled today and rescheduled for tomorrow at 11:15 before radiation treatment.  I contacted patient on his cell phone.  Patient asked me to call  [facility]  to arrange for transportation.  I spoke with Aniceto Boss and informed her that patient has an appointment with nutrition on 11:15.  She reports she will have transportation call me and confirm.  Left my name and contact information.

## 2018-10-07 NOTE — Progress Notes (Signed)
Oncology Nurse Navigator Documentation  To provide support, encouragement and care continuity, met with Mr. Digioia for his initial  RT.  He was unaccompanied.  He received pacemaker check by outside vendor prior to tmt.   He completed treatment without difficulty, denied questions/concerns though he noted he did not sleep well last HS d/t anxiety about today's RT start.  I escorted him to Nursing for Post-Sim education and PUT.  I provided him with an Epic appt calendar, noting Nutrition appt scheduled for this afternoon is being rescheduled in conjunction with a near future RT appt.   I encouraged him to call me with questions/concerns as tmts proceed.  Gayleen Orem, RN, BSN Head & Neck Oncology Nurse [place] at [place] [phone]

## 2018-10-07 NOTE — Progress Notes (Signed)
Pt here for patient teaching.  Pt given Radiation and You booklet, skin care instructions and Sonafine.  Reviewed areas of pertinence such as fatigue, hair loss, mouth changes, skin changes, earaches and taste changes . Pt able to give teach back of to pat skin, use unscented/gentle soap and drink plenty of water,apply Sonafine bid, avoid applying anything to skin within 4 hours of treatment and to use an electric razor if they must shave. Pt verbalizes understanding of information given and will contact nursing with any questions or concerns.     [email or web address]

## 2018-10-08 ENCOUNTER — Inpatient Hospital Stay: Payer: Medicare HMO | Attending: Radiation Oncology | Admitting: Nutrition

## 2018-10-08 ENCOUNTER — Ambulatory Visit
Admission: RE | Admit: 2018-10-08 | Discharge: 2018-10-08 | Disposition: A | Discharge status: Home / Self Care | Payer: Medicare HMO | Source: Ambulatory Visit | Attending: Radiation Oncology | Admitting: Radiation Oncology

## 2018-10-08 DIAGNOSIS — C07 Malignant neoplasm of parotid gland: Secondary | ICD-10-CM | POA: Diagnosis not present

## 2018-10-08 NOTE — Progress Notes (Signed)
Nutrition follow-up completed with patient prior to radiation therapy for parotid Merkel cell carcinoma stage II. Weight increased to 201 pounds on December 2 up from 199 pounds November 26. Patient reports he noticed he was not very hungry for lunch yesterday after his radiation therapy appointment.  He does not know if this is nausea or not.  He reports he ate a little for dinner and he ate a good breakfast this morning. Patient reports that he must eat what is given at the assisted living home and sometimes they do not have good food choices. He is willing to try an oral nutrition supplements.  Nutrition diagnosis: Predicted suboptimal energy intake continues  Intervention: Encourage patient to continue to try to eat well at mealtimes. Provided oral nutrition supplements samples for patient so he can keep them in his refrigerator. Teach back method used.  Monitoring, evaluation, goals: Patient will tolerate adequate calories and protein to minimize weight loss.  Next visit: Monday, December 9 before radiation therapy.  **Disclaimer: This note was dictated with voice recognition software. Similar sounding words can inadvertently be transcribed and this note may contain transcription errors which may not have been corrected upon publication of note.**

## 2018-10-09 ENCOUNTER — Encounter: Payer: Medicare HMO | Admitting: Cardiology

## 2018-10-09 ENCOUNTER — Ambulatory Visit
Admission: RE | Admit: 2018-10-09 | Discharge: 2018-10-09 | Disposition: A | Discharge status: Home / Self Care | Payer: Medicare HMO | Source: Ambulatory Visit | Attending: Radiation Oncology | Admitting: Radiation Oncology

## 2018-10-09 DIAGNOSIS — C07 Malignant neoplasm of parotid gland: Secondary | ICD-10-CM | POA: Diagnosis not present

## 2018-10-10 ENCOUNTER — Ambulatory Visit
Admission: RE | Admit: 2018-10-10 | Discharge: 2018-10-10 | Disposition: A | Discharge status: Home / Self Care | Payer: Medicare HMO | Source: Ambulatory Visit | Attending: Radiation Oncology | Admitting: Radiation Oncology

## 2018-10-10 DIAGNOSIS — C07 Malignant neoplasm of parotid gland: Secondary | ICD-10-CM | POA: Diagnosis not present

## 2018-10-11 ENCOUNTER — Ambulatory Visit
Admission: RE | Admit: 2018-10-11 | Discharge: 2018-10-11 | Disposition: A | Discharge status: Home / Self Care | Payer: Medicare HMO | Source: Ambulatory Visit | Attending: Radiation Oncology | Admitting: Radiation Oncology

## 2018-10-11 ENCOUNTER — Telehealth: Payer: Self-pay | Admitting: *Deleted

## 2018-10-11 DIAGNOSIS — C07 Malignant neoplasm of parotid gland: Secondary | ICD-10-CM | POA: Diagnosis not present

## 2018-10-11 NOTE — Telephone Encounter (Signed)
Oncology Nurse Navigator Documentation  Rec'd call from [place], [facility], confirmed pt's appts for next week.  Gayleen Orem, RN, BSN Head & Neck Oncology Nurse [place] at [place] [phone]

## 2018-10-14 ENCOUNTER — Encounter: Payer: Self-pay | Admitting: Nutrition

## 2018-10-14 ENCOUNTER — Ambulatory Visit
Admission: RE | Admit: 2018-10-14 | Discharge: 2018-10-14 | Disposition: A | Discharge status: Home / Self Care | Payer: Medicare HMO | Source: Ambulatory Visit | Attending: Radiation Oncology | Admitting: Radiation Oncology

## 2018-10-14 ENCOUNTER — Inpatient Hospital Stay: Payer: Medicare HMO | Admitting: Nutrition

## 2018-10-14 DIAGNOSIS — N401 Enlarged prostate with lower urinary tract symptoms: Secondary | ICD-10-CM | POA: Diagnosis not present

## 2018-10-14 DIAGNOSIS — K219 Gastro-esophageal reflux disease without esophagitis: Secondary | ICD-10-CM | POA: Diagnosis not present

## 2018-10-14 DIAGNOSIS — G40909 Epilepsy, unspecified, not intractable, without status epilepticus: Secondary | ICD-10-CM | POA: Diagnosis not present

## 2018-10-14 DIAGNOSIS — E871 Hypo-osmolality and hyponatremia: Secondary | ICD-10-CM | POA: Diagnosis not present

## 2018-10-14 DIAGNOSIS — Z0389 Encounter for observation for other suspected diseases and conditions ruled out: Secondary | ICD-10-CM | POA: Diagnosis not present

## 2018-10-14 DIAGNOSIS — C07 Malignant neoplasm of parotid gland: Secondary | ICD-10-CM | POA: Diagnosis not present

## 2018-10-14 NOTE — Progress Notes (Signed)
Patient did not show up for nutrition appointment. He was given schedule last week.

## 2018-10-15 ENCOUNTER — Ambulatory Visit
Admission: RE | Admit: 2018-10-15 | Discharge: 2018-10-15 | Disposition: A | Discharge status: Home / Self Care | Payer: Medicare HMO | Source: Ambulatory Visit | Attending: Radiation Oncology | Admitting: Radiation Oncology

## 2018-10-15 DIAGNOSIS — Z0389 Encounter for observation for other suspected diseases and conditions ruled out: Secondary | ICD-10-CM | POA: Diagnosis not present

## 2018-10-15 DIAGNOSIS — N401 Enlarged prostate with lower urinary tract symptoms: Secondary | ICD-10-CM | POA: Diagnosis not present

## 2018-10-15 DIAGNOSIS — K219 Gastro-esophageal reflux disease without esophagitis: Secondary | ICD-10-CM | POA: Diagnosis not present

## 2018-10-15 DIAGNOSIS — G40909 Epilepsy, unspecified, not intractable, without status epilepticus: Secondary | ICD-10-CM | POA: Diagnosis not present

## 2018-10-15 DIAGNOSIS — C07 Malignant neoplasm of parotid gland: Secondary | ICD-10-CM | POA: Diagnosis not present

## 2018-10-15 DIAGNOSIS — E871 Hypo-osmolality and hyponatremia: Secondary | ICD-10-CM | POA: Diagnosis not present

## 2018-10-16 ENCOUNTER — Ambulatory Visit
Admission: RE | Admit: 2018-10-16 | Discharge: 2018-10-16 | Disposition: A | Discharge status: Home / Self Care | Payer: Medicare HMO | Source: Ambulatory Visit | Attending: Radiation Oncology | Admitting: Radiation Oncology

## 2018-10-16 DIAGNOSIS — E871 Hypo-osmolality and hyponatremia: Secondary | ICD-10-CM | POA: Diagnosis not present

## 2018-10-16 DIAGNOSIS — C07 Malignant neoplasm of parotid gland: Secondary | ICD-10-CM | POA: Diagnosis not present

## 2018-10-17 ENCOUNTER — Ambulatory Visit
Admission: RE | Admit: 2018-10-17 | Discharge: 2018-10-17 | Disposition: A | Discharge status: Home / Self Care | Payer: Medicare HMO | Source: Ambulatory Visit | Attending: Radiation Oncology | Admitting: Radiation Oncology

## 2018-10-17 DIAGNOSIS — C07 Malignant neoplasm of parotid gland: Secondary | ICD-10-CM | POA: Diagnosis not present

## 2018-10-17 DIAGNOSIS — E871 Hypo-osmolality and hyponatremia: Secondary | ICD-10-CM | POA: Diagnosis not present

## 2018-10-18 ENCOUNTER — Encounter: Payer: Self-pay | Admitting: Cardiology

## 2018-10-18 ENCOUNTER — Ambulatory Visit (INDEPENDENT_AMBULATORY_CARE_PROVIDER_SITE_OTHER): Payer: Medicare HMO | Admitting: Cardiology

## 2018-10-18 ENCOUNTER — Ambulatory Visit
Admission: RE | Admit: 2018-10-18 | Discharge: 2018-10-18 | Disposition: A | Discharge status: Home / Self Care | Payer: Medicare HMO | Source: Ambulatory Visit | Attending: Radiation Oncology | Admitting: Radiation Oncology

## 2018-10-18 VITALS — BP 106/64 | HR 72 | Ht 69.0 in | Wt 196.2 lb

## 2018-10-18 DIAGNOSIS — I442 Atrioventricular block, complete: Secondary | ICD-10-CM | POA: Diagnosis not present

## 2018-10-18 DIAGNOSIS — E871 Hypo-osmolality and hyponatremia: Secondary | ICD-10-CM | POA: Diagnosis not present

## 2018-10-18 DIAGNOSIS — I10 Essential (primary) hypertension: Secondary | ICD-10-CM | POA: Diagnosis not present

## 2018-10-18 DIAGNOSIS — C07 Malignant neoplasm of parotid gland: Secondary | ICD-10-CM | POA: Diagnosis not present

## 2018-10-18 LAB — CUP PACEART INCLINIC DEVICE CHECK
Battery Impedance: 206 Ohm
Battery Remaining Longevity: 123 mo
Battery Voltage: 2.79 V
Brady Statistic AP VP Percent: 43 %
Brady Statistic AP VS Percent: 0 %
Brady Statistic AS VP Percent: 57 %
Brady Statistic AS VS Percent: 0 %
Date Time Interrogation Session: 20191213162050
Implantable Lead Implant Date: 20160902
Implantable Lead Implant Date: 20160902
Implantable Lead Location: 753859
Implantable Lead Location: 753860
Implantable Lead Model: 5076
Implantable Lead Model: 5076
Implantable Pulse Generator Implant Date: 20160902
Lead Channel Impedance Value: 467 Ohm
Lead Channel Impedance Value: 521 Ohm
Lead Channel Pacing Threshold Amplitude: 0.5 V
Lead Channel Pacing Threshold Amplitude: 0.625 V
Lead Channel Pacing Threshold Amplitude: 0.75 V
Lead Channel Pacing Threshold Amplitude: 0.875 V
Lead Channel Pacing Threshold Pulse Width: 0.4 ms
Lead Channel Pacing Threshold Pulse Width: 0.4 ms
Lead Channel Pacing Threshold Pulse Width: 0.4 ms
Lead Channel Pacing Threshold Pulse Width: 0.4 ms
Lead Channel Sensing Intrinsic Amplitude: 4 mV
Lead Channel Setting Pacing Amplitude: 1.5 V
Lead Channel Setting Pacing Amplitude: 2 V
Lead Channel Setting Pacing Pulse Width: 0.4 ms
Lead Channel Setting Sensing Sensitivity: 4 mV

## 2018-10-18 NOTE — Progress Notes (Addendum)
Electrophysiology Office Note   Date:  10/18/2018   ID:  Francisco [name] Jul 02, 1946, MRN [id number]  PCP:  Francisco Hausen, PA  Primary Electrophysiologist:  Francisco Haw, MD    No chief complaint on file.    History of Present Illness: Francisco [name] is a 72 y.o. male who presents today for electrophysiology evaluation.    He presented to the hospital in September for the evaluation of seizures. At that time he was found to be in complete heart block with a ventricular rate in the 30s and altered mental mental status requiring transcutaneous pacing. He had an urgent temporary pacemaker placed which dislodged and ultimately had a dual chamber pacemaker placed.   Today, denies symptoms of palpitations, chest pain, shortness of breath, orthopnea, PND, lower extremity edema, claudication, dizziness, presyncope, syncope, bleeding, or neurologic sequela. The patient is tolerating medications without difficulties.  Overall he is doing well.  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    Past Medical History:  Diagnosis Date  . Anemia   . Anginal pain ([place])    normal coronaries 2016  . Celiac disease   . COPD (chronic obstructive pulmonary disease) ([place])   . GERD (gastroesophageal reflux disease)   . Heart murmur   . Hypertension   . Multiple duodenal ulcers   . Presence of permanent cardiac pacemaker   . Right internal carotid occlusion   . Seizures ([place])   . Shortness of breath   . Symptomatic Bradycardia    a. 07/2015 s/p MDT Advisa L DC PPM (Ser #: [id number]).  . 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 CARDIAC CATHETERIZATION N/A 07/08/2015   Procedure: Left Heart Cath and Coronary Angiography;  Surgeon: Francisco Sine, MD;  Location: [facility];  Service: Cardiovascular;  Laterality: N/A;  . CARDIAC  CATHETERIZATION N/A 07/08/2015   Procedure: Temporary Pacemaker;  Surgeon: Francisco Sine, MD;  Location: [facility];  Service: Cardiovascular;  Laterality: N/A;  . CARDIAC CATHETERIZATION N/A 07/09/2015   Procedure: Temporary Pacemaker;  Surgeon: Francisco Sine, MD;  Location: [facility];  Service: Cardiovascular;  Laterality: N/A;  . EP IMPLANTABLE DEVICE N/A 07/09/2015   Procedure: Pacemaker Implant;  Surgeon: Francisco Wires Meredith Leeds, MD;  Location: [facility];  Service: Cardiovascular;  Laterality: N/A;  . ESOPHAGOGASTRODUODENOSCOPY  07/03/2012   Procedure: ESOPHAGOGASTRODUODENOSCOPY (EGD);  Surgeon: Francisco Cunas., MD;  Location: [facility];  Service: Endoscopy;  Laterality: N/A;  . HEMORRHOID SURGERY    . HERNIA REPAIR    . MOLE REMOVAL    . PAROTIDECTOMY Left 08/21/2018   superficial  . PAROTIDECTOMY Left 08/21/2018   Procedure: LEFT SUPERFICIAL PAROTIDECTOMY;  Surgeon: Francisco Seminole, MD;  Location: [facility];  Service: ENT;  Laterality: Left;     Current Outpatient Medications  Medication Sig Dispense Refill  . allopurinol (ZYLOPRIM) 300 MG tablet TAKE 1 TABLET(300 MG) BY MOUTH DAILY (Patient taking differently: Take 300 mg by mouth daily. ) 90 tablet 0  . amLODipine (NORVASC) 5 MG tablet TAKE 1 TABLET(5 MG) BY MOUTH DAILY 90 tablet 0  . atorvastatin (LIPITOR) 20 MG tablet Take 1 tablet (20 mg total) by mouth daily. 90 tablet 3  . clotrimazole-betamethasone (LOTRISONE) cream APPLY EXTERNALLY TO THE AFFECTED AREA TWICE DAILY 45 g 0  . colchicine 0.6 MG tablet Take  1 tablet (0.6 mg total) by mouth 2 (two) times daily as needed (gout flare). Take twice daily for 2 weeks, then use as needed for gout flare up 60 tablet 0  . docusate sodium (COLACE) 100 MG capsule Take 100 mg by mouth daily.    . ferrous sulfate 325 (65 FE) MG tablet Take 1 tablet (325 mg total) by mouth daily.    . fluticasone (FLONASE) 50 MCG/ACT nasal spray Place 2 sprays into both nostrils  daily.    . folic acid (FOLVITE) 1 MG tablet Take 1 tablet (1 mg total) by mouth daily.    . furosemide (LASIX) 20 MG tablet TAKE 1 TABLET BY MOUTH DAILY 90 tablet 0  . guaiFENesin (MUCINEX) 600 MG 12 hr tablet Take 600-1,200 mg by mouth 2 (two) times daily as needed for cough.    Marland Kitchen guaiFENesin (ROBITUSSIN) 100 MG/5ML liquid Take 200 mg by mouth every 4 (four) hours as needed for cough.    Marland Kitchen HYDROcodone-acetaminophen (NORCO/VICODIN) 5-325 MG tablet Take 1 tablet by mouth every 4 (four) hours as needed for moderate pain. 20 tablet 0  . levETIRAcetam (KEPPRA) 500 MG tablet Take 1,500 mg by mouth daily.    Marland Kitchen lisinopril (PRINIVIL,ZESTRIL) 20 MG tablet TAKE 1 TABLET(20 MG) BY MOUTH DAILY (Patient taking differently: Take 20 mg by mouth daily. ) 90 tablet 0  . Multiple Vitamin (MULTIVITAMIN WITH MINERALS) TABS tablet Take 1 tablet by mouth daily.    . pantoprazole (PROTONIX) 40 MG tablet TAKE 1 TABLET BY MOUTH TWICE DAILY BEFORE A MEAL (Patient taking differently: Take 40 mg by mouth 2 (two) times daily. ) 180 tablet 0  . polyethylene glycol (MIRALAX / GLYCOLAX) packet Take 17 g by mouth 2 (two) times daily. 14 each 0  . potassium chloride (K-DUR) 10 MEQ tablet TAKE 1 TABLET(10 MEQ) BY MOUTH DAILY (Patient taking differently: Take 10 mEq by mouth daily. ) 90 tablet 0  . tamsulosin (FLOMAX) 0.4 MG CAPS capsule TAKE 1 CAPSULE(0.4 MG) BY MOUTH DAILY (Patient taking differently: Take 0.4 mg by mouth daily. ) 90 capsule 0  . thiamine 100 MG tablet Take 1 tablet (100 mg total) by mouth daily.    . metoprolol tartrate (LOPRESSOR) 50 MG tablet Take 1.5 tablets (75 mg total) by mouth 2 (two) times daily. 90 tablet 6   No current facility-administered medications for this visit.     Allergies:   Patient has no known allergies.   Social History:  The patient  reports that he has been smoking cigarettes. He has a 12.50 pack-year smoking history. He uses smokeless tobacco. He reports previous alcohol use. He  reports that he does not use drugs.   Family History:  The patient's family history includes Hodgkin's lymphoma in his father.   ROS:  Please see the history of present illness.   Otherwise, review of systems is positive for appetite change, fatigue.   All other systems are reviewed and negative.   PHYSICAL EXAM: VS:  BP 106/64   Pulse 72   Ht 5' 9"  (1.753 m)   Wt 196 lb 3.2 oz (89 kg)   SpO2 98%   BMI 28.97 kg/m  , BMI Body mass index is 28.97 kg/m. GEN: Well nourished, well developed, in no acute distress  HEENT: normal  Neck: no JVD, carotid bruits, or masses Cardiac: RRR; no murmurs, rubs, or gallops,no edema  Respiratory:  clear to auscultation bilaterally, normal work of breathing GI: soft, nontender, nondistended, + BS MS: no  deformity or atrophy  Skin: warm and dry, device site well healed Neuro:  Strength and sensation are intact Psych: euthymic mood, full affect  EKG:  EKG is ordered today. Personal review of the ekg ordered shows atrial sensed, ventricular paced, rate 72  Personal review of the device interrogation today. Results in [place]: 11/30/2017: TSH 1.372 12/01/2017: ALT 27; Magnesium 1.7 08/14/2018: BUN 12; Creatinine, Ser 0.99; Hemoglobin 11.1; Platelets 351; Potassium 4.1; Sodium 135    Lipid Panel     Component Value Date/Time   CHOL 154 02/22/2017 0944   TRIG 67 02/22/2017 0944   HDL 69 02/22/2017 0944   CHOLHDL 2.2 02/22/2017 0944   VLDL 13 02/22/2017 0944   LDLCALC 72 02/22/2017 0944     Wt Readings from Last 3 Encounters:  10/18/18 196 lb 3.2 oz (89 kg)  10/07/18 201 lb (91.2 kg)  10/01/18 199 lb 6 oz (90.4 kg)      Other studies Reviewed: Additional studies/ records that were reviewed today include: 07/08/15 Cath Review of the above records today demonstrates:  Complete heart block with ventricular rates in the low 30s requiring insertion of a transvenous pacemaker.  Normal coronary arteries.  Hyperdynamic LV function  with an ejection fraction of a aproximately 65%.    ASSESSMENT AND PLAN:  1.  Complete heart block: This post Medtronic dual-chamber pacemaker.  Functioning appropriately.  No changes.  2. Hypertension: Currently well controlled.  No changes.  3. Ventricular high rate episodes: None seen after increase of metoprolol.  No changes.  Current medicines are reviewed at length with the patient today.   The patient does not have concerns regarding his medicines.  The following changes were made today: None  Labs/ tests ordered today include:  Orders Placed This Encounter  Procedures  . EKG 12-Lead     Disposition:   FU with Roy Snuffer 12 months  Signed, Tationna Fullard Meredith Leeds, MD  10/18/2018 4:10 PM     [address] [place] [address] [phone] (office) [phone] (fax)

## 2018-10-19 DIAGNOSIS — E871 Hypo-osmolality and hyponatremia: Secondary | ICD-10-CM | POA: Diagnosis not present

## 2018-10-20 DIAGNOSIS — E871 Hypo-osmolality and hyponatremia: Secondary | ICD-10-CM | POA: Diagnosis not present

## 2018-10-21 ENCOUNTER — Ambulatory Visit
Admission: RE | Admit: 2018-10-21 | Discharge: 2018-10-21 | Disposition: A | Discharge status: Home / Self Care | Payer: Medicare HMO | Source: Ambulatory Visit | Attending: Radiation Oncology | Admitting: Radiation Oncology

## 2018-10-21 ENCOUNTER — Other Ambulatory Visit: Payer: Self-pay | Admitting: Radiation Oncology

## 2018-10-21 ENCOUNTER — Inpatient Hospital Stay: Payer: Medicare HMO | Admitting: Nutrition

## 2018-10-21 DIAGNOSIS — E871 Hypo-osmolality and hyponatremia: Secondary | ICD-10-CM | POA: Diagnosis not present

## 2018-10-21 DIAGNOSIS — C07 Malignant neoplasm of parotid gland: Secondary | ICD-10-CM | POA: Diagnosis not present

## 2018-10-21 MED ORDER — NYSTATIN 100000 UNIT/ML MT SUSP: 5.0000 mL | Freq: Four times a day (QID) | OROMUCOSAL | 0 refills | Status: DC

## 2018-10-21 MED ORDER — LIDOCAINE VISCOUS HCL 2 % MT SOLN: OROMUCOSAL | 3 refills | Status: AC

## 2018-10-21 NOTE — Progress Notes (Signed)
Nutrition follow-up completed with patient before radiation therapy for parotid Merkel cell carcinoma stage II. Weight decreased and documented as 197 pounds today down from 201 pounds December 2. Patient reports he has very painful swallowing and was unable to swallow several pills this morning. He is tolerating small amounts of soft pured type foods. He is tolerating 2 bottles of water and can drink Ensure Enlive without difficulty but states that it is painful. He is complaining of thick mucus.  Nutrition diagnosis: Predicted suboptimal energy intake has evolved into inadequate oral intake secondary to parotid Merkel cell carcinoma as evidenced by patient's self-report of inability to eat and 4 pound weight loss over 2 weeks.  Intervention: Recommended patient consume 4-5 bottles of Ensure Enlive daily.   Provided complementary case. Educated patient on strategies for rinsing mouth with baking soda and salt water rinses.  Provided recipe and asked him to show his caregivers so they can prepare for him. Recommended he consume pured type consistencies and continue with increased water intake. Encouraged him to talk to his physician about pain and difficulty swallowing. Teach back method used.  Monitoring, evaluation, goals: Patient will tolerate increased calories and protein to minimize further weight loss.  Next visit: Tuesday, December 24 after radiation therapy.  **Disclaimer: This note was dictated with voice recognition software. Similar sounding words can inadvertently be transcribed and this note may contain transcription errors which may not have been corrected upon publication of note.**

## 2018-10-22 ENCOUNTER — Ambulatory Visit
Admission: RE | Admit: 2018-10-22 | Discharge: 2018-10-22 | Disposition: A | Discharge status: Home / Self Care | Payer: Medicare HMO | Source: Ambulatory Visit | Attending: Radiation Oncology | Admitting: Radiation Oncology

## 2018-10-22 DIAGNOSIS — C07 Malignant neoplasm of parotid gland: Secondary | ICD-10-CM | POA: Diagnosis not present

## 2018-10-22 DIAGNOSIS — E871 Hypo-osmolality and hyponatremia: Secondary | ICD-10-CM | POA: Diagnosis not present

## 2018-10-23 ENCOUNTER — Ambulatory Visit
Admission: RE | Admit: 2018-10-23 | Discharge: 2018-10-23 | Disposition: A | Discharge status: Home / Self Care | Payer: Medicare HMO | Source: Ambulatory Visit | Attending: Radiation Oncology | Admitting: Radiation Oncology

## 2018-10-23 DIAGNOSIS — E871 Hypo-osmolality and hyponatremia: Secondary | ICD-10-CM | POA: Diagnosis not present

## 2018-10-23 DIAGNOSIS — C07 Malignant neoplasm of parotid gland: Secondary | ICD-10-CM | POA: Diagnosis not present

## 2018-10-24 ENCOUNTER — Ambulatory Visit
Admission: RE | Admit: 2018-10-24 | Discharge: 2018-10-24 | Disposition: A | Discharge status: Home / Self Care | Payer: Medicare HMO | Source: Ambulatory Visit | Attending: Radiation Oncology | Admitting: Radiation Oncology

## 2018-10-24 DIAGNOSIS — C07 Malignant neoplasm of parotid gland: Secondary | ICD-10-CM | POA: Diagnosis not present

## 2018-10-24 DIAGNOSIS — E871 Hypo-osmolality and hyponatremia: Secondary | ICD-10-CM | POA: Diagnosis not present

## 2018-10-25 ENCOUNTER — Ambulatory Visit
Admission: RE | Admit: 2018-10-25 | Discharge: 2018-10-25 | Disposition: A | Discharge status: Home / Self Care | Payer: Medicare HMO | Source: Ambulatory Visit | Attending: Radiation Oncology | Admitting: Radiation Oncology

## 2018-10-25 DIAGNOSIS — C07 Malignant neoplasm of parotid gland: Secondary | ICD-10-CM | POA: Diagnosis not present

## 2018-10-25 DIAGNOSIS — E871 Hypo-osmolality and hyponatremia: Secondary | ICD-10-CM | POA: Diagnosis not present

## 2018-10-26 DIAGNOSIS — E871 Hypo-osmolality and hyponatremia: Secondary | ICD-10-CM | POA: Diagnosis not present

## 2018-10-27 DIAGNOSIS — E871 Hypo-osmolality and hyponatremia: Secondary | ICD-10-CM | POA: Diagnosis not present

## 2018-10-28 ENCOUNTER — Ambulatory Visit
Admission: RE | Admit: 2018-10-28 | Discharge: 2018-10-28 | Disposition: A | Discharge status: Home / Self Care | Payer: Medicare HMO | Source: Ambulatory Visit | Attending: Radiation Oncology | Admitting: Radiation Oncology

## 2018-10-28 ENCOUNTER — Telehealth: Payer: Self-pay

## 2018-10-28 DIAGNOSIS — E871 Hypo-osmolality and hyponatremia: Secondary | ICD-10-CM | POA: Diagnosis not present

## 2018-10-28 DIAGNOSIS — C07 Malignant neoplasm of parotid gland: Secondary | ICD-10-CM | POA: Diagnosis not present

## 2018-10-29 ENCOUNTER — Ambulatory Visit
Admission: RE | Admit: 2018-10-29 | Discharge: 2018-10-29 | Disposition: A | Discharge status: Home / Self Care | Payer: Medicare HMO | Source: Ambulatory Visit | Attending: Radiation Oncology | Admitting: Radiation Oncology

## 2018-10-29 ENCOUNTER — Encounter: Payer: Self-pay | Admitting: Nutrition

## 2018-10-29 ENCOUNTER — Inpatient Hospital Stay: Payer: Medicare HMO | Admitting: Nutrition

## 2018-10-29 DIAGNOSIS — C07 Malignant neoplasm of parotid gland: Secondary | ICD-10-CM | POA: Diagnosis not present

## 2018-10-29 DIAGNOSIS — E871 Hypo-osmolality and hyponatremia: Secondary | ICD-10-CM | POA: Diagnosis not present

## 2018-10-29 NOTE — Progress Notes (Signed)
Patient did not show up for nutrition follow-up.

## 2018-10-30 DIAGNOSIS — E871 Hypo-osmolality and hyponatremia: Secondary | ICD-10-CM | POA: Diagnosis not present

## 2018-10-31 ENCOUNTER — Ambulatory Visit
Admission: RE | Admit: 2018-10-31 | Discharge: 2018-10-31 | Disposition: A | Discharge status: Home / Self Care | Payer: Medicare HMO | Source: Ambulatory Visit | Attending: Radiation Oncology | Admitting: Radiation Oncology

## 2018-10-31 DIAGNOSIS — C07 Malignant neoplasm of parotid gland: Secondary | ICD-10-CM | POA: Diagnosis not present

## 2018-10-31 DIAGNOSIS — E871 Hypo-osmolality and hyponatremia: Secondary | ICD-10-CM | POA: Diagnosis not present

## 2018-10-31 NOTE — Telephone Encounter (Signed)
Spoke with Stanton Kidney at [facility]. Advised that pt has an upcoming appoint on 11/15/18 @11am  with Dr Isidore Moos

## 2018-11-01 ENCOUNTER — Ambulatory Visit
Admission: RE | Admit: 2018-11-01 | Discharge: 2018-11-01 | Disposition: A | Discharge status: Home / Self Care | Payer: Medicare HMO | Source: Ambulatory Visit | Attending: Radiation Oncology | Admitting: Radiation Oncology

## 2018-11-01 DIAGNOSIS — C07 Malignant neoplasm of parotid gland: Secondary | ICD-10-CM | POA: Diagnosis not present

## 2018-11-01 DIAGNOSIS — E871 Hypo-osmolality and hyponatremia: Secondary | ICD-10-CM | POA: Diagnosis not present

## 2018-11-02 DIAGNOSIS — E871 Hypo-osmolality and hyponatremia: Secondary | ICD-10-CM | POA: Diagnosis not present

## 2018-11-03 DIAGNOSIS — E871 Hypo-osmolality and hyponatremia: Secondary | ICD-10-CM | POA: Diagnosis not present

## 2018-11-04 ENCOUNTER — Ambulatory Visit
Admission: RE | Admit: 2018-11-04 | Discharge: 2018-11-04 | Disposition: A | Discharge status: Home / Self Care | Payer: Medicare HMO | Source: Ambulatory Visit | Attending: Radiation Oncology | Admitting: Radiation Oncology

## 2018-11-04 ENCOUNTER — Inpatient Hospital Stay: Payer: Medicare HMO | Admitting: Nutrition

## 2018-11-04 ENCOUNTER — Encounter: Payer: Self-pay | Admitting: *Deleted

## 2018-11-04 DIAGNOSIS — C07 Malignant neoplasm of parotid gland: Secondary | ICD-10-CM

## 2018-11-04 DIAGNOSIS — E871 Hypo-osmolality and hyponatremia: Secondary | ICD-10-CM | POA: Diagnosis not present

## 2018-11-04 MED ORDER — SONAFINE EX EMUL
1.0000 "application " | Freq: Once | CUTANEOUS | Status: AC
  Administered 2018-11-04: 1 via TOPICAL

## 2018-11-04 NOTE — Progress Notes (Signed)
Oncology Nurse Navigator Documentation  Met with Francisco [name] during final RT to offer support and to celebrate end of radiation treatment.  He was unaccompanied. Provided verbal/written post-RT guidance:  Importance of keeping all follow-up appts, especially those with Nutrition and SLP.  Importance of protecting treatment area from sun.  Continuation of Sonafine application 2-3 times daily until supply exhausted after which transition to OTC lotion with vitamin E. Provided him Epic appt calendar, noted in particular 11/15/18 11:00 post-tmt follow-up with Dr. Isidore Moos. Explained my role as navigator will continue for several more months and that I will be calling and/or joining him during follow-up visits.   I encouraged him to call me with needs/concerns.   He verbalized understanding of information provided. Escorted him to appt with Nutritionist.  Gayleen Orem, RN, [place] [place] [phone]

## 2018-11-04 NOTE — Progress Notes (Signed)
Mr. Riner presents for follow up of radiation completed 11/04/18 to his head and neck. (EOT note not in at time of this note).   Pain issues, if any: He denies.  Using a feeding tube?: No Weight changes, if any:  Wt Readings from Last 3 Encounters:  11/15/18 193 lb 3.2 oz (87.6 kg)  10/21/18 197 lb (89.4 kg)  10/18/18 196 lb 3.2 oz (89 kg)   Swallowing issues, if any: He denies difficulty swallowing. Smoking or chewing tobacco? No Using fluoride trays daily? N/A Last ENT visit was on: N/A Other notable issues, if any:  He reports feeling like there is a scab in his left ear. He tells me that his hearing is the same as before radiation He reports eyes that get tears at times, and a runny left nostril  His skin has healed well. There is dry skin and redness present to his radiation site. He continues to apply sonafine to this area twice daily.   BP 138/67 (BP Location: Right Arm, Patient Position: Sitting)   Pulse 64   Temp 98.2 F (36.8 C) (Oral)   Resp (!) 24   Ht 5' 9"  (1.753 m)   Wt 193 lb 3.2 oz (87.6 kg)   SpO2 98%   BMI 28.53 kg/m

## 2018-11-04 NOTE — Progress Notes (Signed)
Nutrition follow-up completed with patient after his final radiation therapy for parotid Merkel cell carcinoma stage II. Patient reports current weight is about 191 pounds. This is approximately a 10 pound weight loss since beginning of treatment. He is drinking 2 Ensure Enlive daily.  He is not eating much food.  He states it does not taste good to him. He vocalizes no other complaints.  Nutrition diagnosis: Inadequate oral intake continues.  Intervention: Educated patient to increase soft foods at mealtimes to include puddings, soups, yogurt, scrambled eggs, and hot cereal. Recommended patient increase Ensure Enlive 4 times daily. Provided additional complementary case of Ensure Enlive.  Monitoring, evaluation, goals: Patient will work to increase oral intake to minimize further weight loss and promote healing.  No follow-up has been scheduled.  Patient agrees to contact me for questions or concerns.  **Disclaimer: This note was dictated with voice recognition software. Similar sounding words can inadvertently be transcribed and this note may contain transcription errors which may not have been corrected upon publication of note.**

## 2018-11-05 DIAGNOSIS — E871 Hypo-osmolality and hyponatremia: Secondary | ICD-10-CM | POA: Diagnosis not present

## 2018-11-06 DIAGNOSIS — E871 Hypo-osmolality and hyponatremia: Secondary | ICD-10-CM | POA: Diagnosis not present

## 2018-11-07 DIAGNOSIS — E871 Hypo-osmolality and hyponatremia: Secondary | ICD-10-CM | POA: Diagnosis not present

## 2018-11-08 DIAGNOSIS — E871 Hypo-osmolality and hyponatremia: Secondary | ICD-10-CM | POA: Diagnosis not present

## 2018-11-09 DIAGNOSIS — E871 Hypo-osmolality and hyponatremia: Secondary | ICD-10-CM | POA: Diagnosis not present

## 2018-11-10 DIAGNOSIS — E871 Hypo-osmolality and hyponatremia: Secondary | ICD-10-CM | POA: Diagnosis not present

## 2018-11-10 LAB — CUP PACEART REMOTE DEVICE CHECK
Battery Impedance: 182 Ohm
Battery Remaining Longevity: 127 mo
Battery Voltage: 2.79 V
Brady Statistic AP VP Percent: 48 %
Brady Statistic AP VS Percent: 0 %
Brady Statistic AS VP Percent: 52 %
Brady Statistic AS VS Percent: 0 %
Date Time Interrogation Session: 20191105150026
Implantable Lead Implant Date: 20160902
Implantable Lead Implant Date: 20160902
Implantable Lead Location: 753859
Implantable Lead Location: 753860
Implantable Lead Model: 5076
Implantable Lead Model: 5076
Implantable Pulse Generator Implant Date: 20160902
Lead Channel Impedance Value: 459 Ohm
Lead Channel Impedance Value: 499 Ohm
Lead Channel Pacing Threshold Amplitude: 0.625 V
Lead Channel Pacing Threshold Amplitude: 0.875 V
Lead Channel Pacing Threshold Pulse Width: 0.4 ms
Lead Channel Pacing Threshold Pulse Width: 0.4 ms
Lead Channel Setting Pacing Amplitude: 1.5 V
Lead Channel Setting Pacing Amplitude: 2 V
Lead Channel Setting Pacing Pulse Width: 0.4 ms
Lead Channel Setting Sensing Sensitivity: 4 mV

## 2018-11-11 DIAGNOSIS — M1A39X0 Chronic gout due to renal impairment, multiple sites, without tophus (tophi): Secondary | ICD-10-CM | POA: Diagnosis not present

## 2018-11-11 DIAGNOSIS — I110 Hypertensive heart disease with heart failure: Secondary | ICD-10-CM | POA: Diagnosis not present

## 2018-11-11 DIAGNOSIS — C07 Malignant neoplasm of parotid gland: Secondary | ICD-10-CM | POA: Diagnosis not present

## 2018-11-11 DIAGNOSIS — K900 Celiac disease: Secondary | ICD-10-CM | POA: Diagnosis not present

## 2018-11-11 DIAGNOSIS — E871 Hypo-osmolality and hyponatremia: Secondary | ICD-10-CM | POA: Diagnosis not present

## 2018-11-11 DIAGNOSIS — I428 Other cardiomyopathies: Secondary | ICD-10-CM | POA: Diagnosis not present

## 2018-11-11 DIAGNOSIS — G40909 Epilepsy, unspecified, not intractable, without status epilepticus: Secondary | ICD-10-CM | POA: Diagnosis not present

## 2018-11-11 DIAGNOSIS — I5030 Unspecified diastolic (congestive) heart failure: Secondary | ICD-10-CM | POA: Diagnosis not present

## 2018-11-11 DIAGNOSIS — I2510 Atherosclerotic heart disease of native coronary artery without angina pectoris: Secondary | ICD-10-CM | POA: Diagnosis not present

## 2018-11-11 DIAGNOSIS — N401 Enlarged prostate with lower urinary tract symptoms: Secondary | ICD-10-CM | POA: Diagnosis not present

## 2018-11-12 ENCOUNTER — Encounter: Payer: Self-pay | Admitting: Radiation Oncology

## 2018-11-12 DIAGNOSIS — K900 Celiac disease: Secondary | ICD-10-CM | POA: Diagnosis not present

## 2018-11-12 DIAGNOSIS — I428 Other cardiomyopathies: Secondary | ICD-10-CM | POA: Diagnosis not present

## 2018-11-12 DIAGNOSIS — I110 Hypertensive heart disease with heart failure: Secondary | ICD-10-CM | POA: Diagnosis not present

## 2018-11-12 DIAGNOSIS — N401 Enlarged prostate with lower urinary tract symptoms: Secondary | ICD-10-CM | POA: Diagnosis not present

## 2018-11-12 DIAGNOSIS — I2510 Atherosclerotic heart disease of native coronary artery without angina pectoris: Secondary | ICD-10-CM | POA: Diagnosis not present

## 2018-11-12 DIAGNOSIS — E871 Hypo-osmolality and hyponatremia: Secondary | ICD-10-CM | POA: Diagnosis not present

## 2018-11-12 DIAGNOSIS — K219 Gastro-esophageal reflux disease without esophagitis: Secondary | ICD-10-CM | POA: Diagnosis not present

## 2018-11-12 DIAGNOSIS — M1039 Gout due to renal impairment, multiple sites: Secondary | ICD-10-CM | POA: Diagnosis not present

## 2018-11-12 DIAGNOSIS — I5030 Unspecified diastolic (congestive) heart failure: Secondary | ICD-10-CM | POA: Diagnosis not present

## 2018-11-12 DIAGNOSIS — C07 Malignant neoplasm of parotid gland: Secondary | ICD-10-CM | POA: Diagnosis not present

## 2018-11-12 DIAGNOSIS — G40909 Epilepsy, unspecified, not intractable, without status epilepticus: Secondary | ICD-10-CM | POA: Diagnosis not present

## 2018-11-12 DIAGNOSIS — M1A39X0 Chronic gout due to renal impairment, multiple sites, without tophus (tophi): Secondary | ICD-10-CM | POA: Diagnosis not present

## 2018-11-12 NOTE — Progress Notes (Signed)
  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 He experienced thrush over his uvula and buccal mucosa, as well as thick saliva. Thrush treated with good effect. Erythema and dry peeling were noted to the radiation field. Towards the end of treatments, he reported fatigue, and mouth pain somewhat relieved by lidocaine rinses.  Plan: The patient has completed radiation treatment. The patient will return to radiation oncology clinic for routine followup within one month. I advised them to call or return sooner if they have any questions or concerns related to their recovery or treatment.  -----------------------------------  Eppie Gibson, MD  This document serves as a record of services personally performed by Eppie Gibson, MD. It was created on her behalf by Wilburn Mylar, a trained medical scribe. The creation of this record is based on the scribe's personal observations and the provider's statements to them. This document has been checked and approved by the attending provider.

## 2018-11-13 DIAGNOSIS — N401 Enlarged prostate with lower urinary tract symptoms: Secondary | ICD-10-CM | POA: Diagnosis not present

## 2018-11-13 DIAGNOSIS — E871 Hypo-osmolality and hyponatremia: Secondary | ICD-10-CM | POA: Diagnosis not present

## 2018-11-13 DIAGNOSIS — M1A39X0 Chronic gout due to renal impairment, multiple sites, without tophus (tophi): Secondary | ICD-10-CM | POA: Diagnosis not present

## 2018-11-13 DIAGNOSIS — I110 Hypertensive heart disease with heart failure: Secondary | ICD-10-CM | POA: Diagnosis not present

## 2018-11-13 DIAGNOSIS — G40909 Epilepsy, unspecified, not intractable, without status epilepticus: Secondary | ICD-10-CM | POA: Diagnosis not present

## 2018-11-13 DIAGNOSIS — I5030 Unspecified diastolic (congestive) heart failure: Secondary | ICD-10-CM | POA: Diagnosis not present

## 2018-11-13 DIAGNOSIS — I428 Other cardiomyopathies: Secondary | ICD-10-CM | POA: Diagnosis not present

## 2018-11-13 DIAGNOSIS — K900 Celiac disease: Secondary | ICD-10-CM | POA: Diagnosis not present

## 2018-11-13 DIAGNOSIS — I2510 Atherosclerotic heart disease of native coronary artery without angina pectoris: Secondary | ICD-10-CM | POA: Diagnosis not present

## 2018-11-13 DIAGNOSIS — C07 Malignant neoplasm of parotid gland: Secondary | ICD-10-CM | POA: Diagnosis not present

## 2018-11-14 DIAGNOSIS — E871 Hypo-osmolality and hyponatremia: Secondary | ICD-10-CM | POA: Diagnosis not present

## 2018-11-15 ENCOUNTER — Encounter: Payer: Self-pay | Admitting: Radiation Oncology

## 2018-11-15 ENCOUNTER — Ambulatory Visit
Admission: RE | Admit: 2018-11-15 | Discharge: 2018-11-15 | Disposition: A | Discharge status: Home / Self Care | Payer: Medicare HMO | Source: Ambulatory Visit | Attending: Radiation Oncology | Admitting: Radiation Oncology

## 2018-11-15 ENCOUNTER — Other Ambulatory Visit: Payer: Self-pay

## 2018-11-15 VITALS — BP 138/67 | HR 64 | Temp 98.2°F | Resp 24 | Ht 69.0 in | Wt 193.2 lb

## 2018-11-15 DIAGNOSIS — C4A4 Merkel cell carcinoma of scalp and neck: Secondary | ICD-10-CM

## 2018-11-15 DIAGNOSIS — Z923 Personal history of irradiation: Secondary | ICD-10-CM | POA: Insufficient documentation

## 2018-11-15 DIAGNOSIS — I428 Other cardiomyopathies: Secondary | ICD-10-CM | POA: Diagnosis not present

## 2018-11-15 DIAGNOSIS — I5030 Unspecified diastolic (congestive) heart failure: Secondary | ICD-10-CM | POA: Diagnosis not present

## 2018-11-15 DIAGNOSIS — G40909 Epilepsy, unspecified, not intractable, without status epilepticus: Secondary | ICD-10-CM | POA: Diagnosis not present

## 2018-11-15 DIAGNOSIS — C07 Malignant neoplasm of parotid gland: Secondary | ICD-10-CM | POA: Diagnosis not present

## 2018-11-15 DIAGNOSIS — N401 Enlarged prostate with lower urinary tract symptoms: Secondary | ICD-10-CM | POA: Diagnosis not present

## 2018-11-15 DIAGNOSIS — I2510 Atherosclerotic heart disease of native coronary artery without angina pectoris: Secondary | ICD-10-CM | POA: Diagnosis not present

## 2018-11-15 DIAGNOSIS — K900 Celiac disease: Secondary | ICD-10-CM | POA: Diagnosis not present

## 2018-11-15 DIAGNOSIS — M1A39X0 Chronic gout due to renal impairment, multiple sites, without tophus (tophi): Secondary | ICD-10-CM | POA: Diagnosis not present

## 2018-11-15 DIAGNOSIS — Z79899 Other long term (current) drug therapy: Secondary | ICD-10-CM | POA: Insufficient documentation

## 2018-11-15 DIAGNOSIS — I110 Hypertensive heart disease with heart failure: Secondary | ICD-10-CM | POA: Diagnosis not present

## 2018-11-15 DIAGNOSIS — E871 Hypo-osmolality and hyponatremia: Secondary | ICD-10-CM | POA: Diagnosis not present

## 2018-11-15 HISTORY — DX: Personal history of irradiation: Z92.3

## 2018-11-15 MED ORDER — NEOMYCIN-COLIST-HC-THONZONIUM 3.3-3-10-0.5 MG/ML OT SUSP: 3.0000 [drp] | Freq: Four times a day (QID) | OTIC | 0 refills | Status: DC

## 2018-11-15 NOTE — Progress Notes (Signed)
Radiation Oncology         [phone] ________________________________  Name: Francisco [name] MRN: [id number]  Date: 11/15/2018  DOB: 11-Dec-1945  Follow-Up Visit Note  CC: Lesia Hausen, PA  Helayne Seminole, MD  Diagnosis and Prior Radiotherapy:       ICD-10-CM   1. Merkel cell carcinoma of neck ([place]) C4A.4 neomycin-colistin-hydrocortisone-thonzonium (CORTISPORIN-TC) 3.01-06-09-0.5 MG/ML OTIC suspension  2. Cancer of parotid gland ([place]) C07    Merkel Cell Carcinoma involving left parotid gland    CHIEF COMPLAINT:  Here for follow-up and surveillance of left parotid cancer  Radiation treatment dates:   10/07/18 - 11/04/18  Site/dose:   Parotid, Left / 50 Gy in 20 fractions of 2.5 Gy  Narrative:  The patient returns today for routine follow-up. He is able to taste his foods now and has been doing well. The worse thing that he notes is that he has increased tearing to his bilateral eyes, right > left, rhinorrhea, and left ear sensation of having something in it - hearing intact. He denies any other symptoms. He doesn't have a follow up with his ENT specialist as of yet.    ALLERGIES:  has No Known Allergies.  Meds: Current Outpatient Medications  Medication Sig Dispense Refill  . allopurinol (ZYLOPRIM) 300 MG tablet TAKE 1 TABLET(300 MG) BY MOUTH DAILY (Patient taking differently: Take 300 mg by mouth daily. ) 90 tablet 0  . amLODipine (NORVASC) 5 MG tablet TAKE 1 TABLET(5 MG) BY MOUTH DAILY 90 tablet 0  . atorvastatin (LIPITOR) 20 MG tablet Take 1 tablet (20 mg total) by mouth daily. 90 tablet 3  . clotrimazole-betamethasone (LOTRISONE) cream APPLY EXTERNALLY TO THE AFFECTED AREA TWICE DAILY 45 g 0  . colchicine 0.6 MG tablet Take 1 tablet (0.6 mg total) by mouth 2 (two) times daily as needed (gout flare). Take twice daily for 2 weeks, then use as needed for gout flare up 60 tablet 0  . docusate sodium (COLACE) 100 MG capsule Take 100 mg by mouth daily.    . ferrous sulfate 325  (65 FE) MG tablet Take 1 tablet (325 mg total) by mouth daily.    . fluticasone (FLONASE) 50 MCG/ACT nasal spray Place 2 sprays into both nostrils daily.    . folic acid (FOLVITE) 1 MG tablet Take 1 tablet (1 mg total) by mouth daily.    . furosemide (LASIX) 20 MG tablet TAKE 1 TABLET BY MOUTH DAILY 90 tablet 0  . guaiFENesin (MUCINEX) 600 MG 12 hr tablet Take 600-1,200 mg by mouth 2 (two) times daily as needed for cough.    Marland Kitchen guaiFENesin (ROBITUSSIN) 100 MG/5ML liquid Take 200 mg by mouth every 4 (four) hours as needed for cough.    Marland Kitchen HYDROcodone-acetaminophen (NORCO/VICODIN) 5-325 MG tablet Take 1 tablet by mouth every 4 (four) hours as needed for moderate pain. 20 tablet 0  . levETIRAcetam (KEPPRA) 500 MG tablet Take 1,500 mg by mouth daily.    Marland Kitchen lidocaine (XYLOCAINE) 2 % solution Patient: Mix 1part 2% viscous lidocaine, 1part H20. Swish & swallow 107m of diluted mixture, 265m before meals and at bedtime, up to QID 200 mL 3  . lisinopril (PRINIVIL,ZESTRIL) 20 MG tablet TAKE 1 TABLET(20 MG) BY MOUTH DAILY (Patient taking differently: Take 20 mg by mouth daily. ) 90 tablet 0  . Multiple Vitamin (MULTIVITAMIN WITH MINERALS) TABS tablet Take 1 tablet by mouth daily.    . Marland Kitchenystatin (MYCOSTATIN) 100000 UNIT/ML suspension Take 5 mLs (500,000 Units total)  by mouth 4 (four) times daily. Swish and gargle in mouth for a minute, then swallow. This should help thrush in mouth. Use QID until bottle is empty. 473 mL 0  . pantoprazole (PROTONIX) 40 MG tablet TAKE 1 TABLET BY MOUTH TWICE DAILY BEFORE A MEAL (Patient taking differently: Take 40 mg by mouth 2 (two) times daily. ) 180 tablet 0  . polyethylene glycol (MIRALAX / GLYCOLAX) packet Take 17 g by mouth 2 (two) times daily. 14 each 0  . potassium chloride (K-DUR) 10 MEQ tablet TAKE 1 TABLET(10 MEQ) BY MOUTH DAILY (Patient taking differently: Take 10 mEq by mouth daily. ) 90 tablet 0  . tamsulosin (FLOMAX) 0.4 MG CAPS capsule TAKE 1 CAPSULE(0.4 MG) BY MOUTH  DAILY (Patient taking differently: Take 0.4 mg by mouth daily. ) 90 capsule 0  . thiamine 100 MG tablet Take 1 tablet (100 mg total) by mouth daily.    . metoprolol tartrate (LOPRESSOR) 50 MG tablet Take 1.5 tablets (75 mg total) by mouth 2 (two) times daily. 90 tablet 6  . neomycin-colistin-hydrocortisone-thonzonium (CORTISPORIN-TC) 3.01-06-09-0.5 MG/ML OTIC suspension Place 3 drops into the left ear 4 (four) times daily. 10 mL 0   No current facility-administered medications for this encounter.     Physical Findings: The patient is in no acute distress. Patient is alert and oriented. Wt Readings from Last 3 Encounters:  11/15/18 193 lb 3.2 oz (87.6 kg)  10/21/18 197 lb (89.4 kg)  10/18/18 196 lb 3.2 oz (89 kg)    height is 5' 9"  (1.753 m) and weight is 193 lb 3.2 oz (87.6 kg). His oral temperature is 98.2 F (36.8 C). His blood pressure is 138/67 and his pulse is 64. His respiration is 24 (abnormal) and oxygen saturation is 98%. .  General: Alert and oriented, in no acute distress HEENT: Head is normocephalic. Extraocular movements are intact. Oropharynx is notable for oral cavity clear.  Whitish thick fluid filling the left ear canal.  Neck: Neck is notable for without any any palpable adenopathy Skin: Skin in treatment fields shows skin is still erythematous and dry in RT fields.  Muculoskeletal Using a walker.  Lymphatics: see Neck Exam Psychiatric: Judgment and insight are intact. Affect is appropriate.   Lab Findings: Lab Results  Component Value Date   WBC 8.7 08/14/2018   HGB 11.1 (L) 08/14/2018   HCT 34.4 (L) 08/14/2018   MCV 89.4 08/14/2018   PLT 351 08/14/2018    Lab Results  Component Value Date   TSH 1.372 11/30/2017    Radiographic Findings: No results found.  Impression/Plan:   1) Head and Neck Cancer Status: healing well from radiotherapy  2) Nutritional Status: No active issues.   3) Thyroid function: unlikely to be affected by ipsilateral  radiotherapy.  Lab Results  Component Value Date   TSH 1.372 11/30/2017    4) Other: Recommended artificial tears to help with overproduction of tears in eyes. Will prescribe cortisporin ear drops for left ear canal, to use QID fpr 2-3 weeks. Continue to apply vitamin E lotion or the cream that we provided to his neck.    5) Follow-up in 3 months with restaging imaging. The patient was encouraged to call with any issues or questions before then.       Eppie Gibson, MD  This document serves as a record of services personally performed by Eppie Gibson, MD. It was created on her behalf by Steva Colder, a trained medical scribe. The creation of this record  is based on the scribe's personal observations and the provider's statements to them. This document has been checked and approved by the attending provider.

## 2018-11-16 DIAGNOSIS — E871 Hypo-osmolality and hyponatremia: Secondary | ICD-10-CM | POA: Diagnosis not present

## 2018-11-17 DIAGNOSIS — E871 Hypo-osmolality and hyponatremia: Secondary | ICD-10-CM | POA: Diagnosis not present

## 2018-11-18 DIAGNOSIS — G40909 Epilepsy, unspecified, not intractable, without status epilepticus: Secondary | ICD-10-CM | POA: Diagnosis not present

## 2018-11-18 DIAGNOSIS — C07 Malignant neoplasm of parotid gland: Secondary | ICD-10-CM | POA: Diagnosis not present

## 2018-11-18 DIAGNOSIS — N401 Enlarged prostate with lower urinary tract symptoms: Secondary | ICD-10-CM | POA: Diagnosis not present

## 2018-11-18 DIAGNOSIS — I2510 Atherosclerotic heart disease of native coronary artery without angina pectoris: Secondary | ICD-10-CM | POA: Diagnosis not present

## 2018-11-18 DIAGNOSIS — I110 Hypertensive heart disease with heart failure: Secondary | ICD-10-CM | POA: Diagnosis not present

## 2018-11-18 DIAGNOSIS — K900 Celiac disease: Secondary | ICD-10-CM | POA: Diagnosis not present

## 2018-11-18 DIAGNOSIS — I428 Other cardiomyopathies: Secondary | ICD-10-CM | POA: Diagnosis not present

## 2018-11-18 DIAGNOSIS — M1A39X0 Chronic gout due to renal impairment, multiple sites, without tophus (tophi): Secondary | ICD-10-CM | POA: Diagnosis not present

## 2018-11-18 DIAGNOSIS — I5030 Unspecified diastolic (congestive) heart failure: Secondary | ICD-10-CM | POA: Diagnosis not present

## 2018-11-18 DIAGNOSIS — E871 Hypo-osmolality and hyponatremia: Secondary | ICD-10-CM | POA: Diagnosis not present

## 2018-11-19 DIAGNOSIS — E871 Hypo-osmolality and hyponatremia: Secondary | ICD-10-CM | POA: Diagnosis not present

## 2018-11-20 DIAGNOSIS — M1A39X0 Chronic gout due to renal impairment, multiple sites, without tophus (tophi): Secondary | ICD-10-CM | POA: Diagnosis not present

## 2018-11-20 DIAGNOSIS — I5030 Unspecified diastolic (congestive) heart failure: Secondary | ICD-10-CM | POA: Diagnosis not present

## 2018-11-20 DIAGNOSIS — E871 Hypo-osmolality and hyponatremia: Secondary | ICD-10-CM | POA: Diagnosis not present

## 2018-11-20 DIAGNOSIS — K900 Celiac disease: Secondary | ICD-10-CM | POA: Diagnosis not present

## 2018-11-20 DIAGNOSIS — I110 Hypertensive heart disease with heart failure: Secondary | ICD-10-CM | POA: Diagnosis not present

## 2018-11-20 DIAGNOSIS — I428 Other cardiomyopathies: Secondary | ICD-10-CM | POA: Diagnosis not present

## 2018-11-20 DIAGNOSIS — I2510 Atherosclerotic heart disease of native coronary artery without angina pectoris: Secondary | ICD-10-CM | POA: Diagnosis not present

## 2018-11-20 DIAGNOSIS — N401 Enlarged prostate with lower urinary tract symptoms: Secondary | ICD-10-CM | POA: Diagnosis not present

## 2018-11-20 DIAGNOSIS — C07 Malignant neoplasm of parotid gland: Secondary | ICD-10-CM | POA: Diagnosis not present

## 2018-11-20 DIAGNOSIS — G40909 Epilepsy, unspecified, not intractable, without status epilepticus: Secondary | ICD-10-CM | POA: Diagnosis not present

## 2018-11-21 DIAGNOSIS — E871 Hypo-osmolality and hyponatremia: Secondary | ICD-10-CM | POA: Diagnosis not present

## 2018-11-22 DIAGNOSIS — E871 Hypo-osmolality and hyponatremia: Secondary | ICD-10-CM | POA: Diagnosis not present

## 2018-11-23 DIAGNOSIS — E871 Hypo-osmolality and hyponatremia: Secondary | ICD-10-CM | POA: Diagnosis not present

## 2018-11-24 DIAGNOSIS — E871 Hypo-osmolality and hyponatremia: Secondary | ICD-10-CM | POA: Diagnosis not present

## 2018-11-25 DIAGNOSIS — I110 Hypertensive heart disease with heart failure: Secondary | ICD-10-CM | POA: Diagnosis not present

## 2018-11-25 DIAGNOSIS — K900 Celiac disease: Secondary | ICD-10-CM | POA: Diagnosis not present

## 2018-11-25 DIAGNOSIS — M1A39X0 Chronic gout due to renal impairment, multiple sites, without tophus (tophi): Secondary | ICD-10-CM | POA: Diagnosis not present

## 2018-11-25 DIAGNOSIS — I5030 Unspecified diastolic (congestive) heart failure: Secondary | ICD-10-CM | POA: Diagnosis not present

## 2018-11-25 DIAGNOSIS — I2510 Atherosclerotic heart disease of native coronary artery without angina pectoris: Secondary | ICD-10-CM | POA: Diagnosis not present

## 2018-11-25 DIAGNOSIS — I428 Other cardiomyopathies: Secondary | ICD-10-CM | POA: Diagnosis not present

## 2018-11-25 DIAGNOSIS — N401 Enlarged prostate with lower urinary tract symptoms: Secondary | ICD-10-CM | POA: Diagnosis not present

## 2018-11-25 DIAGNOSIS — C07 Malignant neoplasm of parotid gland: Secondary | ICD-10-CM | POA: Diagnosis not present

## 2018-11-25 DIAGNOSIS — G40909 Epilepsy, unspecified, not intractable, without status epilepticus: Secondary | ICD-10-CM | POA: Diagnosis not present

## 2018-11-27 DIAGNOSIS — C07 Malignant neoplasm of parotid gland: Secondary | ICD-10-CM | POA: Diagnosis not present

## 2018-11-27 DIAGNOSIS — I5030 Unspecified diastolic (congestive) heart failure: Secondary | ICD-10-CM | POA: Diagnosis not present

## 2018-11-27 DIAGNOSIS — M1A39X0 Chronic gout due to renal impairment, multiple sites, without tophus (tophi): Secondary | ICD-10-CM | POA: Diagnosis not present

## 2018-11-27 DIAGNOSIS — I110 Hypertensive heart disease with heart failure: Secondary | ICD-10-CM | POA: Diagnosis not present

## 2018-11-27 DIAGNOSIS — I428 Other cardiomyopathies: Secondary | ICD-10-CM | POA: Diagnosis not present

## 2018-11-27 DIAGNOSIS — G40909 Epilepsy, unspecified, not intractable, without status epilepticus: Secondary | ICD-10-CM | POA: Diagnosis not present

## 2018-11-27 DIAGNOSIS — I2510 Atherosclerotic heart disease of native coronary artery without angina pectoris: Secondary | ICD-10-CM | POA: Diagnosis not present

## 2018-11-27 DIAGNOSIS — N401 Enlarged prostate with lower urinary tract symptoms: Secondary | ICD-10-CM | POA: Diagnosis not present

## 2018-11-27 DIAGNOSIS — K900 Celiac disease: Secondary | ICD-10-CM | POA: Diagnosis not present

## 2018-11-28 DIAGNOSIS — K900 Celiac disease: Secondary | ICD-10-CM | POA: Diagnosis not present

## 2018-11-28 DIAGNOSIS — I2510 Atherosclerotic heart disease of native coronary artery without angina pectoris: Secondary | ICD-10-CM | POA: Diagnosis not present

## 2018-11-28 DIAGNOSIS — I110 Hypertensive heart disease with heart failure: Secondary | ICD-10-CM | POA: Diagnosis not present

## 2018-11-28 DIAGNOSIS — C07 Malignant neoplasm of parotid gland: Secondary | ICD-10-CM | POA: Diagnosis not present

## 2018-11-28 DIAGNOSIS — I5030 Unspecified diastolic (congestive) heart failure: Secondary | ICD-10-CM | POA: Diagnosis not present

## 2018-11-28 DIAGNOSIS — G40909 Epilepsy, unspecified, not intractable, without status epilepticus: Secondary | ICD-10-CM | POA: Diagnosis not present

## 2018-11-28 DIAGNOSIS — N401 Enlarged prostate with lower urinary tract symptoms: Secondary | ICD-10-CM | POA: Diagnosis not present

## 2018-11-28 DIAGNOSIS — M1A39X0 Chronic gout due to renal impairment, multiple sites, without tophus (tophi): Secondary | ICD-10-CM | POA: Diagnosis not present

## 2018-11-28 DIAGNOSIS — I428 Other cardiomyopathies: Secondary | ICD-10-CM | POA: Diagnosis not present

## 2018-12-02 DIAGNOSIS — C07 Malignant neoplasm of parotid gland: Secondary | ICD-10-CM | POA: Diagnosis not present

## 2018-12-02 DIAGNOSIS — I110 Hypertensive heart disease with heart failure: Secondary | ICD-10-CM | POA: Diagnosis not present

## 2018-12-02 DIAGNOSIS — I5030 Unspecified diastolic (congestive) heart failure: Secondary | ICD-10-CM | POA: Diagnosis not present

## 2018-12-02 DIAGNOSIS — K900 Celiac disease: Secondary | ICD-10-CM | POA: Diagnosis not present

## 2018-12-02 DIAGNOSIS — G40909 Epilepsy, unspecified, not intractable, without status epilepticus: Secondary | ICD-10-CM | POA: Diagnosis not present

## 2018-12-02 DIAGNOSIS — M1A39X0 Chronic gout due to renal impairment, multiple sites, without tophus (tophi): Secondary | ICD-10-CM | POA: Diagnosis not present

## 2018-12-02 DIAGNOSIS — I2510 Atherosclerotic heart disease of native coronary artery without angina pectoris: Secondary | ICD-10-CM | POA: Diagnosis not present

## 2018-12-02 DIAGNOSIS — E871 Hypo-osmolality and hyponatremia: Secondary | ICD-10-CM | POA: Diagnosis not present

## 2018-12-02 DIAGNOSIS — I428 Other cardiomyopathies: Secondary | ICD-10-CM | POA: Diagnosis not present

## 2018-12-02 DIAGNOSIS — N401 Enlarged prostate with lower urinary tract symptoms: Secondary | ICD-10-CM | POA: Diagnosis not present

## 2018-12-03 DIAGNOSIS — E871 Hypo-osmolality and hyponatremia: Secondary | ICD-10-CM | POA: Diagnosis not present

## 2018-12-04 DIAGNOSIS — E871 Hypo-osmolality and hyponatremia: Secondary | ICD-10-CM | POA: Diagnosis not present

## 2018-12-05 DIAGNOSIS — C07 Malignant neoplasm of parotid gland: Secondary | ICD-10-CM | POA: Diagnosis not present

## 2018-12-05 DIAGNOSIS — M1A39X0 Chronic gout due to renal impairment, multiple sites, without tophus (tophi): Secondary | ICD-10-CM | POA: Diagnosis not present

## 2018-12-05 DIAGNOSIS — I428 Other cardiomyopathies: Secondary | ICD-10-CM | POA: Diagnosis not present

## 2018-12-05 DIAGNOSIS — N401 Enlarged prostate with lower urinary tract symptoms: Secondary | ICD-10-CM | POA: Diagnosis not present

## 2018-12-05 DIAGNOSIS — I2510 Atherosclerotic heart disease of native coronary artery without angina pectoris: Secondary | ICD-10-CM | POA: Diagnosis not present

## 2018-12-05 DIAGNOSIS — G40909 Epilepsy, unspecified, not intractable, without status epilepticus: Secondary | ICD-10-CM | POA: Diagnosis not present

## 2018-12-05 DIAGNOSIS — E871 Hypo-osmolality and hyponatremia: Secondary | ICD-10-CM | POA: Diagnosis not present

## 2018-12-05 DIAGNOSIS — K900 Celiac disease: Secondary | ICD-10-CM | POA: Diagnosis not present

## 2018-12-05 DIAGNOSIS — I5030 Unspecified diastolic (congestive) heart failure: Secondary | ICD-10-CM | POA: Diagnosis not present

## 2018-12-05 DIAGNOSIS — I110 Hypertensive heart disease with heart failure: Secondary | ICD-10-CM | POA: Diagnosis not present

## 2018-12-06 DIAGNOSIS — E871 Hypo-osmolality and hyponatremia: Secondary | ICD-10-CM | POA: Diagnosis not present

## 2018-12-07 DIAGNOSIS — E871 Hypo-osmolality and hyponatremia: Secondary | ICD-10-CM | POA: Diagnosis not present

## 2018-12-08 DIAGNOSIS — E871 Hypo-osmolality and hyponatremia: Secondary | ICD-10-CM | POA: Diagnosis not present

## 2018-12-09 DIAGNOSIS — E871 Hypo-osmolality and hyponatremia: Secondary | ICD-10-CM | POA: Diagnosis not present

## 2018-12-10 DIAGNOSIS — I2510 Atherosclerotic heart disease of native coronary artery without angina pectoris: Secondary | ICD-10-CM | POA: Diagnosis not present

## 2018-12-10 DIAGNOSIS — E871 Hypo-osmolality and hyponatremia: Secondary | ICD-10-CM | POA: Diagnosis not present

## 2018-12-10 DIAGNOSIS — E611 Iron deficiency: Secondary | ICD-10-CM | POA: Diagnosis not present

## 2018-12-10 DIAGNOSIS — N401 Enlarged prostate with lower urinary tract symptoms: Secondary | ICD-10-CM | POA: Diagnosis not present

## 2018-12-10 DIAGNOSIS — G40909 Epilepsy, unspecified, not intractable, without status epilepticus: Secondary | ICD-10-CM | POA: Diagnosis not present

## 2018-12-11 DIAGNOSIS — E871 Hypo-osmolality and hyponatremia: Secondary | ICD-10-CM | POA: Diagnosis not present

## 2018-12-12 DIAGNOSIS — K900 Celiac disease: Secondary | ICD-10-CM | POA: Diagnosis not present

## 2018-12-12 DIAGNOSIS — I2510 Atherosclerotic heart disease of native coronary artery without angina pectoris: Secondary | ICD-10-CM | POA: Diagnosis not present

## 2018-12-12 DIAGNOSIS — G40909 Epilepsy, unspecified, not intractable, without status epilepticus: Secondary | ICD-10-CM | POA: Diagnosis not present

## 2018-12-12 DIAGNOSIS — I5030 Unspecified diastolic (congestive) heart failure: Secondary | ICD-10-CM | POA: Diagnosis not present

## 2018-12-12 DIAGNOSIS — C07 Malignant neoplasm of parotid gland: Secondary | ICD-10-CM | POA: Diagnosis not present

## 2018-12-12 DIAGNOSIS — M1A39X0 Chronic gout due to renal impairment, multiple sites, without tophus (tophi): Secondary | ICD-10-CM | POA: Diagnosis not present

## 2018-12-12 DIAGNOSIS — N401 Enlarged prostate with lower urinary tract symptoms: Secondary | ICD-10-CM | POA: Diagnosis not present

## 2018-12-12 DIAGNOSIS — E871 Hypo-osmolality and hyponatremia: Secondary | ICD-10-CM | POA: Diagnosis not present

## 2018-12-12 DIAGNOSIS — I110 Hypertensive heart disease with heart failure: Secondary | ICD-10-CM | POA: Diagnosis not present

## 2018-12-12 DIAGNOSIS — I428 Other cardiomyopathies: Secondary | ICD-10-CM | POA: Diagnosis not present

## 2018-12-13 DIAGNOSIS — I5030 Unspecified diastolic (congestive) heart failure: Secondary | ICD-10-CM | POA: Diagnosis not present

## 2018-12-13 DIAGNOSIS — C07 Malignant neoplasm of parotid gland: Secondary | ICD-10-CM | POA: Diagnosis not present

## 2018-12-13 DIAGNOSIS — G40909 Epilepsy, unspecified, not intractable, without status epilepticus: Secondary | ICD-10-CM | POA: Diagnosis not present

## 2018-12-13 DIAGNOSIS — N401 Enlarged prostate with lower urinary tract symptoms: Secondary | ICD-10-CM | POA: Diagnosis not present

## 2018-12-13 DIAGNOSIS — I2510 Atherosclerotic heart disease of native coronary artery without angina pectoris: Secondary | ICD-10-CM | POA: Diagnosis not present

## 2018-12-13 DIAGNOSIS — M1A39X0 Chronic gout due to renal impairment, multiple sites, without tophus (tophi): Secondary | ICD-10-CM | POA: Diagnosis not present

## 2018-12-13 DIAGNOSIS — I428 Other cardiomyopathies: Secondary | ICD-10-CM | POA: Diagnosis not present

## 2018-12-13 DIAGNOSIS — E871 Hypo-osmolality and hyponatremia: Secondary | ICD-10-CM | POA: Diagnosis not present

## 2018-12-13 DIAGNOSIS — K900 Celiac disease: Secondary | ICD-10-CM | POA: Diagnosis not present

## 2018-12-13 DIAGNOSIS — I110 Hypertensive heart disease with heart failure: Secondary | ICD-10-CM | POA: Diagnosis not present

## 2018-12-14 DIAGNOSIS — E871 Hypo-osmolality and hyponatremia: Secondary | ICD-10-CM | POA: Diagnosis not present

## 2018-12-15 DIAGNOSIS — E871 Hypo-osmolality and hyponatremia: Secondary | ICD-10-CM | POA: Diagnosis not present

## 2018-12-16 DIAGNOSIS — E871 Hypo-osmolality and hyponatremia: Secondary | ICD-10-CM | POA: Diagnosis not present

## 2018-12-17 DIAGNOSIS — E871 Hypo-osmolality and hyponatremia: Secondary | ICD-10-CM | POA: Diagnosis not present

## 2018-12-18 DIAGNOSIS — E871 Hypo-osmolality and hyponatremia: Secondary | ICD-10-CM | POA: Diagnosis not present

## 2018-12-19 DIAGNOSIS — E871 Hypo-osmolality and hyponatremia: Secondary | ICD-10-CM | POA: Diagnosis not present

## 2018-12-20 DIAGNOSIS — E871 Hypo-osmolality and hyponatremia: Secondary | ICD-10-CM | POA: Diagnosis not present

## 2018-12-21 DIAGNOSIS — E871 Hypo-osmolality and hyponatremia: Secondary | ICD-10-CM | POA: Diagnosis not present

## 2018-12-22 DIAGNOSIS — E871 Hypo-osmolality and hyponatremia: Secondary | ICD-10-CM | POA: Diagnosis not present

## 2018-12-23 DIAGNOSIS — E871 Hypo-osmolality and hyponatremia: Secondary | ICD-10-CM | POA: Diagnosis not present

## 2018-12-24 DIAGNOSIS — E871 Hypo-osmolality and hyponatremia: Secondary | ICD-10-CM | POA: Diagnosis not present

## 2018-12-25 DIAGNOSIS — E871 Hypo-osmolality and hyponatremia: Secondary | ICD-10-CM | POA: Diagnosis not present

## 2018-12-26 DIAGNOSIS — E871 Hypo-osmolality and hyponatremia: Secondary | ICD-10-CM | POA: Diagnosis not present

## 2018-12-27 DIAGNOSIS — E871 Hypo-osmolality and hyponatremia: Secondary | ICD-10-CM | POA: Diagnosis not present

## 2018-12-28 DIAGNOSIS — E871 Hypo-osmolality and hyponatremia: Secondary | ICD-10-CM | POA: Diagnosis not present

## 2018-12-29 ENCOUNTER — Emergency Department (HOSPITAL_COMMUNITY)
Admission: EM | Admit: 2018-12-29 | Discharge: 2018-12-30 | Disposition: A | Discharge status: Home / Self Care | Payer: Medicare HMO | Attending: Emergency Medicine | Admitting: Emergency Medicine

## 2018-12-29 ENCOUNTER — Other Ambulatory Visit: Payer: Self-pay

## 2018-12-29 ENCOUNTER — Encounter (HOSPITAL_COMMUNITY): Payer: Self-pay

## 2018-12-29 DIAGNOSIS — Z79899 Other long term (current) drug therapy: Secondary | ICD-10-CM | POA: Diagnosis not present

## 2018-12-29 DIAGNOSIS — F10120 Alcohol abuse with intoxication, uncomplicated: Secondary | ICD-10-CM | POA: Diagnosis not present

## 2018-12-29 DIAGNOSIS — I959 Hypotension, unspecified: Secondary | ICD-10-CM | POA: Diagnosis not present

## 2018-12-29 DIAGNOSIS — R9431 Abnormal electrocardiogram [ECG] [EKG]: Secondary | ICD-10-CM | POA: Diagnosis not present

## 2018-12-29 DIAGNOSIS — R51 Headache: Secondary | ICD-10-CM | POA: Diagnosis not present

## 2018-12-29 DIAGNOSIS — F1099 Alcohol use, unspecified with unspecified alcohol-induced disorder: Secondary | ICD-10-CM | POA: Diagnosis present

## 2018-12-29 DIAGNOSIS — F10920 Alcohol use, unspecified with intoxication, uncomplicated: Secondary | ICD-10-CM | POA: Diagnosis not present

## 2018-12-29 DIAGNOSIS — S0990XA Unspecified injury of head, initial encounter: Secondary | ICD-10-CM | POA: Diagnosis not present

## 2018-12-29 DIAGNOSIS — F10929 Alcohol use, unspecified with intoxication, unspecified: Secondary | ICD-10-CM | POA: Diagnosis not present

## 2018-12-29 DIAGNOSIS — J449 Chronic obstructive pulmonary disease, unspecified: Secondary | ICD-10-CM | POA: Diagnosis not present

## 2018-12-29 DIAGNOSIS — S199XXA Unspecified injury of neck, initial encounter: Secondary | ICD-10-CM | POA: Diagnosis not present

## 2018-12-29 DIAGNOSIS — W19XXXA Unspecified fall, initial encounter: Secondary | ICD-10-CM | POA: Diagnosis not present

## 2018-12-29 DIAGNOSIS — E871 Hypo-osmolality and hyponatremia: Secondary | ICD-10-CM | POA: Diagnosis not present

## 2018-12-29 DIAGNOSIS — F17210 Nicotine dependence, cigarettes, uncomplicated: Secondary | ICD-10-CM | POA: Insufficient documentation

## 2018-12-29 MED ORDER — SODIUM CHLORIDE 0.9 % IV BOLUS (SEPSIS)
500.0000 mL | Freq: Once | INTRAVENOUS | Status: AC
  Administered 2018-12-30: 500 mL via INTRAVENOUS

## 2018-12-29 NOTE — ED Triage Notes (Signed)
Pt BIB [facility] for eval of fall/Slip OOB. Pt reportedly was out drinking w/ friend tonight and was found at his assisted living apartment down on the floor by his bed. Assisted back to bed by staff. Per staff via EMS, pt w/ slurred speech at baseline, but is more slurred than normal. EMS reports pt became exceptionally agitated when EMS attempted c-spine immobilization and they were unable to do so. No obvious s/sx of trauma.

## 2018-12-30 ENCOUNTER — Ambulatory Visit (INDEPENDENT_AMBULATORY_CARE_PROVIDER_SITE_OTHER): Payer: Medicare HMO | Admitting: *Deleted

## 2018-12-30 ENCOUNTER — Emergency Department (HOSPITAL_COMMUNITY): Payer: Medicare HMO

## 2018-12-30 DIAGNOSIS — I442 Atrioventricular block, complete: Secondary | ICD-10-CM

## 2018-12-30 DIAGNOSIS — S0990XA Unspecified injury of head, initial encounter: Secondary | ICD-10-CM | POA: Diagnosis not present

## 2018-12-30 DIAGNOSIS — S199XXA Unspecified injury of neck, initial encounter: Secondary | ICD-10-CM | POA: Diagnosis not present

## 2018-12-30 DIAGNOSIS — R51 Headache: Secondary | ICD-10-CM | POA: Diagnosis not present

## 2018-12-30 DIAGNOSIS — F10920 Alcohol use, unspecified with intoxication, uncomplicated: Secondary | ICD-10-CM | POA: Diagnosis not present

## 2018-12-30 DIAGNOSIS — E871 Hypo-osmolality and hyponatremia: Secondary | ICD-10-CM | POA: Diagnosis not present

## 2018-12-30 LAB — URINALYSIS, ROUTINE W REFLEX MICROSCOPIC
Bilirubin Urine: NEGATIVE
Glucose, UA: NEGATIVE mg/dL
Hgb urine dipstick: NEGATIVE
Ketones, ur: NEGATIVE mg/dL
Leukocytes,Ua: NEGATIVE
Nitrite: NEGATIVE
Protein, ur: NEGATIVE mg/dL
Specific Gravity, Urine: 1.005 (ref 1.005–1.030)
pH: 5 (ref 5.0–8.0)

## 2018-12-30 LAB — COMPREHENSIVE METABOLIC PANEL
ALT: 13 U/L (ref 0–44)
AST: 23 U/L (ref 15–41)
Albumin: 3.2 g/dL — ABNORMAL LOW (ref 3.5–5.0)
Alkaline Phosphatase: 63 U/L (ref 38–126)
Anion gap: 9 (ref 5–15)
BUN: 13 mg/dL (ref 8–23)
CO2: 22 mmol/L (ref 22–32)
Calcium: 8.3 mg/dL — ABNORMAL LOW (ref 8.9–10.3)
Chloride: 96 mmol/L — ABNORMAL LOW (ref 98–111)
Creatinine, Ser: 1.16 mg/dL (ref 0.61–1.24)
GFR calc Af Amer: >60 mL/min (ref >60)
GFR calc non Af Amer: >60 mL/min (ref >60)
Glucose, Bld: 93 mg/dL (ref 70–99)
Potassium: 3.4 mmol/L — ABNORMAL LOW (ref 3.5–5.1)
Sodium: 127 mmol/L — ABNORMAL LOW (ref 135–145)
Total Bilirubin: 0.3 mg/dL (ref 0.3–1.2)
Total Protein: 6.4 g/dL — ABNORMAL LOW (ref 6.5–8.1)

## 2018-12-30 LAB — CBC WITH DIFFERENTIAL/PLATELET
Abs Immature Granulocytes: 0.02 10*3/uL (ref 0.00–0.07)
Basophils Absolute: 0 10*3/uL (ref 0.0–0.1)
Basophils Relative: 0 %
Eosinophils Absolute: 0.3 10*3/uL (ref 0.0–0.5)
Eosinophils Relative: 4 %
HCT: 32.5 % — ABNORMAL LOW (ref 39.0–52.0)
Hemoglobin: 10.8 g/dL — ABNORMAL LOW (ref 13.0–17.0)
Immature Granulocytes: 0 %
Lymphocytes Relative: 16 %
Lymphs Abs: 1.1 10*3/uL (ref 0.7–4.0)
MCH: 29.8 pg (ref 26.0–34.0)
MCHC: 33.2 g/dL (ref 30.0–36.0)
MCV: 89.5 fL (ref 80.0–100.0)
Monocytes Absolute: 0.6 10*3/uL (ref 0.1–1.0)
Monocytes Relative: 8 %
Neutro Abs: 4.8 10*3/uL (ref 1.7–7.7)
Neutrophils Relative %: 72 %
Platelets: 238 10*3/uL (ref 150–400)
RBC: 3.63 MIL/uL — ABNORMAL LOW (ref 4.22–5.81)
RDW: 14 % (ref 11.5–15.5)
WBC: 6.8 10*3/uL (ref 4.0–10.5)
nRBC: 0 % (ref 0.0–0.2)

## 2018-12-30 LAB — RAPID URINE DRUG SCREEN, HOSP PERFORMED
Amphetamines: NOT DETECTED
Barbiturates: NOT DETECTED
Benzodiazepines: NOT DETECTED
Cocaine: NOT DETECTED
Opiates: NOT DETECTED
Tetrahydrocannabinol: NOT DETECTED

## 2018-12-30 LAB — ETHANOL: Alcohol, Ethyl (B): 218 mg/dL — ABNORMAL HIGH (ref <10)

## 2018-12-30 LAB — CBG MONITORING, ED: Glucose-Capillary: 85 mg/dL (ref 70–99)

## 2018-12-30 NOTE — Discharge Instructions (Addendum)
Your sodium level today was low at 127 which is chronic for you given your history of alcohol abuse.  I recommend you have this rechecked in 1 week to ensure it is not worsening.

## 2018-12-30 NOTE — ED Provider Notes (Signed)
Care assumed from Dr. Leonides Schanz, please see her note for full details, but in brief Francisco [name] is a 73 y.o. male who presents from SNF with concerns for alcohol intoxication and falls.  He has no obvious evidence of trauma or injury on exam and CT of the head and neck were unremarkable.  Patient reports going to a bar with a friend denying having several beers and ethanol level was elevated at 218.  Work-up is otherwise reassuring.  Patient has been eating and drinking, initially had difficulty ambulating, at baseline uses a walker independently at facility.  Will give patient time to sober and then reevaluate and have patient ambulate with walker, if he is able to ambulate with steady gait he can be discharged back to facility via [facility].  Plan: Ambulate w/ walker if steady can be discharged back to facility  Labs Reviewed  CBC WITH DIFFERENTIAL/PLATELET - Abnormal; Notable for the following components:      Result Value   RBC 3.63 (*)    Hemoglobin 10.8 (*)    HCT 32.5 (*)    All other components within normal limits  COMPREHENSIVE METABOLIC PANEL - Abnormal; Notable for the following components:   Sodium 127 (*)    Potassium 3.4 (*)    Chloride 96 (*)    Calcium 8.3 (*)    Total Protein 6.4 (*)    Albumin 3.2 (*)    All other components within normal limits  ETHANOL - Abnormal; Notable for the following components:   Alcohol, Ethyl (B) 218 (*)    All other components within normal limits  URINALYSIS, ROUTINE W REFLEX MICROSCOPIC  RAPID URINE DRUG SCREEN, HOSP PERFORMED  CBG MONITORING, ED   Ct Head Wo Contrast  Result Date: 12/30/2018 CLINICAL DATA:  73 y/o M; fall, intoxicated, headache, posttraumatic. EXAM: CT HEAD WITHOUT CONTRAST CT CERVICAL SPINE WITHOUT CONTRAST TECHNIQUE: Multidetector CT imaging of the head and cervical spine was performed following the standard protocol without intravenous contrast. Multiplanar CT image reconstructions of the cervical spine were also generated.  COMPARISON:  04/03/2018 CT head and cervical spine. FINDINGS: CT HEAD FINDINGS Brain: No evidence of acute infarction, hemorrhage, hydrocephalus, extra-axial collection or mass lesion/mass effect. 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 Stable nonspecific white matter hypodensities compatible with chronic microvascular ischemic changes and stable volume loss of the brain. Vascular: Calcific atherosclerosis of carotid siphons. No hyperdense vessel identified. Skull: Normal. Negative for fracture or focal lesion. Sinuses/Orbits: No acute finding. Other: None. CT CERVICAL SPINE FINDINGS Alignment: C3-4 and C6-7 grade 1 anterolisthesis. Skull base and vertebrae: No acute fracture. No primary bone lesion or focal pathologic process. Soft tissues and spinal canal: No prevertebral fluid or swelling. No visible canal hematoma. Disc levels: Moderate discogenic degenerative changes greatest at the C4-C7 levels. Uncovertebral and facet hypertrophy results in bony foraminal stenosis at the left C3-4, right C4-5, bilateral C5-6, and left C6-7 levels. Upper chest: Negative. Other: Severe calcific atherosclerosis of the carotid systems. IMPRESSION: 1. No acute intracranial abnormality identified. 2. Stable chronic microvascular ischemic changes and volume loss of the brain. 3. No acute fracture or dislocation of the cervical spine. 4. Moderate cervical spondylosis greatest at C4-C7 levels. 5. Severe calcific atherosclerosis of carotid systems. Electronically Signed   By: Kristine Garbe M.D.   On: 12/30/2018 00:53   Ct Cervical Spine Wo Contrast  Result Date: 12/30/2018 CLINICAL DATA:  73 y/o M; fall, intoxicated, headache, posttraumatic. EXAM: CT HEAD WITHOUT CONTRAST CT CERVICAL SPINE  WITHOUT CONTRAST TECHNIQUE: Multidetector CT imaging of the head and cervical spine was performed following the standard protocol without intravenous contrast. Multiplanar CT image reconstructions of the cervical  spine were also generated. COMPARISON:  04/03/2018 CT head and cervical spine. FINDINGS: CT HEAD FINDINGS Brain: No evidence of acute infarction, hemorrhage, hydrocephalus, extra-axial collection or mass lesion/mass effect. 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 Stable nonspecific white matter hypodensities compatible with chronic microvascular ischemic changes and stable volume loss of the brain. Vascular: Calcific atherosclerosis of carotid siphons. No hyperdense vessel identified. Skull: Normal. Negative for fracture or focal lesion. Sinuses/Orbits: No acute finding. Other: None. CT CERVICAL SPINE FINDINGS Alignment: C3-4 and C6-7 grade 1 anterolisthesis. Skull base and vertebrae: No acute fracture. No primary bone lesion or focal pathologic process. Soft tissues and spinal canal: No prevertebral fluid or swelling. No visible canal hematoma. Disc levels: Moderate discogenic degenerative changes greatest at the C4-C7 levels. Uncovertebral and facet hypertrophy results in bony foraminal stenosis at the left C3-4, right C4-5, bilateral C5-6, and left C6-7 levels. Upper chest: Negative. Other: Severe calcific atherosclerosis of the carotid systems. IMPRESSION: 1. No acute intracranial abnormality identified. 2. Stable chronic microvascular ischemic changes and volume loss of the brain. 3. No acute fracture or dislocation of the cervical spine. 4. Moderate cervical spondylosis greatest at C4-C7 levels. 5. Severe calcific atherosclerosis of carotid systems. Electronically Signed   By: Kristine Garbe M.D.   On: 12/30/2018 00:53    MDM  9:05 AM patient ambulated in the hallway with walker with steady gait and reports to staff that he is ready to go back to his facility.  He is been able to eat breakfast and has had no difficulties tolerating p.o.  At this time patient is stable for discharge back to skilled nursing facility.  His sodium was noted to be low today although this  appears to be chronic and likely related to alcohol intake, he is not symptomatic with this today and has been recommended to follow-up with his primary care doctor for recheck of this.  Final diagnoses:  Alcoholic intoxication without complication [facility])     Jacqlyn Larsen, [place] 12/30/18 [phone]

## 2018-12-30 NOTE — ED Notes (Signed)
Pt provided water, pt consumed with no difficulty

## 2018-12-30 NOTE — ED Notes (Signed)
CBG Results of 85 reported to RN, Autumn.

## 2018-12-30 NOTE — ED Notes (Signed)
Pt moved to room for assistance with urination, pt very unsteady, atleast 2 person assist.

## 2018-12-30 NOTE — ED Notes (Signed)
Pt. Ambulated with steady gait with walker. Pt. Stated "now tell the doctor i'm ready to go home"

## 2018-12-30 NOTE — ED Notes (Signed)
Pt requested to urinate.  Stood at bedside and used urinal with standby assistance only.  Eating breakfast.

## 2018-12-30 NOTE — ED Provider Notes (Signed)
TIME SEEN: 12:12 AM  CHIEF COMPLAINT: Alcohol intoxication, fall  HPI: Patient is a Francisco [name] with history of hypertension, COPD, symptomatic bradycardia status post pacemaker who presents to the emergency department from his assisted living facility with concerns of alcohol intoxication and falls.  Was found slumped over the side of his bed by staff.  States they put him back in the bed and then he fell out of the bed again.  He states he was trying to get out of bed and slipped because of his socks.  It does not appear that he hit his head but patient is an unreliable historian as he is intoxicated.  States he went out with a friend tonight to the bar and had several beers.  Denies any drug use.  Denies any pain.  Denies numbness or weakness.  ROS: See HPI Constitutional: no fever  Eyes: no drainage  ENT: no runny nose   Cardiovascular:  no chest pain  Resp: no SOB  GI: no vomiting GU: no dysuria Integumentary: no rash  Allergy: no hives  Musculoskeletal: no leg swelling  Neurological: no slurred speech ROS otherwise negative  PAST MEDICAL HISTORY/PAST SURGICAL HISTORY:  Past Medical History:  Diagnosis Date  . Anemia   . Anginal pain ([place])    normal coronaries 2016  . Celiac disease   . COPD (chronic obstructive pulmonary disease) ([place])   . GERD (gastroesophageal reflux disease)   . Heart murmur   . History of radiation therapy 10/07/18- 11/04/18   Parotid, left 50 Gy in 20 fractions of 2.5 Gy.   Marland Kitchen Hypertension   . Multiple duodenal ulcers   . Presence of permanent cardiac pacemaker   . Right internal carotid occlusion   . Seizures ([place])   . Shortness of breath   . Symptomatic Bradycardia    a. 07/2015 s/p MDT Advisa L DC PPM (Ser #: [id number]).  . 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 Authorizing Provider  allopurinol (ZYLOPRIM) 300 MG tablet  TAKE 1 TABLET(300 MG) BY MOUTH DAILY Patient taking differently: Take 300 mg by mouth daily.  05/30/18   Dena Billet B, PA-C  amLODipine (NORVASC) 5 MG tablet TAKE 1 TABLET(5 MG) BY MOUTH DAILY 05/30/18   Dena Billet B, PA-C  atorvastatin (LIPITOR) 20 MG tablet Take 1 tablet (20 mg total) by mouth daily. 11/23/17   Albion, Modena Nunnery, MD  clotrimazole-betamethasone (LOTRISONE) cream APPLY EXTERNALLY TO THE AFFECTED AREA TWICE DAILY 03/22/17   Alycia Rossetti, MD  colchicine 0.6 MG tablet Take 1 tablet (0.6 mg total) by mouth 2 (two) times daily as needed (gout flare). Take twice daily for 2 weeks, then use as needed for gout flare up 12/04/17   Eugenie Filler, MD  docusate sodium (COLACE) 100 MG capsule Take 100 mg by mouth daily.    [provider]  ferrous sulfate 325 (65 FE) MG tablet Take 1 tablet (325 mg total) by mouth daily. 12/04/17   Eugenie Filler, MD  fluticasone (FLONASE) 50 MCG/ACT nasal spray Place 2 sprays into both nostrils daily.    [provider]  folic acid (FOLVITE) 1 MG tablet Take 1 tablet (1 mg total) by mouth daily. 12/05/17   Eugenie Filler, MD  furosemide (LASIX) 20 MG tablet TAKE 1 TABLET BY MOUTH DAILY 01/23/18   Alycia Rossetti, MD  guaiFENesin (MUCINEX) 600 MG 12 hr  tablet Take 600-1,200 mg by mouth 2 (two) times daily as needed for cough.    [provider]  guaiFENesin (ROBITUSSIN) 100 MG/5ML liquid Take 200 mg by mouth every 4 (four) hours as needed for cough.    [provider]  HYDROcodone-acetaminophen (NORCO/VICODIN) 5-325 MG tablet Take 1 tablet by mouth every 4 (four) hours as needed for moderate pain. 08/21/18 08/21/19  Marcellino, San Jetty, MD  levETIRAcetam (KEPPRA) 500 MG tablet Take 1,500 mg by mouth daily.    [provider]  lidocaine (XYLOCAINE) 2 % solution Patient: Mix 1part 2% viscous lidocaine, 1part H20. Swish & swallow 79m of diluted mixture, 290m before meals and at bedtime, up to QID 10/21/18    SqEppie GibsonMD  lisinopril (PRINIVIL,ZESTRIL) 20 MG tablet TAKE 1 TABLET(20 MG) BY MOUTH DAILY Patient taking differently: Take 20 mg by mouth daily.  06/03/18   DiDena Billet, PA-C  metoprolol tartrate (LOPRESSOR) 50 MG tablet Take 1.5 tablets (Francisco mg total) by mouth 2 (two) times daily. 02/05/18 08/09/18  Camnitz, WiOcie DoyneMD  Multiple Vitamin (MULTIVITAMIN WITH MINERALS) TABS tablet Take 1 tablet by mouth daily. 12/05/17   ThEugenie FillerMD  neomycin-colistin-hydrocortisone-thonzonium (CORTISPORIN-TC) 3.01-06-09-0.5 MG/ML OTIC suspension Place 3 drops into the left ear 4 (four) times daily. 11/15/18   SqEppie GibsonMD  nystatin (MYCOSTATIN) 100000 UNIT/ML suspension Take 5 mLs (500,000 Units total) by mouth 4 (four) times daily. Swish and gargle in mouth for a minute, then swallow. This should help thrush in mouth. Use QID until bottle is empty. 10/21/18   SqEppie GibsonMD  pantoprazole (PROTONIX) 40 MG tablet TAKE 1 TABLET BY MOUTH TWICE DAILY BEFORE A MEAL Patient taking differently: Take 40 mg by mouth 2 (two) times daily.  05/30/18   DiDena Billet, PA-C  polyethylene glycol (MIRALAX / GLYCOLAX) packet Take 17 g by mouth 2 (two) times daily. 12/04/17   ThEugenie FillerMD  potassium chloride (K-DUR) 10 MEQ tablet TAKE 1 TABLET(10 MEQ) BY MOUTH DAILY Patient taking differently: Take 10 mEq by mouth daily.  05/30/18   DiOrlena SheldonPA-C  tamsulosin (FLOMAX) 0.4 MG CAPS capsule TAKE 1 CAPSULE(0.4 MG) BY MOUTH DAILY Patient taking differently: Take 0.4 mg by mouth daily.  05/30/18   DiOrlena SheldonPA-C  thiamine 100 MG tablet Take 1 tablet (100 mg total) by mouth daily. 12/05/17   ThEugenie FillerMD    ALLERGIES:  No Known Allergies  SOCIAL HISTORY:  Social History   Tobacco Use  . Smoking status: Current Every Day Smoker    Packs/day: 0.25    Years: 50.00    Pack years: 12.50    Types: Cigarettes  . Smokeless tobacco: Current User  . Tobacco comment: 3 cigarettes daily-  09/24/18  Substance Use Topics  . Alcohol use: Yes    FAMILY HISTORY: Family History  Problem Relation Age of Onset  . Hodgkin's lymphoma Father     EXAM: BP (!) 96/51 (BP Location: Left Arm)   Pulse 64   Temp 97.8 F (36.6 C) (Oral)   Resp 18   Ht 5' 9"  (1.753 m)   Wt 79.4 kg   SpO2 99%   BMI 25.84 kg/m  CONSTITUTIONAL: Alert and oriented x 3 and responds appropriately to questions. Well-appearing; well-nourished; GCS 1545elderly, appears intoxicated HEAD: Normocephalic; atraumatic EYES: Conjunctivae clear, PERRL, EOMI ENT: normal nose; no rhinorrhea; moist mucous membranes; pharynx without lesions noted; no dental injury; no septal hematoma NECK:  Supple, no meningismus, no LAD; no midline spinal tenderness, step-off or deformity; trachea midline CARD: RRR; S1 and S2 appreciated; no murmurs, no clicks, no rubs, no gallops RESP: Normal chest excursion without splinting or tachypnea; breath sounds clear and equal bilaterally; no wheezes, no rhonchi, no rales; no hypoxia or respiratory distress CHEST:  chest wall stable, no crepitus or ecchymosis or deformity, nontender to palpation; no flail chest ABD/GI: Normal bowel sounds; non-distended; soft, non-tender, no rebound, no guarding; no ecchymosis or other lesions noted PELVIS:  stable, nontender to palpation BACK:  The back appears normal and is non-tender to palpation, there is no CVA tenderness; no midline spinal tenderness, step-off or deformity EXT: Normal ROM in all joints; non-tender to palpation; no edema; normal capillary refill; no cyanosis, no bony tenderness or bony deformity of patient's extremities, no joint effusion, compartments are soft, extremities are warm and well-perfused, no ecchymosis SKIN: Normal color for age and race; warm NEURO: Moves all extremities equally, no drift, sensation to light touch intact diffusely, patient has slurred speech, cranial nerves II through XII intact PSYCH: The patient's mood and  manner are appropriate. Grooming and personal hygiene are appropriate.  MEDICAL DECISION MAKING: Patient here with 2 falls out of bed and alcohol intoxication.  Appears intoxicated on examination.  No obvious sign of trauma and no focal neurologic deficit other than dysarthria which may be from alcohol intoxication.  Nursing home reported patient has some dysarthria at baseline.  EKG shows no ischemic abnormality.  Will obtain labs, urine, CT of the head and cervical spine.  Will monitor until clinically sober.  Will interrogate patient's pacemaker.  ED PROGRESS: Pacemaker interrogated and no arrhythmia or abnormalities noted today or since mid December 2019.  Labs unremarkable other than sodium level of 127.  Does not appear to be on diuretics.  Has been admitted for hyponatremia in the past thought secondary to his alcohol abuse.  It is documented that his baseline sodium is around 129.  Received 500 mL of IV fluids in the ED.  We will have this rechecked by his primary care physician next week.  CT of the head and cervical spine unremarkable.  Alcohol level 218.  Patient will need to be monitored until clinically sober.  4:30 AM  Pt resting comfortably and difficult to arouse.  Likely still intoxicated.  Will ambulate and p.o. challenge when clinically sober.   5:50 AM  Pt able to drink without difficulty but unable to ambulate without very unsteady gait.  States he normally uses a walker.  He will need to be monitored a little longer and then reattempt ambulation prior to discharge back to nursing facility.   I reviewed all nursing notes, vitals, pertinent previous records, EKGs, lab and urine results, imaging (as available).   EKG Interpretation  Date/Time:  Monday December 30 2018 00:28:19 EST Ventricular Rate:  64 PR Interval:    QRS Duration: 157 QT Interval:  481 QTC Calculation: 497 R Axis:   -77 Text Interpretation:  AV dual-paced rhythm No significant change since last tracing  Confirmed by Ward, Cyril Mourning [phone]) on 12/30/2018 12:48:01 AM         Ward, Delice Bison, DO 12/30/18 [id number]

## 2018-12-30 NOTE — ED Notes (Signed)
Called [facility]tar for transport

## 2018-12-31 DIAGNOSIS — E611 Iron deficiency: Secondary | ICD-10-CM | POA: Diagnosis not present

## 2018-12-31 DIAGNOSIS — E871 Hypo-osmolality and hyponatremia: Secondary | ICD-10-CM | POA: Diagnosis not present

## 2018-12-31 DIAGNOSIS — G40909 Epilepsy, unspecified, not intractable, without status epilepticus: Secondary | ICD-10-CM | POA: Diagnosis not present

## 2018-12-31 DIAGNOSIS — N401 Enlarged prostate with lower urinary tract symptoms: Secondary | ICD-10-CM | POA: Diagnosis not present

## 2018-12-31 DIAGNOSIS — K219 Gastro-esophageal reflux disease without esophagitis: Secondary | ICD-10-CM | POA: Diagnosis not present

## 2018-12-31 DIAGNOSIS — I2510 Atherosclerotic heart disease of native coronary artery without angina pectoris: Secondary | ICD-10-CM | POA: Diagnosis not present

## 2019-01-01 DIAGNOSIS — E871 Hypo-osmolality and hyponatremia: Secondary | ICD-10-CM | POA: Diagnosis not present

## 2019-01-02 DIAGNOSIS — Z79899 Other long term (current) drug therapy: Secondary | ICD-10-CM | POA: Diagnosis not present

## 2019-01-02 DIAGNOSIS — E871 Hypo-osmolality and hyponatremia: Secondary | ICD-10-CM | POA: Diagnosis not present

## 2019-01-03 DIAGNOSIS — E871 Hypo-osmolality and hyponatremia: Secondary | ICD-10-CM | POA: Diagnosis not present

## 2019-01-03 LAB — CUP PACEART REMOTE DEVICE CHECK
Battery Impedance: 206 Ohm
Battery Remaining Longevity: 125 mo
Battery Voltage: 2.79 V
Brady Statistic AP VP Percent: 29 %
Brady Statistic AP VS Percent: 0 %
Brady Statistic AS VP Percent: 70 %
Brady Statistic AS VS Percent: 0 %
Date Time Interrogation Session: 20200224061048
Implantable Lead Implant Date: 20160902
Implantable Lead Implant Date: 20160902
Implantable Lead Location: 753859
Implantable Lead Location: 753860
Implantable Lead Model: 5076
Implantable Lead Model: 5076
Implantable Pulse Generator Implant Date: 20160902
Lead Channel Impedance Value: 467 Ohm
Lead Channel Impedance Value: 514 Ohm
Lead Channel Pacing Threshold Amplitude: 0.625 V
Lead Channel Pacing Threshold Amplitude: 1 V
Lead Channel Pacing Threshold Pulse Width: 0.4 ms
Lead Channel Pacing Threshold Pulse Width: 0.4 ms
Lead Channel Setting Pacing Amplitude: 1.5 V
Lead Channel Setting Pacing Amplitude: 2 V
Lead Channel Setting Pacing Pulse Width: 0.4 ms
Lead Channel Setting Sensing Sensitivity: 4 mV

## 2019-01-04 DIAGNOSIS — E871 Hypo-osmolality and hyponatremia: Secondary | ICD-10-CM | POA: Diagnosis not present

## 2019-01-05 DIAGNOSIS — E871 Hypo-osmolality and hyponatremia: Secondary | ICD-10-CM | POA: Diagnosis not present

## 2019-01-06 DIAGNOSIS — E871 Hypo-osmolality and hyponatremia: Secondary | ICD-10-CM | POA: Diagnosis not present

## 2019-01-07 DIAGNOSIS — E871 Hypo-osmolality and hyponatremia: Secondary | ICD-10-CM | POA: Diagnosis not present

## 2019-01-07 NOTE — Progress Notes (Signed)
Remote pacemaker transmission.   

## 2019-01-08 DIAGNOSIS — E871 Hypo-osmolality and hyponatremia: Secondary | ICD-10-CM | POA: Diagnosis not present

## 2019-01-09 DIAGNOSIS — E871 Hypo-osmolality and hyponatremia: Secondary | ICD-10-CM | POA: Diagnosis not present

## 2019-01-10 DIAGNOSIS — E871 Hypo-osmolality and hyponatremia: Secondary | ICD-10-CM | POA: Diagnosis not present

## 2019-01-11 DIAGNOSIS — E871 Hypo-osmolality and hyponatremia: Secondary | ICD-10-CM | POA: Diagnosis not present

## 2019-01-12 DIAGNOSIS — E871 Hypo-osmolality and hyponatremia: Secondary | ICD-10-CM | POA: Diagnosis not present

## 2019-01-13 DIAGNOSIS — E871 Hypo-osmolality and hyponatremia: Secondary | ICD-10-CM | POA: Diagnosis not present

## 2019-01-14 DIAGNOSIS — E871 Hypo-osmolality and hyponatremia: Secondary | ICD-10-CM | POA: Diagnosis not present

## 2019-01-15 DIAGNOSIS — E871 Hypo-osmolality and hyponatremia: Secondary | ICD-10-CM | POA: Diagnosis not present

## 2019-01-16 DIAGNOSIS — E871 Hypo-osmolality and hyponatremia: Secondary | ICD-10-CM | POA: Diagnosis not present

## 2019-01-17 DIAGNOSIS — M6281 Muscle weakness (generalized): Secondary | ICD-10-CM | POA: Diagnosis not present

## 2019-01-17 DIAGNOSIS — R278 Other lack of coordination: Secondary | ICD-10-CM | POA: Diagnosis not present

## 2019-01-17 DIAGNOSIS — E871 Hypo-osmolality and hyponatremia: Secondary | ICD-10-CM | POA: Diagnosis not present

## 2019-01-17 DIAGNOSIS — R293 Abnormal posture: Secondary | ICD-10-CM | POA: Diagnosis not present

## 2019-01-18 DIAGNOSIS — E871 Hypo-osmolality and hyponatremia: Secondary | ICD-10-CM | POA: Diagnosis not present

## 2019-01-19 DIAGNOSIS — E871 Hypo-osmolality and hyponatremia: Secondary | ICD-10-CM | POA: Diagnosis not present

## 2019-01-20 DIAGNOSIS — E871 Hypo-osmolality and hyponatremia: Secondary | ICD-10-CM | POA: Diagnosis not present

## 2019-01-21 DIAGNOSIS — E871 Hypo-osmolality and hyponatremia: Secondary | ICD-10-CM | POA: Diagnosis not present

## 2019-01-22 DIAGNOSIS — R278 Other lack of coordination: Secondary | ICD-10-CM | POA: Diagnosis not present

## 2019-01-22 DIAGNOSIS — E871 Hypo-osmolality and hyponatremia: Secondary | ICD-10-CM | POA: Diagnosis not present

## 2019-01-22 DIAGNOSIS — R293 Abnormal posture: Secondary | ICD-10-CM | POA: Diagnosis not present

## 2019-01-22 DIAGNOSIS — M6281 Muscle weakness (generalized): Secondary | ICD-10-CM | POA: Diagnosis not present

## 2019-01-23 DIAGNOSIS — R293 Abnormal posture: Secondary | ICD-10-CM | POA: Diagnosis not present

## 2019-01-23 DIAGNOSIS — R278 Other lack of coordination: Secondary | ICD-10-CM | POA: Diagnosis not present

## 2019-01-23 DIAGNOSIS — M6281 Muscle weakness (generalized): Secondary | ICD-10-CM | POA: Diagnosis not present

## 2019-01-23 DIAGNOSIS — E871 Hypo-osmolality and hyponatremia: Secondary | ICD-10-CM | POA: Diagnosis not present

## 2019-01-24 DIAGNOSIS — E871 Hypo-osmolality and hyponatremia: Secondary | ICD-10-CM | POA: Diagnosis not present

## 2019-01-25 DIAGNOSIS — E871 Hypo-osmolality and hyponatremia: Secondary | ICD-10-CM | POA: Diagnosis not present

## 2019-01-26 DIAGNOSIS — E871 Hypo-osmolality and hyponatremia: Secondary | ICD-10-CM | POA: Diagnosis not present

## 2019-01-27 DIAGNOSIS — R278 Other lack of coordination: Secondary | ICD-10-CM | POA: Diagnosis not present

## 2019-01-27 DIAGNOSIS — M6281 Muscle weakness (generalized): Secondary | ICD-10-CM | POA: Diagnosis not present

## 2019-01-27 DIAGNOSIS — R293 Abnormal posture: Secondary | ICD-10-CM | POA: Diagnosis not present

## 2019-01-28 DIAGNOSIS — R293 Abnormal posture: Secondary | ICD-10-CM | POA: Diagnosis not present

## 2019-01-28 DIAGNOSIS — M6281 Muscle weakness (generalized): Secondary | ICD-10-CM | POA: Diagnosis not present

## 2019-01-28 DIAGNOSIS — R278 Other lack of coordination: Secondary | ICD-10-CM | POA: Diagnosis not present

## 2019-01-30 DIAGNOSIS — R293 Abnormal posture: Secondary | ICD-10-CM | POA: Diagnosis not present

## 2019-01-30 DIAGNOSIS — R278 Other lack of coordination: Secondary | ICD-10-CM | POA: Diagnosis not present

## 2019-01-30 DIAGNOSIS — M6281 Muscle weakness (generalized): Secondary | ICD-10-CM | POA: Diagnosis not present

## 2019-01-31 ENCOUNTER — Telehealth: Payer: Self-pay

## 2019-01-31 DIAGNOSIS — R278 Other lack of coordination: Secondary | ICD-10-CM | POA: Diagnosis not present

## 2019-01-31 DIAGNOSIS — M6281 Muscle weakness (generalized): Secondary | ICD-10-CM | POA: Diagnosis not present

## 2019-01-31 DIAGNOSIS — R293 Abnormal posture: Secondary | ICD-10-CM | POA: Diagnosis not present

## 2019-01-31 NOTE — Telephone Encounter (Signed)
I have spoken with [name] regarding his appointments next week. He will come to [facility] for his CT scan on Monday 02/03/19. I have spoken with the transportation coordinator at [facility] and they plan to bring him to that appointment. He will either speak to Dr. Isidore Moos over the telephone or have a webex conference on Tuesday for the results of the CT scans. [name] is aware of the above. He is not allowed to have visitors at this time at his facility and will talk to Dr. Isidore Moos by himself.

## 2019-02-03 ENCOUNTER — Ambulatory Visit (HOSPITAL_COMMUNITY)
Admission: RE | Admit: 2019-02-03 | Discharge: 2019-02-03 | Disposition: A | Discharge status: Home / Self Care | Payer: Medicare HMO | Source: Ambulatory Visit | Attending: Radiation Oncology | Admitting: Radiation Oncology

## 2019-02-03 ENCOUNTER — Encounter (HOSPITAL_COMMUNITY): Payer: Self-pay

## 2019-02-03 ENCOUNTER — Other Ambulatory Visit: Payer: Self-pay

## 2019-02-03 DIAGNOSIS — I6523 Occlusion and stenosis of bilateral carotid arteries: Secondary | ICD-10-CM | POA: Diagnosis not present

## 2019-02-03 DIAGNOSIS — C07 Malignant neoplasm of parotid gland: Secondary | ICD-10-CM

## 2019-02-03 DIAGNOSIS — C4A4 Merkel cell carcinoma of scalp and neck: Secondary | ICD-10-CM | POA: Diagnosis not present

## 2019-02-03 DIAGNOSIS — C4A9 Merkel cell carcinoma, unspecified: Secondary | ICD-10-CM | POA: Diagnosis not present

## 2019-02-03 HISTORY — DX: Malignant (primary) neoplasm, unspecified: C80.1

## 2019-02-03 MED ORDER — IOHEXOL 300 MG/ML  SOLN
75.0000 mL | Freq: Once | INTRAMUSCULAR | Status: AC | PRN
  Administered 2019-02-03: 75 mL via INTRAVENOUS

## 2019-02-03 MED ORDER — SODIUM CHLORIDE (PF) 0.9 % IJ SOLN
INTRAMUSCULAR | Status: AC
  Filled 2019-02-03: qty 50

## 2019-02-04 ENCOUNTER — Other Ambulatory Visit: Payer: Self-pay | Admitting: Radiation Oncology

## 2019-02-04 ENCOUNTER — Ambulatory Visit
Admission: RE | Admit: 2019-02-04 | Discharge: 2019-02-04 | Disposition: A | Discharge status: Home / Self Care | Payer: Medicare HMO | Source: Ambulatory Visit | Attending: Radiation Oncology | Admitting: Radiation Oncology

## 2019-02-04 ENCOUNTER — Encounter: Payer: Self-pay | Admitting: Radiation Oncology

## 2019-02-04 DIAGNOSIS — R278 Other lack of coordination: Secondary | ICD-10-CM | POA: Diagnosis not present

## 2019-02-04 DIAGNOSIS — C07 Malignant neoplasm of parotid gland: Secondary | ICD-10-CM

## 2019-02-04 DIAGNOSIS — R293 Abnormal posture: Secondary | ICD-10-CM | POA: Diagnosis not present

## 2019-02-04 DIAGNOSIS — M6281 Muscle weakness (generalized): Secondary | ICD-10-CM | POA: Diagnosis not present

## 2019-02-04 DIAGNOSIS — I6523 Occlusion and stenosis of bilateral carotid arteries: Secondary | ICD-10-CM

## 2019-02-04 NOTE — Progress Notes (Signed)
Follow-up note    ICD-10-CM   1. Cancer of parotid gland (HCC) C07 CT Chest Wo Contrast  2. Atherosclerosis of both carotid arteries I65.23 Ambulatory referral to Cardiovascular Surgery     I spoke with the patient by phone today - WEB EX not available at his residence. Given concerns about the COVID-19 pandemic, I offered a phone assessment with the patient to determine if coming to the clinic was necessary. The patient accepted.   Symptomatically, the patient is doing relatively well. They report external ear pain on the left side, but skin has healed well. This is not so bad if he sleeps on his right side.  I shared his CT results.  No sign of cancer. I will repeat Chest CT in late Oct and see him then, and re-refer to ENT in 26moto see Dr MBlenda Nicelyafter the pandemic settles. Will refer to cardiovascular surgery in August to discuss atherosclerosis of carotid arteries seen on CT of neck.  All questions were answered to the patient's satisfaction.  I encouraged the patient to call with any further questions.   Patient is pleased with this plan. We discussed measures to reduce the risk of infection during the COVID-19 pandemic.    This encounter was provided by phone. The patient has given verbal consent for this type of encounter. The time spent during this encounter was 10 minutes. The attendants for this meeting include SEppie Gibson and SRudean Curt  During the encounter, [facility]was [facility].  Francisco KALEYwas located at home.   -----------------------------------  SEppie Gibson MD

## 2019-02-11 DIAGNOSIS — I2510 Atherosclerotic heart disease of native coronary artery without angina pectoris: Secondary | ICD-10-CM | POA: Diagnosis not present

## 2019-02-11 DIAGNOSIS — E611 Iron deficiency: Secondary | ICD-10-CM | POA: Diagnosis not present

## 2019-02-11 DIAGNOSIS — K219 Gastro-esophageal reflux disease without esophagitis: Secondary | ICD-10-CM | POA: Diagnosis not present

## 2019-02-11 DIAGNOSIS — G40909 Epilepsy, unspecified, not intractable, without status epilepticus: Secondary | ICD-10-CM | POA: Diagnosis not present

## 2019-02-11 DIAGNOSIS — N401 Enlarged prostate with lower urinary tract symptoms: Secondary | ICD-10-CM | POA: Diagnosis not present

## 2019-03-11 DIAGNOSIS — E611 Iron deficiency: Secondary | ICD-10-CM | POA: Diagnosis not present

## 2019-03-11 DIAGNOSIS — I2510 Atherosclerotic heart disease of native coronary artery without angina pectoris: Secondary | ICD-10-CM | POA: Diagnosis not present

## 2019-03-11 DIAGNOSIS — G40909 Epilepsy, unspecified, not intractable, without status epilepticus: Secondary | ICD-10-CM | POA: Diagnosis not present

## 2019-03-11 DIAGNOSIS — N401 Enlarged prostate with lower urinary tract symptoms: Secondary | ICD-10-CM | POA: Diagnosis not present

## 2019-03-25 DIAGNOSIS — I2510 Atherosclerotic heart disease of native coronary artery without angina pectoris: Secondary | ICD-10-CM | POA: Diagnosis not present

## 2019-03-25 DIAGNOSIS — G40909 Epilepsy, unspecified, not intractable, without status epilepticus: Secondary | ICD-10-CM | POA: Diagnosis not present

## 2019-03-25 DIAGNOSIS — E611 Iron deficiency: Secondary | ICD-10-CM | POA: Diagnosis not present

## 2019-03-25 DIAGNOSIS — W19XXXA Unspecified fall, initial encounter: Secondary | ICD-10-CM | POA: Diagnosis not present

## 2019-03-25 DIAGNOSIS — N401 Enlarged prostate with lower urinary tract symptoms: Secondary | ICD-10-CM | POA: Diagnosis not present

## 2019-03-26 DIAGNOSIS — C44629 Squamous cell carcinoma of skin of left upper limb, including shoulder: Secondary | ICD-10-CM | POA: Diagnosis not present

## 2019-03-26 DIAGNOSIS — Z85828 Personal history of other malignant neoplasm of skin: Secondary | ICD-10-CM | POA: Diagnosis not present

## 2019-03-26 DIAGNOSIS — L821 Other seborrheic keratosis: Secondary | ICD-10-CM | POA: Diagnosis not present

## 2019-03-26 DIAGNOSIS — L570 Actinic keratosis: Secondary | ICD-10-CM | POA: Diagnosis not present

## 2019-04-01 ENCOUNTER — Ambulatory Visit (INDEPENDENT_AMBULATORY_CARE_PROVIDER_SITE_OTHER): Payer: Medicare HMO | Admitting: *Deleted

## 2019-04-01 DIAGNOSIS — I442 Atrioventricular block, complete: Secondary | ICD-10-CM | POA: Diagnosis not present

## 2019-04-01 DIAGNOSIS — I472 Ventricular tachycardia, unspecified: Secondary | ICD-10-CM

## 2019-04-03 LAB — CUP PACEART REMOTE DEVICE CHECK
Battery Impedance: 229 Ohm
Battery Remaining Longevity: 117 mo
Battery Voltage: 2.79 V
Brady Statistic AP VP Percent: 38 %
Brady Statistic AP VS Percent: 0 %
Brady Statistic AS VP Percent: 62 %
Brady Statistic AS VS Percent: 0 %
Date Time Interrogation Session: 20200526173115
Implantable Lead Implant Date: 20160902
Implantable Lead Implant Date: 20160902
Implantable Lead Location: 753859
Implantable Lead Location: 753860
Implantable Lead Model: 5076
Implantable Lead Model: 5076
Implantable Pulse Generator Implant Date: 20160902
Lead Channel Impedance Value: 456 Ohm
Lead Channel Impedance Value: 537 Ohm
Lead Channel Pacing Threshold Amplitude: 0.625 V
Lead Channel Pacing Threshold Amplitude: 1.125 V
Lead Channel Pacing Threshold Pulse Width: 0.4 ms
Lead Channel Pacing Threshold Pulse Width: 0.4 ms
Lead Channel Sensing Intrinsic Amplitude: 2.8 mV
Lead Channel Setting Pacing Amplitude: 1.5 V
Lead Channel Setting Pacing Amplitude: 2.25 V
Lead Channel Setting Pacing Pulse Width: 0.4 ms
Lead Channel Setting Sensing Sensitivity: 4 mV

## 2019-04-04 ENCOUNTER — Other Ambulatory Visit: Payer: Self-pay

## 2019-04-04 ENCOUNTER — Emergency Department (HOSPITAL_COMMUNITY)
Admission: EM | Admit: 2019-04-04 | Discharge: 2019-04-04 | Disposition: A | Discharge status: Home / Self Care | Payer: Medicare HMO | Attending: Emergency Medicine | Admitting: Emergency Medicine

## 2019-04-04 ENCOUNTER — Encounter (HOSPITAL_COMMUNITY): Payer: Self-pay

## 2019-04-04 ENCOUNTER — Emergency Department (HOSPITAL_COMMUNITY): Payer: Medicare HMO

## 2019-04-04 DIAGNOSIS — R05 Cough: Secondary | ICD-10-CM | POA: Diagnosis not present

## 2019-04-04 DIAGNOSIS — Z03818 Encounter for observation for suspected exposure to other biological agents ruled out: Secondary | ICD-10-CM | POA: Diagnosis not present

## 2019-04-04 DIAGNOSIS — R531 Weakness: Secondary | ICD-10-CM | POA: Diagnosis not present

## 2019-04-04 DIAGNOSIS — I469 Cardiac arrest, cause unspecified: Secondary | ICD-10-CM | POA: Diagnosis not present

## 2019-04-04 DIAGNOSIS — R0989 Other specified symptoms and signs involving the circulatory and respiratory systems: Secondary | ICD-10-CM | POA: Diagnosis not present

## 2019-04-04 DIAGNOSIS — R279 Unspecified lack of coordination: Secondary | ICD-10-CM | POA: Diagnosis not present

## 2019-04-04 DIAGNOSIS — F17220 Nicotine dependence, chewing tobacco, uncomplicated: Secondary | ICD-10-CM | POA: Insufficient documentation

## 2019-04-04 DIAGNOSIS — J449 Chronic obstructive pulmonary disease, unspecified: Secondary | ICD-10-CM | POA: Insufficient documentation

## 2019-04-04 DIAGNOSIS — Z20828 Contact with and (suspected) exposure to other viral communicable diseases: Secondary | ICD-10-CM | POA: Insufficient documentation

## 2019-04-04 DIAGNOSIS — R0902 Hypoxemia: Secondary | ICD-10-CM | POA: Diagnosis not present

## 2019-04-04 DIAGNOSIS — I110 Hypertensive heart disease with heart failure: Secondary | ICD-10-CM | POA: Insufficient documentation

## 2019-04-04 DIAGNOSIS — J22 Unspecified acute lower respiratory infection: Secondary | ICD-10-CM | POA: Diagnosis not present

## 2019-04-04 DIAGNOSIS — Z743 Need for continuous supervision: Secondary | ICD-10-CM | POA: Diagnosis not present

## 2019-04-04 DIAGNOSIS — Z79899 Other long term (current) drug therapy: Secondary | ICD-10-CM | POA: Diagnosis not present

## 2019-04-04 DIAGNOSIS — R5383 Other fatigue: Secondary | ICD-10-CM | POA: Diagnosis not present

## 2019-04-04 DIAGNOSIS — Z209 Contact with and (suspected) exposure to unspecified communicable disease: Secondary | ICD-10-CM | POA: Diagnosis not present

## 2019-04-04 DIAGNOSIS — I959 Hypotension, unspecified: Secondary | ICD-10-CM | POA: Diagnosis not present

## 2019-04-04 DIAGNOSIS — R0602 Shortness of breath: Secondary | ICD-10-CM | POA: Diagnosis present

## 2019-04-04 DIAGNOSIS — I5032 Chronic diastolic (congestive) heart failure: Secondary | ICD-10-CM | POA: Insufficient documentation

## 2019-04-04 DIAGNOSIS — Z950 Presence of cardiac pacemaker: Secondary | ICD-10-CM | POA: Insufficient documentation

## 2019-04-04 DIAGNOSIS — R5381 Other malaise: Secondary | ICD-10-CM | POA: Diagnosis not present

## 2019-04-04 DIAGNOSIS — F17210 Nicotine dependence, cigarettes, uncomplicated: Secondary | ICD-10-CM | POA: Insufficient documentation

## 2019-04-04 DIAGNOSIS — J189 Pneumonia, unspecified organism: Secondary | ICD-10-CM | POA: Insufficient documentation

## 2019-04-04 DIAGNOSIS — R079 Chest pain, unspecified: Secondary | ICD-10-CM | POA: Diagnosis not present

## 2019-04-04 HISTORY — DX: Heart failure, unspecified: I50.9

## 2019-04-04 LAB — CBC WITH DIFFERENTIAL/PLATELET
Abs Immature Granulocytes: 0.02 10*3/uL (ref 0.00–0.07)
Basophils Absolute: 0 10*3/uL (ref 0.0–0.1)
Basophils Relative: 0 %
Eosinophils Absolute: 0 10*3/uL (ref 0.0–0.5)
Eosinophils Relative: 0 %
HCT: 36.1 % — ABNORMAL LOW (ref 39.0–52.0)
Hemoglobin: 12.4 g/dL — ABNORMAL LOW (ref 13.0–17.0)
Immature Granulocytes: 0 %
Lymphocytes Relative: 6 %
Lymphs Abs: 0.5 10*3/uL — ABNORMAL LOW (ref 0.7–4.0)
MCH: 30.2 pg (ref 26.0–34.0)
MCHC: 34.3 g/dL (ref 30.0–36.0)
MCV: 88 fL (ref 80.0–100.0)
Monocytes Absolute: 0.5 10*3/uL (ref 0.1–1.0)
Monocytes Relative: 7 %
Neutro Abs: 6.8 10*3/uL (ref 1.7–7.7)
Neutrophils Relative %: 87 %
Platelets: 220 10*3/uL (ref 150–400)
RBC: 4.1 MIL/uL — ABNORMAL LOW (ref 4.22–5.81)
RDW: 14.6 % (ref 11.5–15.5)
WBC: 7.9 10*3/uL (ref 4.0–10.5)
nRBC: 0 % (ref 0.0–0.2)

## 2019-04-04 LAB — URINALYSIS, ROUTINE W REFLEX MICROSCOPIC
Bilirubin Urine: NEGATIVE
Glucose, UA: NEGATIVE mg/dL
Hgb urine dipstick: NEGATIVE
Ketones, ur: NEGATIVE mg/dL
Leukocytes,Ua: NEGATIVE
Nitrite: NEGATIVE
Protein, ur: NEGATIVE mg/dL
Specific Gravity, Urine: 1.014 (ref 1.005–1.030)
pH: 5 (ref 5.0–8.0)

## 2019-04-04 LAB — COMPREHENSIVE METABOLIC PANEL
ALT: 14 U/L (ref 0–44)
AST: 28 U/L (ref 15–41)
Albumin: 3.5 g/dL (ref 3.5–5.0)
Alkaline Phosphatase: 78 U/L (ref 38–126)
Anion gap: 10 (ref 5–15)
BUN: 17 mg/dL (ref 8–23)
CO2: 24 mmol/L (ref 22–32)
Calcium: 8.4 mg/dL — ABNORMAL LOW (ref 8.9–10.3)
Chloride: 95 mmol/L — ABNORMAL LOW (ref 98–111)
Creatinine, Ser: 1.51 mg/dL — ABNORMAL HIGH (ref 0.61–1.24)
GFR calc Af Amer: 52 mL/min — ABNORMAL LOW (ref >60)
GFR calc non Af Amer: 45 mL/min — ABNORMAL LOW (ref >60)
Glucose, Bld: 98 mg/dL (ref 70–99)
Potassium: 3.4 mmol/L — ABNORMAL LOW (ref 3.5–5.1)
Sodium: 129 mmol/L — ABNORMAL LOW (ref 135–145)
Total Bilirubin: 0.2 mg/dL — ABNORMAL LOW (ref 0.3–1.2)
Total Protein: 7.3 g/dL (ref 6.5–8.1)

## 2019-04-04 LAB — FERRITIN: Ferritin: 942 ng/mL — ABNORMAL HIGH (ref 24–336)

## 2019-04-04 LAB — PROCALCITONIN: Procalcitonin: 0.28 ng/mL

## 2019-04-04 LAB — LACTATE DEHYDROGENASE: LDH: 140 U/L (ref 98–192)

## 2019-04-04 LAB — TRIGLYCERIDES: Triglycerides: 111 mg/dL (ref <150)

## 2019-04-04 LAB — C-REACTIVE PROTEIN: CRP: 16.4 mg/dL — ABNORMAL HIGH (ref <1.0)

## 2019-04-04 LAB — FIBRINOGEN: Fibrinogen: 736 mg/dL — ABNORMAL HIGH (ref 210–475)

## 2019-04-04 LAB — LACTIC ACID, PLASMA: Lactic Acid, Venous: 1.2 mmol/L (ref 0.5–1.9)

## 2019-04-04 LAB — D-DIMER, QUANTITATIVE: D-Dimer, Quant: 1.25 ug/mL-FEU — ABNORMAL HIGH (ref 0.00–0.50)

## 2019-04-04 LAB — SARS CORONAVIRUS 2 BY RT PCR (HOSPITAL ORDER, PERFORMED IN ~~LOC~~ HOSPITAL LAB): SARS Coronavirus 2: NEGATIVE

## 2019-04-04 MED ORDER — DOXYCYCLINE HYCLATE 100 MG PO TABS
100.0000 mg | ORAL_TABLET | Freq: Once | ORAL | Status: AC
  Administered 2019-04-04: 14:00:00 100 mg via ORAL
  Filled 2019-04-04: qty 1

## 2019-04-04 MED ORDER — LEVOFLOXACIN 500 MG PO TABS: 500.0000 mg | ORAL_TABLET | Freq: Every day | ORAL | 0 refills | Status: DC

## 2019-04-04 MED ORDER — SODIUM CHLORIDE 0.9 % IV SOLN
1.0000 g | Freq: Once | INTRAVENOUS | Status: AC
  Administered 2019-04-04: 1 g via INTRAVENOUS
  Filled 2019-04-04: qty 10

## 2019-04-04 MED ORDER — SODIUM CHLORIDE 0.9 % IV SOLN
1000.0000 mL | INTRAVENOUS | Status: DC
  Administered 2019-04-04: 12:00:00 1000 mL via INTRAVENOUS

## 2019-04-04 MED ORDER — SODIUM CHLORIDE 0.9 % IV BOLUS
500.0000 mL | Freq: Once | INTRAVENOUS | Status: AC
  Administered 2019-04-04: 14:00:00 500 mL via INTRAVENOUS

## 2019-04-04 MED ORDER — DEXAMETHASONE SODIUM PHOSPHATE 10 MG/ML IJ SOLN
10.0000 mg | Freq: Once | INTRAMUSCULAR | Status: AC
  Administered 2019-04-04: 16:00:00 10 mg via INTRAMUSCULAR
  Filled 2019-04-04: qty 1

## 2019-04-04 NOTE — ED Notes (Signed)
Report called to Andee Poles at [facility]

## 2019-04-04 NOTE — ED Notes (Signed)
Bed: YO45 Expected date:  Expected time:  Means of arrival:  Comments: EMS-weakness-room 25

## 2019-04-04 NOTE — ED Triage Notes (Signed)
Pt reports generalized weakness, joint pain in his legs that is worse than normal. Pt also states that he has a mild "smoker's" cough, that is normal for him.

## 2019-04-04 NOTE — ED Notes (Signed)
Pt provided with a meal tray and drink while waiting for transport

## 2019-04-04 NOTE — Discharge Instructions (Signed)
We signed the ER for evaluation of your weakness. Our work-up shows that you have a pneumonia of the lower lung field.  We had tested you for COVID-19, and the results are negative.  Please take the antibiotics prescribed. Return to the ER if your symptoms get worse. Make sure you are taking her nebulizer treatment as prescribed.

## 2019-04-04 NOTE — ED Provider Notes (Addendum)
[facility] DEPT Provider Note   CSN: [id number] Arrival date & time: 04/04/19  1053    History   Chief Complaint Chief Complaint  Patient presents with  . Joint Pain    HPI Francisco [name] is a 73 y.o. male.     HPI  73 year old male comes in with chief complaint of shortness of breath, fever and body aches.  Patient has history of CHF, COPD, hypertension. He reports that his been feeling weak for the last for 5 days.  Initially he started developing a fever with T-max of 101 with weakness.  He has had a cough and has mild shortness of breath.  He feels like his COPD is acting up slightly.  He also is complaining of some upper chest discomfort.  Patient denies any nausea, vomiting, sick contacts with anyone with COVID-19.  He also reports that the facility where he resides does not have any active COVID-19 cases.  Past Medical History:  Diagnosis Date  . Anemia   . Anginal pain ([place])    normal coronaries 2016  . Celiac disease   . CHF (congestive heart failure) ([place])   . COPD (chronic obstructive pulmonary disease) ([place])   . GERD (gastroesophageal reflux disease)   . Heart murmur   . History of radiation therapy 10/07/18- 11/04/18   Parotid, left 50 Gy in 20 fractions of 2.5 Gy.   Marland Kitchen Hypertension   . Multiple duodenal ulcers   . parotid gland cancer Left   . Presence of permanent cardiac pacemaker   . Right internal carotid occlusion   . Seizures ([place])   . Shortness of breath   . Symptomatic Bradycardia    a. 07/2015 s/p MDT Advisa L DC PPM (Ser #: [id number]).  . 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 Parotid mass 08/21/2018  . Carotid disease, bilateral ([place]) 03/20/2018  . Hyponatremia 12/02/2017  . Falls 12/01/2017  . History of alcohol abuse   . Chest  pain 08/14/2016  . Seizure ([place])   . Gout 12/17/2015  . Convulsions ([place]) 08/17/2015  . Gait instability 08/17/2015  . Alcoholism /alcohol abuse ([place]) 08/17/2015  . Cardiac device in situ, other   . Elevated troponin   . Cardiac arrest ([place])   . Complete heart block ([place])   . Seizures ([place])   . Chronic diastolic congestive heart failure ([place])   . HLD (hyperlipidemia)   . Alcohol withdrawal ([place])   . Acute renal failure syndrome ([place])   . Hypokalemia   . Hypomagnesemia   . Absolute anemia   . Hypertrophic cardiomyopathy ([place])   . Diastolic CHF, acute on chronic (HCC)   . Essential hypertension   . Alcohol withdrawal seizure ([place]) 06/29/2015  . 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 Mild diastolic dysfunction 13/24/4010  . Acute pain of right knee 06/29/2015  . Transaminitis 06/29/2015  . Acute renal failure ([place]) 06/29/2015  . High anion gap metabolic acidosis 27/25/3664  . Acute hypokalemia 06/29/2015  . Alcohol abuse, daily use 06/10/2015  . Hyperglycemia 06/09/2015  . Vitamin D deficiency 06/09/2015  . Hyperlipidemia 06/09/2015  . Bilateral lower extremity edema (chronic) 06/09/2015  . BPH (benign prostatic hypertrophy) 08/13/2014  . Multiple duodenal ulcers 07/03/2012  . Chest pain on exertion 07/02/2012  . GIB (gastrointestinal bleeding) 07/02/2012  .  GERD (gastroesophageal reflux disease)   . Heart murmur   . Microcytic anemia 06/14/2012  . Generalized weakness 06/13/2012  . Celiac disease 06/13/2012  . HTN (hypertension) 06/13/2012  . Acute hyponatremia 06/13/2012    Past Surgical History:  Procedure Laterality Date  . CARDIAC CATHETERIZATION N/A 07/08/2015   Procedure: Left Heart Cath and Coronary Angiography;  Surgeon: Troy Sine, MD;  Location: [facility];  Service: Cardiovascular;  Laterality: N/A;  . CARDIAC CATHETERIZATION N/A 07/08/2015   Procedure: Temporary Pacemaker;  Surgeon: Troy Sine, MD;  Location: [facility];  Service: Cardiovascular;  Laterality: N/A;  . CARDIAC CATHETERIZATION N/A 07/09/2015   Procedure: Temporary Pacemaker;  Surgeon: Troy Sine, MD;  Location: [facility];  Service: Cardiovascular;  Laterality: N/A;  . EP IMPLANTABLE DEVICE N/A 07/09/2015   Procedure: Pacemaker Implant;  Surgeon: Will Meredith Leeds, MD;  Location: [facility];  Service: Cardiovascular;  Laterality: N/A;  . ESOPHAGOGASTRODUODENOSCOPY  07/03/2012   Procedure: ESOPHAGOGASTRODUODENOSCOPY (EGD);  Surgeon: Winfield Cunas., MD;  Location[facility];  Service: Endoscopy;  Laterality: N/A;  . HEMORRHOID SURGERY    . HERNIA REPAIR    . MOLE REMOVAL    . PAROTIDECTOMY Left 08/21/2018   superficial  . PAROTIDECTOMY Left 08/21/2018   Procedure: LEFT SUPERFICIAL PAROTIDECTOMY;  Surgeon: Helayne Seminole, MD;  Location: [facility];  Service: ENT;  Laterality: Left;        Home Medications    Prior to Admission medications   Medication Sig Start Date End Date Taking? Authorizing Provider  allopurinol (ZYLOPRIM) 300 MG tablet TAKE 1 TABLET(300 MG) BY MOUTH DAILY Patient taking differently: Take 300 mg by mouth daily.  05/30/18  Yes Orlena Sheldon, PA-C  alum & mag hydroxide-[facility]) 200-200-20 MG/5ML suspension Take 30 mLs by mouth as needed for indigestion or heartburn.   Yes [provider]  atorvastatin (LIPITOR) 20 MG tablet Take 1 tablet (20 mg total) by mouth daily. 11/23/17  Yes [place], Modena Nunnery, MD  colchicine 0.6 MG tablet Take 1 tablet (0.6 mg total) by mouth 2 (two) times daily as needed (gout flare). Take twice daily for 2 weeks, then use as needed for gout flare up 12/04/17  Yes Eugenie Filler, MD  furosemide (LASIX) 20 MG tablet TAKE 1 TABLET BY MOUTH DAILY Patient taking differently: Take 20 mg by mouth daily.  01/23/18  Yes Dailey, Modena Nunnery, MD  guaiFENesin (MUCINEX) 600 MG 12 hr tablet Take 600-1,200 mg by mouth every 12 (twelve) hours as needed for  cough.    Yes [provider]  guaiFENesin (ROBITUSSIN) 100 MG/5ML liquid Take 200 mg by mouth every 6 (six) hours as needed for cough.    Yes [provider]  levETIRAcetam (KEPPRA) 750 MG tablet Take 1,500 mg by mouth daily. 02/17/19  Yes [provider]  lisinopril (PRINIVIL,ZESTRIL) 20 MG tablet TAKE 1 TABLET(20 MG) BY MOUTH DAILY Patient taking differently: Take 20 mg by mouth daily.  06/03/18  Yes Dena Billet B, PA-C  loperamide (IMODIUM A-D) 2 MG tablet Take 2 mg by mouth as needed for diarrhea or loose stools.    Yes [provider]  magnesium hydroxide (MILK OF MAGNESIA) 400 MG/5ML suspension Take 30 mLs by mouth at bedtime as needed for mild constipation.   Yes [provider]  metoprolol tartrate (LOPRESSOR) 50 MG tablet Take 1.5 tablets (75 mg total) by mouth 2 (two) times daily. 02/05/18 04/04/19  Yes Camnitz, Will Hassell Done, MD  naproxen sodium (ALEVE) 220 MG tablet Take 220 mg by mouth every 12 (twelve) hours as needed (joint pain).   Yes [provider]  neomycin-bacitracin-polymyxin (NEOSPORIN) ointment Apply 1 application topically as needed for wound care.   Yes [provider]  tamsulosin (FLOMAX) 0.4 MG CAPS capsule TAKE 1 CAPSULE(0.4 MG) BY MOUTH DAILY Patient taking differently: Take 0.4 mg by mouth daily.  05/30/18  Yes Orlena Sheldon, PA-C  vitamin B-12 (CYANOCOBALAMIN) 500 MCG tablet Take 500 mcg by mouth daily.   Yes [provider]  amLODipine (NORVASC) 5 MG tablet TAKE 1 TABLET(5 MG) BY MOUTH DAILY Patient not taking: No sig reported 05/30/18   Dena Billet B, PA-C  clotrimazole-betamethasone (LOTRISONE) cream APPLY EXTERNALLY TO THE AFFECTED AREA TWICE DAILY Patient not taking: No sig reported 03/22/17   Alycia Rossetti, MD  ferrous sulfate 325 (65 FE) MG tablet Take 1 tablet (325 mg total) by mouth daily. Patient not taking: Reported on 04/04/2019 12/04/17   Eugenie Filler, MD  folic acid (FOLVITE) 1 MG  tablet Take 1 tablet (1 mg total) by mouth daily. Patient not taking: Reported on 04/04/2019 12/05/17   Eugenie Filler, MD  HYDROcodone-acetaminophen (NORCO/VICODIN) 5-325 MG tablet Take 1 tablet by mouth every 4 (four) hours as needed for moderate pain. Patient not taking: Reported on 04/04/2019 08/21/18 08/21/19  Helayne Seminole, MD  levofloxacin (LEVAQUIN) 500 MG tablet Take 1 tablet (500 mg total) by mouth daily. 04/04/19   Varney Biles, MD  lidocaine (XYLOCAINE) 2 % solution Patient: Mix 1part 2% viscous lidocaine, 1part H20. Swish & swallow 72m of diluted mixture, 276m before meals and at bedtime, up to QID Patient not taking: Reported on 04/04/2019 10/21/18   SqEppie GibsonMD  Multiple Vitamin (MULTIVITAMIN WITH MINERALS) TABS tablet Take 1 tablet by mouth daily. Patient not taking: Reported on 04/04/2019 12/05/17   ThEugenie FillerMD  neomycin-colistin-hydrocortisone-thonzonium (CORTISPORIN-TC) 3.01-06-09-0.5 MG/ML OTIC suspension Place 3 drops into the left ear 4 (four) times daily. Patient not taking: Reported on 04/04/2019 11/15/18   SqEppie GibsonMD  nystatin (MYCOSTATIN) 100000 UNIT/ML suspension Take 5 mLs (500,000 Units total) by mouth 4 (four) times daily. Swish and gargle in mouth for a minute, then swallow. This should help thrush in mouth. Use QID until bottle is empty. Patient not taking: Reported on 04/04/2019 10/21/18   SqEppie GibsonMD  pantoprazole (PROTONIX) 40 MG tablet TAKE 1 TABLET BY MOUTH TWICE DAILY BEFORE A MEAL Patient not taking: No sig reported 05/30/18   DiDena Billet, PA-C  polyethylene glycol (MIRALAX / GLYCOLAX) packet Take 17 g by mouth 2 (two) times daily. Patient not taking: Reported on 04/04/2019 12/04/17   ThEugenie FillerMD  potassium chloride (K-DUR) 10 MEQ tablet TAKE 1 TABLET(10 MEQ) BY MOUTH DAILY Patient not taking: No sig reported 05/30/18   DiOrlena SheldonPA-C  thiamine 100 MG tablet Take 1 tablet (100 mg total) by mouth daily. Patient  not taking: Reported on 04/04/2019 12/05/17   ThEugenie FillerMD    Family History Family History  Problem Relation Age of Onset  . Hodgkin's lymphoma Father     Social History Social History   Tobacco Use  . Smoking status: Current Every Day Smoker    Packs/day: 0.25    Years: 50.00    Pack years: 12.50    Types: Cigarettes  . Smokeless tobacco: Current User  . Tobacco comment: 3 cigarettes  daily- 09/24/18  Substance Use Topics  . Alcohol use: Not Currently  . Drug use: No     Allergies   Patient has no known allergies.   Review of Systems Review of Systems  Constitutional: Positive for activity change.  Respiratory: Positive for cough and shortness of breath.   Cardiovascular: Positive for chest pain.  Gastrointestinal: Negative for nausea and vomiting.  Allergic/Immunologic: Negative for immunocompromised state.  Hematological: Does not bruise/bleed easily.  All other systems reviewed and are negative.    Physical Exam Updated Vital Signs BP 135/60   Pulse (!) 59   Temp 98.3 F (36.8 C) (Oral)   Resp 17   Ht 6' 2"  (1.88 m)   Wt 79.4 kg   SpO2 100%   BMI 22.47 kg/m   Physical Exam Vitals signs and nursing note reviewed.  Constitutional:      Appearance: He is well-developed.  HENT:     Head: Atraumatic.  Neck:     Musculoskeletal: Neck supple.  Cardiovascular:     Rate and Rhythm: Normal rate.  Pulmonary:     Effort: Pulmonary effort is normal. No respiratory distress.     Breath sounds: No wheezing.  Skin:    General: Skin is warm.  Neurological:     Mental Status: He is alert and oriented to person, place, and time.      ED Treatments / Results  Labs (all labs ordered are listed, but only abnormal results are displayed) Labs Reviewed  CBC WITH DIFFERENTIAL/PLATELET - Abnormal; Notable for the following components:      Result Value   RBC 4.10 (*)    Hemoglobin 12.4 (*)    HCT 36.1 (*)    Lymphs Abs 0.5 (*)    All other  components within normal limits  COMPREHENSIVE METABOLIC PANEL - Abnormal; Notable for the following components:   Sodium 129 (*)    Potassium 3.4 (*)    Chloride 95 (*)    Creatinine, Ser 1.51 (*)    Calcium 8.4 (*)    Total Bilirubin 0.2 (*)    GFR calc non Af Amer 45 (*)    GFR calc Af Amer 52 (*)    All other components within normal limits  D-DIMER, QUANTITATIVE (NOT AT [facility]) - Abnormal; Notable for the following components:   D-Dimer, Quant 1.25 (*)    All other components within normal limits  FERRITIN - Abnormal; Notable for the following components:   Ferritin 942 (*)    All other components within normal limits  FIBRINOGEN - Abnormal; Notable for the following components:   Fibrinogen 736 (*)    All other components within normal limits  C-REACTIVE PROTEIN - Abnormal; Notable for the following components:   CRP 16.4 (*)    All other components within normal limits  SARS CORONAVIRUS 2 (HOSPITAL ORDER, [facility])  LACTIC ACID, PLASMA  PROCALCITONIN  LACTATE DEHYDROGENASE  TRIGLYCERIDES  URINALYSIS, ROUTINE W REFLEX MICROSCOPIC  LACTIC ACID, PLASMA    EKG EKG Interpretation  Date/Time:  Friday Apr 04 2019 15:22:30 EDT Ventricular Rate:  70 PR Interval:    QRS Duration: 146 QT Interval:  440 QTC Calculation: 475 R Axis:   -76 Text Interpretation:  Atrial-sensed ventricular-paced rhythm No further analysis attempted due to paced rhythm No acute changes No significant change since last tracing Confirmed by Varney Biles ([id number]) on 04/04/2019 3:31:54 PM   Radiology Dg Chest Port 1 View  Result Date: 04/04/2019 CLINICAL DATA:  Chest pain. EXAM: PORTABLE CHEST 1 VIEW COMPARISON:  02/03/2019 FINDINGS: Left chest wall pacer device is identified with leads in the right atrial appendage and right ventricle. Heart size is normal. Aortic atherosclerosis identified. New left upper lobe pulmonary opacity is identified compared with 02/03/2019.  Right lung appears clear. The visualized osseous structures are unremarkable. IMPRESSION: 1. New left upper lobe pulmonary opacity. In the acute setting this is favored to represent pneumonia. Followup PA and lateral chest X-ray is recommended in 3-4 weeks following trial of antibiotic therapy to ensure resolution and exclude underlying malignancy. Electronically Signed   By: Kerby Moors M.D.   On: 04/04/2019 12:33    Procedures Procedures (including critical care time)  Medications Ordered in ED Medications  0.9 %  sodium chloride infusion (1,000 mLs Intravenous New Bag/Given 04/04/19 1207)  dexamethasone (DECADRON) injection 10 mg (has no administration in time range)  cefTRIAXone (ROCEPHIN) 1 g in sodium chloride 0.9 % 100 mL IVPB (0 g Intravenous Stopped 04/04/19 1431)  doxycycline (VIBRA-TABS) tablet 100 mg (100 mg Oral Given 04/04/19 1401)  sodium chloride 0.9 % bolus 500 mL (0 mLs Intravenous Stopped 04/04/19 1510)     Initial Impression / Assessment and Plan / ED Course  I have reviewed the triage vital signs and the nursing notes.  Pertinent labs & imaging results that were available during my care of the patient were reviewed by me and considered in my medical decision making (see chart for details).        73 year old male comes in with chief complaint of weakness and fever.  He has history of COPD, CHF and he is paced. Our lung exam is normal.  Patient has a mild cough, some nonspecific chest discomfort.  There is been no cold exposure at his site.  We will get basic labs and urine analysis.  Chest x-ray also ordered. Clinical suspicion is low for CHF.  Patient could have a mild COPD exacerbation, but the exam is not revealing any poor aeration over increased wheezing.  3:38 PM Patient's COVID-19 test is negative.  Chest x-ray showing slight opacity in the lower lung field, consistent with a possible pneumonia.  We have given him ceftriaxone and doxycycline here.  We will  send him home with Levaquin. He will also get IM Decadron.  I do not see any reason to give him on standing prednisone at this time, as it does not appear that he has COPD exacerbation.  The patient appears reasonably screened and/or stabilized for discharge and I doubt any other medical condition or other [facility] requiring further screening, evaluation, or treatment in the ED at this time prior to discharge.   Results from the ER workup discussed with the patient face to face and all questions answered to the best of my ability. The patient is safe for discharge with strict return precautions.   Francisco [name] was evaluated in Emergency Department on 04/04/2019 for the symptoms described in the history of present illness. He was evaluated in the context of the global COVID-19 pandemic, which necessitated consideration that the patient might be at risk for infection with the SARS-CoV-2 virus that causes COVID-19. Institutional protocols and algorithms that pertain to the evaluation of patients at risk for COVID-19 are in a state of rapid change based on information released by regulatory bodies including the CDC and federal and state organizations. These policies and algorithms were followed during the patient's care in the ED.   Final Clinical Impressions(s) / ED Diagnoses  Final diagnoses:  Community acquired pneumonia, unspecified laterality    ED Discharge Orders         Ordered    levofloxacin (LEVAQUIN) 500 MG tablet  Daily     04/04/19 [place], Marcelline Temkin, MD 04/04/19 1538

## 2019-04-07 DIAGNOSIS — E871 Hypo-osmolality and hyponatremia: Secondary | ICD-10-CM | POA: Diagnosis not present

## 2019-04-08 DIAGNOSIS — E611 Iron deficiency: Secondary | ICD-10-CM | POA: Diagnosis not present

## 2019-04-08 DIAGNOSIS — I2510 Atherosclerotic heart disease of native coronary artery without angina pectoris: Secondary | ICD-10-CM | POA: Diagnosis not present

## 2019-04-08 DIAGNOSIS — J188 Other pneumonia, unspecified organism: Secondary | ICD-10-CM | POA: Diagnosis not present

## 2019-04-08 DIAGNOSIS — E871 Hypo-osmolality and hyponatremia: Secondary | ICD-10-CM | POA: Diagnosis not present

## 2019-04-08 DIAGNOSIS — G40909 Epilepsy, unspecified, not intractable, without status epilepticus: Secondary | ICD-10-CM | POA: Diagnosis not present

## 2019-04-08 DIAGNOSIS — W19XXXA Unspecified fall, initial encounter: Secondary | ICD-10-CM | POA: Diagnosis not present

## 2019-04-08 DIAGNOSIS — N401 Enlarged prostate with lower urinary tract symptoms: Secondary | ICD-10-CM | POA: Diagnosis not present

## 2019-04-09 DIAGNOSIS — E871 Hypo-osmolality and hyponatremia: Secondary | ICD-10-CM | POA: Diagnosis not present

## 2019-04-09 NOTE — Progress Notes (Signed)
Remote pacemaker transmission.   

## 2019-04-10 DIAGNOSIS — E871 Hypo-osmolality and hyponatremia: Secondary | ICD-10-CM | POA: Diagnosis not present

## 2019-04-11 DIAGNOSIS — E871 Hypo-osmolality and hyponatremia: Secondary | ICD-10-CM | POA: Diagnosis not present

## 2019-04-12 DIAGNOSIS — E871 Hypo-osmolality and hyponatremia: Secondary | ICD-10-CM | POA: Diagnosis not present

## 2019-04-13 DIAGNOSIS — E871 Hypo-osmolality and hyponatremia: Secondary | ICD-10-CM | POA: Diagnosis not present

## 2019-04-14 DIAGNOSIS — E871 Hypo-osmolality and hyponatremia: Secondary | ICD-10-CM | POA: Diagnosis not present

## 2019-04-15 DIAGNOSIS — E871 Hypo-osmolality and hyponatremia: Secondary | ICD-10-CM | POA: Diagnosis not present

## 2019-04-16 DIAGNOSIS — E871 Hypo-osmolality and hyponatremia: Secondary | ICD-10-CM | POA: Diagnosis not present

## 2019-04-17 DIAGNOSIS — E871 Hypo-osmolality and hyponatremia: Secondary | ICD-10-CM | POA: Diagnosis not present

## 2019-04-18 DIAGNOSIS — K219 Gastro-esophageal reflux disease without esophagitis: Secondary | ICD-10-CM | POA: Diagnosis not present

## 2019-04-18 DIAGNOSIS — I10 Essential (primary) hypertension: Secondary | ICD-10-CM | POA: Diagnosis not present

## 2019-04-18 DIAGNOSIS — R1310 Dysphagia, unspecified: Secondary | ICD-10-CM | POA: Diagnosis not present

## 2019-04-18 DIAGNOSIS — E871 Hypo-osmolality and hyponatremia: Secondary | ICD-10-CM | POA: Diagnosis not present

## 2019-04-18 DIAGNOSIS — Z720 Tobacco use: Secondary | ICD-10-CM | POA: Diagnosis not present

## 2019-04-18 DIAGNOSIS — E663 Overweight: Secondary | ICD-10-CM | POA: Diagnosis not present

## 2019-04-19 DIAGNOSIS — E871 Hypo-osmolality and hyponatremia: Secondary | ICD-10-CM | POA: Diagnosis not present

## 2019-04-20 DIAGNOSIS — E871 Hypo-osmolality and hyponatremia: Secondary | ICD-10-CM | POA: Diagnosis not present

## 2019-04-30 DIAGNOSIS — Z79899 Other long term (current) drug therapy: Secondary | ICD-10-CM | POA: Diagnosis not present

## 2019-04-30 DIAGNOSIS — I10 Essential (primary) hypertension: Secondary | ICD-10-CM | POA: Diagnosis not present

## 2019-04-30 DIAGNOSIS — G40909 Epilepsy, unspecified, not intractable, without status epilepticus: Secondary | ICD-10-CM | POA: Diagnosis not present

## 2019-05-07 DIAGNOSIS — E871 Hypo-osmolality and hyponatremia: Secondary | ICD-10-CM | POA: Diagnosis not present

## 2019-05-08 DIAGNOSIS — E871 Hypo-osmolality and hyponatremia: Secondary | ICD-10-CM | POA: Diagnosis not present

## 2019-05-09 DIAGNOSIS — E871 Hypo-osmolality and hyponatremia: Secondary | ICD-10-CM | POA: Diagnosis not present

## 2019-05-10 DIAGNOSIS — E871 Hypo-osmolality and hyponatremia: Secondary | ICD-10-CM | POA: Diagnosis not present

## 2019-05-11 DIAGNOSIS — E871 Hypo-osmolality and hyponatremia: Secondary | ICD-10-CM | POA: Diagnosis not present

## 2019-05-12 DIAGNOSIS — E871 Hypo-osmolality and hyponatremia: Secondary | ICD-10-CM | POA: Diagnosis not present

## 2019-05-13 DIAGNOSIS — E871 Hypo-osmolality and hyponatremia: Secondary | ICD-10-CM | POA: Diagnosis not present

## 2019-05-14 ENCOUNTER — Telehealth: Payer: Self-pay | Admitting: *Deleted

## 2019-05-14 DIAGNOSIS — E871 Hypo-osmolality and hyponatremia: Secondary | ICD-10-CM | POA: Diagnosis not present

## 2019-05-14 NOTE — Telephone Encounter (Signed)
Called patient to inform of appt. with Dr. Blenda Nicely on 05-26-19 - arrival time- 9 am,, spoke with patient and he is aware of this appt.

## 2019-05-15 DIAGNOSIS — E871 Hypo-osmolality and hyponatremia: Secondary | ICD-10-CM | POA: Diagnosis not present

## 2019-05-16 DIAGNOSIS — G40909 Epilepsy, unspecified, not intractable, without status epilepticus: Secondary | ICD-10-CM | POA: Diagnosis not present

## 2019-05-16 DIAGNOSIS — Z79899 Other long term (current) drug therapy: Secondary | ICD-10-CM | POA: Diagnosis not present

## 2019-05-16 DIAGNOSIS — E871 Hypo-osmolality and hyponatremia: Secondary | ICD-10-CM | POA: Diagnosis not present

## 2019-05-16 DIAGNOSIS — Z0389 Encounter for observation for other suspected diseases and conditions ruled out: Secondary | ICD-10-CM | POA: Diagnosis not present

## 2019-05-16 DIAGNOSIS — I10 Essential (primary) hypertension: Secondary | ICD-10-CM | POA: Diagnosis not present

## 2019-05-17 DIAGNOSIS — E871 Hypo-osmolality and hyponatremia: Secondary | ICD-10-CM | POA: Diagnosis not present

## 2019-05-18 DIAGNOSIS — E871 Hypo-osmolality and hyponatremia: Secondary | ICD-10-CM | POA: Diagnosis not present

## 2019-05-20 DIAGNOSIS — E611 Iron deficiency: Secondary | ICD-10-CM | POA: Diagnosis not present

## 2019-05-20 DIAGNOSIS — R2681 Unsteadiness on feet: Secondary | ICD-10-CM | POA: Diagnosis not present

## 2019-05-20 DIAGNOSIS — G40909 Epilepsy, unspecified, not intractable, without status epilepticus: Secondary | ICD-10-CM | POA: Diagnosis not present

## 2019-05-20 DIAGNOSIS — N401 Enlarged prostate with lower urinary tract symptoms: Secondary | ICD-10-CM | POA: Diagnosis not present

## 2019-05-20 DIAGNOSIS — I2510 Atherosclerotic heart disease of native coronary artery without angina pectoris: Secondary | ICD-10-CM | POA: Diagnosis not present

## 2019-05-20 DIAGNOSIS — S51811A Laceration without foreign body of right forearm, initial encounter: Secondary | ICD-10-CM | POA: Diagnosis not present

## 2019-05-26 DIAGNOSIS — K08109 Complete loss of teeth, unspecified cause, unspecified class: Secondary | ICD-10-CM | POA: Diagnosis not present

## 2019-05-26 DIAGNOSIS — F17200 Nicotine dependence, unspecified, uncomplicated: Secondary | ICD-10-CM | POA: Diagnosis not present

## 2019-05-26 DIAGNOSIS — Z9089 Acquired absence of other organs: Secondary | ICD-10-CM | POA: Diagnosis not present

## 2019-05-26 DIAGNOSIS — Z7289 Other problems related to lifestyle: Secondary | ICD-10-CM | POA: Diagnosis not present

## 2019-05-26 DIAGNOSIS — H6123 Impacted cerumen, bilateral: Secondary | ICD-10-CM | POA: Diagnosis not present

## 2019-05-26 DIAGNOSIS — Z923 Personal history of irradiation: Secondary | ICD-10-CM | POA: Diagnosis not present

## 2019-05-26 DIAGNOSIS — Z8509 Personal history of malignant neoplasm of other digestive organs: Secondary | ICD-10-CM | POA: Diagnosis not present

## 2019-05-26 DIAGNOSIS — Z9889 Other specified postprocedural states: Secondary | ICD-10-CM | POA: Diagnosis not present

## 2019-06-13 DIAGNOSIS — G40909 Epilepsy, unspecified, not intractable, without status epilepticus: Secondary | ICD-10-CM | POA: Diagnosis not present

## 2019-06-13 DIAGNOSIS — I10 Essential (primary) hypertension: Secondary | ICD-10-CM | POA: Diagnosis not present

## 2019-06-13 DIAGNOSIS — Z79899 Other long term (current) drug therapy: Secondary | ICD-10-CM | POA: Diagnosis not present

## 2019-07-02 ENCOUNTER — Ambulatory Visit (INDEPENDENT_AMBULATORY_CARE_PROVIDER_SITE_OTHER): Payer: Medicare HMO | Admitting: *Deleted

## 2019-07-02 DIAGNOSIS — I442 Atrioventricular block, complete: Secondary | ICD-10-CM

## 2019-07-02 LAB — CUP PACEART REMOTE DEVICE CHECK
Battery Impedance: 230 Ohm
Battery Remaining Longevity: 121 mo
Battery Voltage: 2.79 V
Brady Statistic AP VP Percent: 41 %
Brady Statistic AP VS Percent: 0 %
Brady Statistic AS VP Percent: 59 %
Brady Statistic AS VS Percent: 0 %
Date Time Interrogation Session: 20200826131215
Implantable Lead Implant Date: 20160902
Implantable Lead Implant Date: 20160902
Implantable Lead Location: 753859
Implantable Lead Location: 753860
Implantable Lead Model: 5076
Implantable Lead Model: 5076
Implantable Pulse Generator Implant Date: 20160902
Lead Channel Impedance Value: 486 Ohm
Lead Channel Impedance Value: 563 Ohm
Lead Channel Pacing Threshold Amplitude: 0.75 V
Lead Channel Pacing Threshold Amplitude: 1 V
Lead Channel Pacing Threshold Pulse Width: 0.4 ms
Lead Channel Pacing Threshold Pulse Width: 0.4 ms
Lead Channel Setting Pacing Amplitude: 1.5 V
Lead Channel Setting Pacing Amplitude: 2 V
Lead Channel Setting Pacing Pulse Width: 0.4 ms
Lead Channel Setting Sensing Sensitivity: 4 mV

## 2019-07-04 DIAGNOSIS — F17210 Nicotine dependence, cigarettes, uncomplicated: Secondary | ICD-10-CM | POA: Diagnosis not present

## 2019-07-04 DIAGNOSIS — I10 Essential (primary) hypertension: Secondary | ICD-10-CM | POA: Diagnosis not present

## 2019-07-04 DIAGNOSIS — R2681 Unsteadiness on feet: Secondary | ICD-10-CM | POA: Diagnosis not present

## 2019-07-04 DIAGNOSIS — G40909 Epilepsy, unspecified, not intractable, without status epilepticus: Secondary | ICD-10-CM | POA: Diagnosis not present

## 2019-07-10 ENCOUNTER — Encounter: Payer: Self-pay | Admitting: Cardiology

## 2019-07-10 NOTE — Progress Notes (Signed)
Remote pacemaker transmission.   

## 2019-07-15 DIAGNOSIS — E611 Iron deficiency: Secondary | ICD-10-CM | POA: Diagnosis not present

## 2019-07-15 DIAGNOSIS — I2510 Atherosclerotic heart disease of native coronary artery without angina pectoris: Secondary | ICD-10-CM | POA: Diagnosis not present

## 2019-07-15 DIAGNOSIS — G40909 Epilepsy, unspecified, not intractable, without status epilepticus: Secondary | ICD-10-CM | POA: Diagnosis not present

## 2019-07-15 DIAGNOSIS — W19XXXA Unspecified fall, initial encounter: Secondary | ICD-10-CM | POA: Diagnosis not present

## 2019-07-15 DIAGNOSIS — N401 Enlarged prostate with lower urinary tract symptoms: Secondary | ICD-10-CM | POA: Diagnosis not present

## 2019-07-15 DIAGNOSIS — S0101XA Laceration without foreign body of scalp, initial encounter: Secondary | ICD-10-CM | POA: Diagnosis not present

## 2019-08-08 DIAGNOSIS — G40909 Epilepsy, unspecified, not intractable, without status epilepticus: Secondary | ICD-10-CM | POA: Diagnosis not present

## 2019-08-08 DIAGNOSIS — I10 Essential (primary) hypertension: Secondary | ICD-10-CM | POA: Diagnosis not present

## 2019-08-08 DIAGNOSIS — F17210 Nicotine dependence, cigarettes, uncomplicated: Secondary | ICD-10-CM | POA: Diagnosis not present

## 2019-08-12 DIAGNOSIS — Z20828 Contact with and (suspected) exposure to other viral communicable diseases: Secondary | ICD-10-CM | POA: Diagnosis not present

## 2019-08-19 DIAGNOSIS — Z20828 Contact with and (suspected) exposure to other viral communicable diseases: Secondary | ICD-10-CM | POA: Diagnosis not present

## 2019-08-25 DIAGNOSIS — Z20828 Contact with and (suspected) exposure to other viral communicable diseases: Secondary | ICD-10-CM | POA: Diagnosis not present

## 2019-08-28 NOTE — Progress Notes (Signed)
duplicate

## 2019-09-01 ENCOUNTER — Other Ambulatory Visit: Payer: Medicare HMO

## 2019-09-01 DIAGNOSIS — Z20828 Contact with and (suspected) exposure to other viral communicable diseases: Secondary | ICD-10-CM | POA: Diagnosis not present

## 2019-09-02 ENCOUNTER — Ambulatory Visit
Admission: RE | Admit: 2019-09-02 | Discharge: 2019-09-02 | Disposition: A | Discharge status: Home / Self Care | Payer: Medicare HMO | Source: Ambulatory Visit | Attending: Radiation Oncology | Admitting: Radiation Oncology

## 2019-09-03 ENCOUNTER — Other Ambulatory Visit: Payer: Medicare HMO

## 2019-09-03 NOTE — Progress Notes (Signed)
error 

## 2019-09-05 ENCOUNTER — Ambulatory Visit
Admission: RE | Admit: 2019-09-05 | Discharge: 2019-09-05 | Disposition: A | Discharge status: Home / Self Care | Payer: Medicare HMO | Source: Ambulatory Visit | Attending: Radiation Oncology | Admitting: Radiation Oncology

## 2019-09-05 ENCOUNTER — Telehealth: Payer: Self-pay | Admitting: *Deleted

## 2019-09-05 DIAGNOSIS — R269 Unspecified abnormalities of gait and mobility: Secondary | ICD-10-CM | POA: Diagnosis not present

## 2019-09-05 DIAGNOSIS — F17210 Nicotine dependence, cigarettes, uncomplicated: Secondary | ICD-10-CM | POA: Diagnosis not present

## 2019-09-05 DIAGNOSIS — G40909 Epilepsy, unspecified, not intractable, without status epilepticus: Secondary | ICD-10-CM | POA: Diagnosis not present

## 2019-09-05 DIAGNOSIS — Z7409 Other reduced mobility: Secondary | ICD-10-CM | POA: Diagnosis not present

## 2019-09-05 DIAGNOSIS — I10 Essential (primary) hypertension: Secondary | ICD-10-CM | POA: Diagnosis not present

## 2019-09-05 NOTE — Telephone Encounter (Signed)
CALLED PATIENT AND TRANSPO. PERSON FOR THIS PATIENT TO INFORM OF CT FOR 09-18-19 - ARRIVAL TIME- 10:10 AM @ [facility] ([place] NO RESTRICTIONS TO TEST, PATIENT TO FU WITH DR. Isidore Moos ON 09-19-19 @ 2:40 PM, PATIENT AND TRANSPO. PERSON ARE AWARE OF THESE APPT. DATES AND TIMES

## 2019-09-05 NOTE — Telephone Encounter (Signed)
XXXX

## 2019-09-08 DIAGNOSIS — Z20828 Contact with and (suspected) exposure to other viral communicable diseases: Secondary | ICD-10-CM | POA: Diagnosis not present

## 2019-09-08 DIAGNOSIS — Z79899 Other long term (current) drug therapy: Secondary | ICD-10-CM | POA: Diagnosis not present

## 2019-09-08 DIAGNOSIS — I10 Essential (primary) hypertension: Secondary | ICD-10-CM | POA: Diagnosis not present

## 2019-09-15 DIAGNOSIS — Z20828 Contact with and (suspected) exposure to other viral communicable diseases: Secondary | ICD-10-CM | POA: Diagnosis not present

## 2019-09-18 ENCOUNTER — Other Ambulatory Visit: Payer: Self-pay

## 2019-09-18 ENCOUNTER — Ambulatory Visit
Admission: RE | Admit: 2019-09-18 | Discharge: 2019-09-18 | Disposition: A | Discharge status: Home / Self Care | Payer: Medicare HMO | Source: Ambulatory Visit | Attending: Radiation Oncology | Admitting: Radiation Oncology

## 2019-09-18 DIAGNOSIS — R918 Other nonspecific abnormal finding of lung field: Secondary | ICD-10-CM | POA: Diagnosis not present

## 2019-09-18 DIAGNOSIS — C07 Malignant neoplasm of parotid gland: Secondary | ICD-10-CM

## 2019-09-18 NOTE — Progress Notes (Signed)
Francisco [name] presents for follow up of radiation completed 11/04/2018 to his head and neck.   Pain issues, if any: He denies.  Using a feeding tube?: No Weight changes, if any:  Wt Readings from Last 3 Encounters:  09/19/19 187 lb 3.2 oz (84.9 kg)  04/04/19 175 lb 0.7 oz (79.4 kg)  12/29/18 175 lb (79.4 kg)   Swallowing issues, if any:  He denies difficulty swallowing.  Smoking or chewing tobacco? He is smoking about 3-4 cigarettes daily.  Using fluoride trays daily? N/A Last ENT visit was on: 05/26/19 Dr. Rhina Brackett Other notable issues, if any:  He presents for CT chest results completed 09/18/19.  BP 138/61   Pulse 81   Temp 98.7 F (37.1 C) (Tympanic)   Wt 187 lb 3.2 oz (84.9 kg)   SpO2 97%   BMI 24.04 kg/m

## 2019-09-19 ENCOUNTER — Encounter: Payer: Self-pay | Admitting: Radiation Oncology

## 2019-09-19 ENCOUNTER — Other Ambulatory Visit: Payer: Self-pay

## 2019-09-19 ENCOUNTER — Ambulatory Visit
Admission: RE | Admit: 2019-09-19 | Discharge: 2019-09-19 | Disposition: A | Discharge status: Home / Self Care | Payer: Medicare HMO | Source: Ambulatory Visit | Attending: Radiation Oncology | Admitting: Radiation Oncology

## 2019-09-19 DIAGNOSIS — C07 Malignant neoplasm of parotid gland: Secondary | ICD-10-CM | POA: Insufficient documentation

## 2019-09-19 DIAGNOSIS — Z85858 Personal history of malignant neoplasm of other endocrine glands: Secondary | ICD-10-CM | POA: Diagnosis not present

## 2019-09-19 DIAGNOSIS — Z79899 Other long term (current) drug therapy: Secondary | ICD-10-CM | POA: Insufficient documentation

## 2019-09-19 DIAGNOSIS — J439 Emphysema, unspecified: Secondary | ICD-10-CM | POA: Insufficient documentation

## 2019-09-19 DIAGNOSIS — F17210 Nicotine dependence, cigarettes, uncomplicated: Secondary | ICD-10-CM | POA: Diagnosis not present

## 2019-09-19 DIAGNOSIS — R911 Solitary pulmonary nodule: Secondary | ICD-10-CM | POA: Diagnosis not present

## 2019-09-19 DIAGNOSIS — R918 Other nonspecific abnormal finding of lung field: Secondary | ICD-10-CM | POA: Insufficient documentation

## 2019-09-19 DIAGNOSIS — I700 Atherosclerosis of aorta: Secondary | ICD-10-CM | POA: Insufficient documentation

## 2019-09-19 DIAGNOSIS — Z08 Encounter for follow-up examination after completed treatment for malignant neoplasm: Secondary | ICD-10-CM | POA: Diagnosis not present

## 2019-09-19 NOTE — Progress Notes (Signed)
Radiation Oncology         [phone] ________________________________  Name: Francisco [name] MRN: [id number]  Date: 09/19/2019  DOB: 03-29-1946  Follow-Up Visit Note  CC: Lesia Hausen, PA  Helayne Seminole, MD  Diagnosis and Prior Radiotherapy:       ICD-10-CM   1. 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 He last saw Dr. Blenda Nicely on 05/26/2019, with no evidence of recurrence on clinical exam at the time.  Since his last visit, he underwent follow up cheat CT yesterday, 09/18/2019, which showed: 3 mm RML lung nodule, stable since 11/2017 and favored benign; left-sided pleural calcifications noted.  He denies respiratory symptoms.  He continues to drink 3-4 beers nightly and smoke 4x per day. He reports he is not motivated to quit.  He is doing well overall and denies new symptoms.  ALLERGIES:  has No Known Allergies.  Meds: Current Outpatient Medications  Medication Sig Dispense Refill  . allopurinol (ZYLOPRIM) 300 MG tablet TAKE 1 TABLET(300 MG) BY MOUTH DAILY (Patient taking differently: Take 300 mg by mouth daily. ) 90 tablet 0  . alum & mag hydroxide-simeth (MYLANTA) 200-200-20 MG/5ML suspension Take 30 mLs by mouth as needed for indigestion or heartburn.    Marland Kitchen atorvastatin (LIPITOR) 20 MG tablet Take 1 tablet (20 mg total) by mouth daily. 90 tablet 3  . colchicine 0.6 MG tablet Take 1 tablet (0.6 mg total) by mouth 2 (two) times daily as needed (gout flare). Take twice daily for 2 weeks, then use as needed for gout flare up 60 tablet 0  . furosemide (LASIX) 20 MG tablet TAKE 1 TABLET BY MOUTH DAILY (Patient taking differently: Take 20 mg by mouth daily. ) 90 tablet 0   . guaiFENesin (MUCINEX) 600 MG 12 hr tablet Take 600-1,200 mg by mouth every 12 (twelve) hours as needed for cough.     Marland Kitchen guaiFENesin (ROBITUSSIN) 100 MG/5ML liquid Take 200 mg by mouth every 6 (six) hours as needed for cough.     . levETIRAcetam (KEPPRA) 750 MG tablet Take 1,500 mg by mouth daily.    Marland Kitchen lisinopril (PRINIVIL,ZESTRIL) 20 MG tablet TAKE 1 TABLET(20 MG) BY MOUTH DAILY (Patient taking differently: Take 10 mg by mouth daily. ) 90 tablet 0  . magnesium hydroxide (MILK OF MAGNESIA) 400 MG/5ML suspension Take 30 mLs by mouth at bedtime as needed for mild constipation.    . Multiple Vitamin (MULTIVITAMIN WITH MINERALS) TABS tablet Take 1 tablet by mouth daily.    . naproxen sodium (ALEVE) 220 MG tablet Take 220 mg by mouth every 12 (twelve) hours as needed (joint pain).    Marland Kitchen neomycin-bacitracin-polymyxin (NEOSPORIN) ointment Apply 1 application topically as needed for wound care.    . nystatin (MYCOSTATIN) 100000 UNIT/ML suspension Take 5 mLs (500,000 Units total) by mouth 4 (four) times daily. Swish and gargle in mouth for a minute, then swallow. This should help thrush in mouth. Use QID until bottle is empty. 473 mL 0  . pantoprazole (PROTONIX) 40 MG tablet TAKE 1 TABLET BY MOUTH TWICE DAILY BEFORE A MEAL 180 tablet 0  . tamsulosin (FLOMAX) 0.4 MG CAPS capsule TAKE  1 CAPSULE(0.4 MG) BY MOUTH DAILY (Patient taking differently: Take 0.4 mg by mouth daily. ) 90 capsule 0  . vitamin B-12 (CYANOCOBALAMIN) 500 MCG tablet Take 500 mcg by mouth daily.    Marland Kitchen amLODipine (NORVASC) 5 MG tablet TAKE 1 TABLET(5 MG) BY MOUTH DAILY (Patient not taking: No sig reported) 90 tablet 0  . clotrimazole-betamethasone (LOTRISONE) cream APPLY EXTERNALLY TO THE AFFECTED AREA TWICE DAILY (Patient not taking: No sig reported) 45 g 0  . ferrous sulfate 325 (65 FE) MG tablet Take 1 tablet (325 mg total) by mouth daily. (Patient not taking: Reported on 04/04/2019)    . folic acid (FOLVITE) 1 MG tablet Take 1 tablet (1 mg  total) by mouth daily. (Patient not taking: Reported on 04/04/2019)    . levofloxacin (LEVAQUIN) 500 MG tablet Take 1 tablet (500 mg total) by mouth daily. (Patient not taking: Reported on 09/19/2019) 10 tablet 0  . lidocaine (XYLOCAINE) 2 % solution Patient: Mix 1part 2% viscous lidocaine, 1part H20. Swish & swallow 9m of diluted mixture, 242m before meals and at bedtime, up to QID (Patient not taking: Reported on 04/04/2019) 200 mL 3  . loperamide (IMODIUM A-D) 2 MG tablet Take 2 mg by mouth as needed for diarrhea or loose stools.     . metoprolol tartrate (LOPRESSOR) 50 MG tablet Take 1.5 tablets (75 mg total) by mouth 2 (two) times daily. 90 tablet 6  . neomycin-colistin-hydrocortisone-thonzonium (CORTISPORIN-TC) 3.01-06-09-0.5 MG/ML OTIC suspension Place 3 drops into the left ear 4 (four) times daily. (Patient not taking: Reported on 04/04/2019) 10 mL 0  . polyethylene glycol (MIRALAX / GLYCOLAX) packet Take 17 g by mouth 2 (two) times daily. (Patient not taking: Reported on 04/04/2019) 14 each 0  . potassium chloride (K-DUR) 10 MEQ tablet TAKE 1 TABLET(10 MEQ) BY MOUTH DAILY (Patient not taking: No sig reported) 90 tablet 0  . thiamine 100 MG tablet Take 1 tablet (100 mg total) by mouth daily. (Patient not taking: Reported on 04/04/2019)     No current facility-administered medications for this encounter.     Physical Findings: The patient is in no acute distress. Patient is alert and oriented. Wt Readings from Last 3 Encounters:  09/19/19 187 lb 3.2 oz (84.9 kg)  04/04/19 175 lb 0.7 oz (79.4 kg)  12/29/18 175 lb (79.4 kg)    weight is 187 lb 3.2 oz (84.9 kg). His tympanic temperature is 98.7 F (37.1 C). His blood pressure is 138/61 and his pulse is 81. His oxygen saturation is 97%. .   General: Alert and oriented, in no acute distress HEENT: Head is normocephalic   Oropharynx is notable for oral cavity clear.   Neck: Neck is notable for without any any palpable adenopathy in the neck or  periauricular regions Skin: Skin in treatment fields has healed well     Lab Findings: Lab Results  Component Value Date   WBC 7.9 04/04/2019   HGB 12.4 (L) 04/04/2019   HCT 36.1 (L) 04/04/2019   MCV 88.0 04/04/2019   PLT 220 04/04/2019    Lab Results  Component Value Date   TSH 1.372 11/30/2017    Radiographic Findings: Ct Chest Wo Contrast  Result Date: 09/18/2019 CLINICAL DATA:  Follow up lung nodules. EXAM: CT CHEST WITHOUT CONTRAST TECHNIQUE: Multidetector CT imaging of the chest was performed following the standard protocol without IV contrast. COMPARISON:  02/03/19 FINDINGS: Cardiovascular: The heart size appears within normal limits. No pericardial effusion identified. Aortic atherosclerosis. Coronary artery calcifications. Mediastinum/Nodes:  No enlarged mediastinal or axillary lymph nodes. Thyroid gland, trachea, and esophagus demonstrate no significant findings. Lungs/Pleura: Paraseptal emphysema. No pleural effusion, airspace consolidation or atelectasis. 3 mm right upper lobe lung nodule is identified and appears unchanged from previous exam, image 75/3. Pleural calcifications overlying the posterior left lung are again identified. Upper Abdomen: No acute abnormality. Musculoskeletal: No chest wall mass or suspicious bone lesions identified. IMPRESSION: 1. Stable 3 mm right middle lobe lung nodule. This is remains stable since 11/30/2017 and is favored to represent a benign abnormality. 2. Left-sided pleural calcifications noted. Correlate for any clinical signs or symptoms of asbestos exposure. 3. Coronary artery calcifications. Aortic Atherosclerosis (ICD10-I70.0) and Emphysema (ICD10-J43.9). Electronically Signed   By: Kerby Moors M.D.   On: 09/18/2019 10:58    Impression/Plan:   1) Head and Neck Cancer Status: No evidence of recurrence; we will review his CT scan at tumor board.  2) Nutritional Status: No active issues.   3) Thyroid function: unlikely to be affected  by ipsilateral radiotherapy.  Lab Results  Component Value Date   TSH 1.372 11/30/2017    4) Other:  Follow-up in 3 months with ENT  Gayleen Orem, RN, our Head and Neck Oncology Navigator will arrange).  F/u in 39mowith me, sooner PRN.  The patient was encouraged to call with any issues or questions before then.  Although the patient continues to drink alcohol and smoke he does not show any motivation to abstain.  We will continue to follow and help him with abstinence if and when he is motivated.  He is aware that these substances can be harmful to his health.   In a face-to-face visit lasting 10 minutes, greater than 50% of the time was spent discussing the patient's condition, and coordinating the patient's care.   _______________________________________    SEppie Gibson MD   This document serves as a record of services personally performed by SEppie Gibson MD. It was created on her behalf by KWilburn Mylar a trained medical scribe. The creation of this record is based on the scribe's personal observations and the provider's statements to them. This document has been checked and approved by the attending provider.

## 2019-09-22 ENCOUNTER — Telehealth: Payer: Self-pay | Admitting: *Deleted

## 2019-09-22 ENCOUNTER — Encounter: Payer: Self-pay | Admitting: Radiation Oncology

## 2019-09-22 NOTE — Telephone Encounter (Signed)
CALLED PATIENT'S TRANSPO (Francisco [name]) TO INFORM OF FU WITH DR. Isidore Moos ON 03-19-20 @ 2:40 PM, SPOKE WITH REP AND THEY WILL Francisco [name]  A MESSAGE

## 2019-09-26 ENCOUNTER — Telehealth: Payer: Self-pay | Admitting: *Deleted

## 2019-09-26 DIAGNOSIS — G894 Chronic pain syndrome: Secondary | ICD-10-CM | POA: Diagnosis not present

## 2019-09-26 DIAGNOSIS — I2510 Atherosclerotic heart disease of native coronary artery without angina pectoris: Secondary | ICD-10-CM | POA: Diagnosis not present

## 2019-09-26 DIAGNOSIS — G40909 Epilepsy, unspecified, not intractable, without status epilepticus: Secondary | ICD-10-CM | POA: Diagnosis not present

## 2019-09-26 DIAGNOSIS — Z7409 Other reduced mobility: Secondary | ICD-10-CM | POA: Diagnosis not present

## 2019-09-26 DIAGNOSIS — R269 Unspecified abnormalities of gait and mobility: Secondary | ICD-10-CM | POA: Diagnosis not present

## 2019-09-26 NOTE — Telephone Encounter (Signed)
Oncology Nurse Navigator Documentation  Per patient's 11/13 post-treatment follow-up with Dr. Isidore Moos, called [facility] to coordinate appointment with Dr.   Blenda Nicely.  Spoke with Anderson Malta, requested patient be contacted and scheduled for routine follow-up in 3 months. She verbalized understanding.    Gayleen Orem, RN, BSN Head & Neck Oncology Nurse [place] [facility] [place] [phone]

## 2019-10-10 DIAGNOSIS — G40909 Epilepsy, unspecified, not intractable, without status epilepticus: Secondary | ICD-10-CM | POA: Diagnosis not present

## 2019-10-10 DIAGNOSIS — I10 Essential (primary) hypertension: Secondary | ICD-10-CM | POA: Diagnosis not present

## 2019-10-10 DIAGNOSIS — R2681 Unsteadiness on feet: Secondary | ICD-10-CM | POA: Diagnosis not present

## 2019-10-10 DIAGNOSIS — Z7409 Other reduced mobility: Secondary | ICD-10-CM | POA: Diagnosis not present

## 2019-10-16 DIAGNOSIS — G894 Chronic pain syndrome: Secondary | ICD-10-CM | POA: Diagnosis not present

## 2019-10-16 DIAGNOSIS — G40909 Epilepsy, unspecified, not intractable, without status epilepticus: Secondary | ICD-10-CM | POA: Diagnosis not present

## 2019-10-16 DIAGNOSIS — R269 Unspecified abnormalities of gait and mobility: Secondary | ICD-10-CM | POA: Diagnosis not present

## 2019-10-21 IMAGING — CT CT CERVICAL SPINE W/O CM
3 of 12 series · 7 of 33 positions shown, 8 images · non-contrast
Comparison: 08/15/2016 head CT and 11/15/2017 neck CT

CLINICAL DATA: Patient fell yesterday.  Posttraumatic headache.

EXAM:
CT HEAD WITHOUT CONTRAST
CT CERVICAL SPINE WITHOUT CONTRAST
TECHNIQUE: Multidetector CT imaging of the head and cervical spine was
performed following the standard protocol without intravenous
contrast. Multiplanar CT image reconstructions of the cervical spine
were also generated.

[Series 10: c spine soft · axial · 0.32mm/px · z∈[-256,-202]mm · 2 of 81 slices shown]
[im 27/81  soft-tissue]
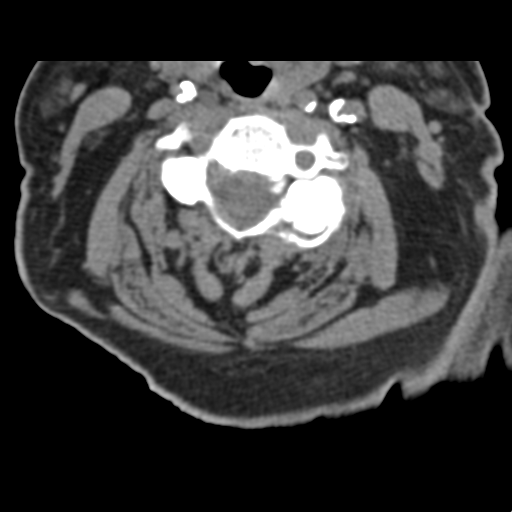
[im 54/81  soft-tissue]
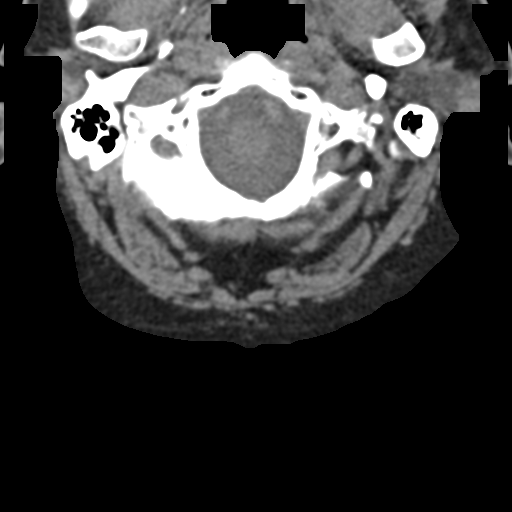

[Series 12: orthogonal bone · axial · 0.23mm/px · z∈[-294,-235]mm · 2 of 94 slices shown, 3 images]
[im 32/94  soft-tissue]
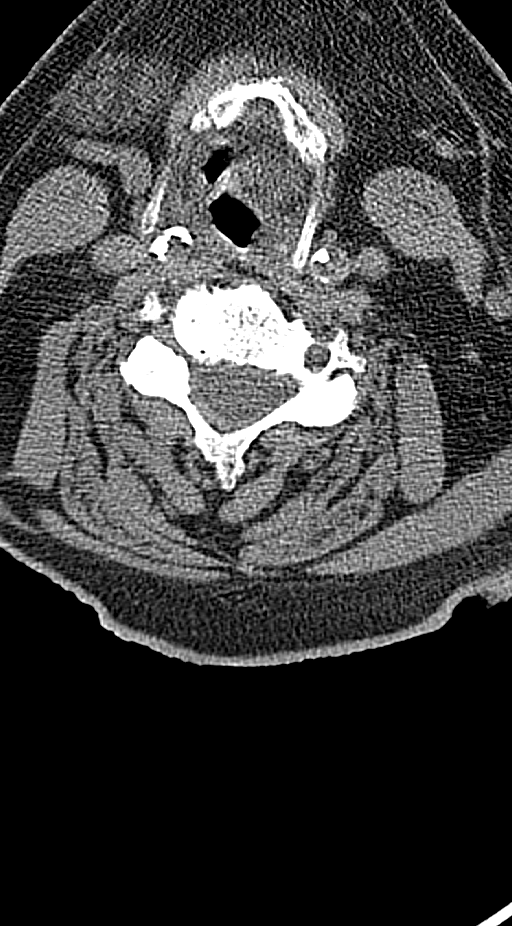
[im 32/94  bone]
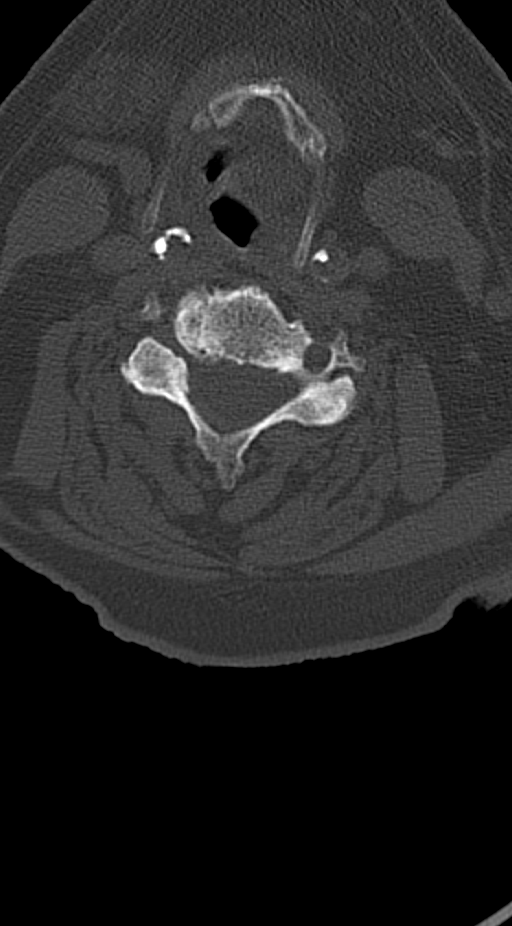
[im 63/94  bone]
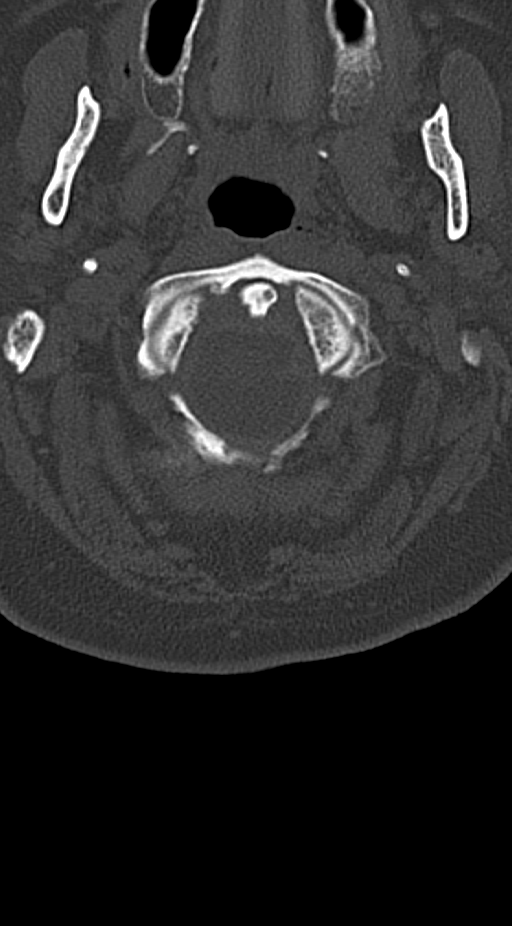

[Series 20: sagittal bone · sagittal · 0.29mm/px · 3 of 102 slices shown]
[im 26/102  bone]
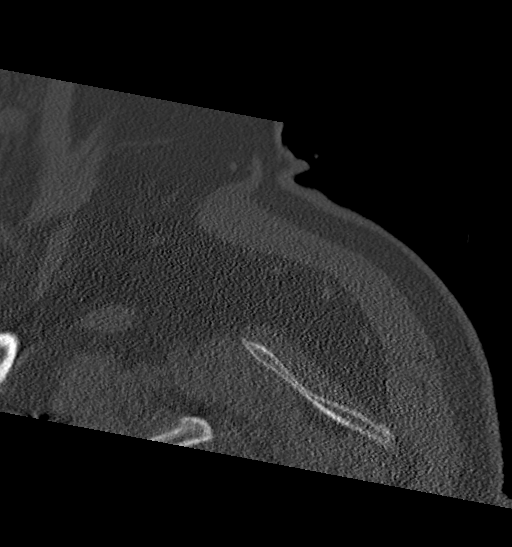
[im 51/102  bone]
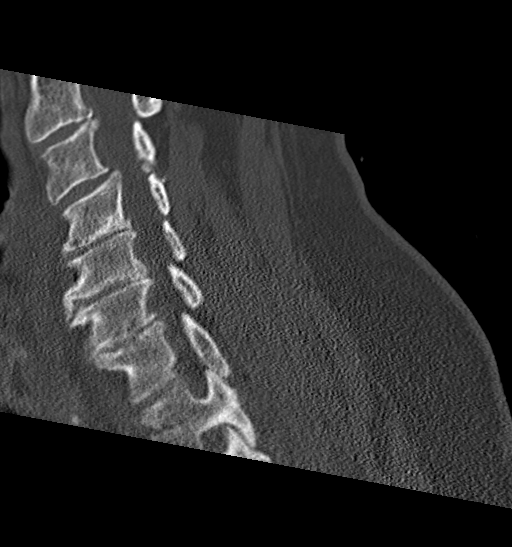
[im 76/102  bone]
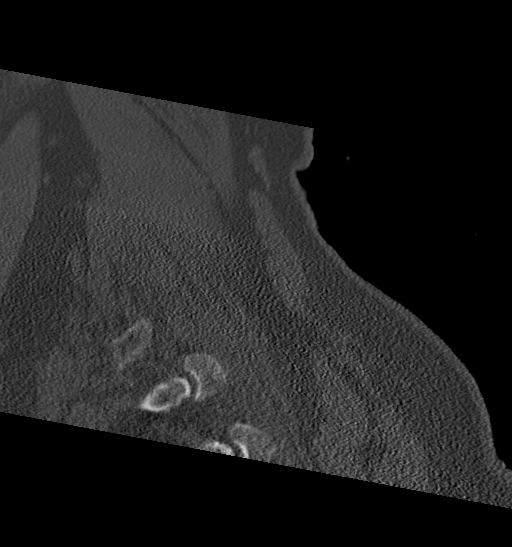

[7 of 33 positions shown; findings below may reference images not displayed]

FINDINGS: CT HEAD FINDINGS

Brain: Chronic stable involutional changes of the brain consistent
with atrophy with chronic small vessel ischemia. No large vascular
territory infarction, hemorrhage or midline shift. No hydrocephalus.
No intra-axial mass nor extra-axial fluid collections.

Vascular: No hyperdense vessels. Moderate atherosclerosis of the
carotid siphons.

Skull: No skull fracture.

Sinuses/Orbits: No acute abnormality.  Intact orbits and globes.

Other: No significant soft tissue swelling.

CT CERVICAL SPINE FINDINGS

Alignment: Intact craniocervical relationship and atlantodental
interval. Minimal grade 1 anterolisthesis C3 on C4 likely on the
basis of degenerative disc and facet arthropathy.

Skull base and vertebrae: No acute fracture. No primary bone lesion
or focal pathologic process.

Soft tissues and spinal canal: No prevertebral fluid or swelling. No
visible canal hematoma.

Disc levels: Prominent anterior osteophytes C4 through C6 with small
posterior marginal osteophytes. No significant central canal
stenosis. Minimal bilateral neural foraminal encroachment from
uncinate spurring bilaterally at C2-3, C4-5, C5-6 and C6-7.

Upper chest: Not included

Other: Redemonstration of solid masslike abnormalities in the left
parotid as described on recent neck CT from 11/15/2017. The largest
is approximately 11 mm and may reflect a Warthin's gland tumor is
initially stated or possibly pleomorphic adenoma or parotid lymph
nodes.
IMPRESSION: 1. Atrophy with chronic small vessel ischemia. No acute intracranial
abnormality or skull fracture.
2. Cervical spondylosis without acute cervical spine fracture.
3. Redemonstration of solid nodules in the left parotid gland as
seen on recent CT neck, the largest approximately 11 mm on current
study.

## 2019-10-22 DIAGNOSIS — Z20828 Contact with and (suspected) exposure to other viral communicable diseases: Secondary | ICD-10-CM | POA: Diagnosis not present

## 2019-10-28 DIAGNOSIS — Z20828 Contact with and (suspected) exposure to other viral communicable diseases: Secondary | ICD-10-CM | POA: Diagnosis not present

## 2019-11-04 DIAGNOSIS — Z20828 Contact with and (suspected) exposure to other viral communicable diseases: Secondary | ICD-10-CM | POA: Diagnosis not present

## 2020-01-23 ENCOUNTER — Telehealth: Payer: Self-pay

## 2020-01-23 NOTE — Telephone Encounter (Signed)
The pt was having difficulties send a manual transmission. I conference the pt with [employer] to get help trouble shoot the monitor. [employer] is sending him a new monitor.

## 2020-01-30 ENCOUNTER — Ambulatory Visit (INDEPENDENT_AMBULATORY_CARE_PROVIDER_SITE_OTHER): Payer: Medicare HMO | Admitting: *Deleted

## 2020-01-30 DIAGNOSIS — I442 Atrioventricular block, complete: Secondary | ICD-10-CM | POA: Diagnosis not present

## 2020-01-30 LAB — CUP PACEART REMOTE DEVICE CHECK
Battery Impedance: 254 Ohm
Battery Remaining Longevity: 116 mo
Battery Voltage: 2.79 V
Brady Statistic AP VP Percent: 45 %
Brady Statistic AP VS Percent: 0 %
Brady Statistic AS VP Percent: 55 %
Brady Statistic AS VS Percent: 0 %
Date Time Interrogation Session: 20210326104724
Implantable Lead Implant Date: 20160902
Implantable Lead Implant Date: 20160902
Implantable Lead Location: 753859
Implantable Lead Location: 753860
Implantable Lead Model: 5076
Implantable Lead Model: 5076
Implantable Pulse Generator Implant Date: 20160902
Lead Channel Impedance Value: 455 Ohm
Lead Channel Impedance Value: 520 Ohm
Lead Channel Pacing Threshold Amplitude: 0.625 V
Lead Channel Pacing Threshold Amplitude: 0.875 V
Lead Channel Pacing Threshold Pulse Width: 0.4 ms
Lead Channel Pacing Threshold Pulse Width: 0.4 ms
Lead Channel Setting Pacing Amplitude: 1.5 V
Lead Channel Setting Pacing Amplitude: 2 V
Lead Channel Setting Pacing Pulse Width: 0.4 ms
Lead Channel Setting Sensing Sensitivity: 4 mV

## 2020-01-30 NOTE — Progress Notes (Signed)
PPM Remote  

## 2020-03-19 ENCOUNTER — Telehealth: Payer: Self-pay

## 2020-03-19 ENCOUNTER — Ambulatory Visit
Admission: RE | Admit: 2020-03-19 | Discharge: 2020-03-19 | Disposition: A | Discharge status: Home / Self Care | Payer: Medicare HMO | Source: Ambulatory Visit | Attending: Radiation Oncology | Admitting: Radiation Oncology

## 2020-03-19 NOTE — Telephone Encounter (Signed)
Called patient to see if he was aware of his appointment this afternoon with Dr. Isidore Moos, and if so if he was on his way. Patient stated he was not aware of appointment, and that someone named Aniceto Boss handled scheduling his transportation for his appointments. Told patient I would call Aniceto Boss and try and get his appointment moved to next week. Patient verbalized understanding and agreement.   Called number listed in chart for Aniceto Boss [phone]) but was told by another representative that Aniceto Boss was out of the office for day. Left my office number for Tasha to call back next week to reschedule patient. No other needs identified at this time

## 2020-03-25 ENCOUNTER — Telehealth: Payer: Self-pay | Admitting: *Deleted

## 2020-03-25 NOTE — Telephone Encounter (Signed)
CALLED PATIENT'S TRANSPO TO  ARRANGE FOR TRANSPO FOR THIS PATIENT FOR FU APPT. WITH DR. Isidore Moos ON 04/07/20 @ 11 AM, LVM FOR A RETURN CALL

## 2020-04-06 NOTE — Progress Notes (Signed)
Patient presents today for follow-up after completing radiation to the left parotid on 11/04/2018  Pain issues, if any: Patient denies (though he does admit that he thinks a care taker at [facility] gave him a pain pill this AM because of some lower back pain) Using a feeding tube?: N/A Weight changes, if any:  Wt Readings from Last 3 Encounters:  04/07/20 190 lb 3.2 oz (86.3 kg)  09/19/19 187 lb 3.2 oz (84.9 kg)  04/04/19 175 lb 0.7 oz (79.4 kg)   Swallowing issues, if any: Patient denies, but does state he feels phlegm builds up in his throat and he has to cough to clear it out Smoking or chewing tobacco? 2-3 cigarettes daily Using fluoride trays daily? N/A (patient has dentures) Last ENT visit was on: Has not seen anyone since 05/26/2019 when he saw Dr. Lind Guest Other notable issues, if any: Patient denies any new concerns or issues  Vitals:   04/07/20 1105  BP: (!) 118/58  Pulse: 68  Resp: 20  Temp: 97.7 F (36.5 C)  SpO2: 96%

## 2020-04-07 ENCOUNTER — Ambulatory Visit
Admission: RE | Admit: 2020-04-07 | Discharge: 2020-04-07 | Disposition: A | Discharge status: Home / Self Care | Payer: Medicare HMO | Source: Ambulatory Visit | Attending: Radiation Oncology | Admitting: Radiation Oncology

## 2020-04-07 ENCOUNTER — Encounter: Payer: Self-pay | Admitting: Radiation Oncology

## 2020-04-07 ENCOUNTER — Other Ambulatory Visit: Payer: Self-pay

## 2020-04-07 VITALS — BP 118/58 | HR 68 | Temp 97.7°F | Resp 20 | Ht 69.0 in | Wt 190.2 lb

## 2020-04-07 DIAGNOSIS — Z79899 Other long term (current) drug therapy: Secondary | ICD-10-CM | POA: Insufficient documentation

## 2020-04-07 DIAGNOSIS — F17210 Nicotine dependence, cigarettes, uncomplicated: Secondary | ICD-10-CM | POA: Insufficient documentation

## 2020-04-07 DIAGNOSIS — Z923 Personal history of irradiation: Secondary | ICD-10-CM | POA: Insufficient documentation

## 2020-04-07 DIAGNOSIS — Z8589 Personal history of malignant neoplasm of other organs and systems: Secondary | ICD-10-CM | POA: Diagnosis not present

## 2020-04-07 DIAGNOSIS — C07 Malignant neoplasm of parotid gland: Secondary | ICD-10-CM

## 2020-04-07 NOTE — Progress Notes (Signed)
Radiation Oncology         [phone] ________________________________  Name: Francisco [name] MRN: [id number]  Date: 04/07/2020  DOB: [birth year]  Follow-Up Visit Note  CC: Lesia Hausen, PA  Helayne Seminole, MD  Diagnosis and Prior Radiotherapy:       ICD-10-CM   1. 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    CHIEF COMPLAINT:  Here for follow-up and surveillance of left parotid cancer  Radiation treatment dates:   10/07/18 - 11/04/18  Site/dose:   Parotid, Left / 50 Gy in 20 fractions of 2.5 Gy  Narrative:    Patient presents today for follow-up after completing radiation to the left parotid on 11/04/2018  Pain issues, if any: Patient denies (though he does admit that he thinks a care taker at [facility] gave him a pain pill this AM because of some lower back pain) Using a feeding tube?: N/A Weight changes, if any:  Wt Readings from Last 3 Encounters:  04/07/20 190 lb 3.2 oz (86.3 kg)  09/19/19 187 lb 3.2 oz (84.9 kg)  04/04/19 175 lb 0.7 oz (79.4 kg)   Swallowing issues, if any: Patient denies, but does state he feels phlegm builds up in his throat and he has to cough to clear it out He denies any palpable masses in his neck. Smoking or chewing tobacco? 2-3 cigarettes daily and chewing Using fluoride trays daily? N/A (patient has dentures) Last ENT visit was on: Has not seen anyone since 05/26/2019 when he saw Dr. Lind Guest Other notable issues, if any: Patient denies any new concerns or issues  He reports that he was vaccinated for Covid with Moderna shots x2  ALLERGIES:  has No Known Allergies.  Meds: Current Outpatient Medications  Medication Sig Dispense Refill  . allopurinol (ZYLOPRIM) 300 MG tablet TAKE 1 TABLET(300 MG) BY MOUTH DAILY (Patient taking differently: Take 300 mg by mouth daily. ) 90 tablet 0  . alum & mag hydroxide-simeth (MYLANTA) 200-200-20 MG/5ML suspension Take 30 mLs by mouth as  needed for indigestion or heartburn.    Marland Kitchen atorvastatin (LIPITOR) 20 MG tablet Take 1 tablet (20 mg total) by mouth daily. 90 tablet 3  . colchicine 0.6 MG tablet Take 1 tablet (0.6 mg total) by mouth 2 (two) times daily as needed (gout flare). Take twice daily for 2 weeks, then use as needed for gout flare up 60 tablet 0  . furosemide (LASIX) 20 MG tablet TAKE 1 TABLET BY MOUTH DAILY (Patient taking differently: Take 20 mg by mouth daily. ) 90 tablet 0  . guaiFENesin (MUCINEX) 600 MG 12 hr tablet Take 600-1,200 mg by mouth every 12 (twelve) hours as needed for cough.     Marland Kitchen guaiFENesin (ROBITUSSIN) 100 MG/5ML liquid Take 200 mg by mouth every 6 (six) hours as needed for cough.     . levETIRAcetam (KEPPRA) 750 MG tablet Take 1,500 mg by mouth daily.    Marland Kitchen lisinopril (PRINIVIL,ZESTRIL) 20 MG tablet TAKE 1 TABLET(20 MG) BY MOUTH DAILY (Patient taking differently: Take 10 mg by mouth daily. ) 90 tablet 0  . loperamide (IMODIUM A-D) 2 MG tablet Take 2 mg by mouth as needed for diarrhea or loose stools.     . magnesium hydroxide (MILK OF MAGNESIA) 400 MG/5ML suspension Take 30 mLs by mouth at bedtime as needed for mild constipation.    . metFORMIN (GLUCOPHAGE) 500 MG tablet Take 500 mg by mouth daily.    Marland Kitchen  Multiple Vitamin (MULTIVITAMIN WITH MINERALS) TABS tablet Take 1 tablet by mouth daily.    . naproxen sodium (ALEVE) 220 MG tablet Take 220 mg by mouth every 12 (twelve) hours as needed (joint pain).    Marland Kitchen neomycin-bacitracin-polymyxin (NEOSPORIN) ointment Apply 1 application topically as needed for wound care.    . nystatin (MYCOSTATIN) 100000 UNIT/ML suspension Take 5 mLs (500,000 Units total) by mouth 4 (four) times daily. Swish and gargle in mouth for a minute, then swallow. This should help thrush in mouth. Use QID until bottle is empty. 473 mL 0  . pantoprazole (PROTONIX) 40 MG tablet TAKE 1 TABLET BY MOUTH TWICE DAILY BEFORE A MEAL 180 tablet 0  . polyethylene glycol (MIRALAX / GLYCOLAX) packet Take  17 g by mouth 2 (two) times daily. 14 each 0  . tamsulosin (FLOMAX) 0.4 MG CAPS capsule TAKE 1 CAPSULE(0.4 MG) BY MOUTH DAILY (Patient taking differently: Take 0.4 mg by mouth daily. ) 90 capsule 0  . traMADol (ULTRAM) 50 MG tablet Take 50 mg by mouth daily as needed.    . vitamin B-12 (CYANOCOBALAMIN) 500 MCG tablet Take 500 mcg by mouth daily.    Marland Kitchen amLODipine (NORVASC) 5 MG tablet TAKE 1 TABLET(5 MG) BY MOUTH DAILY (Patient not taking: No sig reported) 90 tablet 0  . clotrimazole-betamethasone (LOTRISONE) cream APPLY EXTERNALLY TO THE AFFECTED AREA TWICE DAILY (Patient not taking: No sig reported) 45 g 0  . ferrous sulfate 325 (65 FE) MG tablet Take 1 tablet (325 mg total) by mouth daily. (Patient not taking: Reported on 04/04/2019)    . folic acid (FOLVITE) 1 MG tablet Take 1 tablet (1 mg total) by mouth daily. (Patient not taking: Reported on 04/04/2019)    . levofloxacin (LEVAQUIN) 500 MG tablet Take 1 tablet (500 mg total) by mouth daily. (Patient not taking: Reported on 09/19/2019) 10 tablet 0  . lidocaine (XYLOCAINE) 2 % solution Patient: Mix 1part 2% viscous lidocaine, 1part H20. Swish & swallow 27m of diluted mixture, 232m before meals and at bedtime, up to QID (Patient not taking: Reported on 04/04/2019) 200 mL 3  . metoprolol tartrate (LOPRESSOR) 50 MG tablet Take 1.5 tablets (75 mg total) by mouth 2 (two) times daily. 90 tablet 6  . neomycin-colistin-hydrocortisone-thonzonium (CORTISPORIN-TC) 3.01-06-09-0.5 MG/ML OTIC suspension Place 3 drops into the left ear 4 (four) times daily. (Patient not taking: Reported on 04/04/2019) 10 mL 0  . potassium chloride (K-DUR) 10 MEQ tablet TAKE 1 TABLET(10 MEQ) BY MOUTH DAILY (Patient not taking: No sig reported) 90 tablet 0  . thiamine 100 MG tablet Take 1 tablet (100 mg total) by mouth daily. (Patient not taking: Reported on 04/04/2019)     No current facility-administered medications for this encounter.    Physical Findings: The patient is in no  acute distress. Patient is alert and oriented. Wt Readings from Last 3 Encounters:  04/07/20 190 lb 3.2 oz (86.3 kg)  09/19/19 187 lb 3.2 oz (84.9 kg)  04/04/19 175 lb 0.7 oz (79.4 kg)    height is 5' 9"  (1.753 m) and weight is 190 lb 3.2 oz (86.3 kg). His temperature is 97.7 F (36.5 C). His blood pressure is 118/58 (abnormal) and his pulse is 68. His respiration is 20 and oxygen saturation is 96%. .   General: Alert and oriented, in no acute distress HEENT: Head is normocephalic   Oropharynx and oral cavity without thrush or tumor, though there is tobacco staining on his tongue Neck: Neck is notable for without  any any palpable adenopathy in the neck or periauricular regions Skin: Skin in treatment fields has healed well     Lab Findings: Lab Results  Component Value Date   WBC 7.9 04/04/2019   HGB 12.4 (L) 04/04/2019   HCT 36.1 (L) 04/04/2019   MCV 88.0 04/04/2019   PLT 220 04/04/2019    Lab Results  Component Value Date   TSH 1.372 11/30/2017    Radiographic Findings: No results found.  Impression/Plan:   1) Head and Neck Cancer Status: No evidence of recurrence   2) Nutritional Status: No active issues.   3) Thyroid function: unlikely to be affected by ipsilateral radiotherapy.  Lab Results  Component Value Date   TSH 1.372 11/30/2017   4) Other: Anderson Malta, our head and neck navigator will arrange follow-up in 4-6 months with ENT  (the patient will see me in approximately 10 months for follow-up).  5) The patient continues to use tobacco products.  He does not appear motivated to quit at this time.  6) We discussed measures to reduce the risk of infection during the COVID-19 pandemic.  Fortunately, he has been vaccinated.    On date of service in total I spent 25 minutes on this encounter.  He was seen in person. _______________________________________    Eppie Gibson, MD

## 2020-04-30 ENCOUNTER — Telehealth: Payer: Self-pay

## 2020-04-30 NOTE — Telephone Encounter (Signed)
Unable to speak  with patient to remind of missed remote transmission 

## 2020-05-18 ENCOUNTER — Ambulatory Visit (INDEPENDENT_AMBULATORY_CARE_PROVIDER_SITE_OTHER): Payer: Medicare HMO | Admitting: *Deleted

## 2020-05-18 DIAGNOSIS — I442 Atrioventricular block, complete: Secondary | ICD-10-CM | POA: Diagnosis not present

## 2020-05-19 LAB — CUP PACEART REMOTE DEVICE CHECK
Battery Impedance: 302 Ohm
Battery Remaining Longevity: 110 mo
Battery Voltage: 2.79 V
Brady Statistic AP VP Percent: 47 %
Brady Statistic AP VS Percent: 0 %
Brady Statistic AS VP Percent: 53 %
Brady Statistic AS VS Percent: 0 %
Date Time Interrogation Session: 20210713145235
Implantable Lead Implant Date: 20160902
Implantable Lead Implant Date: 20160902
Implantable Lead Location: 753859
Implantable Lead Location: 753860
Implantable Lead Model: 5076
Implantable Lead Model: 5076
Implantable Pulse Generator Implant Date: 20160902
Lead Channel Impedance Value: 462 Ohm
Lead Channel Impedance Value: 521 Ohm
Lead Channel Pacing Threshold Amplitude: 0.625 V
Lead Channel Pacing Threshold Amplitude: 0.875 V
Lead Channel Pacing Threshold Pulse Width: 0.4 ms
Lead Channel Pacing Threshold Pulse Width: 0.4 ms
Lead Channel Setting Pacing Amplitude: 1.5 V
Lead Channel Setting Pacing Amplitude: 2 V
Lead Channel Setting Pacing Pulse Width: 0.4 ms
Lead Channel Setting Sensing Sensitivity: 4 mV

## 2020-05-19 NOTE — Progress Notes (Signed)
Remote pacemaker transmission.   

## 2020-08-02 ENCOUNTER — Encounter (HOSPITAL_COMMUNITY): Payer: Self-pay | Admitting: Emergency Medicine

## 2020-08-02 ENCOUNTER — Other Ambulatory Visit: Payer: Self-pay

## 2020-08-02 ENCOUNTER — Emergency Department (HOSPITAL_COMMUNITY): Payer: Medicare HMO

## 2020-08-02 ENCOUNTER — Inpatient Hospital Stay (HOSPITAL_COMMUNITY)
Admission: EM | Admit: 2020-08-02 | Discharge: 2020-08-16 | DRG: 826 | Disposition: A | Discharge status: Skilled Nursing Facility | Payer: Medicare HMO | Source: Skilled Nursing Facility | Attending: Internal Medicine | Admitting: Internal Medicine

## 2020-08-02 DIAGNOSIS — Z807 Family history of other malignant neoplasms of lymphoid, hematopoietic and related tissues: Secondary | ICD-10-CM

## 2020-08-02 DIAGNOSIS — I442 Atrioventricular block, complete: Secondary | ICD-10-CM | POA: Diagnosis present

## 2020-08-02 DIAGNOSIS — E782 Mixed hyperlipidemia: Secondary | ICD-10-CM | POA: Diagnosis present

## 2020-08-02 DIAGNOSIS — Z66 Do not resuscitate: Secondary | ICD-10-CM | POA: Diagnosis present

## 2020-08-02 DIAGNOSIS — Z794 Long term (current) use of insulin: Secondary | ICD-10-CM

## 2020-08-02 DIAGNOSIS — I5032 Chronic diastolic (congestive) heart failure: Secondary | ICD-10-CM

## 2020-08-02 DIAGNOSIS — G936 Cerebral edema: Secondary | ICD-10-CM | POA: Diagnosis present

## 2020-08-02 DIAGNOSIS — I951 Orthostatic hypotension: Secondary | ICD-10-CM | POA: Diagnosis present

## 2020-08-02 DIAGNOSIS — R296 Repeated falls: Secondary | ICD-10-CM | POA: Diagnosis present

## 2020-08-02 DIAGNOSIS — C4A4 Merkel cell carcinoma of scalp and neck: Secondary | ICD-10-CM

## 2020-08-02 DIAGNOSIS — D496 Neoplasm of unspecified behavior of brain: Secondary | ICD-10-CM | POA: Diagnosis present

## 2020-08-02 DIAGNOSIS — E119 Type 2 diabetes mellitus without complications: Secondary | ICD-10-CM

## 2020-08-02 DIAGNOSIS — E1165 Type 2 diabetes mellitus with hyperglycemia: Secondary | ICD-10-CM | POA: Diagnosis not present

## 2020-08-02 DIAGNOSIS — Z20822 Contact with and (suspected) exposure to covid-19: Secondary | ICD-10-CM | POA: Diagnosis present

## 2020-08-02 DIAGNOSIS — Z8589 Personal history of malignant neoplasm of other organs and systems: Secondary | ICD-10-CM

## 2020-08-02 DIAGNOSIS — G935 Compression of brain: Secondary | ICD-10-CM | POA: Diagnosis present

## 2020-08-02 DIAGNOSIS — I110 Hypertensive heart disease with heart failure: Secondary | ICD-10-CM | POA: Diagnosis present

## 2020-08-02 DIAGNOSIS — I6523 Occlusion and stenosis of bilateral carotid arteries: Secondary | ICD-10-CM | POA: Diagnosis present

## 2020-08-02 DIAGNOSIS — I421 Obstructive hypertrophic cardiomyopathy: Secondary | ICD-10-CM | POA: Diagnosis present

## 2020-08-02 DIAGNOSIS — J449 Chronic obstructive pulmonary disease, unspecified: Secondary | ICD-10-CM | POA: Diagnosis present

## 2020-08-02 DIAGNOSIS — F1021 Alcohol dependence, in remission: Secondary | ICD-10-CM | POA: Diagnosis present

## 2020-08-02 DIAGNOSIS — Z950 Presence of cardiac pacemaker: Secondary | ICD-10-CM

## 2020-08-02 DIAGNOSIS — G9389 Other specified disorders of brain: Secondary | ICD-10-CM | POA: Diagnosis present

## 2020-08-02 DIAGNOSIS — Z8711 Personal history of peptic ulcer disease: Secondary | ICD-10-CM

## 2020-08-02 DIAGNOSIS — K900 Celiac disease: Secondary | ICD-10-CM | POA: Diagnosis present

## 2020-08-02 DIAGNOSIS — Z923 Personal history of irradiation: Secondary | ICD-10-CM

## 2020-08-02 DIAGNOSIS — F1011 Alcohol abuse, in remission: Secondary | ICD-10-CM | POA: Diagnosis present

## 2020-08-02 DIAGNOSIS — F17210 Nicotine dependence, cigarettes, uncomplicated: Secondary | ICD-10-CM | POA: Diagnosis present

## 2020-08-02 DIAGNOSIS — C7B1 Secondary Merkel cell carcinoma: Secondary | ICD-10-CM | POA: Diagnosis not present

## 2020-08-02 DIAGNOSIS — M109 Gout, unspecified: Secondary | ICD-10-CM | POA: Diagnosis present

## 2020-08-02 DIAGNOSIS — R471 Dysarthria and anarthria: Secondary | ICD-10-CM | POA: Diagnosis not present

## 2020-08-02 DIAGNOSIS — I10 Essential (primary) hypertension: Secondary | ICD-10-CM | POA: Diagnosis present

## 2020-08-02 DIAGNOSIS — G40909 Epilepsy, unspecified, not intractable, without status epilepticus: Secondary | ICD-10-CM

## 2020-08-02 DIAGNOSIS — Z7984 Long term (current) use of oral hypoglycemic drugs: Secondary | ICD-10-CM

## 2020-08-02 DIAGNOSIS — R569 Unspecified convulsions: Principal | ICD-10-CM

## 2020-08-02 DIAGNOSIS — N400 Enlarged prostate without lower urinary tract symptoms: Secondary | ICD-10-CM | POA: Diagnosis present

## 2020-08-02 DIAGNOSIS — Z79899 Other long term (current) drug therapy: Secondary | ICD-10-CM

## 2020-08-02 DIAGNOSIS — K219 Gastro-esophageal reflux disease without esophagitis: Secondary | ICD-10-CM | POA: Diagnosis present

## 2020-08-02 DIAGNOSIS — H53462 Homonymous bilateral field defects, left side: Secondary | ICD-10-CM | POA: Diagnosis present

## 2020-08-02 DIAGNOSIS — E1169 Type 2 diabetes mellitus with other specified complication: Secondary | ICD-10-CM | POA: Diagnosis present

## 2020-08-02 DIAGNOSIS — G9341 Metabolic encephalopathy: Secondary | ICD-10-CM

## 2020-08-02 LAB — CBC WITH DIFFERENTIAL/PLATELET
Abs Immature Granulocytes: 0.02 10*3/uL (ref 0.00–0.07)
Basophils Absolute: 0 10*3/uL (ref 0.0–0.1)
Basophils Relative: 0 %
Eosinophils Absolute: 0.3 10*3/uL (ref 0.0–0.5)
Eosinophils Relative: 3 %
HCT: 30.5 % — ABNORMAL LOW (ref 39.0–52.0)
Hemoglobin: 10.4 g/dL — ABNORMAL LOW (ref 13.0–17.0)
Immature Granulocytes: 0 %
Lymphocytes Relative: 17 %
Lymphs Abs: 1.4 10*3/uL (ref 0.7–4.0)
MCH: 30.3 pg (ref 26.0–34.0)
MCHC: 34.1 g/dL (ref 30.0–36.0)
MCV: 88.9 fL (ref 80.0–100.0)
Monocytes Absolute: 0.5 10*3/uL (ref 0.1–1.0)
Monocytes Relative: 6 %
Neutro Abs: 6.1 10*3/uL (ref 1.7–7.7)
Neutrophils Relative %: 74 %
Platelets: 240 10*3/uL (ref 150–400)
RBC: 3.43 MIL/uL — ABNORMAL LOW (ref 4.22–5.81)
RDW: 14.8 % (ref 11.5–15.5)
WBC: 8.3 10*3/uL (ref 4.0–10.5)
nRBC: 0 % (ref 0.0–0.2)

## 2020-08-02 LAB — COMPREHENSIVE METABOLIC PANEL
ALT: 14 U/L (ref 0–44)
AST: 21 U/L (ref 15–41)
Albumin: 4 g/dL (ref 3.5–5.0)
Alkaline Phosphatase: 70 U/L (ref 38–126)
Anion gap: 8 (ref 5–15)
BUN: 26 mg/dL — ABNORMAL HIGH (ref 8–23)
CO2: 26 mmol/L (ref 22–32)
Calcium: 9.2 mg/dL (ref 8.9–10.3)
Chloride: 97 mmol/L — ABNORMAL LOW (ref 98–111)
Creatinine, Ser: 1.61 mg/dL — ABNORMAL HIGH (ref 0.61–1.24)
GFR calc Af Amer: 48 mL/min — ABNORMAL LOW (ref >60)
GFR calc non Af Amer: 42 mL/min — ABNORMAL LOW (ref >60)
Glucose, Bld: 105 mg/dL — ABNORMAL HIGH (ref 70–99)
Potassium: 3.9 mmol/L (ref 3.5–5.1)
Sodium: 131 mmol/L — ABNORMAL LOW (ref 135–145)
Total Bilirubin: 0.2 mg/dL — ABNORMAL LOW (ref 0.3–1.2)
Total Protein: 7.2 g/dL (ref 6.5–8.1)

## 2020-08-02 LAB — URINALYSIS, ROUTINE W REFLEX MICROSCOPIC
Bilirubin Urine: NEGATIVE
Glucose, UA: NEGATIVE mg/dL
Hgb urine dipstick: NEGATIVE
Ketones, ur: NEGATIVE mg/dL
Leukocytes,Ua: NEGATIVE
Nitrite: NEGATIVE
Protein, ur: NEGATIVE mg/dL
Specific Gravity, Urine: 1.009 (ref 1.005–1.030)
pH: 5 (ref 5.0–8.0)

## 2020-08-02 LAB — RESPIRATORY PANEL BY RT PCR (FLU A&B, COVID)
Influenza A by PCR: NEGATIVE
Influenza B by PCR: NEGATIVE
SARS Coronavirus 2 by RT PCR: NEGATIVE

## 2020-08-02 MED ORDER — LACTATED RINGERS IV SOLN: INTRAVENOUS | Status: DC

## 2020-08-02 MED ORDER — DEXAMETHASONE SODIUM PHOSPHATE 10 MG/ML IJ SOLN
10.0000 mg | Freq: Once | INTRAMUSCULAR | Status: AC
  Administered 2020-08-02: 10 mg via INTRAVENOUS
  Filled 2020-08-02: qty 1

## 2020-08-02 MED ORDER — LORAZEPAM 2 MG/ML IJ SOLN: 2.0000 mg | Freq: Once | INTRAMUSCULAR | Status: DC | PRN

## 2020-08-02 MED ORDER — LEVETIRACETAM IN NACL 1000 MG/100ML IV SOLN
1000.0000 mg | Freq: Once | INTRAVENOUS | Status: AC
  Administered 2020-08-02: 1000 mg via INTRAVENOUS
  Filled 2020-08-02: qty 100

## 2020-08-02 NOTE — ED Notes (Signed)
c-collar in place, padded side rails in place.

## 2020-08-02 NOTE — ED Triage Notes (Signed)
Arrives via EMS from [facility] assisted living. C/C 3 witnessed seizures, patient was found on the bed on his abdomen. Seizures were 1 min each, 3-4 min where pt did not regain consciousness. Small abrasion to L forehead, and an older laceration to L forearm from a fall this morning. Complains of R thigh and back pain. EMS reports A&O x3, which is around his baseline. CBG 184.   18 G LAC

## 2020-08-02 NOTE — ED Notes (Signed)
Admitting provider at the bedside at this time to evaluate the patient.

## 2020-08-02 NOTE — ED Notes (Signed)
Patient transported to CT 

## 2020-08-02 NOTE — ED Provider Notes (Signed)
[facility] EMERGENCY DEPARTMENT Provider Note  CSN: [id number] Arrival date & time: 08/02/20 1859    History Chief Complaint  Patient presents with  . Seizures    HPI  Francisco [name] is a 74 y.o. male with history of seizure disorder living at local ALF was brought by EMS for evaluation after reportedly having 3 witnessed seizures earlier today. Per EMS, seizures were brief, self limited and followed by a post-ictal state. He was noted to have an abrasion to his forehead and so a C-collar was placed. Patient denies any complaints to me, states he has been compliant with his seizure meds.    Past Medical History:  Diagnosis Date  . Anemia   . Anginal pain ([place])    normal coronaries 2016  . Celiac disease   . CHF (congestive heart failure) ([place])   . COPD (chronic obstructive pulmonary disease) ([place])   . GERD (gastroesophageal reflux disease)   . Heart murmur   . History of radiation therapy 10/07/18- 11/04/18   Parotid, left 50 Gy in 20 fractions of 2.5 Gy.   Marland Kitchen Hypertension   . Multiple duodenal ulcers   . parotid gland cancer Left   . Presence of permanent cardiac pacemaker   . Right internal carotid occlusion   . Seizures ([place])   . Shortness of breath   . Symptomatic Bradycardia    a. 07/2015 s/p MDT Advisa L DC PPM (Ser #: [id number]).  . 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 CARDIAC CATHETERIZATION N/A 07/08/2015   Procedure: Left Heart Cath and Coronary Angiography;  Surgeon: Troy Sine, MD;  Location: [facility];  Service: Cardiovascular;  Laterality: N/A;  . CARDIAC CATHETERIZATION N/A 07/08/2015   Procedure: Temporary Pacemaker;  Surgeon: Troy Sine, MD;  Location: [facility];  Service: Cardiovascular;  Laterality: N/A;  . CARDIAC CATHETERIZATION N/A 07/09/2015   Procedure: Temporary Pacemaker;  Surgeon: Troy Sine, MD;  Location: [facility];   Service: Cardiovascular;  Laterality: N/A;  . EP IMPLANTABLE DEVICE N/A 07/09/2015   Procedure: Pacemaker Implant;  Surgeon: Will Meredith Leeds, MD;  Location: [facility];  Service: Cardiovascular;  Laterality: N/A;  . ESOPHAGOGASTRODUODENOSCOPY  07/03/2012   Procedure: ESOPHAGOGASTRODUODENOSCOPY (EGD);  Surgeon: Winfield Cunas., MD;  Location: [facility] ENDOSCOPY;  Service: Endoscopy;  Laterality: N/A;  . HEMORRHOID SURGERY    . HERNIA REPAIR    . MOLE REMOVAL    . PAROTIDECTOMY Left 08/21/2018   superficial  . PAROTIDECTOMY Left 08/21/2018   Procedure: LEFT SUPERFICIAL PAROTIDECTOMY;  Surgeon: Helayne Seminole, MD;  Location: [facility] OR;  Service: ENT;  Laterality: Left;    Family History  Problem Relation Age of Onset  . Hodgkin's lymphoma Father     Social History   Tobacco Use  . Smoking status: Current Every Day Smoker    Packs/day: 0.25    Years: 50.00    Pack years: 12.50    Types: Cigarettes  . Smokeless tobacco: Current User  . Tobacco comment: 3-4 cigarettes daily- 09/19/19  Vaping Use  . Vaping Use: Former  Substance Use Topics  . Alcohol use: Not Currently  . Drug use: No     Home Medications Prior to Admission medications   Medication Sig Start Date End Date Taking? Authorizing Provider  allopurinol (ZYLOPRIM) 300 MG tablet TAKE 1 TABLET(300 MG) BY MOUTH DAILY Patient taking differently: Take  300 mg by mouth daily.  05/30/18   Orlena , PA-C  alum & mag hydroxide-simeth [facility]) 200-200-20 MG/5ML suspension Take 30 mLs by mouth as needed for indigestion or heartburn.    [provider]  amLODipine (NORVASC) 5 MG tablet TAKE 1 TABLET(5 MG) BY MOUTH DAILY Patient not taking: No sig reported 05/30/18   Dena Billet B, PA-C  atorvastatin (LIPITOR) 20 MG tablet Take 1 tablet (20 mg total) by mouth daily. 11/23/17   [place], Modena Nunnery, MD  clotrimazole-betamethasone (LOTRISONE) cream APPLY EXTERNALLY TO THE AFFECTED AREA TWICE DAILY Patient not  taking: No sig reported 03/22/17   Alycia Rossetti, MD  colchicine 0.6 MG tablet Take 1 tablet (0.6 mg total) by mouth 2 (two) times daily as needed (gout flare). Take twice daily for 2 weeks, then use as needed for gout flare up 12/04/17   Eugenie Filler, MD  ferrous sulfate 325 (65 FE) MG tablet Take 1 tablet (325 mg total) by mouth daily. Patient not taking: Reported on 04/04/2019 12/04/17   Eugenie Filler, MD  folic acid (FOLVITE) 1 MG tablet Take 1 tablet (1 mg total) by mouth daily. Patient not taking: Reported on 04/04/2019 12/05/17   Eugenie Filler, MD  furosemide (LASIX) 20 MG tablet TAKE 1 TABLET BY MOUTH DAILY Patient taking differently: Take 20 mg by mouth daily.  01/23/18   [place], Modena Nunnery, MD  guaiFENesin (MUCINEX) 600 MG 12 hr tablet Take 600-1,200 mg by mouth every 12 (twelve) hours as needed for cough.     [provider]  guaiFENesin (ROBITUSSIN) 100 MG/5ML liquid Take 200 mg by mouth every 6 (six) hours as needed for cough.     [provider]  levETIRAcetam (KEPPRA) 750 MG tablet Take 1,500 mg by mouth daily. 02/17/19   [provider]  levofloxacin (LEVAQUIN) 500 MG tablet Take 1 tablet (500 mg total) by mouth daily. Patient not taking: Reported on 09/19/2019 04/04/19   Varney Biles, MD  lidocaine (XYLOCAINE) 2 % solution Patient: Mix 1part 2% viscous lidocaine, 1part H20. Swish & swallow 69m of diluted mixture, 273m before meals and at bedtime, up to QID Patient not taking: Reported on 04/04/2019 10/21/18   SqEppie GibsonMD  lisinopril (PRINIVIL,ZESTRIL) 20 MG tablet TAKE 1 TABLET(20 MG) BY MOUTH DAILY Patient taking differently: Take 10 mg by mouth daily.  06/03/18   DiOrlena PA-C  loperamide (IMODIUM A-D) 2 MG tablet Take 2 mg by mouth as needed for diarrhea or loose stools.     [provider]  magnesium hydroxide (MILK OF MAGNESIA) 400 MG/5ML suspension Take 30 mLs by mouth at bedtime as needed for mild constipation.     [provider]  metFORMIN (GLUCOPHAGE) 500 MG tablet Take 500 mg by mouth daily. 02/05/20   [provider]  metoprolol tartrate (LOPRESSOR) 50 MG tablet Take 1.5 tablets (75 mg total) by mouth 2 (two) times daily. 02/05/18 04/04/19  Camnitz, WiOcie DoyneMD  Multiple Vitamin (MULTIVITAMIN WITH MINERALS) TABS tablet Take 1 tablet by mouth daily. 12/05/17   ThEugenie FillerMD  naproxen sodium (ALEVE) 220 MG tablet Take 220 mg by mouth every 12 (twelve) hours as needed (joint pain).    [provider]  neomycin-bacitracin-polymyxin (NEOSPORIN) ointment Apply 1 application topically as needed for wound care.    [provider]  neomycin-colistin-hydrocortisone-thonzonium (CORTISPORIN-TC) 3.01-06-09-0.5 MG/ML OTIC suspension Place 3 drops into the left ear 4 (four) times daily. Patient not taking: Reported  on 04/04/2019 11/15/18   Eppie Gibson, MD  nystatin (MYCOSTATIN) 100000 UNIT/ML suspension Take 5 mLs (500,000 Units total) by mouth 4 (four) times daily. Swish and gargle in mouth for a minute, then swallow. This should help thrush in mouth. Use QID until bottle is empty. 10/21/18   Eppie Gibson, MD  pantoprazole (PROTONIX) 40 MG tablet TAKE 1 TABLET BY MOUTH TWICE DAILY BEFORE A MEAL 05/30/18   Dena Billet B, PA-C  polyethylene glycol (MIRALAX / GLYCOLAX) packet Take 17 g by mouth 2 (two) times daily. 12/04/17   Eugenie Filler, MD  potassium chloride (K-DUR) 10 MEQ tablet TAKE 1 TABLET(10 MEQ) BY MOUTH DAILY Patient not taking: No sig reported 05/30/18   Dena Billet B, PA-C  tamsulosin (FLOMAX) 0.4 MG CAPS capsule TAKE 1 CAPSULE(0.4 MG) BY MOUTH DAILY Patient taking differently: Take 0.4 mg by mouth daily.  05/30/18   Orlena , PA-C  thiamine 100 MG tablet Take 1 tablet (100 mg total) by mouth daily. Patient not taking: Reported on 04/04/2019 12/05/17   Eugenie Filler, MD  traMADol Veatrice Bourbon) 50 MG tablet Take 50 mg by mouth daily as needed. 03/19/20   [provider]  vitamin B-12 (CYANOCOBALAMIN) 500 MCG tablet Take 500 mcg by mouth daily.    [provider]     Allergies    Patient has no known allergies.   Review of Systems   Review of Systems A comprehensive review of systems was completed and negative except as noted in HPI.    Physical Exam BP (!) 108/54   Pulse 64   Temp 98.1 F (36.7 C) (Oral)   Resp 11   Ht 5' 9"  (1.753 m)   Wt 87 kg   SpO2 100%   BMI 28.32 kg/m   Physical Exam Vitals and nursing note reviewed.  Constitutional:      Appearance: Normal appearance.  HENT:     Head: Normocephalic.     Comments: Abrasion to forehead    Nose: Nose normal.     Mouth/Throat:     Mouth: Mucous membranes are moist.  Eyes:     Extraocular Movements: Extraocular movements intact.     Conjunctiva/sclera: Conjunctivae normal.  Cardiovascular:     Rate and Rhythm: Normal rate.  Pulmonary:     Effort: Pulmonary effort is normal.     Breath sounds: Normal breath sounds.  Abdominal:     General: Abdomen is flat.     Palpations: Abdomen is soft.     Tenderness: There is no abdominal tenderness.  Musculoskeletal:        General: No swelling. Normal range of motion.     Cervical back: Neck supple.  Skin:    General: Skin is warm and dry.  Neurological:     General: No focal deficit present.     Mental Status: He is alert.  Psychiatric:        Mood and Affect: Mood normal.      ED Results / Procedures / Treatments   Labs (all labs ordered are listed, but only abnormal results are displayed) Labs Reviewed  COMPREHENSIVE METABOLIC PANEL - Abnormal; Notable for the following components:      Result Value   Sodium 131 (*)    Chloride 97 (*)    Glucose, Bld 105 (*)    BUN 26 (*)    Creatinine, Ser 1.61 (*)    Total Bilirubin 0.2 (*)    GFR calc non Af Amer 42 (*)  GFR calc Af Amer 48 (*)    All other components within normal limits  CBC WITH DIFFERENTIAL/PLATELET - Abnormal; Notable for the  following components:   RBC 3.43 (*)    Hemoglobin 10.4 (*)    HCT 30.5 (*)    All other components within normal limits  RESPIRATORY PANEL BY RT PCR (FLU A&B, COVID)  URINALYSIS, ROUTINE W REFLEX MICROSCOPIC    EKG EKG Interpretation  Date/Time:  Monday August 02 2020 20:44:24 EDT Ventricular Rate:  60 PR Interval:    QRS Duration: 168 QT Interval:  473 QTC Calculation: 473 R Axis:   -64 Text Interpretation: Sinus rhythm Left bundle branch block Since last tracing Pacer spikes no longer apparent Confirmed by Calvert Cantor [phone]) on 08/02/2020 8:47:56 PM   Radiology CT Head Wo Contrast  Result Date: 08/02/2020 CLINICAL DATA:  Seizure EXAM: CT HEAD WITHOUT CONTRAST CT CERVICAL SPINE WITHOUT CONTRAST TECHNIQUE: Multidetector CT imaging of the head and cervical spine was performed following the standard protocol without intravenous contrast. Multiplanar CT image reconstructions of the cervical spine were also generated. COMPARISON:  CT head 12/30/2018 FINDINGS: CT HEAD FINDINGS Brain: Interval development of large mass in the right occipital lobe. This is mildly hyperdense to cortex. This appears infiltrative and abuts the falx. The mass appears to cross the midline posterior to the splenium. There is edema in the splenium as well as in the white matter surrounding the mass. The mass measures approximately 6 x 4 cm. There is local mass-effect and mild midline shift to the left. No hydrocephalus. Negative for acute hemorrhage.  No acute infarct. Vascular: Negative for hyperdense vessel Skull: Negative Sinuses/Orbits: Negative Other: None CT CERVICAL SPINE FINDINGS Alignment: 1 mm anterolisthesis C3-4. Skull base and vertebrae: Negative for fracture or mass. Soft tissues and spinal canal: Negative for mass or adenopathy. Atherosclerotic calcification in the carotid artery bilaterally. Probable significant stenosis in the carotid artery bilaterally. Disc levels: Multilevel disc degeneration  and facet degeneration throughout the cervical spine with spurring and foraminal stenosis bilaterally due to spurring. Upper chest: Lung apices clear bilaterally. Other: None IMPRESSION: 1. 4 x 6 mm mass in the right occipital lobe with surrounding edema. The mass is mildly hyper dense to cortex. This is most likely a neoplasm. Favor glioblastoma. Differential diagnosis includes metastatic disease, lymphoma, meningioma. Recommend MRI brain without with contrast. 2. Negative for cervical spine fracture 3. Extensive atherosclerotic calcification of the carotid artery bilaterally, likely causing significant stenosis especially on the right. Electronically Signed   By: Franchot Gallo M.D.   On: 08/02/2020 20:43   CT Cervical Spine Wo Contrast  Result Date: 08/02/2020 CLINICAL DATA:  Seizure EXAM: CT HEAD WITHOUT CONTRAST CT CERVICAL SPINE WITHOUT CONTRAST TECHNIQUE: Multidetector CT imaging of the head and cervical spine was performed following the standard protocol without intravenous contrast. Multiplanar CT image reconstructions of the cervical spine were also generated. COMPARISON:  CT head 12/30/2018 FINDINGS: CT HEAD FINDINGS Brain: Interval development of large mass in the right occipital lobe. This is mildly hyperdense to cortex. This appears infiltrative and abuts the falx. The mass appears to cross the midline posterior to the splenium. There is edema in the splenium as well as in the white matter surrounding the mass. The mass measures approximately 6 x 4 cm. There is local mass-effect and mild midline shift to the left. No hydrocephalus. Negative for acute hemorrhage.  No acute infarct. Vascular: Negative for hyperdense vessel Skull: Negative Sinuses/Orbits: Negative Other: None CT CERVICAL SPINE FINDINGS  Alignment: 1 mm anterolisthesis C3-4. Skull base and vertebrae: Negative for fracture or mass. Soft tissues and spinal canal: Negative for mass or adenopathy. Atherosclerotic calcification in the  carotid artery bilaterally. Probable significant stenosis in the carotid artery bilaterally. Disc levels: Multilevel disc degeneration and facet degeneration throughout the cervical spine with spurring and foraminal stenosis bilaterally due to spurring. Upper chest: Lung apices clear bilaterally. Other: None IMPRESSION: 1. 4 x 6 mm mass in the right occipital lobe with surrounding edema. The mass is mildly hyper dense to cortex. This is most likely a neoplasm. Favor glioblastoma. Differential diagnosis includes metastatic disease, lymphoma, meningioma. Recommend MRI brain without with contrast. 2. Negative for cervical spine fracture 3. Extensive atherosclerotic calcification of the carotid artery bilaterally, likely causing significant stenosis especially on the right. Electronically Signed   By: Franchot Gallo M.D.   On: 08/02/2020 20:43    Procedures Procedures  Medications Ordered in the ED Medications  dexamethasone (DECADRON) injection 10 mg (has no administration in time range)  levETIRAcetam (KEPPRA) IVPB 1000 mg/100 mL premix (has no administration in time range)  LORazepam (ATIVAN) injection 2 mg (has no administration in time range)     MDM Rules/Calculators/A&P MDM Patient with reported seizures who is essentially baseline now. Will check labs and CT.  ED Course  I have reviewed the triage vital signs and the nursing notes.  Pertinent labs & imaging results that were available during my care of the patient were reviewed by me and considered in my medical decision making (see chart for details).  Clinical Course as of Aug 02 2158  Mon Aug 02, 2020  2028 CBC with mild anemia similar to baseline, otherwise normal.    [CS]  2043 CMP unchanged from baseline.    [CS]  2129 Reviewed CT images with radiologist, concern for primary brain malignancy, less likely metastatic disease from his prior cancer. Patient is back to baseline now. Spoke with Meyran NP, oncall for Neurosurgery. She  recommends Decadron for swelling and MRI w/wo contrast to further evaluation. Requests hospital[facility] admission at [facility]. She also asks that we discuss seizure management with Neurology. I spoke with Dr. Jonny Ruiz who recommends 1042m IV Keppra load and then increase his maintenance to 10043mBID. Patient also has a pacemaker, unsure if it is MRI compatible but this can be assessed in the AM. His devices is a [employer] Adapta L ADDRL1, S/N [id number].  Hospitalist paged to arrange admission.    [CS]  2157 Spoke with Dr. ShMarlyce HugeHospitalist, who will arrange for the patient to be admitted at [facility]  [CS]    Clinical Course User Index [CS] ShTruddie HiddenMD    Final Clinical Impression(s) / ED Diagnoses Final diagnoses:  [facility]    Rx / DC Orders ED Discharge Orders    None       ShTruddie HiddenMD 08/02/20 2159

## 2020-08-03 ENCOUNTER — Encounter (HOSPITAL_COMMUNITY): Payer: Self-pay | Admitting: Internal Medicine

## 2020-08-03 ENCOUNTER — Other Ambulatory Visit: Payer: Self-pay | Admitting: Neurosurgery

## 2020-08-03 ENCOUNTER — Observation Stay (HOSPITAL_COMMUNITY): Payer: Medicare HMO

## 2020-08-03 DIAGNOSIS — E1169 Type 2 diabetes mellitus with other specified complication: Secondary | ICD-10-CM | POA: Diagnosis present

## 2020-08-03 DIAGNOSIS — Z20822 Contact with and (suspected) exposure to covid-19: Secondary | ICD-10-CM | POA: Diagnosis present

## 2020-08-03 DIAGNOSIS — E782 Mixed hyperlipidemia: Secondary | ICD-10-CM

## 2020-08-03 DIAGNOSIS — F17210 Nicotine dependence, cigarettes, uncomplicated: Secondary | ICD-10-CM | POA: Diagnosis present

## 2020-08-03 DIAGNOSIS — I110 Hypertensive heart disease with heart failure: Secondary | ICD-10-CM | POA: Diagnosis present

## 2020-08-03 DIAGNOSIS — K219 Gastro-esophageal reflux disease without esophagitis: Secondary | ICD-10-CM | POA: Diagnosis present

## 2020-08-03 DIAGNOSIS — K900 Celiac disease: Secondary | ICD-10-CM | POA: Diagnosis not present

## 2020-08-03 DIAGNOSIS — F1021 Alcohol dependence, in remission: Secondary | ICD-10-CM | POA: Diagnosis present

## 2020-08-03 DIAGNOSIS — G936 Cerebral edema: Secondary | ICD-10-CM | POA: Diagnosis present

## 2020-08-03 DIAGNOSIS — I5032 Chronic diastolic (congestive) heart failure: Secondary | ICD-10-CM | POA: Diagnosis present

## 2020-08-03 DIAGNOSIS — I6523 Occlusion and stenosis of bilateral carotid arteries: Secondary | ICD-10-CM | POA: Diagnosis present

## 2020-08-03 DIAGNOSIS — E1165 Type 2 diabetes mellitus with hyperglycemia: Secondary | ICD-10-CM | POA: Diagnosis not present

## 2020-08-03 DIAGNOSIS — Z794 Long term (current) use of insulin: Secondary | ICD-10-CM

## 2020-08-03 DIAGNOSIS — H53462 Homonymous bilateral field defects, left side: Secondary | ICD-10-CM | POA: Diagnosis present

## 2020-08-03 DIAGNOSIS — D496 Neoplasm of unspecified behavior of brain: Secondary | ICD-10-CM | POA: Diagnosis not present

## 2020-08-03 DIAGNOSIS — R296 Repeated falls: Secondary | ICD-10-CM | POA: Diagnosis present

## 2020-08-03 DIAGNOSIS — F1011 Alcohol abuse, in remission: Secondary | ICD-10-CM | POA: Diagnosis not present

## 2020-08-03 DIAGNOSIS — E119 Type 2 diabetes mellitus without complications: Secondary | ICD-10-CM

## 2020-08-03 DIAGNOSIS — C7B1 Secondary Merkel cell carcinoma: Secondary | ICD-10-CM | POA: Diagnosis present

## 2020-08-03 DIAGNOSIS — G9341 Metabolic encephalopathy: Secondary | ICD-10-CM | POA: Diagnosis not present

## 2020-08-03 DIAGNOSIS — N400 Enlarged prostate without lower urinary tract symptoms: Secondary | ICD-10-CM | POA: Diagnosis present

## 2020-08-03 DIAGNOSIS — I951 Orthostatic hypotension: Secondary | ICD-10-CM | POA: Diagnosis present

## 2020-08-03 DIAGNOSIS — Z66 Do not resuscitate: Secondary | ICD-10-CM | POA: Diagnosis present

## 2020-08-03 DIAGNOSIS — G40909 Epilepsy, unspecified, not intractable, without status epilepticus: Secondary | ICD-10-CM | POA: Diagnosis present

## 2020-08-03 DIAGNOSIS — I10 Essential (primary) hypertension: Secondary | ICD-10-CM | POA: Diagnosis not present

## 2020-08-03 DIAGNOSIS — M109 Gout, unspecified: Secondary | ICD-10-CM | POA: Diagnosis present

## 2020-08-03 DIAGNOSIS — R569 Unspecified convulsions: Secondary | ICD-10-CM | POA: Diagnosis present

## 2020-08-03 DIAGNOSIS — R471 Dysarthria and anarthria: Secondary | ICD-10-CM | POA: Diagnosis not present

## 2020-08-03 DIAGNOSIS — J449 Chronic obstructive pulmonary disease, unspecified: Secondary | ICD-10-CM | POA: Diagnosis present

## 2020-08-03 DIAGNOSIS — I442 Atrioventricular block, complete: Secondary | ICD-10-CM | POA: Diagnosis present

## 2020-08-03 DIAGNOSIS — I421 Obstructive hypertrophic cardiomyopathy: Secondary | ICD-10-CM | POA: Diagnosis present

## 2020-08-03 LAB — CBC WITH DIFFERENTIAL/PLATELET
Abs Immature Granulocytes: 0.02 10*3/uL (ref 0.00–0.07)
Basophils Absolute: 0 10*3/uL (ref 0.0–0.1)
Basophils Relative: 0 %
Eosinophils Absolute: 0 10*3/uL (ref 0.0–0.5)
Eosinophils Relative: 0 %
HCT: 29.2 % — ABNORMAL LOW (ref 39.0–52.0)
Hemoglobin: 10.2 g/dL — ABNORMAL LOW (ref 13.0–17.0)
Immature Granulocytes: 0 %
Lymphocytes Relative: 6 %
Lymphs Abs: 0.4 10*3/uL — ABNORMAL LOW (ref 0.7–4.0)
MCH: 30.6 pg (ref 26.0–34.0)
MCHC: 34.9 g/dL (ref 30.0–36.0)
MCV: 87.7 fL (ref 80.0–100.0)
Monocytes Absolute: 0.1 10*3/uL (ref 0.1–1.0)
Monocytes Relative: 1 %
Neutro Abs: 5.9 10*3/uL (ref 1.7–7.7)
Neutrophils Relative %: 93 %
Platelets: 228 10*3/uL (ref 150–400)
RBC: 3.33 MIL/uL — ABNORMAL LOW (ref 4.22–5.81)
RDW: 14.6 % (ref 11.5–15.5)
WBC: 6.4 10*3/uL (ref 4.0–10.5)
nRBC: 0 % (ref 0.0–0.2)

## 2020-08-03 LAB — PROTIME-INR
INR: 1 (ref 0.8–1.2)
Prothrombin Time: 12.9 seconds (ref 11.4–15.2)

## 2020-08-03 LAB — MRSA PCR SCREENING: MRSA by PCR: NEGATIVE

## 2020-08-03 LAB — COMPREHENSIVE METABOLIC PANEL
ALT: 12 U/L (ref 0–44)
AST: 19 U/L (ref 15–41)
Albumin: 3.6 g/dL (ref 3.5–5.0)
Alkaline Phosphatase: 67 U/L (ref 38–126)
Anion gap: 10 (ref 5–15)
BUN: 24 mg/dL — ABNORMAL HIGH (ref 8–23)
CO2: 24 mmol/L (ref 22–32)
Calcium: 9.3 mg/dL (ref 8.9–10.3)
Chloride: 99 mmol/L (ref 98–111)
Creatinine, Ser: 1.35 mg/dL — ABNORMAL HIGH (ref 0.61–1.24)
GFR calc Af Amer: 60 mL/min — ABNORMAL LOW (ref >60)
GFR calc non Af Amer: 51 mL/min — ABNORMAL LOW (ref >60)
Glucose, Bld: 154 mg/dL — ABNORMAL HIGH (ref 70–99)
Potassium: 4.4 mmol/L (ref 3.5–5.1)
Sodium: 133 mmol/L — ABNORMAL LOW (ref 135–145)
Total Bilirubin: 0.5 mg/dL (ref 0.3–1.2)
Total Protein: 6.9 g/dL (ref 6.5–8.1)

## 2020-08-03 LAB — GLUCOSE, CAPILLARY
Glucose-Capillary: 140 mg/dL — ABNORMAL HIGH (ref 70–99)
Glucose-Capillary: 159 mg/dL — ABNORMAL HIGH (ref 70–99)
Glucose-Capillary: 163 mg/dL — ABNORMAL HIGH (ref 70–99)
Glucose-Capillary: 176 mg/dL — ABNORMAL HIGH (ref 70–99)

## 2020-08-03 LAB — APTT: aPTT: 32 seconds (ref 24–36)

## 2020-08-03 LAB — HEMOGLOBIN A1C
Hgb A1c MFr Bld: 6.9 % — ABNORMAL HIGH (ref 4.8–5.6)
Mean Plasma Glucose: 151.33 mg/dL

## 2020-08-03 LAB — MAGNESIUM: Magnesium: 1.5 mg/dL — ABNORMAL LOW (ref 1.7–2.4)

## 2020-08-03 MED ORDER — ALLOPURINOL 300 MG PO TABS
300.0000 mg | ORAL_TABLET | Freq: Every day | ORAL | Status: DC
  Administered 2020-08-03 – 2020-08-16 (×13): 300 mg via ORAL
  Filled 2020-08-03 (×13): qty 1

## 2020-08-03 MED ORDER — ONDANSETRON HCL 4 MG PO TABS: 4.0000 mg | ORAL_TABLET | Freq: Four times a day (QID) | ORAL | Status: DC | PRN

## 2020-08-03 MED ORDER — THIAMINE HCL 100 MG PO TABS
100.0000 mg | ORAL_TABLET | Freq: Every day | ORAL | Status: DC
  Administered 2020-08-03 – 2020-08-16 (×13): 100 mg via ORAL
  Filled 2020-08-03 (×13): qty 1

## 2020-08-03 MED ORDER — ALUM & MAG HYDROXIDE-SIMETH 200-200-20 MG/5ML PO SUSP: 30.0000 mL | ORAL | Status: DC | PRN

## 2020-08-03 MED ORDER — LEVETIRACETAM 500 MG PO TABS
1000.0000 mg | ORAL_TABLET | Freq: Two times a day (BID) | ORAL | Status: DC
  Administered 2020-08-03 – 2020-08-08 (×11): 1000 mg via ORAL
  Filled 2020-08-03 (×11): qty 2

## 2020-08-03 MED ORDER — METOPROLOL TARTRATE 50 MG PO TABS
75.0000 mg | ORAL_TABLET | Freq: Two times a day (BID) | ORAL | Status: DC
  Administered 2020-08-03 – 2020-08-12 (×19): 75 mg via ORAL
  Filled 2020-08-03 (×21): qty 1

## 2020-08-03 MED ORDER — TRAMADOL HCL 50 MG PO TABS
50.0000 mg | ORAL_TABLET | Freq: Two times a day (BID) | ORAL | Status: DC | PRN
  Administered 2020-08-06: 50 mg via ORAL
  Filled 2020-08-03: qty 1

## 2020-08-03 MED ORDER — IOHEXOL 300 MG/ML  SOLN
100.0000 mL | Freq: Once | INTRAMUSCULAR | Status: AC | PRN
  Administered 2020-08-03: 100 mL via INTRAVENOUS

## 2020-08-03 MED ORDER — MAGNESIUM HYDROXIDE 400 MG/5ML PO SUSP
30.0000 mL | Freq: Every evening | ORAL | Status: DC | PRN
  Administered 2020-08-08: 30 mL via ORAL
  Filled 2020-08-03: qty 30

## 2020-08-03 MED ORDER — GUAIFENESIN ER 600 MG PO TB12
600.0000 mg | ORAL_TABLET | Freq: Two times a day (BID) | ORAL | Status: DC | PRN
  Filled 2020-08-03: qty 2

## 2020-08-03 MED ORDER — ONDANSETRON HCL 4 MG/2ML IJ SOLN
4.0000 mg | Freq: Four times a day (QID) | INTRAMUSCULAR | Status: DC | PRN
  Administered 2020-08-04: 4 mg via INTRAVENOUS

## 2020-08-03 MED ORDER — TAMSULOSIN HCL 0.4 MG PO CAPS
0.4000 mg | ORAL_CAPSULE | Freq: Every day | ORAL | Status: DC
  Administered 2020-08-03 – 2020-08-14 (×11): 0.4 mg via ORAL
  Filled 2020-08-03 (×11): qty 1

## 2020-08-03 MED ORDER — FOLIC ACID 1 MG PO TABS
1.0000 mg | ORAL_TABLET | Freq: Every day | ORAL | Status: DC
  Administered 2020-08-03 – 2020-08-16 (×13): 1 mg via ORAL
  Filled 2020-08-03 (×13): qty 1

## 2020-08-03 MED ORDER — DEXAMETHASONE SODIUM PHOSPHATE 4 MG/ML IJ SOLN
4.0000 mg | Freq: Four times a day (QID) | INTRAMUSCULAR | Status: DC
  Administered 2020-08-03 – 2020-08-04 (×5): 4 mg via INTRAVENOUS
  Filled 2020-08-03 (×5): qty 1

## 2020-08-03 MED ORDER — ACETAMINOPHEN 325 MG PO TABS
650.0000 mg | ORAL_TABLET | Freq: Four times a day (QID) | ORAL | Status: DC | PRN
  Administered 2020-08-05 – 2020-08-06 (×2): 650 mg via ORAL
  Filled 2020-08-03 (×3): qty 2

## 2020-08-03 MED ORDER — INSULIN ASPART 100 UNIT/ML ~~LOC~~ SOLN
0.0000 [IU] | Freq: Four times a day (QID) | SUBCUTANEOUS | Status: DC
  Administered 2020-08-03: 3 [IU] via SUBCUTANEOUS
  Administered 2020-08-03: 2 [IU] via SUBCUTANEOUS
  Administered 2020-08-03 – 2020-08-04 (×2): 3 [IU] via SUBCUTANEOUS
  Administered 2020-08-04 (×2): 2 [IU] via SUBCUTANEOUS
  Administered 2020-08-05: 3 [IU] via SUBCUTANEOUS
  Administered 2020-08-05: 2 [IU] via SUBCUTANEOUS
  Administered 2020-08-05 (×2): 3 [IU] via SUBCUTANEOUS
  Administered 2020-08-06: 2 [IU] via SUBCUTANEOUS
  Administered 2020-08-06 (×2): 3 [IU] via SUBCUTANEOUS
  Administered 2020-08-07: 2 [IU] via SUBCUTANEOUS
  Administered 2020-08-07: 3 [IU] via SUBCUTANEOUS
  Administered 2020-08-07 – 2020-08-08 (×3): 2 [IU] via SUBCUTANEOUS
  Administered 2020-08-08: 3 [IU] via SUBCUTANEOUS
  Filled 2020-08-03: qty 0.15

## 2020-08-03 MED ORDER — VITAMIN B-12 100 MCG PO TABS
500.0000 ug | ORAL_TABLET | Freq: Every day | ORAL | Status: DC
  Administered 2020-08-03 – 2020-08-16 (×13): 500 ug via ORAL
  Filled 2020-08-03 (×5): qty 5
  Filled 2020-08-03 (×2): qty 1
  Filled 2020-08-03 (×5): qty 5
  Filled 2020-08-03: qty 1

## 2020-08-03 MED ORDER — LISINOPRIL 20 MG PO TABS
20.0000 mg | ORAL_TABLET | Freq: Every day | ORAL | Status: DC
  Administered 2020-08-03 – 2020-08-12 (×9): 20 mg via ORAL
  Filled 2020-08-03 (×9): qty 1

## 2020-08-03 MED ORDER — PANTOPRAZOLE SODIUM 40 MG PO TBEC
40.0000 mg | DELAYED_RELEASE_TABLET | Freq: Two times a day (BID) | ORAL | Status: DC
  Administered 2020-08-03 – 2020-08-16 (×23): 40 mg via ORAL
  Filled 2020-08-03 (×25): qty 1

## 2020-08-03 MED ORDER — ATORVASTATIN CALCIUM 10 MG PO TABS
20.0000 mg | ORAL_TABLET | Freq: Every day | ORAL | Status: DC
  Administered 2020-08-03 – 2020-08-16 (×13): 20 mg via ORAL
  Filled 2020-08-03 (×13): qty 2

## 2020-08-03 MED ORDER — ACETAMINOPHEN 650 MG RE SUPP: 650.0000 mg | Freq: Four times a day (QID) | RECTAL | Status: DC | PRN

## 2020-08-03 MED ORDER — POLYETHYLENE GLYCOL 3350 17 G PO PACK
17.0000 g | PACK | Freq: Every day | ORAL | Status: DC | PRN
  Administered 2020-08-08 – 2020-08-12 (×3): 17 g via ORAL
  Filled 2020-08-03 (×3): qty 1

## 2020-08-03 NOTE — Progress Notes (Signed)
Patient has an ADAPTA DR pacer which is deemed unsafe for scanning per [employer]. Cannot perform MRI. [place] sent to Dr. Marcello Moores.

## 2020-08-03 NOTE — Consult Note (Signed)
Reason for Consult: Brain tumor Referring Physician: Emergency department hospitalist  Francisco [name] is an 74 y.o. male.  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  Evaluation with head CT showed a right occipital mass patient has been admitted to the medicine service for metastatic work-up including brain MRI with without contrast IV Decadron.  Past Medical History:  Diagnosis Date  . Anemia   . Anginal pain ([place])    normal coronaries 2016  . Celiac disease   . CHF (congestive heart failure) ([place])   . COPD (chronic obstructive pulmonary disease) ([place])   . GERD (gastroesophageal reflux disease)   . Heart murmur   . History of radiation therapy 10/07/18- 11/04/18   Parotid, left 50 Gy in 20 fractions of 2.5 Gy.   Marland Kitchen Hypertension   . Multiple duodenal ulcers   . parotid gland cancer Left   . Presence of permanent cardiac pacemaker   . Right internal carotid occlusion   . Seizures ([place])   . Shortness of breath   . Symptomatic Bradycardia    a. 07/2015 s/p MDT Advisa L DC PPM (Ser #: [id number]).  . 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 CARDIAC CATHETERIZATION N/A 07/08/2015   Procedure: Left Heart Cath and Coronary Angiography;  Surgeon: Troy Sine, MD;  Location: [facility];  Service: Cardiovascular;  Laterality: N/A;  . CARDIAC CATHETERIZATION N/A 07/08/2015   Procedure: Temporary Pacemaker;  Surgeon: Troy Sine, MD;  Location: [facility];  Service: Cardiovascular;  Laterality: N/A;  . CARDIAC CATHETERIZATION N/A 07/09/2015   Procedure: Temporary Pacemaker;  Surgeon: Troy Sine, MD;  Location: [facility];   Service: Cardiovascular;  Laterality: N/A;  . EP IMPLANTABLE DEVICE N/A 07/09/2015   Procedure: Pacemaker Implant;  Surgeon: Will Meredith Leeds, MD;  Location: [facility];  Service: Cardiovascular;  Laterality: N/A;  . ESOPHAGOGASTRODUODENOSCOPY  07/03/2012   Procedure: ESOPHAGOGASTRODUODENOSCOPY (EGD);  Surgeon: Winfield Cunas., MD;  Location[facility];  Service: Endoscopy;  Laterality: N/A;  . HEMORRHOID SURGERY    . HERNIA REPAIR    . MOLE REMOVAL    . PAROTIDECTOMY Left 08/21/2018   superficial  . PAROTIDECTOMY Left 08/21/2018   Procedure: LEFT SUPERFICIAL PAROTIDECTOMY;  Surgeon: Helayne Seminole, MD;  Location: [facility] OR;  Service: ENT;  Laterality: Left;    Family History  Problem Relation Age of Onset  . Hodgkin's lymphoma Father     Social History:  reports that he has been smoking cigarettes. He has a 12.50 pack-year smoking history. He uses smokeless tobacco. He reports previous alcohol use. He reports that he does not use drugs.  Allergies: No Known Allergies  Medications: I have reviewed the patient's current medications.  Results for orders placed or performed during the hospital encounter of 08/02/20 (from the past 48 hour(s))  Comprehensive metabolic panel     Status: Abnormal   Collection Time: 08/02/20  7:30 PM  Result Value Ref Range   Sodium 131 (L) 135 - 145 mmol/L   Potassium 3.9 3.5 - 5.1 mmol/L   Chloride 97 (L) 98 - 111 mmol/L   CO2 26 22 - 32 mmol/L   Glucose, Bld 105 (H) 70 -  99 mg/dL    Comment: Glucose reference range applies only to samples taken after fasting for at least 8 hours.   BUN 26 (H) 8 - 23 mg/dL   Creatinine, Ser 1.61 (H) 0.61 - 1.24 mg/dL   Calcium 9.2 8.9 - 10.3 mg/dL   Total Protein 7.2 6.5 - 8.1 g/dL   Albumin 4.0 3.5 - 5.0 g/dL   AST 21 15 - 41 U/L   ALT 14 0 - 44 U/L   Alkaline Phosphatase 70 38 - 126 U/L   Total Bilirubin 0.2 (L) 0.3 - 1.2 mg/dL   GFR calc non Af Amer 42 (L) >60 mL/min   GFR calc Af Amer 48  (L) >60 mL/min   Anion gap 8 5 - 15    Comment: Performed at [facility], [place] [address] [place], [place] [address]  CBC with Differential     Status: Abnormal   Collection Time: 08/02/20  7:30 PM  Result Value Ref Range   WBC 8.3 4.0 - 10.5 K/uL   RBC 3.43 (L) 4.22 - 5.81 MIL/uL   Hemoglobin 10.4 (L) 13.0 - 17.0 g/dL   HCT 30.5 (L) 39 - 52 %   MCV 88.9 80.0 - 100.0 fL   MCH 30.3 26.0 - 34.0 pg   MCHC 34.1 30.0 - 36.0 g/dL   RDW 14.8 11.5 - 15.5 %   Platelets 240 150 - 400 K/uL   nRBC 0.0 0.0 - 0.2 %   Neutrophils Relative % 74 %   Neutro Abs 6.1 1.7 - 7.7 K/uL   Lymphocytes Relative 17 %   Lymphs Abs 1.4 0.7 - 4.0 K/uL   Monocytes Relative 6 %   Monocytes Absolute 0.5 0 - 1 K/uL   Eosinophils Relative 3 %   Eosinophils Absolute 0.3 0 - 0 K/uL   Basophils Relative 0 %   Basophils Absolute 0.0 0 - 0 K/uL   Immature Granulocytes 0 %   Abs Immature Granulocytes 0.02 0.00 - 0.07 K/uL    Comment: Performed at [facility], [place] [address] [place], [place] [address]  Urinalysis, Routine w reflex microscopic Urine, Clean Catch     Status: None   Collection Time: 08/02/20 10:08 PM  Result Value Ref Range   Color, Urine YELLOW YELLOW   APPearance CLEAR CLEAR   Specific Gravity, Urine 1.009 1.005 - 1.030   pH 5.0 5.0 - 8.0   Glucose, UA NEGATIVE NEGATIVE mg/dL   Hgb urine dipstick NEGATIVE NEGATIVE   Bilirubin Urine NEGATIVE NEGATIVE   Ketones, ur NEGATIVE NEGATIVE mg/dL   Protein, ur NEGATIVE NEGATIVE mg/dL   Nitrite NEGATIVE NEGATIVE   Leukocytes,Ua NEGATIVE NEGATIVE    Comment: Performed at [facility] [address] [place], [place] [address]  Respiratory Panel by RT PCR (Flu A&B, Covid) - Urine, Clean Catch     Status: None   Collection Time: 08/02/20 10:08 PM   Specimen: Urine, Clean Catch; Nasopharyngeal  Result Value Ref Range   SARS Coronavirus 2 by RT PCR NEGATIVE NEGATIVE    Comment: (NOTE) SARS-CoV-2  target nucleic acids are NOT DETECTED.  The SARS-CoV-2 RNA is generally detectable in upper respiratoy specimens during the acute phase of infection. The lowest concentration of SARS-CoV-2 viral copies this assay can detect is 131 copies/mL. A negative result does not preclude SARS-Cov-2 infection and should not be used as the sole basis for treatment or other patient management decisions. A negative result may occur with  improper specimen collection/handling, submission of specimen other than nasopharyngeal swab, presence of viral mutation(s) within the areas targeted by this assay, and inadequate number of viral copies (<131 copies/mL). A negative result must be combined with clinical observations, patient history, and epidemiological information. The expected result is Negative.  Fact Sheet for Patients:  PinkCheek.be  Fact Sheet for Healthcare Providers:  GravelBags.it  This test is no t yet approved or cleared by the [place] FDA and  has been authorized for detection and/or diagnosis of SARS-CoV-2 by FDA under an Emergency Use Authorization (EUA). This EUA will remain  in effect (meaning this test can be used) for the duration of the COVID-19 declaration under Section 564(b)(1) of the Act, 21 U.S.C. section 360bbb-3(b)(1), unless the authorization is terminated or revoked sooner.     Influenza A by PCR NEGATIVE NEGATIVE   Influenza B by PCR NEGATIVE NEGATIVE    Comment: (NOTE) The Xpert Xpress SARS-CoV-2/FLU/RSV assay is intended as an aid in  the diagnosis of influenza from Nasopharyngeal swab specimens and  should not be used as a sole basis for treatment. Nasal washings and  aspirates are unacceptable for Xpert Xpress SARS-CoV-2/FLU/RSV  testing.  Fact Sheet for Patients: PinkCheek.be  Fact Sheet for Healthcare Providers: GravelBags.it  This  test is not yet approved or cleared by the [place] FDA and  has been authorized for detection and/or diagnosis of SARS-CoV-2 by  FDA under an Emergency Use Authorization (EUA). This EUA will remain  in effect (meaning this test can be used) for the duration of the  Covid-19 declaration under Section 564(b)(1) of the Act, 21  U.S.C. section 360bbb-3(b)(1), unless the authorization is  terminated or revoked. Performed at [facility], [place] [address], [place], [place] [address]   Hemoglobin A1c     Status: Abnormal   Collection Time: 08/03/20 12:43 AM  Result Value Ref Range   Hgb A1c MFr Bld 6.9 (H) 4.8 - 5.6 %    Comment: (NOTE) Pre diabetes:          5.7%-6.4%  Diabetes:              >6.4%  Glycemic control for   <7.0% adults with diabetes    Mean Plasma Glucose 151.33 mg/dL    Comment: Performed at [facility] [address], [place], [place] [address]  Magnesium     Status: Abnormal   Collection Time: 08/03/20  3:46 AM  Result Value Ref Range   Magnesium 1.5 (L) 1.7 - 2.4 mg/dL    Comment: Performed at [facility], [place] [address], [place], [place] [address]  CBC WITH DIFFERENTIAL     Status: Abnormal   Collection Time: 08/03/20  3:46 AM  Result Value Ref Range   WBC 6.4 4.0 - 10.5 K/uL   RBC 3.33 (L) 4.22 - 5.81 MIL/uL   Hemoglobin 10.2 (L) 13.0 - 17.0 g/dL   HCT 29.2 (L) 39 - 52 %   MCV 87.7 80.0 - 100.0 fL   MCH 30.6 26.0 - 34.0 pg   MCHC 34.9 30.0 - 36.0 g/dL   RDW 14.6 11.5 - 15.5 %   Platelets 228 150 - 400 K/uL   nRBC 0.0 0.0 - 0.2 %   Neutrophils Relative % 93 %   Neutro Abs 5.9 1.7 - 7.7 K/uL   Lymphocytes Relative 6 %   Lymphs Abs 0.4 (L) 0.7 - 4.0 K/uL   Monocytes Relative 1 %   Monocytes  Absolute 0.1 0 - 1 K/uL   Eosinophils Relative 0 %   Eosinophils Absolute 0.0 0 - 0 K/uL   Basophils Relative 0 %   Basophils Absolute 0.0 0 - 0 K/uL   Immature Granulocytes 0 %   Abs Immature Granulocytes 0.02 0.00  - 0.07 K/uL    Comment: Performed at [facility], [place] [address], [place], [place] [address]  Comprehensive metabolic panel     Status: Abnormal   Collection Time: 08/03/20  3:46 AM  Result Value Ref Range   Sodium 133 (L) 135 - 145 mmol/L   Potassium 4.4 3.5 - 5.1 mmol/L   Chloride 99 98 - 111 mmol/L   CO2 24 22 - 32 mmol/L   Glucose, Bld 154 (H) 70 - 99 mg/dL    Comment: Glucose reference range applies only to samples taken after fasting for at least 8 hours.   BUN 24 (H) 8 - 23 mg/dL   Creatinine, Ser 1.35 (H) 0.61 - 1.24 mg/dL   Calcium 9.3 8.9 - 10.3 mg/dL   Total Protein 6.9 6.5 - 8.1 g/dL   Albumin 3.6 3.5 - 5.0 g/dL   AST 19 15 - 41 U/L   ALT 12 0 - 44 U/L   Alkaline Phosphatase 67 38 - 126 U/L   Total Bilirubin 0.5 0.3 - 1.2 mg/dL   GFR calc non Af Amer 51 (L) >60 mL/min   GFR calc Af Amer 60 (L) >60 mL/min   Anion gap 10 5 - 15    Comment: Performed at [facility], [place] [address], [place], [place] [address]  APTT     Status: None   Collection Time: 08/03/20  3:46 AM  Result Value Ref Range   aPTT 32 24 - 36 seconds    Comment: Performed at [facility], [place] [address], [place], [place] [address]  Protime-INR     Status: None   Collection Time: 08/03/20  3:46 AM  Result Value Ref Range   Prothrombin Time 12.9 11.4 - 15.2 seconds   INR 1.0 0.8 - 1.2    Comment: (NOTE) INR goal varies based on device and disease states. Performed at [facility], [place] [address] [place], [place] [address]   Glucose, capillary     Status: Abnormal   Collection Time: 08/03/20  6:50 AM  Result Value Ref Range   Glucose-Capillary 159 (H) 70 - 99 mg/dL    Comment: Glucose reference range applies only to samples taken after fasting for at least 8 hours.    CT Head Wo Contrast  Result Date: 08/02/2020 CLINICAL DATA:  Seizure EXAM: CT HEAD WITHOUT CONTRAST CT CERVICAL SPINE WITHOUT CONTRAST TECHNIQUE:  Multidetector CT imaging of the head and cervical spine was performed following the standard protocol without intravenous contrast. Multiplanar CT image reconstructions of the cervical spine were also generated. COMPARISON:  CT head 12/30/2018 FINDINGS: CT HEAD FINDINGS Brain: Interval development of large mass in the right occipital lobe. This is mildly hyperdense to cortex. This appears infiltrative and abuts the falx. The mass appears to cross the midline posterior to the splenium. There is edema in the splenium as well as in the white matter surrounding the mass. The mass measures approximately 6 x 4 cm. There is local mass-effect and mild midline shift to the left. No hydrocephalus. Negative for acute hemorrhage.  No acute infarct. Vascular: Negative for hyperdense vessel Skull: Negative Sinuses/Orbits: Negative Other: None CT CERVICAL SPINE FINDINGS  Alignment: 1 mm anterolisthesis C3-4. Skull base and vertebrae: Negative for fracture or mass. Soft tissues and spinal canal: Negative for mass or adenopathy. Atherosclerotic calcification in the carotid artery bilaterally. Probable significant stenosis in the carotid artery bilaterally. Disc levels: Multilevel disc degeneration and facet degeneration throughout the cervical spine with spurring and foraminal stenosis bilaterally due to spurring. Upper chest: Lung apices clear bilaterally. Other: None IMPRESSION: 1. 4 x 6 mm mass in the right occipital lobe with surrounding edema. The mass is mildly hyper dense to cortex. This is most likely a neoplasm. Favor glioblastoma. Differential diagnosis includes metastatic disease, lymphoma, meningioma. Recommend MRI brain without with contrast. 2. Negative for cervical spine fracture 3. Extensive atherosclerotic calcification of the carotid artery bilaterally, likely causing significant stenosis especially on the right. Electronically Signed   By: Franchot Gallo M.D.   On: 08/02/2020 20:43   CT Cervical Spine Wo  Contrast  Result Date: 08/02/2020 CLINICAL DATA:  Seizure EXAM: CT HEAD WITHOUT CONTRAST CT CERVICAL SPINE WITHOUT CONTRAST TECHNIQUE: Multidetector CT imaging of the head and cervical spine was performed following the standard protocol without intravenous contrast. Multiplanar CT image reconstructions of the cervical spine were also generated. COMPARISON:  CT head 12/30/2018 FINDINGS: CT HEAD FINDINGS Brain: Interval development of large mass in the right occipital lobe. This is mildly hyperdense to cortex. This appears infiltrative and abuts the falx. The mass appears to cross the midline posterior to the splenium. There is edema in the splenium as well as in the white matter surrounding the mass. The mass measures approximately 6 x 4 cm. There is local mass-effect and mild midline shift to the left. No hydrocephalus. Negative for acute hemorrhage.  No acute infarct. Vascular: Negative for hyperdense vessel Skull: Negative Sinuses/Orbits: Negative Other: None CT CERVICAL SPINE FINDINGS Alignment: 1 mm anterolisthesis C3-4. Skull base and vertebrae: Negative for fracture or mass. Soft tissues and spinal canal: Negative for mass or adenopathy. Atherosclerotic calcification in the carotid artery bilaterally. Probable significant stenosis in the carotid artery bilaterally. Disc levels: Multilevel disc degeneration and facet degeneration throughout the cervical spine with spurring and foraminal stenosis bilaterally due to spurring. Upper chest: Lung apices clear bilaterally. Other: None IMPRESSION: 1. 4 x 6 mm mass in the right occipital lobe with surrounding edema. The mass is mildly hyper dense to cortex. This is most likely a neoplasm. Favor glioblastoma. Differential diagnosis includes metastatic disease, lymphoma, meningioma. Recommend MRI brain without with contrast. 2. Negative for cervical spine fracture 3. Extensive atherosclerotic calcification of the carotid artery bilaterally, likely causing significant  stenosis especially on the right. Electronically Signed   By: Franchot Gallo M.D.   On: 08/02/2020 20:43    Review of Systems  Unable to perform ROS: Acuity of condition   Blood pressure (!) 113/55, pulse 74, temperature 98.2 F (36.8 C), temperature source Oral, resp. rate 14, height 5' 9"  (1.753 m), weight 87 kg, SpO2 97 %. Physical Exam Neurological:     Mental Status: He is alert.     Comments: Awake and alert confused can tell me his name is not oriented to time or place.  Has a left-sided field cut but moves all extremities well with 5 out of 5 strength pupils are equal and extraocular movements are intact     Assessment/Plan: 74 year old with new diagnosis of right occipital mass unclear whether this is metastatic or primary awaiting brain MRI neurology on board to help treat his seizures patient was loaded on Keppra last night also  given IV Decadron.  We will follow-up brain MRI patient can be mobilized with physical and Occupational Therapy.  Elaina Hoops 08/03/2020, 8:07 AM

## 2020-08-03 NOTE — Progress Notes (Signed)
Reached out to on call Dr to clarify pending orders for the OR tomorrow and spoke with Marveen Reeks NP on call.

## 2020-08-03 NOTE — Progress Notes (Signed)
Neurosurgery  Asked by my colleague Dr. Saintclair Halsted to evaluate this patient.  74 yo M with hx of parotid gland/Merkel cell CA s/p XRT, CHF, COPD, alcoholism who presented with a seizure, found to have a right occipital mass.  He has a left homonymous hemianopsia, left pronator drift, and mild cognitive deficits noticed.  He will require MRI brain with/without contrast including Brainlab protocol for further characterization.  His pacemaker will have to be identified to see if it is MRI compatible.  He will also need metastatic workup with CT chest/Abd/pelvis, as well as CT head with/without contrast with BrainLab protocol.  Further recommendations will be given pending completion of these studies.

## 2020-08-03 NOTE — ED Notes (Signed)
Spoke with [facility] for transfer.

## 2020-08-03 NOTE — Progress Notes (Addendum)
Same day note Admitted overnight  Patient seen and examined at bedside.  Patient was admitted to the hospital for recurrent seizures  At the time of my evaluation, patient complains of feeling ok, no headache or focal weakness  Physical examination reveals elderly male, alert awake and communicative, not in obvious distress. Moving all extremities.  Occasionally gets confused.  Laboratory data and imaging was reviewed  Assessment and Plan.  Recurrent seizures likely secondary to brain mass in the right occipital region.  Could be metastatic.  Neurosurgery on board.  On IV Decadron.  On Keppra as per neurology recommendations.  Follow neurology and neurosurgery opinion.  Unable to do MRI as per the nursing staff due to pacer.  CT head with and without contrast, CT chest abdomen and pelvis has been ordered.  Hypertension.  Continue lisinopril, metoprolol.  She is also on amlodipine at home.  History of parotid gland cancer status post radiation treatment.  Chronic diastolic heart failure.  Currently compensated.  Will discontinue IV fluids.  Continue lisinopril.  History of seizure disorder.  Continue Keppra  Type 2 diabetes mellitus.  On Metformin at home.  We will add sliding scale insulin since patient is on Decadron.  History of alcohol abuse.  On thiamine and folic acid.  Hyperlipidemia.  Continue statin  DNR noted.  I tried to reach the patient's sister Ms. Rabek, [name] on the phone but was unable to reach her.  No Charge  Signed,  Delila Pereyra, MD [facility]

## 2020-08-03 NOTE — H&P (Addendum)
History and Physical    Francisco [name] KZS:[id number] DOB: Jul 10, 1946 DOA: 08/02/2020  PCP: Lesia Hausen, PA  Patient coming from: [place]   Chief Complaint:  Chief Complaint  Patient presents with  . 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  Patient does not recall any of the events surrounding seizures that he experienced earlier in the day on 9/27.  According to EMS staff and emergency department staff, patient exhibited 3 witnessed seizures while at his facility.  Each seizure was brief, self-limited without termination with medications, and followed by what appeared to be a postictal state.  After the third seizure, EMS was called and the patient was brought into [facility] emergency department for evaluation.  Upon further discussion with the patient, he reports that for the past 3 weeks he has noticed that his legs have progressively gotten weaker.  He states that his right leg is weaker than his left leg.  This is resulted in frequent falls over the span of time without loss of consciousness.  Patient also noticed that he is having problems with his peripheral vision.  Stating that he has realized that for at least the past 2 weeks  he is unable to see things in his visual periphery.  He additionally complains of associated blurry vision, particularly from the left eye.  He is unable to provide further details pertaining to this.  Patient denies any associated headaches, vertigo nausea or vomiting.  Upon evaluation in the emergency department patient has  not had any further observed seizure-like activity.  Upon thorough evaluation in the emergency department CT imaging of the head did reveal a 6 cm x 4 cm new lesion in the right occipital region with associated surrounding edema and evidence of a midline shift to the left.  Emergency department staff discussed these findings with NP Meyran with neurosurgery who recommended initiation of intravenous Decadron and transferring the patient to [facility] for neurosurgical consultation.  She also recommended obtaining an MRI if possible.  Case was additionally discussed with Dr. Jonny Ruiz with neurology who recommended initiation of 1000 mg of Keppra x1 followed by increasing patient's home regimen of Keppra to 1000 mg twice daily starting tomorrow.  The hospitalist group was then called to assess the patient for admission to the hospital.  Review of Systems:   ROS  Past Medical History:  Diagnosis Date  . Anemia   . Anginal pain ([place])    normal coronaries 2016  . Celiac disease   . CHF (congestive heart failure) ([place])   . COPD (chronic obstructive pulmonary disease) ([place])   . GERD (gastroesophageal reflux disease)   . Heart murmur   . History of radiation therapy 10/07/18- 11/04/18   Parotid, left 50 Gy in 20 fractions of 2.5 Gy.   Marland Kitchen Hypertension   . Multiple duodenal ulcers   . parotid gland cancer Left   . Presence of permanent cardiac pacemaker   . Right internal carotid occlusion   . Seizures ([place])   . Shortness of breath   . Symptomatic Bradycardia    a. 07/2015 s/p MDT Advisa L DC PPM (Ser #: [id number]).  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 CARDIAC CATHETERIZATION N/A 07/08/2015   Procedure: Left Heart Cath and Coronary Angiography;  Surgeon: Troy Sine, MD;  Location: [place] [facility];  Service: Cardiovascular;  Laterality: N/A;  . CARDIAC CATHETERIZATION N/A 07/08/2015   Procedure:  Temporary Pacemaker;  Surgeon: Troy Sine, MD;  Location: [place] [facility];  Service: Cardiovascular;  Laterality: N/A;  . CARDIAC CATHETERIZATION N/A 07/09/2015   Procedure: Temporary Pacemaker;  Surgeon: Troy Sine, MD;  Location: [place] [facility];  Service: Cardiovascular;  Laterality: N/A;  . EP IMPLANTABLE DEVICE N/A 07/09/2015   Procedure: Pacemaker Implant;  Surgeon: Will Meredith Leeds, MD;  Location: [place] [facility];  Service: Cardiovascular;  Laterality: N/A;  . ESOPHAGOGASTRODUODENOSCOPY  07/03/2012   Procedure: ESOPHAGOGASTRODUODENOSCOPY (EGD);  Surgeon: Winfield Cunas., MD;  Location: [facility];  Service: Endoscopy;  Laterality: N/A;  . HEMORRHOID SURGERY    . HERNIA REPAIR    . MOLE REMOVAL    . PAROTIDECTOMY Left 08/21/2018   superficial  . PAROTIDECTOMY Left 08/21/2018   Procedure: LEFT SUPERFICIAL PAROTIDECTOMY;  Surgeon: Helayne Seminole, MD;  Location: [place];  Service: ENT;  Laterality: Left;     reports that he has been smoking cigarettes. He has a 12.50 pack-year smoking history. He uses smokeless tobacco. He reports previous alcohol use. He reports that he does not use drugs.  No Known Allergies  Family History  Problem Relation Age of Onset  . Hodgkin's lymphoma Father      Prior to Admission medications   Medication Sig Start Date End Date Taking? Authorizing Provider  allopurinol (ZYLOPRIM) 300 MG tablet TAKE 1 TABLET(300 MG) BY MOUTH DAILY Patient taking differently: Take 300 mg by mouth daily.  05/30/18  Yes Orlena Sheldon, PA-C  alum & mag hydroxide-simeth [facility]) 200-200-20 MG/5ML suspension Take 30 mLs by mouth as needed for indigestion or heartburn.   Yes [provider]  atorvastatin (LIPITOR) 20 MG tablet Take 1 tablet (20 mg total) by mouth daily. 11/23/17  Yes McCausland, Modena Nunnery, MD  colchicine 0.6 MG tablet Take 1 tablet (0.6 mg total) by mouth 2 (two) times daily as needed (gout flare). Take twice daily for 2 weeks, then  use as needed for gout flare up 12/04/17  Yes Eugenie Filler, MD  furosemide (LASIX) 20 MG tablet TAKE 1 TABLET BY MOUTH DAILY Patient taking differently: Take 20 mg by mouth daily as needed for fluid.  01/23/18  Yes Kenneth, Modena Nunnery, MD  guaiFENesin (MUCINEX) 600 MG 12 hr tablet Take 600-1,200 mg by mouth every 12 (twelve) hours as needed for cough.    Yes [provider]  guaiFENesin (ROBITUSSIN) 100 MG/5ML liquid Take 200 mg by mouth every 6 (six) hours as needed for cough.    Yes [provider]  levETIRAcetam (KEPPRA) 750 MG tablet Take 1,500 mg by mouth daily. 02/17/19  Yes [provider]  lisinopril (PRINIVIL,ZESTRIL) 20 MG tablet TAKE 1 TABLET(20 MG) BY MOUTH DAILY Patient taking differently: Take 20 mg by mouth daily.  06/03/18  Yes Dena Billet B, PA-C  loperamide (IMODIUM A-D) 2 MG tablet Take 2 mg by mouth as needed for diarrhea or loose stools.    Yes [provider]  magnesium hydroxide (MILK OF MAGNESIA) 400 MG/5ML suspension Take 30 mLs by mouth at bedtime as needed for mild constipation.   Yes [provider]  metFORMIN (GLUCOPHAGE) 500 MG tablet Take 500 mg by mouth daily with breakfast.   Yes [provider]  metoprolol tartrate (LOPRESSOR) 50 MG tablet Take 1.5 tablets (75 mg total) by mouth 2 (two) times daily. 02/05/18 08/02/20 Yes Camnitz, Will Hassell Done, MD  naproxen sodium (ALEVE) 220 MG tablet Take 220 mg by mouth every 12 (twelve) hours as needed (joint pain).   Yes [provider]  neomycin-bacitracin-polymyxin (NEOSPORIN) ointment Apply 1 application topically as needed for wound care.   Yes [provider]  pantoprazole (PROTONIX) 40 MG tablet TAKE 1 TABLET BY MOUTH TWICE DAILY BEFORE A MEAL Patient taking differently: Take 40 mg by mouth 2 (two) times daily.  05/30/18  Yes Orlena Sheldon, PA-C  tamsulosin (FLOMAX) 0.4 MG CAPS capsule TAKE 1 CAPSULE(0.4 MG) BY MOUTH DAILY Patient taking differently: Take  0.4 mg by mouth daily.  05/30/18  Yes Dena Billet B, PA-C  traMADol (ULTRAM) 50 MG tablet Take 50 mg by mouth 2 (two) times daily. 0800 and 2000 03/19/20  Yes [provider]  vitamin B-12 (CYANOCOBALAMIN) 500 MCG tablet Take 500 mcg by mouth daily.   Yes [provider]  amLODipine (NORVASC) 5 MG tablet TAKE 1 TABLET(5 MG) BY MOUTH DAILY Patient not taking: No sig reported 05/30/18   Dena Billet B, PA-C  clotrimazole-betamethasone (LOTRISONE) cream APPLY EXTERNALLY TO THE AFFECTED AREA TWICE DAILY Patient not taking: No sig reported 03/22/17   Alycia Rossetti, MD  ferrous sulfate 325 (65 FE) MG tablet Take 1 tablet (325 mg total) by mouth daily. Patient not taking: Reported on 04/04/2019 12/04/17   Eugenie Filler, MD  folic acid (FOLVITE) 1 MG tablet Take 1 tablet (1 mg total) by mouth daily. Patient not taking: Reported on 04/04/2019 12/05/17   Eugenie Filler, MD  levofloxacin (LEVAQUIN) 500 MG tablet Take 1 tablet (500 mg total) by mouth daily. Patient not taking: Reported on 09/19/2019 04/04/19   Varney Biles, MD  lidocaine (XYLOCAINE) 2 % solution Patient: Mix 1part 2% viscous lidocaine, 1part H20. Swish & swallow 64m of diluted mixture, 218m before meals and at bedtime, up to QID Patient not taking: Reported on 04/04/2019 10/21/18   SqEppie GibsonMD  Multiple Vitamin (MULTIVITAMIN WITH MINERALS) TABS tablet Take 1 tablet by mouth daily. Patient not taking: Reported on 08/02/2020 12/05/17   ThEugenie FillerMD  neomycin-colistin-hydrocortisone-thonzonium (CORTISPORIN-TC) 3.01-06-09-0.5 MG/ML OTIC suspension Place 3 drops into the left ear 4 (four) times daily. Patient not taking: Reported on 04/04/2019 11/15/18   SqEppie GibsonMD  nystatin (MYCOSTATIN) 100000 UNIT/ML suspension Take 5 mLs (500,000 Units total) by mouth 4 (four) times daily. Swish and gargle in mouth for a minute, then swallow. This should help thrush in mouth. Use QID until bottle is empty. Patient not  taking: Reported on 08/02/2020 10/21/18   SqEppie GibsonMD  polyethylene glycol (MM[facility] GLFloria Ravelingpacket Take 17 g by mouth 2 (two) times daily. Patient not taking: Reported on 08/02/2020 12/04/17   ThEugenie FillerMD  potassium chloride (K-DUR) 10 MEQ tablet TAKE 1 TABLET(10 MEQ) BY MOUTH DAILY Patient not taking: No sig reported 05/30/18   DiOrlena SheldonPA-C  thiamine 100 MG tablet Take 1 tablet (100 mg total) by mouth daily. Patient not taking: Reported on 04/04/2019 12/05/17   ThEugenie FillerMD    Physical Exam: Vitals:   08/02/20 2311 08/02/20 2315 08/02/20 2330 08/03/20 0000  BP: (!) 104/54 (!) 104/54 (!) 104/56 (!Marland Kitchen  116/57  Pulse: 65 66 62 71  Resp: 11 12 13 15   Temp:      TempSrc:      SpO2: 100% 96% 96% 96%  Weight:      Height:        Constitutional: Acute alert and oriented x3, no associated distress.   Skin: no rashes, no lesions, notable poor skin turgor.   Eyes: Pupils are equally reactive to light.  No evidence of scleral icterus or conjunctival pallor.  ENMT: Moist mucous membranes noted.  Posterior pharynx clear of any exudate or lesions.   Neck: normal, supple, no masses, no thyromegaly.  No evidence of jugular venous distension.   Respiratory: clear to auscultation bilaterally, no wheezing, no crackles. Normal respiratory effort. No accessory muscle use.  Cardiovascular: Regular rate and rhythm, no murmurs / rubs / gallops. No extremity edema. 2+ pedal pulses. No carotid bruits.  Chest:   Nontender without crepitus or deformity.   Back:   Nontender without crepitus or deformity. Abdomen: Abdomen is soft and nontender.  No evidence of intra-abdominal masses.  Positive bowel sounds noted in all quadrants.   Musculoskeletal: No joint deformity upper and lower extremities. Good ROM, no contractures. Normal muscle tone.  Neurologic: Likely left lateral visual field deficit noted.  Patient is unfortunately not cooperating with the visual field examination  consistently and these findings may be inaccurate.  Otherwise, CN 2-12 grossly intact. Sensation intact.  Patient moving all 4 extremities spontaneously.  Patient is following all commands.  Patient is responsive to verbal stimuli.   Psychiatric: Patient exhibits normal mood with appropriate affect.  Patient seems to possess insight as to their current situation.     Labs on Admission: I have personally reviewed following labs and imaging studies -   CBC: Recent Labs  Lab 08/02/20 1930  WBC 8.3  NEUTROABS 6.1  HGB 10.4*  HCT 30.5*  MCV 88.9  PLT 094   Basic Metabolic Panel: Recent Labs  Lab 08/02/20 1930  NA 131*  K 3.9  CL 97*  CO2 26  GLUCOSE 105*  BUN 26*  CREATININE 1.61*  CALCIUM 9.2   GFR: Estimated Creatinine Clearance: 44 mL/min (A) (by C-G formula based on SCr of 1.61 mg/dL (H)). Liver Function Tests: Recent Labs  Lab 08/02/20 1930  AST 21  ALT 14  ALKPHOS 70  BILITOT 0.2*  PROT 7.2  ALBUMIN 4.0   No results for input(s): LIPASE, AMYLASE in the last 168 hours. No results for input(s): AMMONIA in the last 168 hours. Coagulation Profile: No results for input(s): INR, PROTIME in the last 168 hours. Cardiac Enzymes: No results for input(s): CKTOTAL, CKMB, CKMBINDEX, TROPONINI in the last 168 hours. BNP (last 3 results) No results for input(s): PROBNP in the last 8760 hours. HbA1C: No results for input(s): HGBA1C in the last 72 hours. CBG: No results for input(s): GLUCAP in the last 168 hours. Lipid Profile: No results for input(s): CHOL, HDL, LDLCALC, TRIG, CHOLHDL, LDLDIRECT in the last 72 hours. Thyroid Function Tests: No results for input(s): TSH, T4TOTAL, FREET4, T3FREE, THYROIDAB in the last 72 hours. Anemia Panel: No results for input(s): VITAMINB12, FOLATE, FERRITIN, TIBC, IRON, RETICCTPCT in the last 72 hours. Urine analysis:    Component Value Date/Time   COLORURINE YELLOW 08/02/2020 2208   APPEARANCEUR CLEAR 08/02/2020 2208   LABSPEC  1.009 08/02/2020 2208   PHURINE 5.0 08/02/2020 2208   GLUCOSEU NEGATIVE 08/02/2020 2208   HGBUR NEGATIVE 08/02/2020 2208   BILIRUBINUR NEGATIVE 08/02/2020  [place] 08/02/2020 2208   PROTEINUR NEGATIVE 08/02/2020 2208   UROBILINOGEN 0.2 08/13/2014 1010   NITRITE NEGATIVE 08/02/2020 2208   LEUKOCYTESUR NEGATIVE 08/02/2020 2208    Radiological Exams on Admission - Personally Reviewed: CT Head Wo Contrast  Result Date: 08/02/2020 CLINICAL DATA:  Seizure EXAM: CT HEAD WITHOUT CONTRAST CT CERVICAL SPINE WITHOUT CONTRAST TECHNIQUE: Multidetector CT imaging of the head and cervical spine was performed following the standard protocol without intravenous contrast. Multiplanar CT image reconstructions of the cervical spine were also generated. COMPARISON:  CT head 12/30/2018 FINDINGS: CT HEAD FINDINGS Brain: Interval development of large mass in the right occipital lobe. This is mildly hyperdense to cortex. This appears infiltrative and abuts the falx. The mass appears to cross the midline posterior to the splenium. There is edema in the splenium as well as in the white matter surrounding the mass. The mass measures approximately 6 x 4 cm. There is local mass-effect and mild midline shift to the left. No hydrocephalus. Negative for acute hemorrhage.  No acute infarct. Vascular: Negative for hyperdense vessel Skull: Negative Sinuses/Orbits: Negative Other: None CT CERVICAL SPINE FINDINGS Alignment: 1 mm anterolisthesis C3-4. Skull base and vertebrae: Negative for fracture or mass. Soft tissues and spinal canal: Negative for mass or adenopathy. Atherosclerotic calcification in the carotid artery bilaterally. Probable significant stenosis in the carotid artery bilaterally. Disc levels: Multilevel disc degeneration and facet degeneration throughout the cervical spine with spurring and foraminal stenosis bilaterally due to spurring. Upper chest: Lung apices clear bilaterally. Other: None IMPRESSION:  1. 4 x 6 mm mass in the right occipital lobe with surrounding edema. The mass is mildly hyper dense to cortex. This is most likely a neoplasm. Favor glioblastoma. Differential diagnosis includes metastatic disease, lymphoma, meningioma. Recommend MRI brain without with contrast. 2. Negative for cervical spine fracture 3. Extensive atherosclerotic calcification of the carotid artery bilaterally, likely causing significant stenosis especially on the right. Electronically Signed   By: Franchot Gallo M.D.   On: 08/02/2020 20:43   CT Cervical Spine Wo Contrast  Result Date: 08/02/2020 CLINICAL DATA:  Seizure EXAM: CT HEAD WITHOUT CONTRAST CT CERVICAL SPINE WITHOUT CONTRAST TECHNIQUE: Multidetector CT imaging of the head and cervical spine was performed following the standard protocol without intravenous contrast. Multiplanar CT image reconstructions of the cervical spine were also generated. COMPARISON:  CT head 12/30/2018 FINDINGS: CT HEAD FINDINGS Brain: Interval development of large mass in the right occipital lobe. This is mildly hyperdense to cortex. This appears infiltrative and abuts the falx. The mass appears to cross the midline posterior to the splenium. There is edema in the splenium as well as in the white matter surrounding the mass. The mass measures approximately 6 x 4 cm. There is local mass-effect and mild midline shift to the left. No hydrocephalus. Negative for acute hemorrhage.  No acute infarct. Vascular: Negative for hyperdense vessel Skull: Negative Sinuses/Orbits: Negative Other: None CT CERVICAL SPINE FINDINGS Alignment: 1 mm anterolisthesis C3-4. Skull base and vertebrae: Negative for fracture or mass. Soft tissues and spinal canal: Negative for mass or adenopathy. Atherosclerotic calcification in the carotid artery bilaterally. Probable significant stenosis in the carotid artery bilaterally. Disc levels: Multilevel disc degeneration and facet degeneration throughout the cervical spine with  spurring and foraminal stenosis bilaterally due to spurring. Upper chest: Lung apices clear bilaterally. Other: None IMPRESSION: 1. 4 x 6 mm mass in the right occipital lobe with surrounding edema. The mass is mildly hyper dense to cortex. This is  most likely a neoplasm. Favor glioblastoma. Differential diagnosis includes metastatic disease, lymphoma, meningioma. Recommend MRI brain without with contrast. 2. Negative for cervical spine fracture 3. Extensive atherosclerotic calcification of the carotid artery bilaterally, likely causing significant stenosis especially on the right. Electronically Signed   By: Franchot Gallo M.D.   On: 08/02/2020 20:43    EKG: Personally reviewed.  Ventricularly paced with heart rate of 60 bpm.    Assessment/Plan Principal Problem:   Seizure with provoking factor [facility])   Patient presenting with 3 witnessed seizures at his facility that he does not recall, each followed by a postictal state  Patient explains that he has not a seizure in approximately 8 years prior to this.  CT imaging of the brain reveals a large 6 x 4 cm lesion in the right occipital region which is likely the cause of the new seizures.   Evidence of edema and midline shift on CT imaging as well.  Emergency department staff discussed these findings with NP Meyran with neurosurgery who recommended initiation of intravenous Decadron and transferring the [facility] to [facility] for neurosurgical consultation.  She also recommended obtaining an MRI if possible.   Emergency department provider then discussed case with Dr. Jonny Ruiz with neurology who recommended initiation of 1000 mg of Keppra x1 followed by increasing patient's home regimen of Keppra to 1000 mg twice daily starting tomorrow (9/28).   Neurology and neurosurgery will likely need to be notified once patient arrives at [facility].  Seizure precautions  Monitoring for further seizure activity  Keeping patient n.p.o. for now,  will advance diet as patient remains seizure-free   Active Problems:   Neoplasm of brain causing mass effect on adjacent structures [facility])   CT imaging of the brain reveals a large 6 x 4 cm lesion in the right occipital region which is likely the cause of the new seizures.   Evidence of edema and midline shift on CT imaging as well.  Patient has been given 10 mg of IV Decadron by the emergency department staff at the direction of neurosurgery.  I will follow this up by additionally providing patient with 4 mg of intravenous Decadron every 6 hours unless otherwise instructed by neurosurgery  Antiepileptic drug modification as mentioned above.  While neurosurgery has recommended MRI of the brain, patient possesses a pacemaker due to history of complete heart block, Medtronic Adapta [id number] which may not be MRI safe -communication with MRI staff will need to be made upon arrival to [facility] and alternatively CT of head with contrast can be performed if necessary.    Celiac disease   Longstanding history, no active symptoms    Essential hypertension   Continue home regimen of antihypertensive therapy    Chronic diastolic CHF (congestive heart failure) (HCC)   Longstanding history of diastolic congestive heart failure with hypertrophic obstructive cardiomyopathy   Although review of echocardiogram from 2016 makes no mention of hypertrophic obstructive cardiomyopathy  No evidence of cardiogenic volume overload at this time    Seizure disorder [facility])   Please see assessment and plan above    Type 2 diabetes mellitus without complication, with long-term current use of insulin (HCC)   Accu-Cheks 6 hours while n.p.o. with sliding scale insulin  Hemoglobin A1c pending  Holding home regimen of Metformin    History of alcohol abuse   Patient remains being abstinent of alcohol for greater than 1 year  Mixed hyperlipidemia due to type 2 diabetes mellitus ([place])   Continue  home regimen of statin therapy    Bilateral carotid artery stenosis  Incidentally mentioned on CT imaging of head and neck today  Apparently, patient has been diagnosed with bilateral carotid artery stenosis since 2019  Carotid stenosis is unlikely to be contributing to patient's current presentation, outpatient follow-up.   Code Status:  DNR Family Communication: deferred   Status is: Observation  The patient remains OBS appropriate and will d/c before 2 midnights.  Dispo: The patient is from: ALF              Anticipated d/c is to: ALF              Anticipated d/c date is: 2 days              Patient currently is not medically stable to d/c.        Vernelle Emerald MD [facility] Pager [phone]  If 7PM-7AM, please contact night-coverage [email or web address] Use universal [place] password for that web site. If you do not have the password, please call the hospital operator.  08/03/2020, 12:44 AM

## 2020-08-04 ENCOUNTER — Encounter (HOSPITAL_COMMUNITY)
Admission: EM | Disposition: A | Discharge status: Skilled Nursing Facility | Payer: Self-pay | Source: Home / Self Care | Attending: Internal Medicine

## 2020-08-04 ENCOUNTER — Inpatient Hospital Stay (HOSPITAL_COMMUNITY): Payer: Medicare HMO | Admitting: Anesthesiology

## 2020-08-04 ENCOUNTER — Encounter (HOSPITAL_COMMUNITY): Payer: Self-pay | Admitting: Internal Medicine

## 2020-08-04 ENCOUNTER — Other Ambulatory Visit: Payer: Self-pay | Admitting: General Surgery

## 2020-08-04 ENCOUNTER — Inpatient Hospital Stay (HOSPITAL_COMMUNITY): Payer: Medicare HMO

## 2020-08-04 DIAGNOSIS — I10 Essential (primary) hypertension: Secondary | ICD-10-CM | POA: Diagnosis not present

## 2020-08-04 DIAGNOSIS — K900 Celiac disease: Secondary | ICD-10-CM | POA: Diagnosis not present

## 2020-08-04 DIAGNOSIS — D496 Neoplasm of unspecified behavior of brain: Secondary | ICD-10-CM | POA: Diagnosis not present

## 2020-08-04 DIAGNOSIS — I5032 Chronic diastolic (congestive) heart failure: Secondary | ICD-10-CM | POA: Diagnosis not present

## 2020-08-04 HISTORY — PX: APPLICATION OF CRANIAL NAVIGATION: SHX6578

## 2020-08-04 HISTORY — PX: CRANIOTOMY: SHX93

## 2020-08-04 LAB — POCT I-STAT 7, (LYTES, BLD GAS, ICA,H+H)
Acid-Base Excess: 0 mmol/L (ref 0.0–2.0)
Acid-Base Excess: 1 mmol/L (ref 0.0–2.0)
Bicarbonate: 24.7 mmol/L (ref 20.0–28.0)
Bicarbonate: 24.1 mmol/L (ref 20.0–28.0)
Calcium, Ion: 1.2 mmol/L (ref 1.15–1.40)
Calcium, Ion: 1.22 mmol/L (ref 1.15–1.40)
HCT: 30 % — ABNORMAL LOW (ref 39.0–52.0)
HCT: 30 % — ABNORMAL LOW (ref 39.0–52.0)
Hemoglobin: 10.2 g/dL — ABNORMAL LOW (ref 13.0–17.0)
Hemoglobin: 10.2 g/dL — ABNORMAL LOW (ref 13.0–17.0)
O2 Saturation: 100 %
O2 Saturation: 100 %
Potassium: 3.9 mmol/L (ref 3.5–5.1)
Potassium: 4.2 mmol/L (ref 3.5–5.1)
Sodium: 135 mmol/L (ref 135–145)
Sodium: 135 mmol/L (ref 135–145)
TCO2: 26 mmol/L (ref 22–32)
TCO2: 25 mmol/L (ref 22–32)
pCO2 arterial: 37.9 mmHg (ref 32.0–48.0)
pCO2 arterial: 33.7 mmHg (ref 32.0–48.0)
pH, Arterial: 7.423 (ref 7.350–7.450)
pH, Arterial: 7.462 — ABNORMAL HIGH (ref 7.350–7.450)
pO2, Arterial: 314 mmHg — ABNORMAL HIGH (ref 83.0–108.0)
pO2, Arterial: 211 mmHg — ABNORMAL HIGH (ref 83.0–108.0)

## 2020-08-04 LAB — CBC
HCT: 30.8 % — ABNORMAL LOW (ref 39.0–52.0)
Hemoglobin: 10.6 g/dL — ABNORMAL LOW (ref 13.0–17.0)
MCH: 29.9 pg (ref 26.0–34.0)
MCHC: 34.4 g/dL (ref 30.0–36.0)
MCV: 87 fL (ref 80.0–100.0)
Platelets: 249 10*3/uL (ref 150–400)
RBC: 3.54 MIL/uL — ABNORMAL LOW (ref 4.22–5.81)
RDW: 14.6 % (ref 11.5–15.5)
WBC: 14.8 10*3/uL — ABNORMAL HIGH (ref 4.0–10.5)
nRBC: 0 % (ref 0.0–0.2)

## 2020-08-04 LAB — BASIC METABOLIC PANEL
Anion gap: 11 (ref 5–15)
BUN: 22 mg/dL (ref 8–23)
CO2: 23 mmol/L (ref 22–32)
Calcium: 9.6 mg/dL (ref 8.9–10.3)
Chloride: 103 mmol/L (ref 98–111)
Creatinine, Ser: 1.22 mg/dL (ref 0.61–1.24)
GFR calc Af Amer: >60 mL/min (ref >60)
GFR calc non Af Amer: 58 mL/min — ABNORMAL LOW (ref >60)
Glucose, Bld: 126 mg/dL — ABNORMAL HIGH (ref 70–99)
Potassium: 4.1 mmol/L (ref 3.5–5.1)
Sodium: 137 mmol/L (ref 135–145)

## 2020-08-04 LAB — PREPARE RBC (CROSSMATCH)

## 2020-08-04 LAB — GLUCOSE, CAPILLARY
Glucose-Capillary: 139 mg/dL — ABNORMAL HIGH (ref 70–99)
Glucose-Capillary: 141 mg/dL — ABNORMAL HIGH (ref 70–99)
Glucose-Capillary: 134 mg/dL — ABNORMAL HIGH (ref 70–99)

## 2020-08-04 LAB — MAGNESIUM: Magnesium: 1.5 mg/dL — ABNORMAL LOW (ref 1.7–2.4)

## 2020-08-04 LAB — SURGICAL PCR SCREEN
MRSA, PCR: NEGATIVE
Staphylococcus aureus: NEGATIVE

## 2020-08-04 SURGERY — CRANIOTOMY TUMOR EXCISION
Anesthesia: General | Site: Head | Laterality: Right

## 2020-08-04 MED ORDER — MORPHINE SULFATE (PF) 2 MG/ML IV SOLN
1.0000 mg | INTRAVENOUS | Status: DC | PRN
  Administered 2020-08-07: 2 mg via INTRAVENOUS
  Filled 2020-08-04: qty 1

## 2020-08-04 MED ORDER — LIDOCAINE-EPINEPHRINE 1 %-1:100000 IJ SOLN
INTRAMUSCULAR | Status: DC | PRN
  Administered 2020-08-04: 9 mL

## 2020-08-04 MED ORDER — ENALAPRILAT 1.25 MG/ML IV SOLN
1.2500 mg | Freq: Four times a day (QID) | INTRAVENOUS | Status: DC | PRN
  Administered 2020-08-05: 1.25 mg via INTRAVENOUS
  Filled 2020-08-04: qty 1

## 2020-08-04 MED ORDER — LIDOCAINE 2% (20 MG/ML) 5 ML SYRINGE
INTRAMUSCULAR | Status: DC | PRN
  Administered 2020-08-04: 60 mg via INTRAVENOUS

## 2020-08-04 MED ORDER — ROCURONIUM BROMIDE 10 MG/ML (PF) SYRINGE
PREFILLED_SYRINGE | INTRAVENOUS | Status: AC
  Filled 2020-08-04: qty 10

## 2020-08-04 MED ORDER — DOCUSATE SODIUM 100 MG PO CAPS
100.0000 mg | ORAL_CAPSULE | Freq: Two times a day (BID) | ORAL | Status: DC
  Administered 2020-08-04 – 2020-08-16 (×22): 100 mg via ORAL
  Filled 2020-08-04 (×23): qty 1

## 2020-08-04 MED ORDER — ONDANSETRON HCL 4 MG/2ML IJ SOLN: 4.0000 mg | Freq: Once | INTRAMUSCULAR | Status: DC | PRN

## 2020-08-04 MED ORDER — NICARDIPINE HCL IN NACL 20-0.86 MG/200ML-% IV SOLN
3.0000 mg/h | INTRAVENOUS | Status: DC
  Administered 2020-08-04: 5 mg/h via INTRAVENOUS
  Filled 2020-08-04: qty 200

## 2020-08-04 MED ORDER — THROMBIN 20000 UNITS EX SOLR
CUTANEOUS | Status: AC
  Filled 2020-08-04: qty 20000

## 2020-08-04 MED ORDER — THROMBIN 5000 UNITS EX SOLR
CUTANEOUS | Status: AC
  Filled 2020-08-04: qty 5000

## 2020-08-04 MED ORDER — SODIUM CHLORIDE 0.9 % IV SOLN
INTRAVENOUS | Status: DC | PRN
  Administered 2020-08-04: 1000 mg via INTRAVENOUS

## 2020-08-04 MED ORDER — SODIUM CHLORIDE 0.9 % IR SOLN
Status: DC | PRN
  Administered 2020-08-04: 1000 mL

## 2020-08-04 MED ORDER — ONDANSETRON HCL 4 MG/2ML IJ SOLN: 4.0000 mg | INTRAMUSCULAR | Status: DC | PRN

## 2020-08-04 MED ORDER — SODIUM CHLORIDE 0.9 % IV SOLN: INTRAVENOUS | Status: DC

## 2020-08-04 MED ORDER — PROPOFOL 10 MG/ML IV BOLUS
INTRAVENOUS | Status: AC
  Filled 2020-08-04: qty 20

## 2020-08-04 MED ORDER — BACITRACIN ZINC 500 UNIT/GM EX OINT
TOPICAL_OINTMENT | CUTANEOUS | Status: AC
  Filled 2020-08-04: qty 28.35

## 2020-08-04 MED ORDER — THROMBIN 20000 UNITS EX SOLR
CUTANEOUS | Status: DC | PRN
  Administered 2020-08-04: 20 mL via TOPICAL

## 2020-08-04 MED ORDER — EPHEDRINE 5 MG/ML INJ
INTRAVENOUS | Status: AC
  Filled 2020-08-04: qty 10

## 2020-08-04 MED ORDER — LIDOCAINE-EPINEPHRINE 1 %-1:100000 IJ SOLN
INTRAMUSCULAR | Status: AC
  Filled 2020-08-04: qty 1

## 2020-08-04 MED ORDER — DEXAMETHASONE SODIUM PHOSPHATE 10 MG/ML IJ SOLN
INTRAMUSCULAR | Status: DC | PRN
  Administered 2020-08-04: 10 mg via INTRAVENOUS

## 2020-08-04 MED ORDER — 0.9 % SODIUM CHLORIDE (POUR BTL) OPTIME
TOPICAL | Status: DC | PRN
  Administered 2020-08-04 (×4): 1000 mL

## 2020-08-04 MED ORDER — CHLORHEXIDINE GLUCONATE CLOTH 2 % EX PADS
6.0000 | MEDICATED_PAD | Freq: Every day | CUTANEOUS | Status: DC
  Administered 2020-08-04 – 2020-08-13 (×8): 6 via TOPICAL

## 2020-08-04 MED ORDER — ENOXAPARIN SODIUM 40 MG/0.4ML ~~LOC~~ SOLN
40.0000 mg | SUBCUTANEOUS | Status: DC
  Administered 2020-08-05 – 2020-08-16 (×12): 40 mg via SUBCUTANEOUS
  Filled 2020-08-04 (×12): qty 0.4

## 2020-08-04 MED ORDER — DEXAMETHASONE SODIUM PHOSPHATE 10 MG/ML IJ SOLN
6.0000 mg | Freq: Four times a day (QID) | INTRAMUSCULAR | Status: DC
  Administered 2020-08-04 – 2020-08-05 (×3): 6 mg via INTRAVENOUS
  Filled 2020-08-04 (×3): qty 1

## 2020-08-04 MED ORDER — POLYETHYLENE GLYCOL 3350 17 G PO PACK: 17.0000 g | PACK | Freq: Every day | ORAL | Status: DC | PRN

## 2020-08-04 MED ORDER — PROMETHAZINE HCL 25 MG PO TABS
12.5000 mg | ORAL_TABLET | ORAL | Status: DC | PRN
  Administered 2020-08-11: 25 mg via ORAL
  Filled 2020-08-04: qty 1

## 2020-08-04 MED ORDER — FENTANYL CITRATE (PF) 250 MCG/5ML IJ SOLN
INTRAMUSCULAR | Status: DC | PRN
Start: 2020-08-04 — End: 2020-08-04
  Administered 2020-08-04: 50 ug via INTRAVENOUS
  Administered 2020-08-04 (×2): 100 ug via INTRAVENOUS

## 2020-08-04 MED ORDER — PHENYLEPHRINE HCL-NACL 10-0.9 MG/250ML-% IV SOLN
INTRAVENOUS | Status: DC | PRN
  Administered 2020-08-04: 50 ug/min via INTRAVENOUS

## 2020-08-04 MED ORDER — FENTANYL CITRATE (PF) 250 MCG/5ML IJ SOLN
INTRAMUSCULAR | Status: AC
  Filled 2020-08-04: qty 5

## 2020-08-04 MED ORDER — CEFAZOLIN SODIUM-DEXTROSE 1-4 GM/50ML-% IV SOLN
1.0000 g | Freq: Three times a day (TID) | INTRAVENOUS | Status: AC
  Administered 2020-08-04 – 2020-08-05 (×3): 1 g via INTRAVENOUS
  Filled 2020-08-04 (×3): qty 50

## 2020-08-04 MED ORDER — ONDANSETRON HCL 4 MG PO TABS: 4.0000 mg | ORAL_TABLET | ORAL | Status: DC | PRN

## 2020-08-04 MED ORDER — CHLORHEXIDINE GLUCONATE 0.12 % MT SOLN
15.0000 mL | Freq: Once | OROMUCOSAL | Status: AC
  Administered 2020-08-04: 15 mL via OROMUCOSAL

## 2020-08-04 MED ORDER — DEXAMETHASONE SODIUM PHOSPHATE 4 MG/ML IJ SOLN: 4.0000 mg | Freq: Four times a day (QID) | INTRAMUSCULAR | Status: DC

## 2020-08-04 MED ORDER — EPHEDRINE SULFATE 50 MG/ML IJ SOLN
INTRAMUSCULAR | Status: DC | PRN
  Administered 2020-08-04: 5 mg via INTRAVENOUS

## 2020-08-04 MED ORDER — HYDROCODONE-ACETAMINOPHEN 5-325 MG PO TABS
1.0000 | ORAL_TABLET | ORAL | Status: DC | PRN
  Administered 2020-08-07 – 2020-08-11 (×5): 1 via ORAL
  Filled 2020-08-04 (×6): qty 1

## 2020-08-04 MED ORDER — SODIUM CHLORIDE 0.9 % IV SOLN
0.0500 ug/kg/min | INTRAVENOUS | Status: AC
  Administered 2020-08-04: .1 ug/kg/min via INTRAVENOUS
  Filled 2020-08-04: qty 5000

## 2020-08-04 MED ORDER — LABETALOL HCL 5 MG/ML IV SOLN
10.0000 mg | INTRAVENOUS | Status: DC | PRN
  Administered 2020-08-05 (×2): 10 mg via INTRAVENOUS
  Filled 2020-08-04 (×2): qty 4

## 2020-08-04 MED ORDER — HEMOSTATIC AGENTS (NO CHARGE) OPTIME
TOPICAL | Status: DC | PRN
  Administered 2020-08-04 (×2): 1 via TOPICAL

## 2020-08-04 MED ORDER — CHLORHEXIDINE GLUCONATE 0.12 % MT SOLN
OROMUCOSAL | Status: AC
  Filled 2020-08-04: qty 15

## 2020-08-04 MED ORDER — MANNITOL 25 % IV SOLN
INTRAVENOUS | Status: DC | PRN
  Administered 2020-08-04: 43.5 g via INTRAVENOUS

## 2020-08-04 MED ORDER — FENTANYL CITRATE (PF) 100 MCG/2ML IJ SOLN: 25.0000 ug | INTRAMUSCULAR | Status: DC | PRN

## 2020-08-04 MED ORDER — THROMBIN 5000 UNITS EX SOLR
OROMUCOSAL | Status: DC | PRN
  Administered 2020-08-04 (×2): 5 mL via TOPICAL

## 2020-08-04 MED ORDER — FLEET ENEMA 7-19 GM/118ML RE ENEM
1.0000 | ENEMA | Freq: Once | RECTAL | Status: AC | PRN
  Administered 2020-08-14: 1 via RECTAL
  Filled 2020-08-04 (×2): qty 1

## 2020-08-04 MED ORDER — PROPOFOL 10 MG/ML IV BOLUS
INTRAVENOUS | Status: DC | PRN
  Administered 2020-08-04 (×3): 50 mg via INTRAVENOUS
  Administered 2020-08-04: 150 mg via INTRAVENOUS

## 2020-08-04 MED ORDER — ENOXAPARIN SODIUM 30 MG/0.3ML ~~LOC~~ SOLN: 30.0000 mg | SUBCUTANEOUS | Status: DC

## 2020-08-04 MED ORDER — BACITRACIN ZINC 500 UNIT/GM EX OINT
TOPICAL_OINTMENT | CUTANEOUS | Status: DC | PRN
  Administered 2020-08-04: 1 via TOPICAL

## 2020-08-04 MED ORDER — CEFAZOLIN SODIUM-DEXTROSE 2-4 GM/100ML-% IV SOLN
2.0000 g | INTRAVENOUS | Status: AC
  Administered 2020-08-04: 2 g via INTRAVENOUS
  Filled 2020-08-04: qty 100

## 2020-08-04 MED ORDER — ROCURONIUM BROMIDE 10 MG/ML (PF) SYRINGE
PREFILLED_SYRINGE | INTRAVENOUS | Status: DC | PRN
  Administered 2020-08-04: 40 mg via INTRAVENOUS
  Administered 2020-08-04: 60 mg via INTRAVENOUS
  Administered 2020-08-04: 30 mg via INTRAVENOUS

## 2020-08-04 MED ORDER — LIDOCAINE 2% (20 MG/ML) 5 ML SYRINGE
INTRAMUSCULAR | Status: AC
  Filled 2020-08-04: qty 5

## 2020-08-04 MED ORDER — ALBUMIN HUMAN 5 % IV SOLN: INTRAVENOUS | Status: DC | PRN

## 2020-08-04 MED ORDER — DEXAMETHASONE SODIUM PHOSPHATE 4 MG/ML IJ SOLN: 4.0000 mg | Freq: Three times a day (TID) | INTRAMUSCULAR | Status: DC

## 2020-08-04 SURGICAL SUPPLY — 103 items
APL SKNCLS STERI-STRIP NONHPOA (GAUZE/BANDAGES/DRESSINGS)
BAND INSRT 18 STRL LF DISP RB (MISCELLANEOUS) ×4
BAND RUBBER #18 3X1/16 STRL (MISCELLANEOUS) ×6 IMPLANT
BENZOIN TINCTURE PRP APPL 2/3 (GAUZE/BANDAGES/DRESSINGS) IMPLANT
BLADE CLIPPER SURG (BLADE) ×4 IMPLANT
BLADE SAW GIGLI 16 STRL (MISCELLANEOUS) IMPLANT
BLADE SURG 11 STRL SS (BLADE) ×4 IMPLANT
BLADE SURG 15 STRL LF DISP TIS (BLADE) IMPLANT
BLADE SURG 15 STRL SS (BLADE)
BNDG CMPR 75X41 PLY HI ABS (GAUZE/BANDAGES/DRESSINGS)
BNDG GAUZE ELAST 4 BULKY (GAUZE/BANDAGES/DRESSINGS) IMPLANT
BNDG STRETCH 4X75 STRL LF (GAUZE/BANDAGES/DRESSINGS) IMPLANT
BUR ACORN 9.0 PRECISION (BURR) ×3 IMPLANT
BUR ACORN 9.0MM PRECISION (BURR) ×1
BUR ROUND FLUTED 4 SOFT TCH (BURR) IMPLANT
BUR ROUND FLUTED 4MM SOFT TCH (BURR)
BUR SPIRAL ROUTER 2.3 (BUR) ×3 IMPLANT
BUR SPIRAL ROUTER 2.3MM (BUR) ×1
CANISTER SUCT 3000ML PPV (MISCELLANEOUS) ×8 IMPLANT
CATH VENTRIC 35X38 W/TROCAR LG (CATHETERS) IMPLANT
CLIP VESOCCLUDE MED 6/CT (CLIP) IMPLANT
CNTNR URN SCR LID CUP LEK RST (MISCELLANEOUS) ×2 IMPLANT
CONT SPEC 4OZ STRL OR WHT (MISCELLANEOUS) ×4
COVER BURR HOLE UNIV 10 (Orthopedic Implant) ×12 IMPLANT
COVER MAYO STAND STRL (DRAPES) IMPLANT
COVER WAND RF STERILE (DRAPES) ×4 IMPLANT
DECANTER SPIKE VIAL GLASS SM (MISCELLANEOUS) ×4 IMPLANT
DRAIN SUBARACHNOID (WOUND CARE) IMPLANT
DRAPE HALF SHEET 40X57 (DRAPES) ×4 IMPLANT
DRAPE MICROSCOPE LEICA (MISCELLANEOUS) IMPLANT
DRAPE NEUROLOGICAL W/INCISE (DRAPES) ×4 IMPLANT
DRAPE STERI IOBAN 125X83 (DRAPES) IMPLANT
DRAPE SURG 17X23 STRL (DRAPES) IMPLANT
DRAPE WARM FLUID 44X44 (DRAPES) ×4 IMPLANT
DRSG ADAPTIC 3X8 NADH LF (GAUZE/BANDAGES/DRESSINGS) IMPLANT
DRSG MEPILEX BORDER 4X8 (GAUZE/BANDAGES/DRESSINGS) ×3 IMPLANT
DRSG TELFA 3X8 NADH (GAUZE/BANDAGES/DRESSINGS) ×4 IMPLANT
DURAPREP 6ML APPLICATOR 50/CS (WOUND CARE) ×4 IMPLANT
ELECT COATED BLADE 2.86 ST (ELECTRODE) ×4 IMPLANT
ELECT REM PT RETURN 9FT ADLT (ELECTROSURGICAL) ×4
ELECTRODE REM PT RTRN 9FT ADLT (ELECTROSURGICAL) ×2 IMPLANT
EVACUATOR 1/8 PVC DRAIN (DRAIN) IMPLANT
EVACUATOR SILICONE 100CC (DRAIN) IMPLANT
FORCEPS BIPO MALIS IRRIG 9X1.5 (NEUROSURGERY SUPPLIES) ×4 IMPLANT
GAUZE 4X4 16PLY RFD (DISPOSABLE) IMPLANT
GAUZE SPONGE 4X4 12PLY STRL (GAUZE/BANDAGES/DRESSINGS) IMPLANT
GLOVE BIO SURGEON STRL SZ7.5 (GLOVE) ×4 IMPLANT
GLOVE BIOGEL PI IND STRL 7.0 (GLOVE) ×1 IMPLANT
GLOVE BIOGEL PI IND STRL 7.5 (GLOVE) ×2 IMPLANT
GLOVE BIOGEL PI INDICATOR 7.0 (GLOVE) ×2
GLOVE BIOGEL PI INDICATOR 7.5 (GLOVE) ×4
GLOVE ECLIPSE 6.5 STRL STRAW (GLOVE) ×3 IMPLANT
GLOVE ECLIPSE 7.5 STRL STRAW (GLOVE) ×15 IMPLANT
GLOVE EXAM NITRILE LRG STRL (GLOVE) IMPLANT
GLOVE EXAM NITRILE XL STR (GLOVE) IMPLANT
GLOVE SURG SS PI 7.0 STRL IVOR (GLOVE) ×6 IMPLANT
GOWN STRL REUS W/ TWL LRG LVL3 (GOWN DISPOSABLE) ×4 IMPLANT
GOWN STRL REUS W/ TWL XL LVL3 (GOWN DISPOSABLE) IMPLANT
GOWN STRL REUS W/TWL 2XL LVL3 (GOWN DISPOSABLE) ×6 IMPLANT
GOWN STRL REUS W/TWL LRG LVL3 (GOWN DISPOSABLE) ×8
GOWN STRL REUS W/TWL XL LVL3 (GOWN DISPOSABLE) ×12
HEMOSTAT POWDER KIT SURGIFOAM (HEMOSTASIS) ×7 IMPLANT
HEMOSTAT SURGICEL 2X14 (HEMOSTASIS) ×4 IMPLANT
HOOK DURA 1/2IN (MISCELLANEOUS) ×4 IMPLANT
IV NS 1000ML (IV SOLUTION) ×4
IV NS 1000ML BAXH (IV SOLUTION) ×2 IMPLANT
KIT BASIN OR (CUSTOM PROCEDURE TRAY) ×4 IMPLANT
KIT DRAIN CSF ACCUDRAIN (MISCELLANEOUS) IMPLANT
KIT TURNOVER KIT B (KITS) ×4 IMPLANT
MARKER SPHERE PSV REFLC 13MM (MARKER) ×12 IMPLANT
NDL SPNL 18GX3.5 QUINCKE PK (NEEDLE) IMPLANT
NEEDLE HYPO 22GX1.5 SAFETY (NEEDLE) ×4 IMPLANT
NEEDLE SPNL 18GX3.5 QUINCKE PK (NEEDLE) IMPLANT
NS IRRIG 1000ML POUR BTL (IV SOLUTION) ×14 IMPLANT
PACK BATTERY CMF DISP FOR DVR (ORTHOPEDIC DISPOSABLE SUPPLIES) ×2 IMPLANT
PACK CRANIOTOMY CUSTOM (CUSTOM PROCEDURE TRAY) ×4 IMPLANT
PAD DRESSING TELFA 3X8 NADH (GAUZE/BANDAGES/DRESSINGS) IMPLANT
PATTIES SURGICAL .25X.25 (GAUZE/BANDAGES/DRESSINGS) IMPLANT
PATTIES SURGICAL .5 X.5 (GAUZE/BANDAGES/DRESSINGS) IMPLANT
PATTIES SURGICAL .5 X3 (DISPOSABLE) ×8 IMPLANT
PATTIES SURGICAL 1/4 X 3 (GAUZE/BANDAGES/DRESSINGS) IMPLANT
PATTIES SURGICAL 1X1 (DISPOSABLE) IMPLANT
PIN MAYFIELD SKULL DISP (PIN) ×4 IMPLANT
SCREW UNIII AXS SD 1.5X4 (Screw) ×45 IMPLANT
SEALANT ADHERUS EXTEND TIP (MISCELLANEOUS) ×2 IMPLANT
SET TUBING IRRIGATION DISP (TUBING) ×7 IMPLANT
SLEEVE SURGEON STRL (DRAPES) ×3 IMPLANT
SPONGE NEURO XRAY DETECT 1X3 (DISPOSABLE) IMPLANT
SPONGE SURGIFOAM ABS GEL 100 (HEMOSTASIS) ×4 IMPLANT
STAPLER VISISTAT 35W (STAPLE) ×6 IMPLANT
SUT ETHILON 3 0 FSL (SUTURE) IMPLANT
SUT ETHILON 3 0 PS 1 (SUTURE) IMPLANT
SUT MNCRL AB 3-0 PS2 18 (SUTURE) IMPLANT
SUT NURALON 4 0 TR CR/8 (SUTURE) ×12 IMPLANT
SUT SILK 0 TIES 10X30 (SUTURE) IMPLANT
SUT VIC AB 2-0 CP2 18 (SUTURE) ×6 IMPLANT
TOWEL GREEN STERILE (TOWEL DISPOSABLE) ×4 IMPLANT
TOWEL GREEN STERILE FF (TOWEL DISPOSABLE) ×4 IMPLANT
TRAY FOLEY MTR SLVR 16FR STAT (SET/KITS/TRAYS/PACK) ×4 IMPLANT
TUBE CONNECTING 12'X1/4 (SUCTIONS) ×1
TUBE CONNECTING 12X1/4 (SUCTIONS) ×3 IMPLANT
UNDERPAD 30X36 HEAVY ABSORB (UNDERPADS AND DIAPERS) ×2 IMPLANT
WATER STERILE IRR 1000ML POUR (IV SOLUTION) ×4 IMPLANT

## 2020-08-04 NOTE — Op Note (Signed)
Procedure(s): RIGHT PARIETAL OCCIPITAL CRANIOTOMY TUMOR EXCISION APPLICATION OF CRANIAL NAVIGATION Procedure Note  Francisco [name] male 74 y.o. 08/04/2020  Procedure(s) and Anesthesia Type:    * RIGHT PARIETAL OCCIPITAL CRANIOTOMY TUMOR EXCISION - General    * APPLICATION OF CRANIAL NAVIGATION - General  Surgeon(s) and Role:    Marcello Moores, Dorcas Carrow, MD - Primary    Ashok Pall, MD - Assisting   Indications: This is a 74 year old man with CHF, cardiac disease status post pacer maker, history of Merkel cell carcinoma in remission who presented to the hospital after seizure.  He was found to have a large 6 cm mass in his parietal lobe and occipital lobe that was also involving the corpus callosum.  There was significant mass-effect.  Patient had a left hemifield cut, apraxias evident, and some weakness on the left side as well as dysarthria.  Additionally, he had some cognitive deficits noted.  Metastatic work-up was negative and unfortunately patient was not able to have an MRI of his brain due to his pacemaker.  I had a long discussion with the patient as well as his sister regarding treatment options.  Given the significant size of this mass as well as the need for diagnosis, I offered the option of craniotomy for surgical resection for diagnostic and potential cytoreductive benefit.  I discussed with the patient that likely this mass will require additional therapy beyond surgery, including chemotherapy and radiation, and if he did not feel he could commit to adjuvant therapies, another option would to consider transition to hospice.  Patient wished to proceed with aggressive treatment.  He and his family were in agreement and informed consent was obtained after discussion of risks, benefits, alternatives, and expected convalescence.  Risks discussed included, but were not limited to, bleeding, pain, infection, seizure, scar, stroke, neurologic deficit, recurrence, spinal fluid leak, coma, and  death.     Surgeon: Vallarie Mare   Assistants: Dr. Ashok Pall, MD.  Please note there were no qualified trainees available to assist with the procedure and assisted required for aid in retraction of normal brain during resection.  Anesthesia: General endotracheal anesthesia   Procedure Detail  RIGHT PARIETAL OCCIPITAL CRANIOTOMY TUMOR EXCISION, APPLICATION OF CRANIAL NAVIGATION  Patient was brought to the operating room.  General anesthesia was induced and patient was intubated by anesthesia service.  After appropriate lines and monitors were placed, patient was positioned supine with a shoulder bump with the head turned to the left.  A Mayfield head holder was placed and he was affixed to the bed.  A preoperative thin cut CT was reconstructed into a 3D image.  This allowed for surface match registration using the [facility] navigation system.  Using the cranial navigation, a incision was planned over the parietal lobe with appropriate trajectory to allow for maximal resection.  The scalp was clipped, preprepped with alcohol and prepped and draped in sterile fashion.  1% lidocaine with epinephrine was injected skin.  Preoperative antibiotics, dexamethasone, Keppra, and mannitol were given.  A timeout was performed.  Incision was made with a 10 blade and the galea and periosteum was cut and dissected out the skull.  Self-retaining retractor was used.  Bur holes were placed with a high-speed drill which allowed for dissection of the dura from the inner table skull.  Craniotome was used to point perform a craniotomy just next to midline.  Meticulous epidural hemostasis obtained.  The dura was opened in a C-shaped manner with 15 blade and  Metzenbaum scissors.  The dura was flapped medially.  Using the navigation, a superior parietal corticectomy was planned.  The pia was bipolared and cut sharply.  A generous corticectomy was made and the superficial aspect of the tumor was identified.  Circumferential  dissection of the tumor from the surrounding white matter was performed along with superior and inferior portions as well as the lateral portions.  The tumor was very vascular and there were fair amount of large white matter vascular feeders that were coagulated and cut.  Dissection continued medially and the falcine surface of the pia was involved with tumor and there were Folstein feeders that were coagulated and cut sharply.  This dissection continued to the anterior falx where the corpus callosum was identified.  The splenium of the corpus callosum was truncated.  It did appear there was tumor involving the splenium crossing to the other hemisphere but as he did not have an MRI to assess the full extent of the tumor, it was felt that more aggressive resection of the splenium would likely incur risks of deficit without clear benefit.  The deep surface of the tumor was then liberated from large periventricular feeders which were coagulated and cut sharply.  The ependymal wall was kept intact.  Frozen section was sent and returned as malignancy, possibly metastatic.  The tumor was thus resected in an extracapsular manner, though in order to expose the deep portion of the tumor, the internal necrotic center was developed.  Meticulous hemostasis was obtained.  The dura was then closed with 4-0 Nurolon in a running fashion.  The stitch line was reinforced with adheren glue.  Meticulous epidural hemostasis obtained.  The bone flap was replaced with Stryker cranial plating system.  The skin was closed with 2-0 Vicryl stitches in buried fashion followed by staples and a sterile dressing.  Patient was then removed from the Mayfield head holder and extubated by the anesthesia service.  All counts were correct at the end of surgery.  No complications were noted.  Findings: Frozen section was consistent with malignant brain tumor, unclear if metastatic or primary  Estimated Blood Loss: 550 mL         Drains: None          Blood Given: 1 unit PRBCs         Specimens: Right parietal lobe mass         Implants: Stryker cranial plating system        Complications:  * No complications entered in OR log *         Disposition: PACU - hemodynamically stable.         Condition: stable

## 2020-08-04 NOTE — Anesthesia Procedure Notes (Signed)
Procedure Name: Intubation Date/Time: 08/04/2020 12:28 PM Performed by: Lance Coon, CRNA Pre-anesthesia Checklist: Patient identified, Emergency Drugs available, Suction available, Patient being monitored and Timeout performed Patient Re-evaluated:Patient Re-evaluated prior to induction Oxygen Delivery Method: Circle system utilized Preoxygenation: Pre-oxygenation with 100% oxygen Induction Type: IV induction Ventilation: Mask ventilation without difficulty Laryngoscope Size: Miller and 3 Grade View: Grade I Tube type: Oral Tube size: 7.5 mm Number of attempts: 1 Airway Equipment and Method: Stylet Placement Confirmation: ETT inserted through vocal cords under direct vision,  positive ETCO2 and breath sounds checked- equal and bilateral Secured at: 22 cm Tube secured with: Tape Dental Injury: Teeth and Oropharynx as per pre-operative assessment

## 2020-08-04 NOTE — Anesthesia Postprocedure Evaluation (Signed)
Anesthesia Post Note  Patient: Francisco [name]  Procedure(s) Performed: RIGHT PARIETAL OCCIPITAL CRANIOTOMY TUMOR EXCISION (Right Head) APPLICATION OF CRANIAL NAVIGATION (Right )     Patient location during evaluation: PACU Anesthesia Type: General Level of consciousness: awake and alert Pain management: pain level controlled Vital Signs Assessment: post-procedure vital signs reviewed and stable Respiratory status: spontaneous breathing, nonlabored ventilation, respiratory function stable and patient connected to nasal cannula oxygen Cardiovascular status: blood pressure returned to baseline and stable Postop Assessment: no apparent nausea or vomiting Anesthetic complications: no   No complications documented.  Last Vitals:  Vitals:   08/04/20 2045 08/04/20 2100  BP: (!) 111/52 (!) 113/55  Pulse: 65 67  Resp: (!) 21 18  Temp:    SpO2: 98% 100%    Last Pain:  Vitals:   08/04/20 2000  TempSrc: Axillary  PainSc:                  Fiero,Merril Nagy A.

## 2020-08-04 NOTE — Progress Notes (Signed)
Gae Dry with [employer] notified of patient's surgery this am and stated to place a magnet over the device during surgery. Relayed information to Dr. Josephine Igo, anesthesiologist. Verbalized understanding.

## 2020-08-04 NOTE — Progress Notes (Signed)
Subjective: Patient reports no headache  Objective: Vital signs in last 24 hours: Temp:  [98.1 F (36.7 C)-98.8 F (37.1 C)] 98.4 F (36.9 C) (09/29 0800) Pulse Rate:  [65-77] 65 (09/29 0800) Resp:  [15-21] 15 (09/29 0800) BP: (114-126)/(59-66) 121/64 (09/29 0800) SpO2:  [93 %-100 %] 100 % (09/29 0800) Weight:  [87 kg] 87 kg (09/29 1044)  Intake/Output from previous day: 09/28 0701 - 09/29 0700 In: 517 [P.O.:517] Out: 200 [Urine:200] Intake/Output this shift: Total I/O In: -  Out: 325 [Urine:325] Awake, alert, oriented to person, month, place Mild cognitive deficits noted Left visual field cut Left pronator drift Mild dysarthria  Lab Results: Recent Labs    08/03/20 0346 08/04/20 0722  WBC 6.4 14.8*  HGB 10.2* 10.6*  HCT 29.2* 30.8*  PLT 228 249   BMET Recent Labs    08/03/20 0346 08/04/20 0722  NA 133* 137  K 4.4 4.1  CL 99 103  CO2 24 23  GLUCOSE 154* 126*  BUN 24* 22  CREATININE 1.35* 1.22  CALCIUM 9.3 9.6    Studies/Results: CT HEAD WO CONTRAST  Result Date: 08/04/2020 CLINICAL DATA:  Brain mass or lesion; brain lab protocol.  Seizure. EXAM: CT HEAD WITHOUT CONTRAST TECHNIQUE: Contiguous axial images were obtained from the base of the skull through the vertex without intravenous contrast. COMPARISON:  Prior head CT 08/03/2020 and head CT 08/02/2020. FINDINGS: Brain: Unchanged size of a mass centered within the paramedian right parietooccipital lobes. The mass measures 6.1 x 4.7 x 5.1 cm (AP x TV x CC). As before, the mass abuts the right aspect of the falx posteriorly and subtly crosses midline. Also unchanged, the mass encroaches upon the posterior aspect of the right lateral ventricle. Unchanged surrounding edema within the posterior right cerebral white matter and callosal splenium. Unchanged local mass effect. Stable leftward midline shift at the level of the septum pellucidum measuring 3 mm. No evidence of acute intracranial hemorrhage. No extra-axial  fluid collection. Stable background mild generalized cerebral atrophy and chronic small vessel ischemic disease. Redemonstrated chronic lacunar infarcts within the left basal ganglia and thalamus. Vascular: No hyperdense vessel.  Atherosclerotic calcifications. Skull: Normal. Negative for fracture or focal lesion. Sinuses/Orbits: Visualized orbits show no acute finding. No significant paranasal sinus disease or mastoid effusion at the imaged levels. IMPRESSION: Unchanged 6.1 cm mass centered within the paramedian right parietooccipital lobes with surrounding edema as detailed above. Please refer to the prior examination of 08/02/2020 for differential considerations. Unchanged local mass effect with 3 mm leftward midline shift. Stable background generalized cerebral atrophy and chronic small vessel ischemic disease. Electronically Signed   By: Kellie Simmering DO   On: 08/04/2020 10:42   CT Head Wo Contrast  Addendum Date: 08/03/2020   ADDENDUM REPORT: 08/03/2020 13:19 ADDENDUM: Correction.  In the addendum the mass measures 4 x 6 cm, not mm. Electronically Signed   By: Franchot Gallo M.D.   On: 08/03/2020 13:19   Result Date: 08/03/2020 CLINICAL DATA:  Seizure EXAM: CT HEAD WITHOUT CONTRAST CT CERVICAL SPINE WITHOUT CONTRAST TECHNIQUE: Multidetector CT imaging of the head and cervical spine was performed following the standard protocol without intravenous contrast. Multiplanar CT image reconstructions of the cervical spine were also generated. COMPARISON:  CT head 12/30/2018 FINDINGS: CT HEAD FINDINGS Brain: Interval development of large mass in the right occipital lobe. This is mildly hyperdense to cortex. This appears infiltrative and abuts the falx. The mass appears to cross the midline posterior to the splenium. There is  edema in the splenium as well as in the white matter surrounding the mass. The mass measures approximately 6 x 4 cm. There is local mass-effect and mild midline shift to the left. No  hydrocephalus. Negative for acute hemorrhage.  No acute infarct. Vascular: Negative for hyperdense vessel Skull: Negative Sinuses/Orbits: Negative Other: None CT CERVICAL SPINE FINDINGS Alignment: 1 mm anterolisthesis C3-4. Skull base and vertebrae: Negative for fracture or mass. Soft tissues and spinal canal: Negative for mass or adenopathy. Atherosclerotic calcification in the carotid artery bilaterally. Probable significant stenosis in the carotid artery bilaterally. Disc levels: Multilevel disc degeneration and facet degeneration throughout the cervical spine with spurring and foraminal stenosis bilaterally due to spurring. Upper chest: Lung apices clear bilaterally. Other: None IMPRESSION: 1. 4 x 6 mm mass in the right occipital lobe with surrounding edema. The mass is mildly hyper dense to cortex. This is most likely a neoplasm. Favor glioblastoma. Differential diagnosis includes metastatic disease, lymphoma, meningioma. Recommend MRI brain without with contrast. 2. Negative for cervical spine fracture 3. Extensive atherosclerotic calcification of the carotid artery bilaterally, likely causing significant stenosis especially on the right. Electronically Signed: By: Franchot Gallo M.D. On: 08/02/2020 20:43   CT HEAD W & WO CONTRAST  Addendum Date: 08/03/2020   ADDENDUM REPORT: 08/03/2020 13:26 ADDENDUM: In addition, post-contrast imaging was performed which demonstrates irregular, peripheral enhancement of the mass. 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 Electronically Signed   By: Margaretha Sheffield MD   On: 08/03/2020 13:26   Result Date: 08/03/2020 CLINICAL DATA:  Brain CNS neoplasm. EXAM: CT HEAD WITHOUT AND WITH CONTRAST TECHNIQUE: Contiguous axial images were obtained from the base of the skull through the vertex without and with intravenous contrast CONTRAST:  137m OMNIPAQUE IOHEXOL 300 MG/ML  SOLN COMPARISON:  CT head 08/02/2020 FINDINGS: Brain: Similar  appearance of a large mass in the right occipital lobe which is mildly hyperdense to cortex, which measures approximately 6 x 4 cm. As before, the mass abuts the falx. Similar surrounding edema in the white matter and splenium of the corpus callosum. Similar local mass effect and mild leftward midline shift. No evidence of acute hemorrhage or acute large vascular territory infarct. No progressive ventricular enlargement. Vascular: Calcific atherosclerosis. Skull: Normal. Negative for fracture or focal lesion. Sinuses/Orbits: Clear sinuses.  No acute orbital abnormality. Other: No mastoid effusion. IMPRESSION: Similar size and appearance of the approximately 4 x 6 cm mass within the right occipital lobe with surrounding edema, as described on recent prior CT head. Similar mass effect. If the patient is able, MRI could further characterize. Electronically Signed: By: FMargaretha SheffieldMD On: 08/03/2020 13:03   CT Cervical Spine Wo Contrast  Addendum Date: 08/03/2020   ADDENDUM REPORT: 08/03/2020 13:19 ADDENDUM: Correction.  In the addendum the mass measures 4 x 6 cm, not mm. Electronically Signed   By: CFranchot GalloM.D.   On: 08/03/2020 13:19   Result Date: 08/03/2020 CLINICAL DATA:  Seizure EXAM: CT HEAD WITHOUT CONTRAST CT CERVICAL SPINE WITHOUT CONTRAST TECHNIQUE: Multidetector CT imaging of the head and cervical spine was performed following the standard protocol without intravenous contrast. Multiplanar CT image reconstructions of the cervical spine were also generated. COMPARISON:  CT head 12/30/2018 FINDINGS: CT HEAD FINDINGS Brain: Interval development of large mass in the right occipital lobe. This is mildly hyperdense to cortex. This appears infiltrative and abuts the falx. The mass appears to cross the midline posterior to the splenium. There  is edema in the splenium as well as in the white matter surrounding the mass. The mass measures approximately 6 x 4 cm. There is local mass-effect and mild  midline shift to the left. No hydrocephalus. Negative for acute hemorrhage.  No acute infarct. Vascular: Negative for hyperdense vessel Skull: Negative Sinuses/Orbits: Negative Other: None CT CERVICAL SPINE FINDINGS Alignment: 1 mm anterolisthesis C3-4. Skull base and vertebrae: Negative for fracture or mass. Soft tissues and spinal canal: Negative for mass or adenopathy. Atherosclerotic calcification in the carotid artery bilaterally. Probable significant stenosis in the carotid artery bilaterally. Disc levels: Multilevel disc degeneration and facet degeneration throughout the cervical spine with spurring and foraminal stenosis bilaterally due to spurring. Upper chest: Lung apices clear bilaterally. Other: None IMPRESSION: 1. 4 x 6 mm mass in the right occipital lobe with surrounding edema. The mass is mildly hyper dense to cortex. This is most likely a neoplasm. Favor glioblastoma. Differential diagnosis includes metastatic disease, lymphoma, meningioma. Recommend MRI brain without with contrast. 2. Negative for cervical spine fracture 3. Extensive atherosclerotic calcification of the carotid artery bilaterally, likely causing significant stenosis especially on the right. Electronically Signed: By: Franchot Gallo M.D. On: 08/02/2020 20:43   CT CHEST ABDOMEN PELVIS W CONTRAST  Result Date: 08/03/2020 CLINICAL DATA:  CNS neoplasm. Evaluate for primary or metastatic disease. EXAM: CT CHEST, ABDOMEN, AND PELVIS WITH CONTRAST TECHNIQUE: Multidetector CT imaging of the chest, abdomen and pelvis was performed following the standard protocol during bolus administration of intravenous contrast. CONTRAST:  130m OMNIPAQUE IOHEXOL 300 MG/ML  SOLN COMPARISON:  Chest CT 09/18/2019. PET 09/13/2018. Abdominopelvic CT 12/01/2017. FINDINGS: CT CHEST FINDINGS Cardiovascular: Aortic atherosclerosis. Tortuous thoracic aorta. Mild cardiomegaly, without pericardial effusion. Multivessel coronary artery atherosclerosis. Aortic valve  calcification. Pacer. Mediastinum/Nodes: No axillary adenopathy. No mediastinal or hilar adenopathy. Tiny hiatal hernia. Lungs/Pleura: No pleural fluid.  Chronic left pleural calcification. Mild centrilobular and paraseptal emphysema. 3 mm right middle lobe pulmonary nodule on 75/4 is similar back to 11/30/2017 and can be presumed benign. A more central right middle lobe peribronchovascular 5 mm nodule on 96/4 is also present back to 11/30/2017, presumed benign. Musculoskeletal: No acute osseous abnormality. CT ABDOMEN PELVIS FINDINGS Hepatobiliary: Mild degradation secondary to arm position, not raised above the head. Normal liver. Normal gallbladder, without biliary ductal dilatation. Pancreas: Normal, without mass or ductal dilatation. Spleen: Normal in size, without focal abnormality. Adrenals/Urinary Tract: Normal adrenal glands. Normal kidneys, without hydronephrosis. Normal urinary bladder. Stomach/Bowel: Proximal gastric underdistention. Moderate stool within the rectum and remainder of the colon. Normal terminal ileum and appendix. Normal small bowel. Vascular/Lymphatic: Advanced aortic and branch vessel atherosclerosis. Significant calcification at the origin of the SMA, likely indicative of stenosis. No abdominopelvic adenopathy. Reproductive: Normal prostate. Other: No significant free fluid. No evidence of omental or peritoneal disease. Musculoskeletal: Degenerative partial fusion of the left sacroiliac joint. Lumbosacral spondylosis. IMPRESSION: 1. No acute process or evidence of primary malignancy/metastatic disease within the chest, abdomen, or pelvis. 2. Tiny hiatal hernia. 3. Aortic Atherosclerosis (ICD10-I70.0) and Emphysema (ICD10-J43.9). Coronary artery atherosclerosis. 4. Aortic valvular calcifications. Consider echocardiography to evaluate for valvular dysfunction. 5. Mild degradation secondary to patient arm position, not raised above the head. Electronically Signed   By: KAbigail MiyamotoM.D.    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  Risks, benefits, alternatives, and expected convalescence was discussed.  Risks discussed included but were not limited to bleeding, pain, infection, seizure, stroke, scar, recurrence, cerebrospinal fluid leak, neurologic deficit, paralysis, coma and death.  I also discussed with her the possibility of blood transfusion during surgery and consent to transfuse blood products if necessary was also obtained.  All questions and concerns were answered and understanding and agreement with the plan was verbalized.    Vallarie Mare 08/04/2020, 10:59 AM

## 2020-08-04 NOTE — Anesthesia Preprocedure Evaluation (Addendum)
Anesthesia Evaluation  Patient identified by MRN, date of birth, ID band Patient awake    Reviewed: Allergy & Precautions, NPO status , Patient's Chart, lab work & pertinent test results  Airway Mallampati: II  TM Distance: >3 FB Neck ROM: Full    Dental  (+) Edentulous Upper, Edentulous Lower   Pulmonary shortness of breath, COPD,  COPD inhaler, Current Smoker and Patient abstained from smoking.,    Pulmonary exam normal breath sounds clear to auscultation       Cardiovascular hypertension, Pt. on medications + angina +CHF  Normal cardiovascular exam+ dysrhythmias + pacemaker + Valvular Problems/Murmurs  Rhythm:Regular Rate:Normal  EKG 08/04/20 Atrial sensed ventricular paced  Echo 07/01/15 Left ventricle: The cavity size was normal. Wall thickness was increased in a pattern of moderate LVH. Systolic function was normal. The estimated ejection fraction was in the range of 55% to 60%. Doppler parameters are consistent with abnormal leftventricular relaxation (grade 1 diastolic dysfunction).  - Aortic valve: There was mild regurgitation.  - Mitral valve: Moderately calcified annulus. There was mild regurgitation.  - Left atrium: The atrium was moderately to severely dilated.    Neuro/Psych Seizures -,  PSYCHIATRIC DISORDERS Right parieto occipital brain tumor     GI/Hepatic PUD, GERD  Medicated and Controlled,(+)     substance abuse  alcohol use, Celiac disease Hx/o Merkel cell tumor Parotid gland S/P RT   Endo/Other  diabetes, Well Controlled, Type 2  Renal/GU Renal disease  negative genitourinary   Musculoskeletal negative musculoskeletal ROS (+)   Abdominal   Peds  Hematology  (+) anemia ,   Anesthesia Other Findings   Reproductive/Obstetrics                            Anesthesia Physical Anesthesia Plan  ASA: III  Anesthesia Plan: General   Post-op Pain Management:     Induction: Intravenous  PONV Risk Score and Plan: 2 and Ondansetron, Treatment may vary due to age or medical condition and Dexamethasone  Airway Management Planned: Oral ETT  Additional Equipment: Arterial line  Intra-op Plan:   Post-operative Plan: Extubation in OR  Informed Consent: I have reviewed the patients History and Physical, chart, labs and discussed the procedure including the risks, benefits and alternatives for the proposed anesthesia with the patient or authorized representative who has indicated his/her understanding and acceptance.   Patient has DNR.  Suspend DNR and Discussed DNR with patient.   Dental advisory given  Plan Discussed with: Anesthesiologist and CRNA  Anesthesia Plan Comments:         Anesthesia Quick Evaluation

## 2020-08-04 NOTE — Progress Notes (Signed)
Megan NP returned called immediately with plan to hold patient NPO until further conformation if patient  will be going to the OR tomorrow.

## 2020-08-04 NOTE — Anesthesia Procedure Notes (Signed)
Arterial Line Insertion Start/End9/29/2021 11:10 AM, 08/04/2020 11:15 AM Performed by: Josephine Igo, MD, anesthesiologist  Preanesthetic checklist: patient identified, IV checked, site marked, risks and benefits discussed, surgical consent, monitors and equipment checked, pre-op evaluation, timeout performed and anesthesia consent Lidocaine 1% used for infiltration Left, radial was placed Catheter size: 20 G Hand hygiene performed  and maximum sterile barriers used  Allen's test indicative of satisfactory collateral circulation Attempts: 2 Procedure performed without using ultrasound guided technique. Following insertion, dressing applied and Biopatch. Post procedure assessment: normal  Patient tolerated the procedure well with no immediate complications.

## 2020-08-04 NOTE — Transfer of Care (Signed)
Immediate Anesthesia Transfer of Care Note  Patient: Francisco [name]  Procedure(s) Performed: RIGHT PARIETAL OCCIPITAL CRANIOTOMY TUMOR EXCISION (Right Head) APPLICATION OF CRANIAL NAVIGATION (Right )  Patient Location: PACU  Anesthesia Type:General  Level of Consciousness: drowsy and responds to stimulation  Airway & Oxygen Therapy: Patient Spontanous Breathing and Patient connected to face mask oxygen  Post-op Assessment: Report given to RN and Post -op Vital signs reviewed and stable  Post vital signs: Reviewed and stable  Last Vitals:  Vitals Value Taken Time  BP 132/59 08/04/20 1741  Temp 36.2 C 08/04/20 1726  Pulse 60 08/04/20 1750  Resp 19 08/04/20 1750  SpO2 98 % 08/04/20 1750  Vitals shown include unvalidated device data.  Last Pain:  Vitals:   08/04/20 1741  TempSrc:   PainSc: Asleep         Complications: No complications documented.

## 2020-08-04 NOTE — Progress Notes (Addendum)
PROGRESS NOTE    [name]   IRC:[id number]  DOB: 20-Jan-1946  DOA: 08/02/2020 PCP: Francisco Hausen, PA   Brief Narrative:  Francisco [name] 74 year old male was at [facility] assisted living with past medical history of seizure disorder, alcohol abuse (in remission), hypertension, hyperlipidemia, celiac disease, diastolic congestive heart failure (EF 55-60% via Echo 2016), history of [place] parotid tumor (excised/radiated 08/2018), bilateral carotid stenosis, gout who presents to [facility] emergency department from his assisted living facility due to witnessed seizures. He had 3 seizures after which EMS was called and he was brought to the hospital. Upon eval in the ED he was found to have a right occipital lobe lesion measuring 6 cm x 4 cm with surrounding edema and evidence of midline shift to the left.  Neurosurgery recommended initiating Decadron.  Neurology recommended a IV Keppra bolus The patient was [facility] to the [facility]. After further history was obtained it was determined that the patient was having weakness and frequent falls over the past couple of weeks.  Also noted was peripheral vision loss.  Subjective: He has no complaints today. 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      Consultants:   NS Procedures:     Antimicrobials:  Anti-infectives (From admission, onward)   Start     Dose/Rate Route Frequency Ordered Stop   08/04/20 0800  ceFAZolin (ANCEF) IVPB 2g/100 mL premix        2 g 200 mL/hr over 30 Minutes Intravenous To [facility] 08/04/20 0713 08/04/20 1237       Objective: Vitals:   08/03/20 1935 08/03/20 2335 08/04/20 0800 08/04/20 1044  BP: 126/60  121/64   Pulse: 77  65   Resp: 20  15   Temp: 98.8 F (37.1 C) 98.3 F (36.8 C) 98.4 F (36.9 C)   TempSrc: Oral Oral Oral   SpO2: 94%  100%   Weight:    87 kg  Height:    5' 9.02" (1.753 m)    Intake/Output Summary (Last 24 hours) at 08/04/2020 1729 Last data filed at 08/04/2020 1701 Gross per 24 hour  Intake 2555 ml  Output 2325 ml  Net 230 ml   Filed Weights   08/02/20 1907 08/04/20 1044  Weight: 87 kg 87 kg    Examination: General exam: Appears comfortable  HEENT: PERRLA, oral mucosa moist, no sclera icterus or thrush Respiratory system: Clear to auscultation. Respiratory effort normal. Cardiovascular system: S1 & S2 heard, RRR.   Gastrointestinal system: Abdomen soft, non-tender, nondistended. Normal bowel sounds. Central nervous system: Alert and oriented. No focal neurological deficits. Extremities: No cyanosis, clubbing or edema Skin: No rashes or ulcers Psychiatry:  Mood & affect appropriate.     Data Reviewed: I have personally reviewed following labs and imaging studies  CBC: Recent Labs  Lab 08/02/20 1930 08/03/20 0346 08/04/20 0722 08/04/20 1333 08/04/20 1515  WBC 8.3 6.4 14.8*  --   --   NEUTROABS 6.1 5.9  --   --   --   HGB 10.4*  10.2* 10.6* 10.2* 10.2*  HCT 30.5* 29.2* 30.8* 30.0* 30.0*  MCV 88.9 87.7 87.0  --   --   PLT 240 228 249  --   --    Basic Metabolic Panel: Recent Labs  Lab 08/02/20 1930 08/03/20 0346 08/04/20 0722 08/04/20 1333 08/04/20 1515  NA 131* 133* 137 135 135  K 3.9 4.4 4.1 3.9 4.2  CL 97* 99 103  --   --   CO2 26 24 23   --   --   GLUCOSE 105* 154* 126*  --   --   BUN 26* 24* 22  --   --   CREATININE 1.61* 1.35* 1.22  --   --   CALCIUM 9.2 9.3 9.6  --   --   MG  --  1.5* 1.5*  --   --    GFR: Estimated Creatinine Clearance: 58 mL/min (by C-G formula based on SCr of 1.22 mg/dL). Liver Function Tests: Recent Labs  Lab 08/02/20 1930 08/03/20 0346  AST 21 19  ALT 14 12  ALKPHOS 70 67  BILITOT 0.2* 0.5  PROT 7.2 6.9  ALBUMIN 4.0 3.6   No results for input(s): LIPASE, AMYLASE in the last 168 hours. No results for input(s): AMMONIA in the last 168 hours. Coagulation Profile: Recent Labs  Lab 08/03/20 0346  INR 1.0   Cardiac Enzymes: No results for input(s): CKTOTAL, CKMB, CKMBINDEX, TROPONINI in the last 168 hours. BNP (last 3 results) No results for input(s): PROBNP in the last 8760 hours. HbA1C: Recent Labs    08/03/20 0043  HGBA1C 6.9*   CBG: Recent Labs  Lab 08/03/20 1233 08/03/20 1546 08/03/20 2336 08/04/20 0521 08/04/20 1021  GLUCAP 140* 163* 176* 139* 141*   Lipid Profile: No results for input(s): CHOL, HDL, LDLCALC, TRIG, CHOLHDL, LDLDIRECT in the last 72 hours. Thyroid Function Tests: No results for input(s): TSH, T4TOTAL, FREET4, T3FREE, THYROIDAB in the last 72 hours. Anemia Panel: No results for input(s): VITAMINB12, FOLATE, FERRITIN, TIBC, IRON, RETICCTPCT in the last 72 hours. Urine analysis:    Component Value Date/Time   COLORURINE YELLOW 08/02/2020 2208   APPEARANCEUR CLEAR 08/02/2020 2208   LABSPEC 1.009 08/02/2020 2208   PHURINE 5.0 08/02/2020 2208   GLUCOSEU NEGATIVE 08/02/2020 2208   HGBUR NEGATIVE 08/02/2020 2208   BILIRUBINUR  NEGATIVE 08/02/2020 2208   KETONESUR NEGATIVE 08/02/2020 2208   PROTEINUR NEGATIVE 08/02/2020 2208   UROBILINOGEN 0.2 08/13/2014 1010   NITRITE NEGATIVE 08/02/2020 2208   LEUKOCYTESUR NEGATIVE 08/02/2020 2208   Sepsis Labs: @LABRCNTIP (procalcitonin:4,lacticidven:4) ) Recent Results (from the past 240 hour(s))  Respiratory Panel by RT PCR (Flu A&B, Covid) - Urine, Clean Catch     Status: None  Collection Time: 08/02/20 10:08 PM   Specimen: Urine, Clean Catch; Nasopharyngeal  Result Value Ref Range Status   SARS Coronavirus 2 by RT PCR NEGATIVE NEGATIVE Final    Comment: (NOTE) SARS-CoV-2 target nucleic acids are NOT DETECTED.  The SARS-CoV-2 RNA is generally detectable in upper respiratoy specimens during the acute phase of infection. The lowest concentration of SARS-CoV-2 viral copies this assay can detect is 131 copies/mL. A negative result does not preclude SARS-Cov-2 infection and should not be used as the sole basis for treatment or other patient management decisions. A negative result may occur with  improper specimen collection/handling, submission of specimen other than nasopharyngeal swab, presence of viral mutation(s) within the areas targeted by this assay, and inadequate number of viral copies (<131 copies/mL). A negative result must be combined with clinical observations, patient history, and epidemiological information. The expected result is Negative.  Fact Sheet for Patients:  PinkCheek.be  Fact Sheet for Healthcare Providers:  GravelBags.it  This test is no t yet approved or cleared by the [place] FDA and  has been authorized for detection and/or diagnosis of SARS-CoV-2 by FDA under an Emergency Use Authorization (EUA). This EUA will remain  in effect (meaning this test can be used) for the duration of the COVID-19 declaration under Section 564(b)(1) of the Act, 21 U.S.C. section 360bbb-3(b)(1),  unless the authorization is terminated or revoked sooner.     Influenza A by PCR NEGATIVE NEGATIVE Final   Influenza B by PCR NEGATIVE NEGATIVE Final    Comment: (NOTE) The Xpert Xpress SARS-CoV-2/FLU/RSV assay is intended as an aid in  the diagnosis of influenza from Nasopharyngeal swab specimens and  should not be used as a sole basis for treatment. Nasal washings and  aspirates are unacceptable for Xpert Xpress SARS-CoV-2/FLU/RSV  testing.  Fact Sheet for Patients: PinkCheek.be  Fact Sheet for Healthcare Providers: GravelBags.it  This test is not yet approved or cleared by the [place] FDA and  has been authorized for detection and/or diagnosis of SARS-CoV-2 by  FDA under an Emergency Use Authorization (EUA). This EUA will remain  in effect (meaning this test can be used) for the duration of the  Covid-19 declaration under Section 564(b)(1) of the Act, 21  U.S.C. section 360bbb-3(b)(1), unless the authorization is  terminated or revoked. Performed at [facility], [place] [address] [place], [place] [address]   MRSA PCR Screening     Status: None   Collection Time: 08/03/20  6:09 AM   Specimen: Nasal Mucosa; Nasopharyngeal  Result Value Ref Range Status   MRSA by PCR NEGATIVE NEGATIVE Final    Comment:        The GeneXpert MRSA Assay (FDA approved for NASAL specimens only), is one component of a comprehensive MRSA colonization surveillance program. It is not intended to diagnose MRSA infection nor to guide or monitor treatment for MRSA infections. Performed at [facility], [place] [address] [place], [place] [address]   Surgical pcr screen     Status: None   Collection Time: 08/04/20  7:44 AM   Specimen: Nasal Mucosa; Nasal Swab  Result Value Ref Range Status   MRSA, PCR NEGATIVE NEGATIVE Final   Staphylococcus aureus NEGATIVE NEGATIVE Final    Comment: (NOTE) The Xpert SA Assay  (FDA approved for NASAL specimens in patients 1 years of age and older), is one component of a comprehensive surveillance program. It is not intended to diagnose infection nor to guide or monitor treatment. Performed  at [facility], [place] [address], [place], [place] [address]   Aerobic/Anaerobic Culture (surgical/deep wound)     Status: None (Preliminary result)   Collection Time: 08/04/20  2:03 PM   Specimen: PATH Other; Tissue  Result Value Ref Range Status   Specimen Description TISSUE  Final   Special Requests RIGHT PARIETAL OCCIPITAL MASS SPEC A  Final   Gram Stain   Final    FEW WBC PRESENT, PREDOMINANTLY MONONUCLEAR NO ORGANISMS SEEN Performed at [facility], [place] [address] [place], [place] [address]    Culture PENDING  Incomplete   Report Status PENDING  Incomplete         Radiology Studies: CT HEAD WO CONTRAST  Result Date: 08/04/2020 CLINICAL DATA:  Brain mass or lesion; brain lab protocol.  Seizure. EXAM: CT HEAD WITHOUT CONTRAST TECHNIQUE: Contiguous axial images were obtained from the base of the skull through the vertex without intravenous contrast. COMPARISON:  Prior head CT 08/03/2020 and head CT 08/02/2020. FINDINGS: Brain: Unchanged size of a mass centered within the paramedian right parietooccipital lobes. The mass measures 6.1 x 4.7 x 5.1 cm (AP x TV x CC). As before, the mass abuts the right aspect of the falx posteriorly and subtly crosses midline. Also unchanged, the mass encroaches upon the posterior aspect of the right lateral ventricle. Unchanged surrounding edema within the posterior right cerebral white matter and callosal splenium. Unchanged local mass effect. Stable leftward midline shift at the level of the septum pellucidum measuring 3 mm. No evidence of acute intracranial hemorrhage. No extra-axial fluid collection. Stable background mild generalized cerebral atrophy and chronic small vessel ischemic disease. Redemonstrated chronic lacunar  infarcts within the left basal ganglia and thalamus. Vascular: No hyperdense vessel.  Atherosclerotic calcifications. Skull: Normal. Negative for fracture or focal lesion. Sinuses/Orbits: Visualized orbits show no acute finding. No significant paranasal sinus disease or mastoid effusion at the imaged levels. IMPRESSION: Unchanged 6.1 cm mass centered within the paramedian right parietooccipital lobes with surrounding edema as detailed above. Please refer to the prior examination of 08/02/2020 for differential considerations. Unchanged local mass effect with 3 mm leftward midline shift. Stable background generalized cerebral atrophy and chronic small vessel ischemic disease. Electronically Signed   By: Kellie Simmering DO   On: 08/04/2020 10:42   CT Head Wo Contrast  Addendum Date: 08/03/2020   ADDENDUM REPORT: 08/03/2020 13:19 ADDENDUM: Correction.  In the addendum the mass measures 4 x 6 cm, not mm. Electronically Signed   By: Franchot Gallo M.D.   On: 08/03/2020 13:19   Result Date: 08/03/2020 CLINICAL DATA:  Seizure EXAM: CT HEAD WITHOUT CONTRAST CT CERVICAL SPINE WITHOUT CONTRAST TECHNIQUE: Multidetector CT imaging of the head and cervical spine was performed following the standard protocol without intravenous contrast. Multiplanar CT image reconstructions of the cervical spine were also generated. COMPARISON:  CT head 12/30/2018 FINDINGS: CT HEAD FINDINGS Brain: Interval development of large mass in the right occipital lobe. This is mildly hyperdense to cortex. This appears infiltrative and abuts the falx. The mass appears to cross the midline posterior to the splenium. There is edema in the splenium as well as in the white matter surrounding the mass. The mass measures approximately 6 x 4 cm. There is local mass-effect and mild midline shift to the left. No hydrocephalus. Negative for acute hemorrhage.  No acute infarct. Vascular: Negative for hyperdense vessel Skull: Negative Sinuses/Orbits: Negative Other:  None CT CERVICAL SPINE FINDINGS Alignment: 1 mm anterolisthesis C3-4. Skull base  and vertebrae: Negative for fracture or mass. Soft tissues and spinal canal: Negative for mass or adenopathy. Atherosclerotic calcification in the carotid artery bilaterally. Probable significant stenosis in the carotid artery bilaterally. Disc levels: Multilevel disc degeneration and facet degeneration throughout the cervical spine with spurring and foraminal stenosis bilaterally due to spurring. Upper chest: Lung apices clear bilaterally. Other: None IMPRESSION: 1. 4 x 6 mm mass in the right occipital lobe with surrounding edema. The mass is mildly hyper dense to cortex. This is most likely a neoplasm. Favor glioblastoma. Differential diagnosis includes metastatic disease, lymphoma, meningioma. Recommend MRI brain without with contrast. 2. Negative for cervical spine fracture 3. Extensive atherosclerotic calcification of the carotid artery bilaterally, likely causing significant stenosis especially on the right. Electronically Signed: By: Franchot Gallo M.D. On: 08/02/2020 20:43   CT HEAD W & WO CONTRAST  Addendum Date: 08/03/2020   ADDENDUM REPORT: 08/03/2020 13:26 ADDENDUM: In addition, post-contrast imaging was performed which demonstrates irregular, peripheral enhancement of the mass. 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 Electronically Signed   By: Margaretha Sheffield MD   On: 08/03/2020 13:26   Result Date: 08/03/2020 CLINICAL DATA:  Brain CNS neoplasm. EXAM: CT HEAD WITHOUT AND WITH CONTRAST TECHNIQUE: Contiguous axial images were obtained from the base of the skull through the vertex without and with intravenous contrast CONTRAST:  140m OMNIPAQUE IOHEXOL 300 MG/ML  SOLN COMPARISON:  CT head 08/02/2020 FINDINGS: Brain: Similar appearance of a large mass in the right occipital lobe which is mildly hyperdense to cortex, which measures approximately 6 x 4 cm. As before, the mass abuts  the falx. Similar surrounding edema in the white matter and splenium of the corpus callosum. Similar local mass effect and mild leftward midline shift. No evidence of acute hemorrhage or acute large vascular territory infarct. No progressive ventricular enlargement. Vascular: Calcific atherosclerosis. Skull: Normal. Negative for fracture or focal lesion. Sinuses/Orbits: Clear sinuses.  No acute orbital abnormality. Other: No mastoid effusion. IMPRESSION: Similar size and appearance of the approximately 4 x 6 cm mass within the right occipital lobe with surrounding edema, as described on recent prior CT head. Similar mass effect. If the patient is able, MRI could further characterize. Electronically Signed: By: FMargaretha SheffieldMD On: 08/03/2020 13:03   CT Cervical Spine Wo Contrast  Addendum Date: 08/03/2020   ADDENDUM REPORT: 08/03/2020 13:19 ADDENDUM: Correction.  In the addendum the mass measures 4 x 6 cm, not mm. Electronically Signed   By: CFranchot GalloM.D.   On: 08/03/2020 13:19   Result Date: 08/03/2020 CLINICAL DATA:  Seizure EXAM: CT HEAD WITHOUT CONTRAST CT CERVICAL SPINE WITHOUT CONTRAST TECHNIQUE: Multidetector CT imaging of the head and cervical spine was performed following the standard protocol without intravenous contrast. Multiplanar CT image reconstructions of the cervical spine were also generated. COMPARISON:  CT head 12/30/2018 FINDINGS: CT HEAD FINDINGS Brain: Interval development of large mass in the right occipital lobe. This is mildly hyperdense to cortex. This appears infiltrative and abuts the falx. The mass appears to cross the midline posterior to the splenium. There is edema in the splenium as well as in the white matter surrounding the mass. The mass measures approximately 6 x 4 cm. There is local mass-effect and mild midline shift to the left. No hydrocephalus. Negative for acute hemorrhage.  No acute infarct. Vascular: Negative for hyperdense vessel Skull: Negative  Sinuses/Orbits: Negative Other: None CT CERVICAL SPINE FINDINGS Alignment: 1 mm anterolisthesis C3-4. Skull  base and vertebrae: Negative for fracture or mass. Soft tissues and spinal canal: Negative for mass or adenopathy. Atherosclerotic calcification in the carotid artery bilaterally. Probable significant stenosis in the carotid artery bilaterally. Disc levels: Multilevel disc degeneration and facet degeneration throughout the cervical spine with spurring and foraminal stenosis bilaterally due to spurring. Upper chest: Lung apices clear bilaterally. Other: None IMPRESSION: 1. 4 x 6 mm mass in the right occipital lobe with surrounding edema. The mass is mildly hyper dense to cortex. This is most likely a neoplasm. Favor glioblastoma. Differential diagnosis includes metastatic disease, lymphoma, meningioma. Recommend MRI brain without with contrast. 2. Negative for cervical spine fracture 3. Extensive atherosclerotic calcification of the carotid artery bilaterally, likely causing significant stenosis especially on the right. Electronically Signed: By: Franchot Gallo M.D. On: 08/02/2020 20:43   CT CHEST ABDOMEN PELVIS W CONTRAST  Result Date: 08/03/2020 CLINICAL DATA:  CNS neoplasm. Evaluate for primary or metastatic disease. EXAM: CT CHEST, ABDOMEN, AND PELVIS WITH CONTRAST TECHNIQUE: Multidetector CT imaging of the chest, abdomen and pelvis was performed following the standard protocol during bolus administration of intravenous contrast. CONTRAST:  126m OMNIPAQUE IOHEXOL 300 MG/ML  SOLN COMPARISON:  Chest CT 09/18/2019. PET 09/13/2018. Abdominopelvic CT 12/01/2017. FINDINGS: CT CHEST FINDINGS Cardiovascular: Aortic atherosclerosis. Tortuous thoracic aorta. Mild cardiomegaly, without pericardial effusion. Multivessel coronary artery atherosclerosis. Aortic valve calcification. Pacer. Mediastinum/Nodes: No axillary adenopathy. No mediastinal or hilar adenopathy. Tiny hiatal hernia. Lungs/Pleura: No pleural  fluid.  Chronic left pleural calcification. Mild centrilobular and paraseptal emphysema. 3 mm right middle lobe pulmonary nodule on 75/4 is similar back to 11/30/2017 and can be presumed benign. A more central right middle lobe peribronchovascular 5 mm nodule on 96/4 is also present back to 11/30/2017, presumed benign. Musculoskeletal: No acute osseous abnormality. CT ABDOMEN PELVIS FINDINGS Hepatobiliary: Mild degradation secondary to arm position, not raised above the head. Normal liver. Normal gallbladder, without biliary ductal dilatation. Pancreas: Normal, without mass or ductal dilatation. Spleen: Normal in size, without focal abnormality. Adrenals/Urinary Tract: Normal adrenal glands. Normal kidneys, without hydronephrosis. Normal urinary bladder. Stomach/Bowel: Proximal gastric underdistention. Moderate stool within the rectum and remainder of the colon. Normal terminal ileum and appendix. Normal small bowel. Vascular/Lymphatic: Advanced aortic and branch vessel atherosclerosis. Significant calcification at the origin of the SMA, likely indicative of stenosis. No abdominopelvic adenopathy. Reproductive: Normal prostate. Other: No significant free fluid. No evidence of omental or peritoneal disease. Musculoskeletal: Degenerative partial fusion of the left sacroiliac joint. Lumbosacral spondylosis. IMPRESSION: 1. No acute process or evidence of primary malignancy/metastatic disease within the chest, abdomen, or pelvis. 2. Tiny hiatal hernia. 3. Aortic Atherosclerosis (ICD10-I70.0) and Emphysema (ICD10-J43.9). Coronary artery atherosclerosis. 4. Aortic valvular calcifications. Consider echocardiography to evaluate for valvular dysfunction. 5. Mild degradation secondary to patient arm position, not raised above the head. Electronically Signed   By: KAbigail MiyamotoM.D.   On: 08/03/2020 13:31      Scheduled Meds: . [MAR Hold] allopurinol  300 mg Oral Daily  . [MAR Hold] atorvastatin  20 mg Oral Daily  .  chlorhexidine      . [MAR Hold] dexamethasone (DECADRON) injection  4 mg Intravenous Q6H  . [MAR Hold] folic acid  1 mg Oral Daily  . [MAR Hold] insulin aspart  0-15 Units Subcutaneous Q6H  . [MAR Hold] levETIRAcetam  1,000 mg Oral BID  . [MAR Hold] lisinopril  20 mg Oral Daily  . [MAR Hold] metoprolol tartrate  75 mg Oral BID  . [MAR Hold] pantoprazole  40  mg Oral BID AC  . [MAR Hold] tamsulosin  0.4 mg Oral Daily  . [MAR Hold] thiamine  100 mg Oral Daily  . [MAR Hold] vitamin B-12  500 mcg Oral Daily   Continuous Infusions: . sodium chloride 10 mL/hr at 08/04/20 1051     LOS: 1 day      Debbe Odea, MD [facility] Pager: [email or web address] 08/04/2020, 5:29 PM

## 2020-08-05 ENCOUNTER — Inpatient Hospital Stay (HOSPITAL_COMMUNITY): Payer: Medicare HMO

## 2020-08-05 ENCOUNTER — Encounter (HOSPITAL_COMMUNITY): Payer: Self-pay | Admitting: Neurosurgery

## 2020-08-05 ENCOUNTER — Other Ambulatory Visit: Payer: Self-pay | Admitting: Radiation Therapy

## 2020-08-05 DIAGNOSIS — I5032 Chronic diastolic (congestive) heart failure: Secondary | ICD-10-CM

## 2020-08-05 DIAGNOSIS — R569 Unspecified convulsions: Secondary | ICD-10-CM

## 2020-08-05 DIAGNOSIS — K900 Celiac disease: Secondary | ICD-10-CM | POA: Diagnosis not present

## 2020-08-05 DIAGNOSIS — I10 Essential (primary) hypertension: Secondary | ICD-10-CM

## 2020-08-05 DIAGNOSIS — I6523 Occlusion and stenosis of bilateral carotid arteries: Secondary | ICD-10-CM | POA: Diagnosis not present

## 2020-08-05 LAB — BASIC METABOLIC PANEL
Anion gap: 9 (ref 5–15)
BUN: 16 mg/dL (ref 8–23)
CO2: 23 mmol/L (ref 22–32)
Calcium: 9.2 mg/dL (ref 8.9–10.3)
Chloride: 104 mmol/L (ref 98–111)
Creatinine, Ser: 0.93 mg/dL (ref 0.61–1.24)
GFR calc Af Amer: >60 mL/min (ref >60)
GFR calc non Af Amer: >60 mL/min (ref >60)
Glucose, Bld: 154 mg/dL — ABNORMAL HIGH (ref 70–99)
Potassium: 3.8 mmol/L (ref 3.5–5.1)
Sodium: 136 mmol/L (ref 135–145)

## 2020-08-05 LAB — CBC WITH DIFFERENTIAL/PLATELET
Abs Immature Granulocytes: 0.08 10*3/uL — ABNORMAL HIGH (ref 0.00–0.07)
Basophils Absolute: 0 10*3/uL (ref 0.0–0.1)
Basophils Relative: 0 %
Eosinophils Absolute: 0 10*3/uL (ref 0.0–0.5)
Eosinophils Relative: 0 %
HCT: 30 % — ABNORMAL LOW (ref 39.0–52.0)
Hemoglobin: 9.9 g/dL — ABNORMAL LOW (ref 13.0–17.0)
Immature Granulocytes: 1 %
Lymphocytes Relative: 1 %
Lymphs Abs: 0.2 10*3/uL — ABNORMAL LOW (ref 0.7–4.0)
MCH: 29 pg (ref 26.0–34.0)
MCHC: 33 g/dL (ref 30.0–36.0)
MCV: 88 fL (ref 80.0–100.0)
Monocytes Absolute: 0.8 10*3/uL (ref 0.1–1.0)
Monocytes Relative: 5 %
Neutro Abs: 15.6 10*3/uL — ABNORMAL HIGH (ref 1.7–7.7)
Neutrophils Relative %: 93 %
Platelets: 199 10*3/uL (ref 150–400)
RBC: 3.41 MIL/uL — ABNORMAL LOW (ref 4.22–5.81)
RDW: 15.1 % (ref 11.5–15.5)
WBC: 16.7 10*3/uL — ABNORMAL HIGH (ref 4.0–10.5)
nRBC: 0 % (ref 0.0–0.2)

## 2020-08-05 LAB — GLUCOSE, CAPILLARY
Glucose-Capillary: 156 mg/dL — ABNORMAL HIGH (ref 70–99)
Glucose-Capillary: 167 mg/dL — ABNORMAL HIGH (ref 70–99)
Glucose-Capillary: 134 mg/dL — ABNORMAL HIGH (ref 70–99)
Glucose-Capillary: 153 mg/dL — ABNORMAL HIGH (ref 70–99)

## 2020-08-05 MED ORDER — DEXAMETHASONE 0.5 MG PO TABS
1.0000 mg | ORAL_TABLET | Freq: Two times a day (BID) | ORAL | Status: AC
  Administered 2020-08-13 – 2020-08-14 (×4): 1 mg via ORAL
  Filled 2020-08-05 (×4): qty 2

## 2020-08-05 MED ORDER — DEXAMETHASONE SODIUM PHOSPHATE 4 MG/ML IJ SOLN
4.0000 mg | Freq: Four times a day (QID) | INTRAMUSCULAR | Status: AC
  Administered 2020-08-05 (×2): 4 mg via INTRAVENOUS
  Filled 2020-08-05 (×2): qty 1

## 2020-08-05 MED ORDER — DEXMEDETOMIDINE HCL IN NACL 400 MCG/100ML IV SOLN
0.2000 ug/kg/h | INTRAVENOUS | Status: DC
  Administered 2020-08-05: 0.2 ug/kg/h via INTRAVENOUS
  Filled 2020-08-05: qty 100

## 2020-08-05 MED ORDER — DEXAMETHASONE 4 MG PO TABS
2.0000 mg | ORAL_TABLET | Freq: Three times a day (TID) | ORAL | Status: AC
  Administered 2020-08-09 – 2020-08-11 (×6): 2 mg via ORAL
  Filled 2020-08-05 (×6): qty 1

## 2020-08-05 MED ORDER — DEXAMETHASONE 4 MG PO TABS
2.0000 mg | ORAL_TABLET | Freq: Two times a day (BID) | ORAL | Status: AC
  Administered 2020-08-11 – 2020-08-12 (×4): 2 mg via ORAL
  Filled 2020-08-05 (×4): qty 1

## 2020-08-05 MED ORDER — DEXAMETHASONE 0.5 MG PO TABS
1.0000 mg | ORAL_TABLET | Freq: Every day | ORAL | Status: AC
  Administered 2020-08-15 – 2020-08-16 (×2): 1 mg via ORAL
  Filled 2020-08-05 (×2): qty 2

## 2020-08-05 MED ORDER — CHLORDIAZEPOXIDE HCL 5 MG PO CAPS: 10.0000 mg | ORAL_CAPSULE | Freq: Three times a day (TID) | ORAL | Status: DC

## 2020-08-05 MED ORDER — NICOTINE 7 MG/24HR TD PT24
7.0000 mg | MEDICATED_PATCH | Freq: Every day | TRANSDERMAL | Status: DC
  Administered 2020-08-06 – 2020-08-16 (×11): 7 mg via TRANSDERMAL
  Filled 2020-08-05 (×12): qty 1

## 2020-08-05 MED ORDER — DEXAMETHASONE 4 MG PO TABS
4.0000 mg | ORAL_TABLET | Freq: Four times a day (QID) | ORAL | Status: AC
  Administered 2020-08-05 – 2020-08-07 (×6): 4 mg via ORAL
  Filled 2020-08-05 (×7): qty 1

## 2020-08-05 MED ORDER — DEXAMETHASONE 4 MG PO TABS
4.0000 mg | ORAL_TABLET | Freq: Three times a day (TID) | ORAL | Status: AC
  Administered 2020-08-07 – 2020-08-09 (×6): 4 mg via ORAL
  Filled 2020-08-05 (×6): qty 1

## 2020-08-05 MED ORDER — HALOPERIDOL LACTATE 5 MG/ML IJ SOLN
1.0000 mg | INTRAMUSCULAR | Status: DC | PRN
  Administered 2020-08-05: 2 mg via INTRAVENOUS
  Administered 2020-08-07 (×2): 4 mg via INTRAVENOUS
  Filled 2020-08-05 (×3): qty 1

## 2020-08-05 NOTE — Progress Notes (Signed)
Subjective: NAEs o/n. Pt c/o mild HA, improved from yesterday  Objective: Vital signs in last 24 hours: Temp:  [97.1 F (36.2 C)-98.9 F (37.2 C)] 98.6 F (37 C) (09/30 0800) Pulse Rate:  [59-80] 79 (09/30 0630) Resp:  [11-21] 20 (09/30 0630) BP: (109-132)/(49-66) 123/55 (09/30 0630) SpO2:  [95 %-100 %] 98 % (09/30 0630) Arterial Line BP: (116-174)/(40-65) 138/46 (09/30 0630) Weight:  [87 kg] 87 kg (09/29 1044)  Intake/Output from previous day: 09/29 0701 - 09/30 0700 In: 2688.2 [I.V.:1613.2; Blood:315; IV Piggyback:710] Out: 3350 [Urine:2800; Blood:550] Intake/Output this shift: No intake/output data recorded.  Awake, alert, oriented to person, hospital.  Currently working with PT. Left pronator drift, mild dysarthria.  Left hemifield cut Dressing c/d/i  Lab Results: Recent Labs    08/04/20 0722 08/04/20 1333 08/04/20 1515 08/05/20 0432  WBC 14.8*  --   --  16.7*  HGB 10.6*   < > 10.2* 9.9*  HCT 30.8*   < > 30.0* 30.0*  PLT 249  --   --  199   < > = values in this interval not displayed.   BMET Recent Labs    08/04/20 0722 08/04/20 1333 08/04/20 1515 08/05/20 0432  NA 137   < > 135 136  K 4.1   < > 4.2 3.8  CL 103  --   --  104  CO2 23  --   --  23  GLUCOSE 126*  --   --  154*  BUN 22  --   --  16  CREATININE 1.22  --   --  0.93  CALCIUM 9.6  --   --  9.2   < > = values in this interval not displayed.    Studies/Results: CT HEAD WO CONTRAST  Result Date: 08/05/2020 CLINICAL DATA:  Brain/CNS neoplasm, assess treatment response. EXAM: CT HEAD WITHOUT CONTRAST TECHNIQUE: Contiguous axial images were obtained from the base of the skull through the vertex without intravenous contrast. COMPARISON:  Prior head CT examinations 08/04/2020 and earlier. FINDINGS: Brain: Postoperative changes from interval right parietooccipital craniotomy and mass resection. There is small-volume hemorrhage along the periphery of the resection cavity as well as along the right  aspect of the adjacent falx and extending anteriorly within the interhemispheric fissure. Small to moderate volume intraventricular hemorrhage is also present within the lateral and third ventricles. A 1.6 cm focus of hemorrhage along the medial aspect of the posterior right lateral ventricle may be intraventricular or parenchymal (series 3, image 14). Bifrontal extra-axial pneumocephalus. Pneumocephalus is also present within the anterior right lateral ventricle. There is an extra-axial fluid collection deep to the right parietooccipital cranioplasty containing a small amount of acute hemorrhage measuring 0.9 cm in greatest thickness (series 3, image 21). There is apparent residual tumor along the inferior and deep margins of the resection cavity, as well as along the left aspect of the falx (for instance as seen on series 3, images 15-19). No midline shift. Stable generalized cerebral atrophy and chronic small vessel ischemic disease. Redemonstrated chronic lacunar infarcts within the left basal ganglia and thalamus. Vascular: No hyperdense vessel.  Atherosclerotic calcifications. Skull: Right parietooccipital cranioplasty. Overlying scalp soft tissue swelling/edema and scalp staples. Sinuses/Orbits: Visualized orbits show no acute finding. Left mastoid effusion. IMPRESSION: Postoperative changes from interval right parietooccipital craniotomy and mass resection. This includes small-volume hemorrhage along the periphery of the resection cavity and adjacent falx, also extending anteriorly within the interhemispheric fissure. There is also small-to-moderate volume intraventricular hemorrhage within the lateral  and third ventricles. No evidence of hydrocephalus at this time. There has been significant interval tumor debulking. Apparent residual tumor along the inferior and deep aspects of the resection cavity and along the left aspect of the posterior falx. Extra-axial fluid collection deep to the right  parietooccipital cranioplasty measuring 0.9 cm containing a small amount of acute hemorrhage. Pneumocephalus along the anterior frontal lobes and within the right lateral ventricle frontal horn. Electronically Signed   By: Kellie Simmering DO   On: 08/05/2020 08:54   CT HEAD WO CONTRAST  Result Date: 08/04/2020 CLINICAL DATA:  Brain mass or lesion; brain lab protocol.  Seizure. EXAM: CT HEAD WITHOUT CONTRAST TECHNIQUE: Contiguous axial images were obtained from the base of the skull through the vertex without intravenous contrast. COMPARISON:  Prior head CT 08/03/2020 and head CT 08/02/2020. FINDINGS: Brain: Unchanged size of a mass centered within the paramedian right parietooccipital lobes. The mass measures 6.1 x 4.7 x 5.1 cm (AP x TV x CC). As before, the mass abuts the right aspect of the falx posteriorly and subtly crosses midline. Also unchanged, the mass encroaches upon the posterior aspect of the right lateral ventricle. Unchanged surrounding edema within the posterior right cerebral white matter and callosal splenium. Unchanged local mass effect. Stable leftward midline shift at the level of the septum pellucidum measuring 3 mm. No evidence of acute intracranial hemorrhage. No extra-axial fluid collection. Stable background mild generalized cerebral atrophy and chronic small vessel ischemic disease. Redemonstrated chronic lacunar infarcts within the left basal ganglia and thalamus. Vascular: No hyperdense vessel.  Atherosclerotic calcifications. Skull: Normal. Negative for fracture or focal lesion. Sinuses/Orbits: Visualized orbits show no acute finding. No significant paranasal sinus disease or mastoid effusion at the imaged levels. IMPRESSION: Unchanged 6.1 cm mass centered within the paramedian right parietooccipital lobes with surrounding edema as detailed above. Please refer to the prior examination of 08/02/2020 for differential considerations. Unchanged local mass effect with 3 mm leftward midline  shift. Stable background generalized cerebral atrophy and chronic small vessel ischemic disease. Electronically Signed   By: Kellie Simmering DO   On: 08/04/2020 10:42   CT HEAD W & WO CONTRAST  Addendum Date: 08/03/2020   ADDENDUM REPORT: 08/03/2020 13:26 ADDENDUM: In addition, post-contrast imaging was performed which demonstrates irregular, peripheral enhancement of the mass. 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 Electronically Signed   By: Margaretha Sheffield MD   On: 08/03/2020 13:26   Result Date: 08/03/2020 CLINICAL DATA:  Brain CNS neoplasm. EXAM: CT HEAD WITHOUT AND WITH CONTRAST TECHNIQUE: Contiguous axial images were obtained from the base of the skull through the vertex without and with intravenous contrast CONTRAST:  193m OMNIPAQUE IOHEXOL 300 MG/ML  SOLN COMPARISON:  CT head 08/02/2020 FINDINGS: Brain: Similar appearance of a large mass in the right occipital lobe which is mildly hyperdense to cortex, which measures approximately 6 x 4 cm. As before, the mass abuts the falx. Similar surrounding edema in the white matter and splenium of the corpus callosum. Similar local mass effect and mild leftward midline shift. No evidence of acute hemorrhage or acute large vascular territory infarct. No progressive ventricular enlargement. Vascular: Calcific atherosclerosis. Skull: Normal. Negative for fracture or focal lesion. Sinuses/Orbits: Clear sinuses.  No acute orbital abnormality. Other: No mastoid effusion. IMPRESSION: Similar size and appearance of the approximately 4 x 6 cm mass within the right occipital lobe with surrounding edema, as described on recent prior CT head. Similar mass effect. If  the patient is able, MRI could further characterize. Electronically Signed: By: Margaretha Sheffield MD On: 08/03/2020 13:03   CT CHEST ABDOMEN PELVIS W CONTRAST  Result Date: 08/03/2020 CLINICAL DATA:  CNS neoplasm. Evaluate for primary or metastatic disease. EXAM: CT  CHEST, ABDOMEN, AND PELVIS WITH CONTRAST TECHNIQUE: Multidetector CT imaging of the chest, abdomen and pelvis was performed following the standard protocol during bolus administration of intravenous contrast. CONTRAST:  168m OMNIPAQUE IOHEXOL 300 MG/ML  SOLN COMPARISON:  Chest CT 09/18/2019. PET 09/13/2018. Abdominopelvic CT 12/01/2017. FINDINGS: CT CHEST FINDINGS Cardiovascular: Aortic atherosclerosis. Tortuous thoracic aorta. Mild cardiomegaly, without pericardial effusion. Multivessel coronary artery atherosclerosis. Aortic valve calcification. Pacer. Mediastinum/Nodes: No axillary adenopathy. No mediastinal or hilar adenopathy. Tiny hiatal hernia. Lungs/Pleura: No pleural fluid.  Chronic left pleural calcification. Mild centrilobular and paraseptal emphysema. 3 mm right middle lobe pulmonary nodule on 75/4 is similar back to 11/30/2017 and can be presumed benign. A more central right middle lobe peribronchovascular 5 mm nodule on 96/4 is also present back to 11/30/2017, presumed benign. Musculoskeletal: No acute osseous abnormality. CT ABDOMEN PELVIS FINDINGS Hepatobiliary: Mild degradation secondary to arm position, not raised above the head. Normal liver. Normal gallbladder, without biliary ductal dilatation. Pancreas: Normal, without mass or ductal dilatation. Spleen: Normal in size, without focal abnormality. Adrenals/Urinary Tract: Normal adrenal glands. Normal kidneys, without hydronephrosis. Normal urinary bladder. Stomach/Bowel: Proximal gastric underdistention. Moderate stool within the rectum and remainder of the colon. Normal terminal ileum and appendix. Normal small bowel. Vascular/Lymphatic: Advanced aortic and branch vessel atherosclerosis. Significant calcification at the origin of the SMA, likely indicative of stenosis. No abdominopelvic adenopathy. Reproductive: Normal prostate. Other: No significant free fluid. No evidence of omental or peritoneal disease. Musculoskeletal: Degenerative partial  fusion of the left sacroiliac joint. Lumbosacral spondylosis. IMPRESSION: 1. No acute process or evidence of primary malignancy/metastatic disease within the chest, abdomen, or pelvis. 2. Tiny hiatal hernia. 3. Aortic Atherosclerosis (ICD10-I70.0) and Emphysema (ICD10-J43.9). Coronary artery atherosclerosis. 4. Aortic valvular calcifications. Consider echocardiography to evaluate for valvular dysfunction. 5. Mild degradation secondary to patient arm position, not raised above the head. Electronically Signed   By: KAbigail MiyamotoM.D.   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

## 2020-08-05 NOTE — Evaluation (Signed)
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 Pt also reports progressive LE weakness with multiple falls over the past weeks as well as vision changes. CT head reveals new R occipital lesion with surrounding edema and evidence of midline shift. Pt underwent R parietal occipital craniotomy and tumor excision on 08/04/2020.  Clinical Impression  Pt presents to PT with deficits in functional mobility, gait, balance, endurance, strength, cognition, vision, awareness. Pt with significant L visual field cut as well as L inattention noted during session. Pt also presents with L weakness and foot drag during session and requires physical assistance to perform all functional mobility during session. Pt is impulsive at times and requires cues for safety. Pt also demonstrates poor awareness of his current deficits, exacerbating his falls risk. Pt will benefit from continued acute PT POC to reduce falls risk and improve mobility quality. PT recommends CIR at the time of discharge to aide in a return to the pt's reported prior level of function.    Follow Up Recommendations CIR    Equipment Recommendations  Wheelchair (measurements PT);Wheelchair cushion (measurements PT) (if home today)    Recommendations for Other Services Rehab consult     Precautions / Restrictions Precautions Precautions: Fall Precaution Comments: Impuslive; fall out of recliner during admission; large L  neglect/field cut Restrictions Weight Bearing Restrictions: No      Mobility  Bed Mobility Overal bed mobility: Needs Assistance Bed Mobility: Supine to Sit     Supine to sit: Mod assist     General bed mobility comments: assist to sequence overall transfer, to guide BLEs to EOB and to support trunk in upright sitting. Pt with difficulty problem solving/motor planning  Transfers Overall transfer level: Needs assistance Equipment used: Rolling walker (2 wheeled);None Transfers: Sit to/from [facility] Sit to Stand: Mod assist;+2 physical assistance;+2 safety/equipment Stand pivot transfers: Mod assist;Max assist;+2 physical assistance;+2 safety/equipment       General transfer comment: mod A +2 to power up into standing; mod- max A +2 to maintain steadying with pivot transfers (L worse than R) due to poor motor planning and neglect  Ambulation/Gait Ambulation/Gait assistance: Mod assist Gait Distance (Feet): 10 Feet Assistive device: Rolling walker (2 wheeled) Gait Pattern/deviations: Step-to pattern;Drifts right/left;Decreased dorsiflexion - left;Decreased stance time - left Gait velocity: reduced Gait velocity interpretation: <1.8 ft/sec, indicate of risk for recurrent falls General Gait Details: pt with significant L inattention vs field cut requiring direction to avoid bumping into objects on left and to guide pt to desired destination. Pt requires physical assistance to maintain lateral stability and is noted to have L foot drag  Stairs            Wheelchair Mobility    Modified Rankin (Stroke Patients Only)       Balance Overall balance assessment: Needs assistance Sitting-balance support: Feet supported Sitting balance-Leahy Scale: Poor Sitting balance - Comments: intermittent min A to maintain balance due to L neglect   Standing balance support: Bilateral upper extremity supported;During functional activity Standing balance-Leahy Scale:  Zero Standing balance comment: max A +2 to maintain  balance safely in ADL environment                             Pertinent Vitals/Pain Pain Assessment: Faces Faces Pain Scale: Hurts even more Pain Location: posterior skill, neck Pain Descriptors / Indicators: Aching;Sore Pain Intervention(s): Monitored during session;Patient requesting pain meds-RN notified;Repositioned    Home Living Family/patient expects to be discharged to:: Assisted living [facility])               Home Equipment: Gilford Rile - 2 wheels Additional Comments: Pt is questionable historian. States he uses RW on daily basis, has had decr in peripheral vision and leg strength    Prior Function Level of Independence: Needs assistance   Gait / Transfers Assistance Needed: uses RW, has falls  ADL's / Homemaking Assistance Needed: assist as needed from ALF staff        Hand Dominance   Dominant Hand: Right    Extremity/Trunk Assessment   Upper Extremity Assessment Upper Extremity Assessment: Generalized weakness;LUE deficits/detail LUE Deficits / Details: inattention; tone; difficulty using L hand functionally or integrating L hand into bimanual tasks LUE Sensation: decreased proprioception LUE Coordination: decreased gross motor;decreased fine motor    Lower Extremity Assessment Lower Extremity Assessment: Defer to PT evaluation RLE Deficits / Details: RLE grossly 4/5 LLE Deficits / Details: LLE grossly 4-/5, pt noted to have L fot drag with ambulation LLE Coordination: decreased gross motor (will need further assessment)    Cervical / Trunk Assessment Cervical / Trunk Assessment: Kyphotic  Communication   Communication: Expressive difficulties  Cognition Arousal/Alertness: Awake/alert Behavior During Therapy: Impulsive Overall Cognitive Status: Impaired/Different from baseline Area of Impairment: Orientation;Attention;Memory;Following commands;Safety/judgement;Awareness;Problem  solving                 Orientation Level: Disoriented to;Situation Current Attention Level: Sustained Memory: Decreased short-term memory;Decreased recall of precautions Following Commands: Follows one step commands with increased time Safety/Judgement: Decreased awareness of safety;Decreased awareness of deficits Awareness: Intellectual (approaching emergent) Problem Solving: Slow processing;Decreased initiation;Difficulty sequencing;Requires verbal cues;Requires tactile cues General Comments: pt unaware why he was in hospital until cued. Noted marked delay in processing time during ADL tasks (OT Took cup from pt and 2-3 mins later pt started moving hand and stating he was giving the cup back to the OT). At time would state nonsensical phrases. Overall very poor safety awareness      General Comments General comments (skin integrity, edema, etc.): VSS on RA, pt with significant left inattention and with L visual field cut noted during session    Exercises     Assessment/Plan    PT Assessment Patient needs continued PT services  PT Problem List Decreased strength;Decreased balance;Decreased activity tolerance;Decreased mobility;Decreased coordination;Decreased cognition;Decreased knowledge of use of DME;Decreased safety awareness;Decreased knowledge of precautions       PT Treatment Interventions DME instruction;Gait training;Stair training;Functional mobility training;Therapeutic activities;Therapeutic exercise;Balance training;Neuromuscular re-education;Cognitive remediation;Patient/family education    PT Goals (Current goals can be found in the Care Plan section)  Acute Rehab PT Goals Patient Stated Goal: be able to urinate PT Goal Formulation: With patient Time For Goal Achievement: 08/19/20 Potential to Achieve Goals: Fair    Frequency Min 4X/week   Barriers to discharge        Co-evaluation PT/OT/SLP Co-Evaluation/Treatment: Yes Reason for Co-Treatment: For  patient/therapist safety;To address functional/ADL transfers PT goals addressed during session: Mobility/safety with mobility;Balance;Strengthening/ROM OT goals addressed during session: ADL's and self-care;Proper use of Adaptive equipment  and DME;Strengthening/ROM       AM-PAC PT "6 Clicks" Mobility  Outcome Measure Help needed turning from your back to your side while in a flat bed without using bedrails?: A Lot Help needed moving from lying on your back to sitting on the side of a flat bed without using bedrails?: A Lot Help needed moving to and from a bed to a chair (including a wheelchair)?: A Lot Help needed standing up from a chair using your arms (e.g., wheelchair or bedside chair)?: A Lot Help needed to walk in hospital room?: A Lot Help needed climbing 3-5 steps with a railing? : Total 6 Click Score: 11    End of Session Equipment Utilized During Treatment: Gait belt Activity Tolerance: Patient tolerated treatment well Patient left: in chair;with call bell/phone within reach;with chair alarm set (chair alarm belt) Nurse Communication: Mobility status PT Visit Diagnosis: Unsteadiness on feet (R26.81);Other abnormalities of gait and mobility (R26.89);Muscle weakness (generalized) (M62.81);Repeated falls (R29.6);Other symptoms and signs involving the nervous system (R29.898)    Time: 0093-8182 PT Time Calculation (min) (ACUTE ONLY): 32 min   Charges:   PT Evaluation $PT Eval Moderate Complexity: 1 Mod          Zenaida Niece, PT, DPT Acute Rehabilitation Pager: [phone]   Zenaida Niece 08/05/2020, 1:20 PM

## 2020-08-05 NOTE — Progress Notes (Signed)
SLP Cancellation Note  Patient Details Name: Francisco [name] MRN: [id number] DOB: June 20, 1946   Cancelled treatment:       Reason Eval/Treat Not Completed: Other (comment) Claiborne Billings, RN indicated that the pt has been agitated and fell earlier. She requested that the SLP evaluation be deferred to allow pt to rest. SLP will follow up on subsequent date.   Evaleen Sant I. Hardin Negus, [place], [place] Office number [phone] Pager [place] 08/05/2020, 3:09 PM

## 2020-08-05 NOTE — Progress Notes (Signed)
   08/05/20 1500  What Happened  Was fall witnessed? No  Was patient injured? No  Patient found on floor  Found by Staff-comment Delorise Jackson RN)  Stated prior activity other (comment) (sitting in chair)  Follow Up  MD notified MD Lollie Marrow  Time MD notified 1228  Family notified Yes - comment (Sister-[name])  Time family notified 1430  Additional tests Yes-comment (CT head & cervical spine)  Simple treatment Dressing  Adult Fall Risk Assessment  Risk Factor Category (scoring not indicated) Fall has occurred during this admission (document High fall risk)  Patient Fall Risk Level High fall risk  Adult Fall Risk Interventions  Required Bundle Interventions *See Row Information* High fall risk - low, moderate, and high requirements implemented  Additional Interventions Camera surveillance (with patient/family notification & education);Lap belt while in chair/wheelchair;PT/OT need assessed if change in mobility from baseline;Reorient/diversional activities with confused patients;Use of appropriate toileting equipment (bedpan, BSC, etc.)  Screening for Fall Injury Risk (To be completed on HIGH fall risk patients) - Assessing Need for Low Bed  Risk For Fall Injury- Low Bed Criteria None identified - Continue screening  Screening for Fall Injury Risk (To be completed on HIGH fall risk patients who do not meet crieteria for Low Bed) - Assessing Need for Floor Mats Only  Risk For Fall Injury- Criteria for Floor Mats Confusion/dementia (+NuDESC, CIWA, TBI, etc.)  Will Implement Floor Mats Yes  Vitals  Pulse Rate 69  ECG Heart Rate 70  Resp (!) 22  Oxygen Therapy  SpO2 94 %

## 2020-08-05 NOTE — Evaluation (Signed)
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 Pt also reports progressive LE weakness with multiple falls over the past weeks as well as vision changes. CT head reveals new R occipital lesion with surrounding edema and evidence of midline shift. Pt underwent R parietal occipital craniotomy and tumor excision on 08/04/2020.   Clinical Impression   PTA pt living at [facility], using RW and receiving as needed assist for ADL. At time of eval, pt completed bed mobility with mod A and sit <> stands with mod-max A +2. Noted large L neglect causing for difficulty locating ADL items in L sided environment. Pt completed functional mobility to bathroom with max A +2 and RW to manage ataxia/motor planning deficits, but could not locate toilet in bathroom (that was slightly passed midline to L) despite max multimodal cues. BSC then pulled up behind pt for safe transfer. Pt is not able to locate his L hand with his right, and has max difficulty engaging in bimanual tasks. It is also suspected pt has L visual field cut as well. Also noted cognitive deficits in attention, safety, awareness, and orientation to situation. Noted most a marked delay in processing (2-3 mins for some events) with little- no awareness of this. Given current status, recommend SNF at d/c for continued functional progression of ADL and to maintain safety. Will continue to follow per POC listed below.    Follow  Up Recommendations  SNF;Supervision/Assistance - 24 hour    Equipment Recommendations  None recommended by OT    Recommendations for Other Services       Precautions / Restrictions Precautions Precautions: Fall Precaution Comments: Impuslive; fall out of recliner during admission; large L neglect/field cut Restrictions Weight Bearing Restrictions: No      Mobility Bed Mobility Overal bed mobility: Needs Assistance Bed Mobility: Supine to Sit     Supine to sit: Mod assist     General bed mobility comments: assist to sequence overall transfer, to guide BLEs to EOB and to support trunk in upright sitting. Pt with difficulty problem solving/motor planning  Transfers Overall transfer level: Needs assistance Equipment used: Rolling walker (2 wheeled);None Transfers: Sit to/from [facility] Sit to Stand: Mod assist;+2 physical assistance;+2 safety/equipment Stand pivot transfers: Mod assist;Max assist;+2 physical assistance;+2 safety/equipment       General transfer comment: mod A +2 to power up into standing; mod- max A +2 to maintain steadying with pivot transfers (L worse than R) due to poor motor planning and neglect    Balance Overall balance assessment: Needs assistance Sitting-balance support: Feet supported Sitting balance-Leahy Scale: Poor Sitting balance - Comments: intermittent min A to maintain balance due to L neglect   Standing balance support: Bilateral upper extremity supported;During functional activity Standing balance-Leahy Scale: Zero Standing balance comment: max A +2 to maintain balance safely in ADL environment                           ADL either performed or assessed with clinical judgement   ADL Overall ADL's : Needs  assistance/impaired Eating/Feeding: Moderate assistance;Sitting   Grooming: Moderate assistance;Sitting   Upper Body Bathing: Maximal assistance;Sitting;Standing;Cueing for safety;Cueing for  sequencing   Lower Body Bathing: Maximal assistance;Sitting/lateral leans;Sit to/from stand;Cueing for safety;Cueing for sequencing   Upper Body Dressing : Maximal assistance;Sitting;Standing;Cueing for safety;Cueing for sequencing   Lower Body Dressing: Maximal assistance;Sit to/from stand;Sitting/lateral leans;Cueing for safety;Cueing for sequencing   Toilet Transfer: Maximal assistance;+2 for physical assistance;+2 for safety/equipment;Ambulation;Regular Glass blower/designer Details (indicate cue type and reason): max A +2 for stadying assist and navigation due to L neglect. pt not able to locate toilet on L in bathroom and ended up sitting on [facility] Toileting- Clothing Manipulation and Hygiene: Total assistance;Sitting/lateral lean;Sit to/from stand       Functional mobility during ADLs: Maximal assistance;+2 for physical assistance;+2 for safety/equipment;Cueing for safety;Cueing for sequencing;Rolling walker General ADL Comments: ADL participation limited by left neglect     Vision Patient Visual Report: Peripheral vision impairment (reports worsening vision in periphery) Vision Assessment?: Yes Tracking/Visual Pursuits: Impaired - to be further tested in functional context;Requires cues, head turns, or add eye shifts to track;Decreased smoothness of eye movement to LEFT inferior field;Decreased smoothness of eye movement to LEFT superior field Visual Fields: Left visual field deficit;Impaired-to be further tested in functional context Additional Comments: noted large L neglect + field cut throughout tasks. Pt not able to get much past midline. Not able to navigate and motor plan mobility to L sided environment     Perception     Praxis      Pertinent Vitals/Pain Pain Assessment: Faces Faces Pain Scale: Hurts even more Pain Location: posterior skill, neck Pain Descriptors / Indicators: Aching;Sore Pain Intervention(s): Monitored during session;Patient requesting pain meds-RN  notified;Repositioned     Hand Dominance     Extremity/Trunk Assessment Upper Extremity Assessment Upper Extremity Assessment: Generalized weakness;LUE deficits/detail LUE Deficits / Details: inattention; tone; difficulty using L hand functionally or integrating L hand into bimanual tasks LUE Sensation: decreased proprioception LUE Coordination: decreased gross motor;decreased fine motor   Lower Extremity Assessment Lower Extremity Assessment: Defer to PT evaluation       Communication Communication Communication: Expressive difficulties   Cognition Arousal/Alertness: Awake/alert Behavior During Therapy: Impulsive Overall Cognitive Status: Impaired/Different from baseline Area of Impairment: Orientation;Attention;Memory;Following commands;Safety/judgement;Awareness;Problem solving                 Orientation Level: Disoriented to;Situation Current Attention Level: Sustained Memory: Decreased short-term memory;Decreased recall of precautions Following Commands: Follows one step commands with increased time Safety/Judgement: Decreased awareness of safety;Decreased awareness of deficits Awareness: Intellectual (approaching emergent) Problem Solving: Slow processing;Decreased initiation;Difficulty sequencing;Requires verbal cues;Requires tactile cues General Comments: pt unaware why he was in hospital until cued. Noted marked delay in processing time during ADL tasks (OT Took cup from pt and 2-3 mins later pt started moving hand and stating he was giving the cup back to the OT). At time would state nonsensical phrases. Overall very poor safety awareness   General Comments       Exercises     Shoulder Instructions      Home Living Family/patient expects to be discharged to:: Assisted living [facility])                             Home Equipment: Gilford Rile - 2 wheels   Additional Comments: Pt is questionable historian. States he uses RW on daily basis,  has had decr in peripheral vision and leg strength  Prior Functioning/Environment Level of Independence: Needs assistance  Gait / Transfers Assistance Needed: uses RW, has falls ADL's / Homemaking Assistance Needed: assist as needed from ALF staff            OT Problem List: Decreased strength;Impaired vision/perception;Decreased knowledge of use of DME or AE;Decreased knowledge of precautions;Decreased range of motion;Decreased coordination;Decreased activity tolerance;Decreased cognition;Impaired UE functional use;Impaired balance (sitting and/or standing);Decreased safety awareness;Impaired sensation      OT Treatment/Interventions: Self-care/ADL training;Therapeutic exercise;Visual/perceptual remediation/compensation;Patient/family education;Balance training;Neuromuscular education;Energy conservation;Therapeutic activities;DME and/or AE instruction;Cognitive remediation/compensation    OT Goals(Current goals can be found in the care plan section) Acute Rehab OT Goals Patient Stated Goal: be able to urinate OT Goal Formulation: With patient Time For Goal Achievement: 08/19/20 Potential to Achieve Goals: Good  OT Frequency: Min 2X/week   Barriers to D/C:            Co-evaluation PT/OT/SLP Co-Evaluation/Treatment: Yes Reason for Co-Treatment: For patient/therapist safety;To address functional/ADL transfers   OT goals addressed during session: ADL's and self-care;Proper use of Adaptive equipment and DME;Strengthening/ROM      AM-PAC OT "6 Clicks" Daily Activity     Outcome Measure Help from another person eating meals?: A Lot Help from another person taking care of personal grooming?: A Lot Help from another person toileting, which includes using toliet, bedpan, or urinal?: Total Help from another person bathing (including washing, rinsing, drying)?: A Lot Help from another person to put on and taking off regular upper body clothing?: A Lot Help from another person to  put on and taking off regular lower body clothing?: Total 6 Click Score: 10   End of Session Equipment Utilized During Treatment: Gait belt;Rolling walker Nurse Communication: Mobility status  Activity Tolerance: Patient tolerated treatment well Patient left: in bed;with call bell/phone within reach;with chair alarm set (alarm belt)  OT Visit Diagnosis: Unsteadiness on feet (R26.81);Other abnormalities of gait and mobility (R26.89);Muscle weakness (generalized) (M62.81);Other symptoms and signs involving cognitive function;Other symptoms and signs involving the nervous system (R29.898);History of falling (Z91.81);Pain Pain - part of body:  (posterior head)                Time: 5456-2563 OT Time Calculation (min): 38 min Charges:  OT General Charges $OT Visit: 1 Visit OT Evaluation $OT Eval High Complexity: 1 High OT Treatments $Self Care/Home Management : 8-22 mins  Zenovia Jarred, MSOT, OTR/L Acute Rehabilitation Services [facility] Office Number: [phone] Pager: [phone]  Zenovia Jarred 08/05/2020, 12:45 PM

## 2020-08-05 NOTE — Progress Notes (Signed)
Inpatient Rehab Admissions Coordinator Note:   Per therapy recommendations, pt was screened for CIR candidacy by Shann Medal, PT, DPT.  At this time note pt transferring to ICU for precedex.  Will follow from a distance for now.  Please contact me with questions.   Shann Medal, PT, DPT [phone] 08/05/20 4:26 PM

## 2020-08-05 NOTE — Consult Note (Signed)
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  Underwent resection on September 29.  On September 30 developed progressive confusion, combativeness, pulling at tubes and lines.  Fell onto the floor while resting in the intensive care unit in the postoperative setting.  PCCM consulted for progressive change in mental status.  History of present illness   See details above.  This is a 74 year old male who was admitted for 3 seizures that happened outside the hospital.  He has a history of seizure disorder and noted some peripheral vision changes as well as leg weakness which had developed for 2 to 3 weeks prior to admission.  On arrival he was noted to have an occipital lobe brain tumor.  On September 29 he underwent a right parietal occipital craniotomy tumor excision performed by Dr. Duffy Rhody with Dr. Ashok Pall assisting.  He rested overnight in the intensive care unit.  Nursing noted that he had some mild confusion in the morning of September 30.  He was able to get out of bed with assistance from nursing and was in a chair.  However, he was attempting to remove various medical devices including telemetry cables and SCDs and eventually fell out of the chair and struck his head.  He went for an emergent CT scan which showed no change from the most recent postoperative study.  Pulmonary and critical care medicine was consulted in the setting of worsening mental status change and the recent fall.   He reports that he drinks 5-6 drinks of alcohol a day, quit drinking around the time that he came to the hospital.  Of note, the medical record states that he quit drinking approximately 1 year ago.  I have tried  to call his family to find out if he is still actively drinking and I have been unable to get a hold of anyone.  He is currently too confused to provide an accurate history.  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 Seizure disorder  Significant Hospital Events   September 27 admission September 29 right parietal-occipital craniotomy and tumor resection September 30 progressive confusion, combativeness then eventual fall in ICU, PCCM consulted  Consults:  Neurosurgery PCCM  Procedures:  September 29 right parietal-occipital craniotomy and tumor resection  Significant Diagnostic Tests:  September 27 CT head and neck: 4 x 6 cm mass in the right occipital lobe with surrounding edema, mildly hyperdense to cortex, favor glioblastoma, negative cervical spine, extensive atherosclerotic calcification of carotid artery bilaterally September 28 CT chest abdomen pelvis: No acute process or evidence of primary malignancy in chest abdomen or pelvis, tiny hiatal hernia, emphysema, coronary disease, aortic atherosclerosis, aortic valve calcifications September 29 CT head unchanged 6.1 cm mass right occipital lobe, unchanged local mass-effect with 3 mm leftward midline shift September 30 CT head 08:23: Postoperative changes from interval right perioccipital craniotomy and mass resection, small volume hemorrhage along the periphery of the resection and adjacent falx.  Also small to moderate volume intraventricular hemorrhage within the lateral  third ventricles, no evidence of hydrocephalus.  Significant interval tumor debulking.  Apparent residual tumor along the inferior deep aspects of the resection cavity and along the left aspect of the  posterior falx.  Extra-axial fluid collection in the right perioccipital cranioplasty measuring 0.9 cm containing a small amount of acute hemorrhage.  Pneumocephalus along the anterior frontal lobes and within the right ventricular horn September 30 CT head 12:45: postoperative changes again noted, no major changes compared to the study performed earlier in the day  Micro Data:  September 27 SARS-CoV-2 and influenza panel negative September 29 wound/surgical pathology culture  Antimicrobials:  September 29 cefazolin x3 doses  Interim history/subjective:  As above  Objective   Blood pressure (!) 150/67, pulse 80, temperature 99.2 F (37.3 C), temperature source Oral, resp. rate (!) 22, height 5' 9.02" (1.753 m), weight 87 kg, SpO2 96 %.        Intake/Output Summary (Last 24 hours) at 08/05/2020 1434 Last data filed at 08/05/2020 1100 Gross per 24 hour  Intake 1688.15 ml  Output 2375 ml  Net -686.85 ml   Filed Weights   08/02/20 1907 08/04/20 1044  Weight: 87 kg 87 kg    Examination:  General:  Resting  in bed, combative with nursing, pulling at tubes and lines HENT: Craniotomy scar well dressed, OP clear PULM: CTA B, normal effort CV: RRR, no mgr GI: BS+, soft, nontender MSK: normal bulk and tone Neuro: Awake, oriented to date and situation however inattentive in conversation, frequently combative yelling, making nonsensical statements, moves all 4 extremities including pulling at tubes and lines   Resolved Hospital Problem list     Assessment & Plan:  Acute metabolic encephalopathy 1 day postop from large tumor resection from occipital lobe: Differential diagnosis broad and includes ICU delirium, steroid induced delirium, possibly alcohol withdrawal given his extensive alcohol history.  History of alcohol use is inconsistent.  At this time he is clearly a risk to himself as he is already fallen out of bed and is currently attempting to pull off the medical equipment  necessary for monitoring. Admit to ICU Start Precedex in order to minimize disruptive behavior Hold off on benzodiazepines for now Use Haldol as needed for disruptive behavior, pulling at tubes and lines Frequent orientation Try to get further history from family in regard to current alcohol use (they are currently not answering the phone) Thiamine Folate Monitor glucose Monitor for signs of infection  Seizure disorder: Continue Keppra  Occipital brain mass status post resection: Fall with head trauma today, CT head without new changes Neurosurgery following Wound care management per neurosurgery Decadron per neurosurgery  History of hypertension and CHF (diastolic, chronic) Telemetry Continue lisinopril Continue metoprolol  GERD: Continue pantoprazole  History of COPD: Monitor for wheezing Monitor O2 saturation and respiratory status  BPH: Continue Flomax  Hyperglycemia: Sliding scale insulin, monitor glucose Check hemoglobin A1c  Best practice:  Diet: NPO for now, advance 10/1 if improved Pain/Anxiety/Delirium protocol (if indicated): n/a VAP protocol (if indicated): n/a DVT prophylaxis: lovenox> per neurosurgery  GI prophylaxis: n/a Glucose control: as above Mobility: bed rest Code Status: DNR Family Communication: I attempted to call his sister, no answer Disposition:   Labs   CBC: Recent Labs  Lab 08/02/20 1930 08/02/20 1930 08/03/20 0346 08/04/20 0722 08/04/20 1333 08/04/20 1515 08/05/20 0432  WBC 8.3  --  6.4 14.8*  --   --  16.7*  NEUTROABS 6.1  --  5.9  --   --   --  15.6*  HGB 10.4*   < > 10.2* 10.6* 10.2* 10.2* 9.9*  HCT 30.5*   < > 29.2* 30.8* 30.0* 30.0* 30.0*  MCV 88.9  --  87.7 87.0  --   --  88.0  PLT 240  --  228 249  --   --  199   < > = values in this interval not displayed.    Basic Metabolic Panel: Recent Labs  Lab 08/02/20 1930 08/02/20 1930 08/03/20 0346 08/04/20 0722 08/04/20 1333 08/04/20 1515 08/05/20 0432  NA  131*   < > 133* 137 135 135 136  K 3.9   < > 4.4 4.1 3.9 4.2 3.8  CL 97*  --  99 103  --   --  104  CO2 26  --  24 23  --   --  23  GLUCOSE 105*  --  154* 126*  --   --  154*  BUN 26*  --  24* 22  --   --  16  CREATININE 1.61*  --  1.35* 1.22  --   --  0.93  CALCIUM 9.2  --  9.3 9.6  --   --  9.2  MG  --   --  1.5* 1.5*  --   --   --    < > = values in this interval not displayed.   GFR: Estimated Creatinine Clearance: 76.1 mL/min (by C-G formula based on SCr of 0.93 mg/dL). Recent Labs  Lab 08/02/20 1930 08/03/20 0346 08/04/20 0722 08/05/20 0432  WBC 8.3 6.4 14.8* 16.7*    Liver Function Tests: Recent Labs  Lab 08/02/20 1930 08/03/20 0346  AST 21 19  ALT 14 12  ALKPHOS 70 67  BILITOT 0.2* 0.5  PROT 7.2 6.9  ALBUMIN 4.0 3.6   No results for input(s): LIPASE, AMYLASE in the last 168 hours. No results for input(s): AMMONIA in the last 168 hours.  ABG    Component Value Date/Time   PHART 7.462 (H) 08/04/2020 1515   PCO2ART 33.7 08/04/2020 1515   PO2ART 211 (H) 08/04/2020 1515   HCO3 24.1 08/04/2020 1515   TCO2 25 08/04/2020 1515   O2SAT 100.0 08/04/2020 1515     Coagulation Profile: Recent Labs  Lab 08/03/20 0346  INR 1.0    Cardiac Enzymes: No results for input(s): CKTOTAL, CKMB, CKMBINDEX, TROPONINI in the last 168 hours.  HbA1C: Hgb A1c MFr Bld  Date/Time Value Ref Range Status  08/03/2020 12:43 AM 6.9 (H) 4.8 - 5.6 % Final    Comment:    (NOTE) Pre diabetes:          5.7%-6.4%  Diabetes:              >6.4%  Glycemic control for   <7.0% adults with diabetes   06/09/2015 10:14 AM 5.9 (H) <5.7 % Final    Comment:                                                                           According to the ADA Clinical Practice Recommendations for 2011, when HbA1c is used as a screening test:     >=6.5%   Diagnostic of Diabetes Mellitus            (  if abnormal result is confirmed)   5.7-6.4%   Increased risk of developing Diabetes Mellitus     References:Diagnosis and Classification of Diabetes Mellitus,Diabetes JIRC,7893,81(OFBPZ 1):S62-S69 and Standards of Medical Care in         Diabetes - 2011,Diabetes WCHE,5277,82 (Suppl 1):S11-S61.       CBG: Recent Labs  Lab 08/04/20 0521 08/04/20 1021 08/04/20 2316 08/05/20 0503 08/05/20 1132  GLUCAP 139* 141* 134* 156* 167*    Review of Systems:   Cannot obtain due to confusion  Past Medical History  He,  has a past medical history of Anemia, Anginal pain ([place]), Celiac disease, CHF (congestive heart failure) ([place]), COPD (chronic obstructive pulmonary disease) ([place]), GERD (gastroesophageal reflux disease), Heart murmur, History of radiation therapy (10/07/18- 11/04/18), Hypertension, Multiple duodenal ulcers, parotid gland cancer Left, Presence of permanent cardiac pacemaker, Right internal carotid occlusion, Seizures ([place]), Shortness of breath, Symptomatic Bradycardia, and 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   Surgical History    Past Surgical History:  Procedure Laterality Date  . APPLICATION OF CRANIAL NAVIGATION Right 08/04/2020   Procedure: APPLICATION OF CRANIAL NAVIGATION;  Surgeon: Vallarie Mare, MD;  Location: [place] [facility];  Service: Neurosurgery;  Laterality: Right;  . CARDIAC CATHETERIZATION N/A 07/08/2015   Procedure: Left Heart Cath and Coronary Angiography;  Surgeon: Troy Sine, MD;  Location: [place] [facility];  Service: Cardiovascular;  Laterality: N/A;  . CARDIAC CATHETERIZATION N/A 07/08/2015   Procedure: Temporary Pacemaker;  Surgeon: Troy Sine, MD;  Location: [place] [facility];  Service: Cardiovascular;  Laterality: N/A;  . CARDIAC CATHETERIZATION N/A 07/09/2015   Procedure: Temporary Pacemaker;  Surgeon: Troy Sine, MD;  Location: [place] [facility];  Service: Cardiovascular;  Laterality: N/A;  . CRANIOTOMY Right 08/04/2020   Procedure: RIGHT PARIETAL OCCIPITAL CRANIOTOMY TUMOR EXCISION;  Surgeon: Vallarie Mare,  MD;  Location: [facility];  Service: Neurosurgery;  Laterality: Right;  . EP IMPLANTABLE DEVICE N/A 07/09/2015   Procedure: Pacemaker Implant;  Surgeon: Will Meredith Leeds, MD;  Location: [place] [facility];  Service: Cardiovascular;  Laterality: N/A;  . ESOPHAGOGASTRODUODENOSCOPY  07/03/2012   Procedure: ESOPHAGOGASTRODUODENOSCOPY (EGD);  Surgeon: Winfield Cunas., MD;  Location: [facility];  Service: Endoscopy;  Laterality: N/A;  . HEMORRHOID SURGERY    . HERNIA REPAIR    . MOLE REMOVAL    . PAROTIDECTOMY Left 08/21/2018   superficial  . PAROTIDECTOMY Left 08/21/2018   Procedure: LEFT SUPERFICIAL PAROTIDECTOMY;  Surgeon: Helayne Seminole, MD;  Location: [facility];  Service: ENT;  Laterality: Left;     Social History   reports that he has been smoking cigarettes. He has a 12.50 pack-year smoking history. He uses smokeless tobacco. He reports previous alcohol use. He reports that he does not use drugs.   Family History   His family history includes Hodgkin's lymphoma in his father.   Allergies No Known Allergies   Home Medications  Prior to Admission medications   Medication Sig Start Date End Date Taking? Authorizing Provider  allopurinol (ZYLOPRIM) 300 MG tablet TAKE 1 TABLET(300 MG) BY MOUTH DAILY Patient taking differently: Take 300 mg by mouth daily.  05/30/18  Yes Orlena Sheldon, PA-C  alum & mag hydroxide-simeth [facility] 200-200-20 MG/5ML suspension Take 30 mLs by mouth as needed for indigestion or heartburn.   Yes [provider]  atorvastatin (LIPITOR) 20 MG tablet Take 1 tablet (20 mg total) by mouth daily. 11/23/17  Yes [place], Modena Nunnery, MD  colchicine  0.6 MG tablet Take 1 tablet (0.6 mg total) by mouth 2 (two) times daily as needed (gout flare). Take twice daily for 2 weeks, then use as needed for gout flare up 12/04/17  Yes Eugenie Filler, MD  furosemide (LASIX) 20 MG tablet TAKE 1 TABLET BY MOUTH DAILY Patient taking differently: Take 20 mg by mouth daily as  needed for fluid.  01/23/18  Yes [place], Modena Nunnery, MD  guaiFENesin (MUCINEX) 600 MG 12 hr tablet Take 600-1,200 mg by mouth every 12 (twelve) hours as needed for cough.    Yes [provider]  guaiFENesin (ROBITUSSIN) 100 MG/5ML liquid Take 200 mg by mouth every 6 (six) hours as needed for cough.    Yes [provider]  levETIRAcetam (KEPPRA) 750 MG tablet Take 1,500 mg by mouth daily. 02/17/19  Yes [provider]  lisinopril (PRINIVIL,ZESTRIL) 20 MG tablet TAKE 1 TABLET(20 MG) BY MOUTH DAILY Patient taking differently: Take 20 mg by mouth daily.  06/03/18  Yes Dena Billet B, PA-C  loperamide (IMODIUM A-D) 2 MG tablet Take 2 mg by mouth as needed for diarrhea or loose stools.    Yes [provider]  magnesium hydroxide (MILK OF MAGNESIA) 400 MG/5ML suspension Take 30 mLs by mouth at bedtime as needed for mild constipation.   Yes [provider]  metFORMIN (GLUCOPHAGE) 500 MG tablet Take 500 mg by mouth daily with breakfast.   Yes [provider]  metoprolol tartrate (LOPRESSOR) 50 MG tablet Take 1.5 tablets (75 mg total) by mouth 2 (two) times daily. 02/05/18 08/02/20 Yes [place], Will Hassell Done, MD  naproxen sodium (ALEVE) 220 MG tablet Take 220 mg by mouth every 12 (twelve) hours as needed (joint pain).   Yes [provider]  neomycin-bacitracin-polymyxin (NEOSPORIN) ointment Apply 1 application topically as needed for wound care.   Yes [provider]  pantoprazole (PROTONIX) 40 MG tablet TAKE 1 TABLET BY MOUTH TWICE DAILY BEFORE A MEAL Patient taking differently: Take 40 mg by mouth 2 (two) times daily.  05/30/18  Yes Orlena Sheldon, PA-C  tamsulosin (FLOMAX) 0.4 MG CAPS capsule TAKE 1 CAPSULE(0.4 MG) BY MOUTH DAILY Patient taking differently: Take 0.4 mg by mouth daily.  05/30/18  Yes Dena Billet B, PA-C  traMADol (ULTRAM) 50 MG tablet Take 50 mg by mouth 2 (two) times daily. 0800 and 2000 03/19/20  Yes [provider]    vitamin B-12 (CYANOCOBALAMIN) 500 MCG tablet Take 500 mcg by mouth daily.   Yes [provider]  amLODipine (NORVASC) 5 MG tablet TAKE 1 TABLET(5 MG) BY MOUTH DAILY Patient not taking: No sig reported 05/30/18   Dena Billet B, PA-C  clotrimazole-betamethasone (LOTRISONE) cream APPLY EXTERNALLY TO THE AFFECTED AREA TWICE DAILY Patient not taking: No sig reported 03/22/17   Alycia Rossetti, MD  ferrous sulfate 325 (65 FE) MG tablet Take 1 tablet (325 mg total) by mouth daily. Patient not taking: Reported on 04/04/2019 12/04/17   Eugenie Filler, MD  folic acid (FOLVITE) 1 MG tablet Take 1 tablet (1 mg total) by mouth daily. Patient not taking: Reported on 04/04/2019 12/05/17   Eugenie Filler, MD  levofloxacin (LEVAQUIN) 500 MG tablet Take 1 tablet (500 mg total) by mouth daily. Patient not taking: Reported on 09/19/2019 04/04/19   Varney Biles, MD  lidocaine (XYLOCAINE) 2 % solution Patient: Mix 1part 2% viscous lidocaine, 1part H20. Swish & swallow 39m of diluted mixture, 227m before meals and at bedtime, up to QID Patient  not taking: Reported on 04/04/2019 10/21/18   Eppie Gibson, MD  Multiple Vitamin (MULTIVITAMIN WITH MINERALS) TABS tablet Take 1 tablet by mouth daily. Patient not taking: Reported on 08/02/2020 12/05/17   Eugenie Filler, MD  neomycin-colistin-hydrocortisone-thonzonium (CORTISPORIN-TC) 3.01-06-09-0.5 MG/ML OTIC suspension Place 3 drops into the left ear 4 (four) times daily. Patient not taking: Reported on 04/04/2019 11/15/18   Eppie Gibson, MD  nystatin (MYCOSTATIN) 100000 UNIT/ML suspension Take 5 mLs (500,000 Units total) by mouth 4 (four) times daily. Swish and gargle in mouth for a minute, then swallow. This should help thrush in mouth. Use QID until bottle is empty. Patient not taking: Reported on 08/02/2020 10/21/18   Eppie Gibson, MD  polyethylene glycol [facility] / Floria Raveling) packet Take 17 g by mouth 2 (two) times daily. Patient not taking: Reported on  08/02/2020 12/04/17   Eugenie Filler, MD  potassium chloride (K-DUR) 10 MEQ tablet TAKE 1 TABLET(10 MEQ) BY MOUTH DAILY Patient not taking: No sig reported 05/30/18   Orlena Sheldon, PA-C  thiamine 100 MG tablet Take 1 tablet (100 mg total) by mouth daily. Patient not taking: Reported on 04/04/2019 12/05/17   Eugenie Filler, MD     Critical care time: 39 minutes     Roselie Awkward, MD [place] P[facility]M Pager: [phone] Cell: [phone] If no response, call [phone]

## 2020-08-06 DIAGNOSIS — D496 Neoplasm of unspecified behavior of brain: Secondary | ICD-10-CM

## 2020-08-06 DIAGNOSIS — R569 Unspecified convulsions: Secondary | ICD-10-CM | POA: Diagnosis not present

## 2020-08-06 DIAGNOSIS — F1011 Alcohol abuse, in remission: Secondary | ICD-10-CM | POA: Diagnosis not present

## 2020-08-06 LAB — COMPREHENSIVE METABOLIC PANEL
ALT: 15 U/L (ref 0–44)
AST: 24 U/L (ref 15–41)
Albumin: 3.2 g/dL — ABNORMAL LOW (ref 3.5–5.0)
Alkaline Phosphatase: 47 U/L (ref 38–126)
Anion gap: 9 (ref 5–15)
BUN: 20 mg/dL (ref 8–23)
CO2: 24 mmol/L (ref 22–32)
Calcium: 9.3 mg/dL (ref 8.9–10.3)
Chloride: 104 mmol/L (ref 98–111)
Creatinine, Ser: 1.04 mg/dL (ref 0.61–1.24)
GFR calc Af Amer: >60 mL/min (ref >60)
GFR calc non Af Amer: >60 mL/min (ref >60)
Glucose, Bld: 144 mg/dL — ABNORMAL HIGH (ref 70–99)
Potassium: 3.9 mmol/L (ref 3.5–5.1)
Sodium: 137 mmol/L (ref 135–145)
Total Bilirubin: 0.3 mg/dL (ref 0.3–1.2)
Total Protein: 6.1 g/dL — ABNORMAL LOW (ref 6.5–8.1)

## 2020-08-06 LAB — CBC WITH DIFFERENTIAL/PLATELET
Abs Immature Granulocytes: 0.09 10*3/uL — ABNORMAL HIGH (ref 0.00–0.07)
Basophils Absolute: 0 10*3/uL (ref 0.0–0.1)
Basophils Relative: 0 %
Eosinophils Absolute: 0 10*3/uL (ref 0.0–0.5)
Eosinophils Relative: 0 %
HCT: 30.2 % — ABNORMAL LOW (ref 39.0–52.0)
Hemoglobin: 10.5 g/dL — ABNORMAL LOW (ref 13.0–17.0)
Immature Granulocytes: 1 %
Lymphocytes Relative: 3 %
Lymphs Abs: 0.4 10*3/uL — ABNORMAL LOW (ref 0.7–4.0)
MCH: 30.7 pg (ref 26.0–34.0)
MCHC: 34.8 g/dL (ref 30.0–36.0)
MCV: 88.3 fL (ref 80.0–100.0)
Monocytes Absolute: 0.8 10*3/uL (ref 0.1–1.0)
Monocytes Relative: 6 %
Neutro Abs: 12.4 10*3/uL — ABNORMAL HIGH (ref 1.7–7.7)
Neutrophils Relative %: 90 %
Platelets: 194 10*3/uL (ref 150–400)
RBC: 3.42 MIL/uL — ABNORMAL LOW (ref 4.22–5.81)
RDW: 15.1 % (ref 11.5–15.5)
WBC: 13.6 10*3/uL — ABNORMAL HIGH (ref 4.0–10.5)
nRBC: 0.1 % (ref 0.0–0.2)

## 2020-08-06 LAB — GLUCOSE, CAPILLARY
Glucose-Capillary: 149 mg/dL — ABNORMAL HIGH (ref 70–99)
Glucose-Capillary: 155 mg/dL — ABNORMAL HIGH (ref 70–99)
Glucose-Capillary: 164 mg/dL — ABNORMAL HIGH (ref 70–99)
Glucose-Capillary: 166 mg/dL — ABNORMAL HIGH (ref 70–99)

## 2020-08-06 LAB — HEMOGLOBIN A1C
Hgb A1c MFr Bld: 6.4 % — ABNORMAL HIGH (ref 4.8–5.6)
Mean Plasma Glucose: 136.98 mg/dL

## 2020-08-06 NOTE — Progress Notes (Signed)
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  Underwent resection on September 29.  On September 30 developed progressive confusion, combativeness, pulling at tubes and lines.  Fell onto the floor while resting in the intensive care unit in the postoperative setting.  Emergent CT scan showed no acute changes. PCCM consulted for progressive change in mental status.  He reports that he drinks 5-6 drinks of alcohol a day, quit drinking around the time that he came to the hospital.  Of note, the medical record states that he quit drinking approximately 1 year ago.   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 Seizure disorder  Significant Hospital Events   9/27 Admit  9/29 Right Parietal-occipital craniotomy and tumor resection 9/30 progressive confusion, combativeness then eventual fall in ICU, PCCM consulted  Consults:  Neurosurgery PCCM  Procedures:     Significant Diagnostic Tests:  9/27 CT head and neck >> 4 x 6 cm mass in the right occipital lobe with surrounding edema, mildly hyperdense to cortex, favor glioblastoma, negative cervical spine, extensive atherosclerotic calcification of carotid artery bilaterally 9/28 CT chest abdomen pelvis >> No acute process or evidence of primary malignancy in chest abdomen or pelvis, tiny hiatal hernia, emphysema, coronary disease, aortic atherosclerosis, aortic valve calcifications 9/29 CT head >>  unchanged 6.1 cm mass right occipital lobe, unchanged local mass-effect with 3 mm leftward midline shift 9/30 CT head 08:23 >> Postoperative changes from interval right perioccipital craniotomy and mass resection, small volume hemorrhage along the periphery of the resection and adjacent falx.  Also small to moderate volume intraventricular hemorrhage within the lateral third ventricles, no evidence of hydrocephalus.  Significant interval tumor debulking.  Apparent residual tumor along the inferior deep aspects of the resection cavity and along the left aspect of the posterior falx.  Extra-axial fluid collection in the right perioccipital cranioplasty measuring 0.9 cm containing a small amount of acute hemorrhage.  Pneumocephalus along the anterior frontal lobes and within the right ventricular horn 9/30 CT head 12:45 >> postoperative changes again noted, no major changes compared to the study performed earlier in the day  Micro Data:  9/27 SARS-CoV-2 and influenza panel >> negative 9/29 wound/surgical pathology culture >>  Antimicrobials:  9/29 cefazolin x3 doses  Interim history/subjective:  Afebrile  On room air  Glucose range 126-154 I/O 1.2L UOP, -763m in last 24 hours  Pt reports headache in the back of his head.  Working with PT > therapist notes left peripheral vision deficit and left sided neglect at times, unsteady gait   Objective   Blood pressure (!) 112/55, pulse 79, temperature 98.4 F (36.9 C), temperature source Axillary, resp. rate (!) 21, height 5' 9.02" (1.753 m), weight 87 kg, SpO2 95 %.        Intake/Output Summary (Last 24 hours) at 08/06/2020 0954 Last data filed at 08/06/2020 0800 Gross per 24 hour  Intake 372.64 ml  Output 950 ml  Net -577.36 ml   Filed Weights   08/02/20 1907 08/04/20  1044  Weight: 87 kg 87 kg    Examination: General: chronically ill appearing adult male sitting up in chair in NAD, PT working with patient  HEENT: MM pink/moist, on RA,  anicteric  Neuro: Awake, alert, speech clear, oriented to self, place, events of admit.  No evidence of withdrawal.  CV: s1s2 rrr, no m/r/g PULM: non-labored on RA, lungs bilaterally clear  GI: soft, bsx4 active  Extremities: warm/dry, no edema  Skin: scattered superficial abrasions on forearms   Resolved Hospital Problem list     Assessment & Plan:   Acute Metabolic Encephalopathy postop from large tumor resection from occipital lobe Differential diagnosis broad and includes ICU delirium, steroid induced delirium, possibly alcohol withdrawal given his extensive alcohol history.  History of alcohol use is inconsistent and he does not appear to be in active withdrawal.  He is clearly a risk to himself as he fell out of bed 9/30 and was agitated/pulling lines. -monitor in ICU  -discontinue precedex  -hold benzodiazepines for now  -monitor neuro status closely  -PRN haldol  -frequent reorientation, delirium prevention measures  -thiamine, folate  Seizure disorder -continue keppra   Occipital brain mass status post resection Fall with head trauma 9/30, CT head without new changes -post operative & wound care recommendations per NSGY -Decadron per NSGY  -follow up surgical pathology   History of Hypertension and CHF (diastolic, chronic) -tele monitoring  -continue metoprolol, lisinpril   GERD -PPI   History of COPD -monitor for evidence of bronchospasm  -PRN albuterol    BPH -continue flomax   Hyperglycemia Hgb A1C 6.4 -SSI   Best practice:  Diet: diet as tolerated  Pain/Anxiety/Delirium protocol (if indicated): n/a VAP protocol (if indicated): n/a DVT prophylaxis: lovenox> per neurosurgery  GI prophylaxis: n/a Glucose control: as above Mobility: bed rest Code Status: DNR Family Communication: per primary.  Patient updated 10/1 on plan of care Disposition: ICU  Labs   CBC: Recent Labs  Lab 08/02/20 1930 08/02/20 1930 08/03/20 0346 08/03/20 0346  08/04/20 0722 08/04/20 1333 08/04/20 1515 08/05/20 0432 08/06/20 0551  WBC 8.3  --  6.4  --  14.8*  --   --  16.7* 13.6*  NEUTROABS 6.1  --  5.9  --   --   --   --  15.6* 12.4*  HGB 10.4*   < > 10.2*   < > 10.6* 10.2* 10.2* 9.9* 10.5*  HCT 30.5*   < > 29.2*   < > 30.8* 30.0* 30.0* 30.0* 30.2*  MCV 88.9  --  87.7  --  87.0  --   --  88.0 88.3  PLT 240  --  228  --  249  --   --  199 194   < > = values in this interval not displayed.    Basic Metabolic Panel: Recent Labs  Lab 08/02/20 1930 08/02/20 1930 08/03/20 0346 08/03/20 0346 08/04/20 0722 08/04/20 1333 08/04/20 1515 08/05/20 0432 08/06/20 0551  NA 131*   < > 133*   < > 137 135 135 136 137  K 3.9   < > 4.4   < > 4.1 3.9 4.2 3.8 3.9  CL 97*  --  99  --  103  --   --  104 104  CO2 26  --  24  --  23  --   --  23 24  GLUCOSE 105*  --  154*  --  126*  --   --  154* 144*  BUN  26*  --  24*  --  22  --   --  16 20  CREATININE 1.61*  --  1.35*  --  1.22  --   --  0.93 1.04  CALCIUM 9.2  --  9.3  --  9.6  --   --  9.2 9.3  MG  --   --  1.5*  --  1.5*  --   --   --   --    < > = values in this interval not displayed.   GFR: Estimated Creatinine Clearance: 68 mL/min (by C-G formula based on SCr of 1.04 mg/dL). Recent Labs  Lab 08/03/20 0346 08/04/20 0722 08/05/20 0432 08/06/20 0551  WBC 6.4 14.8* 16.7* 13.6*    Liver Function Tests: Recent Labs  Lab 08/02/20 1930 08/03/20 0346 08/06/20 0551  AST 21 19 24   ALT 14 12 15   ALKPHOS 70 67 47  BILITOT 0.2* 0.5 0.3  PROT 7.2 6.9 6.1*  ALBUMIN 4.0 3.6 3.2*   No results for input(s): LIPASE, AMYLASE in the last 168 hours. No results for input(s): AMMONIA in the last 168 hours.  ABG    Component Value Date/Time   PHART 7.462 (H) 08/04/2020 1515   PCO2ART 33.7 08/04/2020 1515   PO2ART 211 (H) 08/04/2020 1515   HCO3 24.1 08/04/2020 1515   TCO2 25 08/04/2020 1515   O2SAT 100.0 08/04/2020 1515     Coagulation Profile: Recent Labs  Lab 08/03/20 0346  INR 1.0     Cardiac Enzymes: No results for input(s): CKTOTAL, CKMB, CKMBINDEX, TROPONINI in the last 168 hours.  HbA1C: Hgb A1c MFr Bld  Date/Time Value Ref Range Status  08/06/2020 05:51 AM 6.4 (H) 4.8 - 5.6 % Final    Comment:    (NOTE) Pre diabetes:          5.7%-6.4%  Diabetes:              >6.4%  Glycemic control for   <7.0% adults with diabetes   08/03/2020 12:43 AM 6.9 (H) 4.8 - 5.6 % Final    Comment:    (NOTE) Pre diabetes:          5.7%-6.4%  Diabetes:              >6.4%  Glycemic control for   <7.0% adults with diabetes     CBG: Recent Labs  Lab 08/05/20 0503 08/05/20 1132 08/05/20 1810 08/05/20 2312 08/06/20 0539  GLUCAP 156* 167* 134* 153* 149*       Critical care time:      Noe Gens, MSN, NP-C, AGACNP-BC [facility] 08/06/2020, 9:55 AM   Please see [email or web address] for pager details.

## 2020-08-06 NOTE — Evaluation (Signed)
Speech Language Pathology Evaluation Patient Details Name: Francisco [name] MRN: [id number] DOB: April 29, 1946 Today's Date: 08/06/2020 Time: 1430-1500 SLP Time Calculation (min) (ACUTE ONLY): 30 min  Problem List:  Patient Active Problem List   Diagnosis Date Noted  . Neoplasm of brain causing mass effect on adjacent structures ([place]) 08/03/2020  . Seizure disorder ([place]) 08/03/2020  . Mixed hyperlipidemia due to type 2 diabetes mellitus ([place]) 08/03/2020  . Type 2 diabetes mellitus without complication, with long-term current use of insulin ([place]) 08/03/2020  . Bilateral carotid artery stenosis 08/03/2020  . Seizure with provoking factor ([place]) 08/02/2020  . Cancer of parotid gland ([place]) 09/25/2018  . 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 Parotid mass 08/21/2018  . Carotid disease, bilateral ([place]) 03/20/2018  . Hyponatremia 12/02/2017  . Falls 12/01/2017  . History of alcohol abuse   . Chest pain 08/14/2016  . Seizure ([place])   . Gout 12/17/2015  . Convulsions ([place]) 08/17/2015  . Gait instability 08/17/2015  . Alcoholism /alcohol abuse 08/17/2015  . Cardiac device in situ, other   . Elevated troponin   . Cardiac arrest ([place])   . Complete heart block ([place])   . Seizures ([place])   . Chronic diastolic CHF (congestive heart failure) ([place])   . HLD (hyperlipidemia)   . Alcohol withdrawal ([place])   . Acute renal failure syndrome ([place])   . Hypokalemia   . Hypomagnesemia   . Absolute anemia   . Hypertrophic cardiomyopathy ([place])   . Diastolic CHF, acute on chronic (HCC)   . Essential hypertension   . Alcohol withdrawal seizure ([place]) 06/29/2015  . 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 Mild diastolic dysfunction 90/30/0923  . Acute pain of right knee 06/29/2015  . Transaminitis 06/29/2015  . Acute renal failure ([place]) 06/29/2015  . High anion gap metabolic acidosis 30/05/6225  . Acute hypokalemia 06/29/2015  . Alcohol abuse, daily use 06/10/2015  .  Hyperglycemia 06/09/2015  . Vitamin D deficiency 06/09/2015  . Hyperlipidemia 06/09/2015  . Bilateral lower extremity edema (chronic) 06/09/2015  . BPH (benign prostatic hypertrophy) 08/13/2014  . Multiple duodenal ulcers 07/03/2012  . Chest pain on exertion 07/02/2012  . GIB (gastrointestinal bleeding) 07/02/2012  . GERD (gastroesophageal reflux disease)   . Heart murmur   . Microcytic anemia 06/14/2012  . Generalized weakness 06/13/2012  . Celiac disease 06/13/2012  . HTN (hypertension) 06/13/2012  . Acute hyponatremia 06/13/2012   Past Medical History:  Past Medical History:  Diagnosis Date  . Anemia   . Anginal pain ([place])    normal coronaries 2016  . Celiac disease   . CHF (congestive heart failure) ([place])   . COPD (chronic obstructive pulmonary disease) ([place])   . GERD (gastroesophageal reflux disease)   . Heart murmur   . History of radiation therapy 10/07/18- 11/04/18   Parotid, left 50 Gy in 20 fractions of 2.5 Gy.   Mar[place] Kitchen Hypertension   . Multiple duodenal ulcers   . parotid gland cancer Left   . Presence of permanent cardiac pacemaker   . Right internal carotid occlusion   . Seizures ([place])   . Shortness of breath   . Symptomatic Bradycardia    a. 07/2015 s/p MDT Advisa L DC PPM (Ser #: [id number]).  . 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 APPLICATION OF CRANIAL NAVIGATION Right 08/04/2020   Procedure: APPLICATION OF CRANIAL NAVIGATION;  Surgeon: Vallarie Mare,  MD;  Location: [facility];  Service: Neurosurgery;  Laterality: Right;  . CARDIAC CATHETERIZATION N/A 07/08/2015   Procedure: Left Heart Cath and Coronary Angiography;  Surgeon: Troy Sine, MD;  Location: [facility];  Service: Cardiovascular;  Laterality: N/A;  . CARDIAC CATHETERIZATION N/A 07/08/2015   Procedure: Temporary Pacemaker;  Surgeon: Troy Sine, MD;  Location: [facility];   Service: Cardiovascular;  Laterality: N/A;  . CARDIAC CATHETERIZATION N/A 07/09/2015   Procedure: Temporary Pacemaker;  Surgeon: Troy Sine, MD;  Location: [facility];  Service: Cardiovascular;  Laterality: N/A;  . CRANIOTOMY Right 08/04/2020   Procedure: RIGHT PARIETAL OCCIPITAL CRANIOTOMY TUMOR EXCISION;  Surgeon: Vallarie Mare, MD;  Location: [facility];  Service: Neurosurgery;  Laterality: Right;  . EP IMPLANTABLE DEVICE N/A 07/09/2015   Procedure: Pacemaker Implant;  Surgeon: Will Meredith Leeds, MD;  Location: [facility];  Service: Cardiovascular;  Laterality: N/A;  . ESOPHAGOGASTRODUODENOSCOPY  07/03/2012   Procedure: ESOPHAGOGASTRODUODENOSCOPY (EGD);  Surgeon: Winfield Cunas., MD;  Location: [facility];  Service: Endoscopy;  Laterality: N/A;  . HEMORRHOID SURGERY    . HERNIA REPAIR    . MOLE REMOVAL    . PAROTIDECTOMY Left 08/21/2018   superficial  . PAROTIDECTOMY Left 08/21/2018   Procedure: LEFT SUPERFICIAL PAROTIDECTOMY;  Surgeon: Helayne Seminole, MD;  [facility];  Service: ENT;  Laterality: Left;   HPI:  74 year old male presented to the ED 9/27 from ALF with a seizure. 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  Dx large 6 cm mass in right parietal lobe and occipital lobe that was also involving the corpus callosum.  There was significant mass-effect. Underwent craniotomy and tumor excision 9/29.   Assessment / Plan / Recommendation Clinical Impression  Pt presents with cognitive deficits marked by disorientation to elements of time and situation, inconsistent difficulty with short-term recall, decreased selective attention and divergent thinking.  When cued to focus on target task and actively eliminate distractions, performance on all tasks improved.  Pt demonstrated good social communication/pragmatics with appropriate use of humor. Speech was clear  with no dysarthria; expressive/receptive language WNL.  Recommend continued SLP while in acute care - he would benefit from the intensity of CIR therapies.      SLP Assessment  SLP Recommendation/Assessment: Patient needs continued Speech Lanaguage Pathology Services SLP Visit Diagnosis: Cognitive communication deficit (R41.841)    Follow Up Recommendations  Inpatient Rehab    Frequency and Duration min 2x/week  2 weeks      SLP Evaluation Cognition  Overall Cognitive Status: Impaired/Different from baseline Arousal/Alertness: Awake/alert Orientation Level: Oriented to person;Oriented to place;Disoriented to time;Disoriented to situation Attention: Selective Selective Attention: Impaired Selective Attention Impairment: Verbal basic Memory: Impaired Memory Impairment: Storage deficit;Retrieval deficit Awareness: Impaired Awareness Impairment: Emergent impairment Executive Function: Reasoning Reasoning: Impaired Reasoning Impairment: Verbal complex Safety/Judgment: Impaired       Comprehension  Auditory Comprehension Overall Auditory Comprehension: Appears within functional limits for tasks assessed Reading Comprehension Reading Status: Not tested    Expression Expression Primary Mode of Expression: Verbal Verbal Expression Overall Verbal Expression: Appears within functional limits for tasks assessed Written Expression Dominant Hand: Right Written Expression: Not tested   Oral / Motor  Oral Motor/Sensory Function Overall Oral Motor/Sensory Function: Mild impairment Facial Symmetry: Abnormal symmetry left;Suspected CN VII (facial) dysfunction Motor Speech Overall Motor Speech: Appears within functional limits for tasks assessed   GO  Glen Kesinger L. Tivis Ringer, [place] CCC/SLP Acute Rehabilitation Services Office number [phone] Pager [phone]  Juan Quam Laurice 08/06/2020, 3:21 PM

## 2020-08-06 NOTE — Progress Notes (Signed)
Subjective: Patient reports minimal headache. Does not recall the events from yesterday afternoon.  Objective: Vital signs in last 24 hours: Temp:  [98.4 F (36.9 C)-99.2 F (37.3 C)] 98.4 F (36.9 C) (10/01 0800) Pulse Rate:  [55-99] 79 (10/01 0800) Resp:  [16-26] 21 (10/01 0800) BP: (108-150)/(45-101) 143/61 (10/01 0800) SpO2:  [71 %-98 %] 95 % (10/01 0800)  Intake/Output from previous day: 09/30 0701 - 10/01 0700 In: 502.6 [P.O.:370; I.V.:82.6; IV Piggyback:50] Out: 1225 [Urine:1225] Intake/Output this shift: Total I/O In: 120 [P.O.:120] Out: -   Eyes open spont, PERRL. EOMI, left visual field cut. Left pronator drift Oriented to person, month, hospital.  .Some cognitive deficits noted.  Speech slowed, mildly dysarthric but edentulous Dressing c/d  Lab Results: Recent Labs    08/05/20 0432 08/06/20 0551  WBC 16.7* 13.6*  HGB 9.9* 10.5*  HCT 30.0* 30.2*  PLT 199 194   BMET Recent Labs    08/05/20 0432 08/06/20 0551  NA 136 137  K 3.8 3.9  CL 104 104  CO2 23 24  GLUCOSE 154* 144*  BUN 16 20  CREATININE 0.93 1.04  CALCIUM 9.2 9.3    Studies/Results: CT HEAD WO CONTRAST  Result Date: 08/05/2020 CLINICAL DATA:  Craniotomy for tumor resection 08/04/2020. Fall today. EXAM: CT HEAD WITHOUT CONTRAST CT CERVICAL SPINE WITHOUT CONTRAST TECHNIQUE: Multidetector CT imaging of the head and cervical spine was performed following the standard protocol without intravenous contrast. Multiplanar CT image reconstructions of the cervical spine were also generated. COMPARISON:  CT head 08/04/2020.  CT cervical spine 08/02/2020 FINDINGS: CT HEAD FINDINGS Brain: Right parietal craniotomy for tumor resection. Large post resection cavity is present filled with fluid and gas as well as a small amount of blood. Postoperative blood in the cavity and surrounding tissues is unchanged from earlier today. Extra-axial fluid collection containing fluid and blood at the resection site has  improved. Intraventricular hemorrhage unchanged. Intraventricular gas unchanged. Mild ventricular enlargement unchanged. Mild pneumocephalus unchanged. Chronic microvascular ischemic changes are present. No acute ischemic infarct. Vascular: Negative for hyperdense vessel Skull: Right parietal craniotomy. Sinuses/Orbits: Paranasal sinuses clear.  Negative orbit Other: None CT CERVICAL SPINE FINDINGS Alignment: Mild anterolisthesis C3-4 and C6-7 unchanged. Skull base and vertebrae: Negative for fracture or mass Soft tissues and spinal canal: No soft tissue mass or hematoma. Pacemaker leads on the left. Diffuse atherosclerotic disease. Extensive atherosclerotic calcification in the carotid artery bilaterally. Disc levels: Multilevel disc degeneration and spurring. Mild facet degeneration. Foraminal encroachment bilaterally due to spurring C4-5, C5-6, C6-7 Upper chest: Small subpleural blebs in the right apex. No acute abnormality. Other: None IMPRESSION: 1. 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 Large post resection cavity filled with fluid and small amount of blood unchanged from earlier today. Extra-axial fluid collection at the resection site is smaller. Intraventricular blood is stable. Pneumocephalus unchanged. No acute injury. 2. No acute cervical spine injury. Electronically Signed   By: Franchot Gallo M.D.   On: 08/05/2020 13:19   CT HEAD WO CONTRAST  Result Date: 08/05/2020 CLINICAL DATA:  Brain/CNS neoplasm, assess treatment response. EXAM: CT HEAD WITHOUT CONTRAST TECHNIQUE: Contiguous axial images were obtained from the base of the skull through the vertex without intravenous contrast. COMPARISON:  Prior head CT examinations 08/04/2020 and earlier. FINDINGS: Brain: Postoperative changes from interval right parietooccipital craniotomy and mass resection. There is small-volume hemorrhage along the periphery of the resection cavity as well as along the right aspect of the adjacent falx and  extending anteriorly within the  interhemispheric fissure. Small to moderate volume intraventricular hemorrhage is also present within the lateral and third ventricles. A 1.6 cm focus of hemorrhage along the medial aspect of the posterior right lateral ventricle may be intraventricular or parenchymal (series 3, image 14). Bifrontal extra-axial pneumocephalus. Pneumocephalus is also present within the anterior right lateral ventricle. There is an extra-axial fluid collection deep to the right parietooccipital cranioplasty containing a small amount of acute hemorrhage measuring 0.9 cm in greatest thickness (series 3, image 21). There is apparent residual tumor along the inferior and deep margins of the resection cavity, as well as along the left aspect of the falx (for instance as seen on series 3, images 15-19). No midline shift. Stable generalized cerebral atrophy and chronic small vessel ischemic disease. Redemonstrated chronic lacunar infarcts within the left basal ganglia and thalamus. Vascular: No hyperdense vessel.  Atherosclerotic calcifications. Skull: Right parietooccipital cranioplasty. Overlying scalp soft tissue swelling/edema and scalp staples. Sinuses/Orbits: Visualized orbits show no acute finding. Left mastoid effusion. IMPRESSION: Postoperative changes from interval right parietooccipital craniotomy and mass resection. This includes small-volume hemorrhage along the periphery of the resection cavity and adjacent falx, also extending anteriorly within the interhemispheric fissure. There is also small-to-moderate volume intraventricular hemorrhage within the lateral and third ventricles. No evidence of hydrocephalus at this time. There has been significant interval tumor debulking. Apparent residual tumor along the inferior and deep aspects of the resection cavity and along the left aspect of the posterior falx. Extra-axial fluid collection deep to the right parietooccipital cranioplasty measuring 0.9  cm containing a small amount of acute hemorrhage. Pneumocephalus along the anterior frontal lobes and within the right lateral ventricle frontal horn. Electronically Signed   By: Kellie Simmering DO   On: 08/05/2020 08:54   CT HEAD WO CONTRAST  Result Date: 08/04/2020 CLINICAL DATA:  Brain mass or lesion; brain lab protocol.  Seizure. EXAM: CT HEAD WITHOUT CONTRAST TECHNIQUE: Contiguous axial images were obtained from the base of the skull through the vertex without intravenous contrast. COMPARISON:  Prior head CT 08/03/2020 and head CT 08/02/2020. FINDINGS: Brain: Unchanged size of a mass centered within the paramedian right parietooccipital lobes. The mass measures 6.1 x 4.7 x 5.1 cm (AP x TV x CC). As before, the mass abuts the right aspect of the falx posteriorly and subtly crosses midline. Also unchanged, the mass encroaches upon the posterior aspect of the right lateral ventricle. Unchanged surrounding edema within the posterior right cerebral white matter and callosal splenium. Unchanged local mass effect. Stable leftward midline shift at the level of the septum pellucidum measuring 3 mm. No evidence of acute intracranial hemorrhage. No extra-axial fluid collection. Stable background mild generalized cerebral atrophy and chronic small vessel ischemic disease. Redemonstrated chronic lacunar infarcts within the left basal ganglia and thalamus. Vascular: No hyperdense vessel.  Atherosclerotic calcifications. Skull: Normal. Negative for fracture or focal lesion. Sinuses/Orbits: Visualized orbits show no acute finding. No significant paranasal sinus disease or mastoid effusion at the imaged levels. IMPRESSION: Unchanged 6.1 cm mass centered within the paramedian right parietooccipital lobes with surrounding edema as detailed above. Please refer to the prior examination of 08/02/2020 for differential considerations. Unchanged local mass effect with 3 mm leftward midline shift. Stable background generalized cerebral  atrophy and chronic small vessel ischemic disease. Electronically Signed   By: Kellie Simmering DO   On: 08/04/2020 10:42   CT CERVICAL SPINE WO CONTRAST  Result Date: 08/05/2020 CLINICAL DATA:  Craniotomy for tumor resection 08/04/2020. Fall today. EXAM: CT HEAD  WITHOUT CONTRAST CT CERVICAL SPINE WITHOUT CONTRAST TECHNIQUE: Multidetector CT imaging of the head and cervical spine was performed following the standard protocol without intravenous contrast. Multiplanar CT image reconstructions of the cervical spine were also generated. COMPARISON:  CT head 08/04/2020.  CT cervical spine 08/02/2020 FINDINGS: CT HEAD FINDINGS Brain: Right parietal craniotomy for tumor resection. Large post resection cavity is present filled with fluid and gas as well as a small amount of blood. Postoperative blood in the cavity and surrounding tissues is unchanged from earlier today. Extra-axial fluid collection containing fluid and blood at the resection site has improved. Intraventricular hemorrhage unchanged. Intraventricular gas unchanged. Mild ventricular enlargement unchanged. Mild pneumocephalus unchanged. Chronic microvascular ischemic changes are present. No acute ischemic infarct. Vascular: Negative for hyperdense vessel Skull: Right parietal craniotomy. Sinuses/Orbits: Paranasal sinuses clear.  Negative orbit Other: None CT CERVICAL SPINE FINDINGS Alignment: Mild anterolisthesis C3-4 and C6-7 unchanged. Skull base and vertebrae: Negative for fracture or mass Soft tissues and spinal canal: No soft tissue mass or hematoma. Pacemaker leads on the left. Diffuse atherosclerotic disease. Extensive atherosclerotic calcification in the carotid artery bilaterally. Disc levels: Multilevel disc degeneration and spurring. Mild facet degeneration. Foraminal encroachment bilaterally due to spurring C4-5, C5-6, C6-7 Upper chest: Small subpleural blebs in the right apex. No acute abnormality. Other: None IMPRESSION: 1. Postop resection of  right occipital parietal tumor. Large post resection cavity filled with fluid and small amount of blood unchanged from earlier today. Extra-axial fluid collection at the resection site is smaller. Intraventricular blood is stable. Pneumocephalus unchanged. No acute injury. 2. No acute cervical spine injury. Electronically Signed   By: Franchot Gallo M.D.   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 Cont Keppra 1,000 mg BID.   - patient denies recent heavy drinking - awaiting path   Vallarie Mare 08/06/2020, 9:07 AM

## 2020-08-06 NOTE — Progress Notes (Addendum)
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 Pt also reports progressive LE weakness with multiple falls over the past weeks as well as vision changes. CT head reveals new R occipital lesion with surrounding edema and evidence of midline shift. Pt underwent R parietal occipital craniotomy and tumor excision on 08/04/2020.    PT Comments    Pt tolerates treatment well, continuing to demonstrate significant L neglect and visual field cut. Pt is aware of field cut but does demonstrate impaired awareness and control of LUE. Pt with significant balance deviations requiring maxA to correct one LOB during session. Pt with improved transfer quality when turning toward R side. PT continues to recommend CIR placement at this time.   Follow Up Recommendations  CIR     Equipment Recommendations  Wheelchair (measurements PT);Wheelchair cushion (measurements PT)    Recommendations for Other Services       Precautions / Restrictions Precautions Precautions: Fall Precaution Comments: Impuslive; fall out of recliner during admission; large L neglect/field cut Restrictions Weight Bearing Restrictions: No    Mobility  Bed Mobility Overal bed mobility: Needs Assistance Bed Mobility: Supine to Sit     Supine to sit: Min assist     General bed mobility comments: cues to initiate  Transfers Overall transfer level: Needs assistance Equipment used: Rolling walker (2  wheeled) Transfers: Sit to/from [facility] Sit to Stand: Mod assist Stand pivot transfers: Mod assist;Max assist       General transfer comment: pt performs 3 sit to stands and SPTs from bed to recliner to [facility]. Pt requires assistance for impaired balance with one significant LOB requiring maxA to correct. Pt with impaired L attention and vision, improved transfer quality to right side  Ambulation/Gait Ambulation/Gait assistance: Mod assist Gait Distance (Feet): 3 Feet Assistive device: Rolling walker (2 wheeled) Gait Pattern/deviations: Step-to pattern;Shuffle Gait velocity: reduced Gait velocity interpretation: <1.8 ft/sec, indicate of risk for recurrent falls General Gait Details: pt with impaired control of LUE to utilize RW, requires PT tactile and verbal cues to facilitate direction and manage RW due to impaired vision   Stairs             Wheelchair Mobility    Modified Rankin (Stroke Patients Only)       Balance Overall balance assessment: Needs assistance Sitting-balance support: Feet supported;Single extremity supported Sitting balance-Leahy Scale: Poor Sitting balance - Comments: reliant on unilateral UE support and minA   Standing balance support: Bilateral upper extremity supported Standing balance-Leahy Scale: Poor Standing balance comment: min-modA with BUE support of RW                            Cognition Arousal/Alertness: Awake/alert Behavior During Therapy: Impulsive Overall Cognitive Status: Impaired/Different from baseline Area of Impairment: Orientation;Attention;Memory;Following commands;Safety/judgement;Awareness;Problem solving                 Orientation Level: Disoriented to;Time (pt reports guessing that it was october) Current Attention Level: Sustained Memory: Decreased short-term memory;Decreased recall of precautions Following Commands: Follows one step commands  with increased  time Safety/Judgement: Decreased awareness of safety;Decreased awareness of deficits Awareness: Emergent Problem Solving: Slow processing;Requires verbal cues;Difficulty sequencing;Requires tactile cues        Exercises      General Comments General comments (skin integrity, edema, etc.): VSS on RA      Pertinent Vitals/Pain Pain Assessment: Faces Faces Pain Scale: Hurts little more Pain Location: head Pain Descriptors / Indicators: Aching Pain Intervention(s): Monitored during session    Home Living                      Prior Function            PT Goals (current goals can now be found in the care plan section) Acute Rehab PT Goals Patient Stated Goal: to improve balance Progress towards PT goals: Progressing toward goals    Frequency    Min 4X/week      PT Plan Current plan remains appropriate    Co-evaluation              AM-PAC PT "6 Clicks" Mobility   Outcome Measure  Help needed turning from your back to your side while in a flat bed without using bedrails?: A Lot Help needed moving from lying on your back to sitting on the side of a flat bed without using bedrails?: A Lot Help needed moving to and from a bed to a chair (including a wheelchair)?: A Lot Help needed standing up from a chair using your arms (e.g., wheelchair or bedside chair)?: A Lot Help needed to walk in hospital room?: A Lot Help needed climbing 3-5 steps with a railing? : Total 6 Click Score: 11    End of Session Equipment Utilized During Treatment: Gait belt Activity Tolerance: Patient tolerated treatment well Patient left: in chair;with call bell/phone within reach;with chair alarm set;with restraints reapplied Nurse Communication: Mobility status PT Visit Diagnosis: Unsteadiness on feet (R26.81);Other abnormalities of gait and mobility (R26.89);Muscle weakness (generalized) (M62.81);Repeated falls (R29.6);Other symptoms and signs involving the nervous system  (R29.898)     Time: 1427-6701 PT Time Calculation (min) (ACUTE ONLY): 44 min  Charges:  $Gait Training: 8-22 mins $Therapeutic Activity: 23-37 mins                     Zenaida Niece, PT, DPT Acute Rehabilitation Pager: [phone]    Zenaida Niece 08/06/2020, 10:41 AM

## 2020-08-06 NOTE — Progress Notes (Signed)
Inpatient Rehab Admissions Coordinator:   At this time we are recommending CIR consult.  If pt would like to be considered for possible rehab admission, please place an order for IP Rehab MD Consult.   Shann Medal, PT, DPT Admissions Coordinator [phone] 08/06/20  2:16 PM

## 2020-08-07 DIAGNOSIS — I10 Essential (primary) hypertension: Secondary | ICD-10-CM | POA: Diagnosis not present

## 2020-08-07 DIAGNOSIS — K900 Celiac disease: Secondary | ICD-10-CM | POA: Diagnosis not present

## 2020-08-07 DIAGNOSIS — D496 Neoplasm of unspecified behavior of brain: Secondary | ICD-10-CM | POA: Diagnosis not present

## 2020-08-07 DIAGNOSIS — I5032 Chronic diastolic (congestive) heart failure: Secondary | ICD-10-CM | POA: Diagnosis not present

## 2020-08-07 LAB — BASIC METABOLIC PANEL
Anion gap: 9 (ref 5–15)
BUN: 24 mg/dL — ABNORMAL HIGH (ref 8–23)
CO2: 25 mmol/L (ref 22–32)
Calcium: 9.1 mg/dL (ref 8.9–10.3)
Chloride: 102 mmol/L (ref 98–111)
Creatinine, Ser: 1.06 mg/dL (ref 0.61–1.24)
GFR calc Af Amer: >60 mL/min (ref >60)
GFR calc non Af Amer: >60 mL/min (ref >60)
Glucose, Bld: 149 mg/dL — ABNORMAL HIGH (ref 70–99)
Potassium: 3.5 mmol/L (ref 3.5–5.1)
Sodium: 136 mmol/L (ref 135–145)

## 2020-08-07 LAB — CBC
HCT: 29.7 % — ABNORMAL LOW (ref 39.0–52.0)
Hemoglobin: 10.4 g/dL — ABNORMAL LOW (ref 13.0–17.0)
MCH: 30.9 pg (ref 26.0–34.0)
MCHC: 35 g/dL (ref 30.0–36.0)
MCV: 88.1 fL (ref 80.0–100.0)
Platelets: 172 10*3/uL (ref 150–400)
RBC: 3.37 MIL/uL — ABNORMAL LOW (ref 4.22–5.81)
RDW: 15.2 % (ref 11.5–15.5)
WBC: 15.4 10*3/uL — ABNORMAL HIGH (ref 4.0–10.5)
nRBC: 0.1 % (ref 0.0–0.2)

## 2020-08-07 LAB — GLUCOSE, CAPILLARY
Glucose-Capillary: 150 mg/dL — ABNORMAL HIGH (ref 70–99)
Glucose-Capillary: 134 mg/dL — ABNORMAL HIGH (ref 70–99)
Glucose-Capillary: 120 mg/dL — ABNORMAL HIGH (ref 70–99)

## 2020-08-07 NOTE — Progress Notes (Addendum)
PROGRESS NOTE    Francisco [name]   YFV:[id number]  DOB: 04-Mar-1946  DOA: 08/02/2020 PCP: Lesia Hausen, PA   Brief Narrative:  Francisco [name] 74 year old male was at [facility] assisted living with past medical history of seizure disorder, alcohol abuse (in remission), hypertension, hyperlipidemia, celiac disease, diastolic congestive heart failure (EF 55-60% via Echo 2016), history of [place] parotid tumor (excised/radiated 08/2018), bilateral carotid stenosis, gout who presents to [facility] emergency department from his assisted living facility due to witnessed seizures. He had 3 seizures after which EMS was called and he was brought to the hospital. Upon eval in the ED he was found to have a right occipital lobe lesion measuring 6 cm x 4 cm with surrounding edema and evidence of midline shift to the left.  Neurosurgery recommended initiating Decadron.  Neurology recommended a IV Keppra bolus The patient was admitted to the triad hospitalist service. After further history was obtained it was determined that the patient was having weakness and frequent falls over the past couple of weeks.  Also noted was peripheral vision loss.  Subjective: Appears confused and is playing with his telemetry wires. Does not know where he is. Has spilled food on himself and the spoon is sitting in his lap. He has not complaints.     Assessment & Plan:   Principal Problem:   Seizure disorder- provoked seizures likely related to occipital mass -Due to incompatible pacemaker, MRIs cannot be obtained - 9/29- Right Parietal-occipital craniotomy and tumor resection - cont Keppra 1000 mg BID    Active Problems:  Acute encephalopathy - occurring after surgery- he fell out of bed that night - ? If he is in alcohol withdrawal- history suggesting alcohol abuse  - ? If delirium related to steroids, anaesthesia or ICU stay - ? 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  Consultants:   NS Procedures:   9/29- Right Parietal-occipital craniotomy and tumor resection  Antimicrobials:  Anti-infectives (From admission, onward)   Start     Dose/Rate Route Frequency Ordered Stop   08/04/20 1930  ceFAZolin (ANCEF) IVPB 1 g/50 mL premix        1 g 100 mL/hr over 30 Minutes Intravenous Every 8 hours 08/04/20 1921 08/05/20 1333   08/04/20 0800  ceFAZolin (ANCEF) IVPB 2g/100 mL premix        2 g 200 mL/hr over 30 Minutes Intravenous To [facility]Stay [facility] 08/04/20 0713 08/04/20 1237  Objective: Vitals:   08/07/20 0900 08/07/20 1000 08/07/20 1100 08/07/20 1246  BP:  (!) 144/61 (!) 122/110 (!) 143/128  Pulse:  72  65  Resp: (!) 23 18 14 19   Temp:    98.7 F (37.1 C)  TempSrc:      SpO2:  96%  99%  Weight:      Height:        Intake/Output Summary (Last 24 hours) at 08/07/2020 1638 Last data filed at 08/06/2020 2000 Gross per 24 hour  Intake 240 ml  Output 715 ml  Net -475 ml   Filed Weights   08/02/20 1907 08/04/20 1044  Weight: 87 kg 87 kg    Examination: General exam: Appears comfortable  HEENT: PERRLA, oral mucosa moist, no sclera icterus or thrush Respiratory  system: Clear to auscultation. Respiratory effort normal. Cardiovascular system: S1 & S2 heard, RRR.   Gastrointestinal system: Abdomen soft, non-tender, nondistended. Normal bowel sounds. Central nervous system: Alert and oriented only to person. No focal neurological deficits. Extremities: No cyanosis, clubbing or edema Skin: No rashes or ulcers- dressing not opened    Data Reviewed: I have personally reviewed following labs and imaging studies  CBC: Recent Labs  Lab 08/02/20 1930 08/02/20 1930 08/03/20 0346 08/03/20 0346 08/04/20 0722 08/04/20 0722 08/04/20 1333 08/04/20 1515 08/05/20 0432 08/06/20 0551 08/07/20 0415  WBC 8.3   < > 6.4  --  14.8*  --   --   --  16.7* 13.6* 15.4*  NEUTROABS 6.1  --  5.9  --   --   --   --   --  15.6* 12.4*  --   HGB 10.4*   < > 10.2*   < > 10.6*   < > 10.2* 10.2* 9.9* 10.5* 10.4*  HCT 30.5*   < > 29.2*   < > 30.8*   < > 30.0* 30.0* 30.0* 30.2* 29.7*  MCV 88.9   < > 87.7  --  87.0  --   --   --  88.0 88.3 88.1  PLT 240   < > 228  --  249  --   --   --  199 194 172   < > = values in this interval not displayed.   Basic Metabolic Panel: Recent Labs  Lab 08/03/20 0346 08/03/20 0346 08/04/20 0722 08/04/20 0722 08/04/20 1333 08/04/20 1515 08/05/20 0432 08/06/20 0551 08/07/20 0415  NA 133*   < > 137   < > 135 135 136 137 136  K 4.4   < > 4.1   < > 3.9 4.2 3.8 3.9 3.5  CL 99  --  103  --   --   --  104 104 102  CO2 24  --  23  --   --   --  23 24 25   GLUCOSE 154*  --  126*  --   --   --  154* 144* 149*  BUN 24*  --  22  --   --   --  16 20 24*  CREATININE 1.35*  --  1.22  --   --   --  0.93 1.04 1.06  CALCIUM 9.3  --  9.6  --   --   --  9.2 9.3 9.1  MG 1.5*  --  1.5*  --   --   --   --   --   --    < > = values in this interval not displayed.   GFR: Estimated  Creatinine Clearance: 66.8 mL/min (by C-G formula based on SCr of 1.06 mg/dL). Liver Function Tests: Recent Labs  Lab 08/02/20 1930 08/03/20 0346 08/06/20 0551  AST 21 19  24   ALT 14 12 15   ALKPHOS 70 67 47  BILITOT 0.2* 0.5 0.3  PROT 7.2 6.9 6.1*  ALBUMIN 4.0 3.6 3.2*   No results for input(s): LIPASE, AMYLASE in the last 168 hours. No results for input(s): AMMONIA in the last 168 hours. Coagulation Profile: Recent Labs  Lab 08/03/20 0346  INR 1.0   Cardiac Enzymes: No results for input(s): CKTOTAL, CKMB, CKMBINDEX, TROPONINI in the last 168 hours. BNP (last 3 results) No results for input(s): PROBNP in the last 8760 hours. HbA1C: Recent Labs    08/06/20 0551  HGBA1C 6.4*   CBG: Recent Labs  Lab 08/06/20 1208 08/06/20 1732 08/06/20 2305 08/07/20 0538 08/07/20 1202  GLUCAP 155* 164* 166* 150* 134*   Lipid Profile: No results for input(s): CHOL, HDL, LDLCALC, TRIG, CHOLHDL, LDLDIRECT in the last 72 hours. Thyroid Function Tests: No results for input(s): TSH, T4TOTAL, FREET4, T3FREE, THYROIDAB in the last 72 hours. Anemia Panel: No results for input(s): VITAMINB12, FOLATE, FERRITIN, TIBC, IRON, RETICCTPCT in the last 72 hours. Urine analysis:    Component Value Date/Time   COLORURINE YELLOW 08/02/2020 [place] 08/02/2020 2208   LABSPEC 1.009 08/02/2020 2208   PHURINE 5.0 08/02/2020 2208   GLUCOSEU NEGATIVE 08/02/2020 2208   HGBUR NEGATIVE 08/02/2020 2208   BILIRUBINUR NEGATIVE 08/02/2020 2208   KETONESUR NEGATIVE 08/02/2020 2208   PROTEINUR NEGATIVE 08/02/2020 2208   UROBILINOGEN 0.2 08/13/2014 1010   NITRITE NEGATIVE 08/02/2020 2208   LEUKOCYTESUR NEGATIVE 08/02/2020 2208   Sepsis Labs: @LABRCNTIP (procalcitonin:4,lacticidven:4) ) Recent Results (from the past 240 hour(s))  Respiratory Panel by RT PCR (Flu A&B, Covid) - Urine, Clean Catch     Status: None   Collection Time: 08/02/20 10:08 PM   Specimen: Urine, Clean Catch; Nasopharyngeal  Result Value Ref Range Status   SARS Coronavirus 2 by RT PCR NEGATIVE NEGATIVE Final    Comment: (NOTE) SARS-CoV-2 target nucleic acids are NOT DETECTED.  The  SARS-CoV-2 RNA is generally detectable in upper respiratoy specimens during the acute phase of infection. The lowest concentration of SARS-CoV-2 viral copies this assay can detect is 131 copies/mL. A negative result does not preclude SARS-Cov-2 infection and should not be used as the sole basis for treatment or other patient management decisions. A negative result may occur with  improper specimen collection/handling, submission of specimen other than nasopharyngeal swab, presence of viral mutation(s) within the areas targeted by this assay, and inadequate number of viral copies (<131 copies/mL). A negative result must be combined with clinical observations, patient history, and epidemiological information. The expected result is Negative.  Fact Sheet for Patients:  PinkCheek.be  Fact Sheet for Healthcare Providers:  GravelBags.it  This test is no t yet approved or cleared by the [place] FDA and  has been authorized for detection and/or diagnosis of SARS-CoV-2 by FDA under an Emergency Use Authorization (EUA). This EUA will remain  in effect (meaning this test can be used) for the duration of the COVID-19 declaration under Section 564(b)(1) of the Act, 21 U.S.C. section 360bbb-3(b)(1), unless the authorization is terminated or revoked sooner.     Influenza A by PCR NEGATIVE NEGATIVE Final   Influenza B by PCR NEGATIVE NEGATIVE Final    Comment: (NOTE) The Xpert Xpress SARS-CoV-2/FLU/RSV assay is intended as an aid in  the diagnosis of influenza from Nasopharyngeal swab specimens and  should not be used as a sole basis for treatment. Nasal washings and  aspirates are unacceptable for Xpert Xpress SARS-CoV-2/FLU/RSV  testing.  Fact Sheet for Patients: PinkCheek.be  Fact Sheet for Healthcare Providers: GravelBags.it  This test is not yet approved or cleared  by the [place] FDA and  has been authorized for detection and/or diagnosis of SARS-CoV-2 by  FDA under an Emergency Use Authorization (EUA). This EUA will remain  in effect (meaning this test can be used) for the duration of the  Covid-19 declaration under Section 564(b)(1) of the Act, 21  U.S.C. section 360bbb-3(b)(1), unless the authorization is  terminated or revoked. Performed at [facility], [place] [address] [place], [place] [address]   MRSA PCR Screening     Status: None   Collection Time: 08/03/20  6:09 AM   Specimen: Nasal Mucosa; Nasopharyngeal  Result Value Ref Range Status   MRSA by PCR NEGATIVE NEGATIVE Final    Comment:        The GeneXpert MRSA Assay (FDA approved for NASAL specimens only), is one component of a comprehensive MRSA colonization surveillance program. It is not intended to diagnose MRSA infection nor to guide or monitor treatment for MRSA infections. Performed at [facility], [place] [address], [place], [place] [address]   Surgical pcr screen     Status: None   Collection Time: 08/04/20  7:44 AM   Specimen: Nasal Mucosa; Nasal Swab  Result Value Ref Range Status   MRSA, PCR NEGATIVE NEGATIVE Final   Staphylococcus aureus NEGATIVE NEGATIVE Final    Comment: (NOTE) The Xpert SA Assay (FDA approved for NASAL specimens in patients 48 years of age and older), is one component of a comprehensive surveillance program. It is not intended to diagnose infection nor to guide or monitor treatment. Performed at [facility], [place] [address], [place], [place] [address]   Aerobic/Anaerobic Culture (surgical/deep wound)     Status: None (Preliminary result)   Collection Time: 08/04/20  2:03 PM   Specimen: PATH Other; Tissue  Result Value Ref Range Status   Specimen Description TISSUE  Final   Special Requests RIGHT PARIETAL OCCIPITAL MASS SPEC A  Final   Gram Stain   Final    FEW WBC PRESENT, PREDOMINANTLY MONONUCLEAR NO  ORGANISMS SEEN    Culture   Final    NO GROWTH 3 DAYS NO ANAEROBES ISOLATED; CULTURE IN PROGRESS FOR 5 DAYS Performed at [facility], [place] [address], [place], [place] [address]    Report Status PENDING  Incomplete         Radiology Studies: No results found.    Scheduled Meds: . allopurinol  300 mg Oral Daily  . atorvastatin  20 mg Oral Daily  . Chlorhexidine Gluconate Cloth  6 each Topical Daily  . dexamethasone  4 mg Oral Q8H   Followed by  . [START ON 08/09/2020] dexamethasone  2 mg Oral Q8H   Followed by  . [START ON 08/11/2020] dexamethasone  2 mg Oral Q12H   Followed by  . [START ON 08/13/2020] dexamethasone  1 mg Oral Q12H   Followed by  . [START ON 08/15/2020] dexamethasone  1 mg Oral Daily  . docusate sodium  100 mg Oral BID  . enoxaparin (LOVENOX) injection  40 mg Subcutaneous Q24H  . folic acid  1 mg Oral Daily  . insulin aspart  0-15 Units Subcutaneous Q6H  .  levETIRAcetam  1,000 mg Oral BID  . lisinopril  20 mg Oral Daily  . metoprolol tartrate  75 mg Oral BID  . nicotine  7 mg Transdermal Daily  . pantoprazole  40 mg Oral BID AC  . tamsulosin  0.4 mg Oral Daily  . thiamine  100 mg Oral Daily  . vitamin B-12  500 mcg Oral Daily   Continuous Infusions:    LOS: 4 days      Debbe Odea, MD [facility] Pager: [email or web address] 08/07/2020, 4:38 PM

## 2020-08-07 NOTE — Progress Notes (Signed)
  NEUROSURGERY PROGRESS NOTE   Pt seen and examined. No issues overnight. Minimal HA, no complaints.  EXAM: Temp:  [98 F (36.7 C)-99.6 F (37.6 C)] 99.5 F (37.5 C) (10/02 0800) Pulse Rate:  [58-157] 80 (10/02 0800) Resp:  [13-28] 23 (10/02 0900) BP: (100-161)/(53-75) 128/54 (10/02 0800) SpO2:  [91 %-100 %] 99 % (10/02 0800) Intake/Output      10/01 0701 - 10/02 0700 10/02 0701 - 10/03 0700   P.O. 480    I.V. (mL/kg)     IV Piggyback     Total Intake(mL/kg) 480 (5.5)    Urine (mL/kg/hr) 715 (0.3)    Total Output 715    Net -235         Urine Occurrence 4 x     Awake, alert, oriented Speech dysarthric but fluent, appropriate CN grossly intact MAE well  LABS: Lab Results  Component Value Date   CREATININE 1.06 08/07/2020   BUN 24 (H) 08/07/2020   NA 136 08/07/2020   K 3.5 08/07/2020   CL 102 08/07/2020   CO2 25 08/07/2020   Lab Results  Component Value Date   WBC 15.4 (H) 08/07/2020   HGB 10.4 (L) 08/07/2020   HCT 29.7 (L) 08/07/2020   MCV 88.1 08/07/2020   PLT 172 08/07/2020    IMPRESSION: - 74 y.o. male POD#3 s/p resection right parieto-occipital mass, doing well  PLAN: - Cont supportive care - Cont PT/OT, will likely need CIR - Transfer to stepdown - Cont dex taper and Keppra

## 2020-08-07 NOTE — NC FL2 (Signed)
[place] LEVEL OF CARE SCREENING TOOL     IDENTIFICATION  Patient Name: Francisco [name] Birthdate: [birth year] Sex: male Admission Date (Current Location): 08/02/2020  [facility] and [place] Number:  Herbalist and Address:  [facility], [place] [address], [place], [place] [address]      Provider Number: [id number]  Attending Physician Name and Address:  Debbe Odea, MD  Relative Name and Phone Number:  [phone]    Current Level of Care: Hospital Recommended Level of Care: [place] Prior Approval Number:    Date Approved/Denied:   PASRR Number: [id number]  Discharge Plan: SNF    Current Diagnoses: Patient Active Problem List   Diagnosis Date Noted  . Neoplasm of brain causing mass effect on adjacent structures ([place]) 08/03/2020  . Seizure disorder ([place]) 08/03/2020  . Mixed hyperlipidemia due to type 2 diabetes mellitus ([place]) 08/03/2020  . Type 2 diabetes mellitus without complication, with long-term current use of insulin ([place]) 08/03/2020  . Bilateral carotid artery stenosis 08/03/2020  . Seizure with provoking factor ([place]) 08/02/2020  . Cancer of parotid gland ([place]) 09/25/2018  . 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 Parotid mass 08/21/2018  . Carotid disease, bilateral ([place]) 03/20/2018  . Hyponatremia 12/02/2017  . Falls 12/01/2017  . History of alcohol abuse   . Chest pain 08/14/2016  . Seizure ([place])   . Gout 12/17/2015  . Convulsions ([place]) 08/17/2015  . Gait instability 08/17/2015  . Alcoholism /alcohol abuse 08/17/2015  . Cardiac device in situ, other   . Elevated troponin   . Cardiac arrest ([place])   . Complete heart block ([place])   . Seizures ([place])   . Chronic diastolic CHF (congestive heart failure) ([place])   . HLD (hyperlipidemia)   . Alcohol withdrawal ([place])   . Acute renal failure syndrome ([place])   . Hypokalemia   . Hypomagnesemia   . Absolute anemia   .  Hypertrophic cardiomyopathy ([place])   . Diastolic CHF, acute on chronic (HCC)   . Essential hypertension   . Alcohol withdrawal seizure ([place]) 06/29/2015  . 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 Mild diastolic dysfunction 24/58/0998  . Acute pain of right knee 06/29/2015  . Transaminitis 06/29/2015  . Acute renal failure ([place]) 06/29/2015  . High anion gap metabolic acidosis 33/82/5053  . Acute hypokalemia 06/29/2015  . Alcohol abuse, daily use 06/10/2015  . Hyperglycemia 06/09/2015  . Vitamin D deficiency 06/09/2015  . Hyperlipidemia 06/09/2015  . Bilateral lower extremity edema (chronic) 06/09/2015  . BPH (benign prostatic hypertrophy) 08/13/2014  . Multiple duodenal ulcers 07/03/2012  . Chest pain on exertion 07/02/2012  . GIB (gastrointestinal bleeding) 07/02/2012  . GERD (gastroesophageal reflux disease)   . Heart murmur   . Microcytic anemia 06/14/2012  . Generalized weakness 06/13/2012  . Celiac disease 06/13/2012  . HTN (hypertension) 06/13/2012  . Acute hyponatremia 06/13/2012    Orientation RESPIRATION BLADDER Height & Weight     Self, Place  Normal Incontinent, External catheter Weight: 191 lb 12.8 oz (87 kg) Height:  5' 9.02" (175.3 cm)  BEHAVIORAL SYMPTOMS/MOOD NEUROLOGICAL BOWEL NUTRITION STATUS    Convulsions/Seizures Continent Diet (Thin liquids)  AMBULATORY STATUS COMMUNICATION OF NEEDS Skin   Extensive Assist Verbally                         Personal Care Assistance Level of Assistance  Bathing, Dressing (moderate Assistance) Bathing Assistance:  Maximum assistance   Dressing Assistance: Maximum assistance     Functional Limitations Info  Speech     Speech Info: Impaired (Cognitive communication deficit)    SPECIAL CARE FACTORS FREQUENCY  PT (By licensed PT), OT (By licensed OT), Speech therapy     PT Frequency: 5x OT Frequency: 5x     Speech Therapy Frequency: 2x      Contractures Contractures Info: Not  present    Additional Factors Info  Code Status, Allergies Code Status Info: DNR Allergies Info: NKDA           Current Medications (08/07/2020):  This is the current hospital active medication list Current Facility-Administered Medications  Medication Dose Route Frequency Provider Last Rate Last Admin  . acetaminophen (TYLENOL) tablet 650 mg  650 mg Oral Q6H PRN Vallarie Mare, MD   650 mg at 08/06/20 0543   Or  . acetaminophen (TYLENOL) suppository 650 mg  650 mg Rectal Q6H PRN Vallarie Mare, MD      . allopurinol (ZYLOPRIM) tablet 300 mg  300 mg Oral Daily Vallarie Mare, MD   300 mg at 08/07/20 0950  . alum & mag hydroxide-simeth (MAALOX/MYLANTA) 200-200-20 MG/5ML suspension 30 mL  30 mL Oral PRN Vallarie Mare, MD      . atorvastatin (LIPITOR) tablet 20 mg  20 mg Oral Daily Vallarie Mare, MD   20 mg at 08/07/20 0950  . Chlorhexidine Gluconate Cloth 2 % PADS 6 each  6 each Topical Daily Debbe Odea, MD   6 each at 08/06/20 2131  . dexamethasone (DECADRON) tablet 4 mg  4 mg Oral Q6H Vallarie Mare, MD   4 mg at 08/07/20 0546   Followed by  . dexamethasone (DECADRON) tablet 4 mg  4 mg Oral Q8H Vallarie Mare, MD       Followed by  . [START ON 08/09/2020] dexamethasone (DECADRON) tablet 2 mg  2 mg Oral Q8H Vallarie Mare, MD       Followed by  . [START ON 08/11/2020] dexamethasone (DECADRON) tablet 2 mg  2 mg Oral Q12H Vallarie Mare, MD       Followed by  . [START ON 08/13/2020] dexamethasone (DECADRON) tablet 1 mg  1 mg Oral Q12H Vallarie Mare, MD       Followed by  . [START ON 08/15/2020] dexamethasone (DECADRON) tablet 1 mg  1 mg Oral Daily Vallarie Mare, MD      . docusate sodium (COLACE) capsule 100 mg  100 mg Oral BID Vallarie Mare, MD   100 mg at 08/07/20 0949  . enoxaparin (LOVENOX) injection 40 mg  40 mg Subcutaneous Q24H Debbe Odea, MD   40 mg at 08/07/20 0747  . folic acid (FOLVITE) tablet 1 mg  1 mg Oral Daily  Vallarie Mare, MD   1 mg at 08/07/20 0950  . guaiFENesin (MUCINEX) 12 hr tablet 600-1,200 mg  600-1,200 mg Oral Q12H PRN Vallarie Mare, MD      . haloperidol lactate (HALDOL) injection 1-4 mg  1-4 mg Intravenous Q3H PRN Juanito Doom, MD   4 mg at 08/07/20 1029  . HYDROcodone-acetaminophen (NORCO/VICODIN) 5-325 MG per tablet 1 tablet  1 tablet Oral Q4H PRN Vallarie Mare, MD   1 tablet at 08/07/20 0009  . insulin aspart (novoLOG) injection 0-15 Units  0-15 Units Subcutaneous Q6H Vallarie Mare, MD   2 Units at 08/07/20 [phone]  . labetalol (NORMODYNE) injection  10 mg  10 mg Intravenous Q10 min PRN Vallarie Mare, MD   10 mg at 08/05/20 0549  . levETIRAcetam (KEPPRA) tablet 1,000 mg  1,000 mg Oral BID Vallarie Mare, MD   1,000 mg at 08/07/20 0949  . lisinopril (ZESTRIL) tablet 20 mg  20 mg Oral Daily Vallarie Mare, MD   20 mg at 08/07/20 0950  . LORazepam (ATIVAN) injection 2 mg  2 mg Intravenous Once PRN Vallarie Mare, MD      . magnesium hydroxide (MILK OF MAGNESIA) suspension 30 mL  30 mL Oral QHS PRN Vallarie Mare, MD      . metoprolol tartrate (LOPRESSOR) tablet 75 mg  75 mg Oral BID Vallarie Mare, MD   75 mg at 08/07/20 0949  . morphine 2 MG/ML injection 1-2 mg  1-2 mg Intravenous Q2H PRN Vallarie Mare, MD      . nicotine (NICODERM CQ - dosed in mg/24 hr) patch 7 mg  7 mg Transdermal Daily Vallarie Mare, MD   7 mg at 08/07/20 0174  . ondansetron (ZOFRAN) tablet 4 mg  4 mg Oral Q4H PRN Vallarie Mare, MD       Or  . ondansetron [facility]) injection 4 mg  4 mg Intravenous Q4H PRN Vallarie Mare, MD      . pantoprazole (PROTONIX) EC tablet 40 mg  40 mg Oral BID AC Vallarie Mare, MD   40 mg at 08/07/20 0747  . polyethylene glycol (MIRALAX / GLYCOLAX) packet 17 g  17 g Oral Daily PRN Vallarie Mare, MD      . promethazine (PHENERGAN) tablet 12.5-25 mg  12.5-25 mg Oral Q4H PRN Vallarie Mare, MD      . sodium phosphate  (FLEET) 7-19 GM/118ML enema 1 enema  1 enema Rectal Once PRN Vallarie Mare, MD      . tamsulosin [facility]) capsule 0.4 mg  0.4 mg Oral Daily Vallarie Mare, MD   0.4 mg at 08/07/20 0951  . thiamine tablet 100 mg  100 mg Oral Daily Vallarie Mare, MD   100 mg at 08/07/20 0950  . traMADol (ULTRAM) tablet 50 mg  50 mg Oral Q12H PRN Vallarie Mare, MD   50 mg at 08/06/20 2218  . vitamin B-12 (CYANOCOBALAMIN) tablet 500 mcg  500 mcg Oral Daily Vallarie Mare, MD   500 mcg at 08/07/20 9449     Discharge Medications: Please see discharge summary for a list of discharge medications.  Relevant Imaging Results:  Relevant Lab Results:   Additional Information SS# [facility] [place], [place]

## 2020-08-08 DIAGNOSIS — G9341 Metabolic encephalopathy: Secondary | ICD-10-CM | POA: Diagnosis not present

## 2020-08-08 DIAGNOSIS — I5032 Chronic diastolic (congestive) heart failure: Secondary | ICD-10-CM | POA: Diagnosis not present

## 2020-08-08 DIAGNOSIS — D496 Neoplasm of unspecified behavior of brain: Secondary | ICD-10-CM | POA: Diagnosis not present

## 2020-08-08 DIAGNOSIS — I10 Essential (primary) hypertension: Secondary | ICD-10-CM | POA: Diagnosis not present

## 2020-08-08 LAB — TYPE AND SCREEN
ABO/RH(D): O POS
Antibody Screen: NEGATIVE
Unit division: 0
Unit division: 0

## 2020-08-08 LAB — GLUCOSE, CAPILLARY
Glucose-Capillary: 122 mg/dL — ABNORMAL HIGH (ref 70–99)
Glucose-Capillary: 129 mg/dL — ABNORMAL HIGH (ref 70–99)
Glucose-Capillary: 156 mg/dL — ABNORMAL HIGH (ref 70–99)
Glucose-Capillary: 159 mg/dL — ABNORMAL HIGH (ref 70–99)
Glucose-Capillary: 167 mg/dL — ABNORMAL HIGH (ref 70–99)
Glucose-Capillary: 180 mg/dL — ABNORMAL HIGH (ref 70–99)

## 2020-08-08 LAB — BPAM RBC
Blood Product Expiration Date: 202110302359
Blood Product Expiration Date: 202110302359
ISSUE DATE / TIME: 202109291446
ISSUE DATE / TIME: 202109291446
Unit Type and Rh: 5100
Unit Type and Rh: 5100

## 2020-08-08 MED ORDER — QUETIAPINE FUMARATE 50 MG PO TABS
25.0000 mg | ORAL_TABLET | Freq: Two times a day (BID) | ORAL | Status: DC
  Filled 2020-08-08 (×2): qty 1

## 2020-08-08 MED ORDER — INSULIN ASPART 100 UNIT/ML ~~LOC~~ SOLN
0.0000 [IU] | Freq: Three times a day (TID) | SUBCUTANEOUS | Status: DC
  Administered 2020-08-08: 3 [IU] via SUBCUTANEOUS
  Administered 2020-08-09: 2 [IU] via SUBCUTANEOUS
  Administered 2020-08-09: 3 [IU] via SUBCUTANEOUS
  Administered 2020-08-10: 2 [IU] via SUBCUTANEOUS
  Administered 2020-08-10: 3 [IU] via SUBCUTANEOUS
  Administered 2020-08-10: 2 [IU] via SUBCUTANEOUS
  Administered 2020-08-12: 3 [IU] via SUBCUTANEOUS
  Administered 2020-08-12: 2 [IU] via SUBCUTANEOUS
  Administered 2020-08-13: 3 [IU] via SUBCUTANEOUS
  Administered 2020-08-14 – 2020-08-15 (×2): 2 [IU] via SUBCUTANEOUS

## 2020-08-08 NOTE — Progress Notes (Signed)
PROGRESS NOTE    Francisco [name]   BTD:[id number]  DOB: 1946/05/06  DOA: 08/02/2020 PCP: Lesia Hausen, PA   Brief Narrative:  Francisco [name] 74 year old male was at [facility] assisted living with past medical history of seizure disorder, alcohol abuse (in remission), hypertension, hyperlipidemia, celiac disease, diastolic congestive heart failure (EF 55-60% via Echo 2016), history of [place] parotid tumor (excised/radiated 08/2018), bilateral carotid stenosis, gout who presents to [facility] emergency department from his assisted living facility due to witnessed seizures. He had 3 seizures after which EMS was called and he was brought to the hospital. Upon eval in the ED he was found to have a right occipital lobe lesion measuring 6 cm x 4 cm with surrounding edema and evidence of midline shift to the left.  Neurosurgery recommended initiating Decadron.  Neurology recommended a IV Keppra bolus The patient was admitted to the triad hospitalist service. After further history was obtained it was determined that the patient was having weakness and frequent falls over the past couple of weeks.  Also noted was peripheral vision loss.  Subjective: He was agitated this AM per RN. Given Seroquel and subsequently sleeping.     Assessment & Plan:   Principal Problem:   Seizure disorder- provoked seizures likely related to occipital mass -Due to incompatible pacemaker, MRIs cannot be obtained - 9/29- Right Parietal-occipital craniotomy and tumor resection - cont Keppra 1000 mg BID  - on Decadron taper per NS   Active Problems:  Acute encephalopathy - occurring after surgery- he fell out of bed that night - ? If he is in alcohol withdrawal- history suggesting alcohol abuse  - ? If delirium related to steroids, anaesthesia or ICU stay - ? 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  Consultants:   NS Procedures:   9/29- Right Parietal-occipital craniotomy and tumor resection  Antimicrobials:  Anti-infectives (From admission, onward)   Start     Dose/Rate Route Frequency Ordered Stop   08/04/20 1930  ceFAZolin (ANCEF) IVPB 1 g/50 mL premix        1 g 100 mL/hr over 30 Minutes Intravenous Every 8 hours 08/04/20 1921 08/05/20 1333   08/04/20 0800  ceFAZolin (ANCEF) IVPB 2g/100 mL premix        2 g 200 mL/hr over 30 Minutes Intravenous To [facility] 08/04/20 0713 08/04/20 1237       Objective: Vitals:  08/08/20 0308 08/08/20 0737 08/08/20 1128 08/08/20 1211  BP: (!) 145/74 (!) 155/82 112/63 139/79  Pulse:  75 60 (!) 59  Resp: 15 16 (!) 21   Temp: 99.3 F (37.4 C) 98.5 F (36.9 C) 98.6 F (37 C) 98.5 F (36.9 C)  TempSrc: Axillary  Axillary Oral  SpO2:  97% 96% 99%  Weight:      Height:        Intake/Output Summary (Last 24 hours) at 08/08/2020 1532 Last data filed at 08/08/2020 1356 Gross per 24 hour  Intake 120 ml  Output 1300 ml  Net -1180 ml   Filed Weights   08/02/20 1907 08/04/20 1044  Weight: 87 kg 87 kg    Examination: General exam: Appears comfortable  Respiratory system: Clear to auscultation. Respiratory effort  normal. Cardiovascular system: S1 & S2 heard,  No murmurs  Gastrointestinal system: Abdomen soft, non-tender, nondistended. Normal bowel sounds  Extremities: No cyanosis, clubbing or edema Skin: No rashes or ulcers      Data Reviewed: I have personally reviewed following labs and imaging studies  CBC: Recent Labs  Lab 08/02/20 1930 08/02/20 1930 08/03/20 0346 08/03/20 0346 08/04/20 0722 08/04/20 0722 08/04/20 1333 08/04/20 1515 08/05/20 0432 08/06/20 0551 08/07/20 0415  WBC 8.3   < > 6.4  --  14.8*  --   --   --  16.7* 13.6* 15.4*  NEUTROABS 6.1  --  5.9  --   --   --   --   --  15.6* 12.4*  --   HGB 10.4*   < > 10.2*   < > 10.6*   < > 10.2* 10.2* 9.9* 10.5* 10.4*  HCT 30.5*   < > 29.2*   < > 30.8*   < > 30.0* 30.0* 30.0* 30.2* 29.7*  MCV 88.9   < > 87.7  --  87.0  --   --   --  88.0 88.3 88.1  PLT 240   < > 228  --  249  --   --   --  199 194 172   < > = values in this interval not displayed.   Basic Metabolic Panel: Recent Labs  Lab 08/03/20 0346 08/03/20 0346 08/04/20 0722 08/04/20 0722 08/04/20 1333 08/04/20 1515 08/05/20 0432 08/06/20 0551 08/07/20 0415  NA 133*   < > 137   < > 135 135 136 137 136  K 4.4   < > 4.1   < > 3.9 4.2 3.8 3.9 3.5  CL 99  --  103  --   --   --  104 104 102  CO2 24  --  23  --   --   --  23 24 25   GLUCOSE 154*  --  126*  --   --   --  154* 144* 149*  BUN 24*  --  22  --   --   --  16 20 24*  CREATININE 1.35*  --  1.22  --   --   --  0.93 1.04 1.06  CALCIUM 9.3  --  9.6  --   --   --  9.2 9.3 9.1  MG 1.5*  --  1.5*  --   --   --   --   --   --    < > = values in this interval not displayed.   GFR: Estimated Creatinine Clearance: 66.8 mL/min (by C-G formula based on SCr of 1.06 mg/dL). Liver Function Tests: Recent  Labs  Lab 08/02/20 1930 08/03/20 0346 08/06/20 0551  AST 21 19 24   ALT 14 12 15   ALKPHOS 70 67 47  BILITOT 0.2* 0.5 0.3  PROT 7.2 6.9 6.1*  ALBUMIN 4.0 3.6 3.2*   No results for input(s): LIPASE, AMYLASE in  the last 168 hours. No results for input(s): AMMONIA in the last 168 hours. Coagulation Profile: Recent Labs  Lab 08/03/20 0346  INR 1.0   Cardiac Enzymes: No results for input(s): CKTOTAL, CKMB, CKMBINDEX, TROPONINI in the last 168 hours. BNP (last 3 results) No results for input(s): PROBNP in the last 8760 hours. HbA1C: Recent Labs    08/06/20 0551  HGBA1C 6.4*   CBG: Recent Labs  Lab 08/07/20 1834 08/08/20 0019 08/08/20 0607 08/08/20 1129 08/08/20 1212  GLUCAP 120* 129* 122* 180* 167*   Lipid Profile: No results for input(s): CHOL, HDL, LDLCALC, TRIG, CHOLHDL, LDLDIRECT in the last 72 hours. Thyroid Function Tests: No results for input(s): TSH, T4TOTAL, FREET4, T3FREE, THYROIDAB in the last 72 hours. Anemia Panel: No results for input(s): VITAMINB12, FOLATE, FERRITIN, TIBC, IRON, RETICCTPCT in the last 72 hours. Urine analysis:    Component Value Date/Time   COLORURINE YELLOW 08/02/2020 2208   APPEARANCEUR CLEAR 08/02/2020 2208   LABSPEC 1.009 08/02/2020 2208   PHURINE 5.0 08/02/2020 2208   GLUCOSEU NEGATIVE 08/02/2020 2208   HGBUR NEGATIVE 08/02/2020 2208   BILIRUBINUR NEGATIVE 08/02/2020 2208   KETONESUR NEGATIVE 08/02/2020 2208   PROTEINUR NEGATIVE 08/02/2020 2208   UROBILINOGEN 0.2 08/13/2014 1010   NITRITE NEGATIVE 08/02/2020 2208   LEUKOCYTESUR NEGATIVE 08/02/2020 2208   Sepsis Labs: @LABRCNTIP (procalcitonin:4,lacticidven:4) ) Recent Results (from the past 240 hour(s))  Respiratory Panel by RT PCR (Flu A&B, Covid) - Urine, Clean Catch     Status: None   Collection Time: 08/02/20 10:08 PM   Specimen: Urine, Clean Catch; Nasopharyngeal  Result Value Ref Range Status   SARS Coronavirus 2 by RT PCR NEGATIVE NEGATIVE Final    Comment: (NOTE) SARS-CoV-2 target nucleic acids are NOT DETECTED.  The SARS-CoV-2 RNA is generally detectable in upper respiratoy specimens during the acute phase of infection. The lowest concentration of SARS-CoV-2 viral  copies this assay can detect is 131 copies/mL. A negative result does not preclude SARS-Cov-2 infection and should not be used as the sole basis for treatment or other patient management decisions. A negative result may occur with  improper specimen collection/handling, submission of specimen other than nasopharyngeal swab, presence of viral mutation(s) within the areas targeted by this assay, and inadequate number of viral copies (<131 copies/mL). A negative result must be combined with clinical observations, patient history, and epidemiological information. The expected result is Negative.  Fact Sheet for Patients:  PinkCheek.be  Fact Sheet for Healthcare Providers:  GravelBags.it  This test is no t yet approved or cleared by the [place] FDA and  has been authorized for detection and/or diagnosis of SARS-CoV-2 by FDA under an Emergency Use Authorization (EUA). This EUA will remain  in effect (meaning this test can be used) for the duration of the COVID-19 declaration under Section 564(b)(1) of the Act, 21 U.S.C. section 360bbb-3(b)(1), unless the authorization is terminated or revoked sooner.     Influenza A by PCR NEGATIVE NEGATIVE Final   Influenza B by PCR NEGATIVE NEGATIVE Final    Comment: (NOTE) The Xpert Xpress SARS-CoV-2/FLU/RSV assay is intended as an aid in  the diagnosis of influenza from Nasopharyngeal swab specimens and  should not be used as a sole  basis for treatment. Nasal washings and  aspirates are unacceptable for Xpert Xpress SARS-CoV-2/FLU/RSV  testing.  Fact Sheet for Patients: PinkCheek.be  Fact Sheet for Healthcare Providers: GravelBags.it  This test is not yet approved or cleared by the [place] FDA and  has been authorized for detection and/or diagnosis of SARS-CoV-2 by  FDA under an Emergency Use Authorization (EUA). This  EUA will remain  in effect (meaning this test can be used) for the duration of the  Covid-19 declaration under Section 564(b)(1) of the Act, 21  U.S.C. section 360bbb-3(b)(1), unless the authorization is  terminated or revoked. Performed at [facility], [place] [address] [place], [place] [address]   MRSA PCR Screening     Status: None   Collection Time: 08/03/20  6:09 AM   Specimen: Nasal Mucosa; Nasopharyngeal  Result Value Ref Range Status   MRSA by PCR NEGATIVE NEGATIVE Final    Comment:        The GeneXpert MRSA Assay (FDA approved for NASAL specimens only), is one component of a comprehensive MRSA colonization surveillance program. It is not intended to diagnose MRSA infection nor to guide or monitor treatment for MRSA infections. Performed at [facility], [place] [address], [place], [place] [address]   Surgical pcr screen     Status: None   Collection Time: 08/04/20  7:44 AM   Specimen: Nasal Mucosa; Nasal Swab  Result Value Ref Range Status   MRSA, PCR NEGATIVE NEGATIVE Final   Staphylococcus aureus NEGATIVE NEGATIVE Final    Comment: (NOTE) The Xpert SA Assay (FDA approved for NASAL specimens in patients 44 years of age and older), is one component of a comprehensive surveillance program. It is not intended to diagnose infection nor to guide or monitor treatment. Performed at [facility], [place] [address] [place], [place] [address]   Aerobic/Anaerobic Culture (surgical/deep wound)     Status: None (Preliminary result)   Collection Time: 08/04/20  2:03 PM   Specimen: PATH Other; Tissue  Result Value Ref Range Status   Specimen Description TISSUE  Final   Special Requests RIGHT PARIETAL OCCIPITAL MASS SPEC A  Final   Gram Stain   Final    FEW WBC PRESENT, PREDOMINANTLY MONONUCLEAR NO ORGANISMS SEEN    Culture   Final    NO GROWTH 4 DAYS NO ANAEROBES ISOLATED; CULTURE IN PROGRESS FOR 5 DAYS Performed at [facility], [place] [address] [place], [place] [address]    Report Status PENDING  Incomplete         Radiology Studies: No results found.    Scheduled Meds: . allopurinol  300 mg Oral Daily  . atorvastatin  20 mg Oral Daily  . Chlorhexidine Gluconate Cloth  6 each Topical Daily  . dexamethasone  4 mg Oral Q8H   Followed by  . [START ON 08/09/2020] dexamethasone  2 mg Oral Q8H   Followed by  . [START ON 08/11/2020] dexamethasone  2 mg Oral Q12H   Followed by  . [START ON 08/13/2020] dexamethasone  1 mg Oral Q12H   Followed by  . [START ON 08/15/2020] dexamethasone  1 mg Oral Daily  . docusate sodium  100 mg Oral BID  . enoxaparin (LOVENOX) injection  40 mg Subcutaneous Q24H  . folic acid  1 mg Oral Daily  . insulin aspart  0-15 Units Subcutaneous Q6H  . levETIRAcetam  1,000 mg Oral BID  . lisinopril  20 mg Oral Daily  .  metoprolol tartrate  75 mg Oral BID  . nicotine  7 mg Transdermal Daily  . pantoprazole  40 mg Oral BID AC  . QUEtiapine  25 mg Oral BID  . tamsulosin  0.4 mg Oral Daily  . thiamine  100 mg Oral Daily  . vitamin B-12  500 mcg Oral Daily   Continuous Infusions:    LOS: 5 days      Debbe Odea, MD [facility] Pager: [email or web address] 08/08/2020, 3:32 PM

## 2020-08-08 NOTE — Progress Notes (Signed)
  NEUROSURGERY PROGRESS NOTE   No issues overnight. No concerns this am  EXAM:  BP (!) 155/82 (BP Location: Left Arm)   Pulse 75   Temp 98.5 F (36.9 C)   Resp 16   Ht 5' 9.02" (1.753 m)   Wt 87 kg   SpO2 97%   BMI 28.31 kg/m   Awake, alert, oriented Speech dysarthric but fluent, appropriate CN grossly intact MAE well Incision: c/d/i  IMPRESSION/PLAN 74 y.o. male POD#4 s/p resection right parieto-occipital mass, doing well - Cont supportive care - Cont dex taper and Keppra

## 2020-08-08 NOTE — Progress Notes (Signed)
Inpatient Rehab Admissions Coordinator Note:   Per therapy recommendations, pt was screened for CIR candidacy by Clemens Catholic, [place] CCC-SLP. At this time, Pt. Appears to have functional decline and is a good candidate for CIR. Will place order for rehab consult per protocol.  Please contact me with questions.   Clemens Catholic, [place], [place] Admissions Coordinator  [phone] ([place]) [phone] (office)

## 2020-08-09 ENCOUNTER — Telehealth: Payer: Self-pay | Admitting: Radiation Therapy

## 2020-08-09 ENCOUNTER — Other Ambulatory Visit: Payer: Self-pay | Admitting: Radiation Therapy

## 2020-08-09 DIAGNOSIS — D496 Neoplasm of unspecified behavior of brain: Secondary | ICD-10-CM | POA: Diagnosis not present

## 2020-08-09 DIAGNOSIS — R569 Unspecified convulsions: Secondary | ICD-10-CM | POA: Diagnosis not present

## 2020-08-09 LAB — CBC
HCT: 31.2 % — ABNORMAL LOW (ref 39.0–52.0)
Hemoglobin: 10.7 g/dL — ABNORMAL LOW (ref 13.0–17.0)
MCH: 30.1 pg (ref 26.0–34.0)
MCHC: 34.3 g/dL (ref 30.0–36.0)
MCV: 87.6 fL (ref 80.0–100.0)
Platelets: 208 10*3/uL (ref 150–400)
RBC: 3.56 MIL/uL — ABNORMAL LOW (ref 4.22–5.81)
RDW: 15.1 % (ref 11.5–15.5)
WBC: 9.1 10*3/uL (ref 4.0–10.5)
nRBC: 0 % (ref 0.0–0.2)

## 2020-08-09 LAB — AEROBIC/ANAEROBIC CULTURE W GRAM STAIN (SURGICAL/DEEP WOUND): Culture: NO GROWTH

## 2020-08-09 LAB — BASIC METABOLIC PANEL
Anion gap: 10 (ref 5–15)
BUN: 23 mg/dL (ref 8–23)
CO2: 23 mmol/L (ref 22–32)
Calcium: 9.2 mg/dL (ref 8.9–10.3)
Chloride: 103 mmol/L (ref 98–111)
Creatinine, Ser: 0.82 mg/dL (ref 0.61–1.24)
GFR calc Af Amer: >60 mL/min (ref >60)
GFR calc non Af Amer: >60 mL/min (ref >60)
Glucose, Bld: 142 mg/dL — ABNORMAL HIGH (ref 70–99)
Potassium: 3.9 mmol/L (ref 3.5–5.1)
Sodium: 136 mmol/L (ref 135–145)

## 2020-08-09 LAB — GLUCOSE, CAPILLARY
Glucose-Capillary: 133 mg/dL — ABNORMAL HIGH (ref 70–99)
Glucose-Capillary: 187 mg/dL — ABNORMAL HIGH (ref 70–99)
Glucose-Capillary: 164 mg/dL — ABNORMAL HIGH (ref 70–99)
Glucose-Capillary: 169 mg/dL — ABNORMAL HIGH (ref 70–99)

## 2020-08-09 LAB — SURGICAL PATHOLOGY

## 2020-08-09 MED ORDER — LEVETIRACETAM 750 MG PO TABS
750.0000 mg | ORAL_TABLET | Freq: Two times a day (BID) | ORAL | Status: DC
  Administered 2020-08-09 – 2020-08-16 (×15): 750 mg via ORAL
  Filled 2020-08-09 (×15): qty 1

## 2020-08-09 NOTE — Progress Notes (Signed)
Inpatient Rehab Admissions:  Inpatient Rehab Consult received.  I attempted to meet with patient x2 at the bedside for rehabilitation assessment and to discuss goals and expectations of an inpatient rehab admission.  He is very lethargic on both attempts, does not open his eyes to loud voice, bright light, or moderate touch except to mumble "stop".  Will try and f/u with patient later this afternoon as it appears he was alert enough to participate with therapy this morning.   Signed: Shann Medal, PT, DPT Admissions Coordinator [phone] 08/09/20  12:23 PM

## 2020-08-09 NOTE — Social Work (Signed)
CSW working remotely-CSW spoke with [employer], patient is alert x4 but is currently sleep. CSW will attempt to complete assessment later today.   Thurmond Butts, MSW, [place] Clinical Social Worker

## 2020-08-09 NOTE — Telephone Encounter (Addendum)
I spoke with Francisco [name] about her brother's recent admission and surgery. His case was reviewed in our brain and spine conference, discussing his surgery and the possibility of post operative radiation once he has recovered. Dr. Isidore Moos asked me to follow up with Francisco [name]'s family to see if they are interested in a telemedicine visit including them before the patient's discharge, or to wait until Francisco [name] has recovered and have him seen as an outpatient. Francisco [name] said that Francisco [name] is very independent and prefers to make his own medical decisions if possible. She asked that we wait until after he has recovered and have an outpatient visit to discuss the possibility of post operative radiation directly with the patient.   Mont Dutton R.T.(R)(T) Radiation Special Procedures Navigator

## 2020-08-09 NOTE — Progress Notes (Signed)
PROGRESS NOTE    Francisco [name]   XVQ:[id number]  DOB: [birth year]  DOA: 08/02/2020 PCP: Lesia Hausen, PA   Brief Narrative:  Francisco [name] 74 year old male was at [facility] assisted living with past medical history of seizure disorder, alcohol abuse (in remission), hypertension, hyperlipidemia, celiac disease, diastolic congestive heart failure (EF 55-60% via Echo 2016), history of [place] parotid tumor (excised/radiated 08/2018), bilateral carotid stenosis, gout who presents to [facility] emergency department from his assisted living facility due to witnessed seizures. He had 3 seizures after which EMS was called and he was brought to the hospital. Upon eval in the ED he was found to have a right occipital lobe lesion measuring 6 cm x 4 cm with surrounding edema and evidence of midline shift to the left.  Neurosurgery recommended initiating Decadron.  Neurology recommended a IV Keppra bolus The patient was admitted to the triad hospitalist service. After further history was obtained it was determined that the patient was having weakness and frequent falls over the past couple of weeks.  Also noted was peripheral vision loss.  Subjective: No complaints.     Assessment & Plan:   Principal Problem:   Seizure disorder- provoked seizures likely related to occipital mass -Due to incompatible pacemaker, MRIs cannot be obtained - 9/29- Right Parietal-occipital craniotomy and tumor resection - cont Keppra 1000 mg BID  - on Decadron taper per NS - per NS, ok to discharge- CIR is looking into taking him   Active Problems:  Acute encephalopathy - occurring after surgery- he fell out of bed that night - ? If he is in alcohol withdrawal- history suggesting alcohol abuse  - ? If delirium related to steroids, anaesthesia or ICU stay - ? If due to narcotics - briefly on Precedex on 9/20 -plan: -  tapering steroids  - cont thiamine  - started Seroquel and 1 dose made him  quite sleepy and thus I have stopped it. 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  Consultants:   NS Procedures:   9/29- Right Parietal-occipital craniotomy and tumor resection  Antimicrobials:  Anti-infectives (From admission, onward)   Start     Dose/Rate Route Frequency Ordered Stop   08/04/20 1930  ceFAZolin (ANCEF) IVPB 1 g/50 mL premix        1 g 100 mL/hr over 30 Minutes Intravenous Every 8 hours 08/04/20 1921 08/05/20 1333   08/04/20 0800  ceFAZolin (ANCEF) IVPB 2g/100 mL premix        2 g 200  mL/hr over 30 Minutes Intravenous To [facility] 08/04/20 0713 08/04/20 1237       Objective: Vitals:   08/09/20 0500 08/09/20 0822 08/09/20 1126 08/09/20 1200  BP:  (!) 160/70 101/60 108/60  Pulse:  60 (!) 59 60  Resp:  20 20 (!) 21  Temp:  98.1 F (36.7 C) 98 F (36.7 C)   TempSrc:  Oral Axillary   SpO2:  96% 95% 97%  Weight: 74.3 kg     Height:        Intake/Output Summary (Last 24 hours) at 08/09/2020 1517 Last data filed at 08/09/2020 1300 Gross per 24 hour  Intake 477 ml  Output 500 ml  Net -23 ml   Filed Weights   08/02/20 1907 08/04/20 1044 08/09/20 0500  Weight: 87 kg 87 kg 74.3 kg    Examination: General exam: Appears  comfortable  Respiratory system: Clear to auscultation. Respiratory effort normal. Cardiovascular system: S1 & S2 heard,  No murmurs  Gastrointestinal system: Abdomen soft, non-tender, nondistended. Normal bowel sounds  Extremities: No cyanosis, clubbing or edema Skin: No rashes or ulcers      Data Reviewed: I have personally reviewed following labs and imaging studies  CBC: Recent Labs  Lab 08/02/20 1930 08/02/20 1930 08/03/20 0346 08/03/20 0346 08/04/20 0722 08/04/20 1333 08/04/20 1515 08/05/20 0432 08/06/20 0551 08/07/20 0415 08/09/20 0626  WBC 8.3   < > 6.4   < > 14.8*  --   --  16.7* 13.6* 15.4* 9.1  NEUTROABS 6.1  --  5.9  --   --   --   --  15.6* 12.4*  --   --   HGB 10.4*   < > 10.2*   < > 10.6*   < > 10.2* 9.9* 10.5* 10.4* 10.7*  HCT 30.5*   < > 29.2*   < > 30.8*   < > 30.0* 30.0* 30.2* 29.7* 31.2*  MCV 88.9   < > 87.7   < > 87.0  --   --  88.0 88.3 88.1 87.6  PLT 240   < > 228   < > 249  --   --  199 194 172 208   < > = values in this interval not displayed.   Basic Metabolic Panel: Recent Labs  Lab 08/03/20 0346 08/03/20 0346 08/04/20 0722 08/04/20 1333 08/04/20 1515 08/05/20 0432 08/06/20 0551 08/07/20 0415 08/09/20 0626  NA 133*   < > 137   < > 135 136 137 136 136  K 4.4   < > 4.1   < > 4.2 3.8 3.9 3.5 3.9  CL 99   < > 103  --   --  104 104 102 103  CO2 24   < > 23  --   --  23 24 25 23   GLUCOSE 154*   < > 126*  --   --  154* 144* 149* 142*  BUN 24*   < > 22  --   --  16 20 24* 23  CREATININE 1.35*   < > 1.22  --   --  0.93 1.04 1.06 0.82  CALCIUM 9.3   < > 9.6  --   --  9.2 9.3 9.1 9.2  MG 1.5*  --  1.5*  --   --   --   --   --   --    < > = values in this interval not displayed.   GFR: Estimated Creatinine Clearance: 79 mL/min (by C-G  formula based on SCr of 0.82 mg/dL). Liver Function Tests: Recent Labs  Lab 08/02/20 1930 08/03/20 0346 08/06/20 0551  AST 21 19 24   ALT 14 12 15   ALKPHOS 70 67 47  BILITOT 0.2* 0.5 0.3  PROT 7.2 6.9  6.1*  ALBUMIN 4.0 3.6 3.2*   No results for input(s): LIPASE, AMYLASE in the last 168 hours. No results for input(s): AMMONIA in the last 168 hours. Coagulation Profile: Recent Labs  Lab 08/03/20 0346  INR 1.0   Cardiac Enzymes: No results for input(s): CKTOTAL, CKMB, CKMBINDEX, TROPONINI in the last 168 hours. BNP (last 3 results) No results for input(s): PROBNP in the last 8760 hours. HbA1C: No results for input(s): HGBA1C in the last 72 hours. CBG: Recent Labs  Lab 08/08/20 1212 08/08/20 1706 08/08/20 2209 08/09/20 0821 08/09/20 1125  GLUCAP 167* 156* 159* 133* 187*   Lipid Profile: No results for input(s): CHOL, HDL, LDLCALC, TRIG, CHOLHDL, LDLDIRECT in the last 72 hours. Thyroid Function Tests: No results for input(s): TSH, T4TOTAL, FREET4, T3FREE, THYROIDAB in the last 72 hours. Anemia Panel: No results for input(s): VITAMINB12, FOLATE, FERRITIN, TIBC, IRON, RETICCTPCT in the last 72 hours. Urine analysis:    Component Value Date/Time   COLORURINE YELLOW 08/02/2020 [place] 08/02/2020 2208   LABSPEC 1.009 08/02/2020 2208   PHURINE 5.0 08/02/2020 2208   GLUCOSEU NEGATIVE 08/02/2020 2208   HGBUR NEGATIVE 08/02/2020 2208   BILIRUBINUR NEGATIVE 08/02/2020 2208   KETONESUR NEGATIVE 08/02/2020 2208   PROTEINUR NEGATIVE 08/02/2020 2208   UROBILINOGEN 0.2 08/13/2014 1010   NITRITE NEGATIVE 08/02/2020 2208   LEUKOCYTESUR NEGATIVE 08/02/2020 2208   Sepsis Labs: @LABRCNTIP (procalcitonin:4,lacticidven:4) ) Recent Results (from the past 240 hour(s))  Respiratory Panel by RT PCR (Flu A&B, Covid) - Urine, Clean Catch     Status: None   Collection Time: 08/02/20 10:08 PM   Specimen: Urine, Clean Catch; Nasopharyngeal  Result Value Ref Range Status   SARS Coronavirus 2 by RT PCR NEGATIVE NEGATIVE Final    Comment: (NOTE) SARS-CoV-2 target nucleic acids are NOT DETECTED.  The SARS-CoV-2 RNA is generally detectable in upper respiratoy specimens during  the acute phase of infection. The lowest concentration of SARS-CoV-2 viral copies this assay can detect is 131 copies/mL. A negative result does not preclude SARS-Cov-2 infection and should not be used as the sole basis for treatment or other patient management decisions. A negative result may occur with  improper specimen collection/handling, submission of specimen other than nasopharyngeal swab, presence of viral mutation(s) within the areas targeted by this assay, and inadequate number of viral copies (<131 copies/mL). A negative result must be combined with clinical observations, patient history, and epidemiological information. The expected result is Negative.  Fact Sheet for Patients:  PinkCheek.be  Fact Sheet for Healthcare Providers:  GravelBags.it  This test is no t yet approved or cleared by the [place] FDA and  has been authorized for detection and/or diagnosis of SARS-CoV-2 by FDA under an Emergency Use Authorization (EUA). This EUA will remain  in effect (meaning this test can be used) for the duration of the COVID-19 declaration under Section 564(b)(1) of the Act, 21 U.S.C. section 360bbb-3(b)(1), unless the authorization is terminated or revoked sooner.     Influenza A by PCR NEGATIVE NEGATIVE Final   Influenza B by PCR NEGATIVE NEGATIVE Final    Comment: (NOTE) The Xpert Xpress SARS-CoV-2/FLU/RSV assay is intended as an aid in  the diagnosis of influenza from Nasopharyngeal swab specimens  and  should not be used as a sole basis for treatment. Nasal washings and  aspirates are unacceptable for Xpert Xpress SARS-CoV-2/FLU/RSV  testing.  Fact Sheet for Patients: PinkCheek.be  Fact Sheet for Healthcare Providers: GravelBags.it  This test is not yet approved or cleared by the [place] FDA and  has been authorized for detection and/or  diagnosis of SARS-CoV-2 by  FDA under an Emergency Use Authorization (EUA). This EUA will remain  in effect (meaning this test can be used) for the duration of the  Covid-19 declaration under Section 564(b)(1) of the Act, 21  U.S.C. section 360bbb-3(b)(1), unless the authorization is  terminated or revoked. Performed at [facility], [place] [address] [place], [place] [address]   MRSA PCR Screening     Status: None   Collection Time: 08/03/20  6:09 AM   Specimen: Nasal Mucosa; Nasopharyngeal  Result Value Ref Range Status   MRSA by PCR NEGATIVE NEGATIVE Final    Comment:        The GeneXpert MRSA Assay (FDA approved for NASAL specimens only), is one component of a comprehensive MRSA colonization surveillance program. It is not intended to diagnose MRSA infection nor to guide or monitor treatment for MRSA infections. Performed at [facility], [place] [address], [place], [place] [address]   Surgical pcr screen     Status: None   Collection Time: 08/04/20  7:44 AM   Specimen: Nasal Mucosa; Nasal Swab  Result Value Ref Range Status   MRSA, PCR NEGATIVE NEGATIVE Final   Staphylococcus aureus NEGATIVE NEGATIVE Final    Comment: (NOTE) The Xpert SA Assay (FDA approved for NASAL specimens in patients 56 years of age and older), is one component of a comprehensive surveillance program. It is not intended to diagnose infection nor to guide or monitor treatment. Performed at [facility], [place] [address] [place], [place] [address]   Aerobic/Anaerobic Culture (surgical/deep wound)     Status: None   Collection Time: 08/04/20  2:03 PM   Specimen: PATH Other; Tissue  Result Value Ref Range Status   Specimen Description TISSUE  Final   Special Requests RIGHT PARIETAL OCCIPITAL MASS SPEC A  Final   Gram Stain   Final    FEW WBC PRESENT, PREDOMINANTLY MONONUCLEAR NO ORGANISMS SEEN    Culture   Final    No growth aerobically or anaerobically. Performed at  [facility], [place] [address] [place], [place] [address]    Report Status 08/09/2020 FINAL  Final         Radiology Studies: No results found.    Scheduled Meds: . allopurinol  300 mg Oral Daily  . atorvastatin  20 mg Oral Daily  . Chlorhexidine Gluconate Cloth  6 each Topical Daily  . dexamethasone  2 mg Oral Q8H   Followed by  . [START ON 08/11/2020] dexamethasone  2 mg Oral Q12H   Followed by  . [START ON 08/13/2020] dexamethasone  1 mg Oral Q12H   Followed by  . [START ON 08/15/2020] dexamethasone  1 mg Oral Daily  . docusate sodium  100 mg Oral BID  . enoxaparin (LOVENOX) injection  40 mg Subcutaneous Q24H  . folic acid  1 mg Oral Daily  . insulin aspart  0-15 Units Subcutaneous TID WC  . levETIRAcetam  750 mg Oral BID  . lisinopril  20 mg Oral Daily  . metoprolol tartrate  75 mg Oral BID  . nicotine  7 mg  Transdermal Daily  . pantoprazole  40 mg Oral BID AC  . QUEtiapine  25 mg Oral BID  . tamsulosin  0.4 mg Oral Daily  . thiamine  100 mg Oral Daily  . vitamin B-12  500 mcg Oral Daily   Continuous Infusions:    LOS: 6 days      Debbe Odea, MD [facility] Pager: [email or web address] 08/09/2020, 3:17 PM

## 2020-08-09 NOTE — Progress Notes (Signed)
Subjective: NAEs o/n  Objective: Vital signs in last 24 hours: Temp:  [98.1 F (36.7 C)-98.9 F (37.2 C)] 98.1 F (36.7 C) (10/04 0822) Pulse Rate:  [41-60] 60 (10/04 0822) Resp:  [18-23] 20 (10/04 0822) BP: (112-164)/(59-79) 160/70 (10/04 0822) SpO2:  [93 %-99 %] 96 % (10/04 0822) Weight:  [74.3 kg] 74.3 kg (10/04 0500)  Intake/Output from previous day: 10/03 0701 - 10/04 0700 In: 540 [P.O.:540] Out: 650 [Urine:650] Intake/Output this shift: No intake/output data recorded.  Awake, alert, oriented to month, hospital, person FC x 4, minimal LUE drift Incision c/d  Lab Results: Recent Labs    08/07/20 0415 08/09/20 0626  WBC 15.4* 9.1  HGB 10.4* 10.7*  HCT 29.7* 31.2*  PLT 172 208   BMET Recent Labs    08/07/20 0415 08/09/20 0626  NA 136 136  K 3.5 3.9  CL 102 103  CO2 25 23  GLUCOSE 149* 142*  BUN 24* 23  CREATININE 1.06 0.82  CALCIUM 9.1 9.2    Studies/Results: No results found.  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

## 2020-08-09 NOTE — Progress Notes (Signed)
Physical Therapy Treatment Patient Details Name: Francisco [name] MRN: [id number] DOB: October 24, 1946 Today's Date: 08/09/2020    History of Present Illness 74 year old male with past medical history of seizure disorder, alcohol abuse (in remission), hypertension, hyperlipidemia, celiac disease, diastolic congestive heart failure (EF 55-60% via Echo 2016), history of [place] parotid tumor (excised/radiated 08/2018), bilateral carotid stenosis, gout who presents to [place] emergency department from his assisted living facility due to witnessed seizures on 08/02/2020. Pt also reports progressive LE weakness with multiple falls over the past weeks as well as vision changes. CT head reveals new R occipital lesion with surrounding edema and evidence of midline shift. Pt underwent R parietal occipital craniotomy and tumor excision on 08/04/2020.    PT Comments    Patient with limited progress this session needing increased assist for sitting balance, standing balance and limited ability to take steps with RW due to significant L lateral lean and L LE weakness and L inattention.  Seems to know he cannot see well on that side, able to turn head in sitting to attempt to see therapist, but admits it is hard.  Keeping head and neck tilted L in bed, but improved positioning end of session in midline.  Would benefit form +2 A to attempt ambulation again.  Feel he may benefit from CIR level rehab at d/c.  PT to continue to follow.    Follow Up Recommendations  CIR     Equipment Recommendations  Wheelchair (measurements PT);Wheelchair cushion (measurements PT)    Recommendations for Other Services       Precautions / Restrictions Precautions Precautions: Fall Precaution Comments: Impuslive; fall out of recliner during admission; large L neglect/field cut    Mobility  Bed Mobility Overal bed mobility: Needs Assistance Bed Mobility: Rolling;Sidelying to Sit;Sit to Sidelying Rolling: Mod  assist Sidelying to sit: Mod assist     Sit to sidelying: Max assist General bed mobility comments: max cues, increased time, assist for reaching to R with L UE to grab rail, assist for guiding legs off bed, and to push up then transition L hand off rail to reach to L to right trunk, to sidelying assist and cues to lean on R UE then reach with L UE to R side then to bring legs up into bed  Transfers Overall transfer level: Needs assistance Equipment used: Rolling walker (2 wheeled);None Transfers: Sit to/from Stand;Lateral/Scoot Transfers Sit to Stand: From elevated surface;Mod assist;Max assist        Lateral/Scoot Transfers: Mod assist General transfer comment: sit<>stand from EOB to RW mod A but pt leaning to L, second sit to stand without walker, but pt unable to achieve standing due to c/o pain L wrist; attempted steps for stand pivot, but unable to effectively progress feet with time and mod to max A for balance leaning to L; finally able to perform 2 scoot/squat pivots to R toward HOB using bed rail with mod A  Ambulation/Gait Ambulation/Gait assistance: Max assist Gait Distance (Feet): 1 Feet Assistive device: Rolling walker (2 wheeled) Gait Pattern/deviations: Step-to pattern;Shuffle     General Gait Details: assist to get L hand on walker after sit to stand, attempted several steps, pt with some lateral weight shift at times could progress R LE, but even with R lateral weight shift and cues only scooting L foot on floor slightly   Stairs             Wheelchair Mobility    Modified Rankin (Stroke Patients Only)  Balance Overall balance assessment: Needs assistance Sitting-balance support: Feet supported Sitting balance-Leahy Scale: Poor Sitting balance - Comments: at times heavy L lean; with increased assist to keep midline pt pushing into support, removed support, pt able to briefly balance without support, only about 10 seconds prior to falling back to  L Postural control: Left lateral lean   Standing balance-Leahy Scale: Poor Standing balance comment: B UE support and mod A at least for standing balance                            Cognition Arousal/Alertness: Awake/alert Behavior During Therapy: Flat affect Overall Cognitive Status: Impaired/Different from baseline                   Orientation Level: Disoriented to;Time Current Attention Level: Sustained Memory: Decreased short-term memory;Decreased recall of precautions Following Commands: Follows one step commands with increased time Safety/Judgement: Decreased awareness of deficits   Problem Solving: Slow processing;Requires verbal cues;Requires tactile cues;Difficulty sequencing        Exercises      General Comments General comments (skin integrity, edema, etc.): VSS on RA, pt requesting cigarettes, discussed could ask for nicotine patch      Pertinent Vitals/Pain Pain Assessment: Faces Faces Pain Scale: Hurts little more Pain Location: R wrist Pain Descriptors / Indicators: Sore;Guarding Pain Intervention(s): Monitored during session    Home Living                      Prior Function            PT Goals (current goals can now be found in the care plan section) Progress towards PT goals: Not progressing toward goals - comment    Frequency    Min 4X/week      PT Plan Current plan remains appropriate    Co-evaluation              AM-PAC PT "6 Clicks" Mobility   Outcome Measure  Help needed turning from your back to your side while in a flat bed without using bedrails?: A Lot Help needed moving from lying on your back to sitting on the side of a flat bed without using bedrails?: Total Help needed moving to and from a bed to a chair (including a wheelchair)?: Total Help needed standing up from a chair using your arms (e.g., wheelchair or bedside chair)?: A Lot Help needed to walk in hospital room?: Total Help needed  climbing 3-5 steps with a railing? : Total 6 Click Score: 8    End of Session Equipment Utilized During Treatment: Gait belt Activity Tolerance: Patient limited by fatigue Patient left: in bed;with call bell/phone within reach   PT Visit Diagnosis: Unsteadiness on feet (R26.81);Other abnormalities of gait and mobility (R26.89);Muscle weakness (generalized) (M62.81);Repeated falls (R29.6);Other symptoms and signs involving the nervous system (R29.898)     Time: 7939-0300 PT Time Calculation (min) (ACUTE ONLY): 30 min  Charges:  $Therapeutic Activity: 8-22 mins $Neuromuscular Re-education: 8-22 mins                     Magda Kiel, PT Acute Rehabilitation Services [phone] Office:[phone] 08/09/2020    Reginia Naas 08/09/2020, 10:37 AM

## 2020-08-09 NOTE — Consult Note (Signed)
Physical Medicine and Rehabilitation Consult Reason for Consult: Altered mental status with decreased functional mobility and seizure Referring Physician: [facility]   HPI: Francisco [name] is a 74 y.o. 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  Per chart review patient resides in assisted living facility [facility]  Used a rolling walker prior to admission with noted falls.  Presented 08/03/2020 after witnessed seizure.  Cranial CT scan showed a 4 x 6 mm mass in the right occipital lobe with surrounding edema.  CT cervical spine negative.  Admission chemistry sodium 131, glucose 105, BUN 26, creatinine 1.61, hemoglobin 10.4, urinalysis negative nitrite, magnesium level 1.5.  CT of chest abdomen pelvis showed no acute process or evidence of primary malignancy metastatic disease.  Patient underwent right parieto-occipital craniotomy tumor excision 08/04/2020 per Dr. Duffy Rhody.  Maintained on Decadron protocol.  He continues on Keppra 1000 mg twice daily.  Lovenox initiated for DVT prophylaxis 08/05/2020.  Tolerating a regular diet.  Therapy evaluations completed with recommendations of physical medicine rehab consult.   Review of Systems  Constitutional: Negative for chills and fever.  HENT: Negative for hearing loss.   Eyes: Negative for blurred vision and double vision.  Respiratory: Negative for cough.        Shortness of breath with exertion  Cardiovascular: Negative for chest pain, palpitations and leg swelling.  Gastrointestinal: Positive for constipation. Negative for heartburn, nausea and vomiting.       GERD  Genitourinary: Positive for urgency. Negative for dysuria, flank pain and hematuria.  Musculoskeletal: Positive for falls and myalgias.  Skin:  Negative for rash.  Neurological: Positive for seizures.  All other systems reviewed and are negative.  Past Medical History:  Diagnosis Date  . Anemia   . Anginal pain ([place])    normal coronaries 2016  . Celiac disease   . CHF (congestive heart failure) ([place])   . COPD (chronic obstructive pulmonary disease) ([place])   . GERD (gastroesophageal reflux disease)   . Heart murmur   . History of radiation therapy 10/07/18- 11/04/18   Parotid, left 50 Gy in 20 fractions of 2.5 Gy.   Marland Kitchen Hypertension   . Multiple duodenal ulcers   . parotid gland cancer Left   . Presence of permanent cardiac pacemaker   . Right internal carotid occlusion   . Seizures ([place])   . Shortness of breath   . Symptomatic Bradycardia    a. 07/2015 s/p MDT Advisa L DC PPM (Ser #: [id number]).  . 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 APPLICATION OF CRANIAL NAVIGATION Right 08/04/2020   Procedure: APPLICATION OF CRANIAL NAVIGATION;  Surgeon: Vallarie Mare, MD;  Location: [facility];  Service: Neurosurgery;  Laterality: Right;  . CARDIAC CATHETERIZATION N/A 07/08/2015   Procedure: Left Heart Cath and Coronary Angiography;  Surgeon: Troy Sine, MD;  Location: [place] [facility];  Service: Cardiovascular;  Laterality: N/A;  . CARDIAC CATHETERIZATION N/A 07/08/2015   Procedure: Temporary Pacemaker;  Surgeon: Troy Sine, MD;  Location: [place] [facility];  Service: Cardiovascular;  Laterality: N/A;  . CARDIAC CATHETERIZATION N/A 07/09/2015   Procedure: Temporary Pacemaker;  Surgeon: Troy Sine, MD;  Location: [facility];  Service: Cardiovascular;  Laterality: N/A;  . CRANIOTOMY Right  08/04/2020   Procedure: RIGHT PARIETAL OCCIPITAL CRANIOTOMY TUMOR EXCISION;  Surgeon: Vallarie Mare, MD;  Location: [facility];  Service: Neurosurgery;  Laterality: Right;  . EP IMPLANTABLE DEVICE N/A 07/09/2015   Procedure: Pacemaker Implant;  Surgeon:  Will Meredith Leeds, MD;  Location: [place] [facility];  Service: Cardiovascular;  Laterality: N/A;  . ESOPHAGOGASTRODUODENOSCOPY  07/03/2012   Procedure: ESOPHAGOGASTRODUODENOSCOPY (EGD);  Surgeon: Winfield Cunas., MD;  Location: [facility];  Service: Endoscopy;  Laterality: N/A;  . HEMORRHOID SURGERY    . HERNIA REPAIR    . MOLE REMOVAL    . PAROTIDECTOMY Left 08/21/2018   superficial  . PAROTIDECTOMY Left 08/21/2018   Procedure: LEFT SUPERFICIAL PAROTIDECTOMY;  Surgeon: Helayne Seminole, MD;  Location: [facility];  Service: ENT;  Laterality: Left;   Family History  Problem Relation Age of Onset  . Hodgkin's lymphoma Father    Social History:  reports that he has been smoking cigarettes. He has a 12.50 pack-year smoking history. He uses smokeless tobacco. He reports previous alcohol use. He reports that he does not use drugs. Allergies: No Known Allergies Medications Prior to Admission  Medication Sig Dispense Refill  . allopurinol (ZYLOPRIM) 300 MG tablet TAKE 1 TABLET(300 MG) BY MOUTH DAILY (Patient taking differently: Take 300 mg by mouth daily. ) 90 tablet 0  . alum & mag hydroxide-simeth (MYLANTA) 200-200-20 MG/5ML suspension Take 30 mLs by mouth as needed for indigestion or heartburn.    Marland Kitchen atorvastatin (LIPITOR) 20 MG tablet Take 1 tablet (20 mg total) by mouth daily. 90 tablet 3  . colchicine 0.6 MG tablet Take 1 tablet (0.6 mg total) by mouth 2 (two) times daily as needed (gout flare). Take twice daily for 2 weeks, then use as needed for gout flare up 60 tablet 0  . furosemide (LASIX) 20 MG tablet TAKE 1 TABLET BY MOUTH DAILY (Patient taking differently: Take 20 mg by mouth daily as needed for fluid. ) 90 tablet 0  . guaiFENesin (MUCINEX) 600 MG 12 hr tablet Take 600-1,200 mg by mouth every 12 (twelve) hours as needed for cough.     Marland Kitchen guaiFENesin (ROBITUSSIN) 100 MG/5ML liquid Take 200 mg by mouth every 6 (six) hours as needed for cough.     . levETIRAcetam (KEPPRA) 750 MG  tablet Take 1,500 mg by mouth daily.    Marland Kitchen lisinopril (PRINIVIL,ZESTRIL) 20 MG tablet TAKE 1 TABLET(20 MG) BY MOUTH DAILY (Patient taking differently: Take 20 mg by mouth daily. ) 90 tablet 0  . loperamide (IMODIUM A-D) 2 MG tablet Take 2 mg by mouth as needed for diarrhea or loose stools.     . magnesium hydroxide (MILK OF MAGNESIA) 400 MG/5ML suspension Take 30 mLs by mouth at bedtime as needed for mild constipation.    . metFORMIN (GLUCOPHAGE) 500 MG tablet Take 500 mg by mouth daily with breakfast.    . metoprolol tartrate (LOPRESSOR) 50 MG tablet Take 1.5 tablets (75 mg total) by mouth 2 (two) times daily. 90 tablet 6  . naproxen sodium (ALEVE) 220 MG tablet Take 220 mg by mouth every 12 (twelve) hours as needed (joint pain).    Marland Kitchen neomycin-bacitracin-polymyxin (NEOSPORIN) ointment Apply 1 application topically as needed for wound care.    . pantoprazole (PROTONIX) 40 MG tablet TAKE 1 TABLET BY MOUTH TWICE DAILY BEFORE A MEAL (Patient taking differently: Take 40 mg by mouth 2 (two) times daily. ) 180 tablet 0  . tamsulosin (FLOMAX) 0.4 MG CAPS capsule TAKE 1  CAPSULE(0.4 MG) BY MOUTH DAILY (Patient taking differently: Take 0.4 mg by mouth daily. ) 90 capsule 0  . traMADol (ULTRAM) 50 MG tablet Take 50 mg by mouth 2 (two) times daily. 0800 and 2000    . vitamin B-12 (CYANOCOBALAMIN) 500 MCG tablet Take 500 mcg by mouth daily.    Marland Kitchen amLODipine (NORVASC) 5 MG tablet TAKE 1 TABLET(5 MG) BY MOUTH DAILY (Patient not taking: No sig reported) 90 tablet 0  . clotrimazole-betamethasone (LOTRISONE) cream APPLY EXTERNALLY TO THE AFFECTED AREA TWICE DAILY (Patient not taking: No sig reported) 45 g 0  . ferrous sulfate 325 (65 FE) MG tablet Take 1 tablet (325 mg total) by mouth daily. (Patient not taking: Reported on 04/04/2019)    . folic acid (FOLVITE) 1 MG tablet Take 1 tablet (1 mg total) by mouth daily. (Patient not taking: Reported on 04/04/2019)    . levofloxacin (LEVAQUIN) 500 MG tablet Take 1 tablet (500  mg total) by mouth daily. (Patient not taking: Reported on 09/19/2019) 10 tablet 0  . lidocaine (XYLOCAINE) 2 % solution Patient: Mix 1part 2% viscous lidocaine, 1part H20. Swish & swallow 47m of diluted mixture, 276m before meals and at bedtime, up to QID (Patient not taking: Reported on 04/04/2019) 200 mL 3  . Multiple Vitamin (MULTIVITAMIN WITH MINERALS) TABS tablet Take 1 tablet by mouth daily. (Patient not taking: Reported on 08/02/2020)    . neomycin-colistin-hydrocortisone-thonzonium (CORTISPORIN-TC) 3.01-06-09-0.5 MG/ML OTIC suspension Place 3 drops into the left ear 4 (four) times daily. (Patient not taking: Reported on 04/04/2019) 10 mL 0  . nystatin (MYCOSTATIN) 100000 UNIT/ML suspension Take 5 mLs (500,000 Units total) by mouth 4 (four) times daily. Swish and gargle in mouth for a minute, then swallow. This should help thrush in mouth. Use QID until bottle is empty. (Patient not taking: Reported on 08/02/2020) 473 mL 0  . polyethylene glycol (MIRALAX / GLYCOLAX) packet Take 17 g by mouth 2 (two) times daily. (Patient not taking: Reported on 08/02/2020) 14 each 0  . potassium chloride (K-DUR) 10 MEQ tablet TAKE 1 TABLET(10 MEQ) BY MOUTH DAILY (Patient not taking: No sig reported) 90 tablet 0  . thiamine 100 MG tablet Take 1 tablet (100 mg total) by mouth daily. (Patient not taking: Reported on 04/04/2019)      Home: Home Living [facility] expects to be [facility] [place] Rile 2 wheels Additional Comments: Pt is questionable historian. States he uses RW on daily basis, has had decr in peripheral vision and leg strength  Functional History: Prior Function Level of Independence: Needs assistance Gait / Transfers Assistance Needed: uses RW, has falls ADL's / Homemaking Assistance Needed: assist as needed from ALF staff Functional Status:  Mobility: Bed Mobility Overal bed mobility: Needs Assistance Bed Mobility: Supine to Sit Supine to  sit: Min assist General bed mobility comments: cues to initiate Transfers Overall transfer level: Needs assistance Equipment used: Rolling walker (2 wheeled) Transfers: Sit to/from Stand, StW.W. Grainger Incransfers Sit to Stand: Mod assist Stand pivot transfers: Mod assist, Max assist General transfer comment: pt performs 3 sit to stands and SPTs from bed to recliner to [facility] CtrPt requires assistance for impaired balance with one significant LOB requiring maxA to correct. Pt with impaired L attention and vision, improved transfer quality to right side Ambulation/Gait Ambulation/Gait assistance: Mod assist Gait Distance (Feet): 3 Feet Assistive device: Rolling walker (2 wheeled) Gait Pattern/deviations: Step-to pattern, Shuffle General Gait Details: pt with impaired control of LUE to  utilize RW, requires PT tactile and verbal cues to facilitate direction and manage RW due to impaired vision Gait velocity: reduced Gait velocity interpretation: <1.8 ft/sec, indicate of risk for recurrent falls    ADL: ADL Overall ADL's : Needs assistance/impaired Eating/Feeding: Moderate assistance, Sitting Grooming: Moderate assistance, Sitting Upper Body Bathing: Maximal assistance, Sitting, Standing, Cueing for safety, Cueing for sequencing Lower Body Bathing: Maximal assistance, Sitting/lateral leans, Sit to/from stand, Cueing for safety, Cueing for sequencing Upper Body Dressing : Maximal assistance, Sitting, Standing, Cueing for safety, Cueing for sequencing Lower Body Dressing: Maximal assistance, Sit to/from stand, Sitting/lateral leans, Cueing for safety, Cueing for sequencing Toilet Transfer: Maximal assistance, +2 for physical assistance, +2 for safety/equipment, Ambulation, Regular Toilet Toilet Transfer Details (indicate cue type and reason): max A +2 for stadying assist and navigation due to L neglect. pt not able to locate toilet on L in bathroom and ended up sitting on [facility] Toileting- Clothing  Manipulation and Hygiene: Total assistance, Sitting/lateral lean, Sit to/from stand Functional mobility during ADLs: Maximal assistance, +2 for physical assistance, +2 for safety/equipment, Cueing for safety, Cueing for sequencing, Rolling walker General ADL Comments: ADL participation limited by left neglect  Cognition: Cognition Overall Cognitive Status: Impaired/Different from baseline Arousal/Alertness: Awake/alert Orientation Level: Oriented X4 Attention: Selective Selective Attention: Impaired Selective Attention Impairment: Verbal basic Memory: Impaired Memory Impairment: Storage deficit, Retrieval deficit Awareness: Impaired Awareness Impairment: Emergent impairment Executive Function: Reasoning Reasoning: Impaired Reasoning Impairment: Verbal complex Safety/Judgment: Impaired Cognition Arousal/Alertness: Awake/alert Behavior During Therapy: Impulsive Overall Cognitive Status: Impaired/Different from baseline Area of Impairment: Orientation, Attention, Memory, Following commands, Safety/judgement, Awareness, Problem solving Orientation Level: Disoriented to, Time (pt reports guessing that it was october) Current Attention Level: Sustained Memory: Decreased short-term memory, Decreased recall of precautions Following Commands: Follows one step commands with increased time Safety/Judgement: Decreased awareness of safety, Decreased awareness of deficits Awareness: Emergent Problem Solving: Slow processing, Requires verbal cues, Difficulty sequencing, Requires tactile cues General Comments: pt unaware why he was in hospital until cued. Noted marked delay in processing time during ADL tasks (OT Took cup from pt and 2-3 mins later pt started moving hand and stating he was giving the cup back to the OT). At time would state nonsensical phrases. Overall very poor safety awareness  Blood pressure (!) 142/74, pulse (!) 41, temperature 98.2 F (36.8 C), temperature source Oral, resp.  rate (!) 21, height 5' 9.02" (1.753 m), weight 74.3 kg, SpO2 93 %. Physical Exam Constitutional:      Appearance: He is ill-appearing.  HENT:     Head:     Comments: Right occipital Craniotomy site clean and dry    Nose: Nose normal.     Mouth/Throat:     Pharynx: Oropharynx is clear.  Cardiovascular:     Rate and Rhythm: Bradycardia present.     Pulses: Normal pulses.  Pulmonary:     Effort: Pulmonary effort is normal.  Abdominal:     General: Abdomen is flat.  Neurological:     Comments: Difficult to arouse. Oriented to self. Opens eyes with verbal,tactile cueing. Would not keep them open. Followed basic commands only for me.  Moves all 4's R>L.  Psychiatric:     Comments: flat     Results for orders placed or performed during the hospital encounter of 08/02/20 (from the past 24 hour(s))  Glucose, capillary     Status: Abnormal   Collection Time: 08/08/20  6:07 AM  Result Value Ref Range   Glucose-Capillary 122 (H) 70 -  99 mg/dL  Glucose, capillary     Status: Abnormal   Collection Time: 08/08/20 11:29 AM  Result Value Ref Range   Glucose-Capillary 180 (H) 70 - 99 mg/dL  Glucose, capillary     Status: Abnormal   Collection Time: 08/08/20 12:12 PM  Result Value Ref Range   Glucose-Capillary 167 (H) 70 - 99 mg/dL  Glucose, capillary     Status: Abnormal   Collection Time: 08/08/20  5:06 PM  Result Value Ref Range   Glucose-Capillary 156 (H) 70 - 99 mg/dL  Glucose, capillary     Status: Abnormal   Collection Time: 08/08/20 10:09 PM  Result Value Ref Range   Glucose-Capillary 159 (H) 70 - 99 mg/dL   No results found.   Assessment/Plan: Diagnosis: right occipital brain mass s/p craniotomy and resection. Pt with resultant visual-spatial, cognitive and functional deficits 1. Does the need for close, 24 hr/day medical supervision in concert with the patient's rehab needs make it unreasonable for this patient to be served in a less intensive setting? Yes and  Potentially 2. Co-Morbidities requiring supervision/potential complications: HTN, ETOH use, prior parotid tumor 3. Due to bladder management, bowel management, safety, skin/wound care, disease management, medication administration, pain management and patient education, does the patient require 24 hr/day rehab nursing? Yes and Potentially 4. Does the patient require coordinated care of a physician, rehab nurse, therapy disciplines of PT, OT, SLP to address physical and functional deficits in the context of the above medical diagnosis(es)? Yes and Potentially Addressing deficits in the following areas: balance, endurance, locomotion, strength, transferring, bowel/bladder control, bathing, dressing, feeding, grooming, toileting, cognition and psychosocial support 5. Can the patient actively participate in an intensive therapy program of at least 3 hrs of therapy per day at least 5 days per week? Potentially 6. The potential for patient to make measurable gains while on inpatient rehab is good and fair 7. Anticipated functional outcomes upon discharge from inpatient rehab are min assist  with PT, min assist and mod assist with OT, supervision and min assist with SLP. 8. Estimated rehab length of stay to reach the above functional goals is: potentially 20-27 days 9. Anticipated discharge destination: ?ALF 10. Overall Rehab/Functional Prognosis: fair  RECOMMENDATIONS: This patient's condition is appropriate for continued rehabilitative care in the following setting: CIR if ALF is able to accommodate for the above projected goals. Otherwise SNF Patient has agreed to participate in recommended program. N/A Note that insurance prior authorization may be required for reimbursement for recommended care.  Comment: Rehab Admissions Coordinator to follow up.  Thanks,  Meredith Staggers, MD, Mellody Drown  I have personally performed a face to face diagnostic evaluation of this patient. Additionally, I have  examined pertinent labs and radiographic images. I have reviewed and concur with the physician assistant's documentation above.    Lavon Paganini Angiulli, PA-C 08/09/2020

## 2020-08-10 DIAGNOSIS — G9341 Metabolic encephalopathy: Secondary | ICD-10-CM | POA: Diagnosis not present

## 2020-08-10 DIAGNOSIS — I5032 Chronic diastolic (congestive) heart failure: Secondary | ICD-10-CM | POA: Diagnosis not present

## 2020-08-10 DIAGNOSIS — D496 Neoplasm of unspecified behavior of brain: Secondary | ICD-10-CM | POA: Diagnosis not present

## 2020-08-10 DIAGNOSIS — I10 Essential (primary) hypertension: Secondary | ICD-10-CM | POA: Diagnosis not present

## 2020-08-10 LAB — GLUCOSE, CAPILLARY
Glucose-Capillary: 140 mg/dL — ABNORMAL HIGH (ref 70–99)
Glucose-Capillary: 161 mg/dL — ABNORMAL HIGH (ref 70–99)
Glucose-Capillary: 142 mg/dL — ABNORMAL HIGH (ref 70–99)
Glucose-Capillary: 151 mg/dL — ABNORMAL HIGH (ref 70–99)

## 2020-08-10 NOTE — Progress Notes (Signed)
PROGRESS NOTE    Francisco [name]   PTW:[id number]  DOB: 1945/11/14  DOA: 08/02/2020 PCP: Lesia Hausen, PA   Brief Narrative:  Francisco [name] 74 year old male was at [facility] assisted living with past medical history of seizure disorder, alcohol abuse (in remission), hypertension, hyperlipidemia, celiac disease, diastolic congestive heart failure (EF 55-60% via Echo 2016), history of [place] parotid tumor (excised/radiated 08/2018), bilateral carotid stenosis, gout who presents to [facility] emergency department from his assisted living facility due to witnessed seizures. He had 3 seizures after which EMS was called and he was brought to the hospital. Upon eval in the ED he was found to have a right occipital lobe lesion measuring 6 cm x 4 cm with surrounding edema and evidence of midline shift to the left.  Neurosurgery recommended initiating Decadron.  Neurology recommended a IV Keppra bolus The patient was admitted to the triad hospitalist service. After further history was obtained it was determined that the patient was having weakness and frequent falls over the past couple of weeks.  Also noted was peripheral vision loss.  Subjective: No complaints.     Assessment & Plan:   Principal Problem:   Seizure disorder- provoked seizures likely related to occipital mass -Due to incompatible pacemaker, MRIs cannot be obtained - 9/29- Right Parietal-occipital craniotomy and tumor resection - cont Keppra 1000 mg BID  - on Decadron taper per NS - radiation oncology is aware of him and is arranging follow up - per NS, ok to discharge- CIR is looking into taking him   Active Problems:  Acute encephalopathy (dementia at baseline?) - severe confusion occurring after surgery- he fell out of bed that night - ? If he is in alcohol withdrawal- history suggesting alcohol abuse  - ? If delirium related to steroids, anaesthesia or ICU stay - ? If due to narcotics - briefly on  Precedex on 9/20 -plan: -  tapering steroids  - cont thiamine  - started Seroquel and 1 dose made him quite sleepy and thus I have stopped it. He is no longer agitated but has significant memory deficits which may be his baseline- both his sister are not sure if he has memory issues- he is still able to discuss past events   Chronic diastolic CHF (congestive heart failure) (HCC)  - hold Lasix and follow   Gout - cont allopurinol  DM2 - A1c is 6.9 - hold Metformin- sugars < 200  H/o left parotid cancer - s/p left parotid radiation and parotid surgery  Pacemaker  H/o PUD - cont PPI  Celiac disease  BPH - Flomax   Time spent in minutes: 35 DVT prophylaxis: Lovenox Code Status: DNR Family Communication: sisters Mardene Celeste and [place] Disposition Plan:  Status is: Inpatient  Remains inpatient appropriate because: s/p brain surgery- needs CIR- still confused   Dispo: The patient is from: ALF              Anticipated d/c is to: CIR or SNF              Anticipated d/c date is: when accepted to CIR or SNF              Patient currently is  medically stable to d/c.  Consultants:   NS Procedures:   9/29- Right Parietal-occipital craniotomy and tumor resection  Antimicrobials:  Anti-infectives (From admission, onward)   Start     Dose/Rate Route Frequency Ordered Stop   08/04/20 1930  ceFAZolin (ANCEF) IVPB  1 g/50 mL premix        1 g 100 mL/hr over 30 Minutes Intravenous Every 8 hours 08/04/20 1921 08/05/20 1333   08/04/20 0800  ceFAZolin (ANCEF) IVPB 2g/100 mL premix        2 g 200 mL/hr over 30 Minutes Intravenous To ShortStay Surgical 08/04/20 0713 08/04/20 1237       Objective: Vitals:   08/10/20 0327 08/10/20 0459 08/10/20 0733 08/10/20 1155  BP:   (!) 145/68 (!) 122/55  Pulse:   60 60  Resp:   20 (!) 22  Temp: 98.6 F (37 C)  98.6 F (37 C) 98.4 F (36.9 C)  TempSrc: Oral  Oral Oral  SpO2:   94% 96%  Weight:  74.5 kg    Height:         Intake/Output Summary (Last 24 hours) at 08/10/2020 1545 Last data filed at 08/10/2020 1156 Gross per 24 hour  Intake 240 ml  Output 500 ml  Net -260 ml   Filed Weights   08/04/20 1044 08/09/20 0500 08/10/20 0459  Weight: 87 kg 74.3 kg 74.5 kg    Examination: General exam: Appears comfortable  Respiratory system: Clear to auscultation. Respiratory effort normal. Cardiovascular system: S1 & S2 heard,  No murmurs  Gastrointestinal system: Abdomen soft, non-tender, nondistended. Normal bowel sounds  Extremities: No cyanosis, clubbing or edema Skin: No rashes or ulcers      Data Reviewed: I have personally reviewed following labs and imaging studies  CBC: Recent Labs  Lab 08/04/20 0722 08/04/20 1333 08/04/20 1515 08/05/20 0432 08/06/20 0551 08/07/20 0415 08/09/20 0626  WBC 14.8*  --   --  16.7* 13.6* 15.4* 9.1  NEUTROABS  --   --   --  15.6* 12.4*  --   --   HGB 10.6*   < > 10.2* 9.9* 10.5* 10.4* 10.7*  HCT 30.8*   < > 30.0* 30.0* 30.2* 29.7* 31.2*  MCV 87.0  --   --  88.0 88.3 88.1 87.6  PLT 249  --   --  199 194 172 208   < > = values in this interval not displayed.   Basic Metabolic Panel: Recent Labs  Lab 08/04/20 0722 08/04/20 1333 08/04/20 1515 08/05/20 0432 08/06/20 0551 08/07/20 0415 08/09/20 0626  NA 137   < > 135 136 137 136 136  K 4.1   < > 4.2 3.8 3.9 3.5 3.9  CL 103  --   --  104 104 102 103  CO2 23  --   --  23 24 25 23   GLUCOSE 126*  --   --  154* 144* 149* 142*  BUN 22  --   --  16 20 24* 23  CREATININE 1.22  --   --  0.93 1.04 1.06 0.82  CALCIUM 9.6  --   --  9.2 9.3 9.1 9.2  MG 1.5*  --   --   --   --   --   --    < > = values in this interval not displayed.   GFR: Estimated Creatinine Clearance: 79 mL/min (by C-G formula based on SCr of 0.82 mg/dL). Liver Function Tests: Recent Labs  Lab 08/06/20 0551  AST 24  ALT 15  ALKPHOS 47  BILITOT 0.3  PROT 6.1*  ALBUMIN 3.2*   No results for input(s): LIPASE, AMYLASE in the last  168 hours. No results for input(s): AMMONIA in the last 168 hours. Coagulation Profile: No results for input(s):  INR, PROTIME in the last 168 hours. Cardiac Enzymes: No results for input(s): CKTOTAL, CKMB, CKMBINDEX, TROPONINI in the last 168 hours. BNP (last 3 results) No results for input(s): PROBNP in the last 8760 hours. HbA1C: No results for input(s): HGBA1C in the last 72 hours. CBG: Recent Labs  Lab 08/09/20 1125 08/09/20 1545 08/09/20 2104 08/10/20 0732 08/10/20 1154  GLUCAP 187* 164* 169* 140* 161*   Lipid Profile: No results for input(s): CHOL, HDL, LDLCALC, TRIG, CHOLHDL, LDLDIRECT in the last 72 hours. Thyroid Function Tests: No results for input(s): TSH, T4TOTAL, FREET4, T3FREE, THYROIDAB in the last 72 hours. Anemia Panel: No results for input(s): VITAMINB12, FOLATE, FERRITIN, TIBC, IRON, RETICCTPCT in the last 72 hours. Urine analysis:    Component Value Date/Time   COLORURINE YELLOW 08/02/2020 [place] 08/02/2020 2208   LABSPEC 1.009 08/02/2020 2208   PHURINE 5.0 08/02/2020 2208   GLUCOSEU NEGATIVE 08/02/2020 2208   HGBUR NEGATIVE 08/02/2020 2208   BILIRUBINUR NEGATIVE 08/02/2020 2208   KETONESUR NEGATIVE 08/02/2020 2208   PROTEINUR NEGATIVE 08/02/2020 2208   UROBILINOGEN 0.2 08/13/2014 1010   NITRITE NEGATIVE 08/02/2020 2208   LEUKOCYTESUR NEGATIVE 08/02/2020 2208   Sepsis Labs: @LABRCNTIP (procalcitonin:4,lacticidven:4) ) Recent Results (from the past 240 hour(s))  Respiratory Panel by RT PCR (Flu A&B, Covid) - Urine, Clean Catch     Status: None   Collection Time: 08/02/20 10:08 PM   Specimen: Urine, Clean Catch; Nasopharyngeal  Result Value Ref Range Status   SARS Coronavirus 2 by RT PCR NEGATIVE NEGATIVE Final    Comment: (NOTE) SARS-CoV-2 target nucleic acids are NOT DETECTED.  The SARS-CoV-2 RNA is generally detectable in upper respiratoy specimens during the acute phase of infection. The lowest concentration of SARS-CoV-2  viral copies this assay can detect is 131 copies/mL. A negative result does not preclude SARS-Cov-2 infection and should not be used as the sole basis for treatment or other patient management decisions. A negative result may occur with  improper specimen collection/handling, submission of specimen other than nasopharyngeal swab, presence of viral mutation(s) within the areas targeted by this assay, and inadequate number of viral copies (<131 copies/mL). A negative result must be combined with clinical observations, patient history, and epidemiological information. The expected result is Negative.  Fact Sheet for Patients:  PinkCheek.be  Fact Sheet for Healthcare Providers:  GravelBags.it  This test is no t yet approved or cleared by the [place] FDA and  has been authorized for detection and/or diagnosis of SARS-CoV-2 by FDA under an Emergency Use Authorization (EUA). This EUA will remain  in effect (meaning this test can be used) for the duration of the COVID-19 declaration under Section 564(b)(1) of the Act, 21 U.S.C. section 360bbb-3(b)(1), unless the authorization is terminated or revoked sooner.     Influenza A by PCR NEGATIVE NEGATIVE Final   Influenza B by PCR NEGATIVE NEGATIVE Final    Comment: (NOTE) The Xpert Xpress SARS-CoV-2/FLU/RSV assay is intended as an aid in  the diagnosis of influenza from Nasopharyngeal swab specimens and  should not be used as a sole basis for treatment. Nasal washings and  aspirates are unacceptable for Xpert Xpress SARS-CoV-2/FLU/RSV  testing.  Fact Sheet for Patients: PinkCheek.be  Fact Sheet for Healthcare Providers: GravelBags.it  This test is not yet approved or cleared by the [place] FDA and  has been authorized for detection and/or diagnosis of SARS-CoV-2 by  FDA under an Emergency Use Authorization (EUA).  This EUA will remain  in effect (  meaning this test can be used) for the duration of the  Covid-19 declaration under Section 564(b)(1) of the Act, 21  U.S.C. section 360bbb-3(b)(1), unless the authorization is  terminated or revoked. Performed at [facility], [place] [address], [place], [place] [address]   MRSA PCR Screening     Status: None   Collection Time: 08/03/20  6:09 AM   Specimen: Nasal Mucosa; Nasopharyngeal  Result Value Ref Range Status   MRSA by PCR NEGATIVE NEGATIVE Final    Comment:        The GeneXpert MRSA Assay (FDA approved for NASAL specimens only), is one component of a comprehensive MRSA colonization surveillance program. It is not intended to diagnose MRSA infection nor to guide or monitor treatment for MRSA infections. Performed at [facility], [place] [address], [place], [place] [address]   Surgical pcr screen     Status: None   Collection Time: 08/04/20  7:44 AM   Specimen: Nasal Mucosa; Nasal Swab  Result Value Ref Range Status   MRSA, PCR NEGATIVE NEGATIVE Final   Staphylococcus aureus NEGATIVE NEGATIVE Final    Comment: (NOTE) The Xpert SA Assay (FDA approved for NASAL specimens in patients 59 years of age and older), is one component of a comprehensive surveillance program. It is not intended to diagnose infection nor to guide or monitor treatment. Performed at [facility], [place] [address], [place], [place] [address]   Aerobic/Anaerobic Culture (surgical/deep wound)     Status: None   Collection Time: 08/04/20  2:03 PM   Specimen: PATH Other; Tissue  Result Value Ref Range Status   Specimen Description TISSUE  Final   Special Requests RIGHT PARIETAL OCCIPITAL MASS SPEC A  Final   Gram Stain   Final    FEW WBC PRESENT, PREDOMINANTLY MONONUCLEAR NO ORGANISMS SEEN    Culture   Final    No growth aerobically or anaerobically. Performed at [facility], [place] [address] [place], [place] [address]    Report  Status 08/09/2020 FINAL  Final         Radiology Studies: No results found.    Scheduled Meds: . allopurinol  300 mg Oral Daily  . atorvastatin  20 mg Oral Daily  . Chlorhexidine Gluconate Cloth  6 each Topical Daily  . dexamethasone  2 mg Oral Q8H   Followed by  . [START ON 08/11/2020] dexamethasone  2 mg Oral Q12H   Followed by  . [START ON 08/13/2020] dexamethasone  1 mg Oral Q12H   Followed by  . [START ON 08/15/2020] dexamethasone  1 mg Oral Daily  . docusate sodium  100 mg Oral BID  . enoxaparin (LOVENOX) injection  40 mg Subcutaneous Q24H  . folic acid  1 mg Oral Daily  . insulin aspart  0-15 Units Subcutaneous TID WC  . levETIRAcetam  750 mg Oral BID  . lisinopril  20 mg Oral Daily  . metoprolol tartrate  75 mg Oral BID  . nicotine  7 mg Transdermal Daily  . pantoprazole  40 mg Oral BID AC  . tamsulosin  0.4 mg Oral Daily  . thiamine  100 mg Oral Daily  . vitamin B-12  500 mcg Oral Daily   Continuous Infusions:    LOS: 7 days      Debbe Odea, MD [facility] Pager: [email or web address] 08/10/2020, 3:45 PM

## 2020-08-10 NOTE — Progress Notes (Signed)
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

## 2020-08-10 NOTE — TOC Initial Note (Signed)
Transition of Care [facility]) - Initial/Assessment Note    Patient Details  Name: Francisco [name] MRN: [id number] Date of Birth: 11/23/1945  [facility]) CM/SW Contact:    Vinie Sill, [place] Phone Number: 08/10/2020, 2:56 PM  Clinical Narrative:                 CSW  Visit with patient at bedside. CSW introduced self and explained role. CSW discussed short term rehab at SNF with patient. Patient states she was agreeable, but also requested CSW to talk with his sisters Lisbeth Ply and or Mardene Celeste.   CSW spoke with [name]. She states she is his POA but they have an estranged relationship. She express she only can assist in signing for procedures or needed paperwork for patient. She is agreeable to send SNF referrals. She agrees with the plan of short term rehab at [facility] before returning back to [facility]. CSW explained she will need to sign his admission paperwork.   CSW will provide bed offers once available.  CSW will continue to follow and assist with discharge planning.  Thurmond Butts, MSW, [place] Clinical Social Worker   Expected Discharge Plan: Skilled Nursing Facility Barriers to Discharge: Continued Medical Work up, SNF Pending bed offer, Insurance Authorization   Patient Goals and CMS Choice        Expected Discharge Plan and Services Expected Discharge Plan: [place] In-house Referral: Clinical Social Work     Living arrangements for the past 2 months: [place]                                      Prior Living Arrangements/Services Living arrangements for the past 2 months: [place] Lives with:: Facility Resident Patient language and need for interpreter reviewed:: No        Need for Family Participation in Patient Care: Yes (Comment) Care giver support system in place?: Yes (comment)   Criminal Activity/Legal Involvement Pertinent to Current Situation/Hospitalization: No - Comment as  needed  Activities of Daily Living      Permission Sought/Granted Permission sought to share information with : Family Supports Permission granted to share information with : Yes, Verbal Permission Granted  Share Information with NAME: [name] Rebak  Permission granted to share info w AGENCY: SNFs  Permission granted to share info w Relationship: sister  Permission granted to share info w Contact Information: [phone]  Emotional Assessment Appearance:: Appears stated age Attitude/Demeanor/Rapport: Engaged Affect (typically observed): Appropriate, Calm, Accepting Orientation: : Oriented to Self   Psych Involvement: No (comment)  Admission diagnosis:  Seizure ([place]) [R56.9] Brain mass [G93.89] Seizure with provoking factor ([place]) [R56.9] Seizures ([place]) [R56.9] Patient Active Problem List   Diagnosis Date Noted  . Neoplasm of brain causing mass effect on adjacent structures ([place]) 08/03/2020  . Seizure disorder ([place]) 08/03/2020  . Mixed hyperlipidemia due to type 2 diabetes mellitus ([place]) 08/03/2020  . Type 2 diabetes mellitus without complication, with long-term current use of insulin ([place]) 08/03/2020  . Bilateral carotid artery stenosis 08/03/2020  . Seizure with provoking factor ([place]) 08/02/2020  . Cancer of parotid gland ([place]) 09/25/2018  . Merkel cell carcinoma of neck ([place]) 09/25/2018  . Parotid mass 08/21/2018  . Carotid disease, bilateral ([place]) 03/20/2018  . Hyponatremia 12/02/2017  . Falls 12/01/2017  . History of alcohol abuse   . Chest pain 08/14/2016  . Seizure ([place])   .  Gout 12/17/2015  . Convulsions ([place]) 08/17/2015  . Gait instability 08/17/2015  . Alcoholism /alcohol abuse 08/17/2015  . Cardiac device in situ, other   . Elevated troponin   . Cardiac arrest ([place])   . Complete heart block ([place])   . Seizures ([place])   . Chronic diastolic CHF (congestive heart failure) ([place])   . HLD (hyperlipidemia)   . Alcohol withdrawal ([place])   . Acute renal failure  syndrome ([place])   . Hypokalemia   . Hypomagnesemia   . Absolute anemia   . Hypertrophic cardiomyopathy ([place])   . Diastolic CHF, acute on chronic (HCC)   . Essential hypertension   . Alcohol withdrawal seizure ([place]) 06/29/2015  . 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 Mild diastolic dysfunction 41/42/3953  . Acute pain of right knee 06/29/2015  . Transaminitis 06/29/2015  . Acute renal failure ([place]) 06/29/2015  . High anion gap metabolic acidosis 20/23/3435  . Acute hypokalemia 06/29/2015  . Alcohol abuse, daily use 06/10/2015  . Hyperglycemia 06/09/2015  . Vitamin D deficiency 06/09/2015  . Hyperlipidemia 06/09/2015  . Bilateral lower extremity edema (chronic) 06/09/2015  . BPH (benign prostatic hypertrophy) 08/13/2014  . Multiple duodenal ulcers 07/03/2012  . Chest pain on exertion 07/02/2012  . GIB (gastrointestinal bleeding) 07/02/2012  . GERD (gastroesophageal reflux disease)   . Heart murmur   . Microcytic anemia 06/14/2012  . Generalized weakness 06/13/2012  . Celiac disease 06/13/2012  . HTN (hypertension) 06/13/2012  . Acute hyponatremia 06/13/2012   PCP:  Lesia Hausen, PA Pharmacy:  No Pharmacies Listed    Social Determinants of Health (SDOH) Interventions    Readmission Risk Interventions No flowsheet data found.

## 2020-08-10 NOTE — Progress Notes (Signed)
Inpatient Rehab Admissions Coordinator:   I met with patient at the bedside.  He is sitting up and much more alert, though processing time still appears to be slow.  He does agree that he is in need of some physical rehabilitation, however he cannot tell me much about his prior living environment (per chart review he was a resident of [facility]), or what sort of support is available to him there, or whether he could even return at all.  I spoke to his sister, Lisbeth Ply, on the phone.  [name] reports that she is his POA but that she and pt are estranged.  She states that the plan is for pt to return to [facility] following rehab, but is also unsure what level pt would need to achieve functionally in order to return.  I called [facility] to attempt to speak with someone, and after several transfers, was able to leave a HIPAA compliant message on an unidentified voicemail.    As per consult, pt would likely need min to mod physical assist at discharge from CIR and we will need to confirm that [facility] is prepared to provide this for patient because his [facility] will not cover CIR and then SNF subsequently.    Shann Medal, PT, DPT Admissions Coordinator [phone] 08/10/20  2:14 PM

## 2020-08-10 NOTE — Progress Notes (Signed)
Inpatient Rehab Admissions Coordinator:   Spoke with Thurmond Butts, LCSWA and family okay with transition to SNF.  Have not heard back from [facility].  Will sign off for now.    Shann Medal, PT, DPT Admissions Coordinator [phone] 08/10/20  3:55 PM

## 2020-08-10 NOTE — Care Management Important Message (Signed)
Important Message  Patient Details  Name: Francisco [name] MRN: [id number] Date of Birth: 01/25/1946   Medicare Important Message Given:  Yes Due to Patient condition he is not able to sign.  Singed copy Patient Bedside.    Demonica Farrey 08/10/2020, 4:14 PM

## 2020-08-10 NOTE — Progress Notes (Signed)
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 Pt also reports progressive LE weakness with multiple falls over the past weeks as well as vision changes. CT head reveals new R occipital lesion with surrounding edema and evidence of midline shift. Pt underwent R parietal occipital craniotomy and tumor excision on 08/04/2020.   OT comments  Patient continues to make steady progress towards goals in skilled OT session. Patient's session encompassed bed mobility, further assessment of vision, and bed-level ADLs. Pt with increased orientation to date, and able to follow one step commands for bed mobility and bed-level exercises. Pt with also noted to be able to track past midline on L when assessed, however demonstrates no convergence or ability to complete saccades when tested. OOB deferred due to therapist not having any help and pt soiled upon arrival. Discharge remains appropriate, will continue to follow acutely.    Follow Up Recommendations  SNF;Supervision/Assistance - 24 hour    Equipment Recommendations  None recommended by OT    Recommendations for Other Services      Precautions / Restrictions Precautions Precautions: Fall Precaution Comments: Impuslive; fall out of recliner during admission; large L neglect/field cut Restrictions Weight Bearing Restrictions: No       Mobility Bed Mobility Overal bed mobility: Needs Assistance   Rolling: Mod assist          General bed mobility comments: max cues, increased time, assist for reaching to R with L UE to grab rail  Transfers                 General transfer comment: deferred    Balance                                           ADL either performed or assessed with clinical judgement   ADL Overall ADL's : Needs assistance/impaired     Grooming: Bed level;Wash/dry hands;Wash/dry face;Set up                               Functional mobility during ADLs: Maximal assistance;+2 for physical assistance;+2 for safety/equipment;Cueing for safety;Cueing for sequencing General ADL Comments: ADL participation limited by left neglect     Vision   Saccades: Impaired - to be further tested in functional context Visual Fields: Left visual field deficit;Impaired-to be further tested in functional context Additional Comments: Continued L neglect, but able to track laterally to L with jerky movement well past midline, no convergence noted when tested   Perception     Praxis      Cognition Arousal/Alertness: Awake/alert Behavior During Therapy: Flat affect Overall Cognitive Status: Impaired/Different from baseline Area of Impairment: Orientation;Attention;Memory;Following commands;Safety/judgement;Awareness;Problem solving                 Orientation Level: Disoriented to;Time Current Attention Level: Sustained Memory: Decreased short-term memory;Decreased recall of precautions Following Commands: Follows one step commands with increased time Safety/Judgement: Decreased awareness of  deficits Awareness: Emergent Problem Solving: Slow processing;Requires verbal cues;Requires tactile cues;Difficulty sequencing General Comments: Pt with increased orientation, but requires max multi-modal cues in order to maintain attention to task        Exercises General Exercises - Lower Extremity Ankle Circles/Pumps: Both;15 reps;Supine;AROM Straight Leg  Raises: AROM;Both;10 reps   Shoulder Instructions       General Comments      Pertinent Vitals/ Pain       Pain Assessment: Faces Faces Pain Scale: Hurts a little bit Pain Location: Generalized Pain Descriptors / Indicators: Sore;Guarding Pain Intervention(s): Limited activity within patient's tolerance;Monitored during session  Home Living                                          Prior Functioning/Environment              Frequency           Progress Toward Goals  OT Goals(current goals can now be found in the care plan section)  Progress towards OT goals: Progressing toward goals  Acute Rehab OT Goals Patient Stated Goal: to improve balance OT Goal Formulation: With patient Time For Goal Achievement: 08/19/20 Potential to Achieve Goals: Good  Plan Discharge plan remains appropriate    Co-evaluation                 AM-PAC OT "6 Clicks" Daily Activity     Outcome Measure   Help from another person eating meals?: A Lot Help from another person taking care of personal grooming?: A Lot Help from another person toileting, which includes using toliet, bedpan, or urinal?: Total Help from another person bathing (including washing, rinsing, drying)?: A Lot Help from another person to put on and taking off regular upper body clothing?: A Lot Help from another person to put on and taking off regular lower body clothing?: Total 6 Click Score: 10    End of Session Equipment Utilized During Treatment: Other (comment)  OT Visit Diagnosis: Unsteadiness on feet (R26.81);Other abnormalities of gait and mobility (R26.89);Muscle weakness (generalized) (M62.81);Other symptoms and signs involving cognitive function;Other symptoms and signs involving the nervous system (R29.898);History of falling (Z91.81);Pain   Activity Tolerance Patient tolerated treatment well   Patient Left in bed;with call bell/phone within reach;with bed alarm set;with  nursing/sitter in room   Nurse Communication Mobility status        Time: 2703-5009 OT Time Calculation (min): 18 min  Charges: OT General Charges $OT Visit: 1 Visit OT Treatments $Self Care/Home Management : 8-22 mins  Corinne [place]. [place], [place] Acute Rehabilitation Services [phone] [place] 08/10/2020, 1:42 PM

## 2020-08-11 DIAGNOSIS — I10 Essential (primary) hypertension: Secondary | ICD-10-CM | POA: Diagnosis not present

## 2020-08-11 LAB — GLUCOSE, CAPILLARY
Glucose-Capillary: 125 mg/dL — ABNORMAL HIGH (ref 70–99)
Glucose-Capillary: 110 mg/dL — ABNORMAL HIGH (ref 70–99)
Glucose-Capillary: 158 mg/dL — ABNORMAL HIGH (ref 70–99)
Glucose-Capillary: 162 mg/dL — ABNORMAL HIGH (ref 70–99)

## 2020-08-11 MED ORDER — HALOPERIDOL LACTATE 5 MG/ML IJ SOLN
1.0000 mg | Freq: Four times a day (QID) | INTRAMUSCULAR | Status: DC | PRN
  Administered 2020-08-11 – 2020-08-14 (×2): 1 mg via INTRAVENOUS
  Filled 2020-08-11 (×2): qty 1

## 2020-08-11 NOTE — TOC Progression Note (Signed)
Transition of Care [facility]) - Progression Note    Patient Details  Name: Francisco [name] MRN: [id number] Date of Birth: Oct 16, 1946  Transition of Care [name]) CM/SW [place], [place] Work Phone Number: 08/11/2020, 4:02 PM  Clinical Narrative:    CSW intern contacted the patient's POA and gave her the patients SNF bed offers ([facility] [place], [facility], [facility], [place], [facility] [place], and [facility]). After considering the ratings and location of each of the facilities the POA chose [facility].      Expected Discharge Plan: [place] Barriers to Discharge: Continued Medical Work up, SNF Pending bed offer, Ship broker  Expected Discharge Plan and Services Expected Discharge Plan: [place] In-house Referral: Clinical Social Work     Living arrangements for the past 2 months: [place]                                       Social Determinants of Health (SDOH) Interventions    Readmission Risk Interventions No flowsheet data found.

## 2020-08-11 NOTE — Progress Notes (Signed)
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 Pt also reports progressive LE weakness with multiple falls over the past weeks as well as vision changes. CT head reveals new R occipital lesion with surrounding edema and evidence of midline shift. Pt underwent R parietal occipital craniotomy and tumor excision on 08/04/2020.    PT Comments    Patient progressing with ambulation this session and able to attend to L side with max cues.  He was able to demonstrate improved endurance as well as just finished washing up with nursing and back to bed from recliner, but able to get up again to walk with PT.  Agree that he is more appropriate for SNF level rehab at d/c. PT to continue to follow.    Follow Up Recommendations  SNF     Equipment Recommendations  Wheelchair (measurements PT);Wheelchair cushion (measurements PT)    Recommendations for Other Services       Precautions / Restrictions Precautions Precautions: Fall Precaution Comments: Impuslive; fall out of recliner during admission; large L neglect/field cut    Mobility  Bed Mobility Overal bed mobility: Needs Assistance Bed Mobility: Rolling;Sidelying to Sit;Sit to Supine Rolling: Mod assist Sidelying to sit: Mod assist   Sit to supine: Mod assist;+2 for safety/equipment   General bed mobility comments: assist for safety and cues for balance, L side awareness/use  Transfers Overall transfer level: Needs  assistance Equipment used: Rolling walker (2 wheeled) Transfers: Sit to/from Stand Sit to Stand: Mod assist;+2 physical assistance         General transfer comment: up from EOB to RW assist for balance/safety, walker management  Ambulation/Gait Ambulation/Gait assistance: Mod assist;+2 physical assistance Gait Distance (Feet): 10 Feet (15') Assistive device: Rolling walker (2 wheeled) Gait Pattern/deviations: Step-to pattern;Decreased stride length;Shuffle;Decreased weight shift to right;Wide base of support     General Gait Details: cues, facilitation for R lateral weight shift and for increased L step length, cues and assist for negotiating around obstacles on L Side as well.   Stairs             Wheelchair Mobility    Modified Rankin (Stroke Patients Only)       Balance Overall balance assessment: Needs assistance Sitting-balance support: Feet supported Sitting balance-Leahy Scale: Fair Sitting balance - Comments: S for sitting EOB     Standing balance-Leahy Scale: Poor Standing balance comment: cues for R lateral lean for balance and to attend to L side, L hand placed on RW due to keeping it down on rail                            Cognition Arousal/Alertness: Awake/alert Behavior During Therapy: Impulsive Overall Cognitive Status: Impaired/Different from baseline Area of Impairment: Attention;Memory;Following commands;Safety/judgement;Problem solving                   Current Attention Level: Sustained Memory: Decreased short-term memory;Decreased recall of precautions Following Commands: Follows one step commands with increased time Safety/Judgement: Decreased awareness of deficits   Problem Solving: Slow processing;Requires verbal cues;Requires  tactile cues;Difficulty sequencing General Comments: Needs cues for redirection to task, focused on calling his friend and comments about PT/tech's breasts      Exercises      General  Comments General comments (skin integrity, edema, etc.): VSS on RA      Pertinent Vitals/Pain Pain Assessment: No/denies pain    Home Living                      Prior Function            PT Goals (current goals can now be found in the care plan section) Progress towards PT goals: Progressing toward goals    Frequency    Min 3X/week      PT Plan Discharge plan needs to be updated;Frequency needs to be updated    Co-evaluation              AM-PAC PT "6 Clicks" Mobility   Outcome Measure  Help needed turning from your back to your side while in a flat bed without using bedrails?: A Lot Help needed moving from lying on your back to sitting on the side of a flat bed without using bedrails?: A Lot Help needed moving to and from a bed to a chair (including a wheelchair)?: A Lot Help needed standing up from a chair using your arms (e.g., wheelchair or bedside chair)?: A Lot Help needed to walk in hospital room?: Total Help needed climbing 3-5 steps with a railing? : Total 6 Click Score: 10    End of Session Equipment Utilized During Treatment: Gait belt Activity Tolerance: Patient limited by fatigue Patient left: in bed;with bed alarm set;with call bell/phone within reach   PT Visit Diagnosis: Unsteadiness on feet (R26.81);Other abnormalities of gait and mobility (R26.89);Muscle weakness (generalized) (M62.81);Repeated falls (R29.6);Other symptoms and signs involving the nervous system (Y81.448)     Time: 1856-3149 PT Time Calculation (min) (ACUTE ONLY): 23 min  Charges:  $Gait Training: 23-37 mins                     Magda Kiel, PT Acute Rehabilitation Services Pager:[phone] [phone] 08/11/2020    Reginia Naas 08/11/2020, 12:32 PM

## 2020-08-11 NOTE — TOC Progression Note (Signed)
Transition of Care [facility]) - Progression Note    Patient Details  Name: Francisco [name] MRN: [id number] Date of Birth: 1946-04-20  Transition of Care [facility]) CM/SW [place], [place] Phone Number: 08/11/2020, 5:33 PM  Clinical Narrative:     [facility] confirmed bed offer- patient will need insurance authorization & Covid  before discharge to SNF.  CSW started Lex[employer] #  [id number]. -waiting on approval CSW will continue to follow and assist with discharge planning.   Thurmond Butts, MSW, [place] Clinical Social Worker   Expected Discharge Plan: Skilled Nursing Facility Barriers to Discharge: Continued Medical Work up, SNF Pending bed offer, Insurance Authorization  Expected Discharge Plan and Services Expected Discharge Plan: [place] In-house Referral: Clinical Social Work     Living arrangements for the past 2 months: [place]                                       Social Determinants of Health (SDOH) Interventions    Readmission Risk Interventions No flowsheet data found.

## 2020-08-11 NOTE — Progress Notes (Signed)
Neurosurgery  NAEs o/n  Awake, alert, oriented to person, place, month.  Cognitive deficits noted. Left Visual field cut. Incision c/d  Metastatic Merkel cell CA s/p craniotomy - awaiting SNF - will likely need XRT postop, possibly WBRT

## 2020-08-11 NOTE — Progress Notes (Signed)
°  Speech Language Pathology Treatment: Cognitive-Linquistic  Patient Details Name: Francisco [name] MRN: [id number] DOB: [birth year] Today's Date: 08/11/2020 Time: 3646-8032 SLP Time Calculation (min) (ACUTE ONLY): 20 min  Assessment / Plan / Recommendation Clinical Impression  Pt needs Mod cues for command following during functional tasks and bed mobility, with Max faded to Mod cues to attend to his L side. He's disoriented to location despite efforts at reorientation and does not show delayed recall, although after a brief delay he at least thought that he was in a medical setting (his doctor's office). Pt participated in a verbal sequencing task well but until asked an additional question, at which time his attention broke. He will continue to benefit from SLP services - f/u recommendations changed to SNF.   HPI HPI: 74 year old male presented to the ED 9/27 from ALF with a seizure. 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  Dx large 6 cm mass in right parietal lobe and occipital lobe that was also involving the corpus callosum.  There was significant mass-effect. Underwent craniotomy and tumor excision 9/29.      SLP Plan  Continue with current plan of care       Recommendations                   Follow up Recommendations: Skilled Nursing facility SLP Visit Diagnosis: Cognitive communication deficit (Z22.482) Plan: Continue with current plan of care       GO                Osie Bond., M.A. [place] Acute Rehabilitation Services Pager [phone] Office [phone]  08/11/2020, 5:06 PM

## 2020-08-11 NOTE — Plan of Care (Signed)
  Problem: Education: Goal: Knowledge of General Education information will improve Description: Including pain rating scale, medication(s)/side effects and non-pharmacologic comfort measures Outcome: Progressing   Problem: Clinical Measurements: Goal: Ability to maintain clinical measurements within normal limits will improve Outcome: Progressing   Problem: Clinical Measurements: Goal: Will remain free from infection Outcome: Progressing   Problem: Nutrition: Goal: Adequate nutrition will be maintained Outcome: Progressing

## 2020-08-11 NOTE — Progress Notes (Signed)
PROGRESS NOTE    Francisco [name]  GHW:[id number] DOB: 20-Apr-1946 DOA: 08/02/2020 PCP: Lesia Hausen, PA   Brief Narrative: 74 year old male was at [facility] assisted living with past medical history of seizure disorder, alcohol abuse(in remission), hypertension, hyperlipidemia, celiac disease, diastolic congestive heart failure(EF 55-60% via Echo 2016),history of [place] parotid tumor(excised/radiated 08/2018),bilateral carotid stenosis,gout who presents to [facility] emergency department from his assisted living facility due to witnessed seizures. He had 3 seizures after which EMS was called and he was brought to the hospital. Upon eval in the ED he was found to have a right occipital lobe lesion measuring 6 cm x 4 cm with surrounding edema and evidence of midline shift to the left.  Neurosurgery recommended initiating Decadron.  Neurology recommended a IV Keppra bolus The patient was admitted to the triad hospitalist service. After further history was obtained it was determined that the patient was having weakness and frequent falls over the past couple of weeks.  Also noted was peripheral vision loss.   Assessment & Plan:   Principal Problem:   Seizure with provoking factor ([place]) Active Problems:   Celiac disease   Essential hypertension   Chronic diastolic CHF (congestive heart failure) (HCC)   Seizures (HCC)   History of alcohol abuse   Neoplasm of brain causing mass effect on adjacent structures (HCC)   Seizure disorder ([place])   Mixed hyperlipidemia due to type 2 diabetes mellitus ([place])   Type 2 diabetes mellitus without complication, with long-term current use of insulin ([place])   Bilateral carotid artery stenosis  Seizure disorder- provoked seizures likely related to occipital mass -Due to incompatible pacemaker, MRIs cannot be obtained - 9/29- Right Parietal-occipital craniotomy and tumor resection-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 If he is in alcohol withdrawal- history suggesting alcohol abuse  - ? If delirium related to steroids, anaesthesia or ICU stay - ? 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   Weight as of this encounter: 73.8 kg.  DVT prophylaxis: lovenox Code Status: dnr Family Communication: none at bed side Disposition Plan:  Status is: Inpatient  Dispo: The patient is from: ALF              Anticipated d/c is to: SNF              Anticipated d/c date is: unknown              Patient currently is medically stable to d/c.   Consultants: ns  Procedures: Right parietal occipital craniotomy and tumor resection on 08/04/2020 Antimicrobials:  Anti-infectives (  From admission, onward)   Start     Dose/Rate Route Frequency Ordered Stop   08/04/20 1930  ceFAZolin (ANCEF) IVPB 1 g/50 mL premix        1 g 100 mL/hr over 30 Minutes Intravenous Every 8 hours 08/04/20 1921 08/05/20 1333   08/04/20 0800  ceFAZolin (ANCEF) IVPB 2g/100 mL premix        2 g 200 mL/hr over 30 Minutes Intravenous To ShortStay Surgical 08/04/20 0713 08/04/20 1237     Subjective: Confused trying to  climb out of bed  Objective: Vitals:   08/11/20 0500 08/11/20 0621 08/11/20 0736 08/11/20 1128  BP:  (!) 157/73 118/71 122/67  Pulse:  73 85 71  Resp:   11 17  Temp:  97.9 F (36.6 C) 99.4 F (37.4 C) 98.8 F (37.1 C)  TempSrc:  Oral Oral Oral  SpO2:   99% 99%  Weight: 73.8 kg     Height:        Intake/Output Summary (Last 24 hours) at 08/11/2020 1627 Last data filed at 08/11/2020 1321 Gross per 24 hour  Intake 475 ml  Output --  Net 475 ml   Filed Weights   08/09/20 0500 08/10/20 0459 08/11/20 0500  Weight: 74.3 kg 74.5 kg 73.8 kg    Examination:  General exam: Appears calm and comfortable  Respiratory system: Clear to auscultation. Respiratory effort normal. Cardiovascular system: S1 & S2 heard, RRR. No JVD, murmurs, rubs, gallops or clicks. No pedal edema. Gastrointestinal system: Abdomen is nondistended, soft and nontender. No organomegaly or masses felt. Normal bowel sounds heard. Central nervous system: Alert and oriented. No focal neurological deficits. Extremities: Symmetric 5 x 5 power. Skin: No rashes, lesions or ulcers Psychiatry: Unable to asses  Data Reviewed: I have personally reviewed following labs and imaging studies  CBC: Recent Labs  Lab 08/05/20 0432 08/06/20 0551 08/07/20 0415 08/09/20 0626  WBC 16.7* 13.6* 15.4* 9.1  NEUTROABS 15.6* 12.4*  --   --   HGB 9.9* 10.5* 10.4* 10.7*  HCT 30.0* 30.2* 29.7* 31.2*  MCV 88.0 88.3 88.1 87.6  PLT 199 194 172 756   Basic Metabolic Panel: Recent Labs  Lab 08/05/20 0432 08/06/20 0551 08/07/20 0415 08/09/20 0626  NA 136 137 136 136  K 3.8 3.9 3.5 3.9  CL 104 104 102 103  CO2 23 24 25 23   GLUCOSE 154* 144* 149* 142*  BUN 16 20 24* 23  CREATININE 0.93 1.04 1.06 0.82  CALCIUM 9.2 9.3 9.1 9.2   GFR: Estimated Creatinine Clearance: 79 mL/min (by C-G formula based on SCr of 0.82 mg/dL). Liver Function Tests: Recent Labs  Lab 08/06/20 0551  AST 24  ALT 15  ALKPHOS 47  BILITOT 0.3  PROT  6.1*  ALBUMIN 3.2*   No results for input(s): LIPASE, AMYLASE in the last 168 hours. No results for input(s): AMMONIA in the last 168 hours. Coagulation Profile: No results for input(s): INR, PROTIME in the last 168 hours. Cardiac Enzymes: No results for input(s): CKTOTAL, CKMB, CKMBINDEX, TROPONINI in the last 168 hours. BNP (last 3 results) No results for input(s): PROBNP in the last 8760 hours. HbA1C: No results for input(s): HGBA1C in the last 72 hours. CBG: Recent Labs  Lab 08/10/20 1650 08/10/20 2057 08/11/20 0734 08/11/20 1124 08/11/20 1613  GLUCAP 142* 151* 125* 110* 158*   Lipid Profile: No results for input(s): CHOL, HDL, LDLCALC, TRIG, CHOLHDL, LDLDIRECT in the last 72 hours. Thyroid Function Tests: No results for input(s): TSH,  T4TOTAL, FREET4, T3FREE, THYROIDAB in the last 72 hours. Anemia Panel: No results for input(s): VITAMINB12, FOLATE, FERRITIN, TIBC, IRON, RETICCTPCT in the last 72 hours. Sepsis Labs: No results for input(s): PROCALCITON, LATICACIDVEN in the last 168 hours.  Recent Results (from the past 240 hour(s))  Respiratory Panel by RT PCR (Flu A&B, Covid) - Urine, Clean Catch     Status: None   Collection Time: 08/02/20 10:08 PM   Specimen: Urine, Clean Catch; Nasopharyngeal  Result Value Ref Range Status   SARS Coronavirus 2 by RT PCR NEGATIVE NEGATIVE Final    Comment: (NOTE) SARS-CoV-2 target nucleic acids are NOT DETECTED.  The SARS-CoV-2 RNA is generally detectable in upper respiratoy specimens during the acute phase of infection. The lowest concentration of SARS-CoV-2 viral copies this assay can detect is 131 copies/mL. A negative result does not preclude SARS-Cov-2 infection and should not be used as the sole basis for treatment or other patient management decisions. A negative result may occur with  improper specimen collection/handling, submission of specimen other than nasopharyngeal swab, presence of viral mutation(s) within  the areas targeted by this assay, and inadequate number of viral copies (<131 copies/mL). A negative result must be combined with clinical observations, patient history, and epidemiological information. The expected result is Negative.  Fact Sheet for Patients:  PinkCheek.be  Fact Sheet for Healthcare Providers:  GravelBags.it  This test is no t yet approved or cleared by the [place] FDA and  has been authorized for detection and/or diagnosis of SARS-CoV-2 by FDA under an Emergency Use Authorization (EUA). This EUA will remain  in effect (meaning this test can be used) for the duration of the COVID-19 declaration under Section 564(b)(1) of the Act, 21 U.S.C. section 360bbb-3(b)(1), unless the authorization is terminated or revoked sooner.     Influenza A by PCR NEGATIVE NEGATIVE Final   Influenza B by PCR NEGATIVE NEGATIVE Final    Comment: (NOTE) The Xpert Xpress SARS-CoV-2/FLU/RSV assay is intended as an aid in  the diagnosis of influenza from Nasopharyngeal swab specimens and  should not be used as a sole basis for treatment. Nasal washings and  aspirates are unacceptable for Xpert Xpress SARS-CoV-2/FLU/RSV  testing.  Fact Sheet for Patients: PinkCheek.be  Fact Sheet for Healthcare Providers: GravelBags.it  This test is not yet approved or cleared by the [place] FDA and  has been authorized for detection and/or diagnosis of SARS-CoV-2 by  FDA under an Emergency Use Authorization (EUA). This EUA will remain  in effect (meaning this test can be used) for the duration of the  Covid-19 declaration under Section 564(b)(1) of the Act, 21  U.S.C. section 360bbb-3(b)(1), unless the authorization is  terminated or revoked. Performed at [facility], [place] [address] [place], [place] [address]   MRSA PCR Screening     Status: None    Collection Time: 08/03/20  6:09 AM   Specimen: Nasal Mucosa; Nasopharyngeal  Result Value Ref Range Status   MRSA by PCR NEGATIVE NEGATIVE Final    Comment:        The GeneXpert MRSA Assay (FDA approved for NASAL specimens only), is one component of a comprehensive MRSA colonization surveillance program. It is not intended to diagnose MRSA infection nor to guide or monitor treatment for MRSA infections. Performed at [facility], [place] [address], [place], [place] [address]   Surgical pcr screen     Status: None   Collection Time: 08/04/20  7:44 AM   Specimen:  Nasal Mucosa; Nasal Swab  Result Value Ref Range Status   MRSA, PCR NEGATIVE NEGATIVE Final   Staphylococcus aureus NEGATIVE NEGATIVE Final    Comment: (NOTE) The Xpert SA Assay (FDA approved for NASAL specimens in patients 35 years of age and older), is one component of a comprehensive surveillance program. It is not intended to diagnose infection nor to guide or monitor treatment. Performed at [facility], [place] [address], [place], [place] [address]   Aerobic/Anaerobic Culture (surgical/deep wound)     Status: None   Collection Time: 08/04/20  2:03 PM   Specimen: PATH Other; Tissue  Result Value Ref Range Status   Specimen Description TISSUE  Final   Special Requests RIGHT PARIETAL OCCIPITAL MASS SPEC A  Final   Gram Stain   Final    FEW WBC PRESENT, PREDOMINANTLY MONONUCLEAR NO ORGANISMS SEEN    Culture   Final    No growth aerobically or anaerobically. Performed at [facility], [place] [address], [place], [place] [address]    Report Status 08/09/2020 FINAL  Final         Radiology Studies: No results found.      Scheduled Meds: . allopurinol  300 mg Oral Daily  . atorvastatin  20 mg Oral Daily  . Chlorhexidine Gluconate Cloth  6 each Topical Daily  . dexamethasone  2 mg Oral Q12H   Followed by  . [START ON 08/13/2020] dexamethasone  1 mg Oral Q12H   Followed by  . [START ON  08/15/2020] dexamethasone  1 mg Oral Daily  . docusate sodium  100 mg Oral BID  . enoxaparin (LOVENOX) injection  40 mg Subcutaneous Q24H  . folic acid  1 mg Oral Daily  . insulin aspart  0-15 Units Subcutaneous TID WC  . levETIRAcetam  750 mg Oral BID  . lisinopril  20 mg Oral Daily  . metoprolol tartrate  75 mg Oral BID  . nicotine  7 mg Transdermal Daily  . pantoprazole  40 mg Oral BID AC  . tamsulosin  0.4 mg Oral Daily  . thiamine  100 mg Oral Daily  . vitamin B-12  500 mcg Oral Daily   Continuous Infusions:   LOS: 8 days   Georgette Shell, MD  08/11/2020, 4:27 PM

## 2020-08-12 DIAGNOSIS — I10 Essential (primary) hypertension: Secondary | ICD-10-CM | POA: Diagnosis not present

## 2020-08-12 LAB — GLUCOSE, CAPILLARY
Glucose-Capillary: 83 mg/dL (ref 70–99)
Glucose-Capillary: 133 mg/dL — ABNORMAL HIGH (ref 70–99)
Glucose-Capillary: 167 mg/dL — ABNORMAL HIGH (ref 70–99)
Glucose-Capillary: 112 mg/dL — ABNORMAL HIGH (ref 70–99)

## 2020-08-12 MED ORDER — ACETAMINOPHEN 650 MG RE SUPP: 650.0000 mg | Freq: Four times a day (QID) | RECTAL | Status: DC | PRN

## 2020-08-12 MED ORDER — MAGNESIUM HYDROXIDE 400 MG/5ML PO SUSP
30.0000 mL | Freq: Every evening | ORAL | Status: DC | PRN
  Administered 2020-08-12: 30 mL via ORAL
  Filled 2020-08-12: qty 30

## 2020-08-12 MED ORDER — BISACODYL 5 MG PO TBEC
5.0000 mg | DELAYED_RELEASE_TABLET | Freq: Every day | ORAL | Status: DC | PRN
  Administered 2020-08-12 – 2020-08-14 (×3): 5 mg via ORAL
  Filled 2020-08-12 (×3): qty 1

## 2020-08-12 MED ORDER — TRAMADOL HCL 50 MG PO TABS: 50.0000 mg | ORAL_TABLET | Freq: Two times a day (BID) | ORAL | Status: DC | PRN

## 2020-08-12 MED ORDER — ACETAMINOPHEN 325 MG PO TABS
650.0000 mg | ORAL_TABLET | Freq: Four times a day (QID) | ORAL | Status: DC | PRN
  Administered 2020-08-15 – 2020-08-16 (×2): 650 mg via ORAL
  Filled 2020-08-12 (×2): qty 2

## 2020-08-12 NOTE — Progress Notes (Signed)
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 He had 3 seizures after which EMS was called and he was brought to the hospital. Upon eval in the ED he was found to have a right occipital lobe lesion measuring 6 cm x 4 cm with surrounding edema and evidence of midline shift to the left.  Neurosurgery recommended initiating Decadron.  Neurology recommended a IV Keppra bolus The patient was admitted to the triad hospitalist service. After further history was obtained it was determined that the patient was having weakness and frequent falls over the past couple of weeks.  Also noted was peripheral vision loss.   Assessment & Plan:   Principal Problem:   Seizure with provoking factor ([place]) Active Problems:   Celiac disease   Essential hypertension   Chronic diastolic CHF (congestive heart failure) (HCC)   Seizures (HCC)   History of alcohol abuse   Neoplasm of brain causing mass effect on adjacent structures (HCC)   Seizure disorder ([place])   Mixed hyperlipidemia due to type 2 diabetes mellitus ([place])   Type 2 diabetes mellitus without complication, with long-term current use of insulin ([place])   Bilateral carotid artery stenosis  Seizure disorder- provoked seizures likely related to occipital mass -Due to incompatible pacemaker, MRIs cannot be obtained - 9/29- Right Parietal-occipital craniotomy and tumor resection-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 - on Decadron taper per NS - radiation oncology is aware of him and is arranging follow up Dr. Isidore Moos - per NS, ok to discharge- CIR is looking into taking him Still waiting on insurance authorization for discharge.  Active Problems:  Acute encephalopathy (dementia at baseline?) - severe confusion occurring after surgery- he fell out of bed that night - ? If he is in alcohol withdrawal- history suggesting alcohol abuse  - ? If delirium related to steroids, anaesthesia or ICU stay - ? If due to narcotics - briefly on Precedex on 9/20 -plan: -  tapering steroids  - cont thiamine  Was given seroquel made him very lethargic  Chronic diastolic CHF/pacemaker  (congestive heart failure) (HCC)-stable   Gout - cont allopurinol  DM2 - A1c is 6.9 - hold Metformin- sugars < 200  H/o left parotid cancer - s/p left parotid radiation and parotid surgery  H/o PUD - cont PPI  BPH - Flomax   Estimated body mass index is 22.88 kg/m as calculated from the following:   Height as of this encounter: 5' 9.02" (1.753 m).   Weight as of this encounter: 70.3 kg.  DVT prophylaxis: lovenox Code Status: dnr Family Communication: none at bed side Disposition Plan:  Status is: Inpatient  Dispo: The patient is from: ALF              Anticipated d/c is to: SNF              Anticipated d/c date is: unknown              Patient currently is medically stable to d/c.   Consultants: ns  Procedures: Right parietal occipital craniotomy  and tumor resection on 08/04/2020 Antimicrobials:  Anti-infectives (From admission, onward)   Start     Dose/Rate Route Frequency Ordered Stop   08/04/20 1930  ceFAZolin (ANCEF) IVPB 1 g/50 mL premix        1 g 100 mL/hr over 30 Minutes Intravenous Every 8 hours 08/04/20 1921 08/05/20 1333   08/04/20 0800  ceFAZolin (ANCEF) IVPB 2g/100 mL premix        2 g 200 mL/hr over 30 Minutes Intravenous To ShortStay  Surgical 08/04/20 0713 08/04/20 1237     Subjective: Patient resting in bed in no acute distress no events overnight.  Waiting for rehab placement.  Objective: Vitals:   08/12/20 0425 08/12/20 0740 08/12/20 0910 08/12/20 1121  BP:  121/72  (!) 100/56  Pulse:  60 70 81  Resp:    16  Temp:  98.2 F (36.8 C)  98.3 F (36.8 C)  TempSrc:  Oral  Axillary  SpO2:  96%  97%  Weight: 70.3 kg     Height:        Intake/Output Summary (Last 24 hours) at 08/12/2020 1452 Last data filed at 08/12/2020 1357 Gross per 24 hour  Intake 463 ml  Output --  Net 463 ml   Filed Weights   08/10/20 0459 08/11/20 0500 08/12/20 0425  Weight: 74.5 kg 73.8 kg 70.3 kg    Examination:  General exam: Appears calm and comfortable  Respiratory system: Clear to auscultation. Respiratory effort normal. Cardiovascular system: S1 & S2 heard, RRR. No JVD, murmurs, rubs, gallops or clicks. No pedal edema. Gastrointestinal system: Abdomen is nondistended, soft and nontender. No organomegaly or masses felt. Normal bowel sounds heard. Central nervous system: Alert and oriented. No focal neurological deficits. Extremities: Symmetric 5 x 5 power. Skin: No rashes, lesions or ulcers Psychiatry: Unable to asses  Data Reviewed: I have personally reviewed following labs and imaging studies  CBC: Recent Labs  Lab 08/06/20 0551 08/07/20 0415 08/09/20 0626  WBC 13.6* 15.4* 9.1  NEUTROABS 12.4*  --   --   HGB 10.5* 10.4* 10.7*  HCT 30.2* 29.7* 31.2*  MCV 88.3 88.1 87.6  PLT 194 172 440   Basic Metabolic Panel: Recent Labs  Lab 08/06/20 0551 08/07/20 0415 08/09/20 0626  NA 137 136 136  K 3.9 3.5 3.9  CL 104 102 103  CO2 24 25 23   GLUCOSE 144* 149* 142*  BUN 20 24* 23  CREATININE 1.04 1.06 0.82  CALCIUM 9.3 9.1 9.2   GFR: Estimated Creatinine Clearance: 78.6 mL/min (by C-G formula based on SCr of 0.82 mg/dL). Liver Function Tests: Recent Labs  Lab 08/06/20 0551  AST 24  ALT 15  ALKPHOS 47   BILITOT 0.3  PROT 6.1*  ALBUMIN 3.2*   No results for input(s): LIPASE, AMYLASE in the last 168 hours. No results for input(s): AMMONIA in the last 168 hours. Coagulation Profile: No results for input(s): INR, PROTIME in the last 168 hours. Cardiac Enzymes: No results for input(s): CKTOTAL, CKMB, CKMBINDEX, TROPONINI in the last 168 hours. BNP (last 3 results) No results for input(s): PROBNP in the last 8760 hours. HbA1C: No results for input(s): HGBA1C in the last 72 hours. CBG: Recent Labs  Lab 08/11/20 1124 08/11/20 1613 08/11/20 2102 08/12/20 0736 08/12/20 1119  GLUCAP 110* 158* 162* 83 133*   Lipid Profile: No results for input(s): CHOL, HDL, LDLCALC, TRIG, CHOLHDL, LDLDIRECT in the last 72 hours. Thyroid Function Tests: No results for input(s): TSH, T4TOTAL, FREET4, T3FREE, THYROIDAB  in the last 72 hours. Anemia Panel: No results for input(s): VITAMINB12, FOLATE, FERRITIN, TIBC, IRON, RETICCTPCT in the last 72 hours. Sepsis Labs: No results for input(s): PROCALCITON, LATICACIDVEN in the last 168 hours.  Recent Results (from the past 240 hour(s))  Respiratory Panel by RT PCR (Flu A&B, Covid) - Urine, Clean Catch     Status: None   Collection Time: 08/02/20 10:08 PM   Specimen: Urine, Clean Catch; Nasopharyngeal  Result Value Ref Range Status   SARS Coronavirus 2 by RT PCR NEGATIVE NEGATIVE Final    Comment: (NOTE) SARS-CoV-2 target nucleic acids are NOT DETECTED.  The SARS-CoV-2 RNA is generally detectable in upper respiratoy specimens during the acute phase of infection. The lowest concentration of SARS-CoV-2 viral copies this assay can detect is 131 copies/mL. A negative result does not preclude SARS-Cov-2 infection and should not be used as the sole basis for treatment or other patient management decisions. A negative result may occur with  improper specimen collection/handling, submission of specimen other than nasopharyngeal swab, presence of viral  mutation(s) within the areas targeted by this assay, and inadequate number of viral copies (<131 copies/mL). A negative result must be combined with clinical observations, patient history, and epidemiological information. The expected result is Negative.  Fact Sheet for Patients:  PinkCheek.be  Fact Sheet for Healthcare Providers:  GravelBags.it  This test is no t yet approved or cleared by the [place] FDA and  has been authorized for detection and/or diagnosis of SARS-CoV-2 by FDA under an Emergency Use Authorization (EUA). This EUA will remain  in effect (meaning this test can be used) for the duration of the COVID-19 declaration under Section 564(b)(1) of the Act, 21 U.S.C. section 360bbb-3(b)(1), unless the authorization is terminated or revoked sooner.     Influenza A by PCR NEGATIVE NEGATIVE Final   Influenza B by PCR NEGATIVE NEGATIVE Final    Comment: (NOTE) The Xpert Xpress SARS-CoV-2/FLU/RSV assay is intended as an aid in  the diagnosis of influenza from Nasopharyngeal swab specimens and  should not be used as a sole basis for treatment. Nasal washings and  aspirates are unacceptable for Xpert Xpress SARS-CoV-2/FLU/RSV  testing.  Fact Sheet for Patients: PinkCheek.be  Fact Sheet for Healthcare Providers: GravelBags.it  This test is not yet approved or cleared by the [place] FDA and  has been authorized for detection and/or diagnosis of SARS-CoV-2 by  FDA under an Emergency Use Authorization (EUA). This EUA will remain  in effect (meaning this test can be used) for the duration of the  Covid-19 declaration under Section 564(b)(1) of the Act, 21  U.S.C. section 360bbb-3(b)(1), unless the authorization is  terminated or revoked. Performed at [facility], [place] [address] [place], [place] [address]   MRSA PCR Screening      Status: None   Collection Time: 08/03/20  6:09 AM   Specimen: Nasal Mucosa; Nasopharyngeal  Result Value Ref Range Status   MRSA by PCR NEGATIVE NEGATIVE Final    Comment:        The GeneXpert MRSA Assay (FDA approved for NASAL specimens only), is one component of a comprehensive MRSA colonization surveillance program. It is not intended to diagnose MRSA infection nor to guide or monitor treatment for MRSA infections. Performed at [facility], [place] [address] , [place] [address]   Surgical pcr screen     Status: None   Collection Time: 08/04/20  7:44 AM   Specimen: Nasal Mucosa; Nasal Swab  Result Value Ref Range Status   MRSA, PCR NEGATIVE NEGATIVE Final   Staphylococcus aureus NEGATIVE NEGATIVE Final    Comment: (NOTE) The Xpert SA Assay (FDA approved for NASAL specimens in patients 50 years of age and older), is one component of a comprehensive surveillance program. It is not intended to diagnose infection nor to guide or monitor treatment. Performed at [facility], [place] [address] [place], [place] [address]   Aerobic/Anaerobic Culture (surgical/deep wound)     Status: None   Collection Time: 08/04/20  2:03 PM   Specimen: PATH Other; Tissue  Result Value Ref Range Status   Specimen Description TISSUE  Final   Special Requests RIGHT PARIETAL OCCIPITAL MASS SPEC A  Final   Gram Stain   Final    FEW WBC PRESENT, PREDOMINANTLY MONONUCLEAR NO ORGANISMS SEEN    Culture   Final    No growth aerobically or anaerobically. Performed at [facility], [place] [address] [place], [place] [address]    Report Status 08/09/2020 FINAL  Final         Radiology Studies: No results found.      Scheduled Meds: . allopurinol  300 mg Oral Daily  . atorvastatin  20 mg Oral Daily  . Chlorhexidine Gluconate Cloth  6 each Topical Daily  . dexamethasone  2 mg Oral Q12H   Followed by  . [START ON 08/13/2020] dexamethasone  1 mg Oral Q12H   Followed by   . [START ON 08/15/2020] dexamethasone  1 mg Oral Daily  . docusate sodium  100 mg Oral BID  . enoxaparin (LOVENOX) injection  40 mg Subcutaneous Q24H  . folic acid  1 mg Oral Daily  . insulin aspart  0-15 Units Subcutaneous TID WC  . levETIRAcetam  750 mg Oral BID  . lisinopril  20 mg Oral Daily  . metoprolol tartrate  75 mg Oral BID  . nicotine  7 mg Transdermal Daily  . pantoprazole  40 mg Oral BID AC  . tamsulosin  0.4 mg Oral Daily  . thiamine  100 mg Oral Daily  . vitamin B-12  500 mcg Oral Daily   Continuous Infusions:   LOS: 9 days   Georgette Shell, MD  08/12/2020, 2:52 PM

## 2020-08-12 NOTE — TOC Progression Note (Signed)
Transition of Care [facility]) - Progression Note    Patient Details  Name: Francisco [name] MRN: [id number] Date of Birth: 19-Jan-1946  Transition of Care [facility]) CM/SW [place], [place] Phone Number: 08/12/2020, 1:24 PM  Clinical Narrative:     Insurance authorization remains pending.  Thurmond Butts, MSW, [place] Clinical Social Worker   Expected Discharge Plan: Skilled Nursing Facility Barriers to Discharge: Continued Medical Work up, SNF Pending bed offer, Insurance Authorization  Expected Discharge Plan and Services Expected Discharge Plan: [place] In-house Referral: Clinical Social Work     Living arrangements for the past 2 months: [place]                                       Social Determinants of Health (SDOH) Interventions    Readmission Risk Interventions No flowsheet data found.

## 2020-08-12 NOTE — Progress Notes (Signed)
Neurosurgery  Patient seen and examined. No complaints. Left visual field cut. Awake, alert and oriented to person, month, please. Stapled incision clean, dry, and intact.  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

## 2020-08-13 DIAGNOSIS — I10 Essential (primary) hypertension: Secondary | ICD-10-CM | POA: Diagnosis not present

## 2020-08-13 LAB — CBC
HCT: 32.2 % — ABNORMAL LOW (ref 39.0–52.0)
Hemoglobin: 10.6 g/dL — ABNORMAL LOW (ref 13.0–17.0)
MCH: 29.4 pg (ref 26.0–34.0)
MCHC: 32.9 g/dL (ref 30.0–36.0)
MCV: 89.4 fL (ref 80.0–100.0)
Platelets: 261 10*3/uL (ref 150–400)
RBC: 3.6 MIL/uL — ABNORMAL LOW (ref 4.22–5.81)
RDW: 15.3 % (ref 11.5–15.5)
WBC: 14.8 10*3/uL — ABNORMAL HIGH (ref 4.0–10.5)
nRBC: 0 % (ref 0.0–0.2)

## 2020-08-13 LAB — SARS CORONAVIRUS 2 BY RT PCR (HOSPITAL ORDER, PERFORMED IN ~~LOC~~ HOSPITAL LAB): SARS Coronavirus 2: NEGATIVE

## 2020-08-13 LAB — COMPREHENSIVE METABOLIC PANEL
ALT: 55 U/L — ABNORMAL HIGH (ref 0–44)
AST: 27 U/L (ref 15–41)
Albumin: 2.7 g/dL — ABNORMAL LOW (ref 3.5–5.0)
Alkaline Phosphatase: 49 U/L (ref 38–126)
Anion gap: 10 (ref 5–15)
BUN: 24 mg/dL — ABNORMAL HIGH (ref 8–23)
CO2: 22 mmol/L (ref 22–32)
Calcium: 8.2 mg/dL — ABNORMAL LOW (ref 8.9–10.3)
Chloride: 101 mmol/L (ref 98–111)
Creatinine, Ser: 1.02 mg/dL (ref 0.61–1.24)
GFR, Estimated: >60 mL/min (ref >60)
Glucose, Bld: 156 mg/dL — ABNORMAL HIGH (ref 70–99)
Potassium: 3.9 mmol/L (ref 3.5–5.1)
Sodium: 133 mmol/L — ABNORMAL LOW (ref 135–145)
Total Bilirubin: 0.5 mg/dL (ref 0.3–1.2)
Total Protein: 5.1 g/dL — ABNORMAL LOW (ref 6.5–8.1)

## 2020-08-13 LAB — GLUCOSE, CAPILLARY
Glucose-Capillary: 106 mg/dL — ABNORMAL HIGH (ref 70–99)
Glucose-Capillary: 106 mg/dL — ABNORMAL HIGH (ref 70–99)
Glucose-Capillary: 157 mg/dL — ABNORMAL HIGH (ref 70–99)
Glucose-Capillary: 106 mg/dL — ABNORMAL HIGH (ref 70–99)

## 2020-08-13 MED ORDER — SODIUM CHLORIDE 0.9 % IV SOLN: Freq: Once | INTRAVENOUS | Status: AC

## 2020-08-13 MED ORDER — SODIUM CHLORIDE 0.9 % IV SOLN: INTRAVENOUS | Status: DC

## 2020-08-13 NOTE — Progress Notes (Signed)
  Speech Language Pathology Treatment: Cognitive-Linquistic  Patient Details Name: Francisco [name] MRN: [id number] DOB: 1946-07-09 Today's Date: 08/13/2020 Time: 3943-2003 SLP Time Calculation (min) (ACUTE ONLY): 25 min  Assessment / Plan / Recommendation Clinical Impression  Pt was seen for cognitive therapy. Mod-Max cues were provided for pt to attend to the L side of his environment to find his call bell, and an additional Mod cues for problem solving to use it as a remote. Pt also participated in verbal sequencing and divergent naming tasks with Min cues for sustained attention once TV was turned off. Pt was able to show some carryover as he recalled how to use his remote to turn the TV back on at the end of the session. He will benefit from ongoing cognitive therapy.    HPI HPI: 74 year old male presented to the ED 9/27 from ALF with a seizure. 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  Dx large 6 cm mass in right parietal lobe and occipital lobe that was also involving the corpus callosum.  There was significant mass-effect. Underwent craniotomy and tumor excision 9/29.      SLP Plan  Continue with current plan of care       Recommendations                   Follow up Recommendations: Skilled Nursing facility SLP Visit Diagnosis: Cognitive communication deficit (L94.446) Plan: Continue with current plan of care       GO                Osie Bond., M.A. [place] Acute Rehabilitation Services Pager [phone] Office [phone]  08/13/2020, 3:16 PM

## 2020-08-13 NOTE — TOC Progression Note (Signed)
Transition of Care [facility]) - Progression Note    Patient Details  Name: KIRIL HIPPE MRN: [id number] Date of Birth: Apr 04, 1946  Transition of Care [facility]) CM/SW [place], [place] Phone Number: 08/13/2020, 3:36 PM  Clinical Narrative:     CSW advised [place] Jackelyn Poling), per attending MD patient will not discharge today, to anticipate discharge tomorrow.   Thurmond Butts, MSW, [place] Clinical Social Worker   Expected Discharge Plan: Skilled Nursing Facility Barriers to Discharge: Continued Medical Work up, SNF Pending bed offer, Insurance Authorization  Expected Discharge Plan and Services Expected Discharge Plan: [place] In-house Referral: Clinical Social Work     Living arrangements for the past 2 months: [place]                                       Social Determinants of Health (SDOH) Interventions    Readmission Risk Interventions No flowsheet data found.

## 2020-08-13 NOTE — TOC Progression Note (Signed)
Transition of Care [facility]) - Progression Note    Patient Details  Name: Francisco [name] MRN: [id number] Date of Birth: [birth year]  Transition of Care [facility]) CM/SW [place], [place] Phone Number: 08/13/2020, 12:26 PM  Clinical Narrative:     Received insurance authorization reference # [id number] from 10/08 thru 10/12.  Thurmond Butts, MSW, [place] Clinical Social Worker   Expected Discharge Plan: Skilled Nursing Facility Barriers to Discharge: Continued Medical Work up, SNF Pending bed offer, Insurance Authorization  Expected Discharge Plan and Services Expected Discharge Plan: [place] In-house Referral: Clinical Social Work     Living arrangements for the past 2 months: [place]                                       Social Determinants of Health (SDOH) Interventions    Readmission Risk Interventions No flowsheet data found.

## 2020-08-13 NOTE — Progress Notes (Signed)
Neurosurgery  patient seen and examined. S/p craniotomy for a metastatic Merkel cell cancer to the brain. Awaiting SNF placement. Will have follow up with Dr. Isidore Moos of radiation oncology and his medical oncologist. Will need to follow up in Neurosurgery clinic in one week for a staple removal. Continue with Keppra indefinitely and with dexamethasone taper.

## 2020-08-13 NOTE — Plan of Care (Signed)

## 2020-08-13 NOTE — Progress Notes (Signed)
Physical Therapy Treatment Patient Details Name: Francisco [name] MRN: [id number] DOB: January 07, 1946 Today's Date: 08/13/2020    History of Present Illness 74 year old male with past medical history of seizure disorder, alcohol abuse (in remission), hypertension, hyperlipidemia, celiac disease, diastolic congestive heart failure (EF 55-60% via Echo 2016), history of [place] parotid tumor (excised/radiated 08/2018), bilateral carotid stenosis, gout who presents to [facility] emergency department from his assisted living facility due to witnessed seizures on 08/02/2020. Pt also reports progressive LE weakness with multiple falls over the past weeks as well as vision changes. CT head reveals new R occipital lesion with surrounding edema and evidence of midline shift. Pt underwent R parietal occipital craniotomy and tumor excision on 08/04/2020.    PT Comments    Patient not progressing due to medical issues with orthostatic BP and unable to ambulate this session.  RN aware and contacting MD to treat.  Patient able to participate some in BADL's with OT.  Hopeful to tolerate more once BP treated.  Will continue skilled PT in the acute setting and continue to recommend follow up PT at SNF setting.    Follow Up Recommendations  SNF     Equipment Recommendations  Wheelchair (measurements PT);Wheelchair cushion (measurements PT)    Recommendations for Other Services       Precautions / Restrictions Precautions Precautions: Fall Precaution Comments: Impuslive; fall out of recliner during admission; large L neglect/field cut, check BP    Mobility  Bed Mobility Overal bed mobility: Needs Assistance Bed Mobility: Sit to Supine       Sit to supine: Mod assist;+2 for safety/equipment;+2 for physical assistance   General bed mobility comments: assist for positioning, trunk and legs into bed  Transfers Overall transfer level: Needs assistance Equipment used: Rolling walker (2  wheeled) Transfers: Sit to/from [employer] Transfers Sit to Stand: Max assist;+2 safety/equipment;+2 physical assistance Stand pivot transfers: Max assist;+2 physical assistance;+2 safety/equipment       General transfer comment: decreased initiation to come to standing, posterior lean, poor motor planning to pivot  Ambulation/Gait                 Stairs             Wheelchair Mobility    Modified Rankin (Stroke Patients Only)       Balance Overall balance assessment: Needs assistance Sitting-balance support: Feet supported Sitting balance-Leahy Scale: Poor Sitting balance - Comments: leaning to L in chair and unable to correct despite position with pillow for support on L Postural control: Posterior lean;Left lateral lean Standing balance support: Bilateral upper extremity supported Standing balance-Leahy Scale: Poor Standing balance comment: mod A with RW for balance with posterior lean                            Cognition Arousal/Alertness: Lethargic Behavior During Therapy: Flat affect Overall Cognitive Status: Impaired/Different from baseline Area of Impairment: Attention;Memory;Following commands;Safety/judgement;Problem solving;Orientation                 Orientation Level: Disoriented to;Time Current Attention Level: Sustained Memory: Decreased short-term memory;Decreased recall of precautions Following Commands: Follows one step commands with increased time;Follows one step commands inconsistently Safety/Judgement: Decreased awareness of deficits Awareness: Emergent Problem Solving: Slow processing;Requires verbal cues;Requires tactile cues;Difficulty sequencing;Decreased initiation General Comments: Increased lethargy (suspect to low BP numbers) increased time requried to follow tasks and respond      Exercises Other Exercises Other Exercises: seen with  OT and worked on combing hair after washing with shampoo cap, then  self feeding ice cream    General Comments General comments (skin integrity, edema, etc.): BP 76/45 up in chair, RN aware, assisted back to bed and BP 91 systolic.      Pertinent Vitals/Pain Pain Assessment: No/denies pain    Home Living                      Prior Function            PT Goals (current goals can now be found in the care plan section) Acute Rehab PT Goals Patient Stated Goal: to get some ice cream Progress towards PT goals: Not progressing toward goals - comment    Frequency    Min 3X/week      PT Plan Current plan remains appropriate    Co-evaluation PT/OT/SLP Co-Evaluation/Treatment: Yes Reason for Co-Treatment: For patient/therapist safety;To address functional/ADL transfers PT goals addressed during session: Mobility/safety with mobility;Balance OT goals addressed during session: ADL's and self-care;Proper use of Adaptive equipment and DME;Strengthening/ROM      AM-PAC PT "6 Clicks" Mobility   Outcome Measure  Help needed turning from your back to your side while in a flat bed without using bedrails?: A Lot Help needed moving from lying on your back to sitting on the side of a flat bed without using bedrails?: A Lot Help needed moving to and from a bed to a chair (including a wheelchair)?: Total Help needed standing up from a chair using your arms (e.g., wheelchair or bedside chair)?: Total Help needed to walk in hospital room?: Total Help needed climbing 3-5 steps with a railing? : Total 6 Click Score: 8    End of Session Equipment Utilized During Treatment: Gait belt Activity Tolerance: Treatment limited secondary to medical complications (Comment) (orthostatic/lethargic) Patient left: in bed;with call bell/phone within reach;with bed alarm set   PT Visit Diagnosis: Unsteadiness on feet (R26.81);Other abnormalities of gait and mobility (R26.89);Muscle weakness (generalized) (M62.81);Repeated falls (R29.6);Other symptoms and signs  involving the nervous system (R29.898)     Time: 6015-6153 PT Time Calculation (min) (ACUTE ONLY): 25 min  Charges:  $Therapeutic Activity: 8-22 mins                     Magda Kiel, PT Acute Rehabilitation Services PHKFE:[phone] Office:[phone] 08/13/2020    Reginia Naas 08/13/2020, 3:52 PM

## 2020-08-13 NOTE — Progress Notes (Signed)
PROGRESS NOTE    Francisco [name]  QJJ:[id number] DOB: 04-29-1946 DOA: 08/02/2020 PCP: Lesia Hausen, PA   Brief Narrative: 74 year old male was at [facility] assisted living with past medical history of seizure disorder, alcohol abuse(in remission), hypertension, hyperlipidemia, celiac disease, diastolic congestive heart failure(EF 55-60% via Echo 2016),history of [place] parotid tumor(excised/radiated 08/2018),bilateral carotid stenosis,gout who presents to [facility] emergency department from his assisted living facility due to witnessed seizures. He had 3 seizures after which EMS was called and he was brought to the hospital. Upon eval in the ED he was found to have a right occipital lobe lesion measuring 6 cm x 4 cm with surrounding edema and evidence of midline shift to the left.  Neurosurgery recommended initiating Decadron.  Neurology recommended a IV Keppra bolus The patient was admitted to the triad hospitalist service. After further history was obtained it was determined that the patient was having weakness and frequent falls over the past couple of weeks.  Also noted was peripheral vision loss.   Assessment & Plan:   Principal Problem:   Seizure with provoking factor ([place]) Active Problems:   Celiac disease   Essential hypertension   Chronic diastolic CHF (congestive heart failure) (HCC)   Seizures (HCC)   History of alcohol abuse   Neoplasm of brain causing mass effect on adjacent structures (HCC)   Seizure disorder ([place])   Mixed hyperlipidemia due to type 2 diabetes mellitus ([place])   Type 2 diabetes mellitus without complication, with long-term current use of insulin ([place])   Bilateral carotid artery stenosis  Seizure disorder- provoked seizures likely related to occipital mass -Due to incompatible pacemaker, MRIs cannot be obtained - 9/29- Right Parietal-occipital craniotomy and tumor resection-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 - on Decadron taper per NS - radiation oncology is aware of him and is arranging follow up Dr. Isidore Moos - per NS, ok to discharge- snf is looking into taking him  Active Problems:  Acute encephalopathy (dementia at baseline?) - severe confusion occurring after surgery- he fell out of bed that night - ? If he is in alcohol withdrawal- history suggesting alcohol abuse  - ? If delirium related to steroids, anaesthesia or ICU stay - ? If due to narcotics - briefly on Precedex on 9/20 -plan: -  tapering steroids  - cont thiamine  Was given seroquel made him very lethargic  Chronic diastolic CHF/pacemaker  (congestive heart failure) (HCC)-stable   Gout - cont allopurinol  DM2 - A1c is 6.9 - hold Metformin- sugars < 200  H/o left parotid cancer - s/p left parotid radiation and parotid surgery  H/o PUD - cont PPI  BPH - Flomax  Hypotension patient was on 2 antihypertensives beta-blocker and ACE inhibitor which was not given today due to soft blood pressure.  We will check a stat CBC CMP and start normal saline bolus.  Will not discharge him today due to due to this new change.  If he remains stable overnight to discharge in a.m. to SNF.  Estimated body mass index is 24.28 kg/m as calculated from the following:   Height as of this encounter: 5' 9.02" (1.753 m).   Weight as of this encounter: 74.6 kg.  DVT prophylaxis: lovenox Code Status: dnr Family Communication: none at bed side Disposition Plan:  Status is: Inpatient  Dispo: The patient is from: ALF              Anticipated d/c  is to: SNF              Anticipated d/c date is: unknown              Patient currently is medically stable to d/c.   Consultants: ns  Procedures: Right parietal occipital craniotomy and tumor resection on 08/04/2020 Antimicrobials:  Anti-infectives (From admission, onward)   Start     Dose/Rate Route Frequency Ordered Stop   08/04/20  1930  ceFAZolin (ANCEF) IVPB 1 g/50 mL premix        1 g 100 mL/hr over 30 Minutes Intravenous Every 8 hours 08/04/20 1921 08/05/20 1333   08/04/20 0800  ceFAZolin (ANCEF) IVPB 2g/100 mL premix        2 g 200 mL/hr over 30 Minutes Intravenous To ShortStay Surgical 08/04/20 0713 08/04/20 1237     Subjective: Patient resting in bed when I saw him earlier today Later staff reported he has low blood pressure he ate 60% of his meals and is awake and is eating ice cream  Objective: Vitals:   08/13/20 0912 08/13/20 1120 08/13/20 1142 08/13/20 1257  BP: (!) 105/40 (!) 77/53 (!) 98/55 (!) 82/45  Pulse:  68  69  Resp:  13    Temp:  98.3 F (36.8 C)  98.1 F (36.7 C)  TempSrc:  Oral  Oral  SpO2:  96%  98%  Weight:      Height:        Intake/Output Summary (Last 24 hours) at 08/13/2020 1335 Last data filed at 08/13/2020 1000 Gross per 24 hour  Intake 223 ml  Output 850 ml  Net -627 ml   Filed Weights   08/11/20 0500 08/12/20 0425 08/13/20 0500  Weight: 73.8 kg 70.3 kg 74.6 kg    Examination:  General exam: Appears calm and comfortable  Respiratory system: Clear to auscultation. Respiratory effort normal. Cardiovascular system: S1 & S2 heard, RRR. No JVD, murmurs, rubs, gallops or clicks. No pedal edema. Gastrointestinal system: Abdomen is nondistended, soft and nontender. No organomegaly or masses felt. Normal bowel sounds heard. Central nervous system: Alert and oriented. No focal neurological deficits. Extremities: Symmetric 5 x 5 power. Skin: No rashes, lesions or ulcers Psychiatry: Unable to asses  Data Reviewed: I have personally reviewed following labs and imaging studies  CBC: Recent Labs  Lab 08/07/20 0415 08/09/20 0626  WBC 15.4* 9.1  HGB 10.4* 10.7*  HCT 29.7* 31.2*  MCV 88.1 87.6  PLT 172 852   Basic Metabolic Panel: Recent Labs  Lab 08/07/20 0415 08/09/20 0626  NA 136 136  K 3.5 3.9  CL 102 103  CO2 25 23  GLUCOSE 149* 142*  BUN 24* 23    CREATININE 1.06 0.82  CALCIUM 9.1 9.2   GFR: Estimated Creatinine Clearance: 79 mL/min (by C-G formula based on SCr of 0.82 mg/dL). Liver Function Tests: No results for input(s): AST, ALT, ALKPHOS, BILITOT, PROT, ALBUMIN in the last 168 hours. No results for input(s): LIPASE, AMYLASE in the last 168 hours. No results for input(s): AMMONIA in the last 168 hours. Coagulation Profile: No results for input(s): INR, PROTIME in the last 168 hours. Cardiac Enzymes: No results for input(s): CKTOTAL, CKMB, CKMBINDEX, TROPONINI in the last 168 hours. BNP (last 3 results) No results for input(s): PROBNP in the last 8760 hours. HbA1C: No results for input(s): HGBA1C in the last 72 hours. CBG: Recent Labs  Lab 08/12/20 1119 08/12/20 1614 08/12/20 2104 08/13/20 0750 08/13/20 1117  GLUCAP 133* 167*  112* 106* 106*   Lipid Profile: No results for input(s): CHOL, HDL, LDLCALC, TRIG, CHOLHDL, LDLDIRECT in the last 72 hours. Thyroid Function Tests: No results for input(s): TSH, T4TOTAL, FREET4, T3FREE, THYROIDAB in the last 72 hours. Anemia Panel: No results for input(s): VITAMINB12, FOLATE, FERRITIN, TIBC, IRON, RETICCTPCT in the last 72 hours. Sepsis Labs: No results for input(s): PROCALCITON, LATICACIDVEN in the last 168 hours.  Recent Results (from the past 240 hour(s))  Surgical pcr screen     Status: None   Collection Time: 08/04/20  7:44 AM   Specimen: Nasal Mucosa; Nasal Swab  Result Value Ref Range Status   MRSA, PCR NEGATIVE NEGATIVE Final   Staphylococcus aureus NEGATIVE NEGATIVE Final    Comment: (NOTE) The Xpert SA Assay (FDA approved for NASAL specimens in patients 39 years of age and older), is one component of a comprehensive surveillance program. It is not intended to diagnose infection nor to guide or monitor treatment. Performed at [facility], [place] [address] [place], [place] [address]   Aerobic/Anaerobic Culture (surgical/deep wound)     Status: None    Collection Time: 08/04/20  2:03 PM   Specimen: PATH Other; Tissue  Result Value Ref Range Status   Specimen Description TISSUE  Final   Special Requests RIGHT PARIETAL OCCIPITAL MASS SPEC A  Final   Gram Stain   Final    FEW WBC PRESENT, PREDOMINANTLY MONONUCLEAR NO ORGANISMS SEEN    Culture   Final    No growth aerobically or anaerobically. Performed at [facility], [place] [address] [place], [place] [address]    Report Status 08/09/2020 FINAL  Final         Radiology Studies: No results found.      Scheduled Meds: . allopurinol  300 mg Oral Daily  . atorvastatin  20 mg Oral Daily  . Chlorhexidine Gluconate Cloth  6 each Topical Daily  . dexamethasone  1 mg Oral Q12H   Followed by  . [START ON 08/15/2020] dexamethasone  1 mg Oral Daily  . docusate sodium  100 mg Oral BID  . enoxaparin (LOVENOX) injection  40 mg Subcutaneous Q24H  . folic acid  1 mg Oral Daily  . insulin aspart  0-15 Units Subcutaneous TID WC  . levETIRAcetam  750 mg Oral BID  . lisinopril  20 mg Oral Daily  . metoprolol tartrate  75 mg Oral BID  . nicotine  7 mg Transdermal Daily  . pantoprazole  40 mg Oral BID AC  . tamsulosin  0.4 mg Oral Daily  . thiamine  100 mg Oral Daily  . vitamin B-12  500 mcg Oral Daily   Continuous Infusions:   LOS: 10 days   Georgette Shell, MD  08/13/2020, 1:35 PM

## 2020-08-13 NOTE — Progress Notes (Signed)
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 Pt also reports progressive LE weakness with multiple falls over the past weeks as well as vision changes. CT head reveals new R occipital lesion with surrounding edema and evidence of midline shift. Pt underwent R parietal occipital craniotomy and tumor excision on 08/04/2020.   OT comments  Patient continues to make progress towards goals in skilled OT session. Patient's session limited due to low BP readings upon entry into the room. BP reassessed and found to be 76/45, pt transferred back to bed, placed in supine, with reading of 98/55. Pt requiring max cues in order to keep eyes open and respond to prompting, however improved with being in supine. RN notified during session due to levels. Pt left with all needs met, will continue to follow acutely.     Follow Up Recommendations  SNF;Supervision/Assistance - 24 hour    Equipment Recommendations  None recommended by OT    Recommendations for Other Services      Precautions / Restrictions Precautions Precautions: Fall Precaution Comments: Impuslive; fall out of recliner during admission; large L neglect/field cut, check BP       Mobility Bed Mobility Overal bed mobility: Needs Assistance Bed Mobility: Sit to Supine       Sit to supine: Mod assist;+2 for safety/equipment;+2 for physical assistance      Transfers Overall transfer level: Needs  assistance Equipment used: Rolling walker (2 wheeled) Transfers: Sit to/from [employer] Transfers Sit to Stand: Max assist;+2 safety/equipment;+2 physical assistance Stand pivot transfers: Max assist;+2 physical assistance;+2 safety/equipment       General transfer comment: decreased initiation to come to standing, posterior lean, poor motor planning to pivot    Balance Overall balance assessment: Needs assistance Sitting-balance support: Feet supported Sitting balance-Leahy Scale: Poor   Postural control: Posterior lean;Left lateral lean Standing balance support: Bilateral upper extremity supported Standing balance-Leahy Scale: Poor                             ADL either performed or assessed with clinical judgement   ADL Overall ADL's : Needs assistance/impaired Eating/Feeding: Set up;Sitting   Grooming: Wash/dry hands;Wash/dry face;Brushing hair;Minimal assistance;Sitting                               Functional mobility during ADLs: Maximal assistance;+2 for physical assistance;+2 for safety/equipment;Cueing for safety;Cueing for sequencing General ADL Comments: Increased assistance required due to low BP and lethargy     Vision       Perception     Praxis      Cognition Arousal/Alertness: Lethargic Behavior During Therapy: Flat affect Overall Cognitive Status: Impaired/Different from baseline Area of Impairment: Attention;Memory;Following commands;Safety/judgement;Problem solving;Orientation                 Orientation Level: Disoriented to;Time Current Attention Level: Sustained Memory: Decreased short-term memory;Decreased recall of precautions Following Commands: Follows one step commands with increased time;Follows one step commands inconsistently Safety/Judgement: Decreased awareness of deficits  Awareness: Emergent Problem Solving: Slow processing;Requires verbal cues;Requires tactile cues;Difficulty  sequencing;Decreased initiation General Comments: Increased lethargy (suspect to low BP numbers) increased time requried to follow tasks and respond        Exercises     Shoulder Instructions       General Comments      Pertinent Vitals/ Pain       Pain Assessment: No/denies pain  Home Living                                          Prior Functioning/Environment              Frequency  Min 2X/week        Progress Toward Goals  OT Goals(current goals can now be found in the care plan section)  Progress towards OT goals: Progressing toward goals  Acute Rehab OT Goals Patient Stated Goal: to get some ice cream OT Goal Formulation: Patient unable to participate in goal setting Time For Goal Achievement: 08/19/20 Potential to Achieve Goals: Good  Plan Discharge plan remains appropriate    Co-evaluation      Reason for Co-Treatment: For patient/therapist safety;To address functional/ADL transfers PT goals addressed during session: Mobility/safety with mobility;Balance;Strengthening/ROM OT goals addressed during session: ADL's and self-care;Proper use of Adaptive equipment and DME;Strengthening/ROM      AM-PAC OT "6 Clicks" Daily Activity     Outcome Measure   Help from another person eating meals?: A Little Help from another person taking care of personal grooming?: A Little Help from another person toileting, which includes using toliet, bedpan, or urinal?: Total Help from another person bathing (including washing, rinsing, drying)?: A Lot Help from another person to put on and taking off regular upper body clothing?: A Lot Help from another person to put on and taking off regular lower body clothing?: Total 6 Click Score: 12    End of Session Equipment Utilized During Treatment: Gait belt;Rolling walker  OT Visit Diagnosis: Unsteadiness on feet (R26.81);Other abnormalities of gait and mobility (R26.89);Muscle weakness (generalized)  (M62.81);Other symptoms and signs involving cognitive function;Other symptoms and signs involving the nervous system (R29.898);History of falling (Z91.81);Pain   Activity Tolerance Patient limited by lethargy   Patient Left in bed;with call bell/phone within reach;with bed alarm set;with nursing/sitter in room   Nurse Communication Mobility status;Other (comment) (Low BP)        Time: 1638-4536 OT Time Calculation (min): 25 min  Charges: OT General Charges $OT Visit: 1 Visit OT Treatments $Self Care/Home Management : 8-22 mins  Corinne Ports E. Jazyah Butsch, COTA/L Acute Rehabilitation Services [phone] [place] 08/13/2020, 3:09 PM

## 2020-08-14 ENCOUNTER — Inpatient Hospital Stay (HOSPITAL_COMMUNITY): Payer: Medicare HMO

## 2020-08-14 DIAGNOSIS — I10 Essential (primary) hypertension: Secondary | ICD-10-CM | POA: Diagnosis not present

## 2020-08-14 LAB — GLUCOSE, CAPILLARY
Glucose-Capillary: 80 mg/dL (ref 70–99)
Glucose-Capillary: 114 mg/dL — ABNORMAL HIGH (ref 70–99)
Glucose-Capillary: 135 mg/dL — ABNORMAL HIGH (ref 70–99)
Glucose-Capillary: 121 mg/dL — ABNORMAL HIGH (ref 70–99)

## 2020-08-14 MED ORDER — LEVETIRACETAM 750 MG PO TABS: 750.0000 mg | ORAL_TABLET | Freq: Two times a day (BID) | ORAL | 2 refills | Status: AC

## 2020-08-14 MED ORDER — MIDODRINE HCL 5 MG PO TABS
5.0000 mg | ORAL_TABLET | Freq: Three times a day (TID) | ORAL | Status: DC
  Administered 2020-08-14 – 2020-08-15 (×2): 5 mg via ORAL
  Filled 2020-08-14 (×2): qty 1

## 2020-08-14 MED ORDER — SODIUM CHLORIDE 0.9 % IV SOLN: INTRAVENOUS | Status: DC

## 2020-08-14 MED ORDER — ONDANSETRON HCL 4 MG PO TABS: 4.0000 mg | ORAL_TABLET | ORAL | 0 refills | Status: DC | PRN

## 2020-08-14 MED ORDER — DEXAMETHASONE 1 MG PO TABS: 1.0000 mg | ORAL_TABLET | Freq: Every day | ORAL | 0 refills | Status: DC

## 2020-08-14 NOTE — Discharge Summary (Addendum)
Physician Discharge Summary  Francisco [name] OMV:[id number] DOB: 19-Dec-1945 DOA: 08/02/2020  PCP: Lesia Hausen, PA  Admit date: 08/02/2020 Discharge date: 08/16/20 Admitted From: Nursing Home Disposition: Nursing home Recommendations for Outpatient Follow-up:  1. Follow up with PCP in 1-2 weeks 2. Please obtain BMP/CBC in one week 3. Follow-up with Dr. Isidore Moos 4. Please note that I have stopped all his blood pressure medications and had to start him on midodrine to keep his blood pressure normal.  Please check his blood pressure daily and titrate your midodrine up or down as needed.  Restart antihypertensives if needed. 5. Staples from the scalp was [facility] 08/16/2020 monitor the area closely for infection or dehiscence. Home Health: None  equipment/Devices: None  Discharge Condition: Stable  CODE STATUS DO NOT RESUSCITATE Diet recommendation: Cardiac diet Brief/Interim Summary:74 year old male was at [facility] assisted living with past medical history of seizure disorder, alcohol abuse(in remission), hypertension, hyperlipidemia, celiac disease, diastolic congestive heart failure(EF 55-60% via Echo 2016),history of [place] parotid tumor(excised/radiated 08/2018),bilateral carotid stenosis,gout who presents to [facility] emergency department from his assisted living facility due to witnessed seizures. He had 3 seizures after which EMS was called and he was brought to the hospital. Upon eval in the ED he was found to have a right occipital lobe lesion measuring 6 cm x 4 cm with surrounding edema and evidence of midline shift to the left. Neurosurgery recommended initiating Decadron. Neurology recommended a IV Keppra bolus The patient was admitted to the [facility]ad hospitalist service. After further history was obtained it was determined that the patient was having weakness and frequent falls over the past couple of weeks. Also noted was peripheral vision loss Discharge Diagnoses:   Principal Problem:   Seizure with provoking factor [facility]) Active Problems:   Celiac disease   Essential hypertension   Chronic diastolic CHF (congestive heart failure) (HCC)   Seizures (HCC)   History of alcohol abuse   Neoplasm of brain causing mass effect on adjacent structures (HCC)   Seizure disorder ([place])   Mixed hyperlipidemia due to type 2 diabetes mellitus ([place])   Type 2 diabetes mellitus without complication, with long-term current use of insulin ([place])   Bilateral carotid artery stenosis   Seizure disorder- provoked seizures likely related to occipital mass-he is status post right parietal occipital craniotomy and tumor resection on August 04, 2020.  Pathology showed metastatic Merkel cell cancer.  Patient was started on Decadron continued on Keppra.  Keppra dose was increased to 750 mg twice a day.  He must continue on Keppra indefinitely.  Decadron is being tapered and currently he is on 1 mg daily at the time of discharge for 4 days and then discontinue Decadron. He will have to follow-up with radiation oncology Dr. Isidore Moos  Chronic diastolic CHF/pacemaker  (congestive heart failure) (HCC)-stable.    Gout - cont allopurinol   DM2 - A1c is 6.9 Metformin is on hold at the time of discharge since his p.o. intake was poor.  Reassess this and restart if needed.  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  Patient has history of hypertension and is on multiple medications prior to admission.  I have stopped all his antihypertensives at the time of discharge.  Continue midodrine 5 mg 3 times a day adjust the dose of midodrine depending on his blood pressure.   Estimated body mass index is 24.41 kg/m as calculated  from the following:   Height as of this encounter: 5' 9.02" (1.753 m).   Weight as of this encounter: 75 kg.  Discharge Instructions  Discharge Instructions     Diet - low sodium heart healthy   Complete by: As directed    Increase activity slowly   Complete by: As directed    No wound care   Complete by: As directed      Allergies as of 08/14/2020   No Known Allergies     Medication List    STOP taking these medications   amLODipine 5 MG tablet Commonly known as: NORVASC   ferrous sulfate 325 (65 FE) MG tablet   folic acid 1 MG tablet Commonly known as: FOLVITE   furosemide 20 MG tablet Commonly known as: LASIX   levofloxacin 500 MG tablet Commonly known as: LEVAQUIN   lisinopril 20 MG tablet Commonly known as: ZESTRIL   metFORMIN 500 MG tablet Commonly known as: GLUCOPHAGE   metoprolol tartrate 50 MG tablet Commonly known as: LOPRESSOR   multivitamin with minerals Tabs tablet   Mylanta 200-200-20 MG/5ML suspension Generic drug: alum & mag hydroxide-simeth   neomycin-colistin-hydrocortisone-thonzonium 3.01-06-09-0.5 MG/ML OTIC suspension Commonly known as: Cortisporin-TC   nystatin 100000 UNIT/ML suspension Commonly known as: MYCOSTATIN   polyethylene glycol 17 g packet Commonly known as: MIRALAX / GLYCOLAX   potassium chloride 10 MEQ tablet Commonly known as: KLOR-CON   thiamine 100 MG tablet   traMADol 50 MG tablet Commonly known as: ULTRAM     TAKE these medications   allopurinol 300 MG tablet Commonly known as: ZYLOPRIM TAKE 1 TABLET(300 MG) BY MOUTH DAILY What changed: See the new instructions.   atorvastatin 20 MG tablet Commonly known as: LIPITOR Take 1 tablet (20 mg total) by mouth daily.   clotrimazole-betamethasone cream Commonly known as: LOTRISONE APPLY EXTERNALLY TO THE AFFECTED AREA TWICE DAILY   colchicine 0.6 MG tablet Take 1 tablet (0.6 mg total) by mouth 2 (two) times daily as needed (gout flare). Take twice daily for 2 weeks, then use as needed for gout flare up   dexamethasone 1 MG tablet Commonly known as: DECADRON Take 1 tablet (1 mg total) by mouth daily. Start taking  on: August 15, 2020   guaiFENesin 600 MG 12 hr tablet Commonly known as: MUCINEX Take 600-1,200 mg by mouth every 12 (twelve) hours as needed for cough.   guaiFENesin 100 MG/5ML liquid Commonly known as: ROBITUSSIN Take 200 mg by mouth every 6 (six) hours as needed for cough.   levETIRAcetam 750 MG tablet Commonly known as: KEPPRA Take 1 tablet (750 mg total) by mouth 2 (two) times daily. What changed:   how much to take  when to take this   lidocaine 2 % solution Commonly known as: XYLOCAINE Patient: Mix 1part 2% viscous lidocaine, 1part H20. Swish & swallow 31m of diluted mixture, 265m before meals and at bedtime, up to QID   loperamide 2 MG tablet Commonly known as: IMODIUM A-D Take 2 mg by mouth as needed for diarrhea or loose stools.   magnesium hydroxide 400 MG/5ML suspension Commonly known as: MILK OF MAGNESIA Take 30 mLs by mouth at bedtime as needed for mild constipation.   naproxen sodium 220 MG tablet Commonly known as: ALEVE Take 220 mg by mouth every 12 (twelve) hours as needed (joint pain).   neomycin-bacitracin-polymyxin ointment Commonly known as: NEOSPORIN Apply 1 application topically as needed for wound care.   ondansetron 4 MG tablet Commonly known as: ZOFRAN  Take 1 tablet (4 mg total) by mouth every 4 (four) hours as needed for nausea or vomiting.   pantoprazole 40 MG tablet Commonly known as: PROTONIX TAKE 1 TABLET BY MOUTH TWICE DAILY BEFORE A MEAL What changed: See the new instructions.   tamsulosin 0.4 MG Caps capsule Commonly known as: FLOMAX TAKE 1 CAPSULE(0.4 MG) BY MOUTH DAILY What changed: See the new instructions.   vitamin B-12 500 MCG tablet Commonly known as: CYANOCOBALAMIN Take 500 mcg by mouth daily.       No Known Allergies  Consultations:  Neurosurgery   Procedures/Studies: CT HEAD WO CONTRAST  Result Date: 08/05/2020 CLINICAL DATA:  Craniotomy for tumor resection 08/04/2020. Fall today. EXAM: CT HEAD  WITHOUT CONTRAST CT CERVICAL SPINE WITHOUT CONTRAST TECHNIQUE: Multidetector CT imaging of the head and cervical spine was performed following the standard protocol without intravenous contrast. Multiplanar CT image reconstructions of the cervical spine were also generated. COMPARISON:  CT head 08/04/2020.  CT cervical spine 08/02/2020 FINDINGS: CT HEAD FINDINGS Brain: Right parietal craniotomy for tumor resection. Large post resection cavity is present filled with fluid and gas as well as a small amount of blood. Postoperative blood in the cavity and surrounding tissues is unchanged from earlier today. Extra-axial fluid collection containing fluid and blood at the resection site has improved. Intraventricular hemorrhage unchanged. Intraventricular gas unchanged. Mild ventricular enlargement unchanged. Mild pneumocephalus unchanged. Chronic microvascular ischemic changes are present. No acute ischemic infarct. Vascular: Negative for hyperdense vessel Skull: Right parietal craniotomy. Sinuses/Orbits: Paranasal sinuses clear.  Negative orbit Other: None CT CERVICAL SPINE FINDINGS Alignment: Mild anterolisthesis C3-4 and C6-7 unchanged. Skull base and vertebrae: Negative for fracture or mass Soft tissues and spinal canal: No soft tissue mass or hematoma. Pacemaker leads on the left. Diffuse atherosclerotic disease. Extensive atherosclerotic calcification in the carotid artery bilaterally. Disc levels: Multilevel disc degeneration and spurring. Mild facet degeneration. Foraminal encroachment bilaterally due to spurring C4-5, C5-6, C6-7 Upper chest: Small subpleural blebs in the right apex. No acute abnormality. Other: None IMPRESSION: 1. 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 Large post resection cavity filled with fluid and small amount of blood unchanged from earlier today. Extra-axial fluid collection at the resection site is smaller. Intraventricular blood is stable. Pneumocephalus unchanged. No acute  injury. 2. No acute cervical spine injury. Electronically Signed   By: Franchot Gallo M.D.   On: 08/05/2020 13:19   CT HEAD WO CONTRAST  Result Date: 08/05/2020 CLINICAL DATA:  Brain/CNS neoplasm, assess treatment response. EXAM: CT HEAD WITHOUT CONTRAST TECHNIQUE: Contiguous axial images were obtained from the base of the skull through the vertex without intravenous contrast. COMPARISON:  Prior head CT examinations 08/04/2020 and earlier. FINDINGS: Brain: Postoperative changes from interval right parietooccipital craniotomy and mass resection. There is small-volume hemorrhage along the periphery of the resection cavity as well as along the right aspect of the adjacent falx and extending anteriorly within the interhemispheric fissure. Small to moderate volume intraventricular hemorrhage is also present within the lateral and third ventricles. A 1.6 cm focus of hemorrhage along the medial aspect of the posterior right lateral ventricle may be intraventricular or parenchymal (series 3, image 14). Bifrontal extra-axial pneumocephalus. Pneumocephalus is also present within the anterior right lateral ventricle. There is an extra-axial fluid collection deep to the right parietooccipital cranioplasty containing a small amount of acute hemorrhage measuring 0.9 cm in greatest thickness (series 3, image 21). There is apparent residual tumor along the inferior and deep margins of the resection cavity, as  well as along the left aspect of the falx (for instance as seen on series 3, images 15-19). No midline shift. Stable generalized cerebral atrophy and chronic small vessel ischemic disease. Redemonstrated chronic lacunar infarcts within the left basal ganglia and thalamus. Vascular: No hyperdense vessel.  Atherosclerotic calcifications. Skull: Right parietooccipital cranioplasty. Overlying scalp soft tissue swelling/edema and scalp staples. Sinuses/Orbits: Visualized orbits show no acute finding. Left mastoid effusion.  IMPRESSION: Postoperative changes from interval right parietooccipital craniotomy and mass resection. This includes small-volume hemorrhage along the periphery of the resection cavity and adjacent falx, also extending anteriorly within the interhemispheric fissure. There is also small-to-moderate volume intraventricular hemorrhage within the lateral and third ventricles. No evidence of hydrocephalus at this time. There has been significant interval tumor debulking. Apparent residual tumor along the inferior and deep aspects of the resection cavity and along the left aspect of the posterior falx. Extra-axial fluid collection deep to the right parietooccipital cranioplasty measuring 0.9 cm containing a small amount of acute hemorrhage. Pneumocephalus along the anterior frontal lobes and within the right lateral ventricle frontal horn. Electronically Signed   By: Kellie Simmering DO   On: 08/05/2020 08:54   CT HEAD WO CONTRAST  Result Date: 08/04/2020 CLINICAL DATA:  Brain mass or lesion; brain lab protocol.  Seizure. EXAM: CT HEAD WITHOUT CONTRAST TECHNIQUE: Contiguous axial images were obtained from the base of the skull through the vertex without intravenous contrast. COMPARISON:  Prior head CT 08/03/2020 and head CT 08/02/2020. FINDINGS: Brain: Unchanged size of a mass centered within the paramedian right parietooccipital lobes. The mass measures 6.1 x 4.7 x 5.1 cm (AP x TV x CC). As before, the mass abuts the right aspect of the falx posteriorly and subtly crosses midline. Also unchanged, the mass encroaches upon the posterior aspect of the right lateral ventricle. Unchanged surrounding edema within the posterior right cerebral white matter and callosal splenium. Unchanged local mass effect. Stable leftward midline shift at the level of the septum pellucidum measuring 3 mm. No evidence of acute intracranial hemorrhage. No extra-axial fluid collection. Stable background mild generalized cerebral atrophy and chronic  small vessel ischemic disease. Redemonstrated chronic lacunar infarcts within the left basal ganglia and thalamus. Vascular: No hyperdense vessel.  Atherosclerotic calcifications. Skull: Normal. Negative for fracture or focal lesion. Sinuses/Orbits: Visualized orbits show no acute finding. No significant paranasal sinus disease or mastoid effusion at the imaged levels. IMPRESSION: Unchanged 6.1 cm mass centered within the paramedian right parietooccipital lobes with surrounding edema as detailed above. Please refer to the prior examination of 08/02/2020 for differential considerations. Unchanged local mass effect with 3 mm leftward midline shift. Stable background generalized cerebral atrophy and chronic small vessel ischemic disease. Electronically Signed   By: Kellie Simmering DO   On: 08/04/2020 10:42   CT Head Wo Contrast  Addendum Date: 08/03/2020   ADDENDUM REPORT: 08/03/2020 13:19 ADDENDUM: Correction.  In the addendum the mass measures 4 x 6 cm, not mm. Electronically Signed   By: Franchot Gallo M.D.   On: 08/03/2020 13:19   Result Date: 08/03/2020 CLINICAL DATA:  Seizure EXAM: CT HEAD WITHOUT CONTRAST CT CERVICAL SPINE WITHOUT CONTRAST TECHNIQUE: Multidetector CT imaging of the head and cervical spine was performed following the standard protocol without intravenous contrast. Multiplanar CT image reconstructions of the cervical spine were also generated. COMPARISON:  CT head 12/30/2018 FINDINGS: CT HEAD FINDINGS Brain: Interval development of large mass in the right occipital lobe. This is mildly hyperdense to cortex. This appears infiltrative and  abuts the falx. The mass appears to cross the midline posterior to the splenium. There is edema in the splenium as well as in the white matter surrounding the mass. The mass measures approximately 6 x 4 cm. There is local mass-effect and mild midline shift to the left. No hydrocephalus. Negative for acute hemorrhage.  No acute infarct. Vascular: Negative for  hyperdense vessel Skull: Negative Sinuses/Orbits: Negative Other: None CT CERVICAL SPINE FINDINGS Alignment: 1 mm anterolisthesis C3-4. Skull base and vertebrae: Negative for fracture or mass. Soft tissues and spinal canal: Negative for mass or adenopathy. Atherosclerotic calcification in the carotid artery bilaterally. Probable significant stenosis in the carotid artery bilaterally. Disc levels: Multilevel disc degeneration and facet degeneration throughout the cervical spine with spurring and foraminal stenosis bilaterally due to spurring. Upper chest: Lung apices clear bilaterally. Other: None IMPRESSION: 1. 4 x 6 mm mass in the right occipital lobe with surrounding edema. The mass is mildly hyper dense to cortex. This is most likely a neoplasm. Favor glioblastoma. Differential diagnosis includes metastatic disease, lymphoma, meningioma. Recommend MRI brain without with contrast. 2. Negative for cervical spine fracture 3. Extensive atherosclerotic calcification of the carotid artery bilaterally, likely causing significant stenosis especially on the right. Electronically Signed: By: Franchot Gallo M.D. On: 08/02/2020 20:43   CT HEAD W & WO CONTRAST  Addendum Date: 08/03/2020   ADDENDUM REPORT: 08/03/2020 13:26 ADDENDUM: In addition, post-contrast imaging was performed which demonstrates irregular, peripheral enhancement of the mass. 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 Electronically Signed   By: Margaretha Sheffield MD   On: 08/03/2020 13:26   Result Date: 08/03/2020 CLINICAL DATA:  Brain CNS neoplasm. EXAM: CT HEAD WITHOUT AND WITH CONTRAST TECHNIQUE: Contiguous axial images were obtained from the base of the skull through the vertex without and with intravenous contrast CONTRAST:  164m OMNIPAQUE IOHEXOL 300 MG/ML  SOLN COMPARISON:  CT head 08/02/2020 FINDINGS: Brain: Similar appearance of a large mass in the right occipital lobe which is mildly hyperdense to cortex,  which measures approximately 6 x 4 cm. As before, the mass abuts the falx. Similar surrounding edema in the white matter and splenium of the corpus callosum. Similar local mass effect and mild leftward midline shift. No evidence of acute hemorrhage or acute large vascular territory infarct. No progressive ventricular enlargement. Vascular: Calcific atherosclerosis. Skull: Normal. Negative for fracture or focal lesion. Sinuses/Orbits: Clear sinuses.  No acute orbital abnormality. Other: No mastoid effusion. IMPRESSION: Similar size and appearance of the approximately 4 x 6 cm mass within the right occipital lobe with surrounding edema, as described on recent prior CT head. Similar mass effect. If the patient is able, MRI could further characterize. Electronically Signed: By: FMargaretha SheffieldMD On: 08/03/2020 13:03   CT CERVICAL SPINE WO CONTRAST  Result Date: 08/05/2020 CLINICAL DATA:  Craniotomy for tumor resection 08/04/2020. Fall today. EXAM: CT HEAD WITHOUT CONTRAST CT CERVICAL SPINE WITHOUT CONTRAST TECHNIQUE: Multidetector CT imaging of the head and cervical spine was performed following the standard protocol without intravenous contrast. Multiplanar CT image reconstructions of the cervical spine were also generated. COMPARISON:  CT head 08/04/2020.  CT cervical spine 08/02/2020 FINDINGS: CT HEAD FINDINGS Brain: Right parietal craniotomy for tumor resection. Large post resection cavity is present filled with fluid and gas as well as a small amount of blood. Postoperative blood in the cavity and surrounding tissues is unchanged from earlier today. Extra-axial fluid collection containing fluid and blood at the resection  site has improved. Intraventricular hemorrhage unchanged. Intraventricular gas unchanged. Mild ventricular enlargement unchanged. Mild pneumocephalus unchanged. Chronic microvascular ischemic changes are present. No acute ischemic infarct. Vascular: Negative for hyperdense vessel Skull:  Right parietal craniotomy. Sinuses/Orbits: Paranasal sinuses clear.  Negative orbit Other: None CT CERVICAL SPINE FINDINGS Alignment: Mild anterolisthesis C3-4 and C6-7 unchanged. Skull base and vertebrae: Negative for fracture or mass Soft tissues and spinal canal: No soft tissue mass or hematoma. Pacemaker leads on the left. Diffuse atherosclerotic disease. Extensive atherosclerotic calcification in the carotid artery bilaterally. Disc levels: Multilevel disc degeneration and spurring. Mild facet degeneration. Foraminal encroachment bilaterally due to spurring C4-5, C5-6, C6-7 Upper chest: Small subpleural blebs in the right apex. No acute abnormality. Other: None IMPRESSION: 1. 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 Large post resection cavity filled with fluid and small amount of blood unchanged from earlier today. Extra-axial fluid collection at the resection site is smaller. Intraventricular blood is stable. Pneumocephalus unchanged. No acute injury. 2. No acute cervical spine injury. Electronically Signed   By: Franchot Gallo M.D.   On: 08/05/2020 13:19   CT Cervical Spine Wo Contrast  Addendum Date: 08/03/2020   ADDENDUM REPORT: 08/03/2020 13:19 ADDENDUM: Correction.  In the addendum the mass measures 4 x 6 cm, not mm. Electronically Signed   By: Franchot Gallo M.D.   On: 08/03/2020 13:19   Result Date: 08/03/2020 CLINICAL DATA:  Seizure EXAM: CT HEAD WITHOUT CONTRAST CT CERVICAL SPINE WITHOUT CONTRAST TECHNIQUE: Multidetector CT imaging of the head and cervical spine was performed following the standard protocol without intravenous contrast. Multiplanar CT image reconstructions of the cervical spine were also generated. COMPARISON:  CT head 12/30/2018 FINDINGS: CT HEAD FINDINGS Brain: Interval development of large mass in the right occipital lobe. This is mildly hyperdense to cortex. This appears infiltrative and abuts the falx. The mass appears to cross the midline posterior to the  splenium. There is edema in the splenium as well as in the white matter surrounding the mass. The mass measures approximately 6 x 4 cm. There is local mass-effect and mild midline shift to the left. No hydrocephalus. Negative for acute hemorrhage.  No acute infarct. Vascular: Negative for hyperdense vessel Skull: Negative Sinuses/Orbits: Negative Other: None CT CERVICAL SPINE FINDINGS Alignment: 1 mm anterolisthesis C3-4. Skull base and vertebrae: Negative for fracture or mass. Soft tissues and spinal canal: Negative for mass or adenopathy. Atherosclerotic calcification in the carotid artery bilaterally. Probable significant stenosis in the carotid artery bilaterally. Disc levels: Multilevel disc degeneration and facet degeneration throughout the cervical spine with spurring and foraminal stenosis bilaterally due to spurring. Upper chest: Lung apices clear bilaterally. Other: None IMPRESSION: 1. 4 x 6 mm mass in the right occipital lobe with surrounding edema. The mass is mildly hyper dense to cortex. This is most likely a neoplasm. Favor glioblastoma. Differential diagnosis includes metastatic disease, lymphoma, meningioma. Recommend MRI brain without with contrast. 2. Negative for cervical spine fracture 3. Extensive atherosclerotic calcification of the carotid artery bilaterally, likely causing significant stenosis especially on the right. Electronically Signed: By: Franchot Gallo M.D. On: 08/02/2020 20:43   CT CHEST ABDOMEN PELVIS W CONTRAST  Result Date: 08/03/2020 CLINICAL DATA:  CNS neoplasm. Evaluate for primary or metastatic disease. EXAM: CT CHEST, ABDOMEN, AND PELVIS WITH CONTRAST TECHNIQUE: Multidetector CT imaging of the chest, abdomen and pelvis was performed following the standard protocol during bolus administration of intravenous contrast. CONTRAST:  177m OMNIPAQUE IOHEXOL 300 MG/ML  SOLN COMPARISON:  Chest CT 09/18/2019.  PET 09/13/2018. Abdominopelvic CT 12/01/2017. FINDINGS: CT CHEST FINDINGS  Cardiovascular: Aortic atherosclerosis. Tortuous thoracic aorta. Mild cardiomegaly, without pericardial effusion. Multivessel coronary artery atherosclerosis. Aortic valve calcification. Pacer. Mediastinum/Nodes: No axillary adenopathy. No mediastinal or hilar adenopathy. Tiny hiatal hernia. Lungs/Pleura: No pleural fluid.  Chronic left pleural calcification. Mild centrilobular and paraseptal emphysema. 3 mm right middle lobe pulmonary nodule on 75/4 is similar back to 11/30/2017 and can be presumed benign. A more central right middle lobe peribronchovascular 5 mm nodule on 96/4 is also present back to 11/30/2017, presumed benign. Musculoskeletal: No acute osseous abnormality. CT ABDOMEN PELVIS FINDINGS Hepatobiliary: Mild degradation secondary to arm position, not raised above the head. Normal liver. Normal gallbladder, without biliary ductal dilatation. Pancreas: Normal, without mass or ductal dilatation. Spleen: Normal in size, without focal abnormality. Adrenals/Urinary Tract: Normal adrenal glands. Normal kidneys, without hydronephrosis. Normal urinary bladder. Stomach/Bowel: Proximal gastric underdistention. Moderate stool within the rectum and remainder of the colon. Normal terminal ileum and appendix. Normal small bowel. Vascular/Lymphatic: Advanced aortic and branch vessel atherosclerosis. Significant calcification at the origin of the SMA, likely indicative of stenosis. No abdominopelvic adenopathy. Reproductive: Normal prostate. Other: No significant free fluid. No evidence of omental or peritoneal disease. Musculoskeletal: Degenerative partial fusion of the left sacroiliac joint. Lumbosacral spondylosis. IMPRESSION: 1. No acute process or evidence of primary malignancy/metastatic disease within the chest, abdomen, or pelvis. 2. Tiny hiatal hernia. 3. Aortic Atherosclerosis (ICD10-I70.0) and Emphysema (ICD10-J43.9). Coronary artery atherosclerosis. 4. Aortic valvular calcifications. Consider  echocardiography to evaluate for valvular dysfunction. 5. Mild degradation secondary to patient arm position, not raised above the head. Electronically Signed   By: Abigail Miyamoto M.D.   On: 08/03/2020 13:31    (Echo, Carotid, EGD, Colonoscopy, ERCP)    Subjective: Patient sitting up in bed eating breakfast in no acute distress no events overnight.  Discharge Exam: Vitals:   08/14/20 0730 08/14/20 1123  BP: (!) 126/51 (!) 122/53  Pulse: 72 77  Resp: 14 13  Temp: 98.4 F (36.9 C) 98 F (36.7 C)  SpO2: 95% 99%   Vitals:   08/14/20 0303 08/14/20 0500 08/14/20 0730 08/14/20 1123  BP: (!) 134/59  (!) 126/51 (!) 122/53  Pulse: 61  72 77  Resp: 20  14 13   Temp: 98.3 F (36.8 C)  98.4 F (36.9 C) 98 F (36.7 C)  TempSrc: Axillary  Oral Oral  SpO2: 90%  95% 99%  Weight:  75 kg    Height:        General: Pt is alert, awake, not in acute distress Cardiovascular: RRR, S1/S2 +, no rubs, no gallops Respiratory: CTA bilaterally, no wheezing, no rhonchi Abdominal: Soft, NT, ND, bowel sounds + Extremities: no edema, no cyanosis    The results of significant diagnostics from this hospitalization (including imaging, microbiology, ancillary and laboratory) are listed below for reference.     Microbiology: Recent Results (from the past 240 hour(s))  Aerobic/Anaerobic Culture (surgical/deep wound)     Status: None   Collection Time: 08/04/20  2:03 PM   Specimen: PATH Other; Tissue  Result Value Ref Range Status   Specimen Description TISSUE  Final   Special Requests RIGHT PARIETAL OCCIPITAL MASS SPEC A  Final   Gram Stain   Final    FEW WBC PRESENT, PREDOMINANTLY MONONUCLEAR NO ORGANISMS SEEN    Culture   Final    No growth aerobically or anaerobically. Performed at [facility], [place] [address], [place], [place] [address]    Report  Status 08/09/2020 FINAL  Final  SARS Coronavirus 2 by RT PCR (hospital order, performed in [facility] hospital lab) Nasopharyngeal  Nasopharyngeal Swab     Status: None   Collection Time: 08/13/20 12:24 PM   Specimen: Nasopharyngeal Swab  Result Value Ref Range Status   SARS Coronavirus 2 NEGATIVE NEGATIVE Final    Comment: (NOTE) SARS-CoV-2 target nucleic acids are NOT DETECTED.  The SARS-CoV-2 RNA is generally detectable in upper and lower respiratory specimens during the acute phase of infection. The lowest concentration of SARS-CoV-2 viral copies this assay can detect is 250 copies / mL. A negative result does not preclude SARS-CoV-2 infection and should not be used as the sole basis for treatment or other patient management decisions.  A negative result may occur with improper specimen collection / handling, submission of specimen other than nasopharyngeal swab, presence of viral mutation(s) within the areas targeted by this assay, and inadequate number of viral copies (<250 copies / mL). A negative result must be combined with clinical observations, patient history, and epidemiological information.  Fact Sheet for Patients:   StrictlyIdeas.no  Fact Sheet for Healthcare Providers: BankingDealers.co.za  This test is not yet approved or  cleared by the [place] FDA and has been authorized for detection and/or diagnosis of SARS-CoV-2 by FDA under an Emergency Use Authorization (EUA).  This EUA will remain in effect (meaning this test can be used) for the duration of the COVID-19 declaration under Section 564(b)(1) of the Act, 21 U.S.C. section 360bbb-3(b)(1), unless the authorization is terminated or revoked sooner.  Performed at [facility], [place] [address], [place], [place] [address]      Labs: BNP (last 3 results) No results for input(s): BNP in the last 8760 hours. Basic Metabolic Panel: Recent Labs  Lab 08/09/20 0626 08/13/20 1417  NA 136 133*  K 3.9 3.9  CL 103 101  CO2 23 22  GLUCOSE 142* 156*  BUN 23 24*  CREATININE 0.82 1.02   CALCIUM 9.2 8.2*   Liver Function Tests: Recent Labs  Lab 08/13/20 1417  AST 27  ALT 55*  ALKPHOS 49  BILITOT 0.5  PROT 5.1*  ALBUMIN 2.7*   No results for input(s): LIPASE, AMYLASE in the last 168 hours. No results for input(s): AMMONIA in the last 168 hours. CBC: Recent Labs  Lab 08/09/20 0626 08/13/20 1417  WBC 9.1 14.8*  HGB 10.7* 10.6*  HCT 31.2* 32.2*  MCV 87.6 89.4  PLT 208 261   Cardiac Enzymes: No results for input(s): CKTOTAL, CKMB, CKMBINDEX, TROPONINI in the last 168 hours. BNP: Invalid input(s): POCBNP CBG: Recent Labs  Lab 08/13/20 1117 08/13/20 1555 08/13/20 2144 08/14/20 0726 08/14/20 1122  GLUCAP 106* 157* 106* 80 114*   D-Dimer No results for input(s): DDIMER in the last 72 hours. Hgb A1c No results for input(s): HGBA1C in the last 72 hours. Lipid Profile No results for input(s): CHOL, HDL, LDLCALC, TRIG, CHOLHDL, LDLDIRECT in the last 72 hours. Thyroid function studies No results for input(s): TSH, T4TOTAL, T3FREE, THYROIDAB in the last 72 hours.  Invalid input(s): FREET3 Anemia work up No results for input(s): VITAMINB12, FOLATE, FERRITIN, TIBC, IRON, RETICCTPCT in the last 72 hours. Urinalysis    Component Value Date/Time   COLORURINE YELLOW 08/02/2020 2208   APPEARANCEUR CLEAR 08/02/2020 2208   LABSPEC 1.009 08/02/2020 2208   PHURINE 5.0 08/02/2020 [place] 08/02/2020 2208   HGBUR NEGATIVE 08/02/2020 2208   [place] 08/02/2020 2208  KETONESUR NEGATIVE 08/02/2020 2208   PROTEINUR NEGATIVE 08/02/2020 2208   UROBILINOGEN 0.2 08/13/2014 1010   NITRITE NEGATIVE 08/02/2020 2208   LEUKOCYTESUR NEGATIVE 08/02/2020 2208   Sepsis Labs Invalid input(s): PROCALCITONIN,  WBC,  LACTICIDVEN Microbiology Recent Results (from the past 240 hour(s))  Aerobic/Anaerobic Culture (surgical/deep wound)     Status: None   Collection Time: 08/04/20  2:03 PM   Specimen: PATH Other; Tissue  Result Value Ref Range  Status   Specimen Description TISSUE  Final   Special Requests RIGHT PARIETAL OCCIPITAL MASS SPEC A  Final   Gram Stain   Final    FEW WBC PRESENT, PREDOMINANTLY MONONUCLEAR NO ORGANISMS SEEN    Culture   Final    No growth aerobically or anaerobically. Performed at [facility], [place] [address] [place], [place] [address]    Report Status 08/09/2020 FINAL  Final  SARS Coronavirus 2 by RT PCR (hospital order, performed in [facility] hospital lab) Nasopharyngeal Nasopharyngeal Swab     Status: None   Collection Time: 08/13/20 12:24 PM   Specimen: Nasopharyngeal Swab  Result Value Ref Range Status   SARS Coronavirus 2 NEGATIVE NEGATIVE Final    Comment: (NOTE) SARS-CoV-2 target nucleic acids are NOT DETECTED.  The SARS-CoV-2 RNA is generally detectable in upper and lower respiratory specimens during the acute phase of infection. The lowest concentration of SARS-CoV-2 viral copies this assay can detect is 250 copies / mL. A negative result does not preclude SARS-CoV-2 infection and should not be used as the sole basis for treatment or other patient management decisions.  A negative result may occur with improper specimen collection / handling, submission of specimen other than nasopharyngeal swab, presence of viral mutation(s) within the areas targeted by this assay, and inadequate number of viral copies (<250 copies / mL). A negative result must be combined with clinical observations, patient history, and epidemiological information.  Fact Sheet for Patients:   StrictlyIdeas.no  Fact Sheet for Healthcare Providers: BankingDealers.co.za  This test is not yet approved or  cleared by the [place] FDA and has been authorized for detection and/or diagnosis of SARS-CoV-2 by FDA under an Emergency Use Authorization (EUA).  This EUA will remain in effect (meaning this test can be used) for the duration of the COVID-19  declaration under Section 564(b)(1) of the Act, 21 U.S.C. section 360bbb-3(b)(1), unless the authorization is terminated or revoked sooner.  Performed at [facility], [place] [address], [place], [place] [address]      Time coordinating discharge: 40 MIN SIGNED:   Georgette Shell, MD  [facility] 08/14/2020, 12:27 PM

## 2020-08-14 NOTE — Plan of Care (Signed)
Patient transitioning to SNF.

## 2020-08-14 NOTE — TOC Progression Note (Signed)
Transition of Care [facility]) - Progression Note    Patient Details  Name: Francisco [name] MRN: [id number] Date of Birth: 12/27/1945  Transition of Care [facility]) CM/SW [place], [place] Phone Number: 08/14/2020, 10:58 AM  Clinical Narrative:    Patient's discharge was delayed 10/8 due to low blood pressure. CSW is following over weekend to assist transportation and transfer of care to [place]. If patient improves well enough for discharge please reach out to [facility] to begin transfer process.    Expected Discharge Plan: [place] Barriers to Discharge: Continued Medical Work up, SNF Pending bed offer, Ship broker  Expected Discharge Plan and Services Expected Discharge Plan: [place] In-house Referral: Clinical Social Work     Living arrangements for the past 2 months: [place]                                       Social Determinants of Health (SDOH) Interventions    Readmission Risk Interventions No flowsheet data found.

## 2020-08-14 NOTE — Progress Notes (Signed)
Patient noted unable to stand and became unresponsive while being toileted. BP at time of episode was 75/42. Pt was  placed back in bed and became responsive shortly thereafter. Repeated BP 92/55. Provider notified. New orders received.

## 2020-08-14 NOTE — Progress Notes (Signed)
PROGRESS NOTE    DERAL SCHELLENBERG  HBZ:[id number] DOB: 05-12-1946 DOA: 08/02/2020 PCP: Lesia Hausen, PA   Brief Narrative: 74 year old male was at [facility] assisted living with past medical history of seizure disorder, alcohol abuse(in remission), hypertension, hyperlipidemia, celiac disease, diastolic congestive heart failure(EF 55-60% via Echo 2016),history of [place] parotid tumor(excised/radiated 08/2018),bilateral carotid stenosis,gout who presents to [facility] emergency department from his assisted living facility due to witnessed seizures. He had 3 seizures after which EMS was called and he was brought to the hospital. Upon eval in the ED he was found to have a right occipital lobe lesion measuring 6 cm x 4 cm with surrounding edema and evidence of midline shift to the left.  Neurosurgery recommended initiating Decadron.  Neurology recommended a IV Keppra bolus The patient was [facility]. After further history was obtained it was determined that the patient was having weakness and frequent falls over the past couple of weeks.  Also noted was peripheral vision loss.   Assessment & Plan:   Principal Problem:   Seizure with provoking factor ([place]) Active Problems:   Celiac disease   Essential hypertension   Chronic diastolic CHF (congestive heart failure) (HCC)   Seizures (HCC)   History of alcohol abuse   Neoplasm of brain causing mass effect on adjacent structures (HCC)   Seizure disorder ([place])   Mixed hyperlipidemia due to type 2 diabetes mellitus ([place])   Type 2 diabetes mellitus without complication, with long-term current use of insulin ([place])   Bilateral carotid artery stenosis  Seizure disorder- provoked seizures likely related to occipital mass -Due to incompatible pacemaker, MRIs cannot be obtained - 9/29- Right Parietal-occipital craniotomy and tumor resection-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 - on Decadron taper per NS - radiation oncology is aware of him and is arranging follow up Dr. Isidore Moos Patient was on his way out to go to skilled nursing facility today when he stood up to urinate he almost passed out and was put back in bed.  He became very pale per nursing staff when he stood up.  Discharge is canceled.  Check orthostatic start normal saline start midodrine get stat CT head to rule out any acute changes. I have stopped all antihypertensives.  I will also DC Flomax since it has a tendency to drop blood pressure.  Active Problems:  Acute encephalopathy (dementia at baseline?) - severe confusion occurring after surgery- he fell out of bed that night - ? If he is in alcohol withdrawal- history suggesting alcohol abuse  - ? If delirium related to steroids, anaesthesia or ICU stay - ? 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   Weight as of this encounter: 75 kg.  DVT prophylaxis: lovenox Code Status: dnr Family Communication: none at bed side Disposition Plan:  Status is: Inpatient  Dispo: The patient is from: ALF  Anticipated d/c is to: SNF              Anticipated d/c date is: unknown              Patient currently is not medically stable to be discharged   Consultants: ns  Procedures: Right parietal occipital craniotomy and tumor resection on 08/04/2020 Antimicrobials:  Anti-infectives (From admission,  onward)   Start     Dose/Rate Route Frequency Ordered Stop   08/04/20 1930  ceFAZolin (ANCEF) IVPB 1 g/50 mL premix        1 g 100 mL/hr over 30 Minutes Intravenous Every 8 hours 08/04/20 1921 08/05/20 1333   08/04/20 0800  ceFAZolin (ANCEF) IVPB 2g/100 mL premix        2 g 200 mL/hr over 30 Minutes Intravenous To [facility] 08/04/20 0713 08/04/20 1237     Subjective: He is eating breakfast by himself when I saw him earlier today in no acute distress  Objective: Vitals:   08/14/20 0730 08/14/20 1123 08/14/20 1451 08/14/20 1500  BP: (!) 126/51 (!) 122/53 (!) 75/42 (!) 92/55  Pulse: 72 77 (!) 28 85  Resp: 14 13    Temp: 98.4 F (36.9 C) 98 F (36.7 C)    TempSrc: Oral Oral    SpO2: 95% 99% 96% 100%  Weight:      Height:        Intake/Output Summary (Last 24 hours) at 08/14/2020 1514 Last data filed at 08/14/2020 1028 [place] per 24 hour  Intake 4038.04 ml  Output 1550 ml  Net 2488.04 ml   Filed Weights   08/12/20 0425 08/13/20 0500 08/14/20 0500  Weight: 70.3 kg 74.6 kg 75 kg    Examination:  General exam: Appears calm and comfortable  Respiratory system: Clear to auscultation. Respiratory effort normal. Cardiovascular system: S1 & S2 heard, RRR. No JVD, murmurs, rubs, gallops or clicks. No pedal edema. Gastrointestinal system: Abdomen is nondistended, soft and nontender. No organomegaly or masses felt. Normal bowel sounds heard. Central nervous system: Alert and oriented. No focal neurological deficits. Extremities: Symmetric 5 x 5 power. Skin: No rashes, lesions or ulcers Psychiatry: Unable to asses  Data Reviewed: I have personally reviewed following labs and imaging studies  CBC: Recent Labs  Lab 08/09/20 0626 08/13/20 1417  WBC 9.1 14.8*  HGB 10.7* 10.6*  HCT 31.2* 32.2*  MCV 87.6 89.4  PLT 208 924   Basic Metabolic Panel: Recent Labs  Lab 08/09/20 0626 08/13/20 1417  NA 136 133*  K 3.9 3.9  CL 103 101  CO2 23 22  GLUCOSE 142* 156*  BUN  23 24*  CREATININE 0.82 1.02  CALCIUM 9.2 8.2*   GFR: Estimated Creatinine Clearance: 63.5 mL/min (by C-G formula based on SCr of 1.02 mg/dL). Liver Function Tests: Recent Labs  Lab 08/13/20 1417  AST 27  ALT 55*  ALKPHOS 49  BILITOT 0.5  PROT 5.1*  ALBUMIN 2.7*   No results for input(s): LIPASE, AMYLASE in the last 168 hours. No results for input(s): AMMONIA in the last 168 hours. Coagulation Profile: No results for input(s): INR, PROTIME in the last 168 hours. Cardiac Enzymes: No results for input(s): CKTOTAL, CKMB, CKMBINDEX, TROPONINI in the last 168 hours. BNP (last 3 results) No results for input(s): PROBNP in the last 8760 hours. HbA1C: No results for input(s): HGBA1C in the last 72 hours. CBG: Recent Labs  Lab 08/13/20 1117 08/13/20 1555 08/13/20 2144 08/14/20 0726 08/14/20 1122  GLUCAP 106* 157* 106*  80 114*   Lipid Profile: No results for input(s): CHOL, HDL, LDLCALC, TRIG, CHOLHDL, LDLDIRECT in the last 72 hours. Thyroid Function Tests: No results for input(s): TSH, T4TOTAL, FREET4, T3FREE, THYROIDAB in the last 72 hours. Anemia Panel: No results for input(s): VITAMINB12, FOLATE, FERRITIN, TIBC, IRON, RETICCTPCT in the last 72 hours. Sepsis Labs: No results for input(s): PROCALCITON, LATICACIDVEN in the last 168 hours.  Recent Results (from the past 240 hour(s))  SARS Coronavirus 2 by RT PCR (hospital order, performed in [facility] hospital lab) Nasopharyngeal Nasopharyngeal Swab     Status: None   Collection Time: 08/13/20 12:24 PM   Specimen: Nasopharyngeal Swab  Result Value Ref Range Status   SARS Coronavirus 2 NEGATIVE NEGATIVE Final    Comment: (NOTE) SARS-CoV-2 target nucleic acids are NOT DETECTED.  The SARS-CoV-2 RNA is generally detectable in upper and lower respiratory specimens during the acute phase of infection. The lowest concentration of SARS-CoV-2 viral copies this assay can detect is 250 copies / mL. A negative result does not  preclude SARS-CoV-2 infection and should not be used as the sole basis for treatment or other patient management decisions.  A negative result may occur with improper specimen collection / handling, submission of specimen other than nasopharyngeal swab, presence of viral mutation(s) within the areas targeted by this assay, and inadequate number of viral copies (<250 copies / mL). A negative result must be combined with clinical observations, patient history, and epidemiological information.  Fact Sheet for Patients:   StrictlyIdeas.no  Fact Sheet for Healthcare Providers: BankingDealers.co.za  This test is not yet approved or  cleared by the [place] FDA and has been authorized for detection and/or diagnosis of SARS-CoV-2 by FDA under an Emergency Use Authorization (EUA).  This EUA will remain in effect (meaning this test can be used) for the duration of the COVID-19 declaration under Section 564(b)(1) of the Act, 21 U.S.C. section 360bbb-3(b)(1), unless the authorization is terminated or revoked sooner.  Performed at [facility], [place] [address], [place], [place] [address]          Radiology Studies: No results found.      Scheduled Meds: . allopurinol  300 mg Oral Daily  . atorvastatin  20 mg Oral Daily  . dexamethasone  1 mg Oral Q12H   Followed by  . [START ON 08/15/2020] dexamethasone  1 mg Oral Daily  . docusate sodium  100 mg Oral BID  . enoxaparin (LOVENOX) injection  40 mg Subcutaneous Q24H  . folic acid  1 mg Oral Daily  . insulin aspart  0-15 Units Subcutaneous TID WC  . levETIRAcetam  750 mg Oral BID  . midodrine  5 mg Oral TID WC  . nicotine  7 mg Transdermal Daily  . pantoprazole  40 mg Oral BID AC  . tamsulosin  0.4 mg Oral Daily  . thiamine  100 mg Oral Daily  . vitamin B-12  500 mcg Oral Daily   Continuous Infusions: . sodium chloride 150 mL/hr at 08/14/20 1028  . sodium chloride        LOS: 11 days   Georgette Shell, MD  08/14/2020, 3:14 PM

## 2020-08-15 DIAGNOSIS — I10 Essential (primary) hypertension: Secondary | ICD-10-CM | POA: Diagnosis not present

## 2020-08-15 LAB — GLUCOSE, CAPILLARY
Glucose-Capillary: 102 mg/dL — ABNORMAL HIGH (ref 70–99)
Glucose-Capillary: 129 mg/dL — ABNORMAL HIGH (ref 70–99)
Glucose-Capillary: 113 mg/dL — ABNORMAL HIGH (ref 70–99)
Glucose-Capillary: 134 mg/dL — ABNORMAL HIGH (ref 70–99)

## 2020-08-15 MED ORDER — MIDODRINE HCL 5 MG PO TABS
10.0000 mg | ORAL_TABLET | Freq: Three times a day (TID) | ORAL | Status: DC
  Administered 2020-08-15 – 2020-08-16 (×3): 10 mg via ORAL
  Filled 2020-08-15 (×3): qty 2

## 2020-08-15 NOTE — Progress Notes (Signed)
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 He had 3 seizures after which EMS was called and he was brought to the hospital. Upon eval in the ED he was found to have a right occipital lobe lesion measuring 6 cm x 4 cm with surrounding edema and evidence of midline shift to the left.  Neurosurgery recommended initiating Decadron.  Neurology recommended a IV Keppra bolus The patient was admitted to the triad hospitalist service. After further history was obtained it was determined that the patient was having weakness and frequent falls over the past couple of weeks.  Also noted was peripheral vision loss.   Assessment & Plan:   Principal Problem:   Seizure with provoking factor ([place]) Active Problems:   Celiac disease   Essential hypertension   Chronic diastolic CHF (congestive heart failure) (HCC)   Seizures (HCC)   History of alcohol abuse   Neoplasm of brain causing mass effect on adjacent structures (HCC)   Seizure disorder ([place])   Mixed hyperlipidemia due to type 2 diabetes mellitus ([place])   Type 2 diabetes mellitus without complication, with long-term current use of insulin ([place])   Bilateral carotid artery stenosis  Seizure disorder- provoked seizures likely related to occipital mass -Due to incompatible pacemaker, MRIs cannot be obtained - 9/29- Right Parietal-occipital craniotomy and tumor resection-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 - on Decadron taper per NS - radiation oncology is aware of him and is arranging follow up Dr. Isidore Moos Patient was on his way out to go to skilled nursing facility today when he stood up to urinate he almost passed out and was put back in bed.  He became very pale per nursing staff when he stood up.  Discharge was canceled. Patient remains orthostatic.  Continue IV fluids.  Increase midodrine to 10 mg 3 times a day.  Start TED hoses. CT head discussed with Dr. Venetia Constable.  MRI of the brain could not be done since patient has a pacemaker. Check EEG.  Continue Keppra.  CT head shows-Status post resection of tumor in the posterior right parietal and occipital lobe.  Irregular high density tissue along the falx and posterior corpus callosum is concerning for residual or recurrent tumor. MRI of the head without and with contrast may be helpful for further evaluation. Improved pneumocephalus. New CSF density extra-axial fluid collection on the left, measuring up to 4 mm. 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 check orthostatic start normal saline start midodrine get stat CT head to rule out any acute changes. I have stopped all antihypertensives.  I will also DC Flomax since it has a tendency to drop blood pressure.  Active Problems:  Acute encephalopathy (dementia at baseline?) - severe confusion occurring after surgery- he fell out of bed that night - ? If he is in alcohol withdrawal- history suggesting alcohol abuse  - ? If delirium related to steroids, anaesthesia or ICU stay - ? If due to narcotics - briefly on Precedex on 9/20 -plan: -  tapering steroids  - cont thiamine  Was given  seroquel made him very lethargic  Chronic diastolic CHF/pacemaker  (congestive heart failure) (HCC)-stable   Gout - cont allopurinol  DM2 - A1c is 6.9 - hold Metformin- sugars < 200  H/o left parotid cancer - s/p left parotid  radiation and parotid surgery  H/o PUD - cont PPI  BPH - Flomax stopped as patient hypotensive and orthostatic  Estimated body mass index is 24.15 kg/m as calculated from the following:   Height as of this encounter: 5' 9.02" (1.753 m).   Weight as of this encounter: 74.2 kg.  DVT prophylaxis: lovenox Code Status: dnr Family Communication: none at bed side Disposition Plan:  Status is: Inpatient  Dispo: The patient is from: ALF              Anticipated d/c is to: SNF              Anticipated d/c date is: unknown              Patient currently is not medically stable to be discharged   Consultants: ns  Procedures: Right parietal occipital craniotomy and tumor resection on 08/04/2020 Antimicrobials:  Anti-infectives (From admission, onward)   Start     Dose/Rate Route Frequency Ordered Stop   08/04/20 1930  ceFAZolin (ANCEF) IVPB 1 g/50 mL premix        1 g 100 mL/hr over 30 Minutes Intravenous Every 8 hours 08/04/20 1921 08/05/20 1333   08/04/20 0800  ceFAZolin (ANCEF) IVPB 2g/100 mL premix        2 g 200 mL/hr over 30 Minutes Intravenous To [facility] 08/04/20 0713 08/04/20 1237     Subjective: He is resting in bed with his eyes closed.  He is arousable easily.  Follows commands. Objective: Vitals:   08/15/20 1016 08/15/20 1019 08/15/20 1020 08/15/20 1023  BP: 114/62 (!) 97/56 (!) 114/43 (!) 93/58  Pulse:  (!) 35 (!) 102 87  Resp:      Temp:    98 F (36.7 C)  TempSrc:      SpO2:  98% (!) 81% 95%  Weight:      Height:        Intake/Output Summary (Last 24 hours) at 08/15/2020 1046 Last data filed at 08/15/2020 0900 Gross per 24 hour  Intake 1449.6 ml  Output 1000 ml  Net 449.6 ml   Filed Weights   08/13/20 0500 08/14/20 0500 08/15/20 0405  Weight: 74.6 kg 75 kg 74.2 kg    Examination:  General exam: Appears calm and comfortable  Respiratory system: Clear to auscultation. Respiratory effort normal. Cardiovascular system: S1 & S2 heard, RRR.  No JVD, murmurs, rubs, gallops or clicks. No pedal edema. Gastrointestinal system: Abdomen is nondistended, soft and nontender. No organomegaly or masses felt. Normal bowel sounds heard. Central nervous system: Alert and oriented. No focal neurological deficits. Extremities: Symmetric 5 x 5 power. Skin: No rashes, lesions or ulcers Psychiatry: Unable to asses  Data Reviewed: I have personally reviewed following labs and imaging studies  CBC: Recent Labs  Lab 08/09/20 0626 08/13/20 1417  WBC 9.1 14.8*  HGB 10.7* 10.6*  HCT 31.2* 32.2*  MCV 87.6 89.4  PLT 208 976   Basic Metabolic Panel: Recent Labs  Lab 08/09/20 0626 08/13/20 1417  NA 136 133*  K 3.9 3.9  CL 103 101  CO2 23 22  GLUCOSE 142* 156*  BUN 23 24*  CREATININE 0.82 1.02  CALCIUM 9.2 8.2*   GFR: Estimated Creatinine Clearance: 63.5 mL/min (by  C-G formula based on SCr of 1.02 mg/dL). Liver Function Tests: Recent Labs  Lab 08/13/20 1417  AST 27  ALT 55*  ALKPHOS 49  BILITOT 0.5  PROT 5.1*  ALBUMIN 2.7*   No results for input(s): LIPASE, AMYLASE in the last 168 hours. No results for input(s): AMMONIA in the last 168 hours. Coagulation Profile: No results for input(s): INR, PROTIME in the last 168 hours. Cardiac Enzymes: No results for input(s): CKTOTAL, CKMB, CKMBINDEX, TROPONINI in the last 168 hours. BNP (last 3 results) No results for input(s): PROBNP in the last 8760 hours. HbA1C: No results for input(s): HGBA1C in the last 72 hours. CBG: Recent Labs  Lab 08/14/20 0726 08/14/20 1122 08/14/20 1624 08/14/20 2111 08/15/20 0750  GLUCAP 80 114* 135* 121* 102*   Lipid Profile: No results for input(s): CHOL, HDL, LDLCALC, TRIG, CHOLHDL, LDLDIRECT in the last 72 hours. Thyroid Function Tests: No results for input(s): TSH, T4TOTAL, FREET4, T3FREE, THYROIDAB in the last 72 hours. Anemia Panel: No results for input(s): VITAMINB12, FOLATE, FERRITIN, TIBC, IRON, RETICCTPCT in the last 72  hours. Sepsis Labs: No results for input(s): PROCALCITON, LATICACIDVEN in the last 168 hours.  Recent Results (from the past 240 hour(s))  SARS Coronavirus 2 by RT PCR (hospital order, performed in [facility] hospital lab) Nasopharyngeal Nasopharyngeal Swab     Status: None   Collection Time: 08/13/20 12:24 PM   Specimen: Nasopharyngeal Swab  Result Value Ref Range Status   SARS Coronavirus 2 NEGATIVE NEGATIVE Final    Comment: (NOTE) SARS-CoV-2 target nucleic acids are NOT DETECTED.  The SARS-CoV-2 RNA is generally detectable in upper and lower respiratory specimens during the acute phase of infection. The lowest concentration of SARS-CoV-2 viral copies this assay can detect is 250 copies / mL. A negative result does not preclude SARS-CoV-2 infection and should not be used as the sole basis for treatment or other patient management decisions.  A negative result may occur with improper specimen collection / handling, submission of specimen other than nasopharyngeal swab, presence of viral mutation(s) within the areas targeted by this assay, and inadequate number of viral copies (<250 copies / mL). A negative result must be combined with clinical observations, patient history, and epidemiological information.  Fact Sheet for Patients:   StrictlyIdeas.no  Fact Sheet for Healthcare Providers: BankingDealers.co.za  This test is not yet approved or  cleared by the [place] FDA and has been authorized for detection and/or diagnosis of SARS-CoV-2 by FDA under an Emergency Use Authorization (EUA).  This EUA will remain in effect (meaning this test can be used) for the duration of the COVID-19 declaration under Section 564(b)(1) of the Act, 21 U.S.C. section 360bbb-3(b)(1), unless the authorization is terminated or revoked sooner.  Performed at [facility], [place] [address] [place], [place] [address]          Radiology  Studies: CT HEAD WO CONTRAST  Result Date: 08/14/2020 CLINICAL DATA:  Dizziness. Mental status changes. Status post resection of metastatic Merkel cell tumor 08/04/2020 EXAM: CT HEAD WITHOUT CONTRAST TECHNIQUE: Contiguous axial images were obtained from the base of the skull through the vertex without intravenous contrast. COMPARISON:  CT head without contrast 08/05/2020 FINDINGS: Brain: Patient is status post resection of tumor in the posterior right parietal and occipital lobe. Irregular high density tissue is present along the falx and posterior corpus callosum. Surgical cavity is noted. Extent of pneumocephalus is decreasing. There is still some mass effect on the right with partial effacement  of sulci posteriorly. CSF density extra-axial fluid collection is now present on the left, measuring up to 4 mm. The brainstem and cerebellum are within normal limits. Vascular: Atherosclerotic calcifications are present within the cavernous internal carotid arteries. No hyperdense vessel is present. Skull: Right parietal craniotomy is again noted. Calvarium is otherwise within normal limits. No significant extracranial soft tissue lesion is present. Sinuses/Orbits: The paranasal sinuses and mastoid air cells are clear. The globes and orbits are within normal limits. IMPRESSION: 1. Status post resection of tumor in the posterior right parietal and occipital lobe. 2. Irregular high density tissue along the falx and posterior corpus callosum is concerning for residual or recurrent tumor. MRI of the head without and with contrast may be helpful for further evaluation. 3. Improved pneumocephalus. 4. New CSF density extra-axial fluid collection on the left, measuring up to 4 mm. 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 Electronically Signed   By: San Morelle M.D.   On: 08/14/2020 17:30        Scheduled Meds: . allopurinol  300 mg Oral Daily  . atorvastatin  20 mg Oral Daily  . dexamethasone  1 mg  Oral Daily  . docusate sodium  100 mg Oral BID  . enoxaparin (LOVENOX) injection  40 mg Subcutaneous Q24H  . folic acid  1 mg Oral Daily  . insulin aspart  0-15 Units Subcutaneous TID WC  . levETIRAcetam  750 mg Oral BID  . midodrine  10 mg Oral TID WC  . nicotine  7 mg Transdermal Daily  . pantoprazole  40 mg Oral BID AC  . thiamine  100 mg Oral Daily  . vitamin B-12  500 mcg Oral Daily   Continuous Infusions: . sodium chloride 150 mL/hr at 08/14/20 1701  . sodium chloride 150 mL/hr at 08/14/20 1701     LOS: 12 days   Georgette Shell, MD  08/15/2020, 10:46 AM

## 2020-08-15 NOTE — Progress Notes (Signed)
Pt has Unsafe pacemaker model# [id number]. We will be unable to proceed with scan. Ordering said she is aware.

## 2020-08-15 NOTE — Progress Notes (Signed)
Call Charge RN @ 752am about patient. Patient has a Social research officer, government is looking up the model ID and calling rep to see if today is open to be able to get patient done

## 2020-08-16 ENCOUNTER — Inpatient Hospital Stay (HOSPITAL_COMMUNITY): Payer: Medicare HMO

## 2020-08-16 DIAGNOSIS — I10 Essential (primary) hypertension: Secondary | ICD-10-CM | POA: Diagnosis not present

## 2020-08-16 DIAGNOSIS — R569 Unspecified convulsions: Secondary | ICD-10-CM | POA: Diagnosis not present

## 2020-08-16 LAB — GLUCOSE, CAPILLARY
Glucose-Capillary: 81 mg/dL (ref 70–99)
Glucose-Capillary: 97 mg/dL (ref 70–99)

## 2020-08-16 MED ORDER — MIDODRINE HCL 10 MG PO TABS
5.0000 mg | ORAL_TABLET | Freq: Three times a day (TID) | ORAL | 0 refills | Status: DC
Start: 2020-08-16 — End: 2020-09-14

## 2020-08-16 NOTE — Progress Notes (Signed)
Removed 24 staples from crani

## 2020-08-16 NOTE — Progress Notes (Signed)
Physical Therapy Treatment Patient Details Name: Francisco [name] MRN: [id number] DOB: 1946/04/14 Today's Date: 08/16/2020    History of Present Illness 74 year old male with past medical history of seizure disorder, alcohol abuse (in remission), hypertension, hyperlipidemia, celiac disease, diastolic congestive heart failure (EF 55-60% via Echo 2016), history of [place] parotid tumor (excised/radiated 08/2018), bilateral carotid stenosis, gout who presents to [facility] emergency department from his assisted living facility due to witnessed seizures on 08/02/2020. Pt also reports progressive LE weakness with multiple falls over the past weeks as well as vision changes. CT head reveals new R occipital lesion with surrounding edema and evidence of midline shift. Pt underwent R parietal occipital craniotomy and tumor excision on 08/04/2020.    PT Comments    Patient able to take a few steps this session with chair following.  He has continued fatigue, but orthostatic measurements better than last session, but still positive with BP seated 128/63 standing 94/51.  Feel he would benefit from TED stockings as ordered per MD.  Pt will continue to benefit from skilled PT in the acute setting an follow up SNF level rehab at d/c.    Follow Up Recommendations  SNF     Equipment Recommendations  Wheelchair (measurements PT);Wheelchair cushion (measurements PT)    Recommendations for Other Services       Precautions / Restrictions Precautions Precautions: Fall Precaution Comments: Impuslive; fall out of recliner during admission; large L neglect/field cut, check BP    Mobility  Bed Mobility Overal bed mobility: Needs Assistance Bed Mobility: Supine to Sit     Supine to sit: Mod assist     General bed mobility comments: increased time and multimodal cues to move legs off the bed, assist to lift trunk and scoot to EOB  Transfers Overall transfer level: Needs assistance Equipment  used: Rolling walker (2 wheeled) Transfers: Sit to/from Stand Sit to Stand: Mod assist;+2 safety/equipment         General transfer comment: cues for hand placement, +2 for safety, some lifting help to stand  Ambulation/Gait Ambulation/Gait assistance: Mod assist;+2 safety/equipment Gait Distance (Feet): 5 Feet Assistive device: Rolling walker (2 wheeled) Gait Pattern/deviations: Step-to pattern;Decreased stride length;Shuffle;Ataxic     General Gait Details: shaky throughout, needing to sit due to fatigue, but also with some orthostatic symptoms (fatigue) with BP 128/63 sitting and 94/51 standing   Stairs             Wheelchair Mobility    Modified Rankin (Stroke Patients Only)       Balance Overall balance assessment: Needs assistance Sitting-balance support: Feet supported Sitting balance-Leahy Scale: Fair Sitting balance - Comments: S while seated at EOB   Standing balance support: Bilateral upper extremity supported Standing balance-Leahy Scale: Poor Standing balance comment: min A for balance with UE support                            Cognition Arousal/Alertness: Awake/alert Behavior During Therapy: WFL for tasks assessed/performed Overall Cognitive Status: Impaired/Different from baseline Area of Impairment: Attention;Memory;Following commands;Safety/judgement;Problem solving;Orientation                 Orientation Level: Disoriented to;Time Current Attention Level: Sustained Memory: Decreased short-term memory;Decreased recall of precautions Following Commands: Follows one step commands with increased time;Follows one step commands inconsistently Safety/Judgement: Decreased awareness of deficits Awareness: Emergent Problem Solving: Slow processing;Requires verbal cues;Requires tactile cues;Difficulty sequencing;Decreased initiation        Exercises  General Comments        Pertinent Vitals/Pain Pain Assessment:  Faces Faces Pain Scale: No hurt    Home Living                      Prior Function            PT Goals (current goals can now be found in the care plan section) Progress towards PT goals: Progressing toward goals    Frequency    Min 3X/week      PT Plan Current plan remains appropriate    Co-evaluation              AM-PAC PT "6 Clicks" Mobility   Outcome Measure  Help needed turning from your back to your side while in a flat bed without using bedrails?: A Lot Help needed moving from lying on your back to sitting on the side of a flat bed without using bedrails?: A Lot Help needed moving to and from a bed to a chair (including a wheelchair)?: Total Help needed standing up from a chair using your arms (e.g., wheelchair or bedside chair)?: Total Help needed to walk in hospital room?: Total Help needed climbing 3-5 steps with a railing? : Total 6 Click Score: 8    End of Session Equipment Utilized During Treatment: Gait belt Activity Tolerance: Patient limited by fatigue Patient left: in chair;with call bell/phone within reach;with chair alarm set Nurse Communication: Mobility status PT Visit Diagnosis: Unsteadiness on feet (R26.81);Other abnormalities of gait and mobility (R26.89);Muscle weakness (generalized) (M62.81);Repeated falls (R29.6);Other symptoms and signs involving the nervous system (R29.898)     Time: 8366-2947 PT Time Calculation (min) (ACUTE ONLY): 24 min  Charges:  $Gait Training: 8-22 mins $Therapeutic Activity: 8-22 mins                     Magda Kiel, PT Acute Rehabilitation Services Pager:[phone] Office:[phone] 08/16/2020    Reginia Naas 08/16/2020, 6:37 PM

## 2020-08-16 NOTE — Procedures (Signed)
Patient Name: Francisco [name]  MRN: [id number]  Epilepsy Attending: Lora Havens  Referring Physician/Provider: Dr. Jacki Cones Date: 08/16/2020 Duration: 24.30 minutes  Patient history: 74 year old male with right parieto-occipital craniotomy and tumor resection, epilepsy who presented with breakthrough seizure.  EEG to evaluate for seizure.  Level of alertness: Awake  AEDs during EEG study: Keppra  Technical aspects: This EEG study was done with scalp electrodes positioned according to the 10-20 International system of electrode placement. Electrical activity was acquired at a sampling rate of 500Hz  and reviewed with a high frequency filter of 70Hz  and a low frequency filter of 1Hz . EEG data were recorded continuously and digitally stored.   Description: The posterior dominant rhythm consists of 9 Hz activity of moderate voltage (25-35 uV) seen predominantly in posterior head regions, asymmetric ( R<L) and reactive to eye opening and eye closing.  EEG showed continuous 3 to 5 Hz theta-delta slowing in right parieto-occipital region.  Single sharp transient was seen in right occipital region.  Physiologic photic driving was not seen during photic stimulation.  Hyperventilation was not performed.     ABNORMALITY -Continuous slow, right parieto-occipital region -Background asymmetry, right less than left   IMPRESSION: This study is suggestive of cortical dysfunction in right parieto-occipital region consistent with underlying craniotomy.  No seizures or definite epileptiform discharges were seen throughout the recording.  Melah Ebling Barbra Sarks

## 2020-08-16 NOTE — Plan of Care (Signed)
Discharged to [facility]

## 2020-08-16 NOTE — TOC Transition Note (Signed)
Transition of Care [facility]) - CM/SW Discharge Note   Patient Details  Name: Francisco [name] MRN: [id number] Date of Birth: [birth year]  [facility] CM/SW Contact:  Emeterio Reeve, [place] Phone Number: 08/16/2020, 1:26 PM   Clinical Narrative:     Pt will d/c to [name] via [place]. PTs sister [name] notified.   Nurse to call report to  [phone].  Final next level of care: Skilled Nursing Facility Barriers to Discharge: Barriers Resolved   Patient Goals and CMS Choice        Discharge Placement              Patient chooses bed at: Other - please specify in the comment section below: ([name]) Patient to be transferred to facility by: [place] Name of family member notified: [name], sister Patient and family notified of of transfer: 08/16/20  Discharge Plan and Services In-house Referral: Clinical Social Work                                   Social Determinants of Health (SDOH) Interventions     Readmission Risk Interventions No flowsheet data found.  Emeterio Reeve, Latanya Presser, [place] Social Worker [phone]

## 2020-08-16 NOTE — Progress Notes (Signed)
EEG complete - results pending 

## 2020-08-16 NOTE — Plan of Care (Signed)
  Problem: Education: Goal: Knowledge of General Education information will improve Description: Including pain rating scale, medication(s)/side effects and non-pharmacologic comfort measures Outcome: Progressing   Problem: Skin Integrity: Goal: Risk for impaired skin integrity will decrease Outcome: Progressing   Problem: Pain Managment: Goal: General experience of comfort will improve Outcome: Progressing   Problem: Safety: Goal: Ability to remain free from injury will improve Outcome: Progressing

## 2020-08-17 NOTE — Progress Notes (Signed)
Location/Histology of Brain Tumor:  08/04/2020 FINAL MICROSCOPIC DIAGNOSIS:  A. BRAIN TUMOR, RIGHT PARIETAL, EXCISION:  - Metastatic Merkel cell carcinoma.  - See comment.  B. BRAIN TUMOR, RIGHT PARIETAL, EXCISION:  - Metastatic Merkel cell carcinoma.  - See comment  Patient presented with symptoms of:  to [facility] emergency department from his assisted living facility due to witnessed seizures. He had 3 seizures after which EMS was called and he was brought to the hospital. Upon eval in the ED he was found to have a right occipital lobe lesion measuring 6 cm x 4 cm with surrounding edema and evidence of midline shift to the left. After further history was obtained it was determined that the patient was having weakness and frequent falls over the past couple of weeks. Also noted was peripheral vision loss  Past or anticipated interventions, if any, per neurosurgery:  08/04/2020 Dr. Duffy Rhody RIGHT PARIETAL OCCIPITAL CRANIOTOMY TUMOR EXCISION APPLICATION OF CRANIAL NAVIGATION  Past or anticipated interventions, if any, per medical oncology:  No referral made  Dose of Decadron, if applicable: Was discharged from hospital on 78m daily for 4 days and then dose discontinued  Recent neurologic symptoms, if any:   Seizures: Not since surgery/discharge to SNF  Headaches: Patient reports occasional, minor headaches that he manages with naproxen sodium  Nausea: Patient denies  Dizziness/ataxia: Patient reports occasional dizziness when he's lying flat for bed  Difficulty with hand coordination: Unable to recall  Focal numbness/weakness: Patient reports mild weakness in legs  Visual deficits/changes: Patient denies  Confusion/Memory deficits: Patient denies, but [facility]  Bowel/Bladder retention or incontinence (please describe): Patient denied but aide with him states he has to wear a brief due to soiling  himself frequently  Ambulatory status? Walker? Wheelchair?: Came into clinic in a wheelchair and a appears to have coordination/balance issues.  SAFETY ISSUES:  Prior radiation? Yes: 10/07/2018 - 11/04/2018; Left Parotid / 50 Gy in 20 fractions of 2.5 Gy  Pacemaker/ICD? Yes  Possible current pregnancy? N/A  Is the patient on methotrexate? No  Additional Complaints / other details:  [facility] Aide accompanying him states he occasionally has outbursts but never acts aggressively towards staff. She reports he is able to feed himself and perform simple ADLs, but otherwise requires total care. He seems only oriented to self and occasionally reaches for things in front of himself that are not there.

## 2020-08-17 NOTE — Progress Notes (Signed)
Radiation Oncology         [phone]) [phone] ________________________________  Re-consultation Note  Name: [name] MRN: [id number]  Date: 08/18/2020  DOB: Feb 19, 1946  DJ:SHFW, Shanon Brow, PA  Helayne Seminole, MD   REFERRING PHYSICIAN: Helayne Seminole, MD  DIAGNOSIS:    ICD-10-CM   1. Brain metastases ([place])  C79.31   2. 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 He was last seen in follow-up on 04/07/2020, during which time there was no evidence of recurrence. He was to follow up with ENT in 4-6 months and then with me in 10 months.   Most recently, he presented to the ED on 08/02/2020 for evaluation of seizures. CT scan of head and cervical spine was performed at that time and showed a 4 x 6 cm mass in the right occipital lobe with surrounding edema. 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 Differential diagnoses included metastatic disease, lymphoma, and meningioma. The patient was admitted for further management/workup of seizure and brain mass.  While admitted, the patient underwent a right parietal occipital craniotomy tumor excision on 08/04/2020 under the care of Dr. Marcello Moores. Pathology from the procedure revealed metastatic Merkel Cell Carcinoma   The patient underwent additional imaging that included: 1. CT scan of chest/abdomen/pelvis on 08/03/2020 that did not show any acute processes or  evidence of primary malignancy/metastatic disease within the chest, abdomen, or pelvis. 2. CT scan of head on 08/03/2020 showing a similar size and appearance of the approximately 4 x 6 cm mass within the right occipital lobe with surrounding edema. Similar mass effect. Post-contrast imaging demonstrated irregular, peripheral enhancement of the mass. The favored diagnosis was a high grade glioma with meningioma felt to be less likely. 3. CT scan of head on 08/04/2020 showing an unchanged 6.1 cm mass centered within the paramedian right parietooccipital lobes with surrounding edema. Unchanged local mass effect with 3 mm of leftward midline shift. Stable background generalized cerebral atrophy and chronic small vessel ischemic disease. 4. CT scan of head on 08/05/2020 showing post-operative changes from interval right parietoocipital craniotomy and mass resection that included a small-volume hemorrhage along the periphery of the section cavity and adjacent falx also extending anteriorly within the interhemispheric fissure. There was also small-to-moderate volume intraventricular hemorrhage within the lateral and third ventricles. No evidence of hydrocephalus. Additionally, there was significant interval tumor debulking with apparent residual tumor along the inferior and deep aspects of the resection cavity and along the left aspect of the posterior falx. There was also an extra-axial fluid collection deep tot he right parietooccipital cranioplasty that measured 0.9 cm containing a small amount of acute hemorrhage. Finally, there was pneumocephalus along the anterior frontal lobes and within the right lateral ventricle frontal horn. 5. CT scan of head and cervical spine on 08/05/2020 showing post-op resection of right occipital parietal tumor. Large post-resection cavity was filled with  fluid and a small amount of blood that was unchanged. Extra-axial fluid collection at the resection site was smaller.  Intraventricular blood was stable. Pneumocephalus was unchanged. No acute injury. 6. CT scan of head on 08/14/2020 showed status-post resection of tumor in the posterior right parietal and occipital lobe. It also showed irregular high density tissue along the falx and posterior corpus callosum that was concerning for residual or recurrent tumor. Pneumocephalus was improved. 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  The patient was stabilized and discharged to [facility] on 50/27/7412.  Dose of Decadron, if applicable: Was discharged from hospital on 32m daily for 4 days and then dose discontinued  Recent neurologic symptoms, if any:   Seizures: Not since surgery/discharge to SNF  Headaches: Patient reports occasional, minor headaches that he manages with naproxen sodium  Nausea: Patient denies  Dizziness/ataxia: Patient reports occasional dizziness when he's lying flat for bed  Difficulty with hand coordination: Unable to recall  Focal numbness/weakness: Patient reports mild weakness in legs  Visual deficits/changes: Patient denies  Confusion/Memory deficits: Patient denies, but [facility]  Bowel/Bladder retention or incontinence (please describe): Patient denied but aide with him states he has to wear a brief due to soiling himself frequently  Ambulatory status? Walker? Wheelchair?: Came into clinic in a wheelchair and back has coordination/balance issues.  SAFETY ISSUES:  Prior radiation? Yes: 10/07/2018 - 11/04/2018; Left Parotid / 50 Gy in 20 fractions of 2.5 Gy  Pacemaker/ICD? Yes  Possible current pregnancy? N/A  Is the patient on methotrexate? No  Additional Complaints / other details:  [facility] him states he occasionally has outbursts but never  acts aggressively towards staff. She reports he is able to feed himself and perform simple ADLs, but otherwise requires total care. He seems only oriented to self and occasionally reaches for things in front of himself that are not there.   PREVIOUS RADIATION THERAPY: Yes  Radiation treatment dates: 10/07/18 - 11/04/18  Site/dose:   Parotid, Left neck / 50 Gy in 20 fractions of 2.5 Gy  Beams/energy:   IMRT, photon / 6X  PAST MEDICAL HISTORY:  has a past medical history of Anemia, Anginal pain ([place], Celiac disease, CHF (congestive heart failure) ([place], COPD (chronic obstructive pulmonary disease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    PAST SURGICAL HISTORY: Past Surgical History:  Procedure Laterality Date  . APPLICATION OF CRANIAL NAVIGATION Right 08/04/2020   Procedure: APPLICATION OF CRANIAL NAVIGATION;  Surgeon: TVallarie Mare MD;  [place] [facility]  Service: Neurosurgery;  Laterality: Right;  . CARDIAC CATHETERIZATION N/A 07/08/2015   Procedure: Left Heart Cath and Coronary Angiography;  Surgeon: TTroy Sine MD;  [facility];  Service: Cardiovascular;  Laterality: N/A;  . CARDIAC CATHETERIZATION N/A 07/08/2015   Procedure: Temporary Pacemaker;  Surgeon: TTroy Sine MD;  [facility];  Service: Cardiovascular;  Laterality: N/A;  . CARDIAC CATHETERIZATION N/A 07/09/2015   Procedure: Temporary Pacemaker;  Surgeon: TTroy Sine MD;  [place];  Service:  Cardiovascular;  Laterality: N/A;  . CRANIOTOMY Right 08/04/2020   Procedure: RIGHT PARIETAL OCCIPITAL CRANIOTOMY TUMOR EXCISION;   Surgeon: Vallarie Mare, MD;  Location: [place];  Service: Neurosurgery;  Laterality: Right;  . EP IMPLANTABLE DEVICE N/A 07/09/2015   Procedure: Pacemaker Implant;  Surgeon: Will Meredith Leeds, MD;  Location: [place] [facility] LAB;  Service: Cardiovascular;  Laterality: N/A;  . ESOPHAGOGASTRODUODENOSCOPY  07/03/2012   Procedure: ESOPHAGOGASTRODUODENOSCOPY (EGD);  Surgeon: Winfield Cunas., MD;  Location: [facility] ENDOSCOPY;  Service: Endoscopy;  Laterality: N/A;  . HEMORRHOID SURGERY    . HERNIA REPAIR    . MOLE REMOVAL    . PAROTIDECTOMY Left 08/21/2018   superficial  . PAROTIDECTOMY Left 08/21/2018   Procedure: LEFT SUPERFICIAL PAROTIDECTOMY;  Surgeon: Helayne Seminole, MD;  Location: [place];  Service: ENT;  Laterality: Left;    FAMILY HISTORY: family history includes Hodgkin's lymphoma in his father.  SOCIAL HISTORY:  reports that he has been smoking cigarettes. He has a 12.50 pack-year smoking history. He uses smokeless tobacco. He reports previous alcohol use. He reports that he does not use drugs.  ALLERGIES: Patient has no known allergies.  MEDICATIONS:  Current Outpatient Medications  Medication Sig Dispense Refill  . allopurinol (ZYLOPRIM) 300 MG tablet TAKE 1 TABLET(300 MG) BY MOUTH DAILY (Patient taking differently: Take 300 mg by mouth daily. ) 90 tablet 0  . atorvastatin (LIPITOR) 20 MG tablet Take 1 tablet (20 mg total) by mouth daily. 90 tablet 3  . colchicine 0.6 MG tablet Take 1 tablet (0.6 mg total) by mouth 2 (two) times daily as needed (gout flare). Take twice daily for 2 weeks, then use as needed for gout flare up 60 tablet 0  . guaiFENesin (MUCINEX) 600 MG 12 hr tablet Take 600-1,200 mg by mouth every 12 (twelve) hours as needed for cough.     Marland Kitchen guaiFENesin (ROBITUSSIN) 100 MG/5ML liquid Take 200 mg by mouth every 6 (six) hours as needed for cough.     . levETIRAcetam (KEPPRA) 750 MG tablet Take 1 tablet (750 mg total) by mouth 2 (two) times daily. 60 tablet 2  .  loperamide (IMODIUM A-D) 2 MG tablet Take 2 mg by mouth as needed for diarrhea or loose stools.     . magnesium hydroxide (MILK OF MAGNESIA) 400 MG/5ML suspension Take 30 mLs by mouth at bedtime as needed for mild constipation.    . naproxen sodium (ALEVE) 220 MG tablet Take 220 mg by mouth every 12 (twelve) hours as needed (joint pain).    Marland Kitchen omeprazole (PRILOSEC) 20 MG capsule Take 20 mg by mouth daily.    . ondansetron (ZOFRAN) 4 MG tablet Take 1 tablet (4 mg total) by mouth every 4 (four) hours as needed for nausea or vomiting. 20 tablet 0  . tamsulosin (FLOMAX) 0.4 MG CAPS capsule TAKE 1 CAPSULE(0.4 MG) BY MOUTH DAILY (Patient taking differently: Take 0.4 mg by mouth daily. ) 90 capsule 0  . clotrimazole-betamethasone (LOTRISONE) cream APPLY EXTERNALLY TO THE AFFECTED AREA TWICE DAILY (Patient not taking: No sig reported) 45 g 0  . dexamethasone (DECADRON) 1 MG tablet Take 1 tablet (1 mg total) by mouth daily. (Patient not taking: Reported on 08/18/2020) 4 tablet 0  . lidocaine (XYLOCAINE) 2 % solution Patient: Mix 1part 2% viscous lidocaine, 1part H20. Swish & swallow 62m of diluted mixture, 236m before meals and at bedtime, up to QID (Patient not taking: Reported on 04/04/2019) 200 mL 3  . midodrine (PROAMATINE) 10  MG tablet Take 0.5 tablets (5 mg total) by mouth 3 (three) times daily with meals. (Patient not taking: Reported on 08/18/2020) 60 tablet 0  . neomycin-bacitracin-polymyxin (NEOSPORIN) ointment Apply 1 application topically as needed for wound care. (Patient not taking: Reported on 08/18/2020)    . vitamin B-12 (CYANOCOBALAMIN) 500 MCG tablet Take 500 mcg by mouth daily. (Patient not taking: Reported on 08/18/2020)     No current facility-administered medications for this encounter.    REVIEW OF SYSTEMS:  Notable for that above.   PHYSICAL EXAM:  vitals were not taken for this visit.   General: He is a little bit somnolent but conversant when spoken to.  He is not oriented to  place or time.   HEENT: Surgical scar has healed over posterior scalp - Oropharynx is clear without thrush Musculoskeletal: He presents in a wheelchair  Neurologic: He is weak and deconditioned.  Cannot ambulate independently. He is not oriented to place or time     KPS =40  100 - Normal; no complaints; no evidence of disease. 90   - Able to carry on normal activity; minor signs or symptoms of disease. 80   - Normal activity with effort; some signs or symptoms of disease. 61   - Cares for self; unable to carry on normal activity or to do active work. 60   - Requires occasional assistance, but is able to care for most of his personal needs. 50   - Requires considerable assistance and frequent medical care. 8   - Disabled; requires special care and assistance. 38   - Severely disabled; hospital admission is indicated although death not imminent. 26   - Very sick; hospital admission necessary; active supportive treatment necessary. 10   - Moribund; fatal processes progressing rapidly. 0     - Dead  Karnofsky DA, Abelmann [place], Craver LS and Burchenal JH [phone]) The use of the nitrogen mustards in the palliative treatment of carcinoma: with particular reference to bronchogenic carcinoma Cancer 1 634-56   LABORATORY DATA:  Lab Results  Component Value Date   WBC 14.8 (H) 08/13/2020   HGB 10.6 (L) 08/13/2020   HCT 32.2 (L) 08/13/2020   MCV 89.4 08/13/2020   PLT 261 08/13/2020   CMP     Component Value Date/Time   NA 133 (L) 08/13/2020 1417   K 3.9 08/13/2020 1417   CL 101 08/13/2020 1417   CO2 22 08/13/2020 1417   GLUCOSE 156 (H) 08/13/2020 1417   BUN 24 (H) 08/13/2020 1417   CREATININE 1.02 08/13/2020 1417   CREATININE 1.00 10/04/2017 1513   CALCIUM 8.2 (L) 08/13/2020 1417   PROT 5.1 (L) 08/13/2020 1417   ALBUMIN 2.7 (L) 08/13/2020 1417   AST 27 08/13/2020 1417   ALT 55 (H) 08/13/2020 1417   ALKPHOS 49 08/13/2020 1417   BILITOT 0.5 08/13/2020 1417   GFRNONAA >60 08/13/2020  1417   GFRNONAA 75 10/04/2017 1513   GFRAA >60 08/09/2020 0626   GFRAA 87 10/04/2017 1513         RADIOGRAPHY: CT HEAD WO CONTRAST  Result Date: 08/14/2020 CLINICAL DATA:  Dizziness. Mental status changes. Status post resection of metastatic Merkel cell tumor 08/04/2020 EXAM: CT HEAD WITHOUT CONTRAST TECHNIQUE: Contiguous axial images were obtained from the base of the skull through the vertex without intravenous contrast. COMPARISON:  CT head without contrast 08/05/2020 FINDINGS: Brain: Patient is status post resection of tumor in the posterior right parietal and occipital lobe. Irregular high density tissue is  present along the falx and posterior corpus callosum. Surgical cavity is noted. Extent of pneumocephalus is decreasing. There is still some mass effect on the right with partial effacement of sulci posteriorly. CSF density extra-axial fluid collection is now present on the left, measuring up to 4 mm. The brainstem and cerebellum are within normal limits. Vascular: Atherosclerotic calcifications are present within the cavernous internal carotid arteries. No hyperdense vessel is present. Skull: Right parietal craniotomy is again noted. Calvarium is otherwise within normal limits. No significant extracranial soft tissue lesion is present. Sinuses/Orbits: The paranasal sinuses and mastoid air cells are clear. The globes and orbits are within normal limits. IMPRESSION: 1. Status post resection of tumor in the posterior right parietal and occipital lobe. 2. Irregular high density tissue along the falx and posterior corpus callosum is concerning for residual or recurrent tumor. MRI of the head without and with contrast may be helpful for further evaluation. 3. Improved pneumocephalus. 4. New CSF density extra-axial fluid collection on the left, measuring up to 4 mm. 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 Electronically Signed   By: San Morelle M.D.   On: 08/14/2020 17:30   CT HEAD WO  CONTRAST  Result Date: 08/05/2020 CLINICAL DATA:  Craniotomy for tumor resection 08/04/2020. Fall today. EXAM: CT HEAD WITHOUT CONTRAST CT CERVICAL SPINE WITHOUT CONTRAST TECHNIQUE: Multidetector CT imaging of the head and cervical spine was performed following the standard protocol without intravenous contrast. Multiplanar CT image reconstructions of the cervical spine were also generated. COMPARISON:  CT head 08/04/2020.  CT cervical spine 08/02/2020 FINDINGS: CT HEAD FINDINGS Brain: Right parietal craniotomy for tumor resection. Large post resection cavity is present filled with fluid and gas as well as a small amount of blood. Postoperative blood in the cavity and surrounding tissues is unchanged from earlier today. Extra-axial fluid collection containing fluid and blood at the resection site has improved. Intraventricular hemorrhage unchanged. Intraventricular gas unchanged. Mild ventricular enlargement unchanged. Mild pneumocephalus unchanged. Chronic microvascular ischemic changes are present. No acute ischemic infarct. Vascular: Negative for hyperdense vessel Skull: Right parietal craniotomy. Sinuses/Orbits: Paranasal sinuses clear.  Negative orbit Other: None CT CERVICAL SPINE FINDINGS Alignment: Mild anterolisthesis C3-4 and C6-7 unchanged. Skull base and vertebrae: Negative for fracture or mass Soft tissues and spinal canal: No soft tissue mass or hematoma. Pacemaker leads on the left. Diffuse atherosclerotic disease. Extensive atherosclerotic calcification in the carotid artery bilaterally. Disc levels: Multilevel disc degeneration and spurring. Mild facet degeneration. Foraminal encroachment bilaterally due to spurring C4-5, C5-6, C6-7 Upper chest: Small subpleural blebs in the right apex. No acute abnormality. Other: None IMPRESSION: 1. 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 Large post resection cavity filled with fluid and small amount of blood unchanged from earlier today. Extra-axial  fluid collection at the resection site is smaller. Intraventricular blood is stable. Pneumocephalus unchanged. No acute injury. 2. No acute cervical spine injury. Electronically Signed   By: Franchot Gallo M.D.   On: 08/05/2020 13:19   CT HEAD WO CONTRAST  Result Date: 08/05/2020 CLINICAL DATA:  Brain/CNS neoplasm, assess treatment response. EXAM: CT HEAD WITHOUT CONTRAST TECHNIQUE: Contiguous axial images were obtained from the base of the skull through the vertex without intravenous contrast. COMPARISON:  Prior head CT examinations 08/04/2020 and earlier. FINDINGS: Brain: Postoperative changes from interval right parietooccipital craniotomy and mass resection. There is small-volume hemorrhage along the periphery of the resection cavity as well as along the right aspect of the adjacent falx and extending anteriorly within the interhemispheric  fissure. Small to moderate volume intraventricular hemorrhage is also present within the lateral and third ventricles. A 1.6 cm focus of hemorrhage along the medial aspect of the posterior right lateral ventricle may be intraventricular or parenchymal (series 3, image 14). Bifrontal extra-axial pneumocephalus. Pneumocephalus is also present within the anterior right lateral ventricle. There is an extra-axial fluid collection deep to the right parietooccipital cranioplasty containing a small amount of acute hemorrhage measuring 0.9 cm in greatest thickness (series 3, image 21). There is apparent residual tumor along the inferior and deep margins of the resection cavity, as well as along the left aspect of the falx (for instance as seen on series 3, images 15-19). No midline shift. Stable generalized cerebral atrophy and chronic small vessel ischemic disease. Redemonstrated chronic lacunar infarcts within the left basal ganglia and thalamus. Vascular: No hyperdense vessel.  Atherosclerotic calcifications. Skull: Right parietooccipital cranioplasty. Overlying scalp soft  tissue swelling/edema and scalp staples. Sinuses/Orbits: Visualized orbits show no acute finding. Left mastoid effusion. IMPRESSION: Postoperative changes from interval right parietooccipital craniotomy and mass resection. This includes small-volume hemorrhage along the periphery of the resection cavity and adjacent falx, also extending anteriorly within the interhemispheric fissure. There is also small-to-moderate volume intraventricular hemorrhage within the lateral and third ventricles. No evidence of hydrocephalus at this time. There has been significant interval tumor debulking. Apparent residual tumor along the inferior and deep aspects of the resection cavity and along the left aspect of the posterior falx. Extra-axial fluid collection deep to the right parietooccipital cranioplasty measuring 0.9 cm containing a small amount of acute hemorrhage. Pneumocephalus along the anterior frontal lobes and within the right lateral ventricle frontal horn. Electronically Signed   By: Kellie Simmering DO   On: 08/05/2020 08:54   CT HEAD WO CONTRAST  Result Date: 08/04/2020 CLINICAL DATA:  Brain mass or lesion; brain lab protocol.  Seizure. EXAM: CT HEAD WITHOUT CONTRAST TECHNIQUE: Contiguous axial images were obtained from the base of the skull through the vertex without intravenous contrast. COMPARISON:  Prior head CT 08/03/2020 and head CT 08/02/2020. FINDINGS: Brain: Unchanged size of a mass centered within the paramedian right parietooccipital lobes. The mass measures 6.1 x 4.7 x 5.1 cm (AP x TV x CC). As before, the mass abuts the right aspect of the falx posteriorly and subtly crosses midline. Also unchanged, the mass encroaches upon the posterior aspect of the right lateral ventricle. Unchanged surrounding edema within the posterior right cerebral white matter and callosal splenium. Unchanged local mass effect. Stable leftward midline shift at the level of the septum pellucidum measuring 3 mm. No evidence of acute  intracranial hemorrhage. No extra-axial fluid collection. Stable background mild generalized cerebral atrophy and chronic small vessel ischemic disease. Redemonstrated chronic lacunar infarcts within the left basal ganglia and thalamus. Vascular: No hyperdense vessel.  Atherosclerotic calcifications. Skull: Normal. Negative for fracture or focal lesion. Sinuses/Orbits: Visualized orbits show no acute finding. No significant paranasal sinus disease or mastoid effusion at the imaged levels. IMPRESSION: Unchanged 6.1 cm mass centered within the paramedian right parietooccipital lobes with surrounding edema as detailed above. Please refer to the prior examination of 08/02/2020 for differential considerations. Unchanged local mass effect with 3 mm leftward midline shift. Stable background generalized cerebral atrophy and chronic small vessel ischemic disease. Electronically Signed   By: Kellie Simmering DO   On: 08/04/2020 10:42   CT Head Wo Contrast  Addendum Date: 08/03/2020   ADDENDUM REPORT: 08/03/2020 13:19 ADDENDUM: Correction.  In the addendum the mass measures  4 x 6 cm, not mm. Electronically Signed   By: Franchot Gallo M.D.   On: 08/03/2020 13:19   Result Date: 08/03/2020 CLINICAL DATA:  Seizure EXAM: CT HEAD WITHOUT CONTRAST CT CERVICAL SPINE WITHOUT CONTRAST TECHNIQUE: Multidetector CT imaging of the head and cervical spine was performed following the standard protocol without intravenous contrast. Multiplanar CT image reconstructions of the cervical spine were also generated. COMPARISON:  CT head 12/30/2018 FINDINGS: CT HEAD FINDINGS Brain: Interval development of large mass in the right occipital lobe. This is mildly hyperdense to cortex. This appears infiltrative and abuts the falx. The mass appears to cross the midline posterior to the splenium. There is edema in the splenium as well as in the white matter surrounding the mass. The mass measures approximately 6 x 4 cm. There is local mass-effect and mild  midline shift to the left. No hydrocephalus. Negative for acute hemorrhage.  No acute infarct. Vascular: Negative for hyperdense vessel Skull: Negative Sinuses/Orbits: Negative Other: None CT CERVICAL SPINE FINDINGS Alignment: 1 mm anterolisthesis C3-4. Skull base and vertebrae: Negative for fracture or mass. Soft tissues and spinal canal: Negative for mass or adenopathy. Atherosclerotic calcification in the carotid artery bilaterally. Probable significant stenosis in the carotid artery bilaterally. Disc levels: Multilevel disc degeneration and facet degeneration throughout the cervical spine with spurring and foraminal stenosis bilaterally due to spurring. Upper chest: Lung apices clear bilaterally. Other: None IMPRESSION: 1. 4 x 6 mm mass in the right occipital lobe with surrounding edema. The mass is mildly hyper dense to cortex. This is most likely a neoplasm. Favor glioblastoma. Differential diagnosis includes metastatic disease, lymphoma, meningioma. Recommend MRI brain without with contrast. 2. Negative for cervical spine fracture 3. Extensive atherosclerotic calcification of the carotid artery bilaterally, likely causing significant stenosis especially on the right. Electronically Signed: By: Franchot Gallo M.D. On: 08/02/2020 20:43   CT HEAD W & WO CONTRAST  Addendum Date: 08/03/2020   ADDENDUM REPORT: 08/03/2020 13:26 ADDENDUM: In addition, post-contrast imaging was performed which demonstrates irregular, peripheral enhancement of the mass. 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 Electronically Signed   By: Margaretha Sheffield MD   On: 08/03/2020 13:26   Result Date: 08/03/2020 CLINICAL DATA:  Brain CNS neoplasm. EXAM: CT HEAD WITHOUT AND WITH CONTRAST TECHNIQUE: Contiguous axial images were obtained from the base of the skull through the vertex without and with intravenous contrast CONTRAST:  161m OMNIPAQUE IOHEXOL 300 MG/ML  SOLN COMPARISON:  CT head 08/02/2020  FINDINGS: Brain: Similar appearance of a large mass in the right occipital lobe which is mildly hyperdense to cortex, which measures approximately 6 x 4 cm. As before, the mass abuts the falx. Similar surrounding edema in the white matter and splenium of the corpus callosum. Similar local mass effect and mild leftward midline shift. No evidence of acute hemorrhage or acute large vascular territory infarct. No progressive ventricular enlargement. Vascular: Calcific atherosclerosis. Skull: Normal. Negative for fracture or focal lesion. Sinuses/Orbits: Clear sinuses.  No acute orbital abnormality. Other: No mastoid effusion. IMPRESSION: Similar size and appearance of the approximately 4 x 6 cm mass within the right occipital lobe with surrounding edema, as described on recent prior CT head. Similar mass effect. If the patient is able, MRI could further characterize. Electronically Signed: By: FMargaretha SheffieldMD On: 08/03/2020 13:03   CT CERVICAL SPINE WO CONTRAST  Result Date: 08/05/2020 CLINICAL DATA:  Craniotomy for tumor resection 08/04/2020. Fall today. EXAM: CT HEAD WITHOUT  CONTRAST CT CERVICAL SPINE WITHOUT CONTRAST TECHNIQUE: Multidetector CT imaging of the head and cervical spine was performed following the standard protocol without intravenous contrast. Multiplanar CT image reconstructions of the cervical spine were also generated. COMPARISON:  CT head 08/04/2020.  CT cervical spine 08/02/2020 FINDINGS: CT HEAD FINDINGS Brain: Right parietal craniotomy for tumor resection. Large post resection cavity is present filled with fluid and gas as well as a small amount of blood. Postoperative blood in the cavity and surrounding tissues is unchanged from earlier today. Extra-axial fluid collection containing fluid and blood at the resection site has improved. Intraventricular hemorrhage unchanged. Intraventricular gas unchanged. Mild ventricular enlargement unchanged. Mild pneumocephalus unchanged. Chronic  microvascular ischemic changes are present. No acute ischemic infarct. Vascular: Negative for hyperdense vessel Skull: Right parietal craniotomy. Sinuses/Orbits: Paranasal sinuses clear.  Negative orbit Other: None CT CERVICAL SPINE FINDINGS Alignment: Mild anterolisthesis C3-4 and C6-7 unchanged. Skull base and vertebrae: Negative for fracture or mass Soft tissues and spinal canal: No soft tissue mass or hematoma. Pacemaker leads on the left. Diffuse atherosclerotic disease. Extensive atherosclerotic calcification in the carotid artery bilaterally. Disc levels: Multilevel disc degeneration and spurring. Mild facet degeneration. Foraminal encroachment bilaterally due to spurring C4-5, C5-6, C6-7 Upper chest: Small subpleural blebs in the right apex. No acute abnormality. Other: None IMPRESSION: 1. 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 Large post resection cavity filled with fluid and small amount of blood unchanged from earlier today. Extra-axial fluid collection at the resection site is smaller. Intraventricular blood is stable. Pneumocephalus unchanged. No acute injury. 2. No acute cervical spine injury. Electronically Signed   By: Franchot Gallo M.D.   On: 08/05/2020 13:19   CT Cervical Spine Wo Contrast  Addendum Date: 08/03/2020   ADDENDUM REPORT: 08/03/2020 13:19 ADDENDUM: Correction.  In the addendum the mass measures 4 x 6 cm, not mm. Electronically Signed   By: Franchot Gallo M.D.   On: 08/03/2020 13:19   Result Date: 08/03/2020 CLINICAL DATA:  Seizure EXAM: CT HEAD WITHOUT CONTRAST CT CERVICAL SPINE WITHOUT CONTRAST TECHNIQUE: Multidetector CT imaging of the head and cervical spine was performed following the standard protocol without intravenous contrast. Multiplanar CT image reconstructions of the cervical spine were also generated. COMPARISON:  CT head 12/30/2018 FINDINGS: CT HEAD FINDINGS Brain: Interval development of large mass in the right occipital lobe. This is mildly  hyperdense to cortex. This appears infiltrative and abuts the falx. The mass appears to cross the midline posterior to the splenium. There is edema in the splenium as well as in the white matter surrounding the mass. The mass measures approximately 6 x 4 cm. There is local mass-effect and mild midline shift to the left. No hydrocephalus. Negative for acute hemorrhage.  No acute infarct. Vascular: Negative for hyperdense vessel Skull: Negative Sinuses/Orbits: Negative Other: None CT CERVICAL SPINE FINDINGS Alignment: 1 mm anterolisthesis C3-4. Skull base and vertebrae: Negative for fracture or mass. Soft tissues and spinal canal: Negative for mass or adenopathy. Atherosclerotic calcification in the carotid artery bilaterally. Probable significant stenosis in the carotid artery bilaterally. Disc levels: Multilevel disc degeneration and facet degeneration throughout the cervical spine with spurring and foraminal stenosis bilaterally due to spurring. Upper chest: Lung apices clear bilaterally. Other: None IMPRESSION: 1. 4 x 6 mm mass in the right occipital lobe with surrounding edema. The mass is mildly hyper dense to cortex. This is most likely a neoplasm. Favor glioblastoma. Differential diagnosis includes metastatic disease, lymphoma, meningioma. Recommend MRI brain without with contrast. 2.  Negative for cervical spine fracture 3. Extensive atherosclerotic calcification of the carotid artery bilaterally, likely causing significant stenosis especially on the right. Electronically Signed: By: Franchot Gallo M.D. On: 08/02/2020 20:43   CT CHEST ABDOMEN PELVIS W CONTRAST  Result Date: 08/03/2020 CLINICAL DATA:  CNS neoplasm. Evaluate for primary or metastatic disease. EXAM: CT CHEST, ABDOMEN, AND PELVIS WITH CONTRAST TECHNIQUE: Multidetector CT imaging of the chest, abdomen and pelvis was performed following the standard protocol during bolus administration of intravenous contrast. CONTRAST:  165m OMNIPAQUE IOHEXOL  300 MG/ML  SOLN COMPARISON:  Chest CT 09/18/2019. PET 09/13/2018. Abdominopelvic CT 12/01/2017. FINDINGS: CT CHEST FINDINGS Cardiovascular: Aortic atherosclerosis. Tortuous thoracic aorta. Mild cardiomegaly, without pericardial effusion. Multivessel coronary artery atherosclerosis. Aortic valve calcification. Pacer. Mediastinum/Nodes: No axillary adenopathy. No mediastinal or hilar adenopathy. Tiny hiatal hernia. Lungs/Pleura: No pleural fluid.  Chronic left pleural calcification. Mild centrilobular and paraseptal emphysema. 3 mm right middle lobe pulmonary nodule on 75/4 is similar back to 11/30/2017 and can be presumed benign. A more central right middle lobe peribronchovascular 5 mm nodule on 96/4 is also present back to 11/30/2017, presumed benign. Musculoskeletal: No acute osseous abnormality. CT ABDOMEN PELVIS FINDINGS Hepatobiliary: Mild degradation secondary to arm position, not raised above the head. Normal liver. Normal gallbladder, without biliary ductal dilatation. Pancreas: Normal, without mass or ductal dilatation. Spleen: Normal in size, without focal abnormality. Adrenals/Urinary Tract: Normal adrenal glands. Normal kidneys, without hydronephrosis. Normal urinary bladder. Stomach/Bowel: Proximal gastric underdistention. Moderate stool within the rectum and remainder of the colon. Normal terminal ileum and appendix. Normal small bowel. Vascular/Lymphatic: Advanced aortic and branch vessel atherosclerosis. Significant calcification at the origin of the SMA, likely indicative of stenosis. No abdominopelvic adenopathy. Reproductive: Normal prostate. Other: No significant free fluid. No evidence of omental or peritoneal disease. Musculoskeletal: Degenerative partial fusion of the left sacroiliac joint. Lumbosacral spondylosis. IMPRESSION: 1. No acute process or evidence of primary malignancy/metastatic disease within the chest, abdomen, or pelvis. 2. Tiny hiatal hernia. 3. Aortic Atherosclerosis  (ICD10-I70.0) and Emphysema (ICD10-J43.9). Coronary artery atherosclerosis. 4. Aortic valvular calcifications. Consider echocardiography to evaluate for valvular dysfunction. 5. Mild degradation secondary to patient arm position, not raised above the head. Electronically Signed   By: KAbigail MiyamotoM.D.   On: 08/03/2020 13:31   EEG adult  Result Date: 08/16/2020 YLora Havens MD     08/16/2020 11:15 AM Patient Name: Francisco GRISWOLDMRN: [id number] Attending: PLora HavensReferring Physician/Provider: Dr. EJacki ConesDate: 08/16/2020 Duration: 24.30 minutes Patient history: 74year old male with right parieto-occipital craniotomy and tumor resection, epilepsy who presented with breakthrough seizure.  EEG to evaluate for seizure. Level of alertness: Awake AEDs during EEG study: [place] Technical aspects: This EEG study was done with scalp electrodes positioned according to the 10-20 International system of electrode placement. Electrical activity was acquired at a sampling rate of 500Hz  and reviewed with a high frequency filter of 70Hz  and a low frequency filter of 1Hz . EEG data were recorded continuously and digitally stored. Description: The posterior dominant rhythm consists of 9 Hz activity of moderate voltage (25-35 uV) seen predominantly in posterior head regions, asymmetric ( R<L) and reactive to eye opening and eye closing.  EEG showed continuous 3 to 5 Hz theta-delta slowing in right parieto-occipital region.  Single sharp transient was seen in right occipital region.  Physiologic photic driving was not seen during photic stimulation.  Hyperventilation was not performed.   ABNORMALITY -Continuous slow, right parieto-occipital region -Background asymmetry, right less than left  IMPRESSION: This study is suggestive of cortical dysfunction in right parieto-occipital region consistent with underlying craniotomy.  No seizures or definite epileptiform discharges were seen throughout the  recording. 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  Mardene Celeste spoke with [place] by speaker phone.  The patient's sister, Lisbeth Ply, could not be reached.  She is the one who has power of attorney.  Mardene Celeste states that Lisbeth Ply is very reasonable but that she is somewhat estranged from her brother.  She reports that [name] will call back since we left her a voicemail.  The patient was not able to participate very much in the discussion due to his cognitive decline.  Unfortunately, Mr. Dini has declined significantly since I saw him earlier this year.  I explained to the patient's caregiver and his sister that the standard adjuvant treatment to prevent another relapse of cancer in his brain would be 2 to 3 weeks of whole brain radiation therapy.  However, I believe the risks are likely greater than the potential benefits of this treatment.  I have concerns that the patient would become profoundly fatigued from this treatment and that he would continue to be fatigued for a couple months.  He could experience nausea or headaches.  There is a risk of brain injury/irritation from this treatment as well.  I explained that if he did pursue whole brain radiation therapy it would be palliative  / prophylactic; unfortunately cure of his disease is not realistic.  Given his very limited performance status, I believe that comfort measures and/or hospice should be strongly considered.  I would estimate his life expectancy to be 6 months even if he undergoes radiation treatment. The patient's sister Mardene Celeste) is in agreement with the palliative care consult and strongly considering hospice.  I have made a referral to palliative medicine and I hope that they will be able to reach the patient's Sister [name]; unfortunately the patient could not stay to see a palliative care specialist this afternoon (he repeatedly asked  to be able to go home and get back to a warm bed).  Katelin, the nurse on our service, will contact palliative medicine and/or the patient's family if she does not hear about the disposition within the next 2 weeks.  I filled out paperwork for the patient's skilled nursing facility about the plan above.  On date of service, in total, I spent 50 minutes on this encounter. Patient was seen in person.   __________________________________________   Eppie Gibson, MD  This document serves as a record of services personally performed by Eppie Gibson, MD. It was created on his behalf by Clerance Lav, a trained medical scribe. The creation of this record is based on the scribe's personal observations and the provider's statements to them. This document has been checked and approved by the attending provider.

## 2020-08-18 ENCOUNTER — Ambulatory Visit
Admission: RE | Admit: 2020-08-18 | Discharge: 2020-08-18 | Disposition: A | Discharge status: Home / Self Care | Payer: Medicare HMO | Source: Ambulatory Visit | Attending: Radiation Oncology | Admitting: Radiation Oncology

## 2020-08-18 ENCOUNTER — Encounter: Payer: Self-pay | Admitting: Radiation Oncology

## 2020-08-18 ENCOUNTER — Ambulatory Visit: Payer: Medicare HMO | Admitting: Radiation Oncology

## 2020-08-18 ENCOUNTER — Inpatient Hospital Stay: Payer: Medicare HMO | Attending: Internal Medicine

## 2020-08-18 ENCOUNTER — Other Ambulatory Visit: Payer: Self-pay

## 2020-08-18 DIAGNOSIS — C7931 Secondary malignant neoplasm of brain: Secondary | ICD-10-CM | POA: Insufficient documentation

## 2020-08-18 DIAGNOSIS — F17210 Nicotine dependence, cigarettes, uncomplicated: Secondary | ICD-10-CM | POA: Diagnosis not present

## 2020-08-18 DIAGNOSIS — Z923 Personal history of irradiation: Secondary | ICD-10-CM | POA: Diagnosis not present

## 2020-08-18 DIAGNOSIS — K900 Celiac disease: Secondary | ICD-10-CM | POA: Insufficient documentation

## 2020-08-18 DIAGNOSIS — G40909 Epilepsy, unspecified, not intractable, without status epilepticus: Secondary | ICD-10-CM | POA: Diagnosis not present

## 2020-08-18 DIAGNOSIS — R270 Ataxia, unspecified: Secondary | ICD-10-CM | POA: Insufficient documentation

## 2020-08-18 DIAGNOSIS — K219 Gastro-esophageal reflux disease without esophagitis: Secondary | ICD-10-CM | POA: Insufficient documentation

## 2020-08-18 DIAGNOSIS — R531 Weakness: Secondary | ICD-10-CM | POA: Insufficient documentation

## 2020-08-18 DIAGNOSIS — J449 Chronic obstructive pulmonary disease, unspecified: Secondary | ICD-10-CM | POA: Diagnosis not present

## 2020-08-18 DIAGNOSIS — C07 Malignant neoplasm of parotid gland: Secondary | ICD-10-CM | POA: Diagnosis not present

## 2020-08-18 DIAGNOSIS — I110 Hypertensive heart disease with heart failure: Secondary | ICD-10-CM | POA: Diagnosis not present

## 2020-08-18 DIAGNOSIS — Z806 Family history of leukemia: Secondary | ICD-10-CM | POA: Diagnosis not present

## 2020-08-18 DIAGNOSIS — Z79899 Other long term (current) drug therapy: Secondary | ICD-10-CM | POA: Diagnosis not present

## 2020-08-18 DIAGNOSIS — I509 Heart failure, unspecified: Secondary | ICD-10-CM | POA: Diagnosis not present

## 2020-08-26 ENCOUNTER — Other Ambulatory Visit: Payer: Self-pay | Admitting: Radiation Therapy

## 2020-08-27 ENCOUNTER — Telehealth: Payer: Self-pay

## 2020-08-27 ENCOUNTER — Other Ambulatory Visit: Payer: Self-pay | Admitting: Radiation Therapy

## 2020-08-27 ENCOUNTER — Other Ambulatory Visit: Payer: Self-pay

## 2020-08-27 DIAGNOSIS — C4A9 Merkel cell carcinoma, unspecified: Secondary | ICD-10-CM

## 2020-08-27 DIAGNOSIS — C7931 Secondary malignant neoplasm of brain: Secondary | ICD-10-CM

## 2020-08-27 NOTE — Telephone Encounter (Signed)
Called patient's sister Lisbeth Ply Rabek) to see if she was available to speak with Dr. Lane Hacker about Hospice care for patient. Gave her times Dr. Hilma Favors was available for phone call today and Monday 08/30/20. Provided direct call back number for [name] to call back and let me know her preference.   [name] returned call and left a VM stating she would be available at 16:00 today to speak with Dr. Hilma Favors. She also mentioned that she spoke with [facility] last week about getting patient set up with Hospice care through the facility, but would still appreciate speaking with Dr. Hilma Favors and getting her input/advice.   Message sent to Dr. Hilma Favors with above information. Will continue to support as needed.

## 2020-09-07 DIAGNOSIS — G9341 Metabolic encephalopathy: Secondary | ICD-10-CM

## 2020-09-10 ENCOUNTER — Other Ambulatory Visit: Payer: Self-pay

## 2020-09-10 ENCOUNTER — Encounter (HOSPITAL_COMMUNITY): Payer: Self-pay

## 2020-09-10 ENCOUNTER — Emergency Department (HOSPITAL_COMMUNITY): Payer: Medicare HMO

## 2020-09-10 ENCOUNTER — Inpatient Hospital Stay (HOSPITAL_COMMUNITY)
Admission: EM | Admit: 2020-09-10 | Discharge: 2020-09-14 | DRG: 394 | Disposition: A | Discharge status: Skilled Nursing Facility | Payer: Medicare HMO | Source: Skilled Nursing Facility | Attending: Internal Medicine | Admitting: Internal Medicine

## 2020-09-10 DIAGNOSIS — E1169 Type 2 diabetes mellitus with other specified complication: Secondary | ICD-10-CM | POA: Diagnosis present

## 2020-09-10 DIAGNOSIS — D631 Anemia in chronic kidney disease: Secondary | ICD-10-CM | POA: Diagnosis present

## 2020-09-10 DIAGNOSIS — K219 Gastro-esophageal reflux disease without esophagitis: Secondary | ICD-10-CM | POA: Diagnosis present

## 2020-09-10 DIAGNOSIS — E876 Hypokalemia: Secondary | ICD-10-CM | POA: Diagnosis not present

## 2020-09-10 DIAGNOSIS — Z781 Physical restraint status: Secondary | ICD-10-CM

## 2020-09-10 DIAGNOSIS — R1084 Generalized abdominal pain: Secondary | ICD-10-CM | POA: Insufficient documentation

## 2020-09-10 DIAGNOSIS — Z923 Personal history of irradiation: Secondary | ICD-10-CM | POA: Diagnosis not present

## 2020-09-10 DIAGNOSIS — E1122 Type 2 diabetes mellitus with diabetic chronic kidney disease: Secondary | ICD-10-CM | POA: Diagnosis present

## 2020-09-10 DIAGNOSIS — E782 Mixed hyperlipidemia: Secondary | ICD-10-CM | POA: Diagnosis not present

## 2020-09-10 DIAGNOSIS — R569 Unspecified convulsions: Secondary | ICD-10-CM | POA: Diagnosis present

## 2020-09-10 DIAGNOSIS — Z807 Family history of other malignant neoplasms of lymphoid, hematopoietic and related tissues: Secondary | ICD-10-CM

## 2020-09-10 DIAGNOSIS — K900 Celiac disease: Secondary | ICD-10-CM | POA: Diagnosis present

## 2020-09-10 DIAGNOSIS — J449 Chronic obstructive pulmonary disease, unspecified: Secondary | ICD-10-CM | POA: Diagnosis present

## 2020-09-10 DIAGNOSIS — C4A4 Merkel cell carcinoma of scalp and neck: Secondary | ICD-10-CM | POA: Diagnosis present

## 2020-09-10 DIAGNOSIS — N1831 Chronic kidney disease, stage 3a: Secondary | ICD-10-CM | POA: Diagnosis present

## 2020-09-10 DIAGNOSIS — I5032 Chronic diastolic (congestive) heart failure: Secondary | ICD-10-CM | POA: Diagnosis present

## 2020-09-10 DIAGNOSIS — G40909 Epilepsy, unspecified, not intractable, without status epilepticus: Secondary | ICD-10-CM

## 2020-09-10 DIAGNOSIS — K559 Vascular disorder of intestine, unspecified: Secondary | ICD-10-CM | POA: Diagnosis present

## 2020-09-10 DIAGNOSIS — I130 Hypertensive heart and chronic kidney disease with heart failure and stage 1 through stage 4 chronic kidney disease, or unspecified chronic kidney disease: Secondary | ICD-10-CM | POA: Diagnosis present

## 2020-09-10 DIAGNOSIS — Z66 Do not resuscitate: Secondary | ICD-10-CM | POA: Diagnosis present

## 2020-09-10 DIAGNOSIS — K551 Chronic vascular disorders of intestine: Principal | ICD-10-CM | POA: Diagnosis present

## 2020-09-10 DIAGNOSIS — F17210 Nicotine dependence, cigarettes, uncomplicated: Secondary | ICD-10-CM | POA: Diagnosis present

## 2020-09-10 DIAGNOSIS — Z20822 Contact with and (suspected) exposure to covid-19: Secondary | ICD-10-CM | POA: Diagnosis present

## 2020-09-10 DIAGNOSIS — Z79899 Other long term (current) drug therapy: Secondary | ICD-10-CM

## 2020-09-10 DIAGNOSIS — Z950 Presence of cardiac pacemaker: Secondary | ICD-10-CM

## 2020-09-10 DIAGNOSIS — F0390 Unspecified dementia without behavioral disturbance: Secondary | ICD-10-CM | POA: Diagnosis present

## 2020-09-10 DIAGNOSIS — Z85818 Personal history of malignant neoplasm of other sites of lip, oral cavity, and pharynx: Secondary | ICD-10-CM

## 2020-09-10 DIAGNOSIS — R197 Diarrhea, unspecified: Secondary | ICD-10-CM | POA: Insufficient documentation

## 2020-09-10 LAB — COMPREHENSIVE METABOLIC PANEL
ALT: 15 U/L (ref 0–44)
AST: 22 U/L (ref 15–41)
Albumin: 3 g/dL — ABNORMAL LOW (ref 3.5–5.0)
Alkaline Phosphatase: 85 U/L (ref 38–126)
Anion gap: 9 (ref 5–15)
BUN: 26 mg/dL — ABNORMAL HIGH (ref 8–23)
CO2: 28 mmol/L (ref 22–32)
Calcium: 8.7 mg/dL — ABNORMAL LOW (ref 8.9–10.3)
Chloride: 100 mmol/L (ref 98–111)
Creatinine, Ser: 1.27 mg/dL — ABNORMAL HIGH (ref 0.61–1.24)
GFR, Estimated: 59 mL/min — ABNORMAL LOW (ref >60)
Glucose, Bld: 156 mg/dL — ABNORMAL HIGH (ref 70–99)
Potassium: 3.9 mmol/L (ref 3.5–5.1)
Sodium: 137 mmol/L (ref 135–145)
Total Bilirubin: 0.6 mg/dL (ref 0.3–1.2)
Total Protein: 5.9 g/dL — ABNORMAL LOW (ref 6.5–8.1)

## 2020-09-10 LAB — RESPIRATORY PANEL BY RT PCR (FLU A&B, COVID)
Influenza A by PCR: NEGATIVE
Influenza B by PCR: NEGATIVE
SARS Coronavirus 2 by RT PCR: NEGATIVE

## 2020-09-10 LAB — CBC WITH DIFFERENTIAL/PLATELET
Abs Immature Granulocytes: 0.3 10*3/uL — ABNORMAL HIGH (ref 0.00–0.07)
Basophils Absolute: 0.1 10*3/uL (ref 0.0–0.1)
Basophils Relative: 0 %
Eosinophils Absolute: 0.1 10*3/uL (ref 0.0–0.5)
Eosinophils Relative: 0 %
HCT: 33 % — ABNORMAL LOW (ref 39.0–52.0)
Hemoglobin: 10.8 g/dL — ABNORMAL LOW (ref 13.0–17.0)
Immature Granulocytes: 1 %
Lymphocytes Relative: 2 %
Lymphs Abs: 0.5 10*3/uL — ABNORMAL LOW (ref 0.7–4.0)
MCH: 31.3 pg (ref 26.0–34.0)
MCHC: 32.7 g/dL (ref 30.0–36.0)
MCV: 95.7 fL (ref 80.0–100.0)
Monocytes Absolute: 1 10*3/uL (ref 0.1–1.0)
Monocytes Relative: 3 %
Neutro Abs: 34.2 10*3/uL — ABNORMAL HIGH (ref 1.7–7.7)
Neutrophils Relative %: 94 %
Platelets: 336 10*3/uL (ref 150–400)
RBC: 3.45 MIL/uL — ABNORMAL LOW (ref 4.22–5.81)
RDW: 16.4 % — ABNORMAL HIGH (ref 11.5–15.5)
WBC: 36.3 10*3/uL — ABNORMAL HIGH (ref 4.0–10.5)
nRBC: 0 % (ref 0.0–0.2)

## 2020-09-10 LAB — TROPONIN I (HIGH SENSITIVITY)
Troponin I (High Sensitivity): 19 ng/L — ABNORMAL HIGH (ref <18)
Troponin I (High Sensitivity): 19 ng/L — ABNORMAL HIGH (ref <18)

## 2020-09-10 LAB — LACTIC ACID, PLASMA: Lactic Acid, Venous: 1.4 mmol/L (ref 0.5–1.9)

## 2020-09-10 LAB — LIPASE, BLOOD: Lipase: 17 U/L (ref 11–51)

## 2020-09-10 MED ORDER — ONDANSETRON HCL 4 MG PO TABS: 4.0000 mg | ORAL_TABLET | Freq: Four times a day (QID) | ORAL | Status: DC | PRN

## 2020-09-10 MED ORDER — TAMSULOSIN HCL 0.4 MG PO CAPS
0.4000 mg | ORAL_CAPSULE | Freq: Every day | ORAL | Status: DC
  Administered 2020-09-10 – 2020-09-14 (×5): 0.4 mg via ORAL
  Filled 2020-09-10 (×5): qty 1

## 2020-09-10 MED ORDER — SODIUM CHLORIDE 0.9 % IV BOLUS: 500.0000 mL | Freq: Once | INTRAVENOUS | Status: DC

## 2020-09-10 MED ORDER — LEVETIRACETAM 500 MG PO TABS
750.0000 mg | ORAL_TABLET | Freq: Two times a day (BID) | ORAL | Status: DC
  Administered 2020-09-10 – 2020-09-14 (×9): 750 mg via ORAL
  Filled 2020-09-10 (×9): qty 1

## 2020-09-10 MED ORDER — ENOXAPARIN SODIUM 40 MG/0.4ML ~~LOC~~ SOLN
40.0000 mg | SUBCUTANEOUS | Status: DC
  Administered 2020-09-10 – 2020-09-14 (×3): 40 mg via SUBCUTANEOUS
  Filled 2020-09-10 (×5): qty 0.4

## 2020-09-10 MED ORDER — LORAZEPAM 0.5 MG PO TABS
0.5000 mg | ORAL_TABLET | Freq: Three times a day (TID) | ORAL | Status: DC | PRN
  Administered 2020-09-11: 0.5 mg via ORAL
  Filled 2020-09-10 (×2): qty 1

## 2020-09-10 MED ORDER — SODIUM CHLORIDE 0.9 % IV SOLN: INTRAVENOUS | Status: DC

## 2020-09-10 MED ORDER — VITAMIN B-12 100 MCG PO TABS
500.0000 ug | ORAL_TABLET | Freq: Every day | ORAL | Status: DC
  Administered 2020-09-10 – 2020-09-14 (×5): 500 ug via ORAL
  Filled 2020-09-10 (×2): qty 5
  Filled 2020-09-10: qty 1
  Filled 2020-09-10 (×2): qty 5

## 2020-09-10 MED ORDER — LACTATED RINGERS IV BOLUS
1000.0000 mL | Freq: Once | INTRAVENOUS | Status: AC
  Administered 2020-09-10: 1000 mL via INTRAVENOUS

## 2020-09-10 MED ORDER — IOHEXOL 300 MG/ML  SOLN
100.0000 mL | Freq: Once | INTRAMUSCULAR | Status: AC | PRN
  Administered 2020-09-10: 100 mL via INTRAVENOUS

## 2020-09-10 MED ORDER — ACETAMINOPHEN 325 MG PO TABS
650.0000 mg | ORAL_TABLET | Freq: Four times a day (QID) | ORAL | Status: DC | PRN
  Administered 2020-09-12: 650 mg via ORAL
  Filled 2020-09-10 (×2): qty 2

## 2020-09-10 MED ORDER — ATORVASTATIN CALCIUM 20 MG PO TABS
20.0000 mg | ORAL_TABLET | Freq: Every day | ORAL | Status: DC
  Administered 2020-09-10 – 2020-09-14 (×5): 20 mg via ORAL
  Filled 2020-09-10 (×2): qty 1
  Filled 2020-09-10: qty 2
  Filled 2020-09-10 (×2): qty 1

## 2020-09-10 MED ORDER — PIPERACILLIN-TAZOBACTAM 3.375 G IVPB
3.3750 g | Freq: Three times a day (TID) | INTRAVENOUS | Status: DC
  Administered 2020-09-10 – 2020-09-13 (×10): 3.375 g via INTRAVENOUS
  Filled 2020-09-10 (×10): qty 50

## 2020-09-10 MED ORDER — PIPERACILLIN-TAZOBACTAM 3.375 G IVPB 30 MIN
3.3750 g | Freq: Once | INTRAVENOUS | Status: AC
  Administered 2020-09-10: 3.375 g via INTRAVENOUS
  Filled 2020-09-10: qty 50

## 2020-09-10 MED ORDER — ACETAMINOPHEN 650 MG RE SUPP: 650.0000 mg | Freq: Four times a day (QID) | RECTAL | Status: DC | PRN

## 2020-09-10 MED ORDER — ONDANSETRON HCL 4 MG/2ML IJ SOLN: 4.0000 mg | Freq: Four times a day (QID) | INTRAMUSCULAR | Status: DC | PRN

## 2020-09-10 MED ORDER — ALLOPURINOL 300 MG PO TABS
300.0000 mg | ORAL_TABLET | Freq: Every day | ORAL | Status: DC
  Administered 2020-09-10 – 2020-09-14 (×5): 300 mg via ORAL
  Filled 2020-09-10 (×8): qty 1

## 2020-09-10 NOTE — Consult Note (Signed)
Referring Provider: [facility] Primary Care Physician:  Lesia Hausen, PA Primary Gastroenterologist:  Dr. Paulita Fujita (2011)  Date of Admission: 09/10/20 Date of Consultation: 09/10/20  Reason for Consultation:  Ischemic colitis  HPI:  Francisco [name] is a 74 y.o. male with a past medical history of anemia, celiac disease, CHF, COPD, GERD, status post radiation therapy to the parotid, multiple duodenal ulcers, permanent cardiac pacemaker, seizures.  He presented to the ED early this morning from the nursing home by ambulance for diarrhea and diffuse abdominal pain without nausea or vomiting, fever.  White blood cell count elevated at 36.3, hemoglobin a bit low at 10.8 (baseline 10-11).  Rectal exam found formed stool in the rectal vault, query overflow diarrhea.  Hemoccult negative stool.  Baseline confusion.  CT completed this morning of the abdomen and pelvis found long segment of colonic mural thickening extending from the splenic flexure to the rectosigmoid junction within the IMA vascular distribution.  Noted extensive surrounding pericolonic inflammatory changes particularly in the distal sigmoid with edematous features of the mesentery and small volume free fluid in the adjacent leaflets.  Apparent severely narrowed SMA origin and suspected ostial occlusion of the IMA and CT findings suspicious for ischemic colitis.  Noted rectal stool ball with mild rectal wall thickening likely reflecting impaction or mild sterile coral colitis.  Other findings as outlined below.  Surgery was consulted, noted limited history due to dementia.  Surgery felt likely ischemic colitis without acute surgical abdomen, deemed not a ideal surgical candidate.  Recommended medical therapy including IV antibiotics and fluids.  Nursing notes indicate patient is persistently agitated, pulling at lines and is subsequently pulled out three IVs.  Some combativeness with staff including attempted hitting and kicking.  The  patient was started on IV Zosyn.  Attempts to discuss with family (patient's sister who is listed as point of contact) and have not been able to reach her.  Today he remains confused and combative.  Has been plan multiple lines.  Not answering questions appropriately.  Past Medical History:  Diagnosis Date  . Anemia   . Anginal pain ([place])    normal coronaries 2016  . Celiac disease   . CHF (congestive heart failure) ([place])   . COPD (chronic obstructive pulmonary disease) ([place])   . GERD (gastroesophageal reflux disease)   . Heart murmur   . History of radiation therapy 10/07/18- 11/04/18   Parotid, left 50 Gy in 20 fractions of 2.5 Gy.   Marland Kitchen Hypertension   . Multiple duodenal ulcers   . parotid gland cancer Left   . Presence of permanent cardiac pacemaker   . Right internal carotid occlusion   . Seizures ([place])   . Shortness of breath   . Symptomatic Bradycardia    a. 07/2015 s/p MDT Advisa L DC PPM (Ser #: [id number]).  . 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 APPLICATION OF CRANIAL NAVIGATION Right 08/04/2020   Procedure: APPLICATION OF CRANIAL NAVIGATION;  Surgeon: Vallarie Mare, MD;  Location: [place];  Service: Neurosurgery;  Laterality: Right;  . CARDIAC CATHETERIZATION N/A 07/08/2015   Procedure: Left Heart Cath and Coronary Angiography;  Surgeon: Troy Sine, MD;  Location: [place] [facility];  Service: Cardiovascular;  Laterality: N/A;  . CARDIAC CATHETERIZATION N/A 07/08/2015   Procedure: Temporary Pacemaker;  Surgeon: Troy Sine, MD;  Location: [place] [facility];  Service: Cardiovascular;  Laterality: N/A;  . CARDIAC  CATHETERIZATION N/A 07/09/2015   Procedure: Temporary Pacemaker;  Surgeon: Troy Sine, MD;  Location: [place] [facility];  Service: Cardiovascular;  Laterality: N/A;  . CRANIOTOMY Right 08/04/2020   Procedure: RIGHT PARIETAL OCCIPITAL CRANIOTOMY TUMOR EXCISION;  Surgeon:  Vallarie Mare, MD;  Location: [place];  Service: Neurosurgery;  Laterality: Right;  . EP IMPLANTABLE DEVICE N/A 07/09/2015   Procedure: Pacemaker Implant;  Surgeon: Will Meredith Leeds, MD;  Location: [place] [facility];  Service: Cardiovascular;  Laterality: N/A;  . ESOPHAGOGASTRODUODENOSCOPY  07/03/2012   Procedure: ESOPHAGOGASTRODUODENOSCOPY (EGD);  Surgeon: Winfield Cunas., MD;  Location: [facility];  Service: Endoscopy;  Laterality: N/A;  . HEMORRHOID SURGERY    . HERNIA REPAIR    . MOLE REMOVAL    . PAROTIDECTOMY Left 08/21/2018   superficial  . PAROTIDECTOMY Left 08/21/2018   Procedure: LEFT SUPERFICIAL PAROTIDECTOMY;  Surgeon: Helayne Seminole, MD;  Location: [place];  Service: ENT;  Laterality: Left;    Prior to Admission medications   Medication Sig Start Date End Date Taking? Authorizing Provider  allopurinol (ZYLOPRIM) 300 MG tablet TAKE 1 TABLET(300 MG) BY MOUTH DAILY Patient taking differently: Take 300 mg by mouth daily.  05/30/18  Yes Dena Billet B, PA-C  atorvastatin (LIPITOR) 20 MG tablet Take 1 tablet (20 mg total) by mouth daily. 11/23/17  Yes Saltillo, Modena Nunnery, MD  colchicine 0.6 MG tablet Take 1 tablet (0.6 mg total) by mouth 2 (two) times daily as needed (gout flare). Take twice daily for 2 weeks, then use as needed for gout flare up 12/04/17  Yes Eugenie Filler, MD  guaiFENesin (MUCINEX) 600 MG 12 hr tablet Take 600-1,200 mg by mouth every 12 (twelve) hours as needed for cough.    Yes [provider]  guaiFENesin (ROBITUSSIN) 100 MG/5ML liquid Take 200 mg by mouth every 6 (six) hours as needed for cough.    Yes [provider]  levETIRAcetam (KEPPRA) 750 MG tablet Take 1 tablet (750 mg total) by mouth 2 (two) times daily. 08/14/20  Yes Georgette Shell, MD  lidocaine (XYLOCAINE) 2 % solution Patient: Mix 1part 2% viscous lidocaine, 1part H20. Swish & swallow 40m of diluted mixture, 230m before meals and at bedtime, up to QID 10/21/18  Yes  SqEppie GibsonMD  loperamide (IMODIUM A-D) 2 MG tablet Take 2 mg by mouth as needed for diarrhea or loose stools.    Yes [provider]  LORazepam (ATIVAN) 0.5 MG tablet Take 0.5 mg by mouth every 8 (eight) hours as needed for agitation or anxiety. 09/06/20  Yes [provider]  magnesium hydroxide (MILK OF MAGNESIA) 400 MG/5ML suspension Take 30 mLs by mouth at bedtime as needed for mild constipation.   Yes [provider]  naproxen sodium (ALEVE) 220 MG tablet Take 220 mg by mouth every 12 (twelve) hours as needed (joint pain).   Yes [provider]  omeprazole (PRILOSEC) 20 MG capsule Take 20 mg by mouth daily.   Yes [provider]  ondansetron (ZOFRAN) 4 MG tablet Take 1 tablet (4 mg total) by mouth every 4 (four) hours as needed for nausea or vomiting. 08/14/20  Yes MaGeorgette ShellMD  tamsulosin (FLOMAX) 0.4 MG CAPS capsule TAKE 1 CAPSULE(0.4 MG) BY MOUTH DAILY Patient taking differently: Take 0.4 mg by mouth daily.  05/30/18  Yes DiOrlena SheldonPA-C  vitamin B-12 (CYANOCOBALAMIN) 500 MCG tablet Take 500 mcg by mouth daily.    Yes [provider]  clotrimazole-betamethasone (LOTRISONE) cream APPLY EXTERNALLY TO THE AFFECTED AREA TWICE DAILY Patient not taking: No sig reported 03/22/17   Alycia Rossetti, MD  dexamethasone (DECADRON) 1 MG tablet Take 1 tablet (1 mg total) by mouth daily. Patient not taking: Reported on 08/18/2020 08/15/20   Georgette Shell, MD  midodrine (PROAMATINE) 10 MG tablet Take 0.5 tablets (5 mg total) by mouth 3 (three) times daily with meals. Patient not taking: Reported on 08/18/2020 08/16/20   Georgette Shell, MD    Current Facility-Administered Medications  Medication Dose Route Frequency Provider Last Rate Last Admin  . 0.9 %  sodium chloride infusion   Intravenous Continuous Tat, David, MD 100 mL/hr at 09/10/20 1126 New Bag at 09/10/20 1126  . acetaminophen (TYLENOL) tablet 650 mg  650 mg  Oral Q6H PRN Tat, Shanon Brow, MD       Or  . acetaminophen (TYLENOL) suppository 650 mg  650 mg Rectal Q6H PRN Tat, Shanon Brow, MD      . allopurinol (ZYLOPRIM) tablet 300 mg  300 mg Oral Daily Tat, David, MD      . atorvastatin (LIPITOR) tablet 20 mg  20 mg Oral Daily Tat, David, MD      . enoxaparin (LOVENOX) injection 40 mg  40 mg Subcutaneous Q24H Tat, David, MD      . levETIRAcetam (KEPPRA) tablet 750 mg  750 mg Oral BID Tat, David, MD      . LORazepam (ATIVAN) tablet 0.5 mg  0.5 mg Oral Q8H PRN Tat, Shanon Brow, MD      . ondansetron (ZOFRAN) tablet 4 mg  4 mg Oral Q6H PRN Tat, David, MD       Or  . ondansetron (ZOFRAN) injection 4 mg  4 mg Intravenous Q6H PRN Tat, David, MD      . piperacillin-tazobactam (ZOSYN) IVPB 3.375 g  3.375 g Intravenous Q8H Tat, David, MD      . tamsulosin (FLOMAX) capsule 0.4 mg  0.4 mg Oral Daily Tat, David, MD      . vitamin B-12 (CYANOCOBALAMIN) tablet 500 mcg  500 mcg Oral Daily Tat, David, MD       Current Outpatient Medications  Medication Sig Dispense Refill  . allopurinol (ZYLOPRIM) 300 MG tablet TAKE 1 TABLET(300 MG) BY MOUTH DAILY (Patient taking differently: Take 300 mg by mouth daily. ) 90 tablet 0  . atorvastatin (LIPITOR) 20 MG tablet Take 1 tablet (20 mg total) by mouth daily. 90 tablet 3  . colchicine 0.6 MG tablet Take 1 tablet (0.6 mg total) by mouth 2 (two) times daily as needed (gout flare). Take twice daily for 2 weeks, then use as needed for gout flare up 60 tablet 0  . guaiFENesin (MUCINEX) 600 MG 12 hr tablet Take 600-1,200 mg by mouth every 12 (twelve) hours as needed for cough.     Marland Kitchen guaiFENesin (ROBITUSSIN) 100 MG/5ML liquid Take 200 mg by mouth every 6 (six) hours as needed for cough.     . levETIRAcetam (KEPPRA) 750 MG tablet Take 1 tablet (750 mg total) by mouth 2 (two) times daily. 60 tablet 2  . lidocaine (XYLOCAINE) 2 % solution Patient: Mix 1part 2% viscous lidocaine, 1part H20. Swish & swallow 19m of diluted mixture, 249m before meals and  at bedtime, up to QID 200 mL 3  . loperamide (IMODIUM A-D) 2 MG tablet Take 2 mg by mouth as needed for diarrhea or loose stools.     . Marland KitchenORazepam (ATIVAN) 0.5 MG tablet Take  0.5 mg by mouth every 8 (eight) hours as needed for agitation or anxiety.    . magnesium hydroxide (MILK OF MAGNESIA) 400 MG/5ML suspension Take 30 mLs by mouth at bedtime as needed for mild constipation.    . naproxen sodium (ALEVE) 220 MG tablet Take 220 mg by mouth every 12 (twelve) hours as needed (joint pain).    Marland Kitchen omeprazole (PRILOSEC) 20 MG capsule Take 20 mg by mouth daily.    . ondansetron (ZOFRAN) 4 MG tablet Take 1 tablet (4 mg total) by mouth every 4 (four) hours as needed for nausea or vomiting. 20 tablet 0  . tamsulosin (FLOMAX) 0.4 MG CAPS capsule TAKE 1 CAPSULE(0.4 MG) BY MOUTH DAILY (Patient taking differently: Take 0.4 mg by mouth daily. ) 90 capsule 0  . vitamin B-12 (CYANOCOBALAMIN) 500 MCG tablet Take 500 mcg by mouth daily.     . clotrimazole-betamethasone (LOTRISONE) cream APPLY EXTERNALLY TO THE AFFECTED AREA TWICE DAILY (Patient not taking: No sig reported) 45 g 0  . dexamethasone (DECADRON) 1 MG tablet Take 1 tablet (1 mg total) by mouth daily. (Patient not taking: Reported on 08/18/2020) 4 tablet 0  . midodrine (PROAMATINE) 10 MG tablet Take 0.5 tablets (5 mg total) by mouth 3 (three) times daily with meals. (Patient not taking: Reported on 08/18/2020) 60 tablet 0    Allergies as of 09/10/2020  . (No Known Allergies)    Family History  Problem Relation Age of Onset  . Hodgkin's lymphoma Father     Social History   Socioeconomic History  . Marital status: Single    Spouse name: Not on file  . Number of children: Not on file  . Years of education: Not on file  . Highest education level: Not on file  Occupational History  . Not on file  Tobacco Use  . Smoking status: Current Every Day Smoker    Packs/day: 0.25    Years: 50.00    Pack years: 12.50    Types: Cigarettes  . Smokeless  tobacco: Current User  . Tobacco comment: 3-4 cigarettes daily- 09/19/19  Vaping Use  . Vaping Use: Former  Substance and Sexual Activity  . Alcohol use: Not Currently  . Drug use: No  . Sexual activity: Never  Other Topics Concern  . Not on file  Social History Narrative   Lives at [facility]   Social Determinants of Health   Financial Resource Strain:   . Difficulty of Paying Living Expenses: Not on file  Food Insecurity:   . Worried About Charity fundraiser in the Last Year: Not on file  . Ran Out of Food in the Last Year: Not on file  Transportation Needs:   . Lack of Transportation (Medical): Not on file  . Lack of Transportation (Non-Medical): Not on file  Physical Activity:   . Days of Exercise per Week: Not on file  . Minutes of Exercise per Session: Not on file  Stress:   . Feeling of Stress : Not on file  Social Connections:   . Frequency of Communication with Friends and Family: Not on file  . Frequency of Social Gatherings with Friends and Family: Not on file  . Attends Religious Services: Not on file  . Active Member of Clubs or Organizations: Not on file  . Attends Archivist Meetings: Not on file  . Marital Status: Not on file  Intimate Partner Violence:   . Fear of Current or Ex-Partner: Not on file  .  Emotionally Abused: Not on file  . Physically Abused: Not on file  . Sexually Abused: Not on file    Review of Systems: Limited due to altered mental status  Physical Exam: Vital signs in last 24 hours: Temp:  [98.9 F (37.2 C)] 98.9 F (37.2 C) (11/05 0026) Pulse Rate:  [71-85] 71 (11/05 1100) Resp:  [13-22] 16 (11/05 1100) BP: (93-123)/(49-99) 116/68 (11/05 1100) SpO2:  [97 %-100 %] 100 % (11/05 1100) Weight:  [74.8 kg] 74.8 kg (11/05 0027)   General:   Well-developed, well-nourished Head:  Normocephalic and atraumatic. Eyes:  Sclera clear, no icterus. Conjunctiva pink. Neck:  Supple; no masses or thyromegaly. Lungs:   Clear throughout to auscultation. No wheezes, crackles, or rhonchi. No acute distress. Heart:  Regular rate and rhythm; no murmurs, clicks, rubs, or gallops. Abdomen:  Soft, nontender and nondistended. No masses, hepatosplenomegaly or hernias noted. Normal bowel sounds, without guarding, and without rebound.   Rectal:  Deferred until time of colonoscopy.   Msk:  Symmetrical without gross deformities. Pulses:  Normal bilateral DP pulses noted. Extremities:  Without clubbing or edema.  Intake/Output from previous day: No intake/output data recorded. Intake/Output this shift: No intake/output data recorded.  Lab Results: Recent Labs    09/10/20 0124  WBC 36.3*  HGB 10.8*  HCT 33.0*  PLT 336   BMET Recent Labs    09/10/20 0124  NA 137  K 3.9  CL 100  CO2 28  GLUCOSE 156*  BUN 26*  CREATININE 1.27*  CALCIUM 8.7*   LFT Recent Labs    09/10/20 0124  PROT 5.9*  ALBUMIN 3.0*  AST 22  ALT 15  ALKPHOS 85  BILITOT 0.6   PT/INR No results for input(s): LABPROT, INR in the last 72 hours. Hepatitis Panel No results for input(s): HEPBSAG, HCVAB, HEPAIGM, HEPBIGM in the last 72 hours. C-Diff No results for input(s): CDIFFTOX in the last 72 hours.  Studies/Results: CT ABDOMEN PELVIS W CONTRAST  Result Date: 09/10/2020 CLINICAL DATA:  Severe abdominal pain and diarrhea EXAM: CT ABDOMEN AND PELVIS WITH CONTRAST TECHNIQUE: Multidetector CT imaging of the abdomen and pelvis was performed using the standard protocol following bolus administration of intravenous contrast. CONTRAST:  12m OMNIPAQUE IOHEXOL 300 MG/ML  SOLN COMPARISON:  CT 08/03/2020 FINDINGS: Lower chest: Some dependent atelectatic changes in the most clear lung bases. Cardiac size within normal limits. No pericardial effusion. Pacer leads terminate at the cardiac apex and right atrium. Extensive coronary artery atherosclerosis. Dense calcification of mitral annulus and aortic leaflets as well. Distal thoracic aortic  atherosclerosis. Hepatobiliary: No worrisome focal liver lesions. Smooth liver surface contour. Normal hepatic attenuation. Gallbladder is within normal limits for physiologic distension. No visible calcified gallstone or biliary ductal dilatation. Pancreas: No pancreatic ductal dilatation or surrounding inflammatory changes. Spleen: Normal in size. No concerning splenic lesions. Some mild stranding along the posterior spleen is likely redistributed from adjacent colonic process. Adrenals/Urinary Tract: Normal adrenal glands. Kidneys are normally located with symmetric enhancementand excretion. Stable areas cortical lobulation, possibly scarring. No suspicious renal lesion, urolithiasis or hydronephrosis. Circumferential bladder wall thickening, may be partly reactive. Stomach/Bowel: Distal esophagus, stomach and duodenum are unremarkable. No small bowel thickening or dilatation. Much of the proximal colon is fluid-filled. There is some heterogeneous circumferential mural thickening in the ascending colon (5/49) possibly related to normal peristaltic contraction though should be further evaluated on an outpatient basis. More concerning is a long segment of colonic mural thickening extending from the splenic flexure to the  rectosigmoid junction which is within the IMA vascular distribution. Extensive surrounding pericolonic inflammatory changes are present particularly about the distal sigmoid with edematous features of the mesentery and small volume free fluid in the adjacent leaflets. A larger rectal stool ball is noted as well with at most minimal mural thickening at the level of the rectal vault. Vascular/Lymphatic: Extensive atherosclerotic calcification throughout the abdominal aorta and branch vessels. This includes high-grade ostial narrowing and likely occlusion of the IMA origin with some residual opacification of the proximal IMA supplied via back fill and colic collaterals as well as additional high-grade  narrowing and a hypodense filling defect seen within the proximal SMA origin which given concomitant occlusion of the IMA origin elsewhere could feasibly result in vascular compromise of the distal colon. No aneurysm or ectasia. No major venous abnormalities. Some reactive adenopathy in the mesentery. No pathologically enlarged nodes in the abdomen or pelvis. Reproductive: Borderline prostatomegaly.  No focal prostate lesions. Other: Inflammatory changes centered upon the thickened distal colon and more focally in the low pelvis upon the sigmoid. Some mild pre sacral fat stranding as well possibly related to the rectal stool ball/impaction. Some perivesicular hazy stranding as well. No abdominopelvic free air. No organized collection or abscess. No bowel containing hernias. Musculoskeletal: The osseous structures appear diffusely demineralized which may limit detection of small or nondisplaced fractures. No acute osseous abnormality or suspicious osseous lesion. Multilevel degenerative changes are present in the imaged portions of the spine. Features most pronounced L4-5. Additional degenerative changes in the hips and pelvis. IMPRESSION: 1. Long segment of colonic mural thickening extending from the splenic flexure to the rectosigmoid junction which is within the IMA vascular distribution. Extensive surrounding pericolonic inflammatory changes are present particularly about the distal sigmoid with edematous features of the mesentery and small volume free fluid in the adjacent leaflets. Given a hypodense and possibly acute occlusive filling defect in the severely narrowed SMA origin and suspected ostial occlusion of the IMA, appearance is is worrisome for an acute ischemic colitis. Infectious or inflammatory colitis is possible though less favored. 2. Rectal stool ball with some additional mild rectal wall thickening could reflect impaction or mild stercoral colitis as well. 3. Nonspecific thickening seen in the  distal ascending colon. Possibly peristaltic though should consider correlation with outpatient colonoscopy to exclude underlying lesion. 4. Circumferential bladder wall thickening, possibly related to outlet obstruction with borderline prostatomegaly though could correlate with urinary symptoms and urinalysis to exclude superimposed cystitis. 5. Aortic Atherosclerosis (ICD10-I70.0). Critical Value/emergent results were called by telephone at the time of interpretation on 09/10/2020 at 3:21 am to provider [facility] , who verbally acknowledged these results. Electronically Signed   By: Lovena Le M.D.   On: 09/10/2020 03:22   DG Chest Port 1 View  Result Date: 09/10/2020 CLINICAL DATA:  Abdominal pain EXAM: PORTABLE CHEST 1 VIEW COMPARISON:  04/04/2019 FINDINGS: Left pacer remains in place, unchanged. Heart and mediastinal contours are within normal limits. No focal opacities or effusions. No acute bony abnormality. Aortic atherosclerosis. IMPRESSION: No active cardiopulmonary disease. Electronically Signed   By: Rolm Baptise M.D.   On: 09/10/2020 02:57    Impression: Confused 74 year old male with a history as outlined above who presents for diarrhea, diffuse abdominal pain without nausea or vomiting.  White blood cell count significantly elevated at 36.3, some anemia at 10.8 (essentially baseline).  He has been confused, combative, has pulled out multiple IVs.  He is currently in soft restraints.  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  Associated extensive inflammatory changes in the distal sigmoid and edematous features of the mesentery consistent with ischemic presentation.  Also noted rectal stool ball and mild rectal wall thickening possible stercoral colitis.  Surgical consult deemed not a good surgical candidate.  There is potential for IR intervention.  There is a question of  possible flexible sigmoidoscopy, however the patient would not likely tolerate/allow DRE with disimpaction and/or enemas.  Interestingly, initially the patient did have some episodes of hypotension with systolic blood pressure 53-20.  If he had some hypotension back to his nursing home this could have triggered an acute presentation of ischemic colitis with the impacted vessel flow noted above.  Plan: 1. Continue IV antibiotics 2. Continue fluids with goal of MAP minimum 65 3. Supportive measures 4. Reevaluate mental status for any role of flexible sigmoidoscopy 5. Can consider IR intervention if patient does not improve and family desires aggressive measures   Thank you for allowing [place] to participate in the care of Avyan Livesay Hermenia Bers, DNP, AGNP-C Adult & Gerontological Nurse Practitioner [facility]   LOS: 0 days     09/10/2020, 11:34 AM

## 2020-09-10 NOTE — ED Notes (Signed)
IV no longer working. Pt agitated and combative with staff. Hitting and kicking.

## 2020-09-10 NOTE — ED Notes (Signed)
Pt pulled IV. New IV placed.

## 2020-09-10 NOTE — ED Provider Notes (Signed)
[facility] EMERGENCY DEPARTMENT Provider Note   CSN: [id number] Arrival date & time: 09/10/20  0023     History Chief Complaint  Patient presents with  . Abdominal Pain    diarrhea    Francisco [name] is a 74 y.o. male.  Patient presents to the emergency department from nursing home by ambulance.  Patient reportedly has been experiencing diarrhea today and has been experiencing diffuse abdominal pain.  No nausea or vomiting.  No fever.  Patient is not experiencing any chest pain or shortness of breath.        Past Medical History:  Diagnosis Date  . Anemia   . Anginal pain ([place])    normal coronaries 2016  . Celiac disease   . CHF (congestive heart failure) ([place])   . COPD (chronic obstructive pulmonary disease) ([place])   . GERD (gastroesophageal reflux disease)   . Heart murmur   . History of radiation therapy 10/07/18- 11/04/18   Parotid, left 50 Gy in 20 fractions of 2.5 Gy.   Marland Kitchen Hypertension   . Multiple duodenal ulcers   . parotid gland cancer Left   . Presence of permanent cardiac pacemaker   . Right internal carotid occlusion   . Seizures ([place])   . Shortness of breath   . Symptomatic Bradycardia    a. 07/2015 s/p MDT Advisa L DC PPM (Ser #: [id number]).  . 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 Acute metabolic encephalopathy   . Brain metastasis ([place]) 08/18/2020  . Neoplasm of brain causing mass effect on adjacent structures ([place]) 08/03/2020  . Seizure disorder ([place]) 08/03/2020  . Mixed hyperlipidemia due to type 2 diabetes mellitus ([place]) 08/03/2020  . Type 2 diabetes mellitus without complication, with long-term current use of insulin ([place]) 08/03/2020  . Bilateral carotid artery stenosis 08/03/2020  . Seizure with provoking factor ([place]) 08/02/2020  . 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 Parotid mass 08/21/2018  .  Carotid disease, bilateral ([place]) 03/20/2018  . Hyponatremia 12/02/2017  . Falls 12/01/2017  . History of alcohol abuse   . Chest pain 08/14/2016  . Seizure ([place])   . Gout 12/17/2015  . Convulsions ([place]) 08/17/2015  . Gait instability 08/17/2015  . Alcoholism /alcohol abuse 08/17/2015  . Cardiac device in situ, other   . Elevated troponin   . Cardiac arrest ([place])   . Complete heart block ([place])   . Seizures ([place])   . Chronic diastolic CHF (congestive heart failure) ([place])   . HLD (hyperlipidemia)   . Alcohol withdrawal ([place])   . Acute renal failure syndrome ([place])   . Hypokalemia   . Hypomagnesemia   . Absolute anemia   . Hypertrophic cardiomyopathy ([place])   . Diastolic CHF, acute on chronic (HCC)   . Essential hypertension   . Alcohol withdrawal seizure ([place]) 06/29/2015  . 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 Mild diastolic dysfunction 02/54/2706  . Acute pain of right knee 06/29/2015  . Transaminitis 06/29/2015  . Acute renal failure ([place]) 06/29/2015  . High anion gap metabolic acidosis 23/76/2831  . Acute hypokalemia 06/29/2015  . Alcohol abuse, daily use 06/10/2015  . Hyperglycemia 06/09/2015  . Vitamin D deficiency 06/09/2015  . Hyperlipidemia 06/09/2015  . Bilateral lower extremity edema (chronic) 06/09/2015  . BPH (benign prostatic hypertrophy) 08/13/2014  . Multiple duodenal ulcers 07/03/2012  . Chest pain  on exertion 07/02/2012  . GIB (gastrointestinal bleeding) 07/02/2012  . GERD (gastroesophageal reflux disease)   . Heart murmur   . Microcytic anemia 06/14/2012  . Generalized weakness 06/13/2012  . Celiac disease 06/13/2012  . HTN (hypertension) 06/13/2012  . Acute hyponatremia 06/13/2012    Past Surgical History:  Procedure Laterality Date  . APPLICATION OF CRANIAL NAVIGATION Right 08/04/2020   Procedure: APPLICATION OF CRANIAL NAVIGATION;  Surgeon: Vallarie Mare, MD;  Location: [place];  Service: Neurosurgery;  Laterality:  Right;  . CARDIAC CATHETERIZATION N/A 07/08/2015   Procedure: Left Heart Cath and Coronary Angiography;  Surgeon: Troy Sine, MD;  Location: [place] [facility];  Service: Cardiovascular;  Laterality: N/A;  . CARDIAC CATHETERIZATION N/A 07/08/2015   Procedure: Temporary Pacemaker;  Surgeon: Troy Sine, MD;  Location: [place] [facility];  Service: Cardiovascular;  Laterality: N/A;  . CARDIAC CATHETERIZATION N/A 07/09/2015   Procedure: Temporary Pacemaker;  Surgeon: Troy Sine, MD;  Location: [place] [facility];  Service: Cardiovascular;  Laterality: N/A;  . CRANIOTOMY Right 08/04/2020   Procedure: RIGHT PARIETAL OCCIPITAL CRANIOTOMY TUMOR EXCISION;  Surgeon: Vallarie Mare, MD;  Location: [place];  Service: Neurosurgery;  Laterality: Right;  . EP IMPLANTABLE DEVICE N/A 07/09/2015   Procedure: Pacemaker Implant;  Surgeon: Will Meredith Leeds, MD;  Location: [place] [facility];  Service: Cardiovascular;  Laterality: N/A;  . ESOPHAGOGASTRODUODENOSCOPY  07/03/2012   Procedure: ESOPHAGOGASTRODUODENOSCOPY (EGD);  Surgeon: Winfield Cunas., MD;  Location: [facility] ENDOSCOPY;  Service: Endoscopy;  Laterality: N/A;  . HEMORRHOID SURGERY    . HERNIA REPAIR    . MOLE REMOVAL    . PAROTIDECTOMY Left 08/21/2018   superficial  . PAROTIDECTOMY Left 08/21/2018   Procedure: LEFT SUPERFICIAL PAROTIDECTOMY;  Surgeon: Helayne Seminole, MD;  Location: [facility] OR;  Service: ENT;  Laterality: Left;       Family History  Problem Relation Age of Onset  . Hodgkin's lymphoma Father     Social History   Tobacco Use  . Smoking status: Current Every Day Smoker    Packs/day: 0.25    Years: 50.00    Pack years: 12.50    Types: Cigarettes  . Smokeless tobacco: Current User  . Tobacco comment: 3-4 cigarettes daily- 09/19/19  Vaping Use  . Vaping Use: Former  Substance Use Topics  . Alcohol use: Not Currently  . Drug use: No    Home Medications Prior to Admission medications   Medication Sig Start Date  End Date Taking? Authorizing Provider  allopurinol (ZYLOPRIM) 300 MG tablet TAKE 1 TABLET(300 MG) BY MOUTH DAILY Patient taking differently: Take 300 mg by mouth daily.  05/30/18   Orlena Sheldon, PA-C  atorvastatin (LIPITOR) 20 MG tablet Take 1 tablet (20 mg total) by mouth daily. 11/23/17   [place], Modena Nunnery, MD  clotrimazole-betamethasone (LOTRISONE) cream APPLY EXTERNALLY TO THE AFFECTED AREA TWICE DAILY Patient not taking: No sig reported 03/22/17   Alycia Rossetti, MD  colchicine 0.6 MG tablet Take 1 tablet (0.6 mg total) by mouth 2 (two) times daily as needed (gout flare). Take twice daily for 2 weeks, then use as needed for gout flare up 12/04/17   Eugenie Filler, MD  dexamethasone (DECADRON) 1 MG tablet Take 1 tablet (1 mg total) by mouth daily. Patient not taking: Reported on 08/18/2020 08/15/20   Georgette Shell, MD  guaiFENesin (MUCINEX) 600 MG 12 hr tablet Take 600-1,200 mg by mouth every 12 (twelve) hours as  needed for cough.     [provider]  guaiFENesin (ROBITUSSIN) 100 MG/5ML liquid Take 200 mg by mouth every 6 (six) hours as needed for cough.     [provider]  levETIRAcetam (KEPPRA) 750 MG tablet Take 1 tablet (750 mg total) by mouth 2 (two) times daily. 08/14/20   Georgette Shell, MD  lidocaine (XYLOCAINE) 2 % solution Patient: Mix 1part 2% viscous lidocaine, 1part H20. Swish & swallow 65m of diluted mixture, 275m before meals and at bedtime, up to QID Patient not taking: Reported on 04/04/2019 10/21/18   SqEppie GibsonMD  loperamide (IMODIUM A-D) 2 MG tablet Take 2 mg by mouth as needed for diarrhea or loose stools.     [provider]  magnesium hydroxide (MILK OF MAGNESIA) 400 MG/5ML suspension Take 30 mLs by mouth at bedtime as needed for mild constipation.    [provider]  midodrine (PROAMATINE) 10 MG tablet Take 0.5 tablets (5 mg total) by mouth 3 (three) times daily with meals. Patient not taking: Reported on  08/18/2020 08/16/20   MaGeorgette ShellMD  naproxen sodium (ALEVE) 220 MG tablet Take 220 mg by mouth every 12 (twelve) hours as needed (joint pain).    [provider]  neomycin-bacitracin-polymyxin (NEOSPORIN) ointment Apply 1 application topically as needed for wound care. Patient not taking: Reported on 08/18/2020    [provider]  omeprazole (PRILOSEC) 20 MG capsule Take 20 mg by mouth daily.    [provider]  ondansetron (ZOFRAN) 4 MG tablet Take 1 tablet (4 mg total) by mouth every 4 (four) hours as needed for nausea or vomiting. 08/14/20   MaGeorgette ShellMD  tamsulosin (FLOMAX) 0.4 MG CAPS capsule TAKE 1 CAPSULE(0.4 MG) BY MOUTH DAILY Patient taking differently: Take 0.4 mg by mouth daily.  05/30/18   DiOrlena SheldonPA-C  vitamin B-12 (CYANOCOBALAMIN) 500 MCG tablet Take 500 mcg by mouth daily. Patient not taking: Reported on 08/18/2020    [provider]    Allergies    Patient has no known allergies.  Review of Systems   Review of Systems  Gastrointestinal: Positive for abdominal pain and diarrhea.  All other systems reviewed and are negative.   Physical Exam Updated Vital Signs BP (!) 95/55   Pulse 71   Temp 98.9 F (37.2 C) (Oral)   Resp (!) 22   Ht 5' 10"  (1.778 m)   Wt 74.8 kg   SpO2 98%   BMI 23.68 kg/m   Physical Exam  ED Results / Procedures / Treatments   Labs (all labs ordered are listed, but only abnormal results are displayed) Labs Reviewed  CBC WITH DIFFERENTIAL/PLATELET - Abnormal; Notable for the following components:      Result Value   WBC 36.3 (*)    RBC 3.45 (*)    Hemoglobin 10.8 (*)    HCT 33.0 (*)    RDW 16.4 (*)    Neutro Abs 34.2 (*)    Lymphs Abs 0.5 (*)    Abs Immature Granulocytes 0.30 (*)    All other components within normal limits  COMPREHENSIVE METABOLIC PANEL - Abnormal; Notable for the following components:   Glucose, Bld 156 (*)    BUN 26 (*)    Creatinine, Ser 1.27 (*)      Calcium 8.7 (*)    Total Protein 5.9 (*)    Albumin 3.0 (*)    GFR, Estimated 59 (*)    All other components  within normal limits  TROPONIN I (HIGH SENSITIVITY) - Abnormal; Notable for the following components:   Troponin I (High Sensitivity) 19 (*)    All other components within normal limits  RESPIRATORY PANEL BY RT PCR (FLU A&B, COVID)  C DIFFICILE QUICK SCREEN W PCR REFLEX  GASTROINTESTINAL PANEL BY PCR, STOOL (REPLACES STOOL CULTURE)  LIPASE, BLOOD  URINALYSIS, ROUTINE W REFLEX MICROSCOPIC  LACTIC ACID, PLASMA  POC OCCULT BLOOD, ED  TROPONIN I (HIGH SENSITIVITY)    EKG EKG Interpretation  Date/Time:  Friday September 10 2020 09:32:67 EDT Ventricular Rate:  76 PR Interval:    QRS Duration: 145 QT Interval:  443 QTC Calculation: 499 R Axis:   -58 Text Interpretation: Sinus rhythm Left bundle branch block Confirmed by Orpah Greek [phone]) on 09/10/2020 12:33:12 AM   Radiology CT ABDOMEN PELVIS W CONTRAST  Result Date: 09/10/2020 CLINICAL DATA:  Severe abdominal pain and diarrhea EXAM: CT ABDOMEN AND PELVIS WITH CONTRAST TECHNIQUE: Multidetector CT imaging of the abdomen and pelvis was performed using the standard protocol following bolus administration of intravenous contrast. CONTRAST:  126m OMNIPAQUE IOHEXOL 300 MG/ML  SOLN COMPARISON:  CT 08/03/2020 FINDINGS: Lower chest: Some dependent atelectatic changes in the most clear lung bases. Cardiac size within normal limits. No pericardial effusion. Pacer leads terminate at the cardiac apex and right atrium. Extensive coronary artery atherosclerosis. Dense calcification of mitral annulus and aortic leaflets as well. Distal thoracic aortic atherosclerosis. Hepatobiliary: No worrisome focal liver lesions. Smooth liver surface contour. Normal hepatic attenuation. Gallbladder is within normal limits for physiologic distension. No visible calcified gallstone or biliary ductal dilatation. Pancreas: No pancreatic ductal  dilatation or surrounding inflammatory changes. Spleen: Normal in size. No concerning splenic lesions. Some mild stranding along the posterior spleen is likely redistributed from adjacent colonic process. Adrenals/Urinary Tract: Normal adrenal glands. Kidneys are normally located with symmetric enhancementand excretion. Stable areas cortical lobulation, possibly scarring. No suspicious renal lesion, urolithiasis or hydronephrosis. Circumferential bladder wall thickening, may be partly reactive. Stomach/Bowel: Distal esophagus, stomach and duodenum are unremarkable. No small bowel thickening or dilatation. Much of the proximal colon is fluid-filled. There is some heterogeneous circumferential mural thickening in the ascending colon (5/49) possibly related to normal peristaltic contraction though should be further evaluated on an outpatient basis. More concerning is a long segment of colonic mural thickening extending from the splenic flexure to the rectosigmoid junction which is within the IMA vascular distribution. Extensive surrounding pericolonic inflammatory changes are present particularly about the distal sigmoid with edematous features of the mesentery and small volume free fluid in the adjacent leaflets. A larger rectal stool ball is noted as well with at most minimal mural thickening at the level of the rectal vault. Vascular/Lymphatic: Extensive atherosclerotic calcification throughout the abdominal aorta and branch vessels. This includes high-grade ostial narrowing and likely occlusion of the IMA origin with some residual opacification of the proximal IMA supplied via back fill and colic collaterals as well as additional high-grade narrowing and a hypodense filling defect seen within the proximal SMA origin which given concomitant occlusion of the IMA origin elsewhere could feasibly result in vascular compromise of the distal colon. No aneurysm or ectasia. No major venous abnormalities. Some reactive  adenopathy in the mesentery. No pathologically enlarged nodes in the abdomen or pelvis. Reproductive: Borderline prostatomegaly.  No focal prostate lesions. Other: Inflammatory changes centered upon the thickened distal colon and more focally in the low pelvis upon the sigmoid. Some mild pre sacral fat stranding as well possibly  related to the rectal stool ball/impaction. Some perivesicular hazy stranding as well. No abdominopelvic free air. No organized collection or abscess. No bowel containing hernias. Musculoskeletal: The osseous structures appear diffusely demineralized which may limit detection of small or nondisplaced fractures. No acute osseous abnormality or suspicious osseous lesion. Multilevel degenerative changes are present in the imaged portions of the spine. Features most pronounced L4-5. Additional degenerative changes in the hips and pelvis. IMPRESSION: 1. Long segment of colonic mural thickening extending from the splenic flexure to the rectosigmoid junction which is within the IMA vascular distribution. Extensive surrounding pericolonic inflammatory changes are present particularly about the distal sigmoid with edematous features of the mesentery and small volume free fluid in the adjacent leaflets. Given a hypodense and possibly acute occlusive filling defect in the severely narrowed SMA origin and suspected ostial occlusion of the IMA, appearance is is worrisome for an acute ischemic colitis. Infectious or inflammatory colitis is possible though less favored. 2. Rectal stool ball with some additional mild rectal wall thickening could reflect impaction or mild stercoral colitis as well. 3. Nonspecific thickening seen in the distal ascending colon. Possibly peristaltic though should consider correlation with outpatient colonoscopy to exclude underlying lesion. 4. Circumferential bladder wall thickening, possibly related to outlet obstruction with borderline prostatomegaly though could correlate  with urinary symptoms and urinalysis to exclude superimposed cystitis. 5. Aortic Atherosclerosis (ICD10-I70.0). Critical Value/emergent results were called by telephone at the time of interpretation on 09/10/2020 at 3:21 am to provider [facility] , who verbally acknowledged these results. Electronically Signed   By: Lovena Le M.D.   On: 09/10/2020 03:22   DG Chest Port 1 View  Result Date: 09/10/2020 CLINICAL DATA:  Abdominal pain EXAM: PORTABLE CHEST 1 VIEW COMPARISON:  04/04/2019 FINDINGS: Left pacer remains in place, unchanged. Heart and mediastinal contours are within normal limits. No focal opacities or effusions. No acute bony abnormality. Aortic atherosclerosis. IMPRESSION: No active cardiopulmonary disease. Electronically Signed   By: Rolm Baptise M.D.   On: 09/10/2020 02:57    Procedures Procedures (including critical care time)  Medications Ordered in ED Medications  piperacillin-tazobactam (ZOSYN) IVPB 3.375 g (has no administration in time range)  lactated ringers bolus 1,000 mL (has no administration in time range)  iohexol (OMNIPAQUE) 300 MG/ML solution 100 mL (100 mLs Intravenous Contrast Given 09/10/20 0252)    ED Course  I have reviewed the triage vital signs and the nursing notes.  Pertinent labs & imaging results that were available during my care of the patient were reviewed by me and considered in my medical decision making (see chart for details).    MDM Rules/Calculators/A&P                          Patient presents to the emergency department for evaluation of abdominal pain and diarrhea.  At arrival to the emergency department, patient appears comfortable.  He initially said he had no complaints but when asked specifically about the abdominal pain he stated he was having some pain.  His exam, however, is benign.  Patient with significant leukocytosis. Rectal exam does reveal formed stool in the rectal vault.  He has not had any diarrhea here in the  emergency department.  He underwent CT scan to further evaluate.  Patient appears to have significant vascular disease in his abdomen.  He has a long segment of colonic mural thickening from the splenic flexure to the rectosigmoid junction.  This is concerning for ischemic  colitis, specifically as he has severely narrowed SMA origin and suspected ostial occlusion of the IMA.  CT also shows rectal stool ball.  He is likely having some overflow incontinence, doubt C. difficile as a cause of his current symptoms.  Hemoccult was negative.  At this time he has no complaints of pain, is sleeping comfortably.  Patient is confirmed to be a DNR.  He seems baseline very confused, cannot answer further questions about wishes for possible interventions.  I did attempt to contact his sister who is listed as his contact at both her home and cell phone, but was unsuccessful.  Discussed with Dr. Arnoldo Morale, on-call for general surgery.  He also feels that the patient is not a candidate for vascular surgery intervention.  Recommends admission with IV fluid resuscitation, antibiotics.  Will consult on the patient.  Final Clinical Impression(s) / ED Diagnoses Final diagnoses:  Ischemic colitis [facility])    Rx / DC Orders ED Discharge Orders    None       Sapphire Tygart, Gwenyth Allegra, MD 09/10/20 0403

## 2020-09-10 NOTE — ED Triage Notes (Signed)
Pt is here via ems from [place] with c/o severe abd pain and diarrhea.

## 2020-09-10 NOTE — ED Notes (Signed)
Received care of patient resting on stretcher.  0 s/s acute distress.  Soft limb restraints in place.  Patient states "I think I am going to stay here."  Offgoing nurse attempted to call report.  Nurse unavailable.  Call bell in reach.

## 2020-09-10 NOTE — H&P (Signed)
History and Physical  Francisco [name] JSH:[id number] DOB: Mar 29, 1946 DOA: 09/10/2020   PCP: Lesia Hausen, PA   Patient coming from: Home  Chief Complaint: abdominal pain  HPI:  Francisco [name] is a 74 y.o. 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  Unfortunately, the patient is a poor historian and much of this history is obtained from review of the medical record.  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  He was discharged to a skilled nursing facility, [place].  There is no reports of nausea, vomiting, chest pain, shortness breath.  He did have some loose stools. In the emergency department, the patient was afebrile hemodynamically stable with oxygen saturation 97% on room air.  WBC 36.3, hemoglobin 10.8, platelets 336,000. Sodium 137, potassium 3.9, serum creatinine 1.27.  LFTs were unremarkable.  Lipase 17.  CT of the abdomen and pelvis showed a long segment colonic mural thickening at the splenic flexure to the rectosigmoid junction and the IMA distribution.  There is extensive pericolonic inflammatory changes about the distal and sigmoid colon with edematous features.   Assessment/Plan: Ischemic colitis -Start IV fluids -General surgery consult -Start IV Zosyn  CKD stage IIIa -Baseline creatinine 0.9-1.2 -Start IV fluids  Seizure disorder -Continue Keppra  Chronic diastolic CHF -Patient is clinically euvolemic -07/01/2015 echo EF 55 to 60%, grade 1 DD  Diabetes mellitus type 2 -NovoLog sliding Vincent  Metastatic  Merkel cell cancer -From medical record it does not appear the patient is a candidate for further radiation therapy -Status post parietal lobe resection  Hyperlipidemia -Continue statin        Past Medical History:  Diagnosis Date  . Anemia   . Anginal pain ([place])    normal coronaries 2016  . Celiac disease   . CHF (congestive heart failure) ([place])   . COPD (chronic obstructive pulmonary disease) ([place])   . GERD (gastroesophageal reflux disease)   . Heart murmur   . History of radiation therapy 10/07/18- 11/04/18   Parotid, left 50 Gy in 20 fractions of 2.5 Gy.   Mar[place] Kitchen Hypertension   . Multiple duodenal ulcers   . parotid gland cancer Left   . Presence of permanent cardiac pacemaker   . Right internal carotid occlusion   . Seizures ([place])   . Shortness of breath   . Symptomatic Bradycardia    a. 07/2015 s/p MDT Advisa L DC PPM (Ser #: [id number]).  . 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 APPLICATION OF CRANIAL NAVIGATION Right 08/04/2020   Procedure: APPLICATION OF CRANIAL NAVIGATION;  Surgeon: Vallarie Mare, MD;  Location: [facility];  Service: Neurosurgery;  Laterality: Right;  . CARDIAC CATHETERIZATION N/A 07/08/2015   Procedure: Left Heart Cath and Coronary Angiography;  Surgeon: Troy Sine, MD;  Location: [facility];  Service: Cardiovascular;  Laterality: N/A;  . CARDIAC CATHETERIZATION N/A 07/08/2015   Procedure: Temporary Pacemaker;  Surgeon: Troy Sine, MD;  Location: [facility];  Service: Cardiovascular;  Laterality: N/A;  . CARDIAC CATHETERIZATION N/A 07/09/2015   Procedure: Temporary Pacemaker;  Surgeon: Joyice Faster  Claiborne Billings, MD;  Location: [facility];  Service: Cardiovascular;  Laterality: N/A;  . CRANIOTOMY Right 08/04/2020   Procedure: RIGHT PARIETAL OCCIPITAL CRANIOTOMY TUMOR EXCISION;  Surgeon: Vallarie Mare, MD;  Location: [facility];  Service: Neurosurgery;   Laterality: Right;  . EP IMPLANTABLE DEVICE N/A 07/09/2015   Procedure: Pacemaker Implant;  Surgeon: Will Meredith Leeds, MD;  Location: [facility];  Service: Cardiovascular;  Laterality: N/A;  . ESOPHAGOGASTRODUODENOSCOPY  07/03/2012   Procedure: ESOPHAGOGASTRODUODENOSCOPY (EGD);  Surgeon: Winfield Cunas., MD;  Location[facility];  Service: Endoscopy;  Laterality: N/A;  . HEMORRHOID SURGERY    . HERNIA REPAIR    . MOLE REMOVAL    . PAROTIDECTOMY Left 08/21/2018   superficial  . PAROTIDECTOMY Left 08/21/2018   Procedure: LEFT SUPERFICIAL PAROTIDECTOMY;  Surgeon: Helayne Seminole, MD;  Location: [facility];  Service: ENT;  Laterality: Left;   Social History:  reports that he has been smoking cigarettes. He has a 12.50 pack-year smoking history. He uses smokeless tobacco. He reports previous alcohol use. He reports that he does not use drugs.   Family History  Problem Relation Age of Onset  . Hodgkin's lymphoma Father      No Known Allergies   Prior to Admission medications   Medication Sig Start Date End Date Taking? Authorizing Provider  allopurinol (ZYLOPRIM) 300 MG tablet TAKE 1 TABLET(300 MG) BY MOUTH DAILY Patient taking differently: Take 300 mg by mouth daily.  05/30/18  Yes Dena Billet B, PA-C  atorvastatin (LIPITOR) 20 MG tablet Take 1 tablet (20 mg total) by mouth daily. 11/23/17  Yes [place], Modena Nunnery, MD  colchicine 0.6 MG tablet Take 1 tablet (0.6 mg total) by mouth 2 (two) times daily as needed (gout flare). Take twice daily for 2 weeks, then use as needed for gout flare up 12/04/17  Yes Eugenie Filler, MD  guaiFENesin (MUCINEX) 600 MG 12 hr tablet Take 600-1,200 mg by mouth every 12 (twelve) hours as needed for cough.    Yes [provider]  guaiFENesin (ROBITUSSIN) 100 MG/5ML liquid Take 200 mg by mouth every 6 (six) hours as needed for cough.    Yes [provider]  levETIRAcetam (KEPPRA) 750 MG tablet Take 1 tablet (750 mg total) by  mouth 2 (two) times daily. 08/14/20  Yes Georgette Shell, MD  lidocaine (XYLOCAINE) 2 % solution Patient: Mix 1part 2% viscous lidocaine, 1part H20. Swish & swallow 17m of diluted mixture, 277m before meals and at bedtime, up to QID 10/21/18  Yes SqEppie GibsonMD  loperamide (IMODIUM A-D) 2 MG tablet Take 2 mg by mouth as needed for diarrhea or loose stools.    Yes [provider]  LORazepam (ATIVAN) 0.5 MG tablet Take 0.5 mg by mouth every 8 (eight) hours as needed for agitation or anxiety. 09/06/20  Yes [provider]  magnesium hydroxide (MILK OF MAGNESIA) 400 MG/5ML suspension Take 30 mLs by mouth at bedtime as needed for mild constipation.   Yes [provider]  naproxen sodium (ALEVE) 220 MG tablet Take 220 mg by mouth every 12 (twelve) hours as needed (joint pain).   Yes [provider]  omeprazole (PRILOSEC) 20 MG capsule Take 20 mg by mouth daily.   Yes [provider]  ondansetron (ZOFRAN) 4 MG tablet Take 1 tablet (4 mg total) by mouth every 4 (four) hours as needed for nausea or vomiting. 08/14/20  Yes MaGeorgette ShellMD  tamsulosin ([facility]0.4  MG CAPS capsule TAKE 1 CAPSULE(0.4 MG) BY MOUTH DAILY Patient taking differently: Take 0.4 mg by mouth daily.  05/30/18  Yes Orlena Sheldon, PA-C  vitamin B-12 (CYANOCOBALAMIN) 500 MCG tablet Take 500 mcg by mouth daily.    Yes [provider]  clotrimazole-betamethasone (LOTRISONE) cream APPLY EXTERNALLY TO THE AFFECTED AREA TWICE DAILY Patient not taking: No sig reported 03/22/17   Alycia Rossetti, MD  dexamethasone (DECADRON) 1 MG tablet Take 1 tablet (1 mg total) by mouth daily. Patient not taking: Reported on 08/18/2020 08/15/20   Georgette Shell, MD  midodrine (PROAMATINE) 10 MG tablet Take 0.5 tablets (5 mg total) by mouth 3 (three) times daily with meals. Patient not taking: Reported on 08/18/2020 08/16/20   Georgette Shell, MD    Review of Systems:    Unobtainable secondary to patient's mental status Physical Exam: Vitals:   09/10/20 0230 09/10/20 0245 09/10/20 0330 09/10/20 0545  BP: (!) 101/49  (!) 95/55 123/62  Pulse:  80 71 76  Resp: 17 14 (!) 22 13  Temp:      TempSrc:      SpO2:  99% 98% 97%  Weight:      Height:       General:  A&O x 1, NAD, nontoxic, pleasant/cooperative Head/Eye: No conjunctival hemorrhage, no icterus, [place]/AT, No nystagmus ENT:  No icterus,  No thrush, good dentition, no pharyngeal exudate Neck:  No masses, no lymphadenpathy, no bruits CV:  RRR, no rub, no gallop, no S3 Lung: Bibasilar rales.  No wheezing.  Good air movement Abdomen: soft/NT, +BS, nondistended, no peritoneal signs Ext: No cyanosis, No rashes, No petechiae, No lymphangitis, No edema Neuro: CNII-XII intact, strength 4/5 in bilateral upper and lower extremities, no dysmetria  Labs on Admission:  Basic Metabolic Panel: Recent Labs  Lab 09/10/20 0124  NA 137  K 3.9  CL 100  CO2 28  GLUCOSE 156*  BUN 26*  CREATININE 1.27*  CALCIUM 8.7*   Liver Function Tests: Recent Labs  Lab 09/10/20 0124  AST 22  ALT 15  ALKPHOS 85  BILITOT 0.6  PROT 5.9*  ALBUMIN 3.0*   Recent Labs  Lab 09/10/20 0124  LIPASE 17   No results for input(s): AMMONIA in the last 168 hours. CBC: Recent Labs  Lab 09/10/20 0124  WBC 36.3*  NEUTROABS 34.2*  HGB 10.8*  HCT 33.0*  MCV 95.7  PLT 336   Coagulation Profile: No results for input(s): INR, PROTIME in the last 168 hours. Cardiac Enzymes: No results for input(s): CKTOTAL, CKMB, CKMBINDEX, TROPONINI in the last 168 hours. BNP: Invalid input(s): POCBNP CBG: No results for input(s): GLUCAP in the last 168 hours. Urine analysis:    Component Value Date/Time   COLORURINE YELLOW 08/02/2020 [place] 08/02/2020 2208   LABSPEC 1.009 08/02/2020 2208   PHURINE 5.0 08/02/2020 2208   GLUCOSEU NEGATIVE 08/02/2020 2208   HGBUR NEGATIVE 08/02/2020 2208   BILIRUBINUR NEGATIVE  08/02/2020 2208   KETONESUR NEGATIVE 08/02/2020 2208   PROTEINUR NEGATIVE 08/02/2020 2208   UROBILINOGEN 0.2 08/13/2014 1010   NITRITE NEGATIVE 08/02/2020 2208   LEUKOCYTESUR NEGATIVE 08/02/2020 2208   Sepsis Labs: @LABRCNTIP (procalcitonin:4,lacticidven:4) ) Recent Results (from the past 240 hour(s))  Respiratory Panel by RT PCR (Flu A&B, Covid) - Nasopharyngeal Swab     Status: None   Collection Time: 09/10/20  1:31 AM   Specimen: Nasopharyngeal Swab  Result Value Ref Range Status   SARS Coronavirus 2 by RT PCR  NEGATIVE NEGATIVE Final    Comment: (NOTE) SARS-CoV-2 target nucleic acids are NOT DETECTED.  The SARS-CoV-2 RNA is generally detectable in upper respiratoy specimens during the acute phase of infection. The lowest concentration of SARS-CoV-2 viral copies this assay can detect is 131 copies/mL. A negative result does not preclude SARS-Cov-2 infection and should not be used as the sole basis for treatment or other patient management decisions. A negative result may occur with  improper specimen collection/handling, submission of specimen other than nasopharyngeal swab, presence of viral mutation(s) within the areas targeted by this assay, and inadequate number of viral copies (<131 copies/mL). A negative result must be combined with clinical observations, patient history, and epidemiological information. The expected result is Negative.  Fact Sheet for Patients:  PinkCheek.be  Fact Sheet for Healthcare Providers:  GravelBags.it  This test is no t yet approved or cleared by the [place] FDA and  has been authorized for detection and/or diagnosis of SARS-CoV-2 by FDA under an Emergency Use Authorization (EUA). This EUA will remain  in effect (meaning this test can be used) for the duration of the COVID-19 declaration under Section 564(b)(1) of the Act, 21 U.S.C. section 360bbb-3(b)(1), unless the  authorization is terminated or revoked sooner.     Influenza A by PCR NEGATIVE NEGATIVE Final   Influenza B by PCR NEGATIVE NEGATIVE Final    Comment: (NOTE) The Xpert Xpress SARS-CoV-2/FLU/RSV assay is intended as an aid in  the diagnosis of influenza from Nasopharyngeal swab specimens and  should not be used as a sole basis for treatment. Nasal washings and  aspirates are unacceptable for Xpert Xpress SARS-CoV-2/FLU/RSV  testing.  Fact Sheet for Patients: PinkCheek.be  Fact Sheet for Healthcare Providers: GravelBags.it  This test is not yet approved or cleared by the [place] FDA and  has been authorized for detection and/or diagnosis of SARS-CoV-2 by  FDA under an Emergency Use Authorization (EUA). This EUA will remain  in effect (meaning this test can be used) for the duration of the  Covid-19 declaration under Section 564(b)(1) of the Act, 21  U.S.C. section 360bbb-3(b)(1), unless the authorization is  terminated or revoked. Performed at [facility], [address], [place], [place] [address]      Radiological Exams on Admission: CT ABDOMEN PELVIS W CONTRAST  Result Date: 09/10/2020 CLINICAL DATA:  Severe abdominal pain and diarrhea EXAM: CT ABDOMEN AND PELVIS WITH CONTRAST TECHNIQUE: Multidetector CT imaging of the abdomen and pelvis was performed using the standard protocol following bolus administration of intravenous contrast. CONTRAST:  155m OMNIPAQUE IOHEXOL 300 MG/ML  SOLN COMPARISON:  CT 08/03/2020 FINDINGS: Lower chest: Some dependent atelectatic changes in the most clear lung bases. Cardiac size within normal limits. No pericardial effusion. Pacer leads terminate at the cardiac apex and right atrium. Extensive coronary artery atherosclerosis. Dense calcification of mitral annulus and aortic leaflets as well. Distal thoracic aortic atherosclerosis. Hepatobiliary: No worrisome focal liver lesions. Smooth  liver surface contour. Normal hepatic attenuation. Gallbladder is within normal limits for physiologic distension. No visible calcified gallstone or biliary ductal dilatation. Pancreas: No pancreatic ductal dilatation or surrounding inflammatory changes. Spleen: Normal in size. No concerning splenic lesions. Some mild stranding along the posterior spleen is likely redistributed from adjacent colonic process. Adrenals/Urinary Tract: Normal adrenal glands. Kidneys are normally located with symmetric enhancementand excretion. Stable areas cortical lobulation, possibly scarring. No suspicious renal lesion, urolithiasis or hydronephrosis. Circumferential bladder wall thickening, may be partly reactive. Stomach/Bowel: Distal esophagus, stomach and duodenum are unremarkable.  No small bowel thickening or dilatation. Much of the proximal colon is fluid-filled. There is some heterogeneous circumferential mural thickening in the ascending colon (5/49) possibly related to normal peristaltic contraction though should be further evaluated on an outpatient basis. More concerning is a long segment of colonic mural thickening extending from the splenic flexure to the rectosigmoid junction which is within the IMA vascular distribution. Extensive surrounding pericolonic inflammatory changes are present particularly about the distal sigmoid with edematous features of the mesentery and small volume free fluid in the adjacent leaflets. A larger rectal stool ball is noted as well with at most minimal mural thickening at the level of the rectal vault. Vascular/Lymphatic: Extensive atherosclerotic calcification throughout the abdominal aorta and branch vessels. This includes high-grade ostial narrowing and likely occlusion of the IMA origin with some residual opacification of the proximal IMA supplied via back fill and colic collaterals as well as additional high-grade narrowing and a hypodense filling defect seen within the proximal SMA  origin which given concomitant occlusion of the IMA origin elsewhere could feasibly result in vascular compromise of the distal colon. No aneurysm or ectasia. No major venous abnormalities. Some reactive adenopathy in the mesentery. No pathologically enlarged nodes in the abdomen or pelvis. Reproductive: Borderline prostatomegaly.  No focal prostate lesions. Other: Inflammatory changes centered upon the thickened distal colon and more focally in the low pelvis upon the sigmoid. Some mild pre sacral fat stranding as well possibly related to the rectal stool ball/impaction. Some perivesicular hazy stranding as well. No abdominopelvic free air. No organized collection or abscess. No bowel containing hernias. Musculoskeletal: The osseous structures appear diffusely demineralized which may limit detection of small or nondisplaced fractures. No acute osseous abnormality or suspicious osseous lesion. Multilevel degenerative changes are present in the imaged portions of the spine. Features most pronounced L4-5. Additional degenerative changes in the hips and pelvis. IMPRESSION: 1. Long segment of colonic mural thickening extending from the splenic flexure to the rectosigmoid junction which is within the IMA vascular distribution. Extensive surrounding pericolonic inflammatory changes are present particularly about the distal sigmoid with edematous features of the mesentery and small volume free fluid in the adjacent leaflets. Given a hypodense and possibly acute occlusive filling defect in the severely narrowed SMA origin and suspected ostial occlusion of the IMA, appearance is is worrisome for an acute ischemic colitis. Infectious or inflammatory colitis is possible though less favored. 2. Rectal stool ball with some additional mild rectal wall thickening could reflect impaction or mild stercoral colitis as well. 3. Nonspecific thickening seen in the distal ascending colon. Possibly peristaltic though should consider  correlation with outpatient colonoscopy to exclude underlying lesion. 4. Circumferential bladder wall thickening, possibly related to outlet obstruction with borderline prostatomegaly though could correlate with urinary symptoms and urinalysis to exclude superimposed cystitis. 5. Aortic Atherosclerosis (ICD10-I70.0). Critical Value/emergent results were called by telephone at the time of interpretation on 09/10/2020 at 3:21 am to provider [facility] , who verbally acknowledged these results. Electronically Signed   By: Lovena Le M.D.   On: 09/10/2020 03:22   DG Chest Port 1 View  Result Date: 09/10/2020 CLINICAL DATA:  Abdominal pain EXAM: PORTABLE CHEST 1 VIEW COMPARISON:  04/04/2019 FINDINGS: Left pacer remains in place, unchanged. Heart and mediastinal contours are within normal limits. No focal opacities or effusions. No acute bony abnormality. Aortic atherosclerosis. IMPRESSION: No active cardiopulmonary disease. Electronically Signed   By: Rolm Baptise M.D.   On: 09/10/2020 02:57    EKG: Independently  reviewed. Sinus LBBB    Time spent:60 minutes Code Status:   DNR Family Communication: sister updated 11/5 Disposition Plan: expect 2-3 day hospitalization Consults called: GI, general surgery DVT Prophylaxis: [place] Lovenox  Orson Eva, DO  [facility] Pager [phone]  If 7PM-7AM, please contact night-coverage [email or web address] Password [email or web address] 09/10/2020, 11:06 AM

## 2020-09-10 NOTE — ED Notes (Signed)
POC occult negative. Stool too formed to collect a sample for  C Diff

## 2020-09-10 NOTE — ED Notes (Signed)
Upon entering room and assessing pt. Pt has removed his 4th IV. Pt was sitting at the end of the bed. Pt is confused and refusing to keep EKG monitor, BP, and pulse ox on at this time.

## 2020-09-10 NOTE — ED Notes (Signed)
Report given to [facility], RN.  She states patient can be transported from ED to room 311.

## 2020-09-10 NOTE — Consult Note (Signed)
Reason for Consult: CT findings of ischemia, abdominal Referring Physician: Dr. Marquette Saa is an 74 y.o. male.  HPI: Patient is a 74 year old white male who was brought into the emergency room by EMS from a nursing home for evaluation and treatment of abdominal pain and diarrhea.  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  Patient is trying to sit up in the bed and pulling his IVs out.  Past Medical History:  Diagnosis Date  . Anemia   . Anginal pain ([place])    normal coronaries 2016  . Celiac disease   . CHF (congestive heart failure) ([place])   . COPD (chronic obstructive pulmonary disease) ([place])   . GERD (gastroesophageal reflux disease)   . Heart murmur   . History of radiation therapy 10/07/18- 11/04/18   Parotid, left 50 Gy in 20 fractions of 2.5 Gy.   Marland Kitchen Hypertension   . Multiple duodenal ulcers   . parotid gland cancer Left   . Presence of permanent cardiac pacemaker   . Right internal carotid occlusion   . Seizures ([place])   . Shortness of breath   . Symptomatic Bradycardia    a. 07/2015 s/p MDT Advisa L DC PPM (Ser #: [id number]).  . 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 APPLICATION OF CRANIAL NAVIGATION Right 08/04/2020   Procedure: APPLICATION OF CRANIAL NAVIGATION;  Surgeon: Vallarie Mare, MD;  Location: [place];  Service: Neurosurgery;  Laterality: Right;  . CARDIAC CATHETERIZATION N/A 07/08/2015   Procedure: Left Heart Cath and Coronary Angiography;  Surgeon: Troy Sine, MD;  Location: [place] [facility];  Service: Cardiovascular;  Laterality: N/A;  . CARDIAC CATHETERIZATION N/A 07/08/2015   Procedure: Temporary Pacemaker;  Surgeon: Troy Sine, MD;  Location: [place] [facility];  Service: Cardiovascular;  Laterality: N/A;  . CARDIAC CATHETERIZATION N/A 07/09/2015   Procedure: Temporary Pacemaker;  Surgeon:  Troy Sine, MD;  Location: [place] [facility];  Service: Cardiovascular;  Laterality: N/A;  . CRANIOTOMY Right 08/04/2020   Procedure: RIGHT PARIETAL OCCIPITAL CRANIOTOMY TUMOR EXCISION;  Surgeon: Vallarie Mare, MD;  Location: [place];  Service: Neurosurgery;  Laterality: Right;  . EP IMPLANTABLE DEVICE N/A 07/09/2015   Procedure: Pacemaker Implant;  Surgeon: Will Meredith Leeds, MD;  Location: [place] [facility];  Service: Cardiovascular;  Laterality: N/A;  . ESOPHAGOGASTRODUODENOSCOPY  07/03/2012   Procedure: ESOPHAGOGASTRODUODENOSCOPY (EGD);  Surgeon: Winfield Cunas., MD;  Location: [facility];  Service: Endoscopy;  Laterality: N/A;  . HEMORRHOID SURGERY    . HERNIA REPAIR    . MOLE REMOVAL    . PAROTIDECTOMY Left 08/21/2018   superficial  . PAROTIDECTOMY Left 08/21/2018   Procedure: LEFT SUPERFICIAL PAROTIDECTOMY;  Surgeon: Helayne Seminole, MD;  Location: [facility];  Service: ENT;  Laterality: Left;    Family History  Problem Relation Age of Onset  . Hodgkin's lymphoma Father     Social History:  reports that he has been smoking cigarettes. He has a 12.50 pack-year smoking history. He uses smokeless tobacco. He reports previous alcohol use. He reports that he does not use drugs.  Allergies: No Known Allergies  Medications: I have reviewed the patient's current medications.  Results for orders placed or performed during the hospital encounter of 09/10/20 (from the past 48 hour(s))  CBC with Differential/Platelet     Status: Abnormal  Collection Time: 09/10/20  1:24 AM  Result Value Ref Range   WBC 36.3 (H) 4.0 - 10.5 K/uL   RBC 3.45 (L) 4.22 - 5.81 MIL/uL   Hemoglobin 10.8 (L) 13.0 - 17.0 g/dL   HCT 33.0 (L) 39 - 52 %   MCV 95.7 80.0 - 100.0 fL   MCH 31.3 26.0 - 34.0 pg   MCHC 32.7 30.0 - 36.0 g/dL   RDW 16.4 (H) 11.5 - 15.5 %   Platelets 336 150 - 400 K/uL   nRBC 0.0 0.0 - 0.2 %   Neutrophils Relative % 94 %   Neutro Abs 34.2 (H) 1.7 - 7.7 K/uL   Lymphocytes  Relative 2 %   Lymphs Abs 0.5 (L) 0.7 - 4.0 K/uL   Monocytes Relative 3 %   Monocytes Absolute 1.0 0.1 - 1.0 K/uL   Eosinophils Relative 0 %   Eosinophils Absolute 0.1 0.0 - 0.5 K/uL   Basophils Relative 0 %   Basophils Absolute 0.1 0.0 - 0.1 K/uL   Immature Granulocytes 1 %   Abs Immature Granulocytes 0.30 (H) 0.00 - 0.07 K/uL    Comment: Performed at [facility], [address], [place], [place] [address]  Comprehensive metabolic panel     Status: Abnormal   Collection Time: 09/10/20  1:24 AM  Result Value Ref Range   Sodium 137 135 - 145 mmol/L   Potassium 3.9 3.5 - 5.1 mmol/L   Chloride 100 98 - 111 mmol/L   CO2 28 22 - 32 mmol/L   Glucose, Bld 156 (H) 70 - 99 mg/dL    Comment: Glucose reference range applies only to samples taken after fasting for at least 8 hours.   BUN 26 (H) 8 - 23 mg/dL   Creatinine, Ser 1.27 (H) 0.61 - 1.24 mg/dL   Calcium 8.7 (L) 8.9 - 10.3 mg/dL   Total Protein 5.9 (L) 6.5 - 8.1 g/dL   Albumin 3.0 (L) 3.5 - 5.0 g/dL   AST 22 15 - 41 U/L   ALT 15 0 - 44 U/L   Alkaline Phosphatase 85 38 - 126 U/L   Total Bilirubin 0.6 0.3 - 1.2 mg/dL   GFR, Estimated 59 (L) >60 mL/min    Comment: (NOTE) Calculated using the CKD-EPI Creatinine Equation (2021)    Anion gap 9 5 - 15    Comment: Performed at [facility], [address] [place], [place] [address]  Troponin I (High Sensitivity)     Status: Abnormal   Collection Time: 09/10/20  1:24 AM  Result Value Ref Range   Troponin I (High Sensitivity) 19 (H) <18 ng/L    Comment: (NOTE) Elevated high sensitivity troponin I (hsTnI) values and significant  changes across serial measurements may suggest ACS but many other  chronic and acute conditions are known to elevate hsTnI results.  Refer to the "Links" section for chest pain algorithms and additional  guidance. Performed at [facility], [address] [place], [place] [address]   Lipase, blood     Status: None   Collection Time: 09/10/20  1:24 AM  Result  Value Ref Range   Lipase 17 11 - 51 U/L    Comment: Performed at [facility], [address], [place], [place] [address]  Respiratory Panel by RT PCR (Flu A&B, Covid) - Nasopharyngeal Swab     Status: None   Collection Time: 09/10/20  1:31 AM   Specimen: Nasopharyngeal Swab  Result Value Ref Range   SARS Coronavirus 2 by  RT PCR NEGATIVE NEGATIVE    Comment: (NOTE) SARS-CoV-2 target nucleic acids are NOT DETECTED.  The SARS-CoV-2 RNA is generally detectable in upper respiratoy specimens during the acute phase of infection. The lowest concentration of SARS-CoV-2 viral copies this assay can detect is 131 copies/mL. A negative result does not preclude SARS-Cov-2 infection and should not be used as the sole basis for treatment or other patient management decisions. A negative result may occur with  improper specimen collection/handling, submission of specimen other than nasopharyngeal swab, presence of viral mutation(s) within the areas targeted by this assay, and inadequate number of viral copies (<131 copies/mL). A negative result must be combined with clinical observations, patient history, and epidemiological information. The expected result is Negative.  Fact Sheet for Patients:  PinkCheek.be  Fact Sheet for Healthcare Providers:  GravelBags.it  This test is no t yet approved or cleared by the [place] FDA and  has been authorized for detection and/or diagnosis of SARS-CoV-2 by FDA under an Emergency Use Authorization (EUA). This EUA will remain  in effect (meaning this test can be used) for the duration of the COVID-19 declaration under Section 564(b)(1) of the Act, 21 U.S.C. section 360bbb-3(b)(1), unless the authorization is terminated or revoked sooner.     Influenza A by PCR NEGATIVE NEGATIVE   Influenza B by PCR NEGATIVE NEGATIVE    Comment: (NOTE) The Xpert Xpress SARS-CoV-2/FLU/RSV assay is intended as an  aid in  the diagnosis of influenza from Nasopharyngeal swab specimens and  should not be used as a sole basis for treatment. Nasal washings and  aspirates are unacceptable for Xpert Xpress SARS-CoV-2/FLU/RSV  testing.  Fact Sheet for Patients: PinkCheek.be  Fact Sheet for Healthcare Providers: GravelBags.it  This test is not yet approved or cleared by the [place] FDA and  has been authorized for detection and/or diagnosis of SARS-CoV-2 by  FDA under an Emergency Use Authorization (EUA). This EUA will remain  in effect (meaning this test can be used) for the duration of the  Covid-19 declaration under Section 564(b)(1) of the Act, 21  U.S.C. section 360bbb-3(b)(1), unless the authorization is  terminated or revoked. Performed at [facility], [address] [place], [place] [address]     CT ABDOMEN PELVIS W CONTRAST  Result Date: 09/10/2020 CLINICAL DATA:  Severe abdominal pain and diarrhea EXAM: CT ABDOMEN AND PELVIS WITH CONTRAST TECHNIQUE: Multidetector CT imaging of the abdomen and pelvis was performed using the standard protocol following bolus administration of intravenous contrast. CONTRAST:  14m OMNIPAQUE IOHEXOL 300 MG/ML  SOLN COMPARISON:  CT 08/03/2020 FINDINGS: Lower chest: Some dependent atelectatic changes in the most clear lung bases. Cardiac size within normal limits. No pericardial effusion. Pacer leads terminate at the cardiac apex and right atrium. Extensive coronary artery atherosclerosis. Dense calcification of mitral annulus and aortic leaflets as well. Distal thoracic aortic atherosclerosis. Hepatobiliary: No worrisome focal liver lesions. Smooth liver surface contour. Normal hepatic attenuation. Gallbladder is within normal limits for physiologic distension. No visible calcified gallstone or biliary ductal dilatation. Pancreas: No pancreatic ductal dilatation or surrounding inflammatory changes. Spleen:  Normal in size. No concerning splenic lesions. Some mild stranding along the posterior spleen is likely redistributed from adjacent colonic process. Adrenals/Urinary Tract: Normal adrenal glands. Kidneys are normally located with symmetric enhancementand excretion. Stable areas cortical lobulation, possibly scarring. No suspicious renal lesion, urolithiasis or hydronephrosis. Circumferential bladder wall thickening, may be partly reactive. Stomach/Bowel: Distal esophagus, stomach and duodenum are unremarkable. No small bowel thickening or dilatation.  Much of the proximal colon is fluid-filled. There is some heterogeneous circumferential mural thickening in the ascending colon (5/49) possibly related to normal peristaltic contraction though should be further evaluated on an outpatient basis. More concerning is a long segment of colonic mural thickening extending from the splenic flexure to the rectosigmoid junction which is within the IMA vascular distribution. Extensive surrounding pericolonic inflammatory changes are present particularly about the distal sigmoid with edematous features of the mesentery and small volume free fluid in the adjacent leaflets. A larger rectal stool ball is noted as well with at most minimal mural thickening at the level of the rectal vault. Vascular/Lymphatic: Extensive atherosclerotic calcification throughout the abdominal aorta and branch vessels. This includes high-grade ostial narrowing and likely occlusion of the IMA origin with some residual opacification of the proximal IMA supplied via back fill and colic collaterals as well as additional high-grade narrowing and a hypodense filling defect seen within the proximal SMA origin which given concomitant occlusion of the IMA origin elsewhere could feasibly result in vascular compromise of the distal colon. No aneurysm or ectasia. No major venous abnormalities. Some reactive adenopathy in the mesentery. No pathologically enlarged nodes  in the abdomen or pelvis. Reproductive: Borderline prostatomegaly.  No focal prostate lesions. Other: Inflammatory changes centered upon the thickened distal colon and more focally in the low pelvis upon the sigmoid. Some mild pre sacral fat stranding as well possibly related to the rectal stool ball/impaction. Some perivesicular hazy stranding as well. No abdominopelvic free air. No organized collection or abscess. No bowel containing hernias. Musculoskeletal: The osseous structures appear diffusely demineralized which may limit detection of small or nondisplaced fractures. No acute osseous abnormality or suspicious osseous lesion. Multilevel degenerative changes are present in the imaged portions of the spine. Features most pronounced L4-5. Additional degenerative changes in the hips and pelvis. IMPRESSION: 1. Long segment of colonic mural thickening extending from the splenic flexure to the rectosigmoid junction which is within the IMA vascular distribution. Extensive surrounding pericolonic inflammatory changes are present particularly about the distal sigmoid with edematous features of the mesentery and small volume free fluid in the adjacent leaflets. Given a hypodense and possibly acute occlusive filling defect in the severely narrowed SMA origin and suspected ostial occlusion of the IMA, appearance is is worrisome for an acute ischemic colitis. Infectious or inflammatory colitis is possible though less favored. 2. Rectal stool ball with some additional mild rectal wall thickening could reflect impaction or mild stercoral colitis as well. 3. Nonspecific thickening seen in the distal ascending colon. Possibly peristaltic though should consider correlation with outpatient colonoscopy to exclude underlying lesion. 4. Circumferential bladder wall thickening, possibly related to outlet obstruction with borderline prostatomegaly though could correlate with urinary symptoms and urinalysis to exclude superimposed  cystitis. 5. Aortic Atherosclerosis (ICD10-I70.0). Critical Value/emergent results were called by telephone at the time of interpretation on 09/10/2020 at 3:21 am to provider [facility] , who verbally acknowledged these results. Electronically Signed   By: Lovena Le M.D.   On: 09/10/2020 03:22   DG Chest Port 1 View  Result Date: 09/10/2020 CLINICAL DATA:  Abdominal pain EXAM: PORTABLE CHEST 1 VIEW COMPARISON:  04/04/2019 FINDINGS: Left pacer remains in place, unchanged. Heart and mediastinal contours are within normal limits. No focal opacities or effusions. No acute bony abnormality. Aortic atherosclerosis. IMPRESSION: No active cardiopulmonary disease. Electronically Signed   By: Rolm Baptise M.D.   On: 09/10/2020 02:57    ROS:  Review of systems not obtained due  to patient factors.  Blood pressure 123/62, pulse 76, temperature 98.9 F (37.2 C), temperature source Oral, resp. rate 13, height 5' 10"  (1.778 m), weight 74.8 kg, SpO2 97 %. Physical Exam: Confused white male in no acute distress Abdominal examination difficult secondary to patient noncompliance.  It was soft and I could not elicit specific point tenderness or rigidity.  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  He does not have an acute surgical abdomen at this time.  Given his overall state, patient is not an ideal surgical candidate.  More than likely would require endovascular intervention to revascularize the abdominal contents.  Given that he is already formed collateralization, this is more chronic in nature.  Given his recent diagnosis, I feel that medical therapy is warranted.  I would start him on IV antibiotics and IV fluids.  Dr. Carles Collet says that he has talked to the family and no surgical intervention is being considered.  I agree with that.  Will follow peripherally with you.  Aviva Signs 09/10/2020, 11:26 AM

## 2020-09-10 NOTE — ED Notes (Signed)
Called [facility].  [phone] [phone] --- staff member said they forgot dnr form. They will bring it over.

## 2020-09-11 DIAGNOSIS — E782 Mixed hyperlipidemia: Secondary | ICD-10-CM

## 2020-09-11 DIAGNOSIS — K559 Vascular disorder of intestine, unspecified: Secondary | ICD-10-CM | POA: Diagnosis not present

## 2020-09-11 DIAGNOSIS — R569 Unspecified convulsions: Secondary | ICD-10-CM

## 2020-09-11 DIAGNOSIS — E1169 Type 2 diabetes mellitus with other specified complication: Secondary | ICD-10-CM

## 2020-09-11 LAB — HEMOGLOBIN A1C
Hgb A1c MFr Bld: 6.1 % — ABNORMAL HIGH (ref 4.8–5.6)
Mean Plasma Glucose: 128.37 mg/dL

## 2020-09-11 LAB — COMPREHENSIVE METABOLIC PANEL
ALT: 13 U/L (ref 0–44)
AST: 24 U/L (ref 15–41)
Albumin: 2.6 g/dL — ABNORMAL LOW (ref 3.5–5.0)
Alkaline Phosphatase: 74 U/L (ref 38–126)
Anion gap: 9 (ref 5–15)
BUN: 22 mg/dL (ref 8–23)
CO2: 27 mmol/L (ref 22–32)
Calcium: 8.4 mg/dL — ABNORMAL LOW (ref 8.9–10.3)
Chloride: 104 mmol/L (ref 98–111)
Creatinine, Ser: 0.9 mg/dL (ref 0.61–1.24)
GFR, Estimated: >60 mL/min (ref >60)
Glucose, Bld: 78 mg/dL (ref 70–99)
Potassium: 3.4 mmol/L — ABNORMAL LOW (ref 3.5–5.1)
Sodium: 140 mmol/L (ref 135–145)
Total Bilirubin: 0.7 mg/dL (ref 0.3–1.2)
Total Protein: 5.5 g/dL — ABNORMAL LOW (ref 6.5–8.1)

## 2020-09-11 LAB — CBC
HCT: 28.7 % — ABNORMAL LOW (ref 39.0–52.0)
Hemoglobin: 9.3 g/dL — ABNORMAL LOW (ref 13.0–17.0)
MCH: 30.2 pg (ref 26.0–34.0)
MCHC: 32.4 g/dL (ref 30.0–36.0)
MCV: 93.2 fL (ref 80.0–100.0)
Platelets: 302 10*3/uL (ref 150–400)
RBC: 3.08 MIL/uL — ABNORMAL LOW (ref 4.22–5.81)
RDW: 16.2 % — ABNORMAL HIGH (ref 11.5–15.5)
WBC: 14.9 10*3/uL — ABNORMAL HIGH (ref 4.0–10.5)
nRBC: 0 % (ref 0.0–0.2)

## 2020-09-11 NOTE — Progress Notes (Signed)
Patient without complaints.  No abdominal pain; watery stools have tapered off.  Rectal tube in place. Tolerating clear liquids. Discussed with nursing staff  Vital signs in last 24 hours:  Temp:  [97.9 F (36.6 C)-99.2 F (37.3 C)] 97.9 F (36.6 C) (11/06 0616) Pulse Rate:  [69-100] 69 (11/06 0616) Resp:  [15-25] 20 (11/06 0616) BP: (107-154)/(58-74) 143/65 (11/06 0616) SpO2:  [93 %-100 %] 96 % (11/06 0616) Last BM Date: 09/10/20 General:   Resting comfortably  Abdomen: Nondistended positive bowel sounds.  Soft and nontender without appreciable mass organomegaly Extremities:  Without clubbing or edema.    Intake/Output from previous day: 11/05 0701 - 11/06 0700 In: 100 [IV Piggyback:100] Out: -  Intake/Output this shift: No intake/output data recorded.  Lab Results: Recent Labs    09/10/20 0124 09/11/20 0634  WBC 36.3* 14.9*  HGB 10.8* 9.3*  HCT 33.0* 28.7*  PLT 336 302   BMET Recent Labs    09/10/20 0124 09/11/20 0634  NA 137 140  K 3.9 3.4*  CL 100 104  CO2 28 27  GLUCOSE 156* 78  BUN 26* 22  CREATININE 1.27* 0.90  CALCIUM 8.7* 8.4*   LFT Recent Labs    09/11/20 0634  PROT 5.5*  ALBUMIN 2.6*  AST 24  ALT 13  ALKPHOS 74  BILITOT 0.7   PT/INR No results for input(s): LABPROT, INR in the last 72 hours. Hepatitis Panel No results for input(s): HEPBSAG, HCVAB, HEPAIGM, HEPBIGM in the last 72 hours. C-Diff No results for input(s): CDIFFTOX in the last 72 hours.  Studies/Results: CT ABDOMEN PELVIS W CONTRAST  Result Date: 09/10/2020 CLINICAL DATA:  Severe abdominal pain and diarrhea EXAM: CT ABDOMEN AND PELVIS WITH CONTRAST TECHNIQUE: Multidetector CT imaging of the abdomen and pelvis was performed using the standard protocol following bolus administration of intravenous contrast. CONTRAST:  15m OMNIPAQUE IOHEXOL 300 MG/ML  SOLN COMPARISON:  CT 08/03/2020 FINDINGS: Lower chest: Some dependent atelectatic changes in the most clear lung bases.  Cardiac size within normal limits. No pericardial effusion. Pacer leads terminate at the cardiac apex and right atrium. Extensive coronary artery atherosclerosis. Dense calcification of mitral annulus and aortic leaflets as well. Distal thoracic aortic atherosclerosis. Hepatobiliary: No worrisome focal liver lesions. Smooth liver surface contour. Normal hepatic attenuation. Gallbladder is within normal limits for physiologic distension. No visible calcified gallstone or biliary ductal dilatation. Pancreas: No pancreatic ductal dilatation or surrounding inflammatory changes. Spleen: Normal in size. No concerning splenic lesions. Some mild stranding along the posterior spleen is likely redistributed from adjacent colonic process. Adrenals/Urinary Tract: Normal adrenal glands. Kidneys are normally located with symmetric enhancementand excretion. Stable areas cortical lobulation, possibly scarring. No suspicious renal lesion, urolithiasis or hydronephrosis. Circumferential bladder wall thickening, may be partly reactive. Stomach/Bowel: Distal esophagus, stomach and duodenum are unremarkable. No small bowel thickening or dilatation. Much of the proximal colon is fluid-filled. There is some heterogeneous circumferential mural thickening in the ascending colon (5/49) possibly related to normal peristaltic contraction though should be further evaluated on an outpatient basis. More concerning is a long segment of colonic mural thickening extending from the splenic flexure to the rectosigmoid junction which is within the IMA vascular distribution. Extensive surrounding pericolonic inflammatory changes are present particularly about the distal sigmoid with edematous features of the mesentery and small volume free fluid in the adjacent leaflets. A larger rectal stool ball is noted as well with at most minimal mural thickening at the level of the rectal vault. Vascular/Lymphatic: Extensive atherosclerotic calcification  throughout the abdominal aorta and branch vessels. This includes high-grade ostial narrowing and likely occlusion of the IMA origin with some residual opacification of the proximal IMA supplied via back fill and colic collaterals as well as additional high-grade narrowing and a hypodense filling defect seen within the proximal SMA origin which given concomitant occlusion of the IMA origin elsewhere could feasibly result in vascular compromise of the distal colon. No aneurysm or ectasia. No major venous abnormalities. Some reactive adenopathy in the mesentery. No pathologically enlarged nodes in the abdomen or pelvis. Reproductive: Borderline prostatomegaly.  No focal prostate lesions. Other: Inflammatory changes centered upon the thickened distal colon and more focally in the low pelvis upon the sigmoid. Some mild pre sacral fat stranding as well possibly related to the rectal stool ball/impaction. Some perivesicular hazy stranding as well. No abdominopelvic free air. No organized collection or abscess. No bowel containing hernias. Musculoskeletal: The osseous structures appear diffusely demineralized which may limit detection of small or nondisplaced fractures. No acute osseous abnormality or suspicious osseous lesion. Multilevel degenerative changes are present in the imaged portions of the spine. Features most pronounced L4-5. Additional degenerative changes in the hips and pelvis. IMPRESSION: 1. Long segment of colonic mural thickening extending from the splenic flexure to the rectosigmoid junction which is within the IMA vascular distribution. Extensive surrounding pericolonic inflammatory changes are present particularly about the distal sigmoid with edematous features of the mesentery and small volume free fluid in the adjacent leaflets. Given a hypodense and possibly acute occlusive filling defect in the severely narrowed SMA origin and suspected ostial occlusion of the IMA, appearance is is worrisome for an  acute ischemic colitis. Infectious or inflammatory colitis is possible though less favored. 2. Rectal stool ball with some additional mild rectal wall thickening could reflect impaction or mild stercoral colitis as well. 3. Nonspecific thickening seen in the distal ascending colon. Possibly peristaltic though should consider correlation with outpatient colonoscopy to exclude underlying lesion. 4. Circumferential bladder wall thickening, possibly related to outlet obstruction with borderline prostatomegaly though could correlate with urinary symptoms and urinalysis to exclude superimposed cystitis. 5. Aortic Atherosclerosis (ICD10-I70.0). Critical Value/emergent results were called by telephone at the time of interpretation on 09/10/2020 at 3:21 am to provider [facility] , who verbally acknowledged these results. Electronically Signed   By: Lovena Le M.D.   On: 09/10/2020 03:22   DG Chest Port 1 View  Result Date: 09/10/2020 CLINICAL DATA:  Abdominal pain EXAM: PORTABLE CHEST 1 VIEW COMPARISON:  04/04/2019 FINDINGS: Left pacer remains in place, unchanged. Heart and mediastinal contours are within normal limits. No focal opacities or effusions. No acute bony abnormality. Aortic atherosclerosis. IMPRESSION: No active cardiopulmonary disease. Electronically Signed   By: Rolm Baptise M.D.   On: 09/10/2020 02:57   Impression:  74 year old gentleman multiple medical problems including celiac disease admitted with diarrhea and diffuse abdominal pain;  soft blood pressures on admission;  initial leukocytosis has improved with antibiotics.  Abdominal pain has resolved follow counts, watery (NON-BLOODY) diarrhea has also tapered off.  Appears clinically improved.  Endorgan ischemia producing segmental colitis remains a possibility as does an infectious process with mesenteric arterial stenosis being a coincidental finding.  He currently does not appear toxic or have a surgical abdomen at this time.   Large stool burden in the rectum noted on CT  Recommendations:  Advance to a gluten-free low residue diet  Continue antibiotics.  Hold off on enemas for the time being  Follow-up on GIP/C. difficile  assay as results become available.    Assessment: Active Problems:   Merkel cell carcinoma of neck (HCC)   Seizure with provoking factor (HCC)   Seizure disorder (HCC)   Mixed hyperlipidemia due to type 2 diabetes mellitus ([place])   Ischemic colitis ([place])    Plan:   LOS: 1 day   Manus Rudd  09/11/2020, 1:04 PM

## 2020-09-11 NOTE — Progress Notes (Signed)
PROGRESS NOTE  Francisco [name] DXA:[id number] DOB: 1946-08-01 DOA: 09/10/2020 PCP: Lesia Hausen, PA  Brief History:   74 y.o. 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  Unfortunately, the patient is a poor historian and much of this history is obtained from review of the medical record.  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  He was discharged to a skilled nursing facility, [place].  There is no reports of nausea, vomiting, chest pain, shortness breath.  He did have some loose stools. In the emergency department, the patient was afebrile hemodynamically stable with oxygen saturation 97% on room air.  WBC 36.3, hemoglobin 10.8, platelets 336,000. Sodium 137, potassium 3.9, serum creatinine 1.27.  LFTs were unremarkable.  Lipase 17.  CT of the abdomen and pelvis showed a long segment colonic mural thickening at the splenic flexure to the rectosigmoid junction and the IMA distribution.  There is extensive pericolonic inflammatory changes about the distal and sigmoid colon with edematous features. Assessment/Plan: Ischemic colitis/Pancolitis -Continue IV fluids -General surgery consult-->poor surgical candidate -Continue IV Zosyn -appreciate GI  CKD stage IIIa -Baseline creatinine 0.9-1.2 -Start IV fluids  Seizure disorder -Continue Keppra  Chronic diastolic CHF -Patient is clinically euvolemic -07/01/2015 echo EF 55 to 60%, grade 1 DD  Diabetes mellitus type 2 -09/11/20 A1C--6.1 -d/c ISS  Metastatic Merkel cell  cancer -From medical record it does not appear the patient is a candidate for further radiation therapy -Status post parietal lobe resection  Hyperlipidemia -Continue statin  Goals of Care -discussed with sister, HPOA--she would not want any aggressive intervention -she is agreeable to medical therapy to see if patient continues to improve -if no improvement or worsens-->comfort care -confirmed DNR        Status is: Inpatient  Remains inpatient appropriate because:IV treatments appropriate due to intensity of illness or inability to take PO   Dispo: The patient is from: SNF              Anticipated d/c is to: SNF              Anticipated d/c date is: 2 days              Patient currently is not medically stable to d/c.        Family Communication:   Sister updated 11/5  Consultants:  GI, general surgery  Code Status:  DNR  DVT Prophylaxis:  [place] Lovenox   Procedures: As Listed in Progress Note Above  Antibiotics: Zosyn 11/6>>>      Subjective: Patient is pleasantly confuse, but he follows one step commands.  Denies cp, sob, abd pain.  Remainder ROS unobtainable  Objective: Vitals:   09/10/20 1807 09/10/20 1843 09/10/20 2025 09/11/20 0616  BP: (!) 107/58 110/74 123/69 (!) 143/65  Pulse: 100 89 79 69  Resp: (!) 22 16 15 20   Temp:  99.2 F (37.3 C) 97.9 F (36.6 C) 97.9 F (36.6 C)  TempSrc:  Oral Oral Axillary  SpO2: 98% 93% 93% 96%  Weight:      Height:        Intake/Output Summary (Last 24 hours)  at 09/11/2020 1631 Last data filed at 09/11/2020 1532 Gross per 24 hour  Intake 2009.97 ml  Output --  Net 2009.97 ml   Weight change:  Exam:   General:  Pt is alert, follows commands intermittently, not in acute distress  HEENT: No icterus, No thrush, No neck mass, [place]/AT  Cardiovascular: RRR, S1/S2, no rubs, no gallops  Respiratory: bibasilar crackles. No wheeze  Abdomen: Soft/+BS, non tender, non distended, no  guarding  Extremities: No edema, No lymphangitis, No petechiae, No rashes, no synovitis   Data Reviewed: I have personally reviewed following labs and imaging studies Basic Metabolic Panel: Recent Labs  Lab 09/10/20 0124 09/11/20 0634  NA 137 140  K 3.9 3.4*  CL 100 104  CO2 28 27  GLUCOSE 156* 78  BUN 26* 22  CREATININE 1.27* 0.90  CALCIUM 8.7* 8.4*   Liver Function Tests: Recent Labs  Lab 09/10/20 0124 09/11/20 0634  AST 22 24  ALT 15 13  ALKPHOS 85 74  BILITOT 0.6 0.7  PROT 5.9* 5.5*  ALBUMIN 3.0* 2.6*   Recent Labs  Lab 09/10/20 0124  LIPASE 17   No results for input(s): AMMONIA in the last 168 hours. Coagulation Profile: No results for input(s): INR, PROTIME in the last 168 hours. CBC: Recent Labs  Lab 09/10/20 0124 09/11/20 0634  WBC 36.3* 14.9*  NEUTROABS 34.2*  --   HGB 10.8* 9.3*  HCT 33.0* 28.7*  MCV 95.7 93.2  PLT 336 302   Cardiac Enzymes: No results for input(s): CKTOTAL, CKMB, CKMBINDEX, TROPONINI in the last 168 hours. BNP: Invalid input(s): POCBNP CBG: No results for input(s): GLUCAP in the last 168 hours. HbA1C: Recent Labs    09/11/20 0634  HGBA1C 6.1*   Urine analysis:    Component Value Date/Time   COLORURINE YELLOW 08/02/2020 [place] 08/02/2020 2208   LABSPEC 1.009 08/02/2020 2208   PHURINE 5.0 08/02/2020 2208   GLUCOSEU NEGATIVE 08/02/2020 2208   HGBUR NEGATIVE 08/02/2020 2208   BILIRUBINUR NEGATIVE 08/02/2020 2208   KETONESUR NEGATIVE 08/02/2020 2208   PROTEINUR NEGATIVE 08/02/2020 2208   UROBILINOGEN 0.2 08/13/2014 1010   NITRITE NEGATIVE 08/02/2020 2208   LEUKOCYTESUR NEGATIVE 08/02/2020 2208   Sepsis Labs: @LABRCNTIP (procalcitonin:4,lacticidven:4) ) Recent Results (from the past 240 hour(s))  Respiratory Panel by RT PCR (Flu A&B, Covid) - Nasopharyngeal Swab     Status: None   Collection Time: 09/10/20  1:31 AM   Specimen: Nasopharyngeal Swab  Result Value Ref Range Status   SARS  Coronavirus 2 by RT PCR NEGATIVE NEGATIVE Final    Comment: (NOTE) SARS-CoV-2 target nucleic acids are NOT DETECTED.  The SARS-CoV-2 RNA is generally detectable in upper respiratoy specimens during the acute phase of infection. The lowest concentration of SARS-CoV-2 viral copies this assay can detect is 131 copies/mL. A negative result does not preclude SARS-Cov-2 infection and should not be used as the sole basis for treatment or other patient management decisions. A negative result may occur with  improper specimen collection/handling, submission of specimen other than nasopharyngeal swab, presence of viral mutation(s) within the areas targeted by this assay, and inadequate number of viral copies (<131 copies/mL). A negative result must be combined with clinical observations, patient history, and epidemiological information. The expected result is Negative.  Fact Sheet for Patients:  PinkCheek.be  Fact Sheet for Healthcare Providers:  GravelBags.it  This test is no t yet approved or cleared by the [place] FDA and  has been authorized for detection  and/or diagnosis of SARS-CoV-2 by FDA under an Emergency Use Authorization (EUA). This EUA will remain  in effect (meaning this test can be used) for the duration of the COVID-19 declaration under Section 564(b)(1) of the Act, 21 U.S.C. section 360bbb-3(b)(1), unless the authorization is terminated or revoked sooner.     Influenza A by PCR NEGATIVE NEGATIVE Final   Influenza B by PCR NEGATIVE NEGATIVE Final    Comment: (NOTE) The Xpert Xpress SARS-CoV-2/FLU/RSV assay is intended as an aid in  the diagnosis of influenza from Nasopharyngeal swab specimens and  should not be used as a sole basis for treatment. Nasal washings and  aspirates are unacceptable for Xpert Xpress SARS-CoV-2/FLU/RSV  testing.  Fact Sheet for  Patients: PinkCheek.be  Fact Sheet for Healthcare Providers: GravelBags.it  This test is not yet approved or cleared by the [place] FDA and  has been authorized for detection and/or diagnosis of SARS-CoV-2 by  FDA under an Emergency Use Authorization (EUA). This EUA will remain  in effect (meaning this test can be used) for the duration of the  Covid-19 declaration under Section 564(b)(1) of the Act, 21  U.S.C. section 360bbb-3(b)(1), unless the authorization is  terminated or revoked. Performed at [facility], [address] [place], [place] [address]      Scheduled Meds: . allopurinol  300 mg Oral Daily  . atorvastatin  20 mg Oral Daily  . enoxaparin (LOVENOX) injection  40 mg Subcutaneous Q24H  . levETIRAcetam  750 mg Oral BID  . tamsulosin  0.4 mg Oral Daily  . vitamin B-12  500 mcg Oral Daily   Continuous Infusions: . sodium chloride 100 mL/hr at 09/10/20 1126  . piperacillin-tazobactam (ZOSYN)  IV 3.375 g (09/11/20 1511)    Procedures/Studies: CT HEAD WO CONTRAST  Result Date: 08/14/2020 CLINICAL DATA:  Dizziness. Mental status changes. Status post resection of metastatic Merkel cell tumor 08/04/2020 EXAM: CT HEAD WITHOUT CONTRAST TECHNIQUE: Contiguous axial images were obtained from the base of the skull through the vertex without intravenous contrast. COMPARISON:  CT head without contrast 08/05/2020 FINDINGS: Brain: Patient is status post resection of tumor in the posterior right parietal and occipital lobe. Irregular high density tissue is present along the falx and posterior corpus callosum. Surgical cavity is noted. Extent of pneumocephalus is decreasing. There is still some mass effect on the right with partial effacement of sulci posteriorly. CSF density extra-axial fluid collection is now present on the left, measuring up to 4 mm. The brainstem and cerebellum are within normal limits. Vascular:  Atherosclerotic calcifications are present within the cavernous internal carotid arteries. No hyperdense vessel is present. Skull: Right parietal craniotomy is again noted. Calvarium is otherwise within normal limits. No significant extracranial soft tissue lesion is present. Sinuses/Orbits: The paranasal sinuses and mastoid air cells are clear. The globes and orbits are within normal limits. IMPRESSION: 1. Status post resection of tumor in the posterior right parietal and occipital lobe. 2. Irregular high density tissue along the falx and posterior corpus callosum is concerning for residual or recurrent tumor. MRI of the head without and with contrast may be helpful for further evaluation. 3. Improved pneumocephalus. 4. New CSF density extra-axial fluid collection on the left, measuring up to 4 mm. 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 Electronically Signed   By: San Morelle M.D.   On: 08/14/2020 17:30   CT ABDOMEN PELVIS W CONTRAST  Result Date: 09/10/2020 CLINICAL DATA:  Severe abdominal pain and diarrhea EXAM: CT ABDOMEN AND  PELVIS WITH CONTRAST TECHNIQUE: Multidetector CT imaging of the abdomen and pelvis was performed using the standard protocol following bolus administration of intravenous contrast. CONTRAST:  171m OMNIPAQUE IOHEXOL 300 MG/ML  SOLN COMPARISON:  CT 08/03/2020 FINDINGS: Lower chest: Some dependent atelectatic changes in the most clear lung bases. Cardiac size within normal limits. No pericardial effusion. Pacer leads terminate at the cardiac apex and right atrium. Extensive coronary artery atherosclerosis. Dense calcification of mitral annulus and aortic leaflets as well. Distal thoracic aortic atherosclerosis. Hepatobiliary: No worrisome focal liver lesions. Smooth liver surface contour. Normal hepatic attenuation. Gallbladder is within normal limits for physiologic distension. No visible calcified gallstone or biliary ductal dilatation. Pancreas: No pancreatic ductal  dilatation or surrounding inflammatory changes. Spleen: Normal in size. No concerning splenic lesions. Some mild stranding along the posterior spleen is likely redistributed from adjacent colonic process. Adrenals/Urinary Tract: Normal adrenal glands. Kidneys are normally located with symmetric enhancementand excretion. Stable areas cortical lobulation, possibly scarring. No suspicious renal lesion, urolithiasis or hydronephrosis. Circumferential bladder wall thickening, may be partly reactive. Stomach/Bowel: Distal esophagus, stomach and duodenum are unremarkable. No small bowel thickening or dilatation. Much of the proximal colon is fluid-filled. There is some heterogeneous circumferential mural thickening in the ascending colon (5/49) possibly related to normal peristaltic contraction though should be further evaluated on an outpatient basis. More concerning is a long segment of colonic mural thickening extending from the splenic flexure to the rectosigmoid junction which is within the IMA vascular distribution. Extensive surrounding pericolonic inflammatory changes are present particularly about the distal sigmoid with edematous features of the mesentery and small volume free fluid in the adjacent leaflets. A larger rectal stool ball is noted as well with at most minimal mural thickening at the level of the rectal vault. Vascular/Lymphatic: Extensive atherosclerotic calcification throughout the abdominal aorta and branch vessels. This includes high-grade ostial narrowing and likely occlusion of the IMA origin with some residual opacification of the proximal IMA supplied via back fill and colic collaterals as well as additional high-grade narrowing and a hypodense filling defect seen within the proximal SMA origin which given concomitant occlusion of the IMA origin elsewhere could feasibly result in vascular compromise of the distal colon. No aneurysm or ectasia. No major venous abnormalities. Some reactive  adenopathy in the mesentery. No pathologically enlarged nodes in the abdomen or pelvis. Reproductive: Borderline prostatomegaly.  No focal prostate lesions. Other: Inflammatory changes centered upon the thickened distal colon and more focally in the low pelvis upon the sigmoid. Some mild pre sacral fat stranding as well possibly related to the rectal stool ball/impaction. Some perivesicular hazy stranding as well. No abdominopelvic free air. No organized collection or abscess. No bowel containing hernias. Musculoskeletal: The osseous structures appear diffusely demineralized which may limit detection of small or nondisplaced fractures. No acute osseous abnormality or suspicious osseous lesion. Multilevel degenerative changes are present in the imaged portions of the spine. Features most pronounced L4-5. Additional degenerative changes in the hips and pelvis. IMPRESSION: 1. Long segment of colonic mural thickening extending from the splenic flexure to the rectosigmoid junction which is within the IMA vascular distribution. Extensive surrounding pericolonic inflammatory changes are present particularly about the distal sigmoid with edematous features of the mesentery and small volume free fluid in the adjacent leaflets. Given a hypodense and possibly acute occlusive filling defect in the severely narrowed SMA origin and suspected ostial occlusion of the IMA, appearance is is worrisome for an acute ischemic colitis. Infectious or inflammatory colitis is possible though  less favored. 2. Rectal stool ball with some additional mild rectal wall thickening could reflect impaction or mild stercoral colitis as well. 3. Nonspecific thickening seen in the distal ascending colon. Possibly peristaltic though should consider correlation with outpatient colonoscopy to exclude underlying lesion. 4. Circumferential bladder wall thickening, possibly related to outlet obstruction with borderline prostatomegaly though could correlate  with urinary symptoms and urinalysis to exclude superimposed cystitis. 5. Aortic Atherosclerosis (ICD10-I70.0). Critical Value/emergent results were called by telephone at the time of interpretation on 09/10/2020 at 3:21 am to provider [facility] , who verbally acknowledged these results. Electronically Signed   By: Lovena Le M.D.   On: 09/10/2020 03:22   DG Chest Port 1 View  Result Date: 09/10/2020 CLINICAL DATA:  Abdominal pain EXAM: PORTABLE CHEST 1 VIEW COMPARISON:  04/04/2019 FINDINGS: Left pacer remains in place, unchanged. Heart and mediastinal contours are within normal limits. No focal opacities or effusions. No acute bony abnormality. Aortic atherosclerosis. IMPRESSION: No active cardiopulmonary disease. Electronically Signed   By: Rolm Baptise M.D.   On: 09/10/2020 02:57   EEG adult  Result Date: 08/16/2020 Lora Havens, MD     08/16/2020 11:15 AM Patient Name: PONCE SKILLMAN MRN: [id number] Epilepsy Attending: Lora Havens Referring Physician/Provider: Dr. Jacki Cones Date: 08/16/2020 Duration: 24.30 minutes Patient history: 74 year old male with right parieto-occipital craniotomy and tumor resection, epilepsy who presented with breakthrough seizure.  EEG to evaluate for seizure. Level of alertness: Awake AEDs during EEG study: Keppra Technical aspects: This EEG study was done with scalp electrodes positioned according to the 10-20 International system of electrode placement. Electrical activity was acquired at a sampling rate of 500Hz  and reviewed with a high frequency filter of 70Hz  and a low frequency filter of 1Hz . EEG data were recorded continuously and digitally stored. Description: The posterior dominant rhythm consists of 9 Hz activity of moderate voltage (25-35 uV) seen predominantly in posterior head regions, asymmetric ( R<L) and reactive to eye opening and eye closing.  EEG showed continuous 3 to 5 Hz theta-delta slowing in right parieto-occipital region.   Single sharp transient was seen in right occipital region.  Physiologic photic driving was not seen during photic stimulation.  Hyperventilation was not performed.   ABNORMALITY -Continuous slow, right parieto-occipital region -Background asymmetry, right less than left IMPRESSION: This study is suggestive of cortical dysfunction in right parieto-occipital region consistent with underlying craniotomy.  No seizures or definite epileptiform discharges were seen throughout the recording. [place], DO  [facility]  If 7PM-7AM, please contact night-coverage [email or web address] Password [email or web address] 09/11/2020, 4:31 PM   LOS: 1 day

## 2020-09-11 NOTE — Progress Notes (Signed)
3:54 AM RN called due to patient having 4+ watery stools and onset of skin breakage. Flexi-Seal was ordered.

## 2020-09-12 LAB — CBC
HCT: 29.6 % — ABNORMAL LOW (ref 39.0–52.0)
Hemoglobin: 9.7 g/dL — ABNORMAL LOW (ref 13.0–17.0)
MCH: 30.2 pg (ref 26.0–34.0)
MCHC: 32.8 g/dL (ref 30.0–36.0)
MCV: 92.2 fL (ref 80.0–100.0)
Platelets: 297 10*3/uL (ref 150–400)
RBC: 3.21 MIL/uL — ABNORMAL LOW (ref 4.22–5.81)
RDW: 15.9 % — ABNORMAL HIGH (ref 11.5–15.5)
WBC: 10.4 10*3/uL (ref 4.0–10.5)
nRBC: 0 % (ref 0.0–0.2)

## 2020-09-12 LAB — COMPREHENSIVE METABOLIC PANEL
ALT: 13 U/L (ref 0–44)
AST: 24 U/L (ref 15–41)
Albumin: 2.7 g/dL — ABNORMAL LOW (ref 3.5–5.0)
Alkaline Phosphatase: 69 U/L (ref 38–126)
Anion gap: 9 (ref 5–15)
BUN: 14 mg/dL (ref 8–23)
CO2: 26 mmol/L (ref 22–32)
Calcium: 8.5 mg/dL — ABNORMAL LOW (ref 8.9–10.3)
Chloride: 103 mmol/L (ref 98–111)
Creatinine, Ser: 0.82 mg/dL (ref 0.61–1.24)
GFR, Estimated: >60 mL/min (ref >60)
Glucose, Bld: 73 mg/dL (ref 70–99)
Potassium: 3 mmol/L — ABNORMAL LOW (ref 3.5–5.1)
Sodium: 138 mmol/L (ref 135–145)
Total Bilirubin: 0.6 mg/dL (ref 0.3–1.2)
Total Protein: 5.4 g/dL — ABNORMAL LOW (ref 6.5–8.1)

## 2020-09-12 LAB — MAGNESIUM: Magnesium: 1.8 mg/dL (ref 1.7–2.4)

## 2020-09-12 MED ORDER — POTASSIUM CHLORIDE IN NACL 40-0.9 MEQ/L-% IV SOLN: INTRAVENOUS | Status: DC

## 2020-09-12 MED ORDER — POTASSIUM CHLORIDE CRYS ER 20 MEQ PO TBCR
20.0000 meq | EXTENDED_RELEASE_TABLET | Freq: Once | ORAL | Status: AC
  Administered 2020-09-12: 20 meq via ORAL
  Filled 2020-09-12: qty 1

## 2020-09-12 NOTE — Progress Notes (Signed)
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 Unfortunately, the patient is a poor historian and much of this history is obtained from review of the medical record. 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 He was discharged to a skilled nursing facility,[name].There is no reports of nausea, vomiting, chest pain, shortness breath. He did have some loose stools. In the emergency department, the patient was afebrile hemodynamically stable with oxygen saturation 97% on room air. WBC 36.3, hemoglobin 10.8, platelets 336,000. Sodium 137, potassium 3.9, serum creatinine 1.27. LFTs were unremarkable. Lipase 17. CT of the abdomen and pelvis showed a long segment colonic mural thickening at the splenic flexure to the rectosigmoid junction and the IMA distribution. There is extensive pericolonic inflammatory changes about the distal and sigmoid colon with edematous features. Assessment/Plan: Ischemic colitis/Pancolitis -Continue IV fluids -General surgery consult-->poor surgical candidate -Continue IV Zosyn -appreciate GI -advanced diet  CKD stage IIIa -Baseline creatinine 0.9-1.2 -Continue IV fluids  Seizure disorder -Continue Keppra  Chronic diastolic CHF -Patient is clinically euvolemic -07/01/2015 echo EF 55 to 60%, grade 1 DD  Diabetes mellitus type 2 -09/11/20 A1C--6.1 -d/c  ISS  Metastatic Merkel cell cancer -From medical record it does not appear the patient is a candidate for further radiation therapy -Status post parietal lobe resection  Hyperlipidemia -Continue statin  Hypokalemia -replete -mag 1.8  Goals of Care -discussed with sister, HPOA--she would not want any aggressive intervention -she is agreeable to medical therapy to see if patient continues to improve -if no improvement or worsens-->comfort care -confirmed DNR        Status is: Inpatient  Remains inpatient appropriate because:IV treatments appropriate due to intensity of illness or inability to take PO   Dispo: The patient is from: SNF  Anticipated d/c is to: SNF  Anticipated d/c date is: 2 days  Patient currently is not medically stable to d/c.        Family Communication:   Sister updated 11/5  Consultants:  GI, general surgery  Code Status:  DNR  DVT Prophylaxis:  [place] Lovenox   Procedures: As Listed in Progress Note Above  Antibiotics: Zosyn 11/6>>>       Subjective: Patient is more alert, but pleasantly confused.  Denies f/c, cp, sob, n/v/.  abd pain is improving  Objective: Vitals:   09/11/20 0616 09/11/20 2156 09/12/20 0511 09/12/20 1500  BP: (!) 143/65 (!) 156/83 140/74 (!) 142/62  Pulse: 69 81 60 79  Resp: 20 18 18 18   Temp: 97.9 F (36.6 C) 97.8 F (36.6 C) 97.7 F (36.5 C) 97.9 F (36.6 C)  TempSrc: Axillary Axillary Axillary Oral  SpO2: 96% 98% 97% 98%  Weight:      Height:        Intake/Output Summary (Last 24 hours) at 09/12/2020 1712 Last data filed at 09/12/2020 1502 Gross per 24 hour  Intake 480 ml  Output 750 ml  Net -270 ml   Weight change:  Exam:   General:  Pt is alert, follows commands intermittently, not in acute distress  HEENT: No icterus, No thrush, No neck mass, [place]/AT  Cardiovascular: RRR, S1/S2, no rubs, no gallops  Respiratory:  bibasilar rales, no wheeze  Abdomen: Soft/+BS, non tender, non distended, no guarding  Extremities: No edema, No lymphangitis, No petechiae, No rashes, no synovitis   Data Reviewed: I have personally reviewed following labs and imaging studies Basic Metabolic Panel: Recent Labs  Lab 09/10/20 0124 09/11/20 0634 09/12/20 0535  NA 137 140 138  K 3.9 3.4* 3.0*  CL 100 104 103  CO2 28 27 26   GLUCOSE 156* 78 73  BUN 26* 22 14  CREATININE 1.27* 0.90 0.82  CALCIUM 8.7* 8.4* 8.5*  MG  --   --  1.8   Liver Function Tests: Recent Labs  Lab 09/10/20 0124 09/11/20 0634 09/12/20 0535  AST 22 24 24   ALT 15 13 13   ALKPHOS 85 74 69  BILITOT 0.6 0.7 0.6  PROT 5.9* 5.5* 5.4*  ALBUMIN 3.0* 2.6* 2.7*   Recent Labs  Lab 09/10/20 0124  LIPASE 17   No results for input(s): AMMONIA in the last 168 hours. Coagulation Profile: No results for input(s): INR, PROTIME in the last 168 hours. CBC: Recent Labs  Lab 09/10/20 0124 09/11/20 0634 09/12/20 0535  WBC 36.3* 14.9* 10.4  NEUTROABS 34.2*  --   --   HGB 10.8* 9.3* 9.7*  HCT 33.0* 28.7* 29.6*  MCV 95.7 93.2 92.2  PLT 336 302 297   Cardiac Enzymes: No results for input(s): CKTOTAL, CKMB, CKMBINDEX, TROPONINI in the last 168 hours. BNP: Invalid input(s): POCBNP CBG: No results for input(s): GLUCAP in the last 168 hours. HbA1C: Recent Labs    09/11/20 0634  HGBA1C 6.1*   Urine analysis:    Component Value Date/Time   COLORURINE YELLOW 08/02/2020 [place] 08/02/2020 2208   LABSPEC 1.009 08/02/2020 2208   PHURINE 5.0 08/02/2020 2208   GLUCOSEU NEGATIVE 08/02/2020 2208   HGBUR NEGATIVE 08/02/2020 2208   BILIRUBINUR NEGATIVE 08/02/2020 2208   KETONESUR NEGATIVE 08/02/2020 2208   PROTEINUR NEGATIVE 08/02/2020 2208   UROBILINOGEN 0.2 08/13/2014 1010   NITRITE NEGATIVE 08/02/2020 2208   LEUKOCYTESUR NEGATIVE 08/02/2020 2208   Sepsis Labs: @LABRCNTIP (procalcitonin:4,lacticidven:4) ) Recent Results  (from the past 240 hour(s))  Respiratory Panel by RT PCR (Flu A&B, Covid) - Nasopharyngeal Swab     Status: None   Collection Time: 09/10/20  1:31 AM   Specimen: Nasopharyngeal Swab  Result Value Ref Range Status   SARS Coronavirus 2 by RT PCR NEGATIVE NEGATIVE Final    Comment: (NOTE) SARS-CoV-2 target nucleic acids are NOT DETECTED.  The SARS-CoV-2 RNA is generally detectable in upper respiratoy specimens during the acute phase of infection. The lowest concentration of SARS-CoV-2 viral copies this assay can detect is 131 copies/mL. A negative result does not preclude SARS-Cov-2 infection and should not be used as the sole basis for treatment or other patient management decisions. A negative result may occur with  improper specimen collection/handling, submission of specimen other than nasopharyngeal swab, presence of viral mutation(s) within the areas targeted by this assay, and inadequate number of viral copies (<131 copies/mL). A negative result must be combined with clinical observations, patient history, and epidemiological information. The expected result is Negative.  Fact Sheet for Patients:  PinkCheek.be  Fact Sheet for Healthcare Providers:  GravelBags.it  This test is no t yet approved or cleared by the [place] FDA and  has been authorized for detection and/or diagnosis of SARS-CoV-2 by FDA under an  Emergency Use Authorization (EUA). This EUA will remain  in effect (meaning this test can be used) for the duration of the COVID-19 declaration under Section 564(b)(1) of the Act, 21 U.S.C. section 360bbb-3(b)(1), unless the authorization is terminated or revoked sooner.     Influenza A by PCR NEGATIVE NEGATIVE Final   Influenza B by PCR NEGATIVE NEGATIVE Final    Comment: (NOTE) The Xpert Xpress SARS-CoV-2/FLU/RSV assay is intended as an aid in  the diagnosis of influenza from Nasopharyngeal swab specimens  and  should not be used as a sole basis for treatment. Nasal washings and  aspirates are unacceptable for Xpert Xpress SARS-CoV-2/FLU/RSV  testing.  Fact Sheet for Patients: PinkCheek.be  Fact Sheet for Healthcare Providers: GravelBags.it  This test is not yet approved or cleared by the [place] FDA and  has been authorized for detection and/or diagnosis of SARS-CoV-2 by  FDA under an Emergency Use Authorization (EUA). This EUA will remain  in effect (meaning this test can be used) for the duration of the  Covid-19 declaration under Section 564(b)(1) of the Act, 21  U.S.C. section 360bbb-3(b)(1), unless the authorization is  terminated or revoked. Performed at [facility], [address], [place], [place] [address]      Scheduled Meds: . allopurinol  300 mg Oral Daily  . atorvastatin  20 mg Oral Daily  . enoxaparin (LOVENOX) injection  40 mg Subcutaneous Q24H  . levETIRAcetam  750 mg Oral BID  . tamsulosin  0.4 mg Oral Daily  . vitamin B-12  500 mcg Oral Daily   Continuous Infusions: . sodium chloride 100 mL/hr at 09/10/20 1126  . piperacillin-tazobactam (ZOSYN)  IV 3.375 g (09/12/20 1422)    Procedures/Studies: CT HEAD WO CONTRAST  Result Date: 08/14/2020 CLINICAL DATA:  Dizziness. Mental status changes. Status post resection of metastatic Merkel cell tumor 08/04/2020 EXAM: CT HEAD WITHOUT CONTRAST TECHNIQUE: Contiguous axial images were obtained from the base of the skull through the vertex without intravenous contrast. COMPARISON:  CT head without contrast 08/05/2020 FINDINGS: Brain: Patient is status post resection of tumor in the posterior right parietal and occipital lobe. Irregular high density tissue is present along the falx and posterior corpus callosum. Surgical cavity is noted. Extent of pneumocephalus is decreasing. There is still some mass effect on the right with partial effacement of sulci posteriorly.  CSF density extra-axial fluid collection is now present on the left, measuring up to 4 mm. The brainstem and cerebellum are within normal limits. Vascular: Atherosclerotic calcifications are present within the cavernous internal carotid arteries. No hyperdense vessel is present. Skull: Right parietal craniotomy is again noted. Calvarium is otherwise within normal limits. No significant extracranial soft tissue lesion is present. Sinuses/Orbits: The paranasal sinuses and mastoid air cells are clear. The globes and orbits are within normal limits. IMPRESSION: 1. Status post resection of tumor in the posterior right parietal and occipital lobe. 2. Irregular high density tissue along the falx and posterior corpus callosum is concerning for residual or recurrent tumor. MRI of the head without and with contrast may be helpful for further evaluation. 3. Improved pneumocephalus. 4. New CSF density extra-axial fluid collection on the left, measuring up to 4 mm. 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 Electronically Signed   By: San Morelle M.D.   On: 08/14/2020 17:30   CT ABDOMEN PELVIS W CONTRAST  Result Date: 09/10/2020 CLINICAL DATA:  Severe abdominal pain and diarrhea EXAM: CT ABDOMEN AND PELVIS WITH CONTRAST TECHNIQUE: Multidetector CT imaging of  the abdomen and pelvis was performed using the standard protocol following bolus administration of intravenous contrast. CONTRAST:  137m OMNIPAQUE IOHEXOL 300 MG/ML  SOLN COMPARISON:  CT 08/03/2020 FINDINGS: Lower chest: Some dependent atelectatic changes in the most clear lung bases. Cardiac size within normal limits. No pericardial effusion. Pacer leads terminate at the cardiac apex and right atrium. Extensive coronary artery atherosclerosis. Dense calcification of mitral annulus and aortic leaflets as well. Distal thoracic aortic atherosclerosis. Hepatobiliary: No worrisome focal liver lesions. Smooth liver surface contour. Normal hepatic attenuation.  Gallbladder is within normal limits for physiologic distension. No visible calcified gallstone or biliary ductal dilatation. Pancreas: No pancreatic ductal dilatation or surrounding inflammatory changes. Spleen: Normal in size. No concerning splenic lesions. Some mild stranding along the posterior spleen is likely redistributed from adjacent colonic process. Adrenals/Urinary Tract: Normal adrenal glands. Kidneys are normally located with symmetric enhancementand excretion. Stable areas cortical lobulation, possibly scarring. No suspicious renal lesion, urolithiasis or hydronephrosis. Circumferential bladder wall thickening, may be partly reactive. Stomach/Bowel: Distal esophagus, stomach and duodenum are unremarkable. No small bowel thickening or dilatation. Much of the proximal colon is fluid-filled. There is some heterogeneous circumferential mural thickening in the ascending colon (5/49) possibly related to normal peristaltic contraction though should be further evaluated on an outpatient basis. More concerning is a long segment of colonic mural thickening extending from the splenic flexure to the rectosigmoid junction which is within the IMA vascular distribution. Extensive surrounding pericolonic inflammatory changes are present particularly about the distal sigmoid with edematous features of the mesentery and small volume free fluid in the adjacent leaflets. A larger rectal stool ball is noted as well with at most minimal mural thickening at the level of the rectal vault. Vascular/Lymphatic: Extensive atherosclerotic calcification throughout the abdominal aorta and branch vessels. This includes high-grade ostial narrowing and likely occlusion of the IMA origin with some residual opacification of the proximal IMA supplied via back fill and colic collaterals as well as additional high-grade narrowing and a hypodense filling defect seen within the proximal SMA origin which given concomitant occlusion of the IMA  origin elsewhere could feasibly result in vascular compromise of the distal colon. No aneurysm or ectasia. No major venous abnormalities. Some reactive adenopathy in the mesentery. No pathologically enlarged nodes in the abdomen or pelvis. Reproductive: Borderline prostatomegaly.  No focal prostate lesions. Other: Inflammatory changes centered upon the thickened distal colon and more focally in the low pelvis upon the sigmoid. Some mild pre sacral fat stranding as well possibly related to the rectal stool ball/impaction. Some perivesicular hazy stranding as well. No abdominopelvic free air. No organized collection or abscess. No bowel containing hernias. Musculoskeletal: The osseous structures appear diffusely demineralized which may limit detection of small or nondisplaced fractures. No acute osseous abnormality or suspicious osseous lesion. Multilevel degenerative changes are present in the imaged portions of the spine. Features most pronounced L4-5. Additional degenerative changes in the hips and pelvis. IMPRESSION: 1. Long segment of colonic mural thickening extending from the splenic flexure to the rectosigmoid junction which is within the IMA vascular distribution. Extensive surrounding pericolonic inflammatory changes are present particularly about the distal sigmoid with edematous features of the mesentery and small volume free fluid in the adjacent leaflets. Given a hypodense and possibly acute occlusive filling defect in the severely narrowed SMA origin and suspected ostial occlusion of the IMA, appearance is is worrisome for an acute ischemic colitis. Infectious or inflammatory colitis is possible though less favored. 2. Rectal stool ball with some  additional mild rectal wall thickening could reflect impaction or mild stercoral colitis as well. 3. Nonspecific thickening seen in the distal ascending colon. Possibly peristaltic though should consider correlation with outpatient colonoscopy to exclude  underlying lesion. 4. Circumferential bladder wall thickening, possibly related to outlet obstruction with borderline prostatomegaly though could correlate with urinary symptoms and urinalysis to exclude superimposed cystitis. 5. Aortic Atherosclerosis (ICD10-I70.0). Critical Value/emergent results were called by telephone at the time of interpretation on 09/10/2020 at 3:21 am to provider [facility] , who verbally acknowledged these results. Electronically Signed   By: Lovena Le M.D.   On: 09/10/2020 03:22   DG Chest Port 1 View  Result Date: 09/10/2020 CLINICAL DATA:  Abdominal pain EXAM: PORTABLE CHEST 1 VIEW COMPARISON:  04/04/2019 FINDINGS: Left pacer remains in place, unchanged. Heart and mediastinal contours are within normal limits. No focal opacities or effusions. No acute bony abnormality. Aortic atherosclerosis. IMPRESSION: No active cardiopulmonary disease. Electronically Signed   By: Rolm Baptise M.D.   On: 09/10/2020 02:57   EEG adult  Result Date: 08/16/2020 Lora Havens, MD     08/16/2020 11:15 AM Patient Name: Francisco [name] MRN: [id number] Epilepsy Attending: Lora Havens Referring Physician/Provider: Dr. Jacki Cones Date: 08/16/2020 Duration: 24.30 minutes Patient history: 74 year old male with right parieto-occipital craniotomy and tumor resection, epilepsy who presented with breakthrough seizure.  EEG to evaluate for seizure. Level of alertness: Awake AEDs during EEG study: Keppra Technical aspects: This EEG study was done with scalp electrodes positioned according to the 10-20 International system of electrode placement. Electrical activity was acquired at a sampling rate of 500Hz  and reviewed with a high frequency filter of 70Hz  and a low frequency filter of 1Hz . EEG data were recorded continuously and digitally stored. Description: The posterior dominant rhythm consists of 9 Hz activity of moderate voltage (25-35 uV) seen predominantly in posterior head  regions, asymmetric ( R<L) and reactive to eye opening and eye closing.  EEG showed continuous 3 to 5 Hz theta-delta slowing in right parieto-occipital region.  Single sharp transient was seen in right occipital region.  Physiologic photic driving was not seen during photic stimulation.  Hyperventilation was not performed.   ABNORMALITY -Continuous slow, right parieto-occipital region -Background asymmetry, right less than left IMPRESSION: This study is suggestive of cortical dysfunction in right parieto-occipital region consistent with underlying craniotomy.  No seizures or definite epileptiform discharges were seen throughout the recording. [place], DO  [facility]  If 7PM-7AM, please contact night-coverage [email or web address] Password [email or web address] 09/12/2020, 5:12 PM   LOS: 2 days

## 2020-09-12 NOTE — Progress Notes (Signed)
Patient more animated today.  Says he is hungry.  Wants more to eat. Says only "a little" left lower abdominal pain.   Diet not advanced as requested yesterday.   Vital signs in last 24 hours: Temp:  [97.7 F (36.5 C)-97.8 F (36.6 C)] 97.7 F (36.5 C) (11/07 0511) Pulse Rate:  [60-81] 60 (11/07 0511) Resp:  [18] 18 (11/07 0511) BP: (140-156)/(74-83) 140/74 (11/07 0511) SpO2:  [97 %-98 %] 97 % (11/07 0511) Last BM Date: 09/11/20 General: Awake, conversant.  Appears comfortable.  Seen with nursing staff. Abdomen: Abdomen is nondistended.  Positive bowel sounds entirely soft and nontender without appreciable mass organomegaly extremities:  Without clubbing or edema.    Rectal: Turned patient-lying in a large amount of pasty green stool.  DRE by me reveals good sphincter tone.  No impaction or mass in the rectal vault.  Scant pasty green stool  Intake/Output from previous day: 11/06 0701 - 11/07 0700 In: 2010 [I.V.:1905.7; IV Piggyback:104.2] Out: -  Intake/Output this shift: Total I/O In: 240 [P.O.:240] Out: 450 [Urine:450]  Lab Results: Recent Labs    09/10/20 0124 09/11/20 0634 09/12/20 0535  WBC 36.3* 14.9* 10.4  HGB 10.8* 9.3* 9.7*  HCT 33.0* 28.7* 29.6*  PLT 336 302 297   BMET Recent Labs    09/10/20 0124 09/11/20 0634 09/12/20 0535  NA 137 140 138  K 3.9 3.4* 3.0*  CL 100 104 103  CO2 28 27 26   GLUCOSE 156* 78 73  BUN 26* 22 14  CREATININE 1.27* 0.90 0.82  CALCIUM 8.7* 8.4* 8.5*   LFT Recent Labs    09/12/20 0535  PROT 5.4*  ALBUMIN 2.7*  AST 24  ALT 13  ALKPHOS 69  BILITOT 0.6   PT/INR No results for input(s): LABPROT, INR in the last 72 hours. Hepatitis Panel No results for input(s): HEPBSAG, HCVAB, HEPAIGM, HEPBIGM in the last 72 hours. C-Diff No results for input(s): CDIFFTOX in the last 72 hours.  Studies/Results: No results found.  Impression: Clinically improved overnight.  Likely decompressed his rectum and distal colon  spontaneously this morning.  C. difficile/GIP remain pending  White count now normal.  Benign abdominal examination is reassuring as is the absence of gross blood per rectum..  No need for endoscopic evaluation or further vascular imaging at this time.    Recommendations:  Heart healthy, gluten-free diet for him today follow-up on pending stool studies.  Management of hypokalemia per attending.      Assessment: Active Problems:   Merkel cell carcinoma of neck (HCC)   Seizure with provoking factor (HCC)   Seizure disorder (HCC)   Mixed hyperlipidemia due to type 2 diabetes mellitus ([place])   Ischemic colitis ([place])    Plan:   LOS: 2 days   Manus Rudd  09/12/2020, 11:45 AM

## 2020-09-12 NOTE — Plan of Care (Signed)
  Problem: Education: Goal: Knowledge of General Education information will improve Description Including pain rating scale, medication(s)/side effects and non-pharmacologic comfort measures Outcome: Progressing   Problem: Health Behavior/Discharge Planning: Goal: Ability to manage health-related needs will improve Outcome: Progressing   

## 2020-09-12 NOTE — Discharge Summary (Signed)
Physician Discharge Summary  [name]:[id number] DOB: 09-04-1946 DOA: 09/10/2020  PCP: Lesia Hausen, PA  Admit date: 09/10/2020 Discharge date: 09/14/2020  Admitted From: SNF Disposition:  SNF  Recommendations for Outpatient Follow-up:  1. Follow up with PCP in 1-2 weeks 2. Please obtain BMP/CBC in one week 3. 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 Unfortunately, the patient is a poor historian and much of this history is obtained from review of the medical record. 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 He was discharged to a skilled nursing facility,[name].There is no reports of nausea, vomiting, chest pain, shortness breath. He did have some loose stools. In the emergency department, the patient was afebrile hemodynamically stable with oxygen saturation 97% on room air. WBC 36.3, hemoglobin 10.8, platelets 336,000. Sodium 137, potassium 3.9, serum creatinine 1.27. LFTs were unremarkable. Lipase 17. CT of the abdomen and pelvis showed a long segment colonic mural thickening at the splenic flexure to the rectosigmoid junction and the IMA distribution. There is extensive pericolonic inflammatory changes about the distal and sigmoid colon with edematous features.  GI and general surgery were consulted.  They both recommended medical  therapy as the patient was a poor surgical candidate and poor candidate for invasive intervention.  The patient gradually improved with bowel rest, IVF, and abx.   He abdominal pain improved as did his WBC.  His diet was advanced which he tolerated.  Discharge Diagnoses:  Ischemic colitis/Pancolitis -ContinueIV fluids -General surgery consult-->poor surgical candidate -ContinueIV Zosyn>>d/c with cipro/flagyl x 7 more days -appreciate GI -advanced diet to cardiac diet which pt tolerated  CKD stage IIIa -Baseline creatinine 0.9-1.2 -Continue IV fluids  Seizure disorder -Continue Keppra  Chronic diastolic CHF -Patient is clinically euvolemic -07/01/2015 echo EF 55 to 60%, grade 1 DD  Diabetes mellitus type 2 -09/11/20 A1C--6.1 -d/c ISS  Metastatic Merkel cell cancer -From medical record it does not appear the patient is a candidate for further radiation therapy -Status post parietal lobe resection  Hyperlipidemia -Continue statin  Hypokalemia -replete -mag 1.8  Goals of Care -discussed with sister, HPOA--she would not want any aggressive intervention -she is agreeable to medical therapy to see if patient continues to improve -if no improvement or worsens-->comfort care -confirmed DNR   Discharge Instructions   Allergies as of 09/14/2020   No Known Allergies     Medication List    STOP taking these medications   clotrimazole-betamethasone cream Commonly known as: LOTRISONE   dexamethasone 1 MG tablet Commonly known as: DECADRON   loperamide 2 MG tablet Commonly known as: IMODIUM A-D   midodrine 10 MG tablet Commonly known as: PROAMATINE     TAKE these medications   allopurinol 300 MG tablet Commonly known as: ZYLOPRIM TAKE 1 TABLET(300 MG) BY MOUTH DAILY What changed: See the new instructions.   atorvastatin 20 MG tablet Commonly known as: LIPITOR Take 1 tablet (20 mg total) by mouth daily.   ciprofloxacin 500 MG tablet Commonly known  as: CIPRO Take 1 tablet (500 mg total) by mouth 2 (two) times daily. X  7 days   colchicine 0.6 MG tablet Take 1 tablet (0.6 mg total) by mouth 2 (two) times daily as needed (gout flare). Take twice daily for 2 weeks, then use as needed for gout flare up   guaiFENesin 600 MG 12 hr tablet Commonly known as: MUCINEX Take 600-1,200 mg by mouth every 12 (twelve) hours as needed for cough.   guaiFENesin 100 MG/5ML liquid Commonly known as: ROBITUSSIN Take 200 mg by mouth every 6 (six) hours as needed for cough.   levETIRAcetam 750 MG tablet Commonly known as: KEPPRA Take 1 tablet (750 mg total) by mouth 2 (two) times daily.   lidocaine 2 % solution Commonly known as: XYLOCAINE Patient: Mix 1part 2% viscous lidocaine, 1part H20. Swish & swallow 13m of diluted mixture, 229m before meals and at bedtime, up to QID   LORazepam 0.5 MG tablet Commonly known as: ATIVAN Take 1 tablet (0.5 mg total) by mouth every 8 (eight) hours as needed. What changed: reasons to take this   magnesium hydroxide 400 MG/5ML suspension Commonly known as: MILK OF MAGNESIA Take 30 mLs by mouth at bedtime as needed for mild constipation.   metroNIDAZOLE 500 MG tablet Commonly known as: FLAGYL Take 1 tablet (500 mg total) by mouth every 8 (eight) hours. X 7 days   naproxen sodium 220 MG tablet Commonly known as: ALEVE Take 220 mg by mouth every 12 (twelve) hours as needed (joint pain).   omeprazole 20 MG capsule Commonly known as: PRILOSEC Take 20 mg by mouth daily.   ondansetron 4 MG tablet Commonly known as: ZOFRAN Take 1 tablet (4 mg total) by mouth every 4 (four) hours as needed for nausea or vomiting.   tamsulosin 0.4 MG Caps capsule Commonly known as: FLOMAX TAKE 1 CAPSULE(0.4 MG) BY MOUTH DAILY What changed: See the new instructions.   vitamin B-12 500 MCG tablet Commonly known as: CYANOCOBALAMIN Take 500 mcg by mouth daily.       No Known Allergies  Consultations:  General  surgery GI   Procedures/Studies: CT ABDOMEN PELVIS W CONTRAST  Result Date: 09/10/2020 CLINICAL DATA:  Severe abdominal pain and diarrhea EXAM: CT ABDOMEN AND PELVIS WITH CONTRAST TECHNIQUE: Multidetector CT imaging of the abdomen and pelvis was performed using the standard protocol following bolus administration of intravenous contrast. CONTRAST:  10070mMNIPAQUE IOHEXOL 300 MG/ML  SOLN COMPARISON:  CT 08/03/2020 FINDINGS: Lower chest: Some dependent atelectatic changes in the most clear lung bases. Cardiac size within normal limits. No pericardial effusion. Pacer leads terminate at the cardiac apex and right atrium. Extensive coronary artery atherosclerosis. Dense calcification of mitral annulus and aortic leaflets as well. Distal thoracic aortic atherosclerosis. Hepatobiliary: No worrisome focal liver lesions. Smooth liver surface contour. Normal hepatic attenuation. Gallbladder is within normal limits for physiologic distension. No visible calcified gallstone or biliary ductal dilatation. Pancreas: No pancreatic ductal dilatation or surrounding inflammatory changes. Spleen: Normal in size. No concerning splenic lesions. Some mild stranding along the posterior spleen is likely redistributed from adjacent colonic process. Adrenals/Urinary Tract: Normal adrenal glands. Kidneys are normally located with symmetric enhancementand excretion. Stable areas cortical lobulation, possibly scarring. No suspicious renal lesion, urolithiasis or hydronephrosis. Circumferential bladder wall thickening, may be partly reactive. Stomach/Bowel: Distal esophagus, stomach and duodenum are unremarkable. No small bowel thickening or dilatation. Much of the proximal colon is fluid-filled. There is some heterogeneous circumferential mural thickening in the ascending colon (5/49) possibly related to normal peristaltic contraction though should be further evaluated on an outpatient basis. More  concerning is a long segment of colonic  mural thickening extending from the splenic flexure to the rectosigmoid junction which is within the IMA vascular distribution. Extensive surrounding pericolonic inflammatory changes are present particularly about the distal sigmoid with edematous features of the mesentery and small volume free fluid in the adjacent leaflets. A larger rectal stool ball is noted as well with at most minimal mural thickening at the level of the rectal vault. Vascular/Lymphatic: Extensive atherosclerotic calcification throughout the abdominal aorta and branch vessels. This includes high-grade ostial narrowing and likely occlusion of the IMA origin with some residual opacification of the proximal IMA supplied via back fill and colic collaterals as well as additional high-grade narrowing and a hypodense filling defect seen within the proximal SMA origin which given concomitant occlusion of the IMA origin elsewhere could feasibly result in vascular compromise of the distal colon. No aneurysm or ectasia. No major venous abnormalities. Some reactive adenopathy in the mesentery. No pathologically enlarged nodes in the abdomen or pelvis. Reproductive: Borderline prostatomegaly.  No focal prostate lesions. Other: Inflammatory changes centered upon the thickened distal colon and more focally in the low pelvis upon the sigmoid. Some mild pre sacral fat stranding as well possibly related to the rectal stool ball/impaction. Some perivesicular hazy stranding as well. No abdominopelvic free air. No organized collection or abscess. No bowel containing hernias. Musculoskeletal: The osseous structures appear diffusely demineralized which may limit detection of small or nondisplaced fractures. No acute osseous abnormality or suspicious osseous lesion. Multilevel degenerative changes are present in the imaged portions of the spine. Features most pronounced L4-5. Additional degenerative changes in the hips and pelvis. IMPRESSION: 1. Long segment of  colonic mural thickening extending from the splenic flexure to the rectosigmoid junction which is within the IMA vascular distribution. Extensive surrounding pericolonic inflammatory changes are present particularly about the distal sigmoid with edematous features of the mesentery and small volume free fluid in the adjacent leaflets. Given a hypodense and possibly acute occlusive filling defect in the severely narrowed SMA origin and suspected ostial occlusion of the IMA, appearance is is worrisome for an acute ischemic colitis. Infectious or inflammatory colitis is possible though less favored. 2. Rectal stool ball with some additional mild rectal wall thickening could reflect impaction or mild stercoral colitis as well. 3. Nonspecific thickening seen in the distal ascending colon. Possibly peristaltic though should consider correlation with outpatient colonoscopy to exclude underlying lesion. 4. Circumferential bladder wall thickening, possibly related to outlet obstruction with borderline prostatomegaly though could correlate with urinary symptoms and urinalysis to exclude superimposed cystitis. 5. Aortic Atherosclerosis (ICD10-I70.0). Critical Value/emergent results were called by telephone at the time of interpretation on 09/10/2020 at 3:21 am to provider [facility] , who verbally acknowledged these results. Electronically Signed   By: Lovena Le M.D.   On: 09/10/2020 03:22   DG Chest Port 1 View  Result Date: 09/10/2020 CLINICAL DATA:  Abdominal pain EXAM: PORTABLE CHEST 1 VIEW COMPARISON:  04/04/2019 FINDINGS: Left pacer remains in place, unchanged. Heart and mediastinal contours are within normal limits. No focal opacities or effusions. No acute bony abnormality. Aortic atherosclerosis. IMPRESSION: No active cardiopulmonary disease. Electronically Signed   By: Rolm Baptise M.D.   On: 09/10/2020 02:57   EEG adult  Result Date: 08/16/2020 Lora Havens, MD     08/16/2020 11:15 AM Patient  Name: CALEEL KINER MRN: [id number] Epilepsy Attending: Lora Havens Referring Physician/Provider: Dr. Jacki Cones Date: 08/16/2020 Duration: 24.30 minutes Patient history: 74 year old male  with right parieto-occipital craniotomy and tumor resection, epilepsy who presented with breakthrough seizure.  EEG to evaluate for seizure. Level of alertness: Awake AEDs during EEG study: Keppra Technical aspects: This EEG study was done with scalp electrodes positioned according to the 10-20 International system of electrode placement. Electrical activity was acquired at a sampling rate of 500Hz  and reviewed with a high frequency filter of 70Hz  and a low frequency filter of 1Hz . EEG data were recorded continuously and digitally stored. Description: The posterior dominant rhythm consists of 9 Hz activity of moderate voltage (25-35 uV) seen predominantly in posterior head regions, asymmetric ( R<L) and reactive to eye opening and eye closing.  EEG showed continuous 3 to 5 Hz theta-delta slowing in right parieto-occipital region.  Single sharp transient was seen in right occipital region.  Physiologic photic driving was not seen during photic stimulation.  Hyperventilation was not performed.   ABNORMALITY -Continuous slow, right parieto-occipital region -Background asymmetry, right less than left IMPRESSION: This study is suggestive of cortical dysfunction in right parieto-occipital region consistent with underlying craniotomy.  No seizures or definite epileptiform discharges were seen throughout the recording. Lora Havens        Discharge Exam: Vitals:   09/13/20 1500 09/14/20 0649  BP: (!) 144/69 (!) 129/59  Pulse: 67 65  Resp: 18 20  Temp: 98 F (36.7 C) 98.4 F (36.9 C)  SpO2: 96% 95%   Vitals:   09/12/20 2101 09/13/20 0526 09/13/20 1500 09/14/20 0649  BP: 127/62 (!) 167/77 (!) 144/69 (!) 129/59  Pulse: 69 63 67 65  Resp: 17 20 18 20   Temp: 98 F (36.7 C) 98.2 F (36.8 C) 98 F (36.7  C) 98.4 F (36.9 C)  TempSrc: Oral  Oral Oral  SpO2: 97% 99% 96% 95%  Weight:      Height:        General: Pt is alert, awake, not in acute distress Cardiovascular: RRR, S1/S2 +, no rubs, no gallops Respiratory: bibasilar rales. No wheeze Abdominal: Soft, NT, ND, bowel sounds + Extremities: no edema, no cyanosis   The results of significant diagnostics from this hospitalization (including imaging, microbiology, ancillary and laboratory) are listed below for reference.    Significant Diagnostic Studies: CT ABDOMEN PELVIS W CONTRAST  Result Date: 09/10/2020 CLINICAL DATA:  Severe abdominal pain and diarrhea EXAM: CT ABDOMEN AND PELVIS WITH CONTRAST TECHNIQUE: Multidetector CT imaging of the abdomen and pelvis was performed using the standard protocol following bolus administration of intravenous contrast. CONTRAST:  132m OMNIPAQUE IOHEXOL 300 MG/ML  SOLN COMPARISON:  CT 08/03/2020 FINDINGS: Lower chest: Some dependent atelectatic changes in the most clear lung bases. Cardiac size within normal limits. No pericardial effusion. Pacer leads terminate at the cardiac apex and right atrium. Extensive coronary artery atherosclerosis. Dense calcification of mitral annulus and aortic leaflets as well. Distal thoracic aortic atherosclerosis. Hepatobiliary: No worrisome focal liver lesions. Smooth liver surface contour. Normal hepatic attenuation. Gallbladder is within normal limits for physiologic distension. No visible calcified gallstone or biliary ductal dilatation. Pancreas: No pancreatic ductal dilatation or surrounding inflammatory changes. Spleen: Normal in size. No concerning splenic lesions. Some mild stranding along the posterior spleen is likely redistributed from adjacent colonic process. Adrenals/Urinary Tract: Normal adrenal glands. Kidneys are normally located with symmetric enhancementand excretion. Stable areas cortical lobulation, possibly scarring. No suspicious renal lesion,  urolithiasis or hydronephrosis. Circumferential bladder wall thickening, may be partly reactive. Stomach/Bowel: Distal esophagus, stomach and duodenum are unremarkable. No small bowel thickening or dilatation. Much  of the proximal colon is fluid-filled. There is some heterogeneous circumferential mural thickening in the ascending colon (5/49) possibly related to normal peristaltic contraction though should be further evaluated on an outpatient basis. More concerning is a long segment of colonic mural thickening extending from the splenic flexure to the rectosigmoid junction which is within the IMA vascular distribution. Extensive surrounding pericolonic inflammatory changes are present particularly about the distal sigmoid with edematous features of the mesentery and small volume free fluid in the adjacent leaflets. A larger rectal stool ball is noted as well with at most minimal mural thickening at the level of the rectal vault. Vascular/Lymphatic: Extensive atherosclerotic calcification throughout the abdominal aorta and branch vessels. This includes high-grade ostial narrowing and likely occlusion of the IMA origin with some residual opacification of the proximal IMA supplied via back fill and colic collaterals as well as additional high-grade narrowing and a hypodense filling defect seen within the proximal SMA origin which given concomitant occlusion of the IMA origin elsewhere could feasibly result in vascular compromise of the distal colon. No aneurysm or ectasia. No major venous abnormalities. Some reactive adenopathy in the mesentery. No pathologically enlarged nodes in the abdomen or pelvis. Reproductive: Borderline prostatomegaly.  No focal prostate lesions. Other: Inflammatory changes centered upon the thickened distal colon and more focally in the low pelvis upon the sigmoid. Some mild pre sacral fat stranding as well possibly related to the rectal stool ball/impaction. Some perivesicular hazy stranding  as well. No abdominopelvic free air. No organized collection or abscess. No bowel containing hernias. Musculoskeletal: The osseous structures appear diffusely demineralized which may limit detection of small or nondisplaced fractures. No acute osseous abnormality or suspicious osseous lesion. Multilevel degenerative changes are present in the imaged portions of the spine. Features most pronounced L4-5. Additional degenerative changes in the hips and pelvis. IMPRESSION: 1. Long segment of colonic mural thickening extending from the splenic flexure to the rectosigmoid junction which is within the IMA vascular distribution. Extensive surrounding pericolonic inflammatory changes are present particularly about the distal sigmoid with edematous features of the mesentery and small volume free fluid in the adjacent leaflets. Given a hypodense and possibly acute occlusive filling defect in the severely narrowed SMA origin and suspected ostial occlusion of the IMA, appearance is is worrisome for an acute ischemic colitis. Infectious or inflammatory colitis is possible though less favored. 2. Rectal stool ball with some additional mild rectal wall thickening could reflect impaction or mild stercoral colitis as well. 3. Nonspecific thickening seen in the distal ascending colon. Possibly peristaltic though should consider correlation with outpatient colonoscopy to exclude underlying lesion. 4. Circumferential bladder wall thickening, possibly related to outlet obstruction with borderline prostatomegaly though could correlate with urinary symptoms and urinalysis to exclude superimposed cystitis. 5. Aortic Atherosclerosis (ICD10-I70.0). Critical Value/emergent results were called by telephone at the time of interpretation on 09/10/2020 at 3:21 am to provider [facility] , who verbally acknowledged these results. Electronically Signed   By: Lovena Le M.D.   On: 09/10/2020 03:22   DG Chest Port 1 View  Result Date:  09/10/2020 CLINICAL DATA:  Abdominal pain EXAM: PORTABLE CHEST 1 VIEW COMPARISON:  04/04/2019 FINDINGS: Left pacer remains in place, unchanged. Heart and mediastinal contours are within normal limits. No focal opacities or effusions. No acute bony abnormality. Aortic atherosclerosis. IMPRESSION: No active cardiopulmonary disease. Electronically Signed   By: Rolm Baptise M.D.   On: 09/10/2020 02:57   EEG adult  Result Date: 08/16/2020 Lora Havens, MD  08/16/2020 11:15 AM Patient Name: Francisco [name] MRN: [id number] Epilepsy Attending: Lora Havens Referring Physician/Provider: Dr. Jacki Cones Date: 08/16/2020 Duration: 24.30 minutes Patient history: 74 year old male with right parieto-occipital craniotomy and tumor resection, epilepsy who presented with breakthrough seizure.  EEG to evaluate for seizure. Level of alertness: Awake AEDs during EEG study: Keppra Technical aspects: This EEG study was done with scalp electrodes positioned according to the 10-20 International system of electrode placement. Electrical activity was acquired at a sampling rate of 500Hz  and reviewed with a high frequency filter of 70Hz  and a low frequency filter of 1Hz . EEG data were recorded continuously and digitally stored. Description: The posterior dominant rhythm consists of 9 Hz activity of moderate voltage (25-35 uV) seen predominantly in posterior head regions, asymmetric ( R<L) and reactive to eye opening and eye closing.  EEG showed continuous 3 to 5 Hz theta-delta slowing in right parieto-occipital region.  Single sharp transient was seen in right occipital region.  Physiologic photic driving was not seen during photic stimulation.  Hyperventilation was not performed.   ABNORMALITY -Continuous slow, right parieto-occipital region -Background asymmetry, right less than left IMPRESSION: This study is suggestive of cortical dysfunction in right parieto-occipital region consistent with underlying craniotomy.   No seizures or definite epileptiform discharges were seen throughout the recording. Lora Havens     Microbiology: Recent Results (from the past 240 hour(s))  Respiratory Panel by RT PCR (Flu A&B, Covid) - Nasopharyngeal Swab     Status: None   Collection Time: 09/10/20  1:31 AM   Specimen: Nasopharyngeal Swab  Result Value Ref Range Status   SARS Coronavirus 2 by RT PCR NEGATIVE NEGATIVE Final    Comment: (NOTE) SARS-CoV-2 target nucleic acids are NOT DETECTED.  The SARS-CoV-2 RNA is generally detectable in upper respiratoy specimens during the acute phase of infection. The lowest concentration of SARS-CoV-2 viral copies this assay can detect is 131 copies/mL. A negative result does not preclude SARS-Cov-2 infection and should not be used as the sole basis for treatment or other patient management decisions. A negative result may occur with  improper specimen collection/handling, submission of specimen other than nasopharyngeal swab, presence of viral mutation(s) within the areas targeted by this assay, and inadequate number of viral copies (<131 copies/mL). A negative result must be combined with clinical observations, patient history, and epidemiological information. The expected result is Negative.  Fact Sheet for Patients:  PinkCheek.be  Fact Sheet for Healthcare Providers:  GravelBags.it  This test is no t yet approved or cleared by the [place] FDA and  has been authorized for detection and/or diagnosis of SARS-CoV-2 by FDA under an Emergency Use Authorization (EUA). This EUA will remain  in effect (meaning this test can be used) for the duration of the COVID-19 declaration under Section 564(b)(1) of the Act, 21 U.S.C. section 360bbb-3(b)(1), unless the authorization is terminated or revoked sooner.     Influenza A by PCR NEGATIVE NEGATIVE Final   Influenza B by PCR NEGATIVE NEGATIVE Final    Comment:  (NOTE) The Xpert Xpress SARS-CoV-2/FLU/RSV assay is intended as an aid in  the diagnosis of influenza from Nasopharyngeal swab specimens and  should not be used as a sole basis for treatment. Nasal washings and  aspirates are unacceptable for Xpert Xpress SARS-CoV-2/FLU/RSV  testing.  Fact Sheet for Patients: PinkCheek.be  Fact Sheet for Healthcare Providers: GravelBags.it  This test is not yet approved or cleared by the [place] and  has been authorized  for detection and/or diagnosis of SARS-CoV-2 by  FDA under an Emergency Use Authorization (EUA). This EUA will remain  in effect (meaning this test can be used) for the duration of the  Covid-19 declaration under Section 564(b)(1) of the Act, 21  U.S.C. section 360bbb-3(b)(1), unless the authorization is  terminated or revoked. Performed at [facility], [address], [place], [place] [address]      Labs: Basic Metabolic Panel: Recent Labs  Lab 09/10/20 0124 09/10/20 0124 09/11/20 0634 09/11/20 0634 09/12/20 0535 09/13/20 1343  NA 137  --  140  --  138 137  K 3.9   < > 3.4*   < > 3.0* 3.7  CL 100  --  104  --  103 102  CO2 28  --  27  --  26 27  GLUCOSE 156*  --  78  --  73 87  BUN 26*  --  22  --  14 8  CREATININE 1.27*  --  0.90  --  0.82 0.78  CALCIUM 8.7*  --  8.4*  --  8.5* 8.4*  MG  --   --   --   --  1.8  --    < > = values in this interval not displayed.   Liver Function Tests: Recent Labs  Lab 09/10/20 0124 09/11/20 0634 09/12/20 0535 09/13/20 1343  AST 22 24 24 24   ALT 15 13 13 12   ALKPHOS 85 74 69 66  BILITOT 0.6 0.7 0.6 0.6  PROT 5.9* 5.5* 5.4* 5.7*  ALBUMIN 3.0* 2.6* 2.7* 2.7*   Recent Labs  Lab 09/10/20 0124  LIPASE 17   No results for input(s): AMMONIA in the last 168 hours. CBC: Recent Labs  Lab 09/10/20 0124 09/11/20 0634 09/12/20 0535 09/13/20 1343  WBC 36.3* 14.9* 10.4 7.6  NEUTROABS 34.2*  --   --   --   HGB  10.8* 9.3* 9.7* 9.7*  HCT 33.0* 28.7* 29.6* 29.6*  MCV 95.7 93.2 92.2 91.9  PLT 336 302 297 323   Cardiac Enzymes: No results for input(s): CKTOTAL, CKMB, CKMBINDEX, TROPONINI in the last 168 hours. BNP: Invalid input(s): POCBNP CBG: No results for input(s): GLUCAP in the last 168 hours.  Time coordinating discharge:  36 minutes  Signed:  Orson Eva, DO [facility] Pager: [phone] 09/14/2020, 9:17 AM

## 2020-09-13 DIAGNOSIS — K559 Vascular disorder of intestine, unspecified: Secondary | ICD-10-CM | POA: Diagnosis not present

## 2020-09-13 LAB — CBC
HCT: 29.6 % — ABNORMAL LOW (ref 39.0–52.0)
Hemoglobin: 9.7 g/dL — ABNORMAL LOW (ref 13.0–17.0)
MCH: 30.1 pg (ref 26.0–34.0)
MCHC: 32.8 g/dL (ref 30.0–36.0)
MCV: 91.9 fL (ref 80.0–100.0)
Platelets: 323 10*3/uL (ref 150–400)
RBC: 3.22 MIL/uL — ABNORMAL LOW (ref 4.22–5.81)
RDW: 15.7 % — ABNORMAL HIGH (ref 11.5–15.5)
WBC: 7.6 10*3/uL (ref 4.0–10.5)
nRBC: 0 % (ref 0.0–0.2)

## 2020-09-13 LAB — COMPREHENSIVE METABOLIC PANEL
ALT: 12 U/L (ref 0–44)
AST: 24 U/L (ref 15–41)
Albumin: 2.7 g/dL — ABNORMAL LOW (ref 3.5–5.0)
Alkaline Phosphatase: 66 U/L (ref 38–126)
Anion gap: 8 (ref 5–15)
BUN: 8 mg/dL (ref 8–23)
CO2: 27 mmol/L (ref 22–32)
Calcium: 8.4 mg/dL — ABNORMAL LOW (ref 8.9–10.3)
Chloride: 102 mmol/L (ref 98–111)
Creatinine, Ser: 0.78 mg/dL (ref 0.61–1.24)
GFR, Estimated: >60 mL/min (ref >60)
Glucose, Bld: 87 mg/dL (ref 70–99)
Potassium: 3.7 mmol/L (ref 3.5–5.1)
Sodium: 137 mmol/L (ref 135–145)
Total Bilirubin: 0.6 mg/dL (ref 0.3–1.2)
Total Protein: 5.7 g/dL — ABNORMAL LOW (ref 6.5–8.1)

## 2020-09-13 MED ORDER — BOOST / RESOURCE BREEZE PO LIQD CUSTOM
1.0000 | Freq: Three times a day (TID) | ORAL | Status: DC
  Administered 2020-09-13 – 2020-09-14 (×3): 1 via ORAL

## 2020-09-13 MED ORDER — OCUVITE-LUTEIN PO CAPS
1.0000 | ORAL_CAPSULE | Freq: Every day | ORAL | Status: DC
  Administered 2020-09-13 – 2020-09-14 (×2): 1 via ORAL
  Filled 2020-09-13 (×2): qty 1

## 2020-09-13 NOTE — NC FL2 (Signed)
[place] LEVEL OF CARE SCREENING TOOL     IDENTIFICATION  Patient Name: Francisco [name] Birthdate: 1945-12-18 Sex: male Admission Date (Current Location): 09/10/2020  [place] and [place] Number:  [place] [facility] [id number] [place] and Address:  [facility] [address] [place]      Provider Number: [id number]  Attending Physician Name and Address:  Orson Eva, MD  Relative Name and Phone Number:       Current Level of Care: Hospital Recommended Level of Care: [place] Prior Approval Number:    Date Approved/Denied:   PASRR Number:    Discharge Plan: SNF    Current Diagnoses: Patient Active Problem List   Diagnosis Date Noted  . Ischemic colitis ([place]) 09/10/2020  . Generalized abdominal pain   . Diarrhea   . Acute metabolic encephalopathy   . Brain metastasis ([place]) 08/18/2020  . Neoplasm of brain causing mass effect on adjacent structures ([place]) 08/03/2020  . Seizure disorder ([place]) 08/03/2020  . Mixed hyperlipidemia due to type 2 diabetes mellitus ([place]) 08/03/2020  . Type 2 diabetes mellitus without complication, with long-term current use of insulin ([place]) 08/03/2020  . Bilateral carotid artery stenosis 08/03/2020  . Seizure with provoking factor ([place]) 08/02/2020  . 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 Parotid mass 08/21/2018  . Carotid disease, bilateral ([place]) 03/20/2018  . Hyponatremia 12/02/2017  . Falls 12/01/2017  . History of alcohol abuse   . Chest pain 08/14/2016  . Seizure ([place])   . Gout 12/17/2015  . Convulsions ([place]) 08/17/2015  . Gait instability 08/17/2015  . Alcoholism /alcohol abuse 08/17/2015  . Cardiac device in situ, other   . Elevated troponin   . Cardiac arrest ([place])   . Complete heart block ([place])   . Seizures ([place])   . Chronic diastolic CHF (congestive heart failure) ([place])   . HLD (hyperlipidemia)   . Alcohol withdrawal  ([place])   . Acute renal failure syndrome ([place])   . Hypokalemia   . Hypomagnesemia   . Absolute anemia   . Hypertrophic cardiomyopathy ([place])   . Diastolic CHF, acute on chronic (HCC)   . Essential hypertension   . Alcohol withdrawal seizure ([place]) 06/29/2015  . 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 Mild diastolic dysfunction 42/68/3419  . Acute pain of right knee 06/29/2015  . Transaminitis 06/29/2015  . Acute renal failure ([place]) 06/29/2015  . High anion gap metabolic acidosis 62/22/9798  . Acute hypokalemia 06/29/2015  . Alcohol abuse, daily use 06/10/2015  . Hyperglycemia 06/09/2015  . Vitamin D deficiency 06/09/2015  . Hyperlipidemia 06/09/2015  . Bilateral lower extremity edema (chronic) 06/09/2015  . BPH (benign prostatic hypertrophy) 08/13/2014  . Multiple duodenal ulcers 07/03/2012  . Chest pain on exertion 07/02/2012  . GIB (gastrointestinal bleeding) 07/02/2012  . GERD (gastroesophageal reflux disease)   . Heart murmur   . Microcytic anemia 06/14/2012  . Generalized weakness 06/13/2012  . Celiac disease 06/13/2012  . HTN (hypertension) 06/13/2012  . Acute hyponatremia 06/13/2012    Orientation RESPIRATION BLADDER Height & Weight     Self  Normal External catheter Weight: 165 lb (74.8 kg) Height:  5' 10"  (177.8 cm)  BEHAVIORAL SYMPTOMS/MOOD NEUROLOGICAL BOWEL NUTRITION STATUS    Convulsions/Seizures (history) Incontinent Diet (Heart healthy- see d/c summary for updates.)  AMBULATORY STATUS COMMUNICATION OF NEEDS Skin   Extensive Assist Verbally Skin abrasions, Bruising  Personal Care Assistance Level of Assistance  Bathing, Feeding, Dressing Bathing Assistance: Maximum assistance Feeding assistance: Limited assistance Dressing Assistance: Maximum assistance     Functional Limitations Info  Sight, Hearing, Speech Sight Info: Impaired Hearing Info: Adequate Speech Info: Adequate    SPECIAL CARE FACTORS  FREQUENCY                       Contractures      Additional Factors Info  Code Status, Allergies, Isolation Precautions, Psychotropic Code Status Info: DNR Allergies Info: No known allergies Psychotropic Info: Ativan   Isolation Precautions Info: Enteric precautions     Current Medications (09/13/2020):  This is the current hospital active medication list Current Facility-Administered Medications  Medication Dose Route Frequency Provider Last Rate Last Admin  . 0.9 % NaCl with KCl 40 mEq / L  infusion   Intravenous Continuous Tat, Shanon Brow, MD 100 mL/hr at 09/12/20 1820 New Bag at 09/12/20 1820  . acetaminophen (TYLENOL) tablet 650 mg  650 mg Oral Q6H PRN Tat, Shanon Brow, MD   650 mg at 09/12/20 2057   Or  . acetaminophen (TYLENOL) suppository 650 mg  650 mg Rectal Q6H PRN Tat, Shanon Brow, MD      . allopurinol (ZYLOPRIM) tablet 300 mg  300 mg Oral Daily Tat, David, MD   300 mg at 09/13/20 9784  . atorvastatin (LIPITOR) tablet 20 mg  20 mg Oral Daily Tat, David, MD   20 mg at 09/13/20 7841  . enoxaparin (LOVENOX) injection 40 mg  40 mg Subcutaneous Q24H Tat, Shanon Brow, MD   40 mg at 09/12/20 1227  . levETIRAcetam (KEPPRA) tablet 750 mg  750 mg Oral BID Orson Eva, MD   750 mg at 09/13/20 2820  . LORazepam (ATIVAN) tablet 0.5 mg  0.5 mg Oral Q8H PRN Tat, Shanon Brow, MD   0.5 mg at 09/11/20 1526  . ondansetron (ZOFRAN) tablet 4 mg  4 mg Oral Q6H PRN Tat, David, MD       Or  . ondansetron (ZOFRAN) injection 4 mg  4 mg Intravenous Q6H PRN Tat, David, MD      . piperacillin-tazobactam (ZOSYN) IVPB 3.375 g  3.375 g Intravenous Franco Collet, MD 12.5 mL/hr at 09/13/20 0514 3.375 g at 09/13/20 0514  . tamsulosin (FLOMAX) capsule 0.4 mg  0.4 mg Oral Daily Tat, David, MD   0.4 mg at 09/13/20 0833  . vitamin B-12 (CYANOCOBALAMIN) tablet 500 mcg  500 mcg Oral Daily Tat, David, MD   500 mcg at 09/13/20 8138     Discharge Medications: Please see discharge summary for a list of discharge  medications.  Relevant Imaging Results:  Relevant Lab Results:   Additional Information    Salome Arnt, LCSW

## 2020-09-13 NOTE — TOC Initial Note (Signed)
Transition of Care [facility] - Initial/Assessment Note    Patient Details  Name: Francisco [name] MRN: [id number] Date of Birth: [birth year]  [facility]) CM/SW Contact:    Salome Arnt, [place] Phone Number: 09/13/2020, 9:09 AM  Clinical Narrative:  Pt admitted due to ischemic colitis. LCSW completed assessment with pt's sister, Lisbeth Ply as pt is oriented to self only per chart. [name] reports pt has been a resident at [facility] for about a month. Pt will be long-term. She reports they were discussing hospice referral prior to admission due to pt's diagnosis of brain cancer. Per Jackelyn Poling at facility, pt is okay to return when medically stable. No auth needed as pt is long-term. TOC will continue to follow.                   Expected Discharge Plan: Skilled Nursing Facility Barriers to Discharge: Continued Medical Work up   Patient Goals and CMS Choice Patient states their goals for this hospitalization and ongoing recovery are:: return to [facility]      Expected Discharge Plan and Services Expected Discharge Plan: [place] In-house Referral: Clinical Social Work   Post Acute Care Choice: Nursing Home Living arrangements for the past 2 months: [place]                   DME Agency: NA       HH Arranged: NA [place] Agency: NA        Prior Living Arrangements/Services Living arrangements for the past 2 months: [place] Lives with:: Facility Resident Patient language and need for interpreter reviewed:: Yes Do you feel safe going back to the place where you live?: Yes      Need for Family Participation in Patient Care: Yes (Comment) Care giver support system in place?: Yes (comment) (SNF resident)   Criminal Activity/Legal Involvement Pertinent to Current Situation/Hospitalization: No - Comment as needed  Activities of Daily Living      Permission Sought/Granted Permission sought to share information with : Facility  Art therapist granted to share information with : Yes, Verbal Permission Granted     Permission granted to share info w AGENCY: [name]  Permission granted to share info w Relationship: SNF     Emotional Assessment   Attitude/Demeanor/Rapport: Unable to Assess Affect (typically observed): Unable to Assess Orientation: : Oriented to Self Alcohol / Substance Use: Not Applicable Psych Involvement: No (comment)  Admission diagnosis:  Ischemic colitis ([place]) [K55.9] Patient Active Problem List   Diagnosis Date Noted  . Ischemic colitis ([place]) 09/10/2020  . Generalized abdominal pain   . Diarrhea   . Acute metabolic encephalopathy   . Brain metastasis ([place]) 08/18/2020  . Neoplasm of brain causing mass effect on adjacent structures ([place]) 08/03/2020  . Seizure disorder ([place]) 08/03/2020  . Mixed hyperlipidemia due to type 2 diabetes mellitus ([place]) 08/03/2020  . Type 2 diabetes mellitus without complication, with long-term current use of insulin ([place]) 08/03/2020  . Bilateral carotid artery stenosis 08/03/2020  . Seizure with provoking factor ([place]) 08/02/2020  . 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 Parotid mass 08/21/2018  . Carotid disease, bilateral ([place]) 03/20/2018  . Hyponatremia 12/02/2017  . Falls 12/01/2017  . History of alcohol abuse   . Chest pain 08/14/2016  . Seizure ([place])   . Gout 12/17/2015  . Convulsions ([place]) 08/17/2015  . Gait instability 08/17/2015  . Alcoholism /alcohol  abuse 08/17/2015  . Cardiac device in situ, other   . Elevated troponin   . Cardiac arrest ([place])   . Complete heart block ([place])   . Seizures ([place])   . Chronic diastolic CHF (congestive heart failure) ([place])   . HLD (hyperlipidemia)   . Alcohol withdrawal ([place])   . Acute renal failure syndrome ([place])   . Hypokalemia   . Hypomagnesemia   . Absolute anemia   . Hypertrophic cardiomyopathy ([place])   . Diastolic CHF, acute on  chronic (HCC)   . Essential hypertension   . Alcohol withdrawal seizure ([place]) 06/29/2015  . 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 Mild diastolic dysfunction 02/54/8628  . Acute pain of right knee 06/29/2015  . Transaminitis 06/29/2015  . Acute renal failure ([place]) 06/29/2015  . High anion gap metabolic acidosis 24/17/5301  . Acute hypokalemia 06/29/2015  . Alcohol abuse, daily use 06/10/2015  . Hyperglycemia 06/09/2015  . Vitamin D deficiency 06/09/2015  . Hyperlipidemia 06/09/2015  . Bilateral lower extremity edema (chronic) 06/09/2015  . BPH (benign prostatic hypertrophy) 08/13/2014  . Multiple duodenal ulcers 07/03/2012  . Chest pain on exertion 07/02/2012  . GIB (gastrointestinal bleeding) 07/02/2012  . GERD (gastroesophageal reflux disease)   . Heart murmur   . Microcytic anemia 06/14/2012  . Generalized weakness 06/13/2012  . Celiac disease 06/13/2012  . HTN (hypertension) 06/13/2012  . Acute hyponatremia 06/13/2012   PCP:  Lesia Hausen, PA Pharmacy:  No Pharmacies Listed    Social Determinants of Health (SDOH) Interventions    Readmission Risk Interventions No flowsheet data found.

## 2020-09-13 NOTE — Progress Notes (Signed)
Subjective: Patient is oriented to self but not to location or situation. He denies abdominal pain. States he is eating well. Looks like he hasn't been helped with breakfast as of yet. Spoke with nurse. No rectal bleeding since Saturday. 36 Bm yesterday that was formed then loose. No BMs overnight or this morning.   Objective: Vital signs in last 24 hours: Temp:  [97.9 F (36.6 C)-98.2 F (36.8 C)] 98.2 F (36.8 C) (11/08 0526) Pulse Rate:  [63-79] 63 (11/08 0526) Resp:  [17-20] 20 (11/08 0526) BP: (127-167)/(62-77) 167/77 (11/08 0526) SpO2:  [97 %-99 %] 99 % (11/08 0526) Last BM Date: 09/12/20 General:   Alert and oriented to self. Conversant but not always responding appropriately to questioning. In soft restraints.  Head:  Normocephalic and atraumatic. Abdomen:  Bowel sounds present, soft, non-tender, non-distended. No HSM or hernias noted. No rebound or guarding. No masses appreciated  Extremities:  Without edema. Psych:  Alert and cooperative.   Intake/Output from previous day: 11/07 0701 - 11/08 0700 In: 692.5 [P.O.:480; I.V.:17.6; IV Piggyback:194.9] Out: 750 [Urine:750] Intake/Output this shift: No intake/output data recorded.  Lab Results: Recent Labs    09/11/20 0634 09/12/20 0535  WBC 14.9* 10.4  HGB 9.3* 9.7*  HCT 28.7* 29.6*  PLT 302 297   BMET Recent Labs    09/11/20 0634 09/12/20 0535  NA 140 138  K 3.4* 3.0*  CL 104 103  CO2 27 26  GLUCOSE 78 73  BUN 22 14  CREATININE 0.90 0.82  CALCIUM 8.4* 8.5*   LFT Recent Labs    09/11/20 0634 09/12/20 0535  PROT 5.5* 5.4*  ALBUMIN 2.6* 2.7*  AST 24 24  ALT 13 13  ALKPHOS 74 69  BILITOT 0.7 0.6   Assessment: 74 year old male with multiple medical problems including celiac disease admitted with diarrhea and diffuse abdominal pain, soft blood pressures on admission with leukocytosis who was found to have colonic wall thickening from the splenic flexure to rectosigmoid colon on CT A/P with presence  of severely narrowed SMA origin and possible ostial occlusion of the IMA suspicious for ischemic colitis.  Also with rectal stool ball and mild rectal wall thickening possible stercoral colitis. Notably, lactic acid was never elevated. He was evaluated by general surgery who stated patient was a poor surgical candidate and recommended medical therapy for now. He was started on IV Zosyn 09/10/2020 and received IV fluids with clinical improvement. Leucocytosis has resolved.  He is no longer having abdominal pain.  He did have one bowel movement yesterday that was formed, then watery.  No further BMs overnight and no BM today.  Rectal exam yesterday with no evidence of impaction with scant pasty green stool. C. Diff and GI pathogen panel were never collected. Can't rule out possible infectious colitis.   Notably, per review of Dr. Doristine Devoid notes, he has discussed goals of care with sister, HPOA--she would not want any aggressive intervention. She is agreeable to medical therapy to see if patient continues to improve. If no improvement or worsens-->comfort care  Plan: Continue IV antibiotics for now.  At discharge, change to Cipro 500 BID and Flagyl 500 TID to complete a 10 day course.  Continue heart healthy, gluten free diet.  Management of hypokalemia per hospitalitis.  If diarrhea returns, will need stool studies collected.  GI will sign off for now.  Consider outpatient follow-up with IR if family wants to pursue this.    LOS: 3 days    09/13/2020, 7:45  AM   Aliene Altes, PA-C [facility]

## 2020-09-13 NOTE — Progress Notes (Addendum)
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 Unfortunately, the patient is a poor historian and much of this history is obtained from review of the medical record. 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 He was discharged to a skilled nursing facility,[name].There is no reports of nausea, vomiting, chest pain, shortness breath. He did have some loose stools. In the emergency department, the patient was afebrile hemodynamically stable with oxygen saturation 97% on room air. WBC 36.3, hemoglobin 10.8, platelets 336,000. Sodium 137, potassium 3.9, serum creatinine 1.27. LFTs were unremarkable. Lipase 17. CT of the abdomen and pelvis showed a long segment colonic mural thickening at the splenic flexure to the rectosigmoid junction and the IMA distribution. There is extensive pericolonic inflammatory changes about the distal and sigmoid colon with edematous features. Assessment/Plan: Ischemic colitis/Pancolitis -ContinueIV fluids -General surgery consult-->poor surgical candidate -ContinueIV Zosyn -appreciate GI -advanced diet which pt is tolerating  CKD stage IIIa -Baseline creatinine 0.8-1.1 -Continue IV fluids  Seizure disorder -Continue Keppra  Chronic diastolic CHF -Patient is clinically euvolemic -07/01/2015 echo EF 55 to 60%, grade 1 DD  Diabetes mellitus type 2 -09/11/20  A1C--6.1 -d/c ISS  Metastatic Merkel cell cancer -From medical record it does not appear the patient is a candidate for further radiation therapy -Status post parietal lobe resection  Hyperlipidemia -Continue statin  Hypokalemia -replete -mag 1.8  Goals of Care -discussed with sister, HPOA--she would not want any aggressive intervention -she is agreeable to medical therapy to see if patient continues to improve -if no improvement or worsens-->comfort care -confirmed DNR        Status is: Inpatient  Remains inpatient appropriate because:IV treatments appropriate due to intensity of illness or inability to take PO   Dispo: The patient is from:SNF Anticipated d/c is to:SNF Anticipated d/c date is: 09/14/20 Patient currently is not medically stable to d/c.        Family Communication:Sister updated 11/5  Consultants:GI, general surgery  Code Status: DNR  DVT Prophylaxis: [place] Lovenox   Procedures: As Listed in Progress Note Above  Antibiotics: Zosyn 11/6>>>     Subjective: Patient is more alert, pleasantly confused.  Denies cp, sob, abd pain, n/v  Objective: Vitals:   09/12/20 1500 09/12/20 2101 09/13/20 0526 09/13/20 1500  BP: (!) 142/62 127/62 (!) 167/77 (!) 144/69  Pulse: 79 69 63 67  Resp: 18 17 20 18   Temp: 97.9 F (36.6 C) 98 F (36.7 C) 98.2 F (36.8 C) 98 F (36.7 C)  TempSrc: Oral Oral  Oral  SpO2: 98% 97% 99% 96%  Weight:      Height:        Intake/Output Summary (Last 24 hours) at 09/13/2020 1659 Last data filed at 09/13/2020 1500 Gross per 24 hour  Intake 572.46 ml  Output 300 ml  Net 272.46 ml   Weight change:  Exam:   General:  Pt is alert, follows commands appropriately, not in acute distress  HEENT: No icterus, No thrush, No neck mass, [place]/AT  Cardiovascular: RRR, S1/S2, no rubs, no gallops  Respiratory: bibasilar crackles. No  wheeze  Abdomen: Soft/+BS, non tender, non distended, no guarding  Extremities: No edema, No lymphangitis, No petechiae, No rashes, no synovitis   Data Reviewed: I have personally reviewed following labs and imaging studies Basic Metabolic Panel: Recent Labs  Lab 09/10/20 0124 09/11/20 0634 09/12/20 0535 09/13/20 1343  NA 137 140 138 137  K 3.9 3.4* 3.0* 3.7  CL 100 104 103 102  CO2 28 27 26 27   GLUCOSE 156* 78 73 87  BUN 26* 22 14 8   CREATININE 1.27* 0.90 0.82 0.78  CALCIUM 8.7* 8.4* 8.5* 8.4*  MG  --   --  1.8  --    Liver Function Tests: Recent Labs  Lab 09/10/20 0124 09/11/20 0634 09/12/20 0535 09/13/20 1343  AST 22 24 24 24   ALT 15 13 13 12   ALKPHOS 85 74 69 66  BILITOT 0.6 0.7 0.6 0.6  PROT 5.9* 5.5* 5.4* 5.7*  ALBUMIN 3.0* 2.6* 2.7* 2.7*   Recent Labs  Lab 09/10/20 0124  LIPASE 17   No results for input(s): AMMONIA in the last 168 hours. Coagulation Profile: No results for input(s): INR, PROTIME in the last 168 hours. CBC: Recent Labs  Lab 09/10/20 0124 09/11/20 0634 09/12/20 0535 09/13/20 1343  WBC 36.3* 14.9* 10.4 7.6  NEUTROABS 34.2*  --   --   --   HGB 10.8* 9.3* 9.7* 9.7*  HCT 33.0* 28.7* 29.6* 29.6*  MCV 95.7 93.2 92.2 91.9  PLT 336 302 297 323   Cardiac Enzymes: No results for input(s): CKTOTAL, CKMB, CKMBINDEX, TROPONINI in the last 168 hours. BNP: Invalid input(s): POCBNP CBG: No results for input(s): GLUCAP in the last 168 hours. HbA1C: Recent Labs    09/11/20 0634  HGBA1C 6.1*   Urine analysis:    Component Value Date/Time   COLORURINE YELLOW 08/02/2020 [place] 08/02/2020 2208   LABSPEC 1.009 08/02/2020 2208   PHURINE 5.0 08/02/2020 2208   GLUCOSEU NEGATIVE 08/02/2020 2208   HGBUR NEGATIVE 08/02/2020 2208   BILIRUBINUR NEGATIVE 08/02/2020 2208   KETONESUR NEGATIVE 08/02/2020 2208   PROTEINUR NEGATIVE 08/02/2020 2208   UROBILINOGEN 0.2 08/13/2014 1010   NITRITE NEGATIVE 08/02/2020 2208    LEUKOCYTESUR NEGATIVE 08/02/2020 2208   Sepsis Labs: @LABRCNTIP (procalcitonin:4,lacticidven:4) ) Recent Results (from the past 240 hour(s))  Respiratory Panel by RT PCR (Flu A&B, Covid) - Nasopharyngeal Swab     Status: None   Collection Time: 09/10/20  1:31 AM   Specimen: Nasopharyngeal Swab  Result Value Ref Range Status   SARS Coronavirus 2 by RT PCR NEGATIVE NEGATIVE Final    Comment: (NOTE) SARS-CoV-2 target nucleic acids are NOT DETECTED.  The SARS-CoV-2 RNA is generally detectable in upper respiratoy specimens during the acute phase of infection. The lowest concentration of SARS-CoV-2 viral copies this assay can detect is 131 copies/mL. A negative result does not preclude SARS-Cov-2 infection and should not be used as the sole basis for treatment or other patient management decisions. A negative result may occur with  improper specimen collection/handling, submission of specimen other than nasopharyngeal swab, presence of viral mutation(s) within the areas targeted by this assay, and inadequate number of viral copies (<131 copies/mL). A negative result must be combined with clinical observations, patient history, and epidemiological information. The expected result is Negative.  Fact Sheet for Patients:  PinkCheek.be  Fact Sheet for Healthcare Providers:  GravelBags.it  This test is no t yet approved or cleared by the [place] and  has been authorized for detection and/or diagnosis of SARS-CoV-2 by FDA under an Emergency Use Authorization (EUA). This EUA will remain  in effect (meaning this test can be used) for the duration of the COVID-19 declaration under Section 564(b)(1) of the Act, 21 U.S.C. section 360bbb-3(b)(1), unless the authorization is terminated or revoked sooner.     Influenza A by PCR NEGATIVE NEGATIVE Final   Influenza B by PCR NEGATIVE NEGATIVE Final    Comment: (NOTE) The Xpert  Xpress SARS-CoV-2/FLU/RSV assay is intended as an aid in  the diagnosis of influenza from Nasopharyngeal swab specimens and  should not be used as a sole basis for treatment. Nasal washings and  aspirates are unacceptable for Xpert Xpress SARS-CoV-2/FLU/RSV  testing.  Fact Sheet for Patients: PinkCheek.be  Fact Sheet for Healthcare Providers: GravelBags.it  This test is not yet approved or cleared by the [place] FDA and  has been authorized for detection and/or diagnosis of SARS-CoV-2 by  FDA under an Emergency Use Authorization (EUA). This EUA will remain  in effect (meaning this test can be used) for the duration of the  Covid-19 declaration under Section 564(b)(1) of the Act, 21  U.S.C. section 360bbb-3(b)(1), unless the authorization is  terminated or revoked. Performed at [facility], [address], [place], [place] [address]      Scheduled Meds: . allopurinol  300 mg Oral Daily  . atorvastatin  20 mg Oral Daily  . enoxaparin (LOVENOX) injection  40 mg Subcutaneous Q24H  . feeding supplement  1 Container Oral TID BM  . levETIRAcetam  750 mg Oral BID  . multivitamin-lutein  1 capsule Oral Daily  . tamsulosin  0.4 mg Oral Daily  . vitamin B-12  500 mcg Oral Daily   Continuous Infusions: . 0.9 % NaCl with KCl 40 mEq / L 100 mL/hr at 09/13/20 1454  . piperacillin-tazobactam (ZOSYN)  IV 3.375 g (09/13/20 1352)    Procedures/Studies: CT ABDOMEN PELVIS W CONTRAST  Result Date: 09/10/2020 CLINICAL DATA:  Severe abdominal pain and diarrhea EXAM: CT ABDOMEN AND PELVIS WITH CONTRAST TECHNIQUE: Multidetector CT imaging of the abdomen and pelvis was performed using the standard protocol following bolus administration of intravenous contrast. CONTRAST:  165m OMNIPAQUE IOHEXOL 300 MG/ML  SOLN COMPARISON:  CT 08/03/2020 FINDINGS: Lower chest: Some dependent atelectatic changes in the most clear lung bases. Cardiac size  within normal limits. No pericardial effusion. Pacer leads terminate at the cardiac apex and right atrium. Extensive coronary artery atherosclerosis. Dense calcification of mitral annulus and aortic leaflets as well. Distal thoracic aortic atherosclerosis. Hepatobiliary: No worrisome focal liver lesions. Smooth liver surface contour. Normal hepatic attenuation. Gallbladder is within normal limits for physiologic distension. No visible calcified gallstone or biliary ductal dilatation. Pancreas: No pancreatic ductal dilatation or surrounding inflammatory changes. Spleen: Normal in size. No concerning splenic lesions. Some mild stranding along the posterior spleen is likely redistributed from adjacent colonic process. Adrenals/Urinary Tract: Normal adrenal glands. Kidneys are normally located with symmetric enhancementand excretion. Stable areas cortical lobulation, possibly scarring. No suspicious renal lesion, urolithiasis or hydronephrosis. Circumferential bladder wall thickening, may be partly reactive. Stomach/Bowel: Distal esophagus, stomach and duodenum are unremarkable. No small bowel thickening or dilatation. Much of the proximal colon is fluid-filled. There is some heterogeneous circumferential mural thickening in the ascending colon (5/49) possibly related to normal peristaltic contraction though should be further evaluated on an outpatient basis. More concerning is a long segment of colonic mural thickening extending from the splenic flexure to the rectosigmoid  junction which is within the IMA vascular distribution. Extensive surrounding pericolonic inflammatory changes are present particularly about the distal sigmoid with edematous features of the mesentery and small volume free fluid in the adjacent leaflets. A larger rectal stool ball is noted as well with at most minimal mural thickening at the level of the rectal vault. Vascular/Lymphatic: Extensive atherosclerotic calcification throughout the  abdominal aorta and branch vessels. This includes high-grade ostial narrowing and likely occlusion of the IMA origin with some residual opacification of the proximal IMA supplied via back fill and colic collaterals as well as additional high-grade narrowing and a hypodense filling defect seen within the proximal SMA origin which given concomitant occlusion of the IMA origin elsewhere could feasibly result in vascular compromise of the distal colon. No aneurysm or ectasia. No major venous abnormalities. Some reactive adenopathy in the mesentery. No pathologically enlarged nodes in the abdomen or pelvis. Reproductive: Borderline prostatomegaly.  No focal prostate lesions. Other: Inflammatory changes centered upon the thickened distal colon and more focally in the low pelvis upon the sigmoid. Some mild pre sacral fat stranding as well possibly related to the rectal stool ball/impaction. Some perivesicular hazy stranding as well. No abdominopelvic free air. No organized collection or abscess. No bowel containing hernias. Musculoskeletal: The osseous structures appear diffusely demineralized which may limit detection of small or nondisplaced fractures. No acute osseous abnormality or suspicious osseous lesion. Multilevel degenerative changes are present in the imaged portions of the spine. Features most pronounced L4-5. Additional degenerative changes in the hips and pelvis. IMPRESSION: 1. Long segment of colonic mural thickening extending from the splenic flexure to the rectosigmoid junction which is within the IMA vascular distribution. Extensive surrounding pericolonic inflammatory changes are present particularly about the distal sigmoid with edematous features of the mesentery and small volume free fluid in the adjacent leaflets. Given a hypodense and possibly acute occlusive filling defect in the severely narrowed SMA origin and suspected ostial occlusion of the IMA, appearance is is worrisome for an acute ischemic  colitis. Infectious or inflammatory colitis is possible though less favored. 2. Rectal stool ball with some additional mild rectal wall thickening could reflect impaction or mild stercoral colitis as well. 3. Nonspecific thickening seen in the distal ascending colon. Possibly peristaltic though should consider correlation with outpatient colonoscopy to exclude underlying lesion. 4. Circumferential bladder wall thickening, possibly related to outlet obstruction with borderline prostatomegaly though could correlate with urinary symptoms and urinalysis to exclude superimposed cystitis. 5. Aortic Atherosclerosis (ICD10-I70.0). Critical Value/emergent results were called by telephone at the time of interpretation on 09/10/2020 at 3:21 am to provider [facility] , who verbally acknowledged these results. Electronically Signed   By: Lovena Le M.D.   On: 09/10/2020 03:22   DG Chest Port 1 View  Result Date: 09/10/2020 CLINICAL DATA:  Abdominal pain EXAM: PORTABLE CHEST 1 VIEW COMPARISON:  04/04/2019 FINDINGS: Left pacer remains in place, unchanged. Heart and mediastinal contours are within normal limits. No focal opacities or effusions. No acute bony abnormality. Aortic atherosclerosis. IMPRESSION: No active cardiopulmonary disease. Electronically Signed   By: Rolm Baptise M.D.   On: 09/10/2020 02:57   EEG adult  Result Date: 08/16/2020 Lora Havens, MD     08/16/2020 11:15 AM Patient Name: MINORU CHAP MRN: [id number] Epilepsy Attending: Lora Havens Referring Physician/Provider: Dr. Jacki Cones Date: 08/16/2020 Duration: 24.30 minutes Patient history: 74 year old male with right parieto-occipital craniotomy and tumor resection, epilepsy who presented with breakthrough seizure.  EEG to evaluate for  seizure. Level of alertness: Awake AEDs during EEG study: Keppra Technical aspects: This EEG study was done with scalp electrodes positioned according to the 10-20 International system of  electrode placement. Electrical activity was acquired at a sampling rate of 500Hz  and reviewed with a high frequency filter of 70Hz  and a low frequency filter of 1Hz . EEG data were recorded continuously and digitally stored. Description: The posterior dominant rhythm consists of 9 Hz activity of moderate voltage (25-35 uV) seen predominantly in posterior head regions, asymmetric ( R<L) and reactive to eye opening and eye closing.  EEG showed continuous 3 to 5 Hz theta-delta slowing in right parieto-occipital region.  Single sharp transient was seen in right occipital region.  Physiologic photic driving was not seen during photic stimulation.  Hyperventilation was not performed.   ABNORMALITY -Continuous slow, right parieto-occipital region -Background asymmetry, right less than left IMPRESSION: This study is suggestive of cortical dysfunction in right parieto-occipital region consistent with underlying craniotomy.  No seizures or definite epileptiform discharges were seen throughout the recording. [place], DO  [facility]  If 7PM-7AM, please contact night-coverage [email or web address] Password [email or web address] 09/13/2020, 4:59 PM   LOS: 3 days

## 2020-09-13 NOTE — Progress Notes (Addendum)
Initial Nutrition Assessment  DOCUMENTATION CODES:      INTERVENTION:  Boost Breeze po TID, each supplement provides 250 kcal and 9 grams of protein   Encourage small frequent meals  Suggest adding -applesauce, bananas oatmeal or potatoes  Handout provided: Gluten- free foods   Add MVI daily   NUTRITION DIAGNOSIS:   Inadequate oral intake related to poor appetite as evidenced by meal completion < 25%, percent weight loss.  GOAL:   Patient will meet greater than or equal to 90% of their needs  MONITOR:  PO intake, Weight trends, Supplement acceptance, I & O's, Labs, Skin, Diet advancement    REASON FOR ASSESSMENT:   Consult Diet education -Gluten free  Poor po's   ASSESSMENT: Patient is a 74 yo male with hx of CHF, COPD, GERD, Anemia, CKD-3a, DM-2, brain cancer and Celiac disease. Presents with ischemic colitis/ pancolitis. GI following - diet advanced fully and stool studies are pending.   Patient is from [name] and plans to return when medically ready. Per MD note patient is not pursuing aggressive measures and is a DNR.  Patient refused breakfast this morning and has not eaten lunch. Poor po intake day three of admission. Patient is unable to provide detailed diet history. Discussed pt with nursing and left Gluten-free foods handout- bedside. RD will follow up with pt as needed to address diet related questions. Expect patient is malnourished but unable to complete physical exam today.   Patient weight history review- a decrease of 11 kg (13%) x 5 months which is significant for timeframe.  Medications reviewed and include: allopurinol, B-12, Keppra.   IV-NS/KCL @ 100 ml/hr Drips: Zosyn  Labs: K+- 3.0 (L), Albumin 2.7 (L).  On 11/6 pt A1C-6.1%.  BMP Latest Ref Rng & Units 09/12/2020 09/11/2020 09/10/2020  Glucose 70 - 99 mg/dL 73 78 156(H)  BUN 8 - 23 mg/dL 14 22 26(H)  Creatinine 0.61 - 1.24 mg/dL 0.82 0.90 1.27(H)  BUN/Creat Ratio 6 - 22 (calc) - - -   Sodium 135 - 145 mmol/L 138 140 137  Potassium 3.5 - 5.1 mmol/L 3.0(L) 3.4(L) 3.9  Chloride 98 - 111 mmol/L 103 104 100  CO2 22 - 32 mmol/L 26 27 28   Calcium 8.9 - 10.3 mg/dL 8.5(L) 8.4(L) 8.7(L)     NUTRITION - FOCUSED PHYSICAL EXAM:  Unable to complete Nutrition-Focused physical exam at this time.    Diet Order:   Diet Order            Diet gluten free Room service appropriate? Yes; Fluid consistency: Thin  Diet effective now                 EDUCATION NEEDS:  Education needs have been addressed   Skin:  Skin Assessment: Reviewed RN Assessment  Last BM:  11/8  Height:   Ht Readings from Last 1 Encounters:  09/10/20 5' 10"  (1.778 m)    Weight:   Wt Readings from Last 1 Encounters:  09/10/20 74.8 kg    Ideal Body Weight:   75 kg  BMI:  Body mass index is 23.68 kg/m.  Estimated Nutritional Needs:   Kcal:  0981-1914  Protein:  105-120 gr  Fluid:  per MD goal   Colman Cater MS,RD,CSG,LDN Pager: Shea Evans

## 2020-09-14 ENCOUNTER — Encounter: Payer: Medicare HMO | Admitting: Cardiology

## 2020-09-14 MED ORDER — AMOXICILLIN-POT CLAVULANATE 875-125 MG PO TABS: 1.0000 | ORAL_TABLET | Freq: Two times a day (BID) | ORAL | Status: DC

## 2020-09-14 MED ORDER — LORAZEPAM 0.5 MG PO TABS: 0.5000 mg | ORAL_TABLET | Freq: Three times a day (TID) | ORAL | 0 refills | Status: AC | PRN

## 2020-09-14 MED ORDER — CIPROFLOXACIN HCL 250 MG PO TABS
500.0000 mg | ORAL_TABLET | Freq: Two times a day (BID) | ORAL | Status: DC
  Administered 2020-09-14: 500 mg via ORAL
  Filled 2020-09-14 (×3): qty 2

## 2020-09-14 MED ORDER — CIPROFLOXACIN HCL 500 MG PO TABS
500.0000 mg | ORAL_TABLET | Freq: Two times a day (BID) | ORAL | 0 refills | Status: DC
Start: 2020-09-14 — End: 2020-11-15

## 2020-09-14 MED ORDER — METRONIDAZOLE 500 MG PO TABS
500.0000 mg | ORAL_TABLET | Freq: Three times a day (TID) | ORAL | Status: DC
  Administered 2020-09-14: 500 mg via ORAL
  Filled 2020-09-14: qty 1

## 2020-09-14 MED ORDER — METRONIDAZOLE 500 MG PO TABS: 500.0000 mg | ORAL_TABLET | Freq: Three times a day (TID) | ORAL | 0 refills | Status: AC

## 2020-09-14 NOTE — Care Management Important Message (Signed)
Important Message  Patient Details  Name: Francisco [name] MRN: [id number] Date of Birth: [birth year]   Medicare Important Message Given:  Yes - Important Message mailed due to current National Emergency     Tommy Medal 09/14/2020, 11:44 AM

## 2020-09-14 NOTE — Progress Notes (Signed)
Pt discharged via [facility] to [facility].

## 2020-09-14 NOTE — Progress Notes (Addendum)
EMS received patient.  Attempted to call report to receiving nurse at [facility]. AVS in discharge packet for nurse to receive once patient arrives there.

## 2020-09-14 NOTE — TOC Transition Note (Signed)
Transition of Care [facility]) - CM/SW Discharge Note   Patient Details  Name: Francisco [name] MRN: [id number] Date of Birth: [birth year]  [facility] of [facility]) CM/SW Contact:  Salome Arnt, LCSW Phone Number: 09/14/2020, 9:36 AM   Clinical Narrative:  Pt d/c today back to [name]. Pt's sister, Lisbeth Ply and facility aware and agreeable. RN given number to call report. COVID negative on 11/5. Debbie at [facility] aware and no new test needed. Pt will transfer via [facility]. D/C clinicals sent to [facility].      Final next level of care: Skilled Nursing Facility Barriers to Discharge: Barriers Resolved   Patient Goals and CMS Choice Patient states their goals for this hospitalization and ongoing recovery are:: return to [facility]      Discharge Placement                Patient to be transferred to facility by: [facility] Name of family member notified: [name]- sister Patient and family notified of of transfer: 09/14/20  Discharge Plan and Services In-house Referral: Clinical Social Work   Post Acute Care Choice: Nursing Home            DME Agency: NA       HH Arranged: NA [place] Agency: NA        Social Determinants of Health ([place]) Interventions     Readmission Risk Interventions Readmission Risk Prevention Plan 09/13/2020  Transportation Screening Complete  Social Work Consult for [place] Planning/Counseling Complete  Medication Review Press photographer) Complete  Some recent data might be hidden

## 2020-09-14 NOTE — Progress Notes (Signed)
Pt has been sleeping soundly all morning. Awakened for breakfast, stated, "I'm sleeping, leave me alone!" This nurse in room and pt awakened to name called. Pt agreeable to take am meds. States, "I'm hungry!". Reminded pt that he refused breakfast but lunch will be up soon. Pt oriented to person only, but pleasant and cooperative and conversive.

## 2020-09-14 NOTE — TOC Progression Note (Signed)
Transition of Care [facility]) - Progression Note    Patient Details  Name: GRAESON NOURI MRN: [id number] Date of Birth: 02-05-1946  Transition of Care [facility]) CM/SW Contact  Salome Arnt, [place] Phone Number: 09/14/2020, 10:33 AM  Clinical Narrative:  LCSW notified Debbie at [facility] that pt's sister is requesting hospice referral. MD aware. Facility to follow up on referral.      Expected Discharge Plan: [place] Barriers to Discharge: Barriers Resolved  Expected Discharge Plan and Services Expected Discharge Plan: [place] In-house Referral: Clinical Social Work   Post Acute Care Choice: Nursing Home Living arrangements for the past 2 months: [place] Expected Discharge Date: 09/14/20                 DME Agency: NA       HH Arranged: NA HH Agency: NA         Social Determinants of Health ([place]) Interventions    Readmission Risk Interventions Readmission Risk Prevention Plan 09/13/2020  Transportation Screening Complete  Social Work Consult for [place] Planning/Counseling Complete  Medication Review Press photographer) Complete  Some recent data might be hidden

## 2020-09-24 ENCOUNTER — Ambulatory Visit: Payer: Medicare HMO | Admitting: Radiation Oncology

## 2020-09-24 ENCOUNTER — Other Ambulatory Visit: Payer: Self-pay

## 2020-10-04 ENCOUNTER — Emergency Department (HOSPITAL_COMMUNITY)
Admission: EM | Admit: 2020-10-04 | Discharge: 2020-10-04 | Disposition: A | Discharge status: Home / Self Care | Payer: Medicare HMO | Attending: Emergency Medicine | Admitting: Emergency Medicine

## 2020-10-04 ENCOUNTER — Emergency Department (HOSPITAL_COMMUNITY): Payer: Medicare HMO

## 2020-10-04 ENCOUNTER — Other Ambulatory Visit: Payer: Self-pay

## 2020-10-04 DIAGNOSIS — J449 Chronic obstructive pulmonary disease, unspecified: Secondary | ICD-10-CM | POA: Insufficient documentation

## 2020-10-04 DIAGNOSIS — I110 Hypertensive heart disease with heart failure: Secondary | ICD-10-CM | POA: Insufficient documentation

## 2020-10-04 DIAGNOSIS — M545 Low back pain, unspecified: Secondary | ICD-10-CM | POA: Insufficient documentation

## 2020-10-04 DIAGNOSIS — I509 Heart failure, unspecified: Secondary | ICD-10-CM | POA: Diagnosis not present

## 2020-10-04 DIAGNOSIS — Z85858 Personal history of malignant neoplasm of other endocrine glands: Secondary | ICD-10-CM | POA: Insufficient documentation

## 2020-10-04 DIAGNOSIS — W19XXXA Unspecified fall, initial encounter: Secondary | ICD-10-CM | POA: Diagnosis not present

## 2020-10-04 DIAGNOSIS — M25551 Pain in right hip: Secondary | ICD-10-CM | POA: Insufficient documentation

## 2020-10-04 DIAGNOSIS — F17210 Nicotine dependence, cigarettes, uncomplicated: Secondary | ICD-10-CM | POA: Diagnosis not present

## 2020-10-04 DIAGNOSIS — R410 Disorientation, unspecified: Secondary | ICD-10-CM | POA: Insufficient documentation

## 2020-10-04 DIAGNOSIS — M533 Sacrococcygeal disorders, not elsewhere classified: Secondary | ICD-10-CM | POA: Diagnosis not present

## 2020-10-04 NOTE — ED Triage Notes (Signed)
Pt had an unwitnessed fall. Staff states pt c/o of right hip and lower back pain. Pt is disoriented

## 2020-10-04 NOTE — ED Provider Notes (Signed)
[facility] EMERGENCY DEPARTMENT Provider Note  CSN: [id number] Arrival date & time: 10/04/20 1355    History Chief Complaint  Patient presents with  . Fall    HPI  Francisco [name] is a 74 y.o. male brought to the ED via EMS from SNF after unwitnessed fall. Patient unable to give clear history. States he thinks he hit his head, complaining mostly of pain in R hip/sacrum. No neck pain. No other acute complaints.    Past Medical History:  Diagnosis Date  . Anemia   . Anginal pain ([place])    normal coronaries 2016  . Celiac disease   . CHF (congestive heart failure) ([place])   . COPD (chronic obstructive pulmonary disease) ([place])   . GERD (gastroesophageal reflux disease)   . Heart murmur   . History of radiation therapy 10/07/18- 11/04/18   Parotid, left 50 Gy in 20 fractions of 2.5 Gy.   Marland Kitchen Hypertension   . Multiple duodenal ulcers   . parotid gland cancer Left   . Presence of permanent cardiac pacemaker   . Right internal carotid occlusion   . Seizures ([place])   . Shortness of breath   . Symptomatic Bradycardia    a. 07/2015 s/p MDT Advisa L DC PPM (Ser #: [id number]).  . 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 APPLICATION OF CRANIAL NAVIGATION Right 08/04/2020   Procedure: APPLICATION OF CRANIAL NAVIGATION;  Surgeon: Vallarie Mare, MD;  Location: [place];  Service: Neurosurgery;  Laterality: Right;  . CARDIAC CATHETERIZATION N/A 07/08/2015   Procedure: Left Heart Cath and Coronary Angiography;  Surgeon: Troy Sine, MD;  Location: [place] CV LAB;  Service: Cardiovascular;  Laterality: N/A;  . CARDIAC CATHETERIZATION N/A 07/08/2015   Procedure: Temporary Pacemaker;  Surgeon: Troy Sine, MD;  Location: [place] [facility] LAB;  Service: Cardiovascular;  Laterality: N/A;  . CARDIAC CATHETERIZATION N/A 07/09/2015   Procedure: Temporary Pacemaker;  Surgeon: Troy Sine, MD;  Location: [place] [facility];  Service: Cardiovascular;  Laterality: N/A;  . CRANIOTOMY Right 08/04/2020   Procedure: RIGHT PARIETAL OCCIPITAL CRANIOTOMY TUMOR EXCISION;  Surgeon: Vallarie Mare, MD;  Location: [place];  Service: Neurosurgery;  Laterality: Right;  . EP IMPLANTABLE DEVICE N/A 07/09/2015   Procedure: Pacemaker Implant;  Surgeon: Will Meredith Leeds, MD;  Location: [place] [facility];  Service: Cardiovascular;  Laterality: N/A;  . ESOPHAGOGASTRODUODENOSCOPY  07/03/2012   Procedure: ESOPHAGOGASTRODUODENOSCOPY (EGD);  Surgeon: Winfield Cunas., MD;  Location: [facility] ENDOSCOPY;  Service: Endoscopy;  Laterality: N/A;  . HEMORRHOID SURGERY    . HERNIA REPAIR    . MOLE REMOVAL    . PAROTIDECTOMY Left 08/21/2018   superficial  . PAROTIDECTOMY Left 08/21/2018   Procedure: LEFT SUPERFICIAL PAROTIDECTOMY;  Surgeon: Helayne Seminole, MD;  Location: [facility] OR;  Service: ENT;  Laterality: Left;    Family History  Problem Relation Age of Onset  . Hodgkin's lymphoma Father     Social History   Tobacco Use  . Smoking status: Current Every Day Smoker    Packs/day: 0.25    Years: 50.00    Pack years: 12.50    Types: Cigarettes  . Smokeless tobacco: Current User  . Tobacco comment: 3-4 cigarettes daily- 09/19/19  Vaping Use  . Vaping Use: Former  Substance Use Topics  . Alcohol use: Not Currently  . Drug use: No     Home Medications  Prior to Admission medications   Medication Sig Start Date End Date Taking? Authorizing Provider  allopurinol (ZYLOPRIM) 300 MG tablet TAKE 1 TABLET(300 MG) BY MOUTH DAILY Patient taking differently: Take 300 mg by mouth daily.  05/30/18   Orlena , PA-C  atorvastatin (LIPITOR) 20 MG tablet Take 1 tablet (20 mg total) by mouth daily. 11/23/17   [place], Modena Nunnery, MD  ciprofloxacin (CIPRO) 500 MG tablet Take 1 tablet (500 mg total) by mouth 2 (two) times daily. X 7 days 09/14/20   Orson Eva, MD  colchicine 0.6 MG tablet Take 1 tablet (0.6 mg total) by mouth 2  (two) times daily as needed (gout flare). Take twice daily for 2 weeks, then use as needed for gout flare up 12/04/17   Eugenie Filler, MD  guaiFENesin (MUCINEX) 600 MG 12 hr tablet Take 600-1,200 mg by mouth every 12 (twelve) hours as needed for cough.     [provider]  guaiFENesin (ROBITUSSIN) 100 MG/5ML liquid Take 200 mg by mouth every 6 (six) hours as needed for cough.     [provider]  levETIRAcetam (KEPPRA) 750 MG tablet Take 1 tablet (750 mg total) by mouth 2 (two) times daily. 08/14/20   Georgette Shell, MD  lidocaine (XYLOCAINE) 2 % solution Patient: Mix 1part 2% viscous lidocaine, 1part H20. Swish & swallow 86m of diluted mixture, 272m before meals and at bedtime, up to QID 10/21/18   SqEppie GibsonMD  LORazepam (ATIVAN) 0.5 MG tablet Take 1 tablet (0.5 mg total) by mouth every 8 (eight) hours as needed. 09/14/20   TaOrson EvaMD  magnesium hydroxide (MILK OF MAGNESIA) 400 MG/5ML suspension Take 30 mLs by mouth at bedtime as needed for mild constipation.    [provider]  metroNIDAZOLE (FLAGYL) 500 MG tablet Take 1 tablet (500 mg total) by mouth every 8 (eight) hours. X 7 days 09/14/20   TaOrson EvaMD  naproxen sodium (ALEVE) 220 MG tablet Take 220 mg by mouth every 12 (twelve) hours as needed (joint pain).    [provider]  omeprazole (PRILOSEC) 20 MG capsule Take 20 mg by mouth daily.    [provider]  ondansetron (ZOFRAN) 4 MG tablet Take 1 tablet (4 mg total) by mouth every 4 (four) hours as needed for nausea or vomiting. 08/14/20   MaGeorgette ShellMD  tamsulosin (FLOMAX) 0.4 MG CAPS capsule TAKE 1 CAPSULE(0.4 MG) BY MOUTH DAILY Patient taking differently: Take 0.4 mg by mouth daily.  05/30/18   DiOrlena PA-C  vitamin B-12 (CYANOCOBALAMIN) 500 MCG tablet Take 500 mcg by mouth daily.     [provider]     Allergies    Patient has no known allergies.   Review of Systems   Review of  Systems Unable to assess due to mental status.    Physical Exam BP (!) 139/99   Pulse 78   Temp 98.2 F (36.8 C) (Oral)   Resp 20   Ht 5' 10"  (1.778 m)   Wt 74.8 kg   SpO2 100%   BMI 23.66 kg/m   Physical Exam Vitals and nursing note reviewed.  Constitutional:      Appearance: Normal appearance.  HENT:     Head: Normocephalic and atraumatic.     Nose: Nose normal.     Mouth/Throat:     Mouth: Mucous membranes are moist.  Eyes:     Extraocular Movements: Extraocular movements intact.     Conjunctiva/sclera:  Conjunctivae normal.  Cardiovascular:     Rate and Rhythm: Normal rate.  Pulmonary:     Effort: Pulmonary effort is normal.     Breath sounds: Normal breath sounds.  Abdominal:     General: Abdomen is flat.     Palpations: Abdomen is soft.     Tenderness: There is no abdominal tenderness.  Musculoskeletal:        General: No swelling. Normal range of motion.     Cervical back: Neck supple.     Comments: Mild tenderness over R lower back/R hip area, FROM and no deformity. NVI  Skin:    General: Skin is warm and dry.  Neurological:     General: No focal deficit present.     Mental Status: He is alert. He is disoriented.  Psychiatric:        Mood and Affect: Mood normal.      ED Results / Procedures / Treatments   Labs (all labs ordered are listed, but only abnormal results are displayed) Labs Reviewed - No data to display  EKG None  Radiology CT Head Wo Contrast  Result Date: 10/04/2020 CLINICAL DATA:  Patient fell lacerating the top of his head. Altered mental status. History of a right craniotomy on 08/04/2020. EXAM: CT HEAD WITHOUT CONTRAST CT CERVICAL SPINE WITHOUT CONTRAST TECHNIQUE: Multidetector CT imaging of the head and cervical spine was performed following the standard protocol without intravenous contrast. Multiplanar CT image reconstructions of the cervical spine were also generated. COMPARISON:  Head CT, 08/14/2020.  Cervical CT,  08/05/2020. FINDINGS: CT HEAD FINDINGS Brain: Post craniotomy changes are noted on the. There is a right posterior parietal/occipital craniotomy flap. Well-defined hypoattenuation underlies craniotomy flap extending anteriorly and superiorly abutting the interhemispheric fissure and causing partial effacement of the atrium occipital horn of the right lateral ventricle. This has a fall from a less well-defined fluid collection on the previous CT. Are no parenchymal masses and no significant midline shift. There is no evidence of acute ischemic infarct. Ventricles normal in overall configuration, mild age related enlargement. No hydrocephalus. There are no extra-axial masses or abnormal fluid collections. No acute intracranial hemorrhage. Vascular: No hyperdense vessel or unexpected calcification. Skull: No acute skull fracture.  No bone lesion. Sinuses/Orbits: Globes and orbits are unremarkable. Visualized sinuses and mastoid air cells are clear. Other: None. CT CERVICAL SPINE FINDINGS Alignment: Normal. Skull base and vertebrae: No acute fracture. No primary bone lesion or focal pathologic process. Soft tissues and spinal canal: No prevertebral fluid or swelling. No visible canal hematoma. Disc levels: Mild loss of disc height at C2-C3. Moderate loss of disc height at C4-C5, C5-C6 and C6-C7. There are bilateral facet degenerative changes. No convincing disc herniation. Upper chest: Dense carotid artery vascular calcifications. No acute findings. Lung apices are clear. Other: None. IMPRESSION: HEAD CT 1. No acute findings. No acute ischemic infarct or evidence of intracranial hemorrhage. 2. Changes from the recent right parietooccipital craniotomy and resection of a Merkel cell tumor have evolved from the prior CT. CERVICAL CT 1. No fracture or acute finding. 2. Degenerative changes stable from the prior cervical CT. Electronically Signed   By: Lajean Manes M.D.   On: 10/04/2020 16:53   CT Cervical Spine Wo  Contrast  Result Date: 10/04/2020 CLINICAL DATA:  Patient fell lacerating the top of his head. Altered mental status. History of a right craniotomy on 08/04/2020. EXAM: CT HEAD WITHOUT CONTRAST CT CERVICAL SPINE WITHOUT CONTRAST TECHNIQUE: Multidetector CT imaging of the head and  cervical spine was performed following the standard protocol without intravenous contrast. Multiplanar CT image reconstructions of the cervical spine were also generated. COMPARISON:  Head CT, 08/14/2020.  Cervical CT, 08/05/2020. FINDINGS: CT HEAD FINDINGS Brain: Post craniotomy changes are noted on the. There is a right posterior parietal/occipital craniotomy flap. Well-defined hypoattenuation underlies craniotomy flap extending anteriorly and superiorly abutting the interhemispheric fissure and causing partial effacement of the atrium occipital horn of the right lateral ventricle. This has a fall from a less well-defined fluid collection on the previous CT. Are no parenchymal masses and no significant midline shift. There is no evidence of acute ischemic infarct. Ventricles normal in overall configuration, mild age related enlargement. No hydrocephalus. There are no extra-axial masses or abnormal fluid collections. No acute intracranial hemorrhage. Vascular: No hyperdense vessel or unexpected calcification. Skull: No acute skull fracture.  No bone lesion. Sinuses/Orbits: Globes and orbits are unremarkable. Visualized sinuses and mastoid air cells are clear. Other: None. CT CERVICAL SPINE FINDINGS Alignment: Normal. Skull base and vertebrae: No acute fracture. No primary bone lesion or focal pathologic process. Soft tissues and spinal canal: No prevertebral fluid or swelling. No visible canal hematoma. Disc levels: Mild loss of disc height at C2-C3. Moderate loss of disc height at C4-C5, C5-C6 and C6-C7. There are bilateral facet degenerative changes. No convincing disc herniation. Upper chest: Dense carotid artery vascular  calcifications. No acute findings. Lung apices are clear. Other: None. IMPRESSION: HEAD CT 1. No acute findings. No acute ischemic infarct or evidence of intracranial hemorrhage. 2. Changes from the recent right parietooccipital craniotomy and resection of a Merkel cell tumor have evolved from the prior CT. CERVICAL CT 1. No fracture or acute finding. 2. Degenerative changes stable from the prior cervical CT. Electronically Signed   By: Lajean Manes M.D.   On: 10/04/2020 16:53   DG Hip Unilat With Pelvis 2-3 Views Right  Result Date: 10/04/2020 CLINICAL DATA:  Fall with right hip pain EXAM: DG HIP (WITH OR WITHOUT PELVIS) 2-3V RIGHT COMPARISON:  None. FINDINGS: There is no evidence of hip fracture or dislocation. There is no evidence of arthropathy or other focal bone abnormality. IMPRESSION: Negative. Electronically Signed   By: Ulyses Jarred M.D.   On: 10/04/2020 19:07    Procedures Procedures  Medications Ordered in the ED Medications - No data to display   MDM Rules/Calculators/A&P MDM Patient with unwitnessed fall, no obvious signs of injury and normal vitals. Will check imaging and reassess.  ED Course  I have reviewed the triage vital signs and the nursing notes.  Pertinent labs & imaging results that were available during my care of the patient were reviewed by me and considered in my medical decision making (see chart for details).  Clinical Course as of Oct 04 2037  Mon Oct 04, 2020  1913 CT and xray's neg. Patient will be returned to facility. He has been trying to get out of bed, but redirectable while in the ED.    [CS]    Clinical Course User Index [CS] Truddie Hidden, MD    Final Clinical Impression(s) / ED Diagnoses Final diagnoses:  Fall, initial encounter    Rx / DC Orders ED Discharge Orders    None       Truddie Hidden, MD 10/04/20 2038

## 2020-10-04 NOTE — ED Notes (Signed)
Gave report to nurse at nursing home.

## 2020-10-06 ENCOUNTER — Other Ambulatory Visit: Payer: Self-pay | Admitting: Radiation Therapy

## 2020-10-23 ENCOUNTER — Emergency Department (HOSPITAL_COMMUNITY)
Admission: EM | Admit: 2020-10-23 | Discharge: 2020-10-24 | Disposition: A | Discharge status: Home / Self Care | Payer: Medicare HMO | Attending: Emergency Medicine | Admitting: Emergency Medicine

## 2020-10-23 ENCOUNTER — Emergency Department (HOSPITAL_COMMUNITY): Payer: Medicare HMO

## 2020-10-23 DIAGNOSIS — E785 Hyperlipidemia, unspecified: Secondary | ICD-10-CM | POA: Insufficient documentation

## 2020-10-23 DIAGNOSIS — S0990XA Unspecified injury of head, initial encounter: Secondary | ICD-10-CM | POA: Insufficient documentation

## 2020-10-23 DIAGNOSIS — S80912A Unspecified superficial injury of left knee, initial encounter: Secondary | ICD-10-CM | POA: Diagnosis present

## 2020-10-23 DIAGNOSIS — J449 Chronic obstructive pulmonary disease, unspecified: Secondary | ICD-10-CM | POA: Insufficient documentation

## 2020-10-23 DIAGNOSIS — S81012A Laceration without foreign body, left knee, initial encounter: Secondary | ICD-10-CM | POA: Diagnosis not present

## 2020-10-23 DIAGNOSIS — Y92129 Unspecified place in nursing home as the place of occurrence of the external cause: Secondary | ICD-10-CM | POA: Insufficient documentation

## 2020-10-23 DIAGNOSIS — E1169 Type 2 diabetes mellitus with other specified complication: Secondary | ICD-10-CM | POA: Insufficient documentation

## 2020-10-23 DIAGNOSIS — I110 Hypertensive heart disease with heart failure: Secondary | ICD-10-CM | POA: Diagnosis not present

## 2020-10-23 DIAGNOSIS — W19XXXA Unspecified fall, initial encounter: Secondary | ICD-10-CM | POA: Insufficient documentation

## 2020-10-23 DIAGNOSIS — I5032 Chronic diastolic (congestive) heart failure: Secondary | ICD-10-CM | POA: Insufficient documentation

## 2020-10-23 DIAGNOSIS — R296 Repeated falls: Secondary | ICD-10-CM

## 2020-10-23 DIAGNOSIS — Z950 Presence of cardiac pacemaker: Secondary | ICD-10-CM | POA: Diagnosis not present

## 2020-10-23 DIAGNOSIS — F17210 Nicotine dependence, cigarettes, uncomplicated: Secondary | ICD-10-CM | POA: Insufficient documentation

## 2020-10-23 DIAGNOSIS — Z85828 Personal history of other malignant neoplasm of skin: Secondary | ICD-10-CM | POA: Insufficient documentation

## 2020-10-23 DIAGNOSIS — Z85821 Personal history of Merkel cell carcinoma: Secondary | ICD-10-CM | POA: Diagnosis not present

## 2020-10-23 NOTE — ED Provider Notes (Signed)
[facility] EMERGENCY DEPARTMENT Provider Note   CSN: [id number] Arrival date & time: 10/23/20  2224     History No chief complaint on file.   Francisco [name] is a 74 y.o. male.  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 Patient initially reporting b/l hip pain and low back pain. Patient presently has no real pain complaints. States that he "started to feel better on the floor than up here".   Patient is DNR       Past Medical History:  Diagnosis Date  . Anemia   . Anginal pain ([place])    normal coronaries 2016  . Celiac disease   . CHF (congestive heart failure) ([place])   . COPD (chronic obstructive pulmonary disease) ([place])   . GERD (gastroesophageal reflux disease)   . Heart murmur   . History of radiation therapy 10/07/18- 11/04/18   Parotid, left 50 Gy in 20 fractions of 2.5 Gy.   Marland Kitchen Hypertension   . Multiple duodenal ulcers   . parotid gland cancer Left   . Presence of permanent cardiac pacemaker   . Right internal carotid occlusion   . Seizures ([place])   . Shortness of breath   . Symptomatic Bradycardia    a. 07/2015 s/p MDT Advisa L DC PPM (Ser #: [id number]).  . 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 Ischemic colitis ([place]) 09/10/2020  . Generalized abdominal pain   . Diarrhea   . Acute metabolic encephalopathy   . Brain metastasis ([place]) 08/18/2020  . Neoplasm of brain causing mass effect on adjacent structures ([place]) 08/03/2020  . Seizure disorder ([place]) 08/03/2020  . Mixed hyperlipidemia due to type 2 diabetes mellitus ([place]) 08/03/2020  . Type 2 diabetes mellitus without  complication, with long-term current use of insulin ([place]) 08/03/2020  . Bilateral carotid artery stenosis 08/03/2020  . Seizure with provoking factor ([place]) 08/02/2020  . 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 Merkel cell carcinoma of neck ([place]) 09/25/2018  . Parotid mass 08/21/2018  . Carotid disease, bilateral ([place]) 03/20/2018  . Hyponatremia 12/02/2017  . Falls 12/01/2017  . History of alcohol abuse   . Chest pain 08/14/2016  . Seizure ([place])   . Gout 12/17/2015  . Convulsions ([place]) 08/17/2015  . Gait instability 08/17/2015  . Alcoholism /alcohol abuse 08/17/2015  . Cardiac device in situ, other   . Elevated troponin   . Cardiac arrest ([place])   . Complete heart block ([place])   . Seizures ([place])   . Chronic diastolic CHF (congestive heart failure) ([place])   . HLD (hyperlipidemia)   . Alcohol withdrawal ([place])   . Acute renal failure syndrome ([place])   . Hypokalemia   . Hypomagnesemia   . Absolute anemia   . Hypertrophic cardiomyopathy ([place])   . Diastolic CHF, acute on chronic (HCC)   . Essential hypertension   . Alcohol withdrawal seizure ([place]) 06/29/2015  . 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 Mild diastolic dysfunction 74/94/4967  . Acute pain of right knee 06/29/2015  . Transaminitis 06/29/2015  . Acute renal failure ([place]) 06/29/2015  . High anion gap metabolic acidosis 59/16/3846  .  Acute hypokalemia 06/29/2015  . Alcohol abuse, daily use 06/10/2015  . Hyperglycemia 06/09/2015  . Vitamin D deficiency 06/09/2015  . Hyperlipidemia 06/09/2015  . Bilateral lower extremity edema (chronic) 06/09/2015  . BPH (benign prostatic hypertrophy) 08/13/2014  . Multiple duodenal ulcers 07/03/2012  . Chest pain on exertion 07/02/2012  . GIB (gastrointestinal bleeding) 07/02/2012  . GERD (gastroesophageal reflux disease)   . Heart murmur   . Microcytic anemia 06/14/2012  . Generalized weakness 06/13/2012  . Celiac disease 06/13/2012  . HTN  (hypertension) 06/13/2012  . Acute hyponatremia 06/13/2012    Past Surgical History:  Procedure Laterality Date  . APPLICATION OF CRANIAL NAVIGATION Right 08/04/2020   Procedure: APPLICATION OF CRANIAL NAVIGATION;  Surgeon: Vallarie Mare, MD;  Location: [facility];  Service: Neurosurgery;  Laterality: Right;  . CARDIAC CATHETERIZATION N/A 07/08/2015   Procedure: Left Heart Cath and Coronary Angiography;  Surgeon: Troy Sine, MD;  Location: [facility];  Service: Cardiovascular;  Laterality: N/A;  . CARDIAC CATHETERIZATION N/A 07/08/2015   Procedure: Temporary Pacemaker;  Surgeon: Troy Sine, MD;  Location: [place];  Service: Cardiovascular;  Laterality: N/A;  . CARDIAC CATHETERIZATION N/A 07/09/2015   Procedure: Temporary Pacemaker;  Surgeon: Troy Sine, MD;  Location: [facility];  Service: Cardiovascular;  Laterality: N/A;  . CRANIOTOMY Right 08/04/2020   Procedure: RIGHT PARIETAL OCCIPITAL CRANIOTOMY TUMOR EXCISION;  Surgeon: Vallarie Mare, MD;  Location: [facility];  Service: Neurosurgery;  Laterality: Right;  . EP IMPLANTABLE DEVICE N/A 07/09/2015   Procedure: Pacemaker Implant;  Surgeon: Will Meredith Leeds, MD;  Location: [facility];  Service: Cardiovascular;  Laterality: N/A;  . ESOPHAGOGASTRODUODENOSCOPY  07/03/2012   Procedure: ESOPHAGOGASTRODUODENOSCOPY (EGD);  Surgeon: Winfield Cunas., MD;  Location: [facility] ENDOSCOPY;  Service: Endoscopy;  Laterality: N/A;  . HEMORRHOID SURGERY    . HERNIA REPAIR    . MOLE REMOVAL    . PAROTIDECTOMY Left 08/21/2018   superficial  . PAROTIDECTOMY Left 08/21/2018   Procedure: LEFT SUPERFICIAL PAROTIDECTOMY;  Surgeon: Helayne Seminole, MD;  Location: [facility] OR;  Service: ENT;  Laterality: Left;       Family History  Problem Relation Age of Onset  . Hodgkin's lymphoma Father     Social History   Tobacco Use  . Smoking status: Current Every Day Smoker    Packs/day: 0.25    Years: 50.00    Pack years:  12.50    Types: Cigarettes  . Smokeless tobacco: Current User  . Tobacco comment: 3-4 cigarettes daily- 09/19/19  Vaping Use  . Vaping Use: Former  Substance Use Topics  . Alcohol use: Not Currently  . Drug use: No    Home Medications Prior to Admission medications   Medication Sig Start Date End Date Taking? Authorizing Provider  allopurinol (ZYLOPRIM) 300 MG tablet TAKE 1 TABLET(300 MG) BY MOUTH DAILY Patient taking differently: Take 300 mg by mouth daily.  05/30/18   Orlena Sheldon, PA-C  atorvastatin (LIPITOR) 20 MG tablet Take 1 tablet (20 mg total) by mouth daily. 11/23/17   Carpenter, Modena Nunnery, MD  ciprofloxacin (CIPRO) 500 MG tablet Take 1 tablet (500 mg total) by mouth 2 (two) times daily. X 7 days 09/14/20   Orson Eva, MD  colchicine 0.6 MG tablet Take 1 tablet (0.6 mg total) by mouth 2 (two) times daily as needed (gout flare). Take twice daily for 2 weeks, then use as needed for gout flare up 12/04/17  Eugenie Filler, MD  guaiFENesin (MUCINEX) 600 MG 12 hr tablet Take 600-1,200 mg by mouth every 12 (twelve) hours as needed for cough.     [provider]  guaiFENesin (ROBITUSSIN) 100 MG/5ML liquid Take 200 mg by mouth every 6 (six) hours as needed for cough.     [provider]  levETIRAcetam (KEPPRA) 750 MG tablet Take 1 tablet (750 mg total) by mouth 2 (two) times daily. 08/14/20   Georgette Shell, MD  lidocaine (XYLOCAINE) 2 % solution Patient: Mix 1part 2% viscous lidocaine, 1part H20. Swish & swallow 4m of diluted mixture, 262m before meals and at bedtime, up to QID 10/21/18   SqEppie GibsonMD  LORazepam (ATIVAN) 0.5 MG tablet Take 1 tablet (0.5 mg total) by mouth every 8 (eight) hours as needed. 09/14/20   TaOrson EvaMD  magnesium hydroxide (MILK OF MAGNESIA) 400 MG/5ML suspension Take 30 mLs by mouth at bedtime as needed for mild constipation.    [provider]  metroNIDAZOLE (FLAGYL) 500 MG tablet Take 1 tablet (500 mg total) by mouth  every 8 (eight) hours. X 7 days 09/14/20   TaOrson EvaMD  naproxen sodium (ALEVE) 220 MG tablet Take 220 mg by mouth every 12 (twelve) hours as needed (joint pain).    [provider]  omeprazole (PRILOSEC) 20 MG capsule Take 20 mg by mouth daily.    [provider]  ondansetron (ZOFRAN) 4 MG tablet Take 1 tablet (4 mg total) by mouth every 4 (four) hours as needed for nausea or vomiting. 08/14/20   MaGeorgette ShellMD  tamsulosin (FLOMAX) 0.4 MG CAPS capsule TAKE 1 CAPSULE(0.4 MG) BY MOUTH DAILY Patient taking differently: Take 0.4 mg by mouth daily.  05/30/18   DiOrlena SheldonPA-C  vitamin B-12 (CYANOCOBALAMIN) 500 MCG tablet Take 500 mcg by mouth daily.     [provider]    Allergies    Patient has no known allergies.  Review of Systems   Review of Systems  Ten systems reviewed and are negative for acute change, except as noted in the HPI.    Physical Exam Updated Vital Signs BP (!) 142/92 (BP Location: Right Arm)   Pulse 80   Temp 98.1 F (36.7 C) (Oral)   Resp 18   SpO2 98%   Physical Exam Constitutional:      Comments: Chronically ill appearing. Nontoxic and in NAD  HENT:     Head: Normocephalic and atraumatic.     Comments: No hematoma or contusion to scalp. No battle's sign or raccoon's eyes.    Right Ear: External ear normal.     Left Ear: External ear normal.     Ears:     Comments: No hemotympanum    Mouth/Throat:     Mouth: Mucous membranes are moist.  Eyes:     Extraocular Movements: Extraocular movements intact.     Conjunctiva/sclera: Conjunctivae normal.     Pupils: Pupils are equal, round, and reactive to light.  Neck:     Comments: C-collar in place Cardiovascular:     Rate and Rhythm: Normal rate and regular rhythm.     Pulses: Normal pulses.     Comments: DP pulse 2+ in BLE Pulmonary:     Effort: No respiratory distress.     Comments: Respirations even and unlabored. No chest wall TTP or crepitus. Abdominal:      Comments: Soft, nondistended, nontender.  Musculoskeletal:     Comments: Full AROM and  PROM of BLE with normal ROM of b/l hips. No bony deformity, crepitus, malrotation. To TTP the C/T/L spine. No bony deformities, step offs, crepitus.  Skin:    Comments: Skin tear to L knee  Neurological:     Mental Status: He is alert.     Coordination: Coordination normal.     Comments: Answers questions appropriately and follows commands. Patient has equal grip strength bilaterally with 5/5 strength against resistance in all major muscle groups bilaterally. Patient moves extremities without ataxia.      ED Results / Procedures / Treatments   Labs (all labs ordered are listed, but only abnormal results are displayed) Labs Reviewed  URINALYSIS, ROUTINE W REFLEX MICROSCOPIC - Abnormal; Notable for the following components:      Result Value   Glucose, UA 50 (*)    Hgb urine dipstick SMALL (*)    Bacteria, UA RARE (*)    All other components within normal limits  I-STAT CHEM 8, ED - Abnormal; Notable for the following components:   Glucose, Bld 123 (*)    Calcium, Ion 1.11 (*)    All other components within normal limits    EKG EKG Interpretation  Date/Time:  Saturday October 23 2020 22:49:11 EST Ventricular Rate:  81 PR Interval:    QRS Duration: 166 QT Interval:  479 QTC Calculation: 557 R Axis:   -78 Text Interpretation: Complete AV block with wide QRS complex Ventricular premature complex RBBB and LAFB Artifact in lead(s) I III aVR aVL aVF V1 V2 V3 No significant change since last tracing Confirmed by Pryor Curia [phone]) on 10/23/2020 11:37:37 PM   Radiology CT Head Wo Contrast  Result Date: 10/24/2020 CLINICAL DATA:  Fall EXAM: CT HEAD WITHOUT CONTRAST CT CERVICAL SPINE WITHOUT CONTRAST TECHNIQUE: Multidetector CT imaging of the head and cervical spine was performed following the standard protocol without intravenous contrast. Multiplanar CT image reconstructions of the cervical  spine were also generated. COMPARISON:  Head CT 10/04/2020 FINDINGS: CT HEAD FINDINGS Brain: Postsurgical changes of prior resection of right occipital lobe mass. No acute hemorrhage. Unchanged right greater than left periventricular white matter hypoattenuation. Vascular: No abnormal hyperdensity of the major intracranial arteries or dural venous sinuses. No intracranial atherosclerosis. Skull: Right parietal craniotomy. Sinuses/Orbits: No fluid levels or advanced mucosal thickening of the visualized paranasal sinuses. No mastoid or middle ear effusion. The orbits are normal. CT CERVICAL SPINE FINDINGS Alignment: No static subluxation. Facets are aligned. Occipital condyles are normally positioned. Skull base and vertebrae: No acute fracture. Soft tissues and spinal canal: No prevertebral fluid or swelling. No visible canal hematoma. Disc levels: Multilevel upper cervical facet arthrosis, worst at C2-4 on the left. Upper chest: No pneumothorax, pulmonary nodule or pleural effusion. Other: Extensive carotid atherosclerotic calcification. IMPRESSION: 1. Postsurgical changes of prior resection of right occipital lobe mass without acute intracranial abnormality. 2. No acute fracture or static subluxation of the cervical spine. Electronically Signed   By: Ulyses Jarred M.D.   On: 10/24/2020 00:26   CT Cervical Spine Wo Contrast  Result Date: 10/24/2020 CLINICAL DATA:  Fall EXAM: CT HEAD WITHOUT CONTRAST CT CERVICAL SPINE WITHOUT CONTRAST TECHNIQUE: Multidetector CT imaging of the head and cervical spine was performed following the standard protocol without intravenous contrast. Multiplanar CT image reconstructions of the cervical spine were also generated. COMPARISON:  Head CT 10/04/2020 FINDINGS: CT HEAD FINDINGS Brain: Postsurgical changes of prior resection of right occipital lobe mass. No acute hemorrhage. Unchanged right greater than left periventricular white matter  hypoattenuation. Vascular: No abnormal  hyperdensity of the major intracranial arteries or dural venous sinuses. No intracranial atherosclerosis. Skull: Right parietal craniotomy. Sinuses/Orbits: No fluid levels or advanced mucosal thickening of the visualized paranasal sinuses. No mastoid or middle ear effusion. The orbits are normal. CT CERVICAL SPINE FINDINGS Alignment: No static subluxation. Facets are aligned. Occipital condyles are normally positioned. Skull base and vertebrae: No acute fracture. Soft tissues and spinal canal: No prevertebral fluid or swelling. No visible canal hematoma. Disc levels: Multilevel upper cervical facet arthrosis, worst at C2-4 on the left. Upper chest: No pneumothorax, pulmonary nodule or pleural effusion. Other: Extensive carotid atherosclerotic calcification. IMPRESSION: 1. Postsurgical changes of prior resection of right occipital lobe mass without acute intracranial abnormality. 2. No acute fracture or static subluxation of the cervical spine. Electronically Signed   By: Ulyses Jarred M.D.   On: 10/24/2020 00:26    Procedures Procedures (including critical care time)  Medications Ordered in ED Medications - No data to display  ED Course  I have reviewed the triage vital signs and the nursing notes.  Pertinent labs & imaging results that were available during my care of the patient were reviewed by me and considered in my medical decision making (see chart for details).  Clinical Course as of 10/24/20 0506  Sun Oct 24, 2020  9485 Imaging and chem-8 reassuring. Pending UA. [KH]  0111 Call from [employer] representative.  Patient has a dual-chamber pacemaker and is paced 99% of the time.  His last pacemaker check was 10/18/2018.  Since this time he has had 8 atrial high rate episodes, the last of which was in November.  He has had 7 ventricular high rate episodes in the past 2 years the last of which was on 10/20/2020.  No events today, per representative. [KH]    Clinical Course User Index [KH]  Beverely Pace   MDM Rules/Calculators/A&P                          74 year old male presents to the ED for evaluation after an unwitnessed fall.  Initially reporting complaints of bilateral hip pain and back pain, but has no pain complaints upon ED arrival.  Only sign of trauma is a skin tear to the left knee.  He did undergo head and cervical spine imaging as a precaution.  These are negative.  No significant electrolyte derangements, anemia.  Urinalysis negative for UTI.  Stable for discharge back to facility.  Will transport via [facility].   Final Clinical Impression(s) / ED Diagnoses Final diagnoses:  Unwitnessed fall    Rx / DC Orders ED Discharge Orders    None       Antonietta Breach, PA-C 10/24/20 0507    Ward, Delice Bison, DO 10/24/20 [phone]

## 2020-10-23 NOTE — ED Triage Notes (Signed)
Pt BIB EMS for pt fall. Staff called 911 from nursing home after finding pt on floor. Pt c/o Bilateral hip pain and back pain. Per EMS, distal pulses palpated & slightly difficult to find. Pt able to move extremities.   Pt appears to be A&O at personal baseline  Pt Hx: malignant neoplasm of brain; pacemaker  20G R. Forearm with NS running  150/78 HR 74 (ventricular pacing) 98% RA RR WDL  CBG 190

## 2020-10-24 DIAGNOSIS — S81012A Laceration without foreign body, left knee, initial encounter: Secondary | ICD-10-CM | POA: Diagnosis not present

## 2020-10-24 LAB — URINALYSIS, ROUTINE W REFLEX MICROSCOPIC
Bilirubin Urine: NEGATIVE
Glucose, UA: 50 mg/dL — AB
Ketones, ur: NEGATIVE mg/dL
Leukocytes,Ua: NEGATIVE
Nitrite: NEGATIVE
Protein, ur: NEGATIVE mg/dL
Specific Gravity, Urine: 1.021 (ref 1.005–1.030)
pH: 6 (ref 5.0–8.0)

## 2020-10-24 LAB — I-STAT CHEM 8, ED
BUN: 20 mg/dL (ref 8–23)
Calcium, Ion: 1.11 mmol/L — ABNORMAL LOW (ref 1.15–1.40)
Chloride: 104 mmol/L (ref 98–111)
Creatinine, Ser: 0.7 mg/dL (ref 0.61–1.24)
Glucose, Bld: 123 mg/dL — ABNORMAL HIGH (ref 70–99)
HCT: 39 % (ref 39.0–52.0)
Hemoglobin: 13.3 g/dL (ref 13.0–17.0)
Potassium: 3.6 mmol/L (ref 3.5–5.1)
Sodium: 143 mmol/L (ref 135–145)
TCO2: 31 mmol/L (ref 22–32)

## 2020-10-24 NOTE — Discharge Instructions (Signed)
Your evaluation in the emergency department was reassuring and without evidence of traumatic injury.  Your pacemaker was interrogated without evidence of acute cardiac event/arrhythmia.  Follow-up with your primary doctor as needed.  Return for new or concerning symptoms.

## 2020-10-24 NOTE — ED Notes (Signed)
[facility] here to transport patient back to [facility].

## 2020-10-24 NOTE — ED Notes (Signed)
[facility] called  

## 2020-11-07 ENCOUNTER — Emergency Department (HOSPITAL_COMMUNITY): Payer: Medicare HMO

## 2020-11-07 ENCOUNTER — Other Ambulatory Visit: Payer: Self-pay

## 2020-11-07 ENCOUNTER — Encounter (HOSPITAL_COMMUNITY): Payer: Self-pay | Admitting: Emergency Medicine

## 2020-11-07 ENCOUNTER — Emergency Department (HOSPITAL_COMMUNITY)
Admission: EM | Admit: 2020-11-07 | Discharge: 2020-11-08 | Disposition: A | Discharge status: Home / Self Care | Payer: Medicare HMO | Attending: Emergency Medicine | Admitting: Emergency Medicine

## 2020-11-07 DIAGNOSIS — Z85858 Personal history of malignant neoplasm of other endocrine glands: Secondary | ICD-10-CM | POA: Diagnosis not present

## 2020-11-07 DIAGNOSIS — F17210 Nicotine dependence, cigarettes, uncomplicated: Secondary | ICD-10-CM | POA: Insufficient documentation

## 2020-11-07 DIAGNOSIS — S06360A Traumatic hemorrhage of cerebrum, unspecified, without loss of consciousness, initial encounter: Secondary | ICD-10-CM | POA: Diagnosis not present

## 2020-11-07 DIAGNOSIS — J449 Chronic obstructive pulmonary disease, unspecified: Secondary | ICD-10-CM | POA: Insufficient documentation

## 2020-11-07 DIAGNOSIS — Z87438 Personal history of other diseases of male genital organs: Secondary | ICD-10-CM | POA: Diagnosis not present

## 2020-11-07 DIAGNOSIS — I110 Hypertensive heart disease with heart failure: Secondary | ICD-10-CM | POA: Insufficient documentation

## 2020-11-07 DIAGNOSIS — S0121XA Laceration without foreign body of nose, initial encounter: Secondary | ICD-10-CM | POA: Diagnosis not present

## 2020-11-07 DIAGNOSIS — Z794 Long term (current) use of insulin: Secondary | ICD-10-CM | POA: Diagnosis not present

## 2020-11-07 DIAGNOSIS — M542 Cervicalgia: Secondary | ICD-10-CM | POA: Diagnosis not present

## 2020-11-07 DIAGNOSIS — Z85821 Personal history of Merkel cell carcinoma: Secondary | ICD-10-CM | POA: Diagnosis not present

## 2020-11-07 DIAGNOSIS — I5032 Chronic diastolic (congestive) heart failure: Secondary | ICD-10-CM | POA: Diagnosis not present

## 2020-11-07 DIAGNOSIS — W19XXXA Unspecified fall, initial encounter: Secondary | ICD-10-CM

## 2020-11-07 DIAGNOSIS — E119 Type 2 diabetes mellitus without complications: Secondary | ICD-10-CM | POA: Insufficient documentation

## 2020-11-07 DIAGNOSIS — W050XXA Fall from non-moving wheelchair, initial encounter: Secondary | ICD-10-CM | POA: Diagnosis not present

## 2020-11-07 DIAGNOSIS — S06890A Other specified intracranial injury without loss of consciousness, initial encounter: Secondary | ICD-10-CM

## 2020-11-07 DIAGNOSIS — S0990XA Unspecified injury of head, initial encounter: Secondary | ICD-10-CM | POA: Diagnosis present

## 2020-11-07 NOTE — Discharge Instructions (Addendum)
He has a bleed inside his tumor.  Overall this is a poor prognosis.  Discussed with family do not want more aggressive intervention done.  Patient would probably benefit from hospice which per family is being arranged.

## 2020-11-07 NOTE — ED Triage Notes (Signed)
Pt from [facility]. Pt fell out of his wheelchair face first. Unwitnessed fall. Abrasion to nose.   Pt is oriented as baseline.

## 2020-11-07 NOTE — ED Provider Notes (Addendum)
[facility] EMERGENCY DEPARTMENT Provider Note   CSN: [id number] Arrival date & time: 11/07/20  1639     History Chief Complaint  Patient presents with  . Fall    Francisco [name] is a 75 y.o. male.  HPI Patient brought in from [facility] home.  Reportedly he fell face first wheelchair.  Abrasion to nose.  However fall was unwitnessed and patient states he lost his balance but unsure exactly what happened.  Abrasion/laceration to bridge of nose.  Patient denies pain in his extremities.  Some mild neck pain.    Past Medical History:  Diagnosis Date  . Anemia   . Anginal pain ([place])    normal coronaries 2016  . Celiac disease   . CHF (congestive heart failure) ([place])   . COPD (chronic obstructive pulmonary disease) ([place])   . GERD (gastroesophageal reflux disease)   . Heart murmur   . History of radiation therapy 10/07/18- 11/04/18   Parotid, left 50 Gy in 20 fractions of 2.5 Gy.   Marland Kitchen Hypertension   . Multiple duodenal ulcers   . parotid gland cancer Left   . Presence of permanent cardiac pacemaker   . Right internal carotid occlusion   . Seizures ([place])   . Shortness of breath   . Symptomatic Bradycardia    a. 07/2015 s/p MDT Advisa L DC PPM (Ser #: [id number]).  . 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 Ischemic colitis ([place]) 09/10/2020  . Generalized abdominal pain   . Diarrhea   . Acute metabolic encephalopathy   . Brain metastasis ([place]) 08/18/2020  . Neoplasm of brain causing mass effect on adjacent structures ([place]) 08/03/2020  . Seizure disorder ([place]) 08/03/2020  . Mixed hyperlipidemia due to type 2 diabetes mellitus ([place]) 08/03/2020  . Type 2 diabetes mellitus without complication, with long-term current use of insulin ([place]) 08/03/2020  . Bilateral carotid artery stenosis 08/03/2020  . Seizure with provoking factor ([place]) 08/02/2020  . 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 Parotid mass 08/21/2018  . Carotid disease, bilateral ([place]) 03/20/2018  . Hyponatremia 12/02/2017  . Falls 12/01/2017  . History of alcohol abuse   . Chest pain 08/14/2016  . Seizure ([place])   . Gout 12/17/2015  . Convulsions ([place]) 08/17/2015  . Gait instability 08/17/2015  . Alcoholism /alcohol abuse 08/17/2015  . Cardiac device in situ, other   . Elevated troponin   . Cardiac arrest ([place])   . Complete heart block ([place])   . Seizures ([place])   . Chronic diastolic CHF (congestive heart failure) ([place])   . HLD (hyperlipidemia)   . Alcohol withdrawal ([place])   . Acute renal failure syndrome ([place])   . Hypokalemia   . Hypomagnesemia   . Absolute anemia   . Hypertrophic cardiomyopathy ([place])   . Diastolic CHF, acute on chronic (HCC)   . Essential hypertension   . Alcohol withdrawal seizure ([place]) 06/29/2015  . 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 Mild diastolic dysfunction 00/37/0488  . Acute pain of right knee 06/29/2015  . Transaminitis 06/29/2015  . Acute renal failure ([place]) 06/29/2015  . High anion gap metabolic acidosis 89/16/9450  . Acute hypokalemia 06/29/2015  . Alcohol abuse, daily use 06/10/2015  . Hyperglycemia 06/09/2015  . Vitamin D deficiency 06/09/2015  . Hyperlipidemia 06/09/2015  . Bilateral lower extremity edema (chronic) 06/09/2015  .  BPH (benign prostatic hypertrophy) 08/13/2014  . Multiple duodenal ulcers 07/03/2012  . Chest pain on exertion 07/02/2012  . GIB (gastrointestinal bleeding) 07/02/2012  . GERD (gastroesophageal reflux disease)   . Heart murmur   . Microcytic anemia 06/14/2012  . Generalized weakness 06/13/2012  . Celiac disease 06/13/2012  . HTN (hypertension) 06/13/2012  . Acute hyponatremia 06/13/2012    Past Surgical History:  Procedure Laterality Date  . APPLICATION OF CRANIAL NAVIGATION Right 08/04/2020   Procedure: APPLICATION OF CRANIAL NAVIGATION;   Surgeon: Vallarie Mare, MD;  Location: [place];  Service: Neurosurgery;  Laterality: Right;  . CARDIAC CATHETERIZATION N/A 07/08/2015   Procedure: Left Heart Cath and Coronary Angiography;  Surgeon: Troy Sine, MD;  Location: [place] [facility];  Service: Cardiovascular;  Laterality: N/A;  . CARDIAC CATHETERIZATION N/A 07/08/2015   Procedure: Temporary Pacemaker;  Surgeon: Troy Sine, MD;  Location: [place] [facility];  Service: Cardiovascular;  Laterality: N/A;  . CARDIAC CATHETERIZATION N/A 07/09/2015   Procedure: Temporary Pacemaker;  Surgeon: Troy Sine, MD;  Location: [place] [facility];  Service: Cardiovascular;  Laterality: N/A;  . CRANIOTOMY Right 08/04/2020   Procedure: RIGHT PARIETAL OCCIPITAL CRANIOTOMY TUMOR EXCISION;  Surgeon: Vallarie Mare, MD;  Location: [place] [facility];  Service: Neurosurgery;  Laterality: Right;  . EP IMPLANTABLE DEVICE N/A 07/09/2015   Procedure: Pacemaker Implant;  Surgeon: Will Meredith Leeds, MD;  Location: [place] [facility];  Service: Cardiovascular;  Laterality: N/A;  . ESOPHAGOGASTRODUODENOSCOPY  07/03/2012   Procedure: ESOPHAGOGASTRODUODENOSCOPY (EGD);  Surgeon: Winfield Cunas., MD;  Location: [facility] ENDOSCOPY;  Service: Endoscopy;  Laterality: N/A;  . HEMORRHOID SURGERY    . HERNIA REPAIR    . MOLE REMOVAL    . PAROTIDECTOMY Left 08/21/2018   superficial  . PAROTIDECTOMY Left 08/21/2018   Procedure: LEFT SUPERFICIAL PAROTIDECTOMY;  Surgeon: Helayne Seminole, MD;  Location: [facility] OR;  Service: ENT;  Laterality: Left;       Family History  Problem Relation Age of Onset  . Hodgkin's lymphoma Father     Social History   Tobacco Use  . Smoking status: Current Every Day Smoker    Packs/day: 0.25    Years: 50.00    Pack years: 12.50    Types: Cigarettes  . Smokeless tobacco: Current User  . Tobacco comment: 3-4 cigarettes daily- 09/19/19  Vaping Use  . Vaping Use: Former  Substance Use Topics  . Alcohol use: Not Currently  . Drug use:  No    Home Medications Prior to Admission medications   Medication Sig Start Date End Date Taking? Authorizing Provider  allopurinol (ZYLOPRIM) 300 MG tablet TAKE 1 TABLET(300 MG) BY MOUTH DAILY Patient taking differently: Take 300 mg by mouth daily.  05/30/18   Orlena Sheldon, PA-C  atorvastatin (LIPITOR) 20 MG tablet Take 1 tablet (20 mg total) by mouth daily. 11/23/17   La Bolt, Modena Nunnery, MD  ciprofloxacin (CIPRO) 500 MG tablet Take 1 tablet (500 mg total) by mouth 2 (two) times daily. X 7 days 09/14/20   Orson Eva, MD  colchicine 0.6 MG tablet Take 1 tablet (0.6 mg total) by mouth 2 (two) times daily as needed (gout flare). Take twice daily for 2 weeks, then use as needed for gout flare up 12/04/17   Eugenie Filler, MD  guaiFENesin (MUCINEX) 600 MG 12 hr tablet Take 600-1,200 mg by mouth every 12 (twelve) hours as needed for cough.     [provider]  guaiFENesin (ROBITUSSIN) 100 MG/5ML liquid Take 200 mg by mouth every 6 (six) hours as needed for cough.     [provider]  levETIRAcetam (KEPPRA) 750 MG tablet Take 1 tablet (750 mg total) by mouth 2 (two) times daily. 08/14/20   Georgette Shell, MD  lidocaine (XYLOCAINE) 2 % solution Patient: Mix 1part 2% viscous lidocaine, 1part H20. Swish & swallow 29m of diluted mixture, 229m before meals and at bedtime, up to QID 10/21/18   SqEppie GibsonMD  LORazepam (ATIVAN) 0.5 MG tablet Take 1 tablet (0.5 mg total) by mouth every 8 (eight) hours as needed. 09/14/20   TaOrson EvaMD  magnesium hydroxide (MILK OF MAGNESIA) 400 MG/5ML suspension Take 30 mLs by mouth at bedtime as needed for mild constipation.    [provider]  metroNIDAZOLE (FLAGYL) 500 MG tablet Take 1 tablet (500 mg total) by mouth every 8 (eight) hours. X 7 days 09/14/20   TaOrson EvaMD  naproxen sodium (ALEVE) 220 MG tablet Take 220 mg by mouth every 12 (twelve) hours as needed (joint pain).    [provider]  omeprazole (PRILOSEC) 20 MG  capsule Take 20 mg by mouth daily.    [provider]  ondansetron (ZOFRAN) 4 MG tablet Take 1 tablet (4 mg total) by mouth every 4 (four) hours as needed for nausea or vomiting. 08/14/20   MaGeorgette ShellMD  tamsulosin (FLOMAX) 0.4 MG CAPS capsule TAKE 1 CAPSULE(0.4 MG) BY MOUTH DAILY Patient taking differently: Take 0.4 mg by mouth daily.  05/30/18   DiOrlena SheldonPA-C  vitamin B-12 (CYANOCOBALAMIN) 500 MCG tablet Take 500 mcg by mouth daily.     [provider]    Allergies    Patient has no known allergies.  Review of Systems   Review of Systems  Constitutional: Negative for appetite change.  Respiratory: Negative for shortness of breath.   Musculoskeletal: Positive for neck pain.  Skin: Positive for wound.  Neurological: Negative for headaches.    Physical Exam Updated Vital Signs BP 133/77   Pulse 78   Temp 97.6 F (36.4 C) (Oral)   Ht 5' 10"  (1.778 m)   Wt 74.4 kg   SpO2 96%   BMI 23.53 kg/m   Physical Exam Vitals and nursing note reviewed.  HENT:     Head:     Comments: Approximately 5 mm laceration over bridge of nose.  Mild swelling.    Mouth/Throat:     Mouth: Mucous membranes are moist.  Eyes:     Extraocular Movements: Extraocular movements intact.  Neck:     Comments: No midline tenderness.  No deformity. Cardiovascular:     Rate and Rhythm: Normal rate and regular rhythm.  Pulmonary:     Breath sounds: No wheezing or rhonchi.  Abdominal:     General: There is no distension.  Musculoskeletal:        General: No tenderness.  Skin:    General: Skin is warm.     Capillary Refill: Capillary refill takes less than 2 seconds.  Neurological:     Mental Status: He is alert.     Comments: Awake and answers some questions but somewhat slow to answer.     ED Results / Procedures / Treatments   Labs (all labs ordered are listed, but only abnormal results are displayed) Labs Reviewed - No data to  display  EKG None  Radiology CT Head Wo Contrast  Result Date: 11/07/2020 CLINICAL DATA:  Trauma fell out of wheelchair nose abrasion EXAM: CT HEAD WITHOUT CONTRAST CT MAXILLOFACIAL WITHOUT CONTRAST CT CERVICAL SPINE WITHOUT CONTRAST TECHNIQUE: Multidetector CT imaging of the head, cervical spine, and maxillofacial structures were performed using the standard protocol without intravenous contrast. Multiplanar CT image reconstructions of the cervical spine and maxillofacial structures were also generated. COMPARISON:  CT brain and cervical spine 10/24/2020, 10/04/2020 CT brain 08/05/2020, 08/03/2020 FINDINGS: CT HEAD FINDINGS Brain: Patient is status post craniotomy for tumor debulking of right temporal occipital mass lesion. Suspected residual mass measuring 5.2 cm transverse on coronal images, by 3 cm. Mass appears to cross midline on coronal images. Interval development of small areas of acute appearing hemorrhage within the right temporal occipital white matter, measuring up to 14 mm suspicion of increased edema/white matter hypodensity since previous exam. 4 mm midline shift to the right does not appear significantly changed. Slight increased mass effect on the right lateral ventricle. The temporal horns are slightly more dilated. The fourth ventricle is slightly more dilated. There is increased hypodensity within the white matter surrounding the fourth ventricle. Mild atrophy. Chronic lacunar infarcts in the left thalamus and basal ganglia. Vascular: No hyperdense vessels.  Carotid vascular calcification Skull: Status post right posterior craniotomy.  No fracture Other: None CT MAXILLOFACIAL FINDINGS Osseous: Suspected acute mildly depressed left nasal bone fracture. Mandibular heads are normally position. No mandibular fracture. Trace left mastoid effusion. Orbits: Negative. No traumatic or inflammatory finding. Sinuses: Clear. Soft tissues: Negative. CT CERVICAL SPINE FINDINGS Alignment: Trace  anterolisthesis C6 on C7. Facet alignment is maintained Skull base and vertebrae: No acute fracture. No primary bone lesion or focal pathologic process. Soft tissues and spinal canal: No prevertebral fluid or swelling. No visible canal hematoma. Disc levels: Multiple level degenerative change with moderate disc space narrowing and osteophyte at C4 through C7. Facet degenerative changes at multiple levels with multiple level foraminal stenosis. Upper chest: Mild emphysema. Other: None IMPRESSION: 1. Status post prior right posterior craniotomy for tumor debulking of right temporal occipital mass lesion. Suspected residual mass measuring at least up to 5.2 cm, this appears to be enlarging compared with serial prior exams. Interval development of small areas of acute appearing hemorrhage within the right temporal occipital white matter, measuring up to 14 mm, adjacent to the mass lesion. 4 mm midline shift to the right does not appear significantly changed as compared with most recent prior but is slightly increased compared to more remote exams. Slight increased mass effect on right lateral ventricle with increased temporal horn and fourth ventricular dilatation compared to prior. 2. Suspected acute mildly depressed left nasal bone fracture. 3. No acute osseous abnormality of the cervical spine. Critical Value/emergent results were called by telephone at the time of interpretation on 11/07/2020 at 6:29 pm to provider [facility] , who verbally acknowledged these results. Electronically Signed   By: Donavan Foil M.D.   On: 11/07/2020 18:29   CT Cervical Spine Wo Contrast  Result Date: 11/07/2020 CLINICAL DATA:  Trauma fell out of wheelchair nose abrasion EXAM: CT HEAD WITHOUT CONTRAST CT MAXILLOFACIAL WITHOUT CONTRAST CT CERVICAL SPINE WITHOUT CONTRAST TECHNIQUE: Multidetector CT imaging of the head, cervical spine, and maxillofacial structures were performed using the standard protocol without intravenous  contrast. Multiplanar CT image reconstructions of the cervical spine and maxillofacial structures were also generated. COMPARISON:  CT brain and cervical spine 10/24/2020, 10/04/2020 CT brain 08/05/2020, 08/03/2020 FINDINGS: CT HEAD FINDINGS Brain: Patient is status post craniotomy for tumor debulking of right temporal occipital  mass lesion. Suspected residual mass measuring 5.2 cm transverse on coronal images, by 3 cm. Mass appears to cross midline on coronal images. Interval development of small areas of acute appearing hemorrhage within the right temporal occipital white matter, measuring up to 14 mm suspicion of increased edema/white matter hypodensity since previous exam. 4 mm midline shift to the right does not appear significantly changed. Slight increased mass effect on the right lateral ventricle. The temporal horns are slightly more dilated. The fourth ventricle is slightly more dilated. There is increased hypodensity within the white matter surrounding the fourth ventricle. Mild atrophy. Chronic lacunar infarcts in the left thalamus and basal ganglia. Vascular: No hyperdense vessels.  Carotid vascular calcification Skull: Status post right posterior craniotomy.  No fracture Other: None CT MAXILLOFACIAL FINDINGS Osseous: Suspected acute mildly depressed left nasal bone fracture. Mandibular heads are normally position. No mandibular fracture. Trace left mastoid effusion. Orbits: Negative. No traumatic or inflammatory finding. Sinuses: Clear. Soft tissues: Negative. CT CERVICAL SPINE FINDINGS Alignment: Trace anterolisthesis C6 on C7. Facet alignment is maintained Skull base and vertebrae: No acute fracture. No primary bone lesion or focal pathologic process. Soft tissues and spinal canal: No prevertebral fluid or swelling. No visible canal hematoma. Disc levels: Multiple level degenerative change with moderate disc space narrowing and osteophyte at C4 through C7. Facet degenerative changes at multiple  levels with multiple level foraminal stenosis. Upper chest: Mild emphysema. Other: None IMPRESSION: 1. Status post prior right posterior craniotomy for tumor debulking of right temporal occipital mass lesion. Suspected residual mass measuring at least up to 5.2 cm, this appears to be enlarging compared with serial prior exams. Interval development of small areas of acute appearing hemorrhage within the right temporal occipital white matter, measuring up to 14 mm, adjacent to the mass lesion. 4 mm midline shift to the right does not appear significantly changed as compared with most recent prior but is slightly increased compared to more remote exams. Slight increased mass effect on right lateral ventricle with increased temporal horn and fourth ventricular dilatation compared to prior. 2. Suspected acute mildly depressed left nasal bone fracture. 3. No acute osseous abnormality of the cervical spine. Critical Value/emergent results were called by telephone at the time of interpretation on 11/07/2020 at 6:29 pm to provider [facility] , who verbally acknowledged these results. Electronically Signed   By: Donavan Foil M.D.   On: 11/07/2020 18:29   CT Maxillofacial Wo Contrast  Result Date: 11/07/2020 CLINICAL DATA:  Trauma fell out of wheelchair nose abrasion EXAM: CT HEAD WITHOUT CONTRAST CT MAXILLOFACIAL WITHOUT CONTRAST CT CERVICAL SPINE WITHOUT CONTRAST TECHNIQUE: Multidetector CT imaging of the head, cervical spine, and maxillofacial structures were performed using the standard protocol without intravenous contrast. Multiplanar CT image reconstructions of the cervical spine and maxillofacial structures were also generated. COMPARISON:  CT brain and cervical spine 10/24/2020, 10/04/2020 CT brain 08/05/2020, 08/03/2020 FINDINGS: CT HEAD FINDINGS Brain: Patient is status post craniotomy for tumor debulking of right temporal occipital mass lesion. Suspected residual mass measuring 5.2 cm transverse on coronal  images, by 3 cm. Mass appears to cross midline on coronal images. Interval development of small areas of acute appearing hemorrhage within the right temporal occipital white matter, measuring up to 14 mm suspicion of increased edema/white matter hypodensity since previous exam. 4 mm midline shift to the right does not appear significantly changed. Slight increased mass effect on the right lateral ventricle. The temporal horns are slightly more dilated. The fourth ventricle is slightly more  dilated. There is increased hypodensity within the white matter surrounding the fourth ventricle. Mild atrophy. Chronic lacunar infarcts in the left thalamus and basal ganglia. Vascular: No hyperdense vessels.  Carotid vascular calcification Skull: Status post right posterior craniotomy.  No fracture Other: None CT MAXILLOFACIAL FINDINGS Osseous: Suspected acute mildly depressed left nasal bone fracture. Mandibular heads are normally position. No mandibular fracture. Trace left mastoid effusion. Orbits: Negative. No traumatic or inflammatory finding. Sinuses: Clear. Soft tissues: Negative. CT CERVICAL SPINE FINDINGS Alignment: Trace anterolisthesis C6 on C7. Facet alignment is maintained Skull base and vertebrae: No acute fracture. No primary bone lesion or focal pathologic process. Soft tissues and spinal canal: No prevertebral fluid or swelling. No visible canal hematoma. Disc levels: Multiple level degenerative change with moderate disc space narrowing and osteophyte at C4 through C7. Facet degenerative changes at multiple levels with multiple level foraminal stenosis. Upper chest: Mild emphysema. Other: None IMPRESSION: 1. Status post prior right posterior craniotomy for tumor debulking of right temporal occipital mass lesion. Suspected residual mass measuring at least up to 5.2 cm, this appears to be enlarging compared with serial prior exams. Interval development of small areas of acute appearing hemorrhage within the right  temporal occipital white matter, measuring up to 14 mm, adjacent to the mass lesion. 4 mm midline shift to the right does not appear significantly changed as compared with most recent prior but is slightly increased compared to more remote exams. Slight increased mass effect on right lateral ventricle with increased temporal horn and fourth ventricular dilatation compared to prior. 2. Suspected acute mildly depressed left nasal bone fracture. 3. No acute osseous abnormality of the cervical spine. Critical Value/emergent results were called by telephone at the time of interpretation on 11/07/2020 at 6:29 pm to provider [facility] , who verbally acknowledged these results. Electronically Signed   By: Donavan Foil M.D.   On: 11/07/2020 18:29    Procedures Procedures (including critical care time)  Medications Ordered in ED Medications - No data to display  ED Course  I have reviewed the triage vital signs and the nursing notes.  Pertinent labs & imaging results that were available during my care of the patient were reviewed by me and considered in my medical decision making (see chart for details).    MDM Rules/Calculators/A&P                          Patient with fall.  Abrasion to nose with small laceration.  Thinks local wound care would be enough.  Cannot provide a lot of history but head CT done and shows intracranial hemorrhage.  Likely bleeding or tumor.  Discussed with patient sister.  In reviewing previous notes it appears that patient does not want further intervention and that there is really not anything that could be done with the tumor, much less the bleed.  Sounds like there is plans for hospice but may not of it set up by nursing home yet.Marland Kitchen  Hopefully hospice can be arranged.  Will discharge home. Also small nasal fracture that does not appear to need repair at this time Final Clinical Impression(s) / ED Diagnoses Final diagnoses:  Fall, initial encounter  Intracranial  hematoma without loss of consciousness, initial encounter [facility])    Rx / DC Orders ED Discharge Orders    None       Davonna Belling, MD 11/07/20 2125    Davonna Belling, MD 11/07/20 2126

## 2020-11-16 ENCOUNTER — Ambulatory Visit (INDEPENDENT_AMBULATORY_CARE_PROVIDER_SITE_OTHER): Payer: Medicare HMO | Admitting: Cardiology

## 2020-11-16 ENCOUNTER — Encounter: Payer: Self-pay | Admitting: Cardiology

## 2020-11-16 ENCOUNTER — Other Ambulatory Visit: Payer: Self-pay

## 2020-11-16 DIAGNOSIS — I442 Atrioventricular block, complete: Secondary | ICD-10-CM | POA: Diagnosis not present

## 2020-11-16 NOTE — Progress Notes (Signed)
Electrophysiology Office Note   Date:  11/16/2020   ID:  Francisco [name], Francisco [name] [birth year], MRN [id number]  PCP:  Caprice Renshaw, MD  Primary Electrophysiologist:  Constance Haw, MD    No chief complaint on file.    History of Present Illness: Francisco [name] is a 75 y.o. male who presents today for electrophysiology evaluation.      He has a history of COPD and hypertension.  He presented to the hospital September 2016 for the evaluation of seizures.  He was found to be in complete heart block with a ventricular rate in the 30s and altered mental status requiring transcutaneous pacing.  He is now status post Medtronic dual-chamber pacemaker implanted 07/13/2015.  I had a discussion with the patient's long-term care facility.  The patient is currently at his baseline.  He is noncommunicative.  He does not appear that he is understanding of what we are seeing him, and he is noncommunicative with [place].  We were unable to check her blood pressure today.  He is unable to provide a history.    Past Medical History:  Diagnosis Date  . Anemia   . Anginal pain ([place])    normal coronaries 2016  . Celiac disease   . CHF (congestive heart failure) ([place])   . COPD (chronic obstructive pulmonary disease) ([place])   . GERD (gastroesophageal reflux disease)   . Heart murmur   . History of radiation therapy 10/07/18- 11/04/18   Parotid, left 50 Gy in 20 fractions of 2.5 Gy.   Marland Kitchen Hypertension   . Multiple duodenal ulcers   . parotid gland cancer Left   . Presence of permanent cardiac pacemaker   . Right internal carotid occlusion   . Seizures ([place])   . Shortness of breath   . Symptomatic Bradycardia    a. 07/2015 s/p MDT Advisa L DC PPM (Ser #: [id number]).  . 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 APPLICATION OF CRANIAL NAVIGATION Right 08/04/2020   Procedure: APPLICATION OF CRANIAL NAVIGATION;  Surgeon: Vallarie Mare, MD;  Location: [facility];  Service: Neurosurgery;  Laterality: Right;  . CARDIAC CATHETERIZATION N/A 07/08/2015   Procedure: Left Heart Cath and Coronary Angiography;  Surgeon: Troy Sine, MD;  Location: [facility];  Service: Cardiovascular;  Laterality: N/A;  . CARDIAC CATHETERIZATION N/A 07/08/2015   Procedure: Temporary Pacemaker;  Surgeon: Troy Sine, MD;  Location: [facility];  Service: Cardiovascular;  Laterality: N/A;  . CARDIAC CATHETERIZATION N/A 07/09/2015   Procedure: Temporary Pacemaker;  Surgeon: Troy Sine, MD;  Location: [facility];  Service: Cardiovascular;  Laterality: N/A;  . CRANIOTOMY Right 08/04/2020   Procedure: RIGHT PARIETAL OCCIPITAL CRANIOTOMY TUMOR EXCISION;  Surgeon: Vallarie Mare, MD;  Location: [facility];  Service: Neurosurgery;  Laterality: Right;  . EP IMPLANTABLE DEVICE N/A 07/09/2015   Procedure: Pacemaker Implant;  Surgeon: Sayer Masini Meredith Leeds, MD;  Location: [facility];  Service: Cardiovascular;  Laterality: N/A;  . ESOPHAGOGASTRODUODENOSCOPY  07/03/2012   Procedure: ESOPHAGOGASTRODUODENOSCOPY (EGD);  Surgeon: Winfield Cunas., MD;  Location: [facility] ENDOSCOPY;  Service: Endoscopy;  Laterality: N/A;  . HEMORRHOID SURGERY    . HERNIA REPAIR    . MOLE REMOVAL    . PAROTIDECTOMY Left 08/21/2018   superficial  . PAROTIDECTOMY Left 08/21/2018   Procedure: LEFT SUPERFICIAL PAROTIDECTOMY;  Surgeon: Helayne Seminole, MD;  Location: [facility]  OR;  Service: ENT;  Laterality: Left;     Current Outpatient Medications  Medication Sig Dispense Refill  . allopurinol (ZYLOPRIM) 300 MG tablet TAKE 1 TABLET(300 MG) BY MOUTH DAILY 90 tablet 0  . atorvastatin (LIPITOR) 20 MG tablet Take 1 tablet (20 mg total) by mouth daily. 90 tablet 3  . colchicine 0.6 MG tablet Take 1 tablet (0.6 mg total) by mouth 2 (two) times daily as needed (gout flare). Take twice daily for 2 weeks, then use as needed for gout flare up 60 tablet 0  .  guaiFENesin (MUCINEX) 600 MG 12 hr tablet Take 600-1,200 mg by mouth every 12 (twelve) hours as needed for cough.     Marland Kitchen guaiFENesin (ROBITUSSIN) 100 MG/5ML liquid Take 200 mg by mouth every 6 (six) hours as needed for cough.     . levETIRAcetam (KEPPRA) 750 MG tablet Take 1 tablet (750 mg total) by mouth 2 (two) times daily. 60 tablet 2  . lidocaine (XYLOCAINE) 2 % solution Patient: Mix 1part 2% viscous lidocaine, 1part H20. Swish & swallow 29m of diluted mixture, 21m before meals and at bedtime, up to QID 200 mL 3  . LORazepam (ATIVAN) 0.5 MG tablet Take 1 tablet (0.5 mg total) by mouth every 8 (eight) hours as needed. 6 tablet 0  . magnesium hydroxide (MILK OF MAGNESIA) 400 MG/5ML suspension Take 30 mLs by mouth at bedtime as needed for mild constipation.    . metroNIDAZOLE (FLAGYL) 500 MG tablet Take 1 tablet (500 mg total) by mouth every 8 (eight) hours. X 7 days 21 tablet 0  . naproxen sodium (ALEVE) 220 MG tablet Take 220 mg by mouth every 12 (twelve) hours as needed (joint pain).    . Marland Kitchenmeprazole (PRILOSEC) 20 MG capsule Take 20 mg by mouth daily.    . tamsulosin (FLOMAX) 0.4 MG CAPS capsule TAKE 1 CAPSULE(0.4 MG) BY MOUTH DAILY 90 capsule 0  . vitamin B-12 (CYANOCOBALAMIN) 500 MCG tablet Take 500 mcg by mouth daily.     . divalproex (DEPAKOTE) 125 MG DR tablet Take 1 tablet by mouth 2 (two) times daily.     No current facility-administered medications for this visit.    Allergies:   Patient has no known allergies.   Social History:  The patient  reports that he has been smoking cigarettes. He has a 12.50 pack-year smoking history. He uses smokeless tobacco. He reports previous alcohol use. He reports that he does not use drugs.   Family History:  The patient's family history includes Hodgkin's lymphoma in his father.   ROS:  Please see the history of present illness.   Otherwise, review of systems is positive for none.   All other systems are reviewed and negative.   PHYSICAL  EXAM: VS:  There were no vitals taken for this visit. , BMI There is no height or weight on file to calculate BMI. GEN: Well nourished, well developed, in no acute distress  HEENT: normal  Neck: no JVD, carotid bruits, or masses Cardiac: RRR; no murmurs, rubs, or gallops,no edema  Respiratory:  clear to auscultation bilaterally, normal work of breathing GI: soft, nontender, nondistended, + BS MS: no deformity or atrophy  Skin: warm and dry, device site well healed Neuro:  Strength and sensation are intact Psych: euthymic mood, full affect  EKG:  EKG is not ordered today. Personal review of the ekg ordered 10/23/20 shows sinus rhythm, ventricular paced, PVCs  Personal review of the device interrogation today. Results in [facility]  Recent Labs: 09/12/2020: Magnesium 1.8 09/13/2020: ALT 12; Platelets 323 10/24/2020: BUN 20; Creatinine, Ser 0.70; Hemoglobin 13.3; Potassium 3.6; Sodium 143    Lipid Panel     Component Value Date/Time   CHOL 154 02/22/2017 0944   TRIG 111 04/04/2019 1114   HDL 69 02/22/2017 0944   CHOLHDL 2.2 02/22/2017 0944   VLDL 13 02/22/2017 0944   LDLCALC 72 02/22/2017 0944     Wt Readings from Last 3 Encounters:  11/07/20 164 lb (74.4 kg)  10/04/20 164 lb 14.5 oz (74.8 kg)  09/10/20 165 lb (74.8 kg)      Other studies Reviewed: Additional studies/ records that were reviewed today include: 07/08/15 Cath Review of the above records today demonstrates:  Complete heart block with ventricular rates in the low 30s requiring insertion of a transvenous pacemaker.  Normal coronary arteries.  Hyperdynamic LV function with an ejection fraction of a aproximately 65%.    ASSESSMENT AND PLAN:  1. complete heart block: Status post Medtronic dual-chamber pacemaker implanted 07/13/2015.  Device functioning appropriately.  No changes.  He has had some short runs of ventricular tachycardia.  Due to his mental status, Minah Axelrod not make any further changes.  2.  hypertension: Currently well controlled.  No changes.  Current medicines are reviewed at length with the patient today.   The patient does not have concerns regarding his medicines.  The following changes were made today: None  Labs/ tests ordered today include:  No orders of the defined types were placed in this encounter.    Disposition:   FU with Huie Ghuman 12 months  Signed, Canon Gola Meredith Leeds, MD  11/16/2020 3:25 PM     [facility] [address] [place] [address] [phone] (office) [phone] (fax)

## 2021-03-08 ENCOUNTER — Ambulatory Visit: Payer: Self-pay | Admitting: Radiation Oncology

## 2022-04-18 ENCOUNTER — Encounter: Payer: Self-pay | Admitting: Cardiology
